# Patient Record
Sex: Female | Born: 1935
Health system: Southern US, Community
[De-identification: ages and names within clinical notes are randomized; demographics above are authoritative.]

## PROBLEM LIST (undated history)

## (undated) DIAGNOSIS — E785 Hyperlipidemia, unspecified: Secondary | ICD-10-CM

## (undated) DIAGNOSIS — M1990 Unspecified osteoarthritis, unspecified site: Secondary | ICD-10-CM

## (undated) DIAGNOSIS — I4891 Unspecified atrial fibrillation: Secondary | ICD-10-CM

## (undated) DIAGNOSIS — D179 Benign lipomatous neoplasm, unspecified: Secondary | ICD-10-CM

## (undated) DIAGNOSIS — M25559 Pain in unspecified hip: Secondary | ICD-10-CM

## (undated) DIAGNOSIS — Z9889 Other specified postprocedural states: Secondary | ICD-10-CM

## (undated) DIAGNOSIS — R011 Cardiac murmur, unspecified: Secondary | ICD-10-CM

## (undated) DIAGNOSIS — J189 Pneumonia, unspecified organism: Secondary | ICD-10-CM

## (undated) DIAGNOSIS — R112 Nausea with vomiting, unspecified: Secondary | ICD-10-CM

## (undated) DIAGNOSIS — C801 Malignant (primary) neoplasm, unspecified: Secondary | ICD-10-CM

## (undated) DIAGNOSIS — C50919 Malignant neoplasm of unspecified site of unspecified female breast: Secondary | ICD-10-CM

## (undated) DIAGNOSIS — R05 Cough: Secondary | ICD-10-CM

## (undated) DIAGNOSIS — M1611 Unilateral primary osteoarthritis, right hip: Secondary | ICD-10-CM

## (undated) DIAGNOSIS — I10 Essential (primary) hypertension: Secondary | ICD-10-CM

## (undated) DIAGNOSIS — M1712 Unilateral primary osteoarthritis, left knee: Secondary | ICD-10-CM

## (undated) DIAGNOSIS — K219 Gastro-esophageal reflux disease without esophagitis: Secondary | ICD-10-CM

## (undated) HISTORY — DX: Benign lipomatous neoplasm, unspecified: D17.9

## (undated) HISTORY — DX: Gastro-esophageal reflux disease without esophagitis: K21.9

## (undated) HISTORY — DX: Unspecified osteoarthritis, unspecified site: M19.90

## (undated) HISTORY — PX: OVARY SURGERY: SHX727

## (undated) HISTORY — PX: TYMPANOPLASTY: SHX33

## (undated) HISTORY — DX: Malignant neoplasm of unspecified site of unspecified female breast: C50.919

## (undated) HISTORY — DX: Hyperlipidemia, unspecified: E78.5

## (undated) HISTORY — PX: KNEE SURGERY: SHX244

## (undated) HISTORY — DX: Malignant (primary) neoplasm, unspecified: C80.1

---
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
## 1999-03-09 HISTORY — PX: EYE SURGERY: SHX253

## 2000-02-08 ENCOUNTER — Ambulatory Visit (HOSPITAL_COMMUNITY): Admission: RE | Admit: 2000-02-08 | Discharge: 2000-02-09 | Payer: Self-pay | Admitting: Ophthalmology

## 2000-02-08 ENCOUNTER — Encounter: Payer: Self-pay | Admitting: Ophthalmology

## 2002-05-25 ENCOUNTER — Other Ambulatory Visit: Admission: RE | Admit: 2002-05-25 | Discharge: 2002-05-25 | Payer: Self-pay | Admitting: *Deleted

## 2002-09-04 ENCOUNTER — Encounter: Payer: Self-pay | Admitting: Neurosurgery

## 2002-09-04 ENCOUNTER — Ambulatory Visit (HOSPITAL_COMMUNITY): Admission: RE | Admit: 2002-09-04 | Discharge: 2002-09-04 | Payer: Self-pay | Admitting: Neurosurgery

## 2002-10-10 ENCOUNTER — Ambulatory Visit (HOSPITAL_COMMUNITY): Admission: RE | Admit: 2002-10-10 | Discharge: 2002-10-10 | Payer: Self-pay | Admitting: *Deleted

## 2002-10-10 ENCOUNTER — Encounter (INDEPENDENT_AMBULATORY_CARE_PROVIDER_SITE_OTHER): Payer: Self-pay

## 2004-04-08 ENCOUNTER — Encounter: Admission: RE | Admit: 2004-04-08 | Discharge: 2004-04-08 | Payer: Self-pay | Admitting: Orthopedic Surgery

## 2004-04-22 ENCOUNTER — Encounter: Admission: RE | Admit: 2004-04-22 | Discharge: 2004-04-22 | Payer: Self-pay | Admitting: Orthopedic Surgery

## 2005-05-12 ENCOUNTER — Other Ambulatory Visit: Admission: RE | Admit: 2005-05-12 | Discharge: 2005-05-12 | Payer: Self-pay | Admitting: *Deleted

## 2007-04-26 ENCOUNTER — Encounter: Admission: RE | Admit: 2007-04-26 | Discharge: 2007-04-26 | Payer: Self-pay | Admitting: Family Medicine

## 2008-02-06 ENCOUNTER — Encounter: Admission: RE | Admit: 2008-02-06 | Discharge: 2008-02-06 | Payer: Self-pay | Admitting: Family Medicine

## 2008-06-14 ENCOUNTER — Other Ambulatory Visit: Admission: RE | Admit: 2008-06-14 | Discharge: 2008-06-14 | Payer: Self-pay | Admitting: Family Medicine

## 2009-03-08 HISTORY — PX: BREAST LUMPECTOMY: SHX2

## 2009-03-11 ENCOUNTER — Encounter: Admission: RE | Admit: 2009-03-11 | Discharge: 2009-03-11 | Payer: Self-pay | Admitting: Family Medicine

## 2009-03-14 ENCOUNTER — Encounter: Admission: RE | Admit: 2009-03-14 | Discharge: 2009-03-14 | Payer: Self-pay | Admitting: Family Medicine

## 2009-03-19 ENCOUNTER — Encounter: Admission: RE | Admit: 2009-03-19 | Discharge: 2009-03-19 | Payer: Self-pay | Admitting: Family Medicine

## 2009-03-25 ENCOUNTER — Encounter: Admission: RE | Admit: 2009-03-25 | Discharge: 2009-03-25 | Payer: Self-pay | Admitting: Family Medicine

## 2009-04-16 ENCOUNTER — Encounter: Payer: Self-pay | Admitting: Surgery

## 2009-04-22 ENCOUNTER — Encounter: Admission: RE | Admit: 2009-04-22 | Discharge: 2009-04-22 | Payer: Self-pay | Admitting: Surgery

## 2009-04-22 ENCOUNTER — Ambulatory Visit (HOSPITAL_COMMUNITY): Admission: RE | Admit: 2009-04-22 | Discharge: 2009-04-22 | Payer: Self-pay | Admitting: Surgery

## 2009-04-22 ENCOUNTER — Other Ambulatory Visit (INDEPENDENT_AMBULATORY_CARE_PROVIDER_SITE_OTHER): Payer: Self-pay | Admitting: Surgery

## 2009-04-22 HISTORY — PX: BREAST SURGERY: SHX581

## 2009-05-06 ENCOUNTER — Ambulatory Visit: Payer: Self-pay | Admitting: Oncology

## 2009-05-13 ENCOUNTER — Ambulatory Visit: Admission: RE | Admit: 2009-05-13 | Discharge: 2009-07-18 | Payer: Self-pay | Admitting: Radiation Oncology

## 2009-06-06 ENCOUNTER — Ambulatory Visit: Payer: Self-pay | Admitting: Oncology

## 2009-08-05 ENCOUNTER — Ambulatory Visit: Payer: Self-pay | Admitting: Oncology

## 2009-08-29 ENCOUNTER — Other Ambulatory Visit: Admission: RE | Admit: 2009-08-29 | Discharge: 2009-08-29 | Payer: Self-pay | Admitting: Family Medicine

## 2010-02-04 ENCOUNTER — Encounter: Admission: RE | Admit: 2010-02-04 | Discharge: 2010-02-04 | Payer: Self-pay | Admitting: Surgery

## 2010-03-12 ENCOUNTER — Encounter
Admission: RE | Admit: 2010-03-12 | Discharge: 2010-03-12 | Payer: Self-pay | Source: Home / Self Care | Attending: Oncology | Admitting: Oncology

## 2010-05-27 LAB — COMPREHENSIVE METABOLIC PANEL
AST: 28 U/L (ref 0–37)
Albumin: 3.9 g/dL (ref 3.5–5.2)
Alkaline Phosphatase: 57 U/L (ref 39–117)
BUN: 10 mg/dL (ref 6–23)
Calcium: 9 mg/dL (ref 8.4–10.5)
Chloride: 101 mEq/L (ref 96–112)
Creatinine, Ser: 0.6 mg/dL (ref 0.4–1.2)
GFR calc non Af Amer: >60 mL/min (ref >60)
Potassium: 3.2 mEq/L — ABNORMAL LOW (ref 3.5–5.1)
Sodium: 137 mEq/L (ref 135–145)

## 2010-05-27 LAB — CBC
HCT: 35.6 % — ABNORMAL LOW (ref 36.0–46.0)
Hemoglobin: 12.5 g/dL (ref 12.0–15.0)
MCHC: 35.2 g/dL (ref 30.0–36.0)
RBC: 4.02 MIL/uL (ref 3.87–5.11)

## 2010-05-27 LAB — DIFFERENTIAL
Lymphocytes Relative: 42 % (ref 12–46)
Neutro Abs: 3.8 10*3/uL (ref 1.7–7.7)

## 2010-07-08 ENCOUNTER — Other Ambulatory Visit: Payer: Self-pay | Admitting: Oncology

## 2010-07-08 ENCOUNTER — Encounter (HOSPITAL_BASED_OUTPATIENT_CLINIC_OR_DEPARTMENT_OTHER): Payer: Medicare Other | Admitting: Oncology

## 2010-07-08 DIAGNOSIS — C50519 Malignant neoplasm of lower-outer quadrant of unspecified female breast: Secondary | ICD-10-CM

## 2010-07-08 DIAGNOSIS — Z170 Estrogen receptor positive status [ER+]: Secondary | ICD-10-CM

## 2010-07-08 LAB — CBC WITH DIFFERENTIAL/PLATELET
HCT: 37.8 % (ref 34.8–46.6)
HGB: 13 g/dL (ref 11.6–15.9)
LYMPH%: 46 % (ref 14.0–49.7)
MCH: 30.3 pg (ref 25.1–34.0)
MCHC: 34.4 g/dL (ref 31.5–36.0)
MCV: 88.2 fL (ref 79.5–101.0)
MONO%: 8.9 % (ref 0.0–14.0)
Platelets: 222 10*3/uL (ref 145–400)
RBC: 4.29 10*6/uL (ref 3.70–5.45)
RDW: 12.9 % (ref 11.2–14.5)

## 2010-07-09 LAB — COMPREHENSIVE METABOLIC PANEL
ALT: 29 U/L (ref 0–35)
Albumin: 4.4 g/dL (ref 3.5–5.2)
CO2: 26 mEq/L (ref 19–32)
Calcium: 9.8 mg/dL (ref 8.4–10.5)
Chloride: 103 mEq/L (ref 96–112)
Creatinine, Ser: 0.56 mg/dL (ref 0.40–1.20)
Glucose, Bld: 114 mg/dL — ABNORMAL HIGH (ref 70–99)
Potassium: 3.6 mEq/L (ref 3.5–5.3)
Sodium: 141 mEq/L (ref 135–145)
Total Bilirubin: 1.2 mg/dL (ref 0.3–1.2)

## 2010-07-09 LAB — VITAMIN D 25 HYDROXY (VIT D DEFICIENCY, FRACTURES): Vit D, 25-Hydroxy: 22 ng/mL — ABNORMAL LOW (ref 30–89)

## 2010-07-15 ENCOUNTER — Other Ambulatory Visit: Payer: Self-pay | Admitting: Oncology

## 2010-07-15 ENCOUNTER — Encounter (HOSPITAL_BASED_OUTPATIENT_CLINIC_OR_DEPARTMENT_OTHER): Payer: Medicare Other | Admitting: Oncology

## 2010-07-15 DIAGNOSIS — Z9889 Other specified postprocedural states: Secondary | ICD-10-CM

## 2010-07-15 DIAGNOSIS — C50519 Malignant neoplasm of lower-outer quadrant of unspecified female breast: Secondary | ICD-10-CM

## 2010-07-15 DIAGNOSIS — C50911 Malignant neoplasm of unspecified site of right female breast: Secondary | ICD-10-CM

## 2010-07-24 NOTE — Op Note (Signed)
[place]  Patient:    Sheila Gardner, Sheila Gardner                        MRN: [id number] Proc. Date: 02/08/00 Adm. Date:  [id number] Attending:  Ladona Ridgel                           Operative Report  PREOPERATIVE DIAGNOSIS:  Idiopathic macular hole, right eye.  POSTOPERATIVE DIAGNOSIS:  Idiopathic macular hole, right eye.  PROCEDURE: 1. Pars plana vitrectomy and membrane peeling. 2. Autologous serum patch. 3. Gas-fluid exchange with 16% C3F8 prophylactic. 4. Laser for retinal break, right eye.  SURGEON:  Ladona Ridgel, M.D.  ASSISTANT:  Ulysees Barns.  ANESTHESIA:  General endotracheal.  ESTIMATED BLOOD LOSS:  Less than 1 cc.  COMPLICATIONS:  None.  DESCRIPTION OF PROCEDURE:  The patient was taken to the operating room, where after induction of general anesthesia, the right eye was prepped and draped in the usual fashion.  A lid speculum was introduced and conjunctival peritomy was developed temporally and supranasally with relaxing incisions at 9 and 1. Hemostasis was obtained with eraser cautery.  Sclerotomies were fashioned at 3.75 mm posterior to the limbus at 1:30, 10:30 and 8.  The superior sclerotomies were plugged and a 4 mm infusion cannula was secured at the 8 oclock sclerotomy with a temporary suture of 7-0 Vicryl.  The tip was visually inspected and found to be in good position.  Landers ring was secured to the globe with 7-0 Vicryl sutures at 3 and 9.  The plugs were removed and a 30-degree Prismatic lens was applied to the surface of the eye.  A central core, followed by a peripheral vitrectomy was performed.  There was no posterior eyelid separation.  Using the suction only mode, the posterior hyaline was engaged and stripped from the posterior segment.  The peripheral vitrectomy was then completed at 360 degrees.  Magnifying prismatic lens was applied to the surface of the eye.  After verification that the  posterior hyaline had been stripped, the MVR was used to make a small opening to the internal lamellar membrane superiorly and temporally to the fovea.  The edge of the membrane was then elevated with Rice pick, and then peeled off the macula and rectus; the membrane being removed in two fragments.  Faint retinal whitening was seen 360 degrees surrounding the small central macular hole. The instrument was removed from the eye and the hole was plugged.  Landers ring and lens were removed.  Inspection with the indirect ophthalmoscope with scleral depression revealed the retina to be flat, with a small, cystic, tufted area of potential retinal break approximately at 3 oclock.  This was surrounded with the indirect laser.  A gas-fluid exchange was then performed with #4 ______ ______ catheter and then head scope for visualization.  The superior and temporal sclerotomy was then replugged.  The autologous serum patch was drawn through the usual fashion, passing through a 0.22 micron filter, followed by a filter needle into a 1 cc syringe for later use.  The gas was drawn up in the usual sterile fashion, diluted to approximately 165 CF38.  A biconcave lens was then reapplied to the surface of the eye and the fluid then filled and the posterior segment was removed.  Two drops of paste autologous serum were then placed over the macula.  The supranasal sclerotomy and its  perimetry was closed.  Then 20 cc of the gas was infused with a fusion cannula, with egress to the supratemporal sclerotomy.  The supratemporal sclerotomy was closed.  The pressure was checked at approximately 21 mmHg. The Barraquer tonometer and infusion cannula were removed.  The preplaced sutures were secured.  Pressure was rechecked and found to be unchanged.  The conjuctivae was then drawn up and reapproximated with interrupted running sutures of 6-0 plain gut.  The subconjunctival space was irrigated with 0.75% Marcaine,  followed by subconjunctival injection of 100 mg ceftazidime and 10 mg Decadron.  The lid speculum was then removed and next the antibiotic ointment applied to surface of the eye.  Eye patch and shield were next placed over the patients right eye.  Following awakening from general anesthesia, the patient left the operating room in stable condition DD:  02/08/00 TD:  02/09/00 Job: [id number]

## 2010-08-20 ENCOUNTER — Ambulatory Visit
Admission: RE | Admit: 2010-08-20 | Discharge: 2010-08-20 | Disposition: A | Discharge status: Home / Self Care | Payer: Medicare Other | Source: Ambulatory Visit | Attending: Gastroenterology | Admitting: Gastroenterology

## 2010-08-20 ENCOUNTER — Other Ambulatory Visit: Payer: Self-pay | Admitting: Gastroenterology

## 2010-08-20 DIAGNOSIS — M545 Low back pain, unspecified: Secondary | ICD-10-CM

## 2010-08-24 ENCOUNTER — Encounter (HOSPITAL_BASED_OUTPATIENT_CLINIC_OR_DEPARTMENT_OTHER): Payer: Medicare Other | Admitting: Oncology

## 2010-08-24 DIAGNOSIS — M810 Age-related osteoporosis without current pathological fracture: Secondary | ICD-10-CM

## 2010-08-24 DIAGNOSIS — Z170 Estrogen receptor positive status [ER+]: Secondary | ICD-10-CM

## 2010-08-24 DIAGNOSIS — C50919 Malignant neoplasm of unspecified site of unspecified female breast: Secondary | ICD-10-CM

## 2010-10-27 ENCOUNTER — Other Ambulatory Visit: Payer: Self-pay | Admitting: Physician Assistant

## 2010-12-28 ENCOUNTER — Encounter (INDEPENDENT_AMBULATORY_CARE_PROVIDER_SITE_OTHER): Payer: Self-pay | Admitting: Surgery

## 2010-12-30 ENCOUNTER — Encounter (INDEPENDENT_AMBULATORY_CARE_PROVIDER_SITE_OTHER): Payer: Self-pay | Admitting: Surgery

## 2010-12-30 ENCOUNTER — Ambulatory Visit (INDEPENDENT_AMBULATORY_CARE_PROVIDER_SITE_OTHER): Payer: Medicare Other | Admitting: Surgery

## 2010-12-30 VITALS — BP 150/90 | HR 64 | Temp 97.1°F | Resp 16 | Ht 64.5 in | Wt 188.4 lb

## 2010-12-30 DIAGNOSIS — C50411 Malignant neoplasm of upper-outer quadrant of right female breast: Secondary | ICD-10-CM | POA: Insufficient documentation

## 2010-12-30 DIAGNOSIS — Z853 Personal history of malignant neoplasm of breast: Secondary | ICD-10-CM | POA: Insufficient documentation

## 2010-12-30 NOTE — Progress Notes (Signed)
ASSESSMENT AND PLAN: 1.  Right breast cancer  T1, N0  Lumpectomy 04/22/2009.  No evidence of disease.  To see me back in 6 months.  2.  On Femara, which she is doing well with.  (She tried Arimidex, but could not tolerate it.) 3.  History of cardiomegaly by echo.  Stable cardiac status  HISTORY OF PRESENT ILLNESS: Chief Complaint  Patient presents with  . Breast Cancer Long Term Follow Up    Sheila Gardner is a 75 y.o. (DOB: [birth year])  white female who is a patient of Daisy Floro, MD and comes to me today for follow up breast cancer.  She spends a lot of time with her grand children.  IN good spirits.  No new mass or lump.  She is doing well with no complaints.  Doing well on the Femara.  She says she can't tell that she is taking it.  Path (side, TNM): Right, T1, N0. Surgery: Right lumpectomy/SLNBx   Date: 04/22/2009 Size of tumor: 0.8 cm Nodes: 0/3 ER: 100% PR: 100% Ki67: 10%  HER2Neu: Neg  Medical Oncologist: Magrinat  Radiation Oncologist: Michell Heinrich  (she did no undergo rad tx)  PHYSICAL EXAM: BP 150/90  Pulse 64  Temp(Src) 97.1 F (36.2 C) (Temporal)  Resp 16  Ht 5' 4.5" (1.638 m)  Wt 188 lb 6.4 oz (85.458 kg)  BMI 31.84 kg/m2  HEENT:  Pupils equal.  Dentition good.  No injury. NECK:  Supple.  No thyroid mass. LYMPH NODES:  No cervical, supraclavicular, or axillary adenopathy. BREASTS -  RIGHT:  Indention 7 to 8 o'clock position from lumpectomy.  No mass.   No nipple discharge.   LEFT:  No palpable mass or nodule.  No nipple discharge. UPPER EXTREMITIES:  No evidence of lymphedema.  DATA REVIEWED: Mammogram - 03/12/2010

## 2011-01-14 ENCOUNTER — Telehealth: Payer: Self-pay | Admitting: Oncology

## 2011-01-14 NOTE — Telephone Encounter (Signed)
called pt and informed her that her appt in dec2012 was cancelled.  r/s appt 03/19/2011 and 03/25/2011

## 2011-03-09 HISTORY — PX: ANKLE FRACTURE SURGERY: SHX122

## 2011-03-17 ENCOUNTER — Other Ambulatory Visit: Payer: Self-pay | Admitting: Oncology

## 2011-03-17 DIAGNOSIS — Z853 Personal history of malignant neoplasm of breast: Secondary | ICD-10-CM

## 2011-03-18 ENCOUNTER — Ambulatory Visit
Admission: RE | Admit: 2011-03-18 | Discharge: 2011-03-18 | Disposition: A | Discharge status: Home / Self Care | Payer: Medicare Other | Source: Ambulatory Visit | Attending: Oncology | Admitting: Oncology

## 2011-03-18 DIAGNOSIS — C50911 Malignant neoplasm of unspecified site of right female breast: Secondary | ICD-10-CM

## 2011-03-18 DIAGNOSIS — Z853 Personal history of malignant neoplasm of breast: Secondary | ICD-10-CM | POA: Diagnosis not present

## 2011-03-19 ENCOUNTER — Other Ambulatory Visit: Payer: Self-pay | Admitting: Oncology

## 2011-03-19 ENCOUNTER — Other Ambulatory Visit (HOSPITAL_BASED_OUTPATIENT_CLINIC_OR_DEPARTMENT_OTHER): Payer: Medicare Other | Admitting: Lab

## 2011-03-19 DIAGNOSIS — M810 Age-related osteoporosis without current pathological fracture: Secondary | ICD-10-CM | POA: Diagnosis not present

## 2011-03-19 DIAGNOSIS — C50519 Malignant neoplasm of lower-outer quadrant of unspecified female breast: Secondary | ICD-10-CM

## 2011-03-19 DIAGNOSIS — M205X9 Other deformities of toe(s) (acquired), unspecified foot: Secondary | ICD-10-CM | POA: Diagnosis not present

## 2011-03-19 DIAGNOSIS — Z170 Estrogen receptor positive status [ER+]: Secondary | ICD-10-CM

## 2011-03-19 DIAGNOSIS — E559 Vitamin D deficiency, unspecified: Secondary | ICD-10-CM | POA: Diagnosis not present

## 2011-03-19 LAB — CBC WITH DIFFERENTIAL/PLATELET
Basophils Absolute: 0.1 10*3/uL (ref 0.0–0.1)
HCT: 36.6 % (ref 34.8–46.6)
HGB: 12.6 g/dL (ref 11.6–15.9)
LYMPH%: 40.7 % (ref 14.0–49.7)
MCH: 30.3 pg (ref 25.1–34.0)
MCHC: 34.3 g/dL (ref 31.5–36.0)
MONO#: 0.6 10*3/uL (ref 0.1–0.9)
NEUT%: 47 % (ref 38.4–76.8)
Platelets: 255 10*3/uL (ref 145–400)
lymph#: 2.8 10*3/uL (ref 0.9–3.3)

## 2011-03-20 LAB — COMPREHENSIVE METABOLIC PANEL
ALT: 54 U/L — ABNORMAL HIGH (ref 0–35)
AST: 73 U/L — ABNORMAL HIGH (ref 0–37)
CO2: 28 mEq/L (ref 19–32)
Calcium: 9.8 mg/dL (ref 8.4–10.5)
Chloride: 102 mEq/L (ref 96–112)
Creatinine, Ser: 0.67 mg/dL (ref 0.50–1.10)
Sodium: 139 mEq/L (ref 135–145)
Total Protein: 7.3 g/dL (ref 6.0–8.3)

## 2011-03-22 ENCOUNTER — Ambulatory Visit
Admission: RE | Admit: 2011-03-22 | Discharge: 2011-03-22 | Disposition: A | Discharge status: Home / Self Care | Payer: Medicare Other | Source: Ambulatory Visit | Attending: Oncology | Admitting: Oncology

## 2011-03-22 DIAGNOSIS — Z853 Personal history of malignant neoplasm of breast: Secondary | ICD-10-CM | POA: Diagnosis not present

## 2011-03-22 MED ORDER — GADOBENATE DIMEGLUMINE 529 MG/ML IV SOLN
17.0000 mL | Freq: Once | INTRAVENOUS | Status: AC | PRN
  Administered 2011-03-22: 17 mL via INTRAVENOUS

## 2011-03-25 ENCOUNTER — Encounter: Payer: Self-pay | Admitting: Physician Assistant

## 2011-03-25 ENCOUNTER — Ambulatory Visit (HOSPITAL_BASED_OUTPATIENT_CLINIC_OR_DEPARTMENT_OTHER): Payer: Medicare Other | Admitting: Physician Assistant

## 2011-03-25 ENCOUNTER — Telehealth: Payer: Self-pay | Admitting: Oncology

## 2011-03-25 VITALS — BP 168/84 | HR 66 | Ht 64.0 in | Wt 185.5 lb

## 2011-03-25 DIAGNOSIS — R7989 Other specified abnormal findings of blood chemistry: Secondary | ICD-10-CM

## 2011-03-25 DIAGNOSIS — Z853 Personal history of malignant neoplasm of breast: Secondary | ICD-10-CM | POA: Diagnosis not present

## 2011-03-25 DIAGNOSIS — E559 Vitamin D deficiency, unspecified: Secondary | ICD-10-CM | POA: Insufficient documentation

## 2011-03-25 NOTE — Progress Notes (Signed)
Hematology and Oncology Follow Up Visit  Sheila Gardner [id number] 05-Feb-1936 76 y.o. 03/25/2011    HPI: Sheila Gardner had her routine screening mammography March 11, 2009 at [facility]. This showed a possible mass in the right breast.  She was recalled for additional studies of the right breast on January 7th.  This confirmed a small rounded mass with ill-defined margins, deep in the right breast, which was not palpable by Dr. Azucena Kuba.  Ultrasound showed it to be 8 mm macrolobulated and near a normal-appearing lymph node in the inferior axillary region.  The patient was brought back for ultrasound-guided biopsy on January 12th and this showed ([id number]) an invasive ductal carcinoma which appeared low grade.  The tumor was estrogen and progesterone receptor positive, both at 100% and it had a low MIB-1 at 10%.  There was no evidence of Her-2 amplification with a ratio by CISH of 1.16.    With this information, the patient was referred to Dr. Ezzard Standing and bilateral breast MRIs were obtained January 18th. They showed a solitary area of enhancement in the right breast, measuring up to 1.7 cm.  Otherwise, this was a negative study.  On February 16th, the patient underwent a right lumpectomy and sentinel lymph node biopsy and this showed ([id number]) an invasive ductal carcinoma which measured 8 mm with negative and ample margins and no lymphovascular invasion.  The tumor was Grade 1 and 0 of 3 sentinel lymph nodes were involved.    The patient decided against radiation therapy and will start on anastrozole in April 2011. She continued on anastrozole until may of 2012 and was discontinued secondary to joint pain. On letrozole since May of 2012, but better tolerance.    Interim History:   Sheila Gardner returns today accompanied by her husband for routine followup of her right breast carcinoma. Interval history is unremarkable and she's been doing quite well. They had a good holiday with her family, including their  13 grandchildren.  Physically Sheila Gardner really has very few complaints. Her biggest complaint is some increased fatigue. She denies any recent illnesses and has had no fevers, chills, or night sweats. She continues to tolerate the letrozole well. Chest some occasional joint pain, primarily in her knees, but this is definitely improved since discontinuing the anastrozole last year. She has no significant hot flashes. No problems with vaginal dryness or vaginal bleeding.  Sheila Gardner is up-to-date with her health maintenance including screening colonoscopy in 2012, and routine physical.  A detailed review of systems is otherwise noncontributory as noted below.  Review of Systems: Constitutional:  Fatigue, no weight loss, fever, night sweats Eyes: negative [id number] Cardiovascular: no chest pain or dyspnea on exertion Respiratory: no cough, shortness of breath, or wheezing Neurological: negative Dermatological: negative Gastrointestinal: no abdominal pain, change in bowel habits, or black or bloody stools Genito-Urinary: no dysuria, trouble voiding, or hematuria Hematological and Lymphatic: negative Breast: negative Musculoskeletal: positive for - joint pain, mild in lower extremities Remaining ROS negative.  PAST MEDICAL HISTORY:  Significant for history of diverticulosis, history of hypercholesterolemia, history of occasional to frequent headaches which by her description are not migraines, history of remote removal of a thyroid mass, history of removal of a colon polyp August of 2004 under Dr. Roosvelt Harps.  History of degenerative disk disease particularly involving the neck.  History of eardrum replacement about 39 years ago.  History of lens implants for cataracts under Blima Ledger.  History of partial ovarian resection bilaterally in 1965.  She had repair  of a macular hole in the right eye in December of 2001 under Jonita Albee Harriott.    FAMILY HISTORY:  The patient's father died at the age  of 15 with dementia.  The patient's mother died at the age of 35 with heart disease.  The patient had one sister who died at the age of one year from a pulmonary problem, and a sister who died in an automobile accident at age 47.  A third sister survives at age 47.  The patient had 11 brothers.  One had pancreatic cancer and two had "brain cancer".  There is no breast or ovarian cancer in the immediate family, although there are some cousins with breast cancer, none diagnosed before the age of 39.    GYN HISTORY:  She is GX P2.  First pregnancy to term at age 73.  Went through the change of life at age 66.  She never took hormone replacement.    SOCIAL HISTORY:  She and her husband Baldo Ash, who is present today, run a Catering manager.  He has also done some construction work and her two sons, Vincenza Hews, 35, and Sheria Lang, who I believe is 9, are both in the building trade.  They both live in [place].  They have a daughter, Albin Felling, who helps with the Tupperware business and a daughter, Celine Mans, who works in [employer]. The patient has 13 grandchildren.  She attends the [employer].    Medications:   I have reviewed the patient's current medications.  Current Outpatient Prescriptions  Medication Sig Dispense Refill  . ASPIRIN PO Take by mouth as needed.        . cholecalciferol (VITAMIN D) 400 UNITS TABS Take 2,000 Units by mouth daily.       . Cyanocobalamin (VITAMIN B-12 PO) Take by mouth.        . cyclobenzaprine (FLEXERIL) 10 MG tablet Ad lib.      . fenofibrate 160 MG tablet 160 mg daily.       Marland Kitchen FLUOXETINE HCL PO Take 10 mg by mouth daily.        Marland Kitchen letrozole (FEMARA) 2.5 MG tablet daily.      Marland Kitchen omeprazole (PRILOSEC) 20 MG capsule daily.      . Pyridoxine HCl (VITAMIN B6 PO) Take by mouth.        . traMADol (ULTRAM) 50 MG tablet Ad lib.      Marland Kitchen FLAXSEED, LINSEED, PO Take by mouth.          Allergies: No Known Allergies  Physical Exam: Filed Vitals:   03/25/11 1117  BP: 168/84    Pulse: 66   HEENT:  Sclerae anicteric, conjunctivae pink.  Oropharynx clear.  No mucositis or candidiasis.   Nodes:  No cervical, supraclavicular, or axillary lymphadenopathy palpated.  Breast Exam:  Right breast, status post lumpectomy with well-healed incision. No suspicious nodularity, skin changes, or evidence of local recurrence. Left breast is benign with no masses, skin changes, or nipple inversion. Lungs:  Clear to auscultation bilaterally.  No crackles, rhonchi, or wheezes.   Heart:  Regular rate and rhythm.   Abdomen:  Soft, nontender.  Positive bowel sounds.  No organomegaly or masses palpated.   Musculoskeletal:  No focal spinal tenderness to palpation.  Extremities:  Benign.  No peripheral edema or cyanosis.   Skin:  Benign.   Neuro:  Nonfocal.   Lab Results: Lab Results  Component Value Date   WBC 6.8 03/19/2011   HGB 12.6 03/19/2011  HCT 36.6 03/19/2011   MCV 88.3 03/19/2011   PLT 255 03/19/2011   NEUTROABS 3.2 03/19/2011     Chemistry      Component Value Date/Time   NA 139 03/19/2011 1337   NA 139 03/19/2011 1337   K 3.6 03/19/2011 1337   K 3.6 03/19/2011 1337   CL 102 03/19/2011 1337   CL 102 03/19/2011 1337   CO2 28 03/19/2011 1337   CO2 28 03/19/2011 1337   BUN 14 03/19/2011 1337   BUN 14 03/19/2011 1337   CREATININE 0.67 03/19/2011 1337   CREATININE 0.67 03/19/2011 1337      Component Value Date/Time   CALCIUM 9.8 03/19/2011 1337   CALCIUM 9.8 03/19/2011 1337   ALKPHOS 60 03/19/2011 1337   ALKPHOS 60 03/19/2011 1337   AST 73* 03/19/2011 1337   AST 73* 03/19/2011 1337   ALT 54* 03/19/2011 1337   ALT 54* 03/19/2011 1337   BILITOT 0.8 03/19/2011 1337   BILITOT 0.8 03/19/2011 1337        Radiological Studies:  Mr Breast Bilateral W Wo Contrast  03/22/2011  *RADIOLOGY REPORT*  Clinical Data: 76 year old female with history of right breast cancer, lumpectomy and treatment in 2011.  Follow-up.  BILATERAL BREAST MRI WITH AND WITHOUT CONTRAST  Technique: Multiplanar,  multisequence MR images of both breasts were obtained prior to and following the intravenous administration of 17ml of Multihance.  Three dimensional images were evaluated at the independent DynaCad workstation.  Comparison:  [facility] and 03/25/2009 breast MRI  Findings: Mild normal background parenchymal enhancement is identified.  There are no abnormal areas enhancement within either breast. No abnormal appearing or enlarged lymph nodes are present.  Mild scarring changes within the outer right breast are identified.  No bony or upper hepatic abnormalities are present. Mild cardiomegaly is again noted.  IMPRESSION: No MR evidence of breast malignancy or metastatic disease.  Right breast scarring.  THREE-DIMENSIONAL MR IMAGE RENDERING ON INDEPENDENT WORKSTATION:  Three-dimensional MR images were rendered by post-processing of the original MR data on an independent workstation.  The three- dimensional MR images were interpreted, and findings were reported in the accompanying complete MRI report for this study.  BI-RADS CATEGORY 2:  Benign finding(s).  Recommend bilateral screening mammograms in 1 year. Consider bilateral breast MRI in 1 year as clinically indicated.  Original Report Authenticated By: Rosendo Gros, M.D.   Mm Digital Diagnostic Bilat  03/18/2011  ** RADIOLOGY REPORT **  Clinical Data:  76 year old female with history of right breast cancer, lumpectomy and treatment in 2011.  DIGITAL DIAGNOSTIC BILATERAL MAMMOGRAM with CAD  Comparison: 03/12/2010 and prior mammograms dating back to 04/26/2007  Findings: MLO and CC views of both breasts and a spot tangential view of the lumpectomy site performed. Minimal scarring in the right breast again identified. There is no evidence of mass, nonsurgical distortion, or suspicious calcifications. Scattered fibroglandular densities bilaterally are again noted.  Mammographic images were processed with CAD.  IMPRESSION: No specific  mammographic evidence of malignancy.  Right breast scarring.  These findings were discussed with the patient.  She was encouraged to begin/continue monthly self exams and to contact her primary physician if any changes noted.  BI-RADS CATEGORY 2:  Benign finding(s).  Recommend bilateral diagnostic mammograms in 1 year.  Original Report Authenticated By: Rosendo Gros, M.D.     Assessment:  A 76 year old [place] woman   (1)  status post right lumpectomy and sentinel lymph node dissection in  February 2011 for a T1b N0, grade 1 invasive ductal carcinoma.  Strongly ER/PR positive, HER2/neu negative, with low MIB-1.    (2)  Decided against radiation therapy.    (3)  On anastrozole from April 2011 until May 2012 with fair tolerance, but notable arthralgias when on the medication.  Now on letrozole since May 2012 with better tolerance.   Plan:  This case has been reviewed with Dr. Darnelle Catalan. Fortunately Darl Pikes recent mammogram and breast MRIs were all unremarkable. She will have her annual mammogram in 1 year, and is due for her next bone density test (which incidentally was normal in June of 2011) repeated in June of 2013.  We did notice, however, there was a significant increase in liver enzymes, up from 29 at her last visit here, to an AST of 73 and an ALT of 54 on 03/19/2011. She's had no previous liver imaging. The patient is extremely anxious about this increase, and is extremely concerned about her "cancer is growing". I did reassure her, but we will schedule her for a liver MRI in the next week or so, and I will see her sooner after to review those results.  Otherwise she will have her bone density in June as noted above, and we'll see Dr. Darnelle Catalan for her routine followup, including repeat labs, in July 2013. She'll continue on the letrozole which she is tolerating well.   This plan was reviewed with the patient, who voices understanding and agreement.  She knows to call with any changes or  problems.    Sora Vrooman, PA-C 03/25/2011

## 2011-03-25 NOTE — Telephone Encounter (Signed)
gve the pt her bone density/mri appt along with the jan,july 2013 appt calendars.

## 2011-03-28 ENCOUNTER — Other Ambulatory Visit: Payer: Self-pay | Admitting: Physician Assistant

## 2011-03-28 ENCOUNTER — Ambulatory Visit (HOSPITAL_COMMUNITY)
Admission: RE | Admit: 2011-03-28 | Discharge: 2011-03-28 | Disposition: A | Discharge status: Home / Self Care | Payer: Medicare Other | Source: Ambulatory Visit | Attending: Physician Assistant | Admitting: Physician Assistant

## 2011-03-28 DIAGNOSIS — Z853 Personal history of malignant neoplasm of breast: Secondary | ICD-10-CM | POA: Insufficient documentation

## 2011-03-28 DIAGNOSIS — R748 Abnormal levels of other serum enzymes: Secondary | ICD-10-CM | POA: Diagnosis not present

## 2011-03-28 DIAGNOSIS — K8020 Calculus of gallbladder without cholecystitis without obstruction: Secondary | ICD-10-CM | POA: Diagnosis not present

## 2011-03-28 DIAGNOSIS — Q619 Cystic kidney disease, unspecified: Secondary | ICD-10-CM | POA: Insufficient documentation

## 2011-03-28 DIAGNOSIS — K7689 Other specified diseases of liver: Secondary | ICD-10-CM | POA: Insufficient documentation

## 2011-03-28 DIAGNOSIS — N281 Cyst of kidney, acquired: Secondary | ICD-10-CM | POA: Diagnosis not present

## 2011-03-28 MED ORDER — GADOBENATE DIMEGLUMINE 529 MG/ML IV SOLN
17.0000 mL | Freq: Once | INTRAVENOUS | Status: AC | PRN
  Administered 2011-03-28: 17 mL via INTRAVENOUS

## 2011-03-30 ENCOUNTER — Telehealth: Payer: Self-pay | Admitting: Physician Assistant

## 2011-03-30 NOTE — Telephone Encounter (Signed)
Spoke with the patient today by phone to review results of recent MRI of the abdomen/liver. Reviewed the fact that there was no evidence of hepatic metastasis but there was hepatic steatosis. Also incidental findings of cholelithiasis which are asymptomatic.  Patient will return for routine followup in July 2013, but knows to call prior to that time with any changes. She does voice her understanding of the above during our conversation today.

## 2011-03-31 ENCOUNTER — Ambulatory Visit: Payer: Medicare Other | Admitting: Physician Assistant

## 2011-04-20 ENCOUNTER — Other Ambulatory Visit: Payer: Self-pay | Admitting: Dermatology

## 2011-04-20 DIAGNOSIS — D239 Other benign neoplasm of skin, unspecified: Secondary | ICD-10-CM | POA: Diagnosis not present

## 2011-04-20 DIAGNOSIS — D179 Benign lipomatous neoplasm, unspecified: Secondary | ICD-10-CM | POA: Diagnosis not present

## 2011-04-20 DIAGNOSIS — L821 Other seborrheic keratosis: Secondary | ICD-10-CM | POA: Diagnosis not present

## 2011-04-20 DIAGNOSIS — D485 Neoplasm of uncertain behavior of skin: Secondary | ICD-10-CM | POA: Diagnosis not present

## 2011-05-17 ENCOUNTER — Encounter (INDEPENDENT_AMBULATORY_CARE_PROVIDER_SITE_OTHER): Payer: Self-pay | Admitting: Surgery

## 2011-05-27 DIAGNOSIS — N899 Noninflammatory disorder of vagina, unspecified: Secondary | ICD-10-CM | POA: Diagnosis not present

## 2011-07-22 ENCOUNTER — Ambulatory Visit (INDEPENDENT_AMBULATORY_CARE_PROVIDER_SITE_OTHER): Payer: Medicare Other | Admitting: Surgery

## 2011-07-22 ENCOUNTER — Encounter (INDEPENDENT_AMBULATORY_CARE_PROVIDER_SITE_OTHER): Payer: Self-pay | Admitting: Surgery

## 2011-07-22 VITALS — BP 128/80 | HR 78 | Temp 97.6°F | Resp 16 | Ht 64.5 in | Wt 190.0 lb

## 2011-07-22 DIAGNOSIS — Z853 Personal history of malignant neoplasm of breast: Secondary | ICD-10-CM

## 2011-07-22 NOTE — Progress Notes (Signed)
Name:  Sheila Gardner MRN:  [id number] Date of birth: [birth year]  ASSESSMENT AND PLAN: 1.  Right breast cancer  T1, N0  Lumpectomy 04/22/2009.  No evidence of disease.    To see me back in 3 months to recheck her neck.  Then I will decide when to see her back for her breast.  2.  On Femara, which she is doing well with.  (She tried Arimidex, but could not tolerate it.) 3.  History of cardiomegaly by echo.  Stable cardiac status. 4.  Small mass, posterior right neck.  Probable lymph node.  (2.1 x 0.6 cm)  I did an [place] and took pictures.  She wants to see me back in 3 months to recheck this.  I will re ultrasound it at that time. 5.  Mildly elevated LFT's.  She's going to check with Dr. Tenny Craw.  She has a copy of her labs.  HISTORY OF PRESENT ILLNESS: Chief Complaint  Patient presents with  . Breast Cancer Long Term Follow Up    Br Recheck  R. Breast DOS:04/22/2009     Sheila Gardner is a 76 y.o. (DOB: [birth year])  white female who is a patient of Daisy Floro, MD, MD and comes to me today for follow up breast cancer.  She spends a lot of time with her grand children.  Doing well on the Femara.  She says she can't tell that she is taking it.  She had questions about her LFT's.  She has a mildly elevated AST and ALT.  I gave her copies of her reports.  And showed me a nodule on her right posterior neck.  She says she has had this mass for several years.  She is unsure whether it has enlarged.  Path (side, TNM): Right, T1, N0. Surgery: Right lumpectomy/SLNBx   Date: 04/22/2009 Size of tumor: 0.8 cm Nodes: 0/3 ER: 100% PR: 100% Ki67: 10%  HER2Neu: Neg  Medical Oncologist: Magrinat  Radiation Oncologist: Michell Heinrich  (she did not undergo rad tx)  PHYSICAL EXAM: BP 128/80  Pulse 78  Temp(Src) 97.6 F (36.4 C) (Temporal)  Resp 16  Ht 5' 4.5" (1.638 m)  Wt 190 lb (86.183 kg)  BMI 32.11 kg/m2  HEENT:  Pupils equal.  Dentition good.  No injury. NECK:  Supple.  No thyroid mass. She  has a 2 cm mass at the posterior base of her neck.  I don't think that she has showed this to me before, though she said she has had it several years. LYMPH NODES:  No cervical, supraclavicular, or axillary adenopathy.  Though I am not sure of the mass at the base of her right neck. BREASTS -  RIGHT:  Indention 7 to 8 o'clock position from lumpectomy.  No mass.   No nipple discharge.   LEFT:  No palpable mass or nodule.  No nipple discharge. UPPER EXTREMITIES:  No evidence of lymphedema.  PROCEDURE:  [place] of right posterior neck.  2.1 x 0.6 benign appearing mass, probable lymph node.  Pictures taken.  DATA REVIEWED: Breast MRI - 03/17/2011

## 2011-07-30 DIAGNOSIS — E782 Mixed hyperlipidemia: Secondary | ICD-10-CM | POA: Diagnosis not present

## 2011-07-30 DIAGNOSIS — G609 Hereditary and idiopathic neuropathy, unspecified: Secondary | ICD-10-CM | POA: Diagnosis not present

## 2011-07-30 DIAGNOSIS — F3289 Other specified depressive episodes: Secondary | ICD-10-CM | POA: Diagnosis not present

## 2011-07-30 DIAGNOSIS — F329 Major depressive disorder, single episode, unspecified: Secondary | ICD-10-CM | POA: Diagnosis not present

## 2011-07-30 DIAGNOSIS — Z79899 Other long term (current) drug therapy: Secondary | ICD-10-CM | POA: Diagnosis not present

## 2011-08-03 DIAGNOSIS — E782 Mixed hyperlipidemia: Secondary | ICD-10-CM | POA: Diagnosis not present

## 2011-08-03 DIAGNOSIS — Z79899 Other long term (current) drug therapy: Secondary | ICD-10-CM | POA: Diagnosis not present

## 2011-08-03 DIAGNOSIS — F329 Major depressive disorder, single episode, unspecified: Secondary | ICD-10-CM | POA: Diagnosis not present

## 2011-08-03 DIAGNOSIS — F3289 Other specified depressive episodes: Secondary | ICD-10-CM | POA: Diagnosis not present

## 2011-08-03 DIAGNOSIS — G5600 Carpal tunnel syndrome, unspecified upper limb: Secondary | ICD-10-CM | POA: Diagnosis not present

## 2011-08-09 DIAGNOSIS — Z853 Personal history of malignant neoplasm of breast: Secondary | ICD-10-CM | POA: Diagnosis not present

## 2011-08-09 DIAGNOSIS — Z13820 Encounter for screening for osteoporosis: Secondary | ICD-10-CM | POA: Diagnosis not present

## 2011-08-13 ENCOUNTER — Other Ambulatory Visit: Payer: Self-pay | Admitting: Physician Assistant

## 2011-08-13 DIAGNOSIS — C50519 Malignant neoplasm of lower-outer quadrant of unspecified female breast: Secondary | ICD-10-CM

## 2011-08-19 DIAGNOSIS — G5600 Carpal tunnel syndrome, unspecified upper limb: Secondary | ICD-10-CM | POA: Diagnosis not present

## 2011-08-19 DIAGNOSIS — H9319 Tinnitus, unspecified ear: Secondary | ICD-10-CM | POA: Diagnosis not present

## 2011-08-27 DIAGNOSIS — H6120 Impacted cerumen, unspecified ear: Secondary | ICD-10-CM | POA: Diagnosis not present

## 2011-08-27 DIAGNOSIS — H9319 Tinnitus, unspecified ear: Secondary | ICD-10-CM | POA: Diagnosis not present

## 2011-08-27 DIAGNOSIS — H902 Conductive hearing loss, unspecified: Secondary | ICD-10-CM | POA: Diagnosis not present

## 2011-08-31 ENCOUNTER — Telehealth: Payer: Self-pay | Admitting: *Deleted

## 2011-09-20 ENCOUNTER — Other Ambulatory Visit (HOSPITAL_BASED_OUTPATIENT_CLINIC_OR_DEPARTMENT_OTHER): Payer: Medicare Other | Admitting: Lab

## 2011-09-20 DIAGNOSIS — C50519 Malignant neoplasm of lower-outer quadrant of unspecified female breast: Secondary | ICD-10-CM

## 2011-09-20 DIAGNOSIS — Z853 Personal history of malignant neoplasm of breast: Secondary | ICD-10-CM

## 2011-09-20 DIAGNOSIS — M810 Age-related osteoporosis without current pathological fracture: Secondary | ICD-10-CM | POA: Diagnosis not present

## 2011-09-20 LAB — CBC WITH DIFFERENTIAL/PLATELET
Basophils Absolute: 0 10*3/uL (ref 0.0–0.1)
Eosinophils Absolute: 0.2 10*3/uL (ref 0.0–0.5)
HGB: 13.2 g/dL (ref 11.6–15.9)
MONO#: 0.5 10*3/uL (ref 0.1–0.9)
NEUT#: 1.8 10*3/uL (ref 1.5–6.5)
RBC: 4.47 10*6/uL (ref 3.70–5.45)
RDW: 13.1 % (ref 11.2–14.5)
WBC: 4.9 10*3/uL (ref 3.9–10.3)
lymph#: 2.5 10*3/uL (ref 0.9–3.3)
nRBC: 0 % (ref 0–0)

## 2011-09-21 LAB — COMPREHENSIVE METABOLIC PANEL
ALT: 68 U/L — ABNORMAL HIGH (ref 0–35)
Albumin: 4.2 g/dL (ref 3.5–5.2)
CO2: 28 mEq/L (ref 19–32)
Chloride: 103 mEq/L (ref 96–112)
Potassium: 4.2 mEq/L (ref 3.5–5.3)
Sodium: 141 mEq/L (ref 135–145)
Total Bilirubin: 0.6 mg/dL (ref 0.3–1.2)
Total Protein: 7 g/dL (ref 6.0–8.3)

## 2011-09-21 LAB — VITAMIN D 25 HYDROXY (VIT D DEFICIENCY, FRACTURES): Vit D, 25-Hydroxy: 49 ng/mL (ref 30–89)

## 2011-09-22 NOTE — Telephone Encounter (Signed)
No inquiry noted 

## 2011-09-29 ENCOUNTER — Ambulatory Visit (HOSPITAL_BASED_OUTPATIENT_CLINIC_OR_DEPARTMENT_OTHER): Payer: Medicare Other | Admitting: Oncology

## 2011-09-29 ENCOUNTER — Telehealth: Payer: Self-pay | Admitting: Oncology

## 2011-09-29 VITALS — BP 153/78 | HR 69 | Temp 97.7°F | Ht 64.5 in | Wt 188.8 lb

## 2011-09-29 DIAGNOSIS — Z853 Personal history of malignant neoplasm of breast: Secondary | ICD-10-CM

## 2011-09-29 NOTE — Progress Notes (Signed)
ID: Sheila Gardner   DOB: [birth year]  MR#: [id number]  NWG#:[id number]  HISTORY OF PRESENT ILLNESS: Sheila Gardner had her routine screening mammography March 11, 2009 at [facility]. This showed a possible mass in the right breast.  She was recalled for additional studies of the right breast on January 7th.  This confirmed a small rounded mass with ill-defined margins, deep in the right breast, which was not palpable by Dr. Azucena Gardner.  Ultrasound showed it to be 8 mm macrolobulated and near a normal-appearing lymph node in the inferior axillary region.  The patient was brought back for ultrasound-guided biopsy on January 12th and this showed ([id number]) an invasive ductal carcinoma which appeared low grade.  The tumor was estrogen and progesterone receptor positive, both at 100% and it had a low MIB-1 at 10%.  There was no evidence of Her-2 amplification with a ratio by CISH of 1.16.    With this information, the patient was referred to Dr. Ezzard Gardner and bilateral breast MRIs were obtained January 18th. They showed a solitary area of enhancement in the right breast, measuring up to 1.7 cm.  Otherwise, this was a negative study.  On February 16th, the patient underwent a right lumpectomy and sentinel lymph node biopsy and this showed ([id number]) an invasive ductal carcinoma which measured 8 mm with negative and ample margins and no lymphovascular invasion.  The tumor was Grade 1 and 0 of 3 sentinel lymph nodes were involved. Her subsequent history is as detailed below.  INTERVAL HISTORY: She returns today for followup of her breast cancer. She went off her letrozole about a month ago, because she again developed arthralgias and myalgias on this second try at an aromatase inhibitor. Now that she has been off a month she feels clearly better. She has more energy as well as your aches and pains.  REVIEW OF SYSTEMS: Some of her symptoms seem to me to be related to carpal tunnel issues and this was discussed today,  but there is no question that she will not tolerate aromatase inhibitors well. Aside from these issues, she's had some tinnitus and has been seeing Sheila Gardner for that. She is being treated with antibiotics and prednisone. A detailed review of systems was otherwise noncontributory  PAST MEDICAL HISTORY: Past Medical History  Diagnosis Date  . Hyperlipidemia   . GERD (gastroesophageal reflux disease)   . Cancer     Breast- rt  . Arthritis   Significant for history of diverticulosis, history of hypercholesterolemia, history of occasional to frequent headaches which by her description are not migraines, history of remote removal of a thyroid mass, history of removal of a colon polyp August of 2004 under Dr. Roosvelt Gardner.  History of degenerative disk disease particularly involving the neck.  History of eardrum replacement about 39 years ago.  History of lens implants for cataracts under Sheila Gardner.  History of partial ovarian resection bilaterally in 1965.  She had repair of a macular hole in the right eye in December of 2001 under Sheila Gardner.    PAST SURGICAL HISTORY: Past Surgical History  Procedure Date  . Tympanoplasty 30 years ago  . Ovary surgery 40 years ago    wedge  . Eye surgery 2001  . Breast surgery 04/22/2009    Rt lumpectomy    FAMILY HISTORY Family History  Problem Relation Age of Onset  . Cancer Brother     colon, pancreatic, brain  The patient's father died at the age of 53 with  dementia.  The patient's mother died at the age of 69 with heart disease.  The patient had one sister who died at the age of one year from a pulmonary problem, and a sister who died in an automobile accident at age 20.  A third sister survives at age 58.  The patient had 11 brothers.  One had pancreatic cancer and two had "brain cancer".  There is no breast or ovarian cancer in the immediate family, although there are some cousins with breast cancer, none diagnosed before the age of  31.    GYNECOLOGIC HISTORY: She is GX P2.  First pregnancy to term at age 47.  Went through the change of life at age 89.  She never took hormone replacement.    SOCIAL HISTORY: She and her husband Sheila Gardner, who is present today, ran a Catering manager.  He has also done some construction work and her two sons, Sheila Gardner and Sheila Gardner, are both in the building trade.  They both live in [place].  Sheila Gardner has a daughter, Sheila Gardner, who helps with the Tupperware business and a daughter, Sheila Gardner, who works in [employer]. The patient has 13 grandchildren.  She attends the [employer].     ADVANCED DIRECTIVES: Not in place  HEALTH MAINTENANCE: History  Substance Use Topics  . Smoking status: Never Smoker   . Smokeless tobacco: Not on file  . Alcohol Use: No     Colonoscopy:  PAP:  Bone density:  Lipid panel:  No Known Allergies  Current Outpatient Prescriptions  Medication Sig Dispense Refill  . ASPIRIN PO Take by mouth as needed.        . Cyanocobalamin (VITAMIN B-12 PO) Take by mouth.        . FLUOXETINE HCL PO Take 10 mg by mouth daily.        Marland Kitchen letrozole (FEMARA) 2.5 MG tablet TAKE ONE TABLET BY MOUTH EVERY DAY  30 tablet  1  . omeprazole (PRILOSEC) 20 MG capsule daily.      . Pyridoxine HCl (VITAMIN B6 PO) Take by mouth.        . traMADol (ULTRAM) 50 MG tablet Ad lib.      . cholecalciferol (VITAMIN D) 400 UNITS TABS Take 2,000 Units by mouth daily.       . cyclobenzaprine (FLEXERIL) 10 MG tablet Ad lib.      . fenofibrate 160 MG tablet 160 mg daily.       Marland Kitchen FLAXSEED, LINSEED, PO Take by mouth.          OBJECTIVE: Middle-aged white woman who appears well Filed Vitals:   09/29/11 1137  BP: 153/78  Pulse: 69  Temp: 97.7 F (36.5 C)     Body mass index is 31.91 kg/(m^2).    ECOG FS: 0  Sclerae unicteric Oropharynx clear No cervical or supraclavicular adenopathy Lungs no rales or rhonchi Heart regular rate and rhythm Abd benign MSK no focal spinal tenderness, no peripheral  edema Neuro: nonfocal Breasts: The right breast is status post lumpectomy. There is no evidence of local recurrence. The left breast is unremarkable. There is no axillary adenopathy bilaterally.  LAB RESULTS: Lab Results  Component Value Date   WBC 4.9 09/20/2011   NEUTROABS 1.8 09/20/2011   HGB 13.2 09/20/2011   HCT 38.1 09/20/2011   MCV 85.2 09/20/2011   PLT 207 09/20/2011      Chemistry      Component Value Date/Time   NA 141 09/20/2011 0948  K 4.2 09/20/2011 0948   CL 103 09/20/2011 0948   CO2 28 09/20/2011 0948   BUN 7 09/20/2011 0948   CREATININE 0.54 09/20/2011 0948      Component Value Date/Time   CALCIUM 9.6 09/20/2011 0948   ALKPHOS 84 09/20/2011 0948   AST 76* 09/20/2011 0948   ALT 68* 09/20/2011 0948   BILITOT 0.6 09/20/2011 0948       Lab Results  Component Value Date   LABCA2 20 09/20/2011    No components found with this basename: [id number]    No results found for this basename: INR:1;PROTIME:1 in the last 168 hours  Urinalysis No results found for this basename: colorurine, appearanceur, labspec, phurine, glucoseu, hgbur, bilirubinur, ketonesur, proteinur, urobilinogen, nitrite, leukocytesur    STUDIES: Mammography 03/18/2011 was unremarkable. Bone density at [facility] 08/09/2011 was normal  ASSESSMENT: 76 y.o. [place] woman status post right lumpectomy and sentinel lymph node dissection in February 2011 for a T1b N0, stage IA invasive ductal carcinoma, grade 1, strongly estrogen and progesterone receptor positive, HER2/neu negative, with a low MIB-1.  Decided against radiation therapy.  On anastrozole from April 2011 until May 2012 with arthralgias developing.  Started letrozole May 2012, stopped June 2013 again due to arthralgias/myalgias.  PLAN: Nikoleta has made to good faith runs an aromatase inhibitors, and cannot tolerate them. She is very reluctant to try tamoxifen, being acquainted with the possible complications, side effects and toxicities of that medication.  Since she had a T1b tumor, her overall prognosis is excellent. For that reason, even though she never received post lumpectomy radiation, I am not uncomfortable with foregoing any further antiestrogen therapy and proceeding with observation alone.  Since she has mammography in January, we will see her on a yearly basis in February. This has been set up. She knows to call for any other problems that may develop before the next visit.  MAGRINAT,GUSTAV C    09/29/2011

## 2011-09-29 NOTE — Telephone Encounter (Signed)
lmonvm adviisng the pt of her feb 2014 appts °

## 2011-10-08 ENCOUNTER — Ambulatory Visit (INDEPENDENT_AMBULATORY_CARE_PROVIDER_SITE_OTHER): Payer: Medicare Other | Admitting: Surgery

## 2011-10-08 ENCOUNTER — Encounter (INDEPENDENT_AMBULATORY_CARE_PROVIDER_SITE_OTHER): Payer: Self-pay | Admitting: Surgery

## 2011-10-08 VITALS — BP 126/78 | HR 68 | Temp 98.2°F | Resp 14 | Ht 64.0 in | Wt 191.6 lb

## 2011-10-08 DIAGNOSIS — Z853 Personal history of malignant neoplasm of breast: Secondary | ICD-10-CM | POA: Diagnosis not present

## 2011-10-08 NOTE — Progress Notes (Addendum)
Name:  Sheila Gardner MRN:  [id number] Date of birth: August 15, 1935  ASSESSMENT AND PLAN: 1.  Right breast cancer  T1, N0  Lumpectomy 04/22/2009.  No evidence of disease.    For follow up today for the right neck mass.  2.  She has stopped all anti estrogen meds.  She could not tolerate them. 3.  History of cardiomegaly by echo.  Stable cardiac status. 4.  Small mass, posterior right neck.  The patient comes back primarily for the lump on her right back neck.  I did an [place] and took pictures.  She thinks it may have enlarged, but by may exam, I don't think so.  But she is worried about it enough, we will plan to excise it under local anesthesia.  I discussed the surgery with her.  [Excised 10/19/2011 - lipoma.  DN  11/04/2101]  5.  Mildly elevated LFT's.  She has stopped all her meds.  They are still elevated.  HISTORY OF PRESENT ILLNESS: Chief Complaint  Patient presents with  . Routine Post Op    reck lipoma on back of neck    Sheila Gardner is a 76 y.o. (DOB: Oct 02, 1935)  white female who is a patient of ROSS,CHARLES ALAN, MD and comes to me today for follow up breast cancer and right neck mass.  The right neck mass she thinks is larger.  It is not causing pain or problems.  She spends a lot of time with her grand children.    Path (side, TNM): Right, T1, N0. Surgery: Right lumpectomy/SLNBx   Date: 04/22/2009 Size of tumor: 0.8 cm Nodes: 0/3 ER: 100% PR: 100% Ki67: 10%  HER2Neu: Neg  Medical Oncologist: Magrinat  Radiation Oncologist: Michell Heinrich  (she did not undergo rad tx)  PHYSICAL EXAM: BP 126/78  Pulse 68  Temp 98.2 F (36.8 C) (Temporal)  Resp 14  Ht 5\' 4"  (1.626 m)  Wt 191 lb 9.6 oz (86.909 kg)  BMI 32.89 kg/m2  HEENT:  Pupils equal.  Dentition good.  No injury. NECK:  Supple.  No thyroid mass. She has a 2 cm mass at the posterior base of her neck.   This has not changed from the last visit by my exam. LYMPH NODES:  No cervical, supraclavicular, or axillary  adenopathy.    PROCEDURE:  [place] of right posterior neck.  1.9 x 1.8 x 0.8 benign appearing mass.  Possible lipoma.  DATA REVIEWED: None new.

## 2011-10-19 ENCOUNTER — Other Ambulatory Visit (INDEPENDENT_AMBULATORY_CARE_PROVIDER_SITE_OTHER): Payer: Self-pay | Admitting: Surgery

## 2011-10-19 DIAGNOSIS — D1739 Benign lipomatous neoplasm of skin and subcutaneous tissue of other sites: Secondary | ICD-10-CM | POA: Diagnosis not present

## 2011-10-25 DIAGNOSIS — G5600 Carpal tunnel syndrome, unspecified upper limb: Secondary | ICD-10-CM | POA: Diagnosis not present

## 2011-10-29 ENCOUNTER — Telehealth (INDEPENDENT_AMBULATORY_CARE_PROVIDER_SITE_OTHER): Payer: Self-pay | Admitting: General Surgery

## 2011-10-29 NOTE — Telephone Encounter (Signed)
Pt called for path report:  Benign results shared.  Confirmed her post op appt on 11/05/11.

## 2011-11-03 DIAGNOSIS — R7401 Elevation of levels of liver transaminase levels: Secondary | ICD-10-CM | POA: Diagnosis not present

## 2011-11-03 DIAGNOSIS — R7402 Elevation of levels of lactic acid dehydrogenase (LDH): Secondary | ICD-10-CM | POA: Diagnosis not present

## 2011-11-03 DIAGNOSIS — E559 Vitamin D deficiency, unspecified: Secondary | ICD-10-CM | POA: Diagnosis not present

## 2011-11-03 DIAGNOSIS — L659 Nonscarring hair loss, unspecified: Secondary | ICD-10-CM | POA: Diagnosis not present

## 2011-11-05 ENCOUNTER — Encounter (INDEPENDENT_AMBULATORY_CARE_PROVIDER_SITE_OTHER): Payer: Self-pay | Admitting: Surgery

## 2011-11-05 ENCOUNTER — Ambulatory Visit (INDEPENDENT_AMBULATORY_CARE_PROVIDER_SITE_OTHER): Payer: Medicare Other | Admitting: Surgery

## 2011-11-05 VITALS — BP 144/84 | HR 62 | Temp 96.3°F | Resp 18 | Ht 64.0 in | Wt 189.6 lb

## 2011-11-05 DIAGNOSIS — Z853 Personal history of malignant neoplasm of breast: Secondary | ICD-10-CM

## 2011-11-05 NOTE — Progress Notes (Signed)
Post op visit  Mass base of right neck posteriorly - lipoma  Excised - 10/19/2011  See 10/08/2011 note for full history  Wound okay  She will see me back in 6 months.  Ovidio Kin, MD, [facility] Surgery Pager: [phone] Office phone:  [phone]

## 2011-11-09 DIAGNOSIS — H00029 Hordeolum internum unspecified eye, unspecified eyelid: Secondary | ICD-10-CM | POA: Diagnosis not present

## 2011-11-09 DIAGNOSIS — H01009 Unspecified blepharitis unspecified eye, unspecified eyelid: Secondary | ICD-10-CM | POA: Diagnosis not present

## 2011-11-19 DIAGNOSIS — R7401 Elevation of levels of liver transaminase levels: Secondary | ICD-10-CM | POA: Diagnosis not present

## 2011-11-19 DIAGNOSIS — R7402 Elevation of levels of lactic acid dehydrogenase (LDH): Secondary | ICD-10-CM | POA: Diagnosis not present

## 2011-12-23 DIAGNOSIS — Z23 Encounter for immunization: Secondary | ICD-10-CM | POA: Diagnosis not present

## 2011-12-26 ENCOUNTER — Encounter (HOSPITAL_COMMUNITY): Payer: Self-pay | Admitting: Emergency Medicine

## 2011-12-26 ENCOUNTER — Emergency Department (HOSPITAL_COMMUNITY)
Admission: EM | Admit: 2011-12-26 | Discharge: 2011-12-26 | Disposition: A | Discharge status: Home / Self Care | Payer: Medicare Other | Attending: Emergency Medicine | Admitting: Emergency Medicine

## 2011-12-26 ENCOUNTER — Emergency Department (HOSPITAL_COMMUNITY): Payer: Medicare Other

## 2011-12-26 DIAGNOSIS — Y9229 Other specified public building as the place of occurrence of the external cause: Secondary | ICD-10-CM | POA: Insufficient documentation

## 2011-12-26 DIAGNOSIS — W010XXA Fall on same level from slipping, tripping and stumbling without subsequent striking against object, initial encounter: Secondary | ICD-10-CM | POA: Insufficient documentation

## 2011-12-26 DIAGNOSIS — S99929A Unspecified injury of unspecified foot, initial encounter: Secondary | ICD-10-CM | POA: Diagnosis not present

## 2011-12-26 DIAGNOSIS — R52 Pain, unspecified: Secondary | ICD-10-CM | POA: Diagnosis not present

## 2011-12-26 DIAGNOSIS — S8000XA Contusion of unspecified knee, initial encounter: Secondary | ICD-10-CM | POA: Diagnosis not present

## 2011-12-26 DIAGNOSIS — M25579 Pain in unspecified ankle and joints of unspecified foot: Secondary | ICD-10-CM | POA: Diagnosis not present

## 2011-12-26 DIAGNOSIS — S8990XA Unspecified injury of unspecified lower leg, initial encounter: Secondary | ICD-10-CM | POA: Diagnosis not present

## 2011-12-26 DIAGNOSIS — S82843A Displaced bimalleolar fracture of unspecified lower leg, initial encounter for closed fracture: Secondary | ICD-10-CM

## 2011-12-26 MED ORDER — OXYCODONE-ACETAMINOPHEN 5-325 MG PO TABS: 2.0000 | ORAL_TABLET | ORAL | Status: DC | PRN

## 2011-12-26 MED ORDER — MELOXICAM 15 MG PO TABS: 15.0000 mg | ORAL_TABLET | Freq: Every day | ORAL | Status: DC

## 2011-12-26 MED ORDER — HYDROMORPHONE HCL PF 1 MG/ML IJ SOLN
1.0000 mg | Freq: Once | INTRAMUSCULAR | Status: AC
  Administered 2011-12-26: 1 mg via INTRAVENOUS
  Filled 2011-12-26: qty 1

## 2011-12-26 MED ORDER — FENTANYL CITRATE 0.05 MG/ML IJ SOLN
50.0000 ug | Freq: Once | INTRAMUSCULAR | Status: AC
  Administered 2011-12-26: 50 ug via INTRAVENOUS
  Filled 2011-12-26: qty 2

## 2011-12-26 MED ORDER — IBUPROFEN 800 MG PO TABS
800.0000 mg | ORAL_TABLET | Freq: Once | ORAL | Status: AC
  Administered 2011-12-26: 800 mg via ORAL
  Filled 2011-12-26: qty 1

## 2011-12-26 MED ORDER — ONDANSETRON HCL 4 MG/2ML IJ SOLN
4.0000 mg | Freq: Once | INTRAMUSCULAR | Status: AC
  Administered 2011-12-26: 4 mg via INTRAVENOUS
  Filled 2011-12-26: qty 2

## 2011-12-26 NOTE — ED Provider Notes (Signed)
History     CSN: [id number]  Arrival date & time 12/26/11  1255   First MD Initiated Contact with Patient 12/26/11 1312      Chief Complaint  Patient presents with  . Fall  . Ankle Pain    (Consider location/radiation/quality/duration/timing/severity/associated sxs/prior treatment) HPI Comments: MAUI AHART 76 y.o. female   The chief complaint is: Patient presents with:   Fall   Ankle Pain    76 year old female presents today with chief complaint of right ankle pain. She arrives by EMS. Patient was leaving church today. She was wearing clog shoes with an elevation of approximately 3 inches. The patient hit a patch of on each skin surface and E., vertigo, the ankle, falling to the ground onto her right knee. She rates the pain at that time. At a 10 out of 10. She had immediate swelling, and ankle deformity. Per EMS. Patient was given fentanyl for pain. She has no history of sprains or fractures up to this point. She denies any numbness or tingling. She denies hitting her head any loss of consciousness. She denies pain in any other joint except her knee which is bruised.   Patient is a 76 y.o. female presenting with fall and ankle pain. The history is provided by the patient. No language interpreter was used.  Fall Pertinent negatives include no fever, no numbness, no abdominal pain, no nausea, no vomiting and no hematuria.  Ankle Pain  Pertinent negatives include no numbness.    Past Medical History  Diagnosis Date  . Hyperlipidemia   . GERD (gastroesophageal reflux disease)   . Cancer     Breast- rt  . Arthritis   . Lipoma     back of neck    Past Surgical History  Procedure Date  . Tympanoplasty 30 years ago  . Ovary surgery 40 years ago    wedge  . Eye surgery 2001  . Breast surgery 04/22/2009    Rt lumpectomy    Family History  Problem Relation Age of Onset  . Cancer Brother     colon, pancreatic, brain    History  Substance Use Topics  . Smoking  status: Never Smoker   . Smokeless tobacco: Never Used  . Alcohol Use: No    OB History    Grav Para Term Preterm Abortions TAB SAB Ect Mult Living                  Review of Systems  Constitutional: Negative for fever and chills.  HENT: Negative for trouble swallowing.   Respiratory: Negative for shortness of breath.   Cardiovascular: Negative for chest pain.  Gastrointestinal: Negative for nausea, vomiting, abdominal pain, diarrhea and constipation.  Genitourinary: Negative for dysuria and hematuria.  Musculoskeletal: Positive for joint swelling and gait problem. Negative for myalgias and arthralgias.  Skin: Negative for rash.  Neurological: Negative for seizures, syncope and numbness.  All other systems reviewed and are negative.    Allergies  Review of patient's allergies indicates no known allergies.  Home Medications   Current Outpatient Rx  Name Route Sig Dispense Refill  . ASPIRIN EC 81 MG PO TBEC Oral Take 81 mg by mouth daily.    Marland Kitchen VITAMIN B-12 PO Oral Take 1 tablet by mouth daily.     . OMEGA-3 FATTY ACIDS 1000 MG PO CAPS Oral Take 1 g by mouth daily.    Marland Kitchen FLUOXETINE HCL 10 MG PO CAPS Oral Take 10 mg by mouth daily.    Marland Kitchen  OMEPRAZOLE 20 MG PO CPDR Oral Take 20 mg by mouth daily.     Marland Kitchen VITAMIN B6 PO Oral Take 1 tablet by mouth daily.     . TRAMADOL HCL 50 MG PO TABS Oral Take 50 mg by mouth every 6 (six) hours as needed. pain      BP 153/73  Pulse 61  Temp 98.2 F (36.8 C) (Oral)  Resp 18  Ht 5\' 4"  (1.626 m)  Wt 186 lb (84.369 kg)  BMI 31.93 kg/m2  SpO2 96%  Physical Exam  Nursing note and vitals reviewed. Constitutional: She is oriented to person, place, and time. She appears well-developed and well-nourished. No distress.  HENT:  Head: Normocephalic and atraumatic.  Eyes: Conjunctivae normal are normal. No scleral icterus.  Neck: Normal range of motion.  Cardiovascular: Normal rate, regular rhythm and normal heart sounds.  Exam reveals no gallop and  no friction rub.   No murmur heard. Pulmonary/Chest: Effort normal and breath sounds normal. No respiratory distress.  Abdominal: Soft. Bowel sounds are normal. She exhibits no distension and no mass. There is no tenderness. There is no guarding.  Musculoskeletal:       Right ankle: She exhibits decreased range of motion, swelling and deformity. She exhibits no ecchymosis, no laceration and normal pulse. tenderness.       Legs:      Feet:       Pockets swelling, and tenderness bilaterally over the malleoli. Patient is neurovascularly intact. There is mild deformity. The foot is sitting in an inverted position. Ecchymosis. She is tender to palpation. There is a small abrasion over the medial malleolus. No lacerations. A small abrasion and developing hematoma over the right knee  Neurological: She is alert and oriented to person, place, and time.  Skin: Skin is warm and dry. She is not diaphoretic.    ED Course  Procedures (including critical care time)  Labs Reviewed - No data to display Dg Ankle Complete Right  12/26/2011  *RADIOLOGY REPORT*  Clinical Data: Fall, ankle pain  RIGHT ANKLE - COMPLETE 3+ VIEW  Comparison: None  Findings: Three views of the right ankle submitted.  There is displaced fracture of the distal right tibia and fibula medial and lateral malleolus with disruption of the ankle mortise. Significant soft tissue swelling.  IMPRESSION: Displaced fracture of medial and lateral malleolus with disruption of the ankle mortise.  Significant soft tissue swelling.   Original Report Authenticated By: Natasha Mead, M.D.      1. Ankle fracture, bimalleolar, closed       MDM  Patient with bilateral malleoli fracture with mortise displacement laterally. I spoken with Dr. Charlann Boxer, who suggests that we splint the patient. Give her crutches. She will be nonweightbearing and followup with Dr. Victorino Dike at [facility] orthopedics tomorrow. I am giving the patient. Mobic, and Percocet for pain  relief. Supportive care.  All questions answered fully.  Discussed reasons to seek immediate care. Patient expresses understanding and agrees with plan.         Arthor Captain, PA-C 12/26/11 1534

## 2011-12-26 NOTE — ED Notes (Signed)
Per EMS: Pt was walking at church at fell due to uneven concrete. Pt has obvious deformity to right ankle. Denies LOC. Pt reported 10/10 pain and received 100 Fentanyl by EMS.

## 2011-12-26 NOTE — ED Notes (Signed)
BJY:[id number]<FA> Expected date:12/26/11<BR> Expected time:12:50 PM<BR> Means of arrival:Ambulance<BR> Comments:<BR> Fall ankle inj

## 2011-12-26 NOTE — ED Provider Notes (Signed)
Medical screening examination/treatment/procedure(s) were conducted as a shared visit with non-physician practitioner(s) and myself.  I personally evaluated the patient during the encounter  Isolated right ankle pain after fall, right ankle tender, right foot normal LT with CR<2secs, right prox fib NT  Hurman Horn, MD 12/31/11 2105

## 2011-12-29 DIAGNOSIS — S82843A Displaced bimalleolar fracture of unspecified lower leg, initial encounter for closed fracture: Secondary | ICD-10-CM | POA: Diagnosis not present

## 2012-01-04 DIAGNOSIS — S82843A Displaced bimalleolar fracture of unspecified lower leg, initial encounter for closed fracture: Secondary | ICD-10-CM | POA: Diagnosis not present

## 2012-01-04 DIAGNOSIS — W19XXXA Unspecified fall, initial encounter: Secondary | ICD-10-CM | POA: Diagnosis not present

## 2012-01-04 DIAGNOSIS — Y999 Unspecified external cause status: Secondary | ICD-10-CM | POA: Diagnosis not present

## 2012-01-04 DIAGNOSIS — Y939 Activity, unspecified: Secondary | ICD-10-CM | POA: Diagnosis not present

## 2012-01-04 DIAGNOSIS — Y929 Unspecified place or not applicable: Secondary | ICD-10-CM | POA: Diagnosis not present

## 2012-01-17 DIAGNOSIS — Z4889 Encounter for other specified surgical aftercare: Secondary | ICD-10-CM | POA: Diagnosis not present

## 2012-01-26 DIAGNOSIS — Z4889 Encounter for other specified surgical aftercare: Secondary | ICD-10-CM | POA: Diagnosis not present

## 2012-02-11 DIAGNOSIS — Z4889 Encounter for other specified surgical aftercare: Secondary | ICD-10-CM | POA: Diagnosis not present

## 2012-02-14 ENCOUNTER — Other Ambulatory Visit: Payer: Self-pay | Admitting: Obstetrics and Gynecology

## 2012-02-14 DIAGNOSIS — Z9889 Other specified postprocedural states: Secondary | ICD-10-CM

## 2012-02-14 DIAGNOSIS — Z853 Personal history of malignant neoplasm of breast: Secondary | ICD-10-CM

## 2012-02-25 ENCOUNTER — Ambulatory Visit (INDEPENDENT_AMBULATORY_CARE_PROVIDER_SITE_OTHER): Payer: Medicare Other | Admitting: Surgery

## 2012-03-14 ENCOUNTER — Encounter (INDEPENDENT_AMBULATORY_CARE_PROVIDER_SITE_OTHER): Payer: Self-pay | Admitting: Surgery

## 2012-03-29 ENCOUNTER — Ambulatory Visit
Admission: RE | Admit: 2012-03-29 | Discharge: 2012-03-29 | Disposition: A | Discharge status: Home / Self Care | Payer: Medicare Other | Source: Ambulatory Visit | Attending: Obstetrics and Gynecology | Admitting: Obstetrics and Gynecology

## 2012-03-29 DIAGNOSIS — Z853 Personal history of malignant neoplasm of breast: Secondary | ICD-10-CM

## 2012-03-29 DIAGNOSIS — Z9889 Other specified postprocedural states: Secondary | ICD-10-CM

## 2012-05-03 ENCOUNTER — Other Ambulatory Visit: Payer: Self-pay | Admitting: Physician Assistant

## 2012-05-03 DIAGNOSIS — Z853 Personal history of malignant neoplasm of breast: Secondary | ICD-10-CM

## 2012-05-03 DIAGNOSIS — E559 Vitamin D deficiency, unspecified: Secondary | ICD-10-CM

## 2012-05-04 ENCOUNTER — Ambulatory Visit (HOSPITAL_BASED_OUTPATIENT_CLINIC_OR_DEPARTMENT_OTHER): Payer: Medicare Other | Admitting: Physician Assistant

## 2012-05-04 ENCOUNTER — Other Ambulatory Visit (HOSPITAL_BASED_OUTPATIENT_CLINIC_OR_DEPARTMENT_OTHER): Payer: Medicare Other | Admitting: Lab

## 2012-05-04 ENCOUNTER — Telehealth: Payer: Self-pay | Admitting: Oncology

## 2012-05-04 ENCOUNTER — Encounter: Payer: Self-pay | Admitting: Physician Assistant

## 2012-05-04 VITALS — BP 132/88 | HR 67 | Temp 98.0°F | Resp 20 | Ht 64.0 in | Wt 188.8 lb

## 2012-05-04 DIAGNOSIS — C50919 Malignant neoplasm of unspecified site of unspecified female breast: Secondary | ICD-10-CM

## 2012-05-04 DIAGNOSIS — Z853 Personal history of malignant neoplasm of breast: Secondary | ICD-10-CM

## 2012-05-04 DIAGNOSIS — E559 Vitamin D deficiency, unspecified: Secondary | ICD-10-CM

## 2012-05-04 DIAGNOSIS — R7989 Other specified abnormal findings of blood chemistry: Secondary | ICD-10-CM

## 2012-05-04 DIAGNOSIS — Z170 Estrogen receptor positive status [ER+]: Secondary | ICD-10-CM

## 2012-05-04 LAB — COMPREHENSIVE METABOLIC PANEL (CC13)
ALT: 59 U/L — ABNORMAL HIGH (ref 0–55)
Albumin: 3.7 g/dL (ref 3.5–5.0)
CO2: 27 mEq/L (ref 22–29)
Calcium: 10 mg/dL (ref 8.4–10.4)
Chloride: 102 mEq/L (ref 98–107)
Glucose: 99 mg/dl (ref 70–99)
Sodium: 141 mEq/L (ref 136–145)
Total Bilirubin: 0.81 mg/dL (ref 0.20–1.20)
Total Protein: 7.7 g/dL (ref 6.4–8.3)

## 2012-05-04 LAB — CBC WITH DIFFERENTIAL/PLATELET
Basophils Absolute: 0 10*3/uL (ref 0.0–0.1)
Eosinophils Absolute: 0.2 10*3/uL (ref 0.0–0.5)
HCT: 38.5 % (ref 34.8–46.6)
MCH: 29.7 pg (ref 25.1–34.0)
MCHC: 34 g/dL (ref 31.5–36.0)
MONO#: 0.8 10*3/uL (ref 0.1–0.9)
MONO%: 9.3 % (ref 0.0–14.0)
RBC: 4.41 10*6/uL (ref 3.70–5.45)
RDW: 13.4 % (ref 11.2–14.5)

## 2012-05-04 NOTE — Telephone Encounter (Signed)
gv pt appt schedule for August 2015, February 2015 and mammo for 04/02/13.

## 2012-05-04 NOTE — Progress Notes (Signed)
ID: Sheila Gardner   DOB: 1935/10/01  MR#: [id number]  JYN#:[id number]  HISTORY OF PRESENT ILLNESS: Sheila Gardner had her routine screening mammography March 11, 2009 at [facility]. This showed a possible mass in the right breast.  She was recalled for additional studies of the right breast on January 7th.  This confirmed a small rounded mass with ill-defined margins, deep in the right breast, which was not palpable by Dr. Azucena Kuba.  Ultrasound showed it to be 8 mm macrolobulated and near a normal-appearing lymph node in the inferior axillary region.  The patient was brought back for ultrasound-guided biopsy on January 12th and this showed ([id number]) an invasive ductal carcinoma which appeared low grade.  The tumor was estrogen and progesterone receptor positive, both at 100% and it had a low MIB-1 at 10%.  There was no evidence of Her-2 amplification with a ratio by CISH of 1.16.    With this information, the patient was referred to Dr. Ezzard Standing and bilateral breast MRIs were obtained January 18th. They showed a solitary area of enhancement in the right breast, measuring up to 1.7 cm.  Otherwise, this was a negative study.  On February 16th, the patient underwent a right lumpectomy and sentinel lymph node biopsy and this showed ([id number]) an invasive ductal carcinoma which measured 8 mm with negative and ample margins and no lymphovascular invasion.  The tumor was Grade 1 and 0 of 3 sentinel lymph nodes were involved. Her subsequent history is as detailed below.  INTERVAL HISTORY: She returns today for followup of her right breast cancer. She has been off anti-estrogen therapy now since approximately June of 2013 and "feels great". Her joint pain improved significantly, as did her energy level.  Interval history is remarkable for Sheila Gardner having fallen in October 2013, fracturing her ankle. She is still undergoing physical therapy, but is beginning to recover.    She has been diagnosed with "fatty liver" and  we have been following her elevated LFTs which actually are stable to improved.  REVIEW OF SYSTEMS: Sheila Gardner denies any recent illnesses and has had no fevers or chills. She denies any skin changes. She's had no abnormal bleeding. She's eating and drinking well, with no nausea or change in bowel or bladder habits. She denies any cough, phlegm production, pleurisy, shortness of breath, chest pain, or palpitations. She's had no abnormal headaches or dizziness. She currently denies any unusual myalgias, arthralgias, bony pain, or peripheral swelling.  A detailed review of systems was otherwise noncontributory.  PAST MEDICAL HISTORY: Past Medical History  Diagnosis Date  . Hyperlipidemia   . GERD (gastroesophageal reflux disease)   . Cancer     Breast- rt  . Arthritis   . Lipoma     back of neck  Significant for history of diverticulosis, history of hypercholesterolemia, history of occasional to frequent headaches which by her description are not migraines, history of remote removal of a thyroid mass, history of removal of a colon polyp August of 2004 under Dr. Roosvelt Harps.  History of degenerative disk disease particularly involving the neck.  History of eardrum replacement about 39 years ago.  History of lens implants for cataracts under Blima Ledger.  History of partial ovarian resection bilaterally in 1965.  She had repair of a macular hole in the right eye in December of 2001 under Jonita Albee Harriott.    PAST SURGICAL HISTORY: Past Surgical History  Procedure Laterality Date  . Tympanoplasty  30 years ago  . Ovary surgery  40 years ago    wedge  . Eye surgery  2001  . Breast surgery  04/22/2009    Rt lumpectomy    FAMILY HISTORY Family History  Problem Relation Age of Onset  . Cancer Brother     colon, pancreatic, brain  The patient's father died at the age of 74 with dementia.  The patient's mother died at the age of 39 with heart disease.  The patient had one sister who died at  the age of one year from a pulmonary problem, and a sister who died in an automobile accident at age 17.  A third sister survives at age 34.  The patient had 11 brothers.  One had pancreatic cancer and two had "brain cancer".  There is no breast or ovarian cancer in the immediate family, although there are some cousins with breast cancer, none diagnosed before the age of 72.    GYNECOLOGIC HISTORY: She is GX P2.  First pregnancy to term at age 70.  Went through the change of life at age 30.  She never took hormone replacement.    SOCIAL HISTORY: She and her husband Sheila Gardner, who is present today, ran a Catering manager.  He has also done some construction work and her two sons, Sheila Gardner and Sheila Gardner, are both in the building trade.  They both live in [place].  Sheila Gardner has a daughter, Sheila Gardner, who helps with the [employer] business and a daughter, Sheila Gardner, who works in [employer]. The patient has 13 grandchildren.  She attends the [employer].     ADVANCED DIRECTIVES: Not in place  HEALTH MAINTENANCE: History  Substance Use Topics  . Smoking status: Never Smoker   . Smokeless tobacco: Never Used  . Alcohol Use: No     Colonoscopy: UTD  PAP:  Bone density: 08/09/2011, normal  Lipid panel:   No Known Allergies  Current Outpatient Prescriptions  Medication Sig Dispense Refill  . aspirin EC 81 MG tablet Take 81 mg by mouth daily.      . Cholecalciferol (VITAMIN D-3) 1000 UNITS CAPS Take 1 capsule by mouth daily.      . Cyanocobalamin (VITAMIN B-12 PO) Take 1 tablet by mouth daily.       . fish oil-omega-3 fatty acids 1000 MG capsule Take 1 g by mouth daily.      Marland Kitchen FLUoxetine (PROZAC) 10 MG capsule Take 10 mg by mouth daily.      . meloxicam (MOBIC) 15 MG tablet Take 1 tablet (15 mg total) by mouth daily.  10 tablet  0  . omeprazole (PRILOSEC) 20 MG capsule Take 20 mg by mouth daily.       . Pyridoxine HCl (VITAMIN B6 PO) Take 1 tablet by mouth daily.       . traMADol (ULTRAM) 50 MG tablet  Take 50 mg by mouth every 6 (six) hours as needed. pain      . vitamin C (ASCORBIC ACID) 500 MG tablet Take 500 mg by mouth daily.       No current facility-administered medications for this visit.    OBJECTIVE: Middle-aged white woman who appears well Filed Vitals:   05/04/12 1343  BP: 132/88  Pulse: 67  Temp: 98 F (36.7 C)  Resp: 20     Body mass index is 32.39 kg/(m^2).    ECOG FS: 0 Filed Weights   05/04/12 1343  Weight: 188 lb 12.8 oz (85.639 kg)   Sclerae unicteric Oropharynx clear No cervical or supraclavicular adenopathy  Lungs clear to auscultation with no rales or rhonchi Heart regular rate and rhythm Abdomen soft, nontender to palpation, positive bowel sounds MSK no focal spinal tenderness, no peripheral edema Neuro: nonfocal, well oriented with positive affect Breasts: The right breast is status post lumpectomy. There is no evidence of local recurrence. The left breast is unremarkable. There is no axillary adenopathy bilaterally.  LAB RESULTS: Lab Results  Component Value Date   WBC 8.3 05/04/2012   NEUTROABS 3.4 05/04/2012   HGB 13.1 05/04/2012   HCT 38.5 05/04/2012   MCV 87.3 05/04/2012   PLT 262 05/04/2012      Chemistry      Component Value Date/Time   NA 141 05/04/2012 1313   NA 141 09/20/2011 0948   K 3.6 05/04/2012 1313   K 4.2 09/20/2011 0948   CL 102 05/04/2012 1313   CL 103 09/20/2011 0948   CO2 27 05/04/2012 1313   CO2 28 09/20/2011 0948   BUN 12.4 05/04/2012 1313   BUN 7 09/20/2011 0948   CREATININE 0.7 05/04/2012 1313   CREATININE 0.54 09/20/2011 0948      Component Value Date/Time   CALCIUM 10.0 05/04/2012 1313   CALCIUM 9.6 09/20/2011 0948   ALKPHOS 91 05/04/2012 1313   ALKPHOS 84 09/20/2011 0948   AST 65* 05/04/2012 1313   AST 76* 09/20/2011 0948   ALT 59* 05/04/2012 1313   ALT 68* 09/20/2011 0948   BILITOT 0.81 05/04/2012 1313   BILITOT 0.6 09/20/2011 0948       Lab Results  Component Value Date   LABCA2 20 09/20/2011      STUDIES: [facility].   B[facility] density at [facility] 08/09/2011 was normal.  An MRI of the liver on 03/29/2011 showed hepatic steatosis with fatty sparing in the caudate lobe. There is no evidence of hepatic metastasis at that time.   ASSESSMENT: 77 y.o. [place] woman   (1)  status post right lumpectomy and sentinel lymph node dissection in February 2011 for a T1b N0, stage IA invasive ductal carcinoma, grade 1, strongly estrogen and progesterone receptor positive, HER2/neu negative, with a low MIB-1.    (2)  Decided against radiation therapy.    (3)  On anastrozole from April 2011 until May 2012 with arthralgias developing.    (4)  Started letrozole May 2012, stopped June 2013 again due to arthralgias/myalgias.  (5)  now followed with observation alone  PLAN:  Halle appears to be doing very well with regards to her breast cancer, and we will initiate annual followups at this time. She does worry about the elevated LFTs (thought to be secondary to the hepatic steatosis) and we will check only her CMET again in August. She'll then return one year from now, February of 2015, and will have her annual mammogram prior to that appointment.  She knows to call for any other problems that may develop before the next visit.  Donnivan Villena    05/04/2012

## 2012-05-05 LAB — VITAMIN D 25 HYDROXY (VIT D DEFICIENCY, FRACTURES): Vit D, 25-Hydroxy: 46 ng/mL (ref 30–89)

## 2012-05-05 LAB — CANCER ANTIGEN 27.29: CA 27.29: 16 U/mL (ref 0–39)

## 2012-06-16 ENCOUNTER — Encounter (INDEPENDENT_AMBULATORY_CARE_PROVIDER_SITE_OTHER): Payer: Self-pay | Admitting: Surgery

## 2012-06-16 ENCOUNTER — Ambulatory Visit (INDEPENDENT_AMBULATORY_CARE_PROVIDER_SITE_OTHER): Payer: Medicare Other | Admitting: Surgery

## 2012-06-16 VITALS — BP 122/71 | HR 68 | Temp 97.0°F | Resp 12 | Ht 64.5 in | Wt 188.4 lb

## 2012-06-16 DIAGNOSIS — Z853 Personal history of malignant neoplasm of breast: Secondary | ICD-10-CM

## 2012-06-16 NOTE — Progress Notes (Signed)
Name:  Sheila Gardner MRN:  [id number] Date of birth: 1935-09-09  ASSESSMENT AND PLAN: 1.  Right breast cancer,  T1, N0  0.8 cm, Grade I, ER - 100%, PR - 100%, Ki-67 - 10%  Lumpectomy 04/22/2009.  No evidence of disease.    Tried anti-estrogen, but did not tolerate them well - so is on no anti-estrogen.  She is beyond 2 years from her surgery.  I will alternate her follow up.  She will see Dr. Darnelle Catalan in December and I will see her about next June. So I will be her summer doctor.  2.  She has stopped all anti estrogen meds.  She could not tolerate them.  She took them for maybe 2 1/2 years. 3.  History of cardiomegaly by echo.  Stable cardiac status. 4.  Small mass, posterior right neck.  Excised 10/19/2011 - lipoma.  This incision looks good. 5.  Mildly elevated LFT's.   HISTORY OF PRESENT ILLNESS: Chief Complaint  Patient presents with  . Breast Cancer Long Term Follow Up   Sheila Gardner is a 77 y.o. (DOB: 1935/10/18)  white female who is a patient of ROSS,CHARLES ALAN, MD and comes to me today for follow up right breast breast cancer. She is doing well and in good spirits today.  No new complaint or concern.  Path (side, TNM): Right, T1, N0. Surgery: Right lumpectomy/SLNBx   Date: 04/22/2009 Size of tumor: 0.8 cm Nodes: 0/3 ER: 100% PR: 100% Ki67: 10%  HER2Neu: Neg  Medical Oncologist: Magrinat  Radiation Oncologist: Michell Heinrich  (she did not undergo rad tx)  Social History: She spends a lot of time with her grand children.    PHYSICAL EXAM: BP 122/71  Pulse 68  Temp(Src) 97 F (36.1 C) (Temporal)  Resp 12  Ht 5' 4.5" (1.638 m)  Wt 188 lb 6.4 oz (85.458 kg)  BMI 31.85 kg/m2  HEENT:  Pupils equal.  Dentition good.  NECK:  Supple.  No thyroid mass.   The scar on her posterior right neck is okay. LYMPH NODES:  No cervical, supraclavicular, or axillary adenopathy.    BREASTS -  RIGHT:  No palpable mass or nodule.  No nipple discharge.  Scar at 7 o'clock is okay.   LEFT:   No palpable mass or nodule.  No nipple discharge. UPPER EXTREMITIES:  No evidence of lymphedema.  DATA REVIEWED: Mammograms - 03/29/2012 - negative  Ovidio Kin, MD, [facility] Pager: [phone] Office phone:  [phone]

## 2012-10-11 ENCOUNTER — Other Ambulatory Visit: Payer: Self-pay

## 2012-10-19 ENCOUNTER — Other Ambulatory Visit (HOSPITAL_BASED_OUTPATIENT_CLINIC_OR_DEPARTMENT_OTHER): Payer: Medicare Other | Admitting: Lab

## 2012-10-19 DIAGNOSIS — Z853 Personal history of malignant neoplasm of breast: Secondary | ICD-10-CM

## 2012-10-19 LAB — COMPREHENSIVE METABOLIC PANEL (CC13)
ALT: 49 U/L (ref 0–55)
AST: 64 U/L — ABNORMAL HIGH (ref 5–34)
Alkaline Phosphatase: 67 U/L (ref 40–150)
Creatinine: 0.7 mg/dL (ref 0.6–1.1)
Sodium: 141 mEq/L (ref 136–145)
Total Bilirubin: 1.05 mg/dL (ref 0.20–1.20)
Total Protein: 7.6 g/dL (ref 6.4–8.3)

## 2012-10-20 ENCOUNTER — Other Ambulatory Visit: Payer: Self-pay | Admitting: *Deleted

## 2012-10-23 ENCOUNTER — Other Ambulatory Visit: Payer: Self-pay | Admitting: Emergency Medicine

## 2013-01-11 ENCOUNTER — Other Ambulatory Visit: Payer: Self-pay

## 2013-04-02 ENCOUNTER — Ambulatory Visit
Admission: RE | Admit: 2013-04-02 | Discharge: 2013-04-02 | Disposition: A | Discharge status: Home / Self Care | Payer: Medicare Other | Source: Ambulatory Visit | Attending: Physician Assistant | Admitting: Physician Assistant

## 2013-04-02 ENCOUNTER — Other Ambulatory Visit: Payer: Self-pay | Admitting: Physician Assistant

## 2013-04-02 DIAGNOSIS — Z853 Personal history of malignant neoplasm of breast: Secondary | ICD-10-CM

## 2013-04-26 ENCOUNTER — Other Ambulatory Visit: Payer: Medicare Other

## 2013-04-26 ENCOUNTER — Telehealth: Payer: Self-pay | Admitting: Oncology

## 2013-04-26 NOTE — Telephone Encounter (Signed)
called cell returning pt's call re r/s 2/19 lb due to out of town. s/w pt re new lb appt for 2/24 @ 1:30pm. other appts remain the same.

## 2013-05-01 ENCOUNTER — Other Ambulatory Visit: Payer: Medicare Other

## 2013-05-01 ENCOUNTER — Telehealth: Payer: Self-pay | Admitting: *Deleted

## 2013-05-01 NOTE — Telephone Encounter (Signed)
Patient due for labs and md this week but has been sick with lots of vomiting, diarrhea and wants to give it a few more days to get better. She would like to r/s to next week. Informed patient we could move appts and call her back to confirm. Patient verbalized understanding also that Dr Jana Hakim is booked and will be seen by Dr Owens Loffler.

## 2013-05-03 ENCOUNTER — Ambulatory Visit: Payer: Medicare Other | Admitting: Oncology

## 2013-05-03 ENCOUNTER — Telehealth: Payer: Self-pay | Admitting: Oncology

## 2013-05-03 NOTE — Telephone Encounter (Signed)
, °

## 2013-05-07 ENCOUNTER — Other Ambulatory Visit (HOSPITAL_BASED_OUTPATIENT_CLINIC_OR_DEPARTMENT_OTHER): Payer: Medicare Other

## 2013-05-07 DIAGNOSIS — Z853 Personal history of malignant neoplasm of breast: Secondary | ICD-10-CM

## 2013-05-07 DIAGNOSIS — E559 Vitamin D deficiency, unspecified: Secondary | ICD-10-CM

## 2013-05-07 DIAGNOSIS — C50919 Malignant neoplasm of unspecified site of unspecified female breast: Secondary | ICD-10-CM

## 2013-05-07 LAB — COMPREHENSIVE METABOLIC PANEL (CC13)
ALT: 33 U/L (ref 0–55)
AST: 38 U/L — ABNORMAL HIGH (ref 5–34)
Albumin: 3.7 g/dL (ref 3.5–5.0)
Alkaline Phosphatase: 73 U/L (ref 40–150)
Anion Gap: 9 mEq/L (ref 3–11)
BUN: 11.6 mg/dL (ref 7.0–26.0)
CO2: 28 mEq/L (ref 22–29)
Calcium: 10 mg/dL (ref 8.4–10.4)
Chloride: 102 mEq/L (ref 98–109)
Creatinine: 0.7 mg/dL (ref 0.6–1.1)
Glucose: 125 mg/dl (ref 70–140)
Potassium: 4.3 mEq/L (ref 3.5–5.1)
Sodium: 139 mEq/L (ref 136–145)
Total Bilirubin: 0.55 mg/dL (ref 0.20–1.20)
Total Protein: 8 g/dL (ref 6.4–8.3)

## 2013-05-07 LAB — CBC WITH DIFFERENTIAL/PLATELET
BASO%: 0.9 % (ref 0.0–2.0)
BASOS ABS: 0.1 10*3/uL (ref 0.0–0.1)
EOS ABS: 0.1 10*3/uL (ref 0.0–0.5)
EOS%: 1.3 % (ref 0.0–7.0)
HCT: 40.9 % (ref 34.8–46.6)
HEMOGLOBIN: 13.8 g/dL (ref 11.6–15.9)
LYMPH#: 3.7 10*3/uL — AB (ref 0.9–3.3)
LYMPH%: 37.6 % (ref 14.0–49.7)
MCH: 30.2 pg (ref 25.1–34.0)
MCHC: 33.7 g/dL (ref 31.5–36.0)
MCV: 89.5 fL (ref 79.5–101.0)
MONO#: 1.1 10*3/uL — ABNORMAL HIGH (ref 0.1–0.9)
MONO%: 11.1 % (ref 0.0–14.0)
NEUT%: 49.1 % (ref 38.4–76.8)
NEUTROS ABS: 4.9 10*3/uL (ref 1.5–6.5)
Platelets: 355 10*3/uL (ref 145–400)
RBC: 4.57 10*6/uL (ref 3.70–5.45)
RDW: 13.9 % (ref 11.2–14.5)
WBC: 9.9 10*3/uL (ref 3.9–10.3)

## 2013-05-08 LAB — VITAMIN D 25 HYDROXY (VIT D DEFICIENCY, FRACTURES): Vit D, 25-Hydroxy: 61 ng/mL (ref 30–89)

## 2013-05-09 ENCOUNTER — Ambulatory Visit (HOSPITAL_BASED_OUTPATIENT_CLINIC_OR_DEPARTMENT_OTHER): Payer: Medicare Other | Admitting: Hematology and Oncology

## 2013-05-09 ENCOUNTER — Encounter: Payer: Self-pay | Admitting: Hematology and Oncology

## 2013-05-09 VITALS — BP 149/87 | HR 69 | Temp 97.7°F | Resp 18 | Ht 64.5 in | Wt 186.1 lb

## 2013-05-09 DIAGNOSIS — R7989 Other specified abnormal findings of blood chemistry: Secondary | ICD-10-CM | POA: Insufficient documentation

## 2013-05-09 DIAGNOSIS — R945 Abnormal results of liver function studies: Secondary | ICD-10-CM

## 2013-05-09 DIAGNOSIS — Z853 Personal history of malignant neoplasm of breast: Secondary | ICD-10-CM

## 2013-05-09 HISTORY — DX: Other specified abnormal findings of blood chemistry: R79.89

## 2013-05-09 NOTE — Progress Notes (Signed)
. ID: Sheila Gardner   DOB: 11-06-1935  MR#: [id number]  CSN#:[id number]  HISTORY OF PRESENT ILLNESS: Sheila Gardner had her routine screening mammography March 11, 2009 at [facility]. This showed a possible mass in the right breast.  She was recalled for additional studies of the right breast on January 7th.  This confirmed a small rounded mass with ill-defined margins, deep in the right breast, which was not palpable by Dr. Joneen Gardner.  Ultrasound showed it to be 8 mm macrolobulated and near a normal-appearing lymph node in the inferior axillary region.  The patient was brought back for ultrasound-guided biopsy on January 12th and this showed ([id number]) an invasive ductal carcinoma which appeared low grade.  The tumor was estrogen and progesterone receptor positive, both at 100% and it had a low MIB-1 at 10%.  There was no evidence of Her-2 amplification with a ratio by CISH of 1.16.    With this information, the patient was referred to Dr. Lucia Gardner and bilateral breast MRIs were obtained January 18th. They showed a solitary area of enhancement in the right breast, measuring up to 1.7 cm.  Otherwise, this was a negative study.  On February 16th, the patient underwent a right lumpectomy and sentinel lymph node biopsy and this showed ([id number]) an invasive ductal carcinoma which measured 8 mm with negative and ample margins and no lymphovascular invasion.  The tumor was Grade 1 and 0 of 3 sentinel lymph nodes were involved. Her subsequent history is as detailed below.  INTERVAL HISTORY: She returns today for followup of her right breast cancer. She has been off anti-estrogen therapy now since approximately June of 2013 and  continues to do well.She denies systemic complains such as cough,abdominal pain bone pain or headache. Patient fell and had an ankle fracture in 2013,she is doing better.    She has been diagnosed with "fatty liver" and we have been following her elevated LFTs which actually are stable  to improved.She was seen previously by GI.She recently had an episode of nausea,vomiting,diarrhea that is now improving. She takes 4000 IU Vit D daily due to prior low level. She had a mammogram in 03/2013. 05/04/2012 Ca 27-29 was normal 16.  REVIEW OF SYSTEMS: Sheila Gardner denies any recent illnesses except as above and has had no fevers or chills. She denies any skin changes. She's had no abnormal bleeding. She's eating and drinking well, with no nausea or change in bowel or bladder habits. She denies, shortness of breath, chest pain, or palpitations. She currently denies any unusual myalgias, arthralgias, bony pain, or peripheral swelling.  A detailed review of systems was otherwise noncontributory.  PAST MEDICAL HISTORY: Past Medical History  Diagnosis Date  . Hyperlipidemia   . GERD (gastroesophageal reflux disease)   . Cancer     Breast- rt  . Arthritis   . Lipoma     back of neck  . Breast cancer   Significant for history of diverticulosis, history of hypercholesterolemia, history of occasional to frequent headaches which by her description are not migraines, history of remote removal of a thyroid mass, history of removal of a colon polyp August of 2004 under Dr. Knox Saliva.  History of degenerative disk disease particularly involving the neck.  History of eardrum replacement about 39 years ago.  History of lens implants for cataracts under Marica Otter.  History of partial ovarian resection bilaterally in 1965.  She had repair of a macular hole in the right eye in December of 2001 under Lorin Glass  Harriott.    PAST SURGICAL HISTORY: Past Surgical History  Procedure Laterality Date  . Tympanoplasty  30 years ago  . Ovary surgery  40 years ago    wedge  . Eye surgery  2001  . Breast surgery  04/22/2009    Rt lumpectomy    FAMILY HISTORY Family History  Problem Relation Age of Onset  . Cancer Brother     colon, pancreatic, brain  The patient's father died at the age of 65 with  dementia.  The patient's mother died at the age of 41 with heart disease.  The patient had one sister who died at the age of one year from a pulmonary problem, and a sister who died in an automobile accident at age 60.  A third sister survives at age 50.  The patient had 11 brothers.  One had pancreatic cancer and two had "brain cancer".  There is no breast or ovarian cancer in the immediate family, although there are some cousins with breast cancer, none diagnosed before the age of 86.    GYNECOLOGIC HISTORY: She is GX P2.  First pregnancy to term at age 16.  Went through the change of life at age 70.  She never took hormone replacement.    SOCIAL HISTORY: She and her husband Sheila Gardner, who is present today, ran a [employer].  He has also done some construction work and her two sons, Sheila Gardner and Sheila Gardner, are both in the building trade.  They both live in [place].  [name] has a daughter, Sheila Gardner, who helps with the [employer] and a daughter, Sheila Gardner, who works in [employer]. The patient has 13 grandchildren.  She attends the [employer].     ADVANCED DIRECTIVES: Not in place  HEALTH MAINTENANCE: History  Substance Use Topics  . Smoking status: Never Smoker   . Smokeless tobacco: Never Used  . Alcohol Use: No     Colonoscopy: UTD  PAP:  Bone density: 08/09/2011, normal  Lipid panel:   No Known Allergies  Current Outpatient Prescriptions  Medication Sig Dispense Refill  . aspirin EC 81 MG tablet Take 81 mg by mouth daily.      . Cholecalciferol (VITAMIN D-3) 1000 UNITS CAPS Take 1 capsule by mouth daily.      . Cyanocobalamin (VITAMIN B-12 PO) Take 1 tablet by mouth daily.       . fish oil-omega-3 fatty acids 1000 MG capsule Take 1 g by mouth daily.      Marland Kitchen FLUoxetine (PROZAC) 10 MG capsule Take 10 mg by mouth daily.      . hydrochlorothiazide (HYDRODIURIL) 25 MG tablet       . meloxicam (MOBIC) 15 MG tablet Take 1 tablet (15 mg total) by mouth daily.  10 tablet  0  .  omeprazole (PRILOSEC) 20 MG capsule Take 20 mg by mouth daily.       . Pyridoxine HCl (VITAMIN B6 PO) Take 1 tablet by mouth daily.       . traMADol (ULTRAM) 50 MG tablet Take 50 mg by mouth every 6 (six) hours as needed. pain      . vitamin C (ASCORBIC ACID) 500 MG tablet Take 500 mg by mouth daily.      Marland Kitchen amoxicillin-clavulanate (AUGMENTIN) 875-125 MG per tablet       . BOOSTRIX 5-2.5-18.5 injection       . methylPREDNIsolone (MEDROL DOSPACK) 4 MG tablet       . valACYclovir (VALTREX) 1000 MG tablet  No current facility-administered medications for this visit.    OBJECTIVE: Middle-aged white woman who appears well Filed Vitals:   05/09/13 1313  BP: 149/87  Pulse: 69  Temp: 97.7 F (36.5 C)  Resp: 18     Body mass index is 31.46 kg/(m^2).    ECOG FS: 0 Filed Weights   05/09/13 1313  Weight: 186 lb 1.6 oz (84.414 kg)   Sclerae unicteric Oropharynx clear neck is supple well healed neck incision from prior          Thyroid surgery. No cervical or supraclavicular adenopathy Lungs clear to auscultation with no rales or rhonchi Heart regular rate and rhythm Abdomen soft, nontender to palpation, positive bowel sounds MSK no focal spinal tenderness, no peripheral edema Neuro: nonfocal, well oriented with positive affect Breasts: The right breast is status post lumpectomy. There is no evidence of local recurrence. The left breast is unremarkable. There is no axillary adenopathy bilaterally.  LAB RESULTS: Lab Results  Component Value Date   WBC 9.9 05/07/2013   NEUTROABS 4.9 05/07/2013   HGB 13.8 05/07/2013   HCT 40.9 05/07/2013   MCV 89.5 05/07/2013   PLT 355 05/07/2013      Chemistry      Component Value Date/Time   NA 139 05/07/2013 1043   NA 141 09/20/2011 0948   K 4.3 05/07/2013 1043   K 4.2 09/20/2011 0948   CL 102 05/04/2012 1313   CL 103 09/20/2011 0948   CO2 28 05/07/2013 1043   CO2 28 09/20/2011 0948   BUN 11.6 05/07/2013 1043   BUN 7 09/20/2011 0948   CREATININE 0.7 05/07/2013  1043   CREATININE 0.54 09/20/2011 0948      Component Value Date/Time   CALCIUM 10.0 05/07/2013 1043   CALCIUM 9.6 09/20/2011 0948   ALKPHOS 73 05/07/2013 1043   ALKPHOS 84 09/20/2011 0948   AST 38* 05/07/2013 1043   AST 76* 09/20/2011 0948   ALT 33 05/07/2013 1043   ALT 68* 09/20/2011 0948   BILITOT 0.55 05/07/2013 1043   BILITOT 0.6 09/20/2011 0948       Lab Results  Component Value Date   LABCA2 16 05/04/2012     STUDIES:  04/02/2013  Mammogram digital with tomosynthesis and CAD at the [facility] was BI-RADS2 benign.   Bone density at [facility] 08/09/2011 was normal.  An MRI of the liver on 03/29/2011 showed hepatic steatosis with fatty sparing in the caudate lobe. There is no evidence of hepatic metastasis at that time.   ASSESSMENT: 78 y.o. [place] woman   (1)  status post right lumpectomy and sentinel lymph node dissection in February 2011 for a T1b N0, stage IA invasive ductal carcinoma, grade 1, strongly estrogen and progesterone receptor positive, HER2/neu negative, with a low MIB-1.    (2)  Decided against radiation therapy.    (3)  On anastrozole from April 2011 until May 2012 with arthralgias developing.    (4)  Started letrozole May 2012, stopped June 2013 again due to arthralgias/myalgias.  (5)  now followed with observation alone  PLAN:  Sapir appears to be doing very well with regards to her breast cancer, and we will  continue annual followups at this time.  She does worry about the elevated LFTs (thought to be secondary to the hepatic steatosis)  but I reassured her liver enzymes actually look better  on her recent labs. She'll then return one year from now, February of 2016, and will have her annual mammogram prior  to that appointment. I told patient that we do not routinely monitor CA 27-29 and that was normal in 2014.Her CA 27-29 had already been cancelled for 2015 and was not reordered.  She knows to call for any other problems that may develop before the next  visit.    Amada Kingfisher, M.D. Oncology/Hematology [place] [phone] (Office)

## 2013-05-10 ENCOUNTER — Telehealth: Payer: Self-pay | Admitting: Oncology

## 2013-05-10 NOTE — Telephone Encounter (Signed)
lvm for pt regarding to Jan and March 2016 appt.....mailed pt appt sched/avs and letter

## 2013-08-22 ENCOUNTER — Encounter (INDEPENDENT_AMBULATORY_CARE_PROVIDER_SITE_OTHER): Payer: Self-pay | Admitting: Surgery

## 2013-10-25 ENCOUNTER — Ambulatory Visit (INDEPENDENT_AMBULATORY_CARE_PROVIDER_SITE_OTHER): Payer: Medicare Other | Admitting: Surgery

## 2013-10-25 ENCOUNTER — Encounter (INDEPENDENT_AMBULATORY_CARE_PROVIDER_SITE_OTHER): Payer: Self-pay | Admitting: Surgery

## 2013-10-25 VITALS — BP 126/72 | HR 68 | Temp 98.0°F | Resp 18 | Ht 64.0 in | Wt 193.0 lb

## 2013-10-25 DIAGNOSIS — Z853 Personal history of malignant neoplasm of breast: Secondary | ICD-10-CM

## 2013-10-25 NOTE — Progress Notes (Signed)
Name:  Sheila Gardner MRN:  [id number] Date of birth: 04/04/1935  ASSESSMENT AND PLAN: 1.  Right breast cancer,  T1, N0  0.8 cm, Grade I, ER - 100%, PR - 100%, Ki-67 - 10%  Lumpectomy 04/22/2009.  No evidence of disease.    Tried anti-estrogen, but did not tolerate them well - so is on no anti-estrogen.  She is beyond 2 years from her surgery.  I will alternate her follow up.  She will see Dr. Jana Hakim in December and I will see her about next June. So I will be her summer doctor.  I will see her back in one year.  At that time she will be beyond 5 years.  So she will go to once a year.  We will see how Dr. Jana Hakim follows her.  2.  She has stopped all anti estrogen meds.  She could not tolerate them.  She took them for maybe 2 1/2 years. 3.  History of cardiomegaly by echo.  Stable cardiac status. 4.  Small mass, posterior right neck.  Excised 10/19/2011 - lipoma.  This incision looks good. 5.  Mildly elevated LFT's.   HISTORY OF PRESENT ILLNESS: Chief Complaint  Patient presents with  . Breast Cancer Long Term Follow Up   Sheila Gardner is a 78 y.o. (DOB: 06-Feb-1936)  white female who is a patient of  Melinda Crutch, MD and comes to me today for follow up right breast breast cancer. She comes by herself.  She is doing well and in good spirits today.  No new complaint or concern.  Path (side, TNM): Right, T1, N0. Surgery: Right lumpectomy/SLNBx   Date: 04/22/2009 Size of tumor: 0.8 cm Nodes: 0/3 ER: 100% PR: 100% Ki67: 10%  HER2Neu: Neg  Medical Oncologist: [place] Oncologist: Pablo Ledger  (she did not undergo rad tx)  Social History: She spends a lot of time with her grand children.   She has 4 children, 2 adopted and 2 not.  She has 2 boys and 2 girls.  She has 15 great grandchildren.  She has 2 great grandchildren  PHYSICAL EXAM: BP 126/72  Pulse 68  Temp(Src) 98 F (36.7 C)  Resp 18  Ht _0  (1.626 m)  Wt 193 lb (87.544 kg)  BMI 33.11 kg/m2  HEENT:  Pupils  equal.  Dentition good.  NECK:  Supple.  No thyroid mass.   The scar on her posterior right neck is okay. LYMPH NODES:  No cervical, supraclavicular, or axillary adenopathy.    BREASTS -  RIGHT:  No palpable mass or nodule.  No nipple discharge.  Scar at 7 o'clock is okay.   LEFT:  No palpable mass or nodule.  No nipple discharge. UPPER EXTREMITIES:  No evidence of lymphedema.  DATA REVIEWED: Mammograms ([facility])- 04/02/2013 - negative  Alphonsa Overall, MD, [facility] Pager: [phone] Office phone:  [phone]

## 2014-03-08 DIAGNOSIS — R058 Other specified cough: Secondary | ICD-10-CM

## 2014-03-08 HISTORY — PX: JOINT REPLACEMENT: SHX530

## 2014-03-08 HISTORY — DX: Other specified cough: R05.8

## 2014-03-16 ENCOUNTER — Telehealth: Payer: Self-pay | Admitting: Oncology

## 2014-03-16 NOTE — Telephone Encounter (Signed)
due to lecture moved f/u from 3/3 w/GM to 3/2 w/HF per GM. s/w pt she is aware.

## 2014-04-30 ENCOUNTER — Ambulatory Visit
Admission: RE | Admit: 2014-04-30 | Discharge: 2014-04-30 | Disposition: A | Discharge status: Home / Self Care | Payer: Medicare Other | Source: Ambulatory Visit | Attending: Hematology and Oncology | Admitting: Hematology and Oncology

## 2014-04-30 DIAGNOSIS — Z853 Personal history of malignant neoplasm of breast: Secondary | ICD-10-CM

## 2014-05-08 ENCOUNTER — Telehealth: Payer: Self-pay | Admitting: Nurse Practitioner

## 2014-05-08 ENCOUNTER — Encounter: Payer: Self-pay | Admitting: Nurse Practitioner

## 2014-05-08 ENCOUNTER — Ambulatory Visit (HOSPITAL_BASED_OUTPATIENT_CLINIC_OR_DEPARTMENT_OTHER): Payer: Medicare Other | Admitting: Nurse Practitioner

## 2014-05-08 ENCOUNTER — Other Ambulatory Visit: Payer: Self-pay | Admitting: Nurse Practitioner

## 2014-05-08 ENCOUNTER — Other Ambulatory Visit (HOSPITAL_BASED_OUTPATIENT_CLINIC_OR_DEPARTMENT_OTHER): Payer: Medicare Other

## 2014-05-08 VITALS — BP 140/56 | HR 69 | Temp 98.1°F | Resp 18 | Ht 64.0 in | Wt 187.4 lb

## 2014-05-08 DIAGNOSIS — Z853 Personal history of malignant neoplasm of breast: Secondary | ICD-10-CM

## 2014-05-08 DIAGNOSIS — M25551 Pain in right hip: Secondary | ICD-10-CM

## 2014-05-08 DIAGNOSIS — M25559 Pain in unspecified hip: Secondary | ICD-10-CM

## 2014-05-08 HISTORY — DX: Pain in unspecified hip: M25.559

## 2014-05-08 LAB — COMPREHENSIVE METABOLIC PANEL (CC13)
ALBUMIN: 4 g/dL (ref 3.5–5.0)
ALT: 21 U/L (ref 0–55)
ANION GAP: 13 meq/L — AB (ref 3–11)
AST: 30 U/L (ref 5–34)
Alkaline Phosphatase: 65 U/L (ref 40–150)
BILIRUBIN TOTAL: 1.15 mg/dL (ref 0.20–1.20)
BUN: 9.1 mg/dL (ref 7.0–26.0)
CO2: 23 mEq/L (ref 22–29)
Calcium: 9.6 mg/dL (ref 8.4–10.4)
Chloride: 100 mEq/L (ref 98–109)
Creatinine: 0.7 mg/dL (ref 0.6–1.1)
EGFR: 86 mL/min/{1.73_m2} — AB (ref >90)
GLUCOSE: 99 mg/dL (ref 70–140)
POTASSIUM: 4.1 meq/L (ref 3.5–5.1)
Sodium: 137 mEq/L (ref 136–145)
TOTAL PROTEIN: 7.5 g/dL (ref 6.4–8.3)

## 2014-05-08 LAB — CBC WITH DIFFERENTIAL/PLATELET
BASO%: 0.6 % (ref 0.0–2.0)
Basophils Absolute: 0.1 10*3/uL (ref 0.0–0.1)
EOS%: 1.8 % (ref 0.0–7.0)
Eosinophils Absolute: 0.2 10*3/uL (ref 0.0–0.5)
HCT: 39.4 % (ref 34.8–46.6)
HGB: 12.8 g/dL (ref 11.6–15.9)
LYMPH#: 3.6 10*3/uL — AB (ref 0.9–3.3)
LYMPH%: 34.2 % (ref 14.0–49.7)
MCH: 28.5 pg (ref 25.1–34.0)
MCHC: 32.5 g/dL (ref 31.5–36.0)
MCV: 87.9 fL (ref 79.5–101.0)
MONO#: 0.9 10*3/uL (ref 0.1–0.9)
MONO%: 8.4 % (ref 0.0–14.0)
NEUT#: 5.8 10*3/uL (ref 1.5–6.5)
NEUT%: 55 % (ref 38.4–76.8)
Platelets: 279 10*3/uL (ref 145–400)
RBC: 4.48 10*6/uL (ref 3.70–5.45)
RDW: 13.6 % (ref 11.2–14.5)
WBC: 10.5 10*3/uL — AB (ref 3.9–10.3)

## 2014-05-08 MED ORDER — TRAMADOL HCL 50 MG PO TABS: 50.0000 mg | ORAL_TABLET | Freq: Four times a day (QID) | ORAL | Status: DC | PRN

## 2014-05-08 NOTE — Telephone Encounter (Signed)
per pof ot sch pt appt-gave pt copy of sch °

## 2014-05-08 NOTE — Progress Notes (Signed)
. ID: Sheila Gardner   DOB: [birth year]  MR#: [id number]  CSN#:[id number]  Patient Care Team: Sheila Crutch, MD as PCP - General (Family Medicine) Sheila Cruel, MD as Consulting Physician (Hematology and Oncology) Sheila Silversmith, MD as Consulting Physician (Radiation Oncology) Sheila Kingfisher, MD as Consulting Physician (Hematology and Oncology)   CHIEF COMPLAINT: hx right breast cancer CURRENT TREATMENT: observation  BREAST CANCER HISTORY: Sheila Gardner had her routine screening mammography March 11, 2009 at [facility]. This showed a possible mass in the right breast.  She was recalled for additional studies of the right breast on January 7th.  This confirmed a small rounded mass with ill-defined margins, deep in the right breast, which was not palpable by Sheila Gardner.  Ultrasound showed it to be 8 mm macrolobulated and near a normal-appearing lymph node in the inferior axillary region.  The patient was brought back for ultrasound-guided biopsy on January 12th and this showed ([id number]) an invasive ductal carcinoma which appeared low grade.  The tumor was estrogen and progesterone receptor positive, both at 100% and it had a low MIB-1 at 10%.  There was no evidence of Her-2 amplification with a ratio by CISH of 1.16.    With this information, the patient was referred to Sheila Gardner and bilateral breast MRIs were obtained January 18th. They showed a solitary area of enhancement in the right breast, measuring up to 1.7 cm.  Otherwise, this was a negative study.  On February 16th, the patient underwent a right lumpectomy and sentinel lymph node biopsy and this showed ([id number]) an invasive ductal carcinoma which measured 8 mm with negative and ample margins and no lymphovascular invasion.  The tumor was Grade 1 and 0 of 3 sentinel lymph nodes were involved. Her subsequent history is as detailed below.  INTERVAL HISTORY: She returns today for follow up of her breast cancer. The interval history is  remarkable for the passing of her husband just 4 weeks ago. This is the first time she has left the house since his funeral. He was treated for pneumonia for 2 weeks, before more in dept scans showed stage IV lung cancer with significant metastasis. Sheila Gardner only had 2 more weeks with her husband from the point of diagnosis until he died. She misses him dearly, but her children come by to check on her regularly. Her appetite is decreased some because she has been depressed. She stopped taking her prozac on her own because her daughter had a hard time weaning off of ativan. She has not experienced any mood changes or withdrawal symptoms so far.   REVIEW OF SYSTEMS: Sheila Gardner denies fevers, chills, nausea, vomiting, or changes in bowel or bladder habits. She has no shortness of breath, chest pain, cough, or palpitations. She is "low energy" during the day, but sleeps ok at night. She denies headaches, dizziness, unexplained weight loss, or night sweats. She has a nagging right hip pain that shoots down her leg, and has been ordered an MRI by her PCP for later this month to evaluate it further. She has been taking tramadol for the pain. A detailed review of systems is otherwise stable.  A detailed review of systems was otherwise noncontributory.  PAST MEDICAL HISTORY: Past Medical History  Diagnosis Date  . Hyperlipidemia   . GERD (gastroesophageal reflux disease)   . Cancer     Breast- rt  . Arthritis   . Lipoma     back of neck  . Breast cancer   Significant for  history of diverticulosis, history of hypercholesterolemia, history of occasional to frequent headaches which by her description are not migraines, history of remote removal of a thyroid mass, history of removal of a colon polyp August of 2004 under Dr. Knox Gardner.  History of degenerative disk disease particularly involving the neck.  History of eardrum replacement about 39 years ago.  History of lens implants for cataracts under Sheila Gardner.   History of partial ovarian resection bilaterally in 1965.  She had repair of a macular hole in the right eye in December of 2001 under Sheila Gardner.    PAST SURGICAL HISTORY: Past Surgical History  Procedure Laterality Date  . Tympanoplasty  30 years ago  . Ovary surgery  40 years ago    wedge  . Eye surgery  2001  . Breast surgery  04/22/2009    Rt lumpectomy    FAMILY HISTORY Family History  Problem Relation Age of Onset  . Cancer Brother     colon, pancreatic, brain  The patient's father died at the age of 62 with dementia.  The patient's mother died at the age of 40 with heart disease.  The patient had one sister who died at the age of one year from a pulmonary problem, and a sister who died in an automobile accident at age 66.  A third sister survives at age 46.  The patient had 11 brothers.  One had pancreatic cancer and two had "brain cancer".  There is no breast or ovarian cancer in the immediate family, although there are some cousins with breast cancer, none diagnosed before the age of 40.    GYNECOLOGIC HISTORY: She is GX P2.  First pregnancy to term at age 44.  Went through the change of life at age 80.  She never took hormone replacement.    SOCIAL HISTORY: She and her husband Sheila Gardner, who is present today, ran a [employer].  He has also done some construction work and her two sons, Sheila Gardner and Sheila Gardner, are both in the building trade.  They both live in [place].  Erial has a daughter, Sheila Gardner, who helps with the [employer] and a daughter, Sheila Gardner, who works in [employer]. The patient has 13 grandchildren.  She attends the [employer]ila Gardner.     ADVANCED DIRECTIVES: Not in place  HEALTH MAINTENANCE: History  Substance Use Topics  . Smoking status: Never Smoker   . Smokeless tobacco: Never Used  . Alcohol Use: No     Colonoscopy: UTD  PAP:  Bone density: 08/09/2011, normal  Lipid panel:   No Known Allergies  Current Outpatient Prescriptions   Medication Sig Dispense Refill  . aspirin EC 81 MG tablet Take 81 mg by mouth daily.    Marland Kitchen BOOSTRIX 5-2.5-18.5 injection     . Cholecalciferol (VITAMIN D-3) 1000 UNITS CAPS Take 1 capsule by mouth daily.    . Cyanocobalamin (VITAMIN B-12 PO) Take 1 tablet by mouth daily.     . fish oil-omega-3 fatty acids 1000 MG capsule Take 1 g by mouth daily.    . hydrochlorothiazide (HYDRODIURIL) 25 MG tablet     . meloxicam (MOBIC) 15 MG tablet Take 1 tablet (15 mg total) by mouth daily. 10 tablet 0  . traMADol (ULTRAM) 50 MG tablet Take 1 tablet (50 mg total) by mouth every 6 (six) hours as needed. pain 10 tablet 0  . vitamin C (ASCORBIC ACID) 500 MG tablet Take 500 mg by mouth daily.    Marland Kitchen  FLUoxetine (PROZAC) 10 MG capsule Take 10 mg by mouth daily.    Marland Kitchen omeprazole (PRILOSEC) 20 MG capsule Take 20 mg by mouth daily.     . Pyridoxine HCl (VITAMIN B6 PO) Take 1 tablet by mouth daily.     . valACYclovir (VALTREX) 1000 MG tablet      No current facility-administered medications for this visit.    OBJECTIVE: Middle-aged white woman who appears well Filed Vitals:   05/08/14 1426  BP: 140/56  Pulse: 69  Temp: 98.1 F (36.7 C)  Resp: 18     Body mass index is 32.15 kg/(m^2).    ECOG FS: 0 Filed Weights   05/08/14 1426  Weight: 187 lb 6.4 oz (85.004 kg)   Skin: warm, dry  HEENT: sclerae anicteric, conjunctivae pink, oropharynx clear. No thrush or mucositis.  Lymph Nodes: No cervical or supraclavicular lymphadenopathy  Lungs: clear to auscultation bilaterally, no rales, wheezes, or rhonci  Heart: regular rate and rhythm  Abdomen: round, soft, non tender, positive bowel sounds  Musculoskeletal: No focal spinal tenderness, no peripheral edema  Neuro: non focal, well oriented, positive affect  Breast: right breast status post lumpectomy. No evidence of recurrent disease. Right axilla benign. Left breast unremarkable.  LAB RESULTS: Lab Results  Component Value Date   WBC 10.5* 05/08/2014    NEUTROABS 5.8 05/08/2014   HGB 12.8 05/08/2014   HCT 39.4 05/08/2014   MCV 87.9 05/08/2014   PLT 279 05/08/2014      Chemistry      Component Value Date/Time   NA 137 05/08/2014 1410   NA 141 09/20/2011 0948   K 4.1 05/08/2014 1410   K 4.2 09/20/2011 0948   CL 102 05/04/2012 1313   CL 103 09/20/2011 0948   CO2 23 05/08/2014 1410   CO2 28 09/20/2011 0948   BUN 9.1 05/08/2014 1410   BUN 7 09/20/2011 0948   CREATININE 0.7 05/08/2014 1410   CREATININE 0.54 09/20/2011 0948      Component Value Date/Time   CALCIUM 9.6 05/08/2014 1410   CALCIUM 9.6 09/20/2011 0948   ALKPHOS 65 05/08/2014 1410   ALKPHOS 84 09/20/2011 0948   AST 30 05/08/2014 1410   AST 76* 09/20/2011 0948   ALT 21 05/08/2014 1410   ALT 68* 09/20/2011 0948   BILITOT 1.15 05/08/2014 1410   BILITOT 0.6 09/20/2011 0948       Lab Results  Component Value Date   LABCA2 16 05/04/2012     STUDIES: Most recent mammogram on 04/30/14 was unremarkable.  ASSESSMENT: 79 y.o. [place] woman   (1)  status post right lumpectomy and sentinel lymph node dissection in February 2011 for a T1b N0, stage IA invasive ductal carcinoma, grade 1, strongly estrogen and progesterone receptor positive, HER2/neu negative, with a low MIB-1.    (2)  Decided against radiation therapy.    (3)  On anastrozole from April 2011 until May 2012 with arthralgias developing.    (4)  Started letrozole May 2012, stopped June 2013 again due to arthralgias/myalgias.  (5)  now followed with observation alone  PLAN:  Physically Raksha is doing well. The labs were reviewed in detail and were entirely stable. Her liver enzymes have normalized. Mentally, she is appropriately grieving for the loss of her husband.   I have sent a one time prescription for 10 tramadol pills to hold her over until she reviews her hip MRI with her PCP.  Caren will return for labs and and office visit in 1  year. Her next mammogram will be due again in February 2017. She  understands and agrees with this plan. She has been encouraged to call with any issues that might arise before her next visit here.   Marcelino Duster, NP Oncology/Hematology [place] [phone] (Office)

## 2014-05-09 ENCOUNTER — Other Ambulatory Visit: Payer: Medicare Other

## 2014-05-09 ENCOUNTER — Ambulatory Visit: Payer: Medicare Other | Admitting: Oncology

## 2014-05-09 LAB — VITAMIN D 25 HYDROXY (VIT D DEFICIENCY, FRACTURES): Vit D, 25-Hydroxy: 29 ng/mL — ABNORMAL LOW (ref 30–100)

## 2014-05-09 NOTE — Addendum Note (Signed)
Addended by: Marcelino Duster on: 05/09/2014 04:44 PM   Modules accepted: Orders

## 2014-05-10 ENCOUNTER — Telehealth: Payer: Self-pay | Admitting: *Deleted

## 2014-05-10 NOTE — Telephone Encounter (Signed)
Patient called to ask about all abnormal values in the CBC and CMET reports that she was given at her office visit.  We went through each abnormal value and reassured her that the slight out of range values were not of concern.  She was reassured.  She wanted to know why her Vitamin D level was a lower than one year ago.  She is not taking Vitamin D currently, but was doing so one year ago.  Suggested she might want to consider restarting the Vitamin D supplement.  She was appreciative for the advise.

## 2014-05-27 ENCOUNTER — Other Ambulatory Visit: Payer: Self-pay | Admitting: Radiology

## 2014-05-27 ENCOUNTER — Other Ambulatory Visit: Payer: Self-pay | Admitting: Orthopedic Surgery

## 2014-05-27 DIAGNOSIS — M25551 Pain in right hip: Secondary | ICD-10-CM

## 2014-05-29 ENCOUNTER — Ambulatory Visit
Admission: RE | Admit: 2014-05-29 | Discharge: 2014-05-29 | Disposition: A | Discharge status: Home / Self Care | Payer: Medicare Other | Source: Ambulatory Visit | Attending: Orthopedic Surgery | Admitting: Orthopedic Surgery

## 2014-05-29 DIAGNOSIS — M25551 Pain in right hip: Secondary | ICD-10-CM

## 2014-05-29 MED ORDER — IOHEXOL 180 MG/ML  SOLN
1.0000 mL | Freq: Once | INTRAMUSCULAR | Status: AC | PRN
  Administered 2014-05-29: 1 mL via INTRA_ARTICULAR

## 2014-05-29 MED ORDER — METHYLPREDNISOLONE ACETATE 40 MG/ML INJ SUSP (RADIOLOG
120.0000 mg | Freq: Once | INTRAMUSCULAR | Status: AC
  Administered 2014-05-29: 120 mg via INTRA_ARTICULAR

## 2014-06-13 ENCOUNTER — Other Ambulatory Visit: Payer: Self-pay | Admitting: Nurse Practitioner

## 2014-07-18 ENCOUNTER — Other Ambulatory Visit: Payer: Self-pay | Admitting: Orthopaedic Surgery

## 2014-07-19 ENCOUNTER — Ambulatory Visit (HOSPITAL_COMMUNITY)
Admission: RE | Admit: 2014-07-19 | Discharge: 2014-07-19 | Disposition: A | Discharge status: Home / Self Care | Payer: Medicare Other | Source: Ambulatory Visit | Attending: Orthopaedic Surgery | Admitting: Orthopaedic Surgery

## 2014-07-19 ENCOUNTER — Encounter (HOSPITAL_COMMUNITY): Payer: Self-pay

## 2014-07-19 ENCOUNTER — Encounter (HOSPITAL_COMMUNITY)
Admission: RE | Admit: 2014-07-19 | Discharge: 2014-07-19 | Disposition: A | Discharge status: Home / Self Care | Payer: Medicare Other | Source: Ambulatory Visit | Attending: Orthopaedic Surgery | Admitting: Orthopaedic Surgery

## 2014-07-19 DIAGNOSIS — Z01818 Encounter for other preprocedural examination: Secondary | ICD-10-CM

## 2014-07-19 HISTORY — DX: Nausea with vomiting, unspecified: Z98.890

## 2014-07-19 HISTORY — DX: Nausea with vomiting, unspecified: R11.2

## 2014-07-19 HISTORY — DX: Essential (primary) hypertension: I10

## 2014-07-19 HISTORY — DX: Cough: R05

## 2014-07-19 LAB — BASIC METABOLIC PANEL
ANION GAP: 12 (ref 5–15)
BUN: 12 mg/dL (ref 6–20)
CO2: 23 mmol/L (ref 22–32)
Calcium: 9.7 mg/dL (ref 8.9–10.3)
Chloride: 97 mmol/L — ABNORMAL LOW (ref 101–111)
Creatinine, Ser: 0.72 mg/dL (ref 0.44–1.00)
GFR calc Af Amer: >60 mL/min (ref >60)
GFR calc non Af Amer: >60 mL/min (ref >60)
GLUCOSE: 135 mg/dL — AB (ref 65–99)
Potassium: 4.1 mmol/L (ref 3.5–5.1)
SODIUM: 132 mmol/L — AB (ref 135–145)

## 2014-07-19 LAB — TYPE AND SCREEN
ABO/RH(D): B POS
ANTIBODY SCREEN: NEGATIVE

## 2014-07-19 LAB — URINALYSIS, ROUTINE W REFLEX MICROSCOPIC
Bilirubin Urine: NEGATIVE
GLUCOSE, UA: NEGATIVE mg/dL
Hgb urine dipstick: NEGATIVE
Ketones, ur: NEGATIVE mg/dL
Nitrite: NEGATIVE
Protein, ur: NEGATIVE mg/dL
Specific Gravity, Urine: 1.011 (ref 1.005–1.030)
UROBILINOGEN UA: 0.2 mg/dL (ref 0.0–1.0)
pH: 5 (ref 5.0–8.0)

## 2014-07-19 LAB — CBC WITH DIFFERENTIAL/PLATELET
BASOS ABS: 0 10*3/uL (ref 0.0–0.1)
BASOS PCT: 0 % (ref 0–1)
EOS PCT: 3 % (ref 0–5)
Eosinophils Absolute: 0.2 10*3/uL (ref 0.0–0.7)
HEMATOCRIT: 35.6 % — AB (ref 36.0–46.0)
Hemoglobin: 12.2 g/dL (ref 12.0–15.0)
LYMPHS ABS: 2.9 10*3/uL (ref 0.7–4.0)
Lymphocytes Relative: 40 % (ref 12–46)
MCH: 30.2 pg (ref 26.0–34.0)
MCHC: 34.3 g/dL (ref 30.0–36.0)
MCV: 88.1 fL (ref 78.0–100.0)
Monocytes Absolute: 0.6 10*3/uL (ref 0.1–1.0)
Monocytes Relative: 8 % (ref 3–12)
NEUTROS PCT: 49 % (ref 43–77)
Neutro Abs: 3.6 10*3/uL (ref 1.7–7.7)
PLATELETS: 254 10*3/uL (ref 150–400)
RBC: 4.04 MIL/uL (ref 3.87–5.11)
RDW: 14.1 % (ref 11.5–15.5)
WBC: 7.3 10*3/uL (ref 4.0–10.5)

## 2014-07-19 LAB — APTT: APTT: 31 s (ref 24–37)

## 2014-07-19 LAB — PROTIME-INR
INR: 1.01 (ref 0.00–1.49)
Prothrombin Time: 13.4 seconds (ref 11.6–15.2)

## 2014-07-19 LAB — ABO/RH: ABO/RH(D): B POS

## 2014-07-19 LAB — URINE MICROSCOPIC-ADD ON

## 2014-07-19 LAB — SURGICAL PCR SCREEN
MRSA, PCR: NEGATIVE
STAPHYLOCOCCUS AUREUS: NEGATIVE

## 2014-07-19 NOTE — Progress Notes (Signed)
REQUESTED PREOP ORDERS FROM DR. DALLDORF'S OFFICE.

## 2014-07-19 NOTE — Pre-Procedure Instructions (Signed)
Sheila Gardner  07/19/2014   Your procedure is scheduled on: Tuesday  07/30/14  Report to [facility] Admitting at 1045 AM.  Call this number if you have problems the morning of surgery: [phone]   Remember:   Do not eat food or drink liquids after midnight.   Take these medicines the morning of surgery with A SIP OF WATER:  FLUOXETINE(PROZAC), OMEPRAZOLE, OXYCODONE OR TRAMADOL IF NEEDED (STOP MELOXICAM/MOBIC AND FISH OIL)   Do not wear jewelry, make-up or nail polish.  Do not wear lotions, powders, or perfumes. You may wear deodorant.  Do not shave 48 hours prior to surgery. Men may shave face and neck.  Do not bring valuables to the hospital.  [facility] is not responsible                  for any belongings or valuables.               Contacts, dentures or bridgework may not be worn into surgery.  Leave suitcase in the car. After surgery it may be brought to your room.  For patients admitted to the hospital, discharge time is determined by your                treatment team.               Patients discharged the day of surgery will not be allowed to drive  home.  Name and phone number of your driver:   Special Instructions: [facility] - Preparing for Surgery  Before surgery, you can play an important role.  Because skin is not sterile, your skin needs to be as free of germs as possible.  You can reduce the number of germs on you skin by washing with CHG (chlorahexidine gluconate) soap before surgery.  CHG is an antiseptic cleaner which kills germs and bonds with the skin to continue killing germs even after washing.  Please DO NOT use if you have an allergy to CHG or antibacterial soaps.  If your skin becomes reddened/irritated stop using the CHG and inform your nurse when you arrive at Short Stay.  Do not shave (including legs and underarms) for at least 48 hours prior to the first CHG shower.  You may shave your face.  Please follow these instructions  carefully:   1.  Shower with CHG Soap the night before surgery and the                                morning of Surgery.  2.  If you choose to wash your hair, wash your hair first as usual with your       normal shampoo.  3.  After you shampoo, rinse your hair and body thoroughly to remove the                      Shampoo.  4.  Use CHG as you would any other liquid soap.  You can apply chg directly       to the skin and wash gently with scrungie or a clean washcloth.  5.  Apply the CHG Soap to your body ONLY FROM THE NECK DOWN.        Do not use on open wounds or open sores.  Avoid contact with your eyes,       ears, mouth and genitals (private parts).  Wash  genitals (private parts)       with your normal soap.  6.  Wash thoroughly, paying special attention to the area where your surgery        will be performed.  7.  Thoroughly rinse your body with warm water from the neck down.  8.  DO NOT shower/wash with your normal soap after using and rinsing off       the CHG Soap.  9.  Pat yourself dry with a clean towel.            10.  Wear clean pajamas.            11.  Place clean sheets on your bed the night of your first shower and do not        sleep with pets.  Day of Surgery  Do not apply any lotions/deoderants the morning of surgery.  Please wear clean clothes to the hospital/surgery center.     Please read over the following fact sheets that you were given: Pain Booklet, Coughing and Deep Breathing, Blood Transfusion Information, MRSA Information and Surgical Site Infection Prevention

## 2014-07-22 NOTE — Progress Notes (Signed)
Anesthesia Chart Review:  Pt is 79 year old female scheduled for R total hip arthroplasty on 07/30/2014 with Dr. Rhona Raider.   PMH includes: HTN, hyperlipidemia, breast cancer. Never smoker. BMI 31.   Preoperative labs reviewed.    Chest x-ray reviewed. No active cardiopulmonary disease.   EKG: NSR.   If no changes, I anticipate pt can proceed with surgery as scheduled.   Willeen Cass, FNP-BC [facility] Short Stay Surgical Center/Anesthesiology Phone: [phone] 07/22/2014 2:59 PM

## 2014-07-26 ENCOUNTER — Telehealth: Payer: Self-pay | Admitting: *Deleted

## 2014-07-26 ENCOUNTER — Other Ambulatory Visit (HOSPITAL_COMMUNITY): Payer: Medicare Other

## 2014-07-26 NOTE — Telephone Encounter (Signed)
VM message from pt inquiring about her last pneumonia vaccine.  According to her chart , she has not had one. Attempted call back to # specified. No answer. Unable to leave vm

## 2014-07-26 NOTE — H&P (Signed)
TOTAL HIP ADMISSION H&P  Patient is admitted for right total hip arthroplasty.  Subjective:  Chief Complaint: right hip pain  HPI: Sheila Gardner, 79 y.o. female, has a history of pain and functional disability in the right hip(s) due to arthritis and patient has failed non-surgical conservative treatments for greater than 12 weeks to include NSAID's and/or analgesics, corticosteriod injections, flexibility and strengthening excercises, use of assistive devices, weight reduction as appropriate and activity modification.  Onset of symptoms was gradual starting 5 years ago with gradually worsening course since that time.The patient noted no past surgery on the right hip(s).  Patient currently rates pain in the right hip at 10 out of 10 with activity. Patient has night pain, worsening of pain with activity and weight bearing, trendelenberg gait, pain that interfers with activities of daily living and crepitus. Patient has evidence of subchondral cysts, subchondral sclerosis, periarticular osteophytes and joint space narrowing by imaging studies. This condition presents safety issues increasing the risk of falls. There is no current active infection.  Patient Active Problem List   Diagnosis Date Noted  . Hip pain 05/08/2014  . Elevated liver function tests 05/09/2013  . Vitamin D deficiency 03/25/2011  . History of breast cancer, Right, T1, N0, 04/22/2009 12/30/2010   Past Medical History  Diagnosis Date  . Hyperlipidemia   . Cancer     Breast- rt  . Arthritis   . Lipoma     back of neck  . Breast cancer   . Hypertension   . PONV (postoperative nausea and vomiting)   . GERD (gastroesophageal reflux disease)     COSTOCHONDRITIS  . Cough with sputum     Past Surgical History  Procedure Laterality Date  . Tympanoplasty  30 years ago  . Ovary surgery  40 years ago    wedge  . Eye surgery  2001  . Breast surgery  04/22/2009    Rt lumpectomy  . Ankle fracture surgery      RIGHT   2.5 YRS  AGO     No prescriptions prior to admission   No Known Allergies  History  Substance Use Topics  . Smoking status: Never Smoker   . Smokeless tobacco: Never Used  . Alcohol Use: Yes     Comment: OCC    Family History  Problem Relation Age of Onset  . Cancer Brother     colon, pancreatic, brain     Review of Systems  Musculoskeletal: Positive for joint pain.       Right hip  All other systems reviewed and are negative.   Objective:  Physical Exam  Constitutional: She is oriented to person, place, and time. She appears well-developed and well-nourished.  HENT:  Head: Normocephalic and atraumatic.  Eyes: Pupils are equal, round, and reactive to light.  Neck: Normal range of motion.  Cardiovascular: Normal rate and regular rhythm.   Respiratory: Effort normal.  GI: Soft.  Musculoskeletal:  Right hip motion is significantly painful in internal rotation.  Straight leg raise causes some low back pain with mild radiation into her thigh.  Opposite hip moves well.  She walks with an altered gait leaning towards the left.  Sensation and motor function are intact in her feet with palpable pulses on both sides.  There is no palpable lymphadenopathy about her groin.    Neurological: She is alert and oriented to person, place, and time.  Skin: Skin is warm and dry.  Psychiatric: She has a normal mood and affect.  Her behavior is normal. Judgment and thought content normal.    Vital signs in last 24 hours:    Labs:   Estimated body mass index is 32.15 kg/(m^2) as calculated from the following:   Height as of 05/08/14: 5\' 4"  (1.626 m).   Weight as of 05/08/14: 85.004 kg (187 lb 6.4 oz).   Imaging Review Plain radiographs demonstrate severe degenerative joint disease of the right hip(s). The bone quality appears to be good for age and reported activity level.  Assessment/Plan:  End stage primary arthritis, right hip(s)  The patient history, physical examination, clinical  judgement of the provider and imaging studies are consistent with end stage degenerative joint disease of the right hip(s) and total hip arthroplasty is deemed medically necessary. The treatment options including medical management, injection therapy, arthroscopy and arthroplasty were discussed at length. The risks and benefits of total hip arthroplasty were presented and reviewed. The risks due to aseptic loosening, infection, stiffness, dislocation/subluxation,  thromboembolic complications and other imponderables were discussed.  The patient acknowledged the explanation, agreed to proceed with the plan and consent was signed. Patient is being admitted for inpatient treatment for surgery, pain control, PT, OT, prophylactic antibiotics, VTE prophylaxis, progressive ambulation and ADL's and discharge planning.The patient is planning to be discharged home with home health services

## 2014-07-29 NOTE — Progress Notes (Signed)
Patient notified of time change, was instructed to arrive at 900 am 07/30/14.

## 2014-07-30 ENCOUNTER — Inpatient Hospital Stay (HOSPITAL_COMMUNITY): Payer: Medicare Other | Admitting: Anesthesiology

## 2014-07-30 ENCOUNTER — Encounter (HOSPITAL_COMMUNITY): Payer: Self-pay | Admitting: Anesthesiology

## 2014-07-30 ENCOUNTER — Inpatient Hospital Stay (HOSPITAL_COMMUNITY): Payer: Medicare Other

## 2014-07-30 ENCOUNTER — Inpatient Hospital Stay (HOSPITAL_COMMUNITY): Payer: Medicare Other | Admitting: Emergency Medicine

## 2014-07-30 ENCOUNTER — Encounter (HOSPITAL_COMMUNITY)
Admission: RE | Disposition: A | Discharge status: Skilled Nursing Facility | Payer: Self-pay | Source: Ambulatory Visit | Attending: Orthopaedic Surgery

## 2014-07-30 ENCOUNTER — Inpatient Hospital Stay (HOSPITAL_COMMUNITY)
Admission: RE | Admit: 2014-07-30 | Discharge: 2014-08-01 | DRG: 470 | Disposition: A | Discharge status: Skilled Nursing Facility | Payer: Medicare Other | Source: Ambulatory Visit | Attending: Orthopaedic Surgery | Admitting: Orthopaedic Surgery

## 2014-07-30 DIAGNOSIS — K219 Gastro-esophageal reflux disease without esophagitis: Secondary | ICD-10-CM | POA: Diagnosis present

## 2014-07-30 DIAGNOSIS — I10 Essential (primary) hypertension: Secondary | ICD-10-CM | POA: Diagnosis present

## 2014-07-30 DIAGNOSIS — Z419 Encounter for procedure for purposes other than remedying health state, unspecified: Secondary | ICD-10-CM

## 2014-07-30 DIAGNOSIS — M1611 Unilateral primary osteoarthritis, right hip: Secondary | ICD-10-CM

## 2014-07-30 DIAGNOSIS — E559 Vitamin D deficiency, unspecified: Secondary | ICD-10-CM | POA: Diagnosis present

## 2014-07-30 DIAGNOSIS — Z853 Personal history of malignant neoplasm of breast: Secondary | ICD-10-CM | POA: Diagnosis not present

## 2014-07-30 DIAGNOSIS — E785 Hyperlipidemia, unspecified: Secondary | ICD-10-CM | POA: Diagnosis present

## 2014-07-30 HISTORY — DX: Unilateral primary osteoarthritis, right hip: M16.11

## 2014-07-30 HISTORY — PX: TOTAL HIP ARTHROPLASTY: SHX124

## 2014-07-30 SURGERY — ARTHROPLASTY, HIP, TOTAL, ANTERIOR APPROACH
Anesthesia: Monitor Anesthesia Care | Laterality: Right

## 2014-07-30 MED ORDER — MIDAZOLAM HCL 5 MG/5ML IJ SOLN
INTRAMUSCULAR | Status: DC | PRN
  Administered 2014-07-30: 2 mg via INTRAVENOUS

## 2014-07-30 MED ORDER — OXYCODONE HCL 5 MG PO TABS
5.0000 mg | ORAL_TABLET | ORAL | Status: DC | PRN
  Administered 2014-07-30 – 2014-08-01 (×11): 10 mg via ORAL
  Filled 2014-07-30 (×12): qty 2

## 2014-07-30 MED ORDER — CEFAZOLIN SODIUM-DEXTROSE 2-3 GM-% IV SOLR
2.0000 g | INTRAVENOUS | Status: AC
  Administered 2014-07-30: 2 g via INTRAVENOUS
  Filled 2014-07-30: qty 50

## 2014-07-30 MED ORDER — BUPIVACAINE HCL (PF) 0.75 % IJ SOLN
INTRAMUSCULAR | Status: DC | PRN
  Administered 2014-07-30: 1.6 mL via INTRATHECAL

## 2014-07-30 MED ORDER — CEFAZOLIN SODIUM-DEXTROSE 2-3 GM-% IV SOLR
2.0000 g | Freq: Four times a day (QID) | INTRAVENOUS | Status: AC
  Administered 2014-07-30 – 2014-07-31 (×2): 2 g via INTRAVENOUS
  Filled 2014-07-30 (×2): qty 50

## 2014-07-30 MED ORDER — ONDANSETRON HCL 4 MG/2ML IJ SOLN
INTRAMUSCULAR | Status: AC
  Filled 2014-07-30: qty 2

## 2014-07-30 MED ORDER — PROMETHAZINE HCL 25 MG PO TABS
12.5000 mg | ORAL_TABLET | ORAL | Status: DC | PRN
  Administered 2014-07-30: 12.5 mg via ORAL
  Filled 2014-07-30: qty 1

## 2014-07-30 MED ORDER — LISINOPRIL 20 MG PO TABS
20.0000 mg | ORAL_TABLET | Freq: Every day | ORAL | Status: DC
  Filled 2014-07-30: qty 1

## 2014-07-30 MED ORDER — LACTATED RINGERS IV SOLN
INTRAVENOUS | Status: DC
  Administered 2014-07-30: 10:00:00 via INTRAVENOUS

## 2014-07-30 MED ORDER — ACETAMINOPHEN 650 MG RE SUPP: 650.0000 mg | Freq: Four times a day (QID) | RECTAL | Status: DC | PRN

## 2014-07-30 MED ORDER — TRANEXAMIC ACID 1000 MG/10ML IV SOLN
2000.0000 mg | Freq: Once | INTRAVENOUS | Status: AC
  Administered 2014-07-30: 2000 mg via TOPICAL
  Filled 2014-07-30: qty 20

## 2014-07-30 MED ORDER — DIPHENHYDRAMINE HCL 12.5 MG/5ML PO ELIX: 12.5000 mg | ORAL_SOLUTION | ORAL | Status: DC | PRN

## 2014-07-30 MED ORDER — ALUM & MAG HYDROXIDE-SIMETH 200-200-20 MG/5ML PO SUSP: 30.0000 mL | ORAL | Status: DC | PRN

## 2014-07-30 MED ORDER — LACTATED RINGERS IV SOLN
INTRAVENOUS | Status: DC
  Administered 2014-07-31: via INTRAVENOUS

## 2014-07-30 MED ORDER — LACTATED RINGERS IV SOLN
INTRAVENOUS | Status: DC | PRN
  Administered 2014-07-30 (×2): via INTRAVENOUS

## 2014-07-30 MED ORDER — MIDAZOLAM HCL 2 MG/2ML IJ SOLN
INTRAMUSCULAR | Status: AC
  Filled 2014-07-30: qty 2

## 2014-07-30 MED ORDER — ALBUMIN HUMAN 5 % IV SOLN
INTRAVENOUS | Status: DC | PRN
  Administered 2014-07-30: 12:00:00 via INTRAVENOUS

## 2014-07-30 MED ORDER — CHLORHEXIDINE GLUCONATE 4 % EX LIQD: 60.0000 mL | Freq: Once | CUTANEOUS | Status: DC

## 2014-07-30 MED ORDER — DOCUSATE SODIUM 100 MG PO CAPS
100.0000 mg | ORAL_CAPSULE | Freq: Two times a day (BID) | ORAL | Status: DC
Start: 2014-07-30 — End: 2014-08-01
  Administered 2014-07-30 – 2014-08-01 (×4): 100 mg via ORAL
  Filled 2014-07-30 (×4): qty 1

## 2014-07-30 MED ORDER — ACETAMINOPHEN 325 MG PO TABS
650.0000 mg | ORAL_TABLET | Freq: Four times a day (QID) | ORAL | Status: DC | PRN
  Administered 2014-07-30 – 2014-07-31 (×2): 650 mg via ORAL
  Filled 2014-07-30 (×3): qty 2

## 2014-07-30 MED ORDER — PHENYLEPHRINE HCL 10 MG/ML IJ SOLN
INTRAMUSCULAR | Status: DC | PRN
Start: 2014-07-30 — End: 2014-07-30
  Administered 2014-07-30 (×2): 40 ug via INTRAVENOUS
  Administered 2014-07-30 (×4): 80 ug via INTRAVENOUS

## 2014-07-30 MED ORDER — METHOCARBAMOL 500 MG PO TABS
500.0000 mg | ORAL_TABLET | Freq: Four times a day (QID) | ORAL | Status: DC | PRN
  Administered 2014-07-30 – 2014-08-01 (×5): 500 mg via ORAL
  Filled 2014-07-30 (×6): qty 1

## 2014-07-30 MED ORDER — HYDROCHLOROTHIAZIDE 12.5 MG PO CAPS
12.5000 mg | ORAL_CAPSULE | Freq: Every day | ORAL | Status: DC
  Filled 2014-07-30: qty 1

## 2014-07-30 MED ORDER — DEXTROSE 5 % IV SOLN
10.0000 mg | INTRAVENOUS | Status: DC | PRN
  Administered 2014-07-30: 30 ug/min via INTRAVENOUS

## 2014-07-30 MED ORDER — LIDOCAINE HCL (CARDIAC) 20 MG/ML IV SOLN
INTRAVENOUS | Status: DC | PRN
  Administered 2014-07-30: 50 mg via INTRAVENOUS

## 2014-07-30 MED ORDER — PANTOPRAZOLE SODIUM 40 MG PO TBEC
40.0000 mg | DELAYED_RELEASE_TABLET | Freq: Every day | ORAL | Status: DC
  Administered 2014-07-31 – 2014-08-01 (×2): 40 mg via ORAL
  Filled 2014-07-30 (×2): qty 1

## 2014-07-30 MED ORDER — FENTANYL CITRATE (PF) 100 MCG/2ML IJ SOLN: 25.0000 ug | INTRAMUSCULAR | Status: DC | PRN

## 2014-07-30 MED ORDER — PHENOL 1.4 % MT LIQD: 1.0000 | OROMUCOSAL | Status: DC | PRN

## 2014-07-30 MED ORDER — BISACODYL 5 MG PO TBEC: 5.0000 mg | DELAYED_RELEASE_TABLET | Freq: Every day | ORAL | Status: DC | PRN

## 2014-07-30 MED ORDER — OXYCODONE HCL 5 MG PO TABS: 5.0000 mg | ORAL_TABLET | Freq: Once | ORAL | Status: DC | PRN

## 2014-07-30 MED ORDER — 0.9 % SODIUM CHLORIDE (POUR BTL) OPTIME
TOPICAL | Status: DC | PRN
  Administered 2014-07-30: 1000 mL

## 2014-07-30 MED ORDER — ONDANSETRON HCL 4 MG/2ML IJ SOLN
4.0000 mg | Freq: Four times a day (QID) | INTRAMUSCULAR | Status: DC | PRN
  Administered 2014-08-01: 4 mg via INTRAVENOUS
  Filled 2014-07-30: qty 2

## 2014-07-30 MED ORDER — ONDANSETRON HCL 4 MG PO TABS
4.0000 mg | ORAL_TABLET | Freq: Four times a day (QID) | ORAL | Status: DC | PRN
  Administered 2014-07-31 – 2014-08-01 (×3): 4 mg via ORAL
  Filled 2014-07-30 (×3): qty 1

## 2014-07-30 MED ORDER — METOCLOPRAMIDE HCL 5 MG PO TABS: 5.0000 mg | ORAL_TABLET | Freq: Three times a day (TID) | ORAL | Status: DC | PRN

## 2014-07-30 MED ORDER — ONDANSETRON HCL 4 MG/2ML IJ SOLN
INTRAMUSCULAR | Status: DC | PRN
Start: 2014-07-30 — End: 2014-07-30
  Administered 2014-07-30: 4 mg via INTRAVENOUS

## 2014-07-30 MED ORDER — PROPOFOL INFUSION 10 MG/ML OPTIME
INTRAVENOUS | Status: DC | PRN
  Administered 2014-07-30: 12:00:00 via INTRAVENOUS
  Administered 2014-07-30: 75 ug/kg/min via INTRAVENOUS

## 2014-07-30 MED ORDER — MENTHOL 3 MG MT LOZG: 1.0000 | LOZENGE | OROMUCOSAL | Status: DC | PRN

## 2014-07-30 MED ORDER — METHOCARBAMOL 1000 MG/10ML IJ SOLN
500.0000 mg | Freq: Four times a day (QID) | INTRAVENOUS | Status: DC | PRN
  Filled 2014-07-30: qty 5

## 2014-07-30 MED ORDER — OXYCODONE HCL 5 MG/5ML PO SOLN: 5.0000 mg | Freq: Once | ORAL | Status: DC | PRN

## 2014-07-30 MED ORDER — LIDOCAINE HCL (CARDIAC) 20 MG/ML IV SOLN
INTRAVENOUS | Status: AC
  Filled 2014-07-30: qty 5

## 2014-07-30 MED ORDER — ONDANSETRON HCL 4 MG/2ML IJ SOLN: 4.0000 mg | Freq: Four times a day (QID) | INTRAMUSCULAR | Status: DC | PRN

## 2014-07-30 MED ORDER — FENTANYL CITRATE (PF) 100 MCG/2ML IJ SOLN
50.0000 ug | INTRAMUSCULAR | Status: DC | PRN
  Administered 2014-07-30: 50 ug via INTRAVENOUS

## 2014-07-30 MED ORDER — ASPIRIN EC 325 MG PO TBEC
325.0000 mg | DELAYED_RELEASE_TABLET | Freq: Two times a day (BID) | ORAL | Status: DC
  Administered 2014-07-31 – 2014-08-01 (×3): 325 mg via ORAL
  Filled 2014-07-30 (×3): qty 1

## 2014-07-30 MED ORDER — METOCLOPRAMIDE HCL 5 MG/ML IJ SOLN
5.0000 mg | Freq: Three times a day (TID) | INTRAMUSCULAR | Status: DC | PRN
  Administered 2014-08-01: 10 mg via INTRAVENOUS
  Filled 2014-07-30: qty 2

## 2014-07-30 MED ORDER — HYDROMORPHONE HCL 1 MG/ML IJ SOLN
0.5000 mg | INTRAMUSCULAR | Status: DC | PRN
  Administered 2014-07-30: 1 mg via INTRAVENOUS
  Administered 2014-07-30 (×2): 0.5 mg via INTRAVENOUS
  Administered 2014-07-31 (×2): 1 mg via INTRAVENOUS
  Filled 2014-07-30 (×5): qty 1

## 2014-07-30 MED ORDER — FENTANYL CITRATE (PF) 100 MCG/2ML IJ SOLN
INTRAMUSCULAR | Status: AC
  Administered 2014-07-30: 50 ug via INTRAVENOUS
  Filled 2014-07-30: qty 2

## 2014-07-30 MED ORDER — PHENYLEPHRINE 40 MCG/ML (10ML) SYRINGE FOR IV PUSH (FOR BLOOD PRESSURE SUPPORT)
PREFILLED_SYRINGE | INTRAVENOUS | Status: AC
  Filled 2014-07-30: qty 10

## 2014-07-30 MED ORDER — FENTANYL CITRATE (PF) 250 MCG/5ML IJ SOLN
INTRAMUSCULAR | Status: AC
  Filled 2014-07-30: qty 5

## 2014-07-30 MED ORDER — LISINOPRIL-HYDROCHLOROTHIAZIDE 20-12.5 MG PO TABS: 1.0000 | ORAL_TABLET | Freq: Every day | ORAL | Status: DC

## 2014-07-30 SURGICAL SUPPLY — 51 items
BLADE SAW SGTL 18X1.27X75 (BLADE) ×2 IMPLANT
BLADE SURG ROTATE 9660 (MISCELLANEOUS) IMPLANT
CAPT HIP TOTAL 2 ×2 IMPLANT
CELLS DAT CNTRL 66122 CELL SVR (MISCELLANEOUS) ×1 IMPLANT
COVER PERINEAL POST (MISCELLANEOUS) ×2 IMPLANT
COVER SURGICAL LIGHT HANDLE (MISCELLANEOUS) ×2 IMPLANT
DRAPE C-ARM 42X72 X-RAY (DRAPES) ×2 IMPLANT
DRAPE IMP U-DRAPE 54X76 (DRAPES) ×2 IMPLANT
DRAPE STERI IOBAN 125X83 (DRAPES) ×2 IMPLANT
DRAPE U-SHAPE 47X51 STRL (DRAPES) ×6 IMPLANT
DRSG AQUACEL AG ADV 3.5X10 (GAUZE/BANDAGES/DRESSINGS) ×2 IMPLANT
DURAPREP 26ML APPLICATOR (WOUND CARE) ×2 IMPLANT
ELECT BLADE 4.0 EZ CLEAN MEGAD (MISCELLANEOUS)
ELECT CAUTERY BLADE 6.4 (BLADE) ×2 IMPLANT
ELECT REM PT RETURN 9FT ADLT (ELECTROSURGICAL) ×2
ELECTRODE BLDE 4.0 EZ CLN MEGD (MISCELLANEOUS) IMPLANT
ELECTRODE REM PT RTRN 9FT ADLT (ELECTROSURGICAL) ×1 IMPLANT
FACESHIELD WRAPAROUND (MASK) ×4 IMPLANT
GLOVE BIO SURGEON STRL SZ8 (GLOVE) ×10 IMPLANT
GLOVE BIOGEL PI IND STRL 6.5 (GLOVE) ×1 IMPLANT
GLOVE BIOGEL PI IND STRL 8 (GLOVE) ×3 IMPLANT
GLOVE BIOGEL PI INDICATOR 6.5 (GLOVE) ×1
GLOVE BIOGEL PI INDICATOR 8 (GLOVE) ×3
GLOVE ECLIPSE 6.5 STRL STRAW (GLOVE) ×2 IMPLANT
GLOVE ECLIPSE 7.5 STRL STRAW (GLOVE) ×4 IMPLANT
GOWN STRL REUS W/ TWL LRG LVL3 (GOWN DISPOSABLE) ×1 IMPLANT
GOWN STRL REUS W/ TWL XL LVL3 (GOWN DISPOSABLE) ×5 IMPLANT
GOWN STRL REUS W/TWL LRG LVL3 (GOWN DISPOSABLE) ×1
GOWN STRL REUS W/TWL XL LVL3 (GOWN DISPOSABLE) ×5
KIT BASIN OR (CUSTOM PROCEDURE TRAY) ×2 IMPLANT
KIT ROOM TURNOVER OR (KITS) ×2 IMPLANT
LINER BOOT UNIVERSAL DISP (MISCELLANEOUS) ×2 IMPLANT
MANIFOLD NEPTUNE II (INSTRUMENTS) ×2 IMPLANT
NS IRRIG 1000ML POUR BTL (IV SOLUTION) ×2 IMPLANT
PACK TOTAL JOINT (CUSTOM PROCEDURE TRAY) ×2 IMPLANT
PACK UNIVERSAL I (CUSTOM PROCEDURE TRAY) ×2 IMPLANT
PAD ARMBOARD 7.5X6 YLW CONV (MISCELLANEOUS) ×4 IMPLANT
RTRCTR WOUND ALEXIS 18CM MED (MISCELLANEOUS) ×2
STAPLER VISISTAT 35W (STAPLE) ×2 IMPLANT
SUT ETHIBOND NAB CT1 #1 30IN (SUTURE) ×6 IMPLANT
SUT VIC AB 0 CT1 27 (SUTURE)
SUT VIC AB 0 CT1 27XBRD ANBCTR (SUTURE) IMPLANT
SUT VIC AB 1 CT1 27 (SUTURE) ×1
SUT VIC AB 1 CT1 27XBRD ANBCTR (SUTURE) ×1 IMPLANT
SUT VIC AB 2-0 CT1 27 (SUTURE) ×1
SUT VIC AB 2-0 CT1 TAPERPNT 27 (SUTURE) ×1 IMPLANT
SUT VLOC 180 0 24IN GS25 (SUTURE) ×2 IMPLANT
TOWEL OR 17X24 6PK STRL BLUE (TOWEL DISPOSABLE) ×2 IMPLANT
TOWEL OR 17X26 10 PK STRL BLUE (TOWEL DISPOSABLE) ×4 IMPLANT
TRAY FOLEY CATH 14FR (SET/KITS/TRAYS/PACK) IMPLANT
WATER STERILE IRR 1000ML POUR (IV SOLUTION) ×2 IMPLANT

## 2014-07-30 NOTE — Anesthesia Preprocedure Evaluation (Signed)
Anesthesia Evaluation  Patient identified by MRN, date of birth, ID band Patient awake    Reviewed: Allergy & Precautions, NPO status , Patient's Chart, lab work & pertinent test results  History of Anesthesia Complications (+) PONV  Airway Mallampati: II   Neck ROM: full    Dental   Pulmonary neg pulmonary ROS,  breath sounds clear to auscultation        Cardiovascular hypertension, Rhythm:regular Rate:Normal     Neuro/Psych    GI/Hepatic GERD-  ,  Endo/Other  obese  Renal/GU      Musculoskeletal  (+) Arthritis -,   Abdominal   Peds  Hematology   Anesthesia Other Findings   Reproductive/Obstetrics                             Anesthesia Physical Anesthesia Plan  ASA: II  Anesthesia Plan: Spinal and MAC   Post-op Pain Management:    Induction: Intravenous  Airway Management Planned: Simple Face Mask  Additional Equipment:   Intra-op Plan:   Post-operative Plan:   Informed Consent: I have reviewed the patients History and Physical, chart, labs and discussed the procedure including the risks, benefits and alternatives for the proposed anesthesia with the patient or authorized representative who has indicated his/her understanding and acceptance.     Plan Discussed with: CRNA, Anesthesiologist and Surgeon  Anesthesia Plan Comments:         Anesthesia Quick Evaluation

## 2014-07-30 NOTE — Anesthesia Postprocedure Evaluation (Signed)
  Anesthesia Post-op Note  Patient: Sheila Gardner  Procedure(s) Performed: Procedure(s): TOTAL HIP ARTHROPLASTY ANTERIOR APPROACH (Right)  Patient Location: PACU  Anesthesia Type:Spinal  Level of Consciousness: awake, alert , oriented and patient cooperative  Airway and Oxygen Therapy: Patient Spontanous Breathing  Post-op Pain: none  Post-op Assessment: Post-op Vital signs reviewed, Patient's Cardiovascular Status Stable, Respiratory Function Stable, Patent Airway, No signs of Nausea or vomiting and Pain level controlled  Post-op Vital Signs: stable  Last Vitals:  Filed Vitals:   07/30/14 1530  BP: 105/48  Pulse: 61  Temp: 36.7 C  Resp: 18    Complications: No apparent anesthesia complications

## 2014-07-30 NOTE — Progress Notes (Signed)
OT Cancellation Note  Patient Details Name: Sheila Gardner MRN: [id number] DOB: Sep 18, 1935   Cancelled Treatment:    Reason Eval/Treat Not Completed: Other (comment) Current D/C plan is SNF. No apparent immediate acute care OT needs, therefore will defer OT to SNF. If OT eval is needed please call Acute Rehab Dept. at [phone] or text page OT at [phone].  Lena, OTR/L  [phone] 07/30/2014 07/30/2014, 10:59 PM

## 2014-07-30 NOTE — Transfer of Care (Signed)
Immediate Anesthesia Transfer of Care Note  Patient: Sheila Gardner  Procedure(s) Performed: Procedure(s): TOTAL HIP ARTHROPLASTY ANTERIOR APPROACH (Right)  Patient Location: PACU  Anesthesia Type:MAC and Spinal  Level of Consciousness: awake, alert , oriented and patient cooperative  Airway & Oxygen Therapy: Patient Spontanous Breathing  Post-op Assessment: Report given to RN, Post -op Vital signs reviewed and unstable, Anesthesiologist notified and blood pressure 17'B systolic. Other VSS. Dr [name] notified.  Post vital signs: Reviewed  Last Vitals:  Filed Vitals:   07/30/14 0851  BP: 133/50  Pulse: 74  Temp: 36.4 C  Resp: 20    Complications: No apparent anesthesia complications

## 2014-07-30 NOTE — Evaluation (Signed)
Physical Therapy Evaluation Patient Details Name: Sheila Gardner MRN: [id number] DOB: January 16, 1936 Today's Date: 07/30/2014   History of Present Illness  79 y.o. female s/p Rt THA.  Clinical Impression  Pt is s/p right THA, presenting with the deficits listed below (see PT Problem List). Tolerated gait training well post op day #0 up to 25 feet with min guard assist. Reviewed gentle ROM exercises. Motivated to improve and has strong family support. Patient has reports she has arranged to stay at [facility] as she does not want family to burden family to provide assistance at home. Pt will benefit from skilled PT to increase their independence and safety with mobility to allow discharge to the venue listed below.      Follow Up Recommendations SNF    Equipment Recommendations  None recommended by PT    Recommendations for Other Services OT consult     Precautions / Restrictions Precautions Precautions: None Restrictions Weight Bearing Restrictions: Yes RLE Weight Bearing: Weight bearing as tolerated      Mobility  Bed Mobility Overal bed mobility: Needs Assistance Bed Mobility: Supine to Sit     Supine to sit: Supervision;HOB elevated     General bed mobility comments: supervision for safety. HOB slightly elevated. Cues for use of bed rail.  Transfers Overall transfer level: Needs assistance Equipment used: Rolling walker (2 wheeled) Transfers: Sit to/from [facility] Sit to Stand: Min assist Stand pivot transfers: Min assist       General transfer comment: Min assist for boost to stand from lowest bed setting with VC for hand placement. Min assist for walker control during pivot to chair. No buckling noted.  Ambulation/Gait Ambulation/Gait assistance: Min guard Ambulation Distance (Feet): 25 Feet Assistive device: Rolling walker (2 wheeled) Gait Pattern/deviations: Step-through pattern;Decreased step length - left;Decreased stance time -  right;Decreased stride length;Trunk flexed;Antalgic Gait velocity: decreased   General Gait Details: Educated on safe DME use with a rolling walker. VC for sequencing of gait with this device. No loss of balance. VC for upright posture with forward gaze. Mildly antalgic pattern. Tolerated well.  Stairs            Wheelchair Mobility    Modified Rankin (Stroke Patients Only)       Balance Overall balance assessment: Needs assistance Sitting-balance support: No upper extremity supported;Feet supported Sitting balance-Leahy Scale: Fair     Standing balance support: No upper extremity supported Standing balance-Leahy Scale: Fair                               Pertinent Vitals/Pain Pain Assessment: 0-10 Pain Score: 6  Pain Location: Rt hip Pain Descriptors / Indicators: Sore Pain Intervention(s): Monitored during session;Repositioned;Limited activity within patient's tolerance;RN gave pain meds during session    Home Living Family/patient expects to be discharged to:: Skilled nursing facility Living Arrangements: Alone Available Help at Discharge: Family;Available PRN/intermittently (Family in area to check-in on) Type of Home: House Home Access: Stairs to enter Entrance Stairs-Rails: None Entrance Stairs-Number of Steps: 2 Home Layout: Two level;Able to live on main level with bedroom/bathroom Home Equipment: Gilford Rile - 2 wheels;Bedside commode;Shower seat - built in      Prior Function Level of Independence: Independent               Hand Dominance   Dominant Hand: Right    Extremity/Trunk Assessment   Upper Extremity Assessment: Defer to OT evaluation  Lower Extremity Assessment: RLE deficits/detail RLE Deficits / Details: decreased strength and ROM as expected post-op.       Communication   Communication: No difficulties  Cognition Arousal/Alertness: Awake/alert Behavior During Therapy: WFL for tasks  assessed/performed Overall Cognitive Status: Within Functional Limits for tasks assessed                      General Comments General comments (skin integrity, edema, etc.): Ice applied Rt hip, instructed 20 min on 20 min off.    Exercises Total Joint Exercises Ankle Circles/Pumps: AROM;Both;10 reps;Supine Quad Sets: Strengthening;Both;Seated;5 reps Long Arc Quad: Strengthening;Right;10 reps;Seated Marching in Standing: Strengthening;Right;10 reps;Seated      Assessment/Plan    PT Assessment Patient needs continued PT services  PT Diagnosis Abnormality of gait;Difficulty walking;Acute pain   PT Problem List Decreased strength;Decreased range of motion;Decreased activity tolerance;Decreased balance;Decreased mobility;Decreased knowledge of use of DME;Pain  PT Treatment Interventions DME instruction;Gait training;Stair training;Functional mobility training;Therapeutic activities;Therapeutic exercise;Balance training;Patient/family education;Modalities;Neuromuscular re-education   PT Goals (Current goals can be found in the Care Plan section) Acute Rehab PT Goals Patient Stated Goal: Go to rehab, then go home PT Goal Formulation: With patient Time For Goal Achievement: 08/06/14 Potential to Achieve Goals: Good    Frequency BID   Barriers to discharge Decreased caregiver support Lives alone however has multiple family members in area to assist    Co-evaluation               End of Session Equipment Utilized During Treatment: Gait belt Activity Tolerance: Patient tolerated treatment well Patient left: in chair;with call bell/phone within reach;with family/visitor present Nurse Communication: Mobility status         Time: 6948-5462 PT Time Calculation (min) (ACUTE ONLY): 25 min   Charges:   PT Evaluation $Initial PT Evaluation Tier I: 1 Procedure PT Treatments $Therapeutic Activity: 8-22 mins   PT G CodesEllouise Newer 07/30/2014, 5:49  PM Camille Bal [place], [place]

## 2014-07-30 NOTE — Anesthesia Procedure Notes (Addendum)
Spinal Patient location during procedure: OR Start time: 07/30/2014 10:47 AM End time: 07/30/2014 10:53 AM Staffing Anesthesiologist: HODIERNE, ADAM Performed by: anesthesiologist  Preanesthetic Checklist Completed: patient identified, site marked, surgical consent, pre-op evaluation, timeout performed, IV checked, risks and benefits discussed and monitors and equipment checked Spinal Block Patient position: sitting Prep: Betadine Patient monitoring: heart rate, cardiac monitor, continuous pulse ox and blood pressure Approach: midline Location: L3-4 Injection technique: single-shot Needle Needle type: Pencan  Needle gauge: 24 G Needle length: 10 cm Assessment Sensory level: T6 Additional Notes Pt tolerated the procedure well.  Procedure Name: MAC Date/Time: 07/30/2014 10:55 AM Performed by: Jenne Campus Pre-anesthesia Checklist: Patient identified, Emergency Drugs available, Suction available, Patient being monitored and Timeout performed Patient Re-evaluated:Patient Re-evaluated prior to inductionOxygen Delivery Method: Simple face mask

## 2014-07-30 NOTE — Interval H&P Note (Signed)
OK for surgery PD 

## 2014-07-30 NOTE — Op Note (Signed)
PRE-OP DIAGNOSIS:  RIGHT HIP DEGENERATIVE JOINT DISEASE POST-OP DIAGNOSIS:  same PROCEDURE: RIGHT TOTAL HIP ARTHROPLASTY ANTERIOR APPROACH ANESTHESIA:  Spinal and MAC SURGEON:  Melrose Nakayama MD ASSISTANT:  Loni Dolly PA-C   INDICATIONS FOR PROCEDURE:  The patient is a 79 y.o. female with a long history of a painful hip.  This has persisted despite multiple conservative measures.  The patient has persisted with pain and dysfunction making rest and activity difficult.  A total hip replacement is offered as surgical treatment.  Informed operative consent was obtained after discussion of possible complications including reaction to anesthesia, infection, neurovascular injury, dislocation, DVT, PE, and death.  The importance of the postoperative rehab program to optimize result was stressed with the patient.  SUMMARY OF FINDINGS AND PROCEDURE:  Under general anesthesia through a anterior approach an the Hana table a right THR was performed.  The patient had severe degenerative change and good bone quality.  We used DePuy components to replace the hip and these were size KA9 Corail femur capped with a +1 74mm ceramic hip ball.  On the acetabular side we used a size 48 Gription shell with a  plus 0 neutral polyethylene liner.  We did not use a hole eliminator.  Loni Dolly PA-C assisted throughout and was invaluable to the completion of the case in that he helped position and retract while I performed the procedure.  He also closed simultaneously to help minimize OR time.  I used fluoroscopy throughout the case to check position of implants and leg lengths and read all of these views myself.  DESCRIPTION OF PROCEDURE:  The patient was taken to the OR suite where general anesthetic was applied.  The patient was then positioned on the Hana table supine.  All bony prominences were appropriately padded.  Prep and drape was then performed in normal sterile fashion.  The patient was given kefzol preoperative  antibiotic and an appropriate time out was performed.  We then took an anterior approach to the right hip.  Dissection was taken through adipose to the tensor fascia lata fascia.  This structure was incised longitudinally and we dissected in the intermuscular interval just medial to this muscle.  Cobra retractors were placed superior and inferior to the femoral neck superficial to the capsule.  A capsular incision was then made and the retractors were placed along the femoral neck.  Xray was brought in to get a good level for the femoral neck cut which was made with an oscillating saw and osteotome.  The femoral head was removed with a corkscrew.  The acetabulum was exposed and some labral tissues were excised. Reaming was taken to the inside wall of the pelvis and sequentially up to 1 mm smaller than the actual component.  A trial of components was done and then the aforementioned acetabular shell was placed in appropriate tilt and anteversion confirmed by fluoroscopy. The liner was placed along with the hole eliminator and attention was turned to the femur.  The leg was brought down and over into adduction and the elevator bar was used to raise the femur up gently in the wound.  The piriformis was released with care taken to preserve the obturator internus attachment and all of the posterior capsule. The femur was reamed and then broached to the appropriate size.  A trial reduction was done and the aforementioned head and neck assembly gave [place] the best stability in extension with external rotation.  Leg lengths were felt to be about equal by  fluoroscopic exam.  The trial components were removed and the wound irrigated.  We then placed the femoral component in appropriate anteversion.  The head was applied to a dry stem neck and the hip again reduced.  It was again stable in the aforementioned position.  The would was irrigated again followed by re-approximation of anterior capsule with ethibond suture. Tensor  fascia was repaired with V-loc suture  followed by subcutaneous closure with #O and #2 undyed vicryl.  Skin was closed with staples followed by a sterile dressing.  EBL and IOF can be obtained from anesthesia records.  DISPOSITION:  The patient was extubated in the OR and taken to PACU in stable condition to be admitted to the Orthopedic Surgery for appropriate post-op care to include perioperative antibiotics and DVT prophylaxis.

## 2014-07-31 ENCOUNTER — Encounter (HOSPITAL_COMMUNITY): Payer: Self-pay | Admitting: Orthopaedic Surgery

## 2014-07-31 LAB — BASIC METABOLIC PANEL
Anion gap: 8 (ref 5–15)
BUN: 6 mg/dL (ref 6–20)
CALCIUM: 8.8 mg/dL — AB (ref 8.9–10.3)
CO2: 27 mmol/L (ref 22–32)
CREATININE: 0.6 mg/dL (ref 0.44–1.00)
Chloride: 95 mmol/L — ABNORMAL LOW (ref 101–111)
GFR calc non Af Amer: >60 mL/min (ref >60)
Glucose, Bld: 147 mg/dL — ABNORMAL HIGH (ref 65–99)
Potassium: 3.7 mmol/L (ref 3.5–5.1)
Sodium: 130 mmol/L — ABNORMAL LOW (ref 135–145)

## 2014-07-31 LAB — CBC
HEMATOCRIT: 28.2 % — AB (ref 36.0–46.0)
Hemoglobin: 9.5 g/dL — ABNORMAL LOW (ref 12.0–15.0)
MCH: 30 pg (ref 26.0–34.0)
MCHC: 33.7 g/dL (ref 30.0–36.0)
MCV: 89 fL (ref 78.0–100.0)
PLATELETS: 333 10*3/uL (ref 150–400)
RBC: 3.17 MIL/uL — ABNORMAL LOW (ref 3.87–5.11)
RDW: 13.7 % (ref 11.5–15.5)
WBC: 12.2 10*3/uL — AB (ref 4.0–10.5)

## 2014-07-31 NOTE — Progress Notes (Signed)
Physical Therapy Treatment Patient Details Name: Sheila Gardner MRN: [id number] DOB: 14-Apr-1935 Today's Date: 07/31/2014    History of Present Illness 79 y.o. female s/p Rt THA.    PT Comments    Progressing towards physical therapy goals. Min guard for all mobility today including transfers and ambulating with a rolling walker up to 115 feet. Remains motivated and has been performing some of the exercises she was taught during previous session. Patient will continue to benefit from skilled physical therapy services to further improve independence with functional mobility.   Follow Up Recommendations  SNF     Equipment Recommendations  None recommended by PT    Recommendations for Other Services OT consult     Precautions / Restrictions Precautions Precautions: None Restrictions Weight Bearing Restrictions: Yes RLE Weight Bearing: Weight bearing as tolerated    Mobility  Bed Mobility               General bed mobility comments: pt sitting EOB at start of therapy  Transfers Overall transfer level: Needs assistance Equipment used: Rolling walker (2 wheeled) Transfers: Sit to/from [facility] Sit to Stand: Min guard Stand pivot transfers: Min guard       General transfer comment: Min guard for safety with sit<>stand and pivot from bed to [facility]. VC for walker placement. No physical assist required VC for hand placement.  Ambulation/Gait Ambulation/Gait assistance: Min guard Ambulation Distance (Feet): 115 Feet Assistive device: Rolling walker (2 wheeled) Gait Pattern/deviations: Step-through pattern;Decreased step length - left;Decreased stance time - right;Antalgic;Trunk flexed Gait velocity: decreased   General Gait Details: VC for walker placement at times for proximity, especially with turns. Min guad throughout for safety with mildly antalgic gait patterns. Cues for Rt hip exension in stance phase due to slight truncal flexion. No loss of  balance during bout while using a RW for assist.   Stairs            Wheelchair Mobility    Modified Rankin (Stroke Patients Only)       Balance                                    Cognition Arousal/Alertness: Awake/alert Behavior During Therapy: WFL for tasks assessed/performed Overall Cognitive Status: Within Functional Limits for tasks assessed                      Exercises Total Joint Exercises Ankle Circles/Pumps: AROM;Both;10 reps;Supine Quad Sets: Strengthening;Both;Seated;10 reps Gluteal Sets: Strengthening;Both;10 reps;Seated Long Arc Quad: Strengthening;Right;10 reps;Seated Marching in Standing: Strengthening;Right;10 reps;Seated    General Comments General comments (skin integrity, edema, etc.): Pt urinated in [facility]      Pertinent Vitals/Pain Pain Assessment: 0-10 Pain Score:  ("I'm doing better this morning" no value given) Pain Location: rt hip Pain Intervention(s): Monitored during session;Repositioned    Home Living                      Prior Function            PT Goals (current goals can now be found in the care plan section) Acute Rehab PT Goals Patient Stated Goal: Go to rehab, then go home PT Goal Formulation: With patient Time For Goal Achievement: 08/06/14 Potential to Achieve Goals: Good Progress towards PT goals: Progressing toward goals    Frequency  BID    PT Plan Current plan remains appropriate  Co-evaluation             End of Session Equipment Utilized During Treatment: Gait belt Activity Tolerance: Patient tolerated treatment well Patient left: in chair;with call bell/phone within reach     Time: 0915-0930 PT Time Calculation (min) (ACUTE ONLY): 15 min  Charges:  $Gait Training: 8-22 mins                    G Codes:      Ellouise Newer 2014/08/02, 9:54 AM Elayne Snare, [place]

## 2014-07-31 NOTE — Progress Notes (Signed)
Physical Therapy Treatment Patient Details Name: Sheila Gardner MRN: [id number] DOB: 11-07-1935 Today's Date: 07/31/2014    History of Present Illness 79 y.o. female s/p Rt THA.    PT Comments    Progressing well towards physical therapy goals. Reviewed and progressed therapeutic exercises. Min assist for bed mobility, min guard to ambulate up to 120 feet, using RW for support. Patient will continue to benefit from skilled physical therapy services to further improve independence with functional mobility.   Follow Up Recommendations  SNF     Equipment Recommendations  None recommended by PT    Recommendations for Other Services OT consult     Precautions / Restrictions Precautions Precautions: None Restrictions Weight Bearing Restrictions: Yes RLE Weight Bearing: Weight bearing as tolerated    Mobility  Bed Mobility Overal bed mobility: Needs Assistance Bed Mobility: Sit to Supine;Supine to Sit     Supine to sit: Supervision Sit to supine: Min assist   General bed mobility comments: Supervision with VC to bring LEs out of bed, HOB elevated and use of rail utilized. Min assist for LE support to return to supine. Educated to use LLE to support RLE as needed.  Transfers Overall transfer level: Needs assistance Equipment used: Rolling walker (2 wheeled) Transfers: Sit to/from Stand Sit to Stand: Min guard         General transfer comment: Min guard for safety from lowest bed setting. Requires extra time and cues for hand placement.  Ambulation/Gait Ambulation/Gait assistance: Min guard Ambulation Distance (Feet): 120 Feet Assistive device: Rolling walker (2 wheeled) Gait Pattern/deviations: Step-through pattern;Decreased stride length;Decreased stance time - right;Trunk flexed Gait velocity: decreased   General Gait Details: Improved control of RW however did require several cues for RW placement during turns. No loss of balance. Continues to be mildly antalgic.  No buckling noted. Emerging ability to continuously push walker with step-though gait pattern.   Stairs            Wheelchair Mobility    Modified Rankin (Stroke Patients Only)       Balance                                    Cognition Arousal/Alertness: Awake/alert Behavior During Therapy: WFL for tasks assessed/performed Overall Cognitive Status: Within Functional Limits for tasks assessed                      Exercises Total Joint Exercises Ankle Circles/Pumps: AROM;Both;10 reps;Supine Heel Slides: AAROM;Strengthening;Right;10 reps;Supine Hip ABduction/ADduction: AAROM;Strengthening;Right;10 reps;Supine Straight Leg Raises: AAROM;Strengthening;Right;Seated (8 reps) Long Arc Quad: Strengthening;Right;10 reps;Seated Marching in Standing: Strengthening;Right;10 reps;Seated    General Comments        Pertinent Vitals/Pain Pain Assessment: No/denies pain Pain Score: 5  Pain Location: right hip Pain Descriptors / Indicators: Sore;Heaviness Pain Intervention(s): Monitored during session    Home Living Family/patient expects to be discharged to:: Skilled nursing facility Living Arrangements: Alone Available Help at Discharge: Family;Available PRN/intermittently Type of Home: House Home Access: Stairs to enter Entrance Stairs-Rails: None Home Layout: Two level;Able to live on main level with bedroom/bathroom Home Equipment: Gilford Rile - 2 wheels;Bedside commode;Shower seat - built in      Prior Function Level of Independence: Independent          PT Goals (current goals can now be found in the care plan section) Acute Rehab PT Goals Patient Stated Goal: Go to rehab, then  go home PT Goal Formulation: With patient Time For Goal Achievement: 08/06/14 Potential to Achieve Goals: Good Progress towards PT goals: Progressing toward goals    Frequency  BID    PT Plan Current plan remains appropriate    Co-evaluation              End of Session Equipment Utilized During Treatment: Gait belt Activity Tolerance: Patient tolerated treatment well Patient left: with call bell/phone within reach;in bed;with family/visitor present;with SCD's reapplied     Time: 1432-1450 PT Time Calculation (min) (ACUTE ONLY): 18 min  Charges:  $Gait Training: 8-22 mins                    G Codes:      Ellouise Newer 27-Aug-2014, 3:16 PM Camille Bal [place], [place]

## 2014-07-31 NOTE — Evaluation (Signed)
Occupational Therapy Evaluation Patient Details Name: Sheila Gardner MRN: [id number] DOB: 1935/07/06 Today's Date: 07/31/2014    History of Present Illness 79 y.o. female s/p Rt THA.   Clinical Impression   Pt s/p above. Recommending SNF for rehab. Will defer all further OT needs to next venue of care.    Follow Up Recommendations  SNF    Equipment Recommendations  Other (comment) (defer to next venue-AE)    Recommendations for Other Services       Precautions / Restrictions Precautions Precautions: Fall Restrictions Weight Bearing Restrictions: Yes RLE Weight Bearing: Weight bearing as tolerated      Mobility Bed Mobility Overal bed mobility: Needs Assistance Bed Mobility: Sit to Supine;Supine to Sit     Supine to sit: Min assist Sit to supine: Min assist   General bed mobility comments: assisted with Rt LE. Explained techniques she could use to assist right leg.  Transfers Overall transfer level: Needs assistance   Transfers: Sit to/from Stand Sit to Stand: Min guard         General transfer comment: cues for hand placement and positioning of right leg to help with pain.    Balance    No LOB in session. Balance not formally assessed.                                        ADL Overall ADL's : Needs assistance/impaired Eating/Feeding: Independent;Sitting   Grooming: Wash/dry hands;Set up;Supervision/safety;Standing           Upper Body Dressing : Set up;Sitting   Lower Body Dressing: Moderate assistance;Sit to/from stand   Toilet Transfer: Min guard;Ambulation;RW;Comfort height toilet;Grab bars   Toileting- Clothing Manipulation and Hygiene: Supervision/safety;Sit to/from stand       Functional mobility during ADLs: Min guard;Rolling walker General ADL Comments: Educated on LB dressing technique and briefly talked about AE/where they could purchase. Educated on safety such as use of bag on walker and safe footwear.  Nurse  gave Tylenol in session.     Vision     Perception     Praxis      Pertinent Vitals/Pain Pain Assessment: 0-10 Pain Score: 5  Pain Location: right hip Pain Descriptors / Indicators: Sore;Heaviness Pain Intervention(s): Repositioned;Monitored during session;Limited activity within patient's tolerance     Hand Dominance Right   Extremity/Trunk Assessment Upper Extremity Assessment Upper Extremity Assessment: Overall WFL for tasks assessed   Lower Extremity Assessment Lower Extremity Assessment: Defer to PT evaluation       Communication Communication Communication: No difficulties   Cognition Arousal/Alertness: Awake/alert Behavior During Therapy: WFL for tasks assessed/performed Overall Cognitive Status: Within Functional Limits for tasks assessed (seemed to have slow processing)                      General Comments       Exercises       Shoulder Instructions      Home Living Family/patient expects to be discharged to:: Skilled nursing facility Living Arrangements: Alone Available Help at Discharge: Family;Available PRN/intermittently Type of Home: House Home Access: Stairs to enter [employer] of Steps: 2 Entrance Stairs-Rails: None Home Layout: Two level;Able to live on main level with bedroom/bathroom               Home Equipment: Gilford Rile - 2 wheels;Bedside commode;Shower seat - built in  Prior Functioning/Environment Level of Independence: Independent             OT Diagnosis: Acute pain   OT Problem List: Decreased range of motion;Decreased activity tolerance;Decreased knowledge of use of DME or AE;Decreased knowledge of precautions;Pain   OT Treatment/Interventions:      OT Goals(Current goals can be found in the care plan section) Acute Rehab OT Goals Patient Stated Goal: go to rehab  OT Frequency:     Barriers to D/C:            Co-evaluation              End of Session Equipment  Utilized During Treatment: Gait belt;Rolling walker  Activity Tolerance: Patient limited by pain Patient left: in bed;with call bell/phone within reach;with family/visitor present   Time: 9373-4287 OT Time Calculation (min): 15 min Charges:  OT General Charges $OT Visit: 1 Procedure OT Evaluation $Initial OT Evaluation Tier I: 1 Procedure G-CodesBenito Mccreedy OTR/L [id number] 07/31/2014, 2:26 PM

## 2014-07-31 NOTE — Progress Notes (Signed)
Subjective: 1 Day Post-Op Procedure(s) (LRB): TOTAL HIP ARTHROPLASTY ANTERIOR APPROACH (Right)  Activity level:  wbat Diet tolerance:  ok Voiding:  ok Patient reports pain as mild and moderate.    Objective: Vital signs in last 24 hours: Temp:  [96.4 F (35.8 C)-99.5 F (37.5 C)] 98.1 F (36.7 C) (05/25 0557) Pulse Rate:  [53-111] 90 (05/25 0557) Resp:  [11-20] 18 (05/25 0557) BP: (83-152)/(31-70) 124/60 mmHg (05/25 0557) SpO2:  [90 %-100 %] 97 % (05/25 0557) Weight:  [83.008 kg (183 lb)] 83.008 kg (183 lb) (05/24 0851)  Labs:  Recent Labs  07/31/14 0455  HGB 9.5*    Recent Labs  07/31/14 0455  WBC 12.2*  RBC 3.17*  HCT 28.2*  PLT 333    Recent Labs  07/31/14 0455  NA 130*  K 3.7  CL 95*  CO2 27  BUN 6  CREATININE 0.60  GLUCOSE 147*  CALCIUM 8.8*   No results for input(s): LABPT, INR in the last 72 hours.  Physical Exam:  Neurologically intact ABD soft Neurovascular intact Sensation intact distally Intact pulses distally Dorsiflexion/Plantar flexion intact Incision: dressing C/D/I and no drainage No cellulitis present Compartment soft  Assessment/Plan:  1 Day Post-Op Procedure(s) (LRB): TOTAL HIP ARTHROPLASTY ANTERIOR APPROACH (Right) Advance diet Up with therapy D/C IV fluids Plan for discharge tomorrow Discharge to [facility] place per patient request. Continue on ASA 325mg  BID x 4 weeks post op. Follow up in office 2 weeks post op.   Sheila Gardner, Larwance Sachs 07/31/2014, 7:44 AM

## 2014-07-31 NOTE — Progress Notes (Signed)
PT/OT recommended SNF. Spoke with patient about d/c plan, she is planning to go to [facility]. Referral made to CSW . Will continue to follow.

## 2014-08-01 LAB — CBC
HCT: 28.5 % — ABNORMAL LOW (ref 36.0–46.0)
Hemoglobin: 9.6 g/dL — ABNORMAL LOW (ref 12.0–15.0)
MCH: 30 pg (ref 26.0–34.0)
MCHC: 33.7 g/dL (ref 30.0–36.0)
MCV: 89.1 fL (ref 78.0–100.0)
Platelets: 343 10*3/uL (ref 150–400)
RBC: 3.2 MIL/uL — AB (ref 3.87–5.11)
RDW: 13.8 % (ref 11.5–15.5)
WBC: 16 10*3/uL — ABNORMAL HIGH (ref 4.0–10.5)

## 2014-08-01 MED ORDER — PROMETHAZINE HCL 12.5 MG PO TABS: 12.5000 mg | ORAL_TABLET | ORAL | Status: DC | PRN

## 2014-08-01 MED ORDER — METHOCARBAMOL 500 MG PO TABS: 500.0000 mg | ORAL_TABLET | Freq: Four times a day (QID) | ORAL | Status: DC | PRN

## 2014-08-01 MED ORDER — OXYCODONE-ACETAMINOPHEN 5-325 MG PO TABS: 1.0000 | ORAL_TABLET | ORAL | Status: DC | PRN

## 2014-08-01 MED ORDER — ASPIRIN 325 MG PO TBEC: 325.0000 mg | DELAYED_RELEASE_TABLET | Freq: Two times a day (BID) | ORAL | Status: DC

## 2014-08-01 NOTE — Discharge Summary (Signed)
Patient ID: Sheila Gardner MRN: [id number] DOB/AGE: Oct 10, 1935 79 y.o.  Admit date: 07/30/2014 Discharge date: 08/01/2014  Admission Diagnoses:  Principal Problem:   Primary osteoarthritis of right hip   Discharge Diagnoses:  Same  Past Medical History  Diagnosis Date  . Hyperlipidemia   . Cancer     Breast- rt  . Arthritis   . Lipoma     back of neck  . Breast cancer   . Hypertension   . PONV (postoperative nausea and vomiting)   . GERD (gastroesophageal reflux disease)     COSTOCHONDRITIS  . Cough with sputum     Surgeries: Procedure(s): TOTAL HIP ARTHROPLASTY ANTERIOR APPROACH on 07/30/2014   Consultants:    Discharged Condition: Improved  Hospital Course: EMORI MUMME is an 79 y.o. female who was admitted 07/30/2014 for operative treatment ofPrimary osteoarthritis of right hip. Patient has severe unremitting pain that affects sleep, daily activities, and work/hobbies. After pre-op clearance the patient was taken to the operating room on 07/30/2014 and underwent  Procedure(s): TOTAL HIP ARTHROPLASTY ANTERIOR APPROACH.    Patient was given perioperative antibiotics: Anti-infectives    Start     Dose/Rate Route Frequency Ordered Stop   07/30/14 1800  ceFAZolin (ANCEF) IVPB 2 g/50 mL premix     2 g 100 mL/hr over 30 Minutes Intravenous Every 6 hours 07/30/14 1559 07/31/14 0037   07/30/14 0642  ceFAZolin (ANCEF) IVPB 2 g/50 mL premix     2 g 100 mL/hr over 30 Minutes Intravenous On call to O.R. 07/30/14 0762 07/30/14 1050       Patient was given sequential compression devices, early ambulation, and chemoprophylaxis to prevent DVT.  Patient benefited maximally from hospital stay and there were no complications.    Recent vital signs: Patient Vitals for the past 24 hrs:  BP Temp Temp src Pulse Resp SpO2  08/01/14 0643 (!) 100/48 mmHg 99.6 F (37.6 C) Oral 87 - 96 %  07/31/14 1925 (!) 117/39 mmHg 98.8 F (37.1 C) Oral 82 18 98 %  07/31/14 1615 - 99.5 F (37.5 C)  - - - -     Recent laboratory studies:  Recent Labs  07/31/14 0455 08/01/14 0500  WBC 12.2* 16.0*  HGB 9.5* 9.6*  HCT 28.2* 28.5*  PLT 333 343  NA 130*  --   K 3.7  --   CL 95*  --   CO2 27  --   BUN 6  --   CREATININE 0.60  --   GLUCOSE 147*  --   CALCIUM 8.8*  --      Discharge Medications:     Medication List    STOP taking these medications        meloxicam 15 MG tablet  Commonly known as:  MOBIC      TAKE these medications        aspirin 325 MG EC tablet  Take 1 tablet (325 mg total) by mouth 2 (two) times daily after a meal.     fish oil-omega-3 fatty acids 1000 MG capsule  Take 1 g by mouth daily.     FLUoxetine 10 MG capsule  Commonly known as:  PROZAC  Take 10 mg by mouth daily.     lisinopril-hydrochlorothiazide 20-12.5 MG per tablet  Commonly known as:  PRINZIDE,ZESTORETIC  Take 1 tablet by mouth daily.     methocarbamol 500 MG tablet  Commonly known as:  ROBAXIN  Take 1 tablet (500 mg total) by mouth every 6 (  six) hours as needed for muscle spasms.     omeprazole 20 MG capsule  Commonly known as:  PRILOSEC  Take 20 mg by mouth daily.     oxyCODONE-acetaminophen 5-325 MG per tablet  Commonly known as:  PERCOCET/ROXICET  Take 1-2 tablets by mouth every 4 (four) hours as needed for moderate pain or severe pain.     promethazine 12.5 MG tablet  Commonly known as:  PHENERGAN  Take 1 tablet (12.5 mg total) by mouth every 4 (four) hours as needed for nausea or vomiting.     traMADol 50 MG tablet  Commonly known as:  ULTRAM  Take 1 tablet (50 mg total) by mouth every 6 (six) hours as needed. pain     VITAMIN B-12 PO  Take 1 tablet by mouth daily.     VITAMIN B6 PO  Take 1 tablet by mouth daily.     vitamin C 500 MG tablet  Commonly known as:  ASCORBIC ACID  Take 500 mg by mouth daily.     Vitamin D-3 1000 UNITS Caps  Take 1 capsule by mouth daily.        Diagnostic Studies: Dg Chest 2 View  07/19/2014   CLINICAL DATA:  Pre  admission film for hip replacement  EXAM: CHEST  2 VIEW  COMPARISON:  02/18/2014  FINDINGS: The heart size and mediastinal contours are within normal limits. Both lungs are clear. The visualized skeletal structures are unremarkable.  IMPRESSION: No active cardiopulmonary disease.   Electronically Signed   By: Inez Catalina M.D.   On: 07/19/2014 14:52   Dg Hip Operative Unilat With Pelvis Right  07/30/2014   CLINICAL DATA:  Intraoperative evaluation right hip.  EXAM: OPERATIVE RIGHT HIP (WITH PELVIS IF PERFORMED) 2 VIEWS  COMPARISON:  None.  FINDINGS: Postoperative changes compatible with right hip arthroplasty. No evidence for associated acute abnormality.  IMPRESSION: Intraoperative evaluation for right hip arthroplasty.   Electronically Signed   By: Lovey Newcomer M.D.   On: 07/30/2014 12:35    Disposition: 01-Home or Self Care      Discharge Instructions    Call MD / Call 911    Complete by:  As directed   If you experience chest pain or shortness of breath, CALL 911 and be transported to the hospital emergency room.  If you develope a fever above 101 F, pus (white drainage) or increased drainage or redness at the wound, or calf pain, call your surgeon's office.     Constipation Prevention    Complete by:  As directed   Drink plenty of fluids.  Prune juice may be helpful.  You may use a stool softener, such as Colace (over the counter) 100 mg twice a day.  Use MiraLax (over the counter) for constipation as needed.     Diet - low sodium heart healthy    Complete by:  As directed      Discharge instructions    Complete by:  As directed   INSTRUCTIONS AFTER JOINT REPLACEMENT   Remove items at home which could result in a fall. This includes throw rugs or furniture in walking pathways ICE to the affected joint every three hours while awake for 30 minutes at a time, for at least the first 3-5 days, and then as needed for pain and swelling.  Continue to use ice for pain and swelling. You may notice  swelling that will progress down to the foot and ankle.  This is normal after surgery.  Elevate your leg when you are not up walking on it.   Continue to use the breathing machine you got in the hospital (incentive spirometer) which will help keep your temperature down.  It is common for your temperature to cycle up and down following surgery, especially at night when you are not up moving around and exerting yourself.  The breathing machine keeps your lungs expanded and your temperature down.   DIET:  As you were doing prior to hospitalization, we recommend a well-balanced diet.  DRESSING / WOUND CARE / SHOWERING  You may shower 3 days after surgery, but keep the wounds dry during showering.  You may use an occlusive plastic wrap (Press'n Seal for example), NO SOAKING/SUBMERGING IN THE BATHTUB.  If the bandage gets wet, change with a clean dry gauze.  If the incision gets wet, pat the wound dry with a clean towel.  ACTIVITY  Increase activity slowly as tolerated, but follow the weight bearing instructions below.   No driving for 6 weeks or until further direction given by your physician.  You cannot drive while taking narcotics.  No lifting or carrying greater than 10 lbs. until further directed by your surgeon. Avoid periods of inactivity such as sitting longer than an hour when not asleep. This helps prevent blood clots.  You may return to work once you are authorized by your doctor.     WEIGHT BEARING   Weight bearing as tolerated with assist device (walker, cane, etc) as directed, use it as long as suggested by your surgeon or therapist, typically at least 4-6 weeks.   EXERCISES  Results after joint replacement surgery are often greatly improved when you follow the exercise, range of motion and muscle strengthening exercises prescribed by your doctor. Safety measures are also important to protect the joint from further injury. Any time any of these exercises cause you to have  increased pain or swelling, decrease what you are doing until you are comfortable again and then slowly increase them. If you have problems or questions, call your caregiver or physical therapist for advice.   Rehabilitation is important following a joint replacement. After just a few days of immobilization, the muscles of the leg can become weakened and shrink (atrophy).  These exercises are designed to build up the tone and strength of the thigh and leg muscles and to improve motion. Often times heat used for twenty to thirty minutes before working out will loosen up your tissues and help with improving the range of motion but do not use heat for the first two weeks following surgery (sometimes heat can increase post-operative swelling).   These exercises can be done on a training (exercise) mat, on the floor, on a table or on a bed. Use whatever works the best and is most comfortable for you.    Use music or television while you are exercising so that the exercises are a pleasant break in your day. This will make your life better with the exercises acting as a break in your routine that you can look forward to.   Perform all exercises about fifteen times, three times per day or as directed.  You should exercise both the operative leg and the other leg as well.   Exercises include:   Quad Sets - Tighten up the muscle on the front of the thigh (Quad) and hold for 5-10 seconds.   Straight Leg Raises - With your knee straight (if you were given a brace, keep it on),  lift the leg to 60 degrees, hold for 3 seconds, and slowly lower the leg.  Perform this exercise against resistance later as your leg gets stronger.  Leg Slides: Lying on your back, slowly slide your foot toward your buttocks, bending your knee up off the floor (only go as far as is comfortable). Then slowly slide your foot back down until your leg is flat on the floor again.  Angel Wings: Lying on your back spread your legs to the side as far  apart as you can without causing discomfort.  Hamstring Strength:  Lying on your back, push your heel against the floor with your leg straight by tightening up the muscles of your buttocks.  Repeat, but this time bend your knee to a comfortable angle, and push your heel against the floor.  You may put a pillow under the heel to make it more comfortable if necessary.   A rehabilitation program following joint replacement surgery can speed recovery and prevent re-injury in the future due to weakened muscles. Contact your doctor or a physical therapist for more information on knee rehabilitation.    CONSTIPATION  Constipation is defined medically as fewer than three stools per week and severe constipation as less than one stool per week.  Even if you have a regular bowel pattern at home, your normal regimen is likely to be disrupted due to multiple reasons following surgery.  Combination of anesthesia, postoperative narcotics, change in appetite and fluid intake all can affect your bowels.   YOU MUST use at least one of the following options; they are listed in order of increasing strength to get the job done.  They are all available over the counter, and you may need to use some, POSSIBLY even all of these options:    Drink plenty of fluids (prune juice may be helpful) and high fiber foods Colace 100 mg by mouth twice a day  Senokot for constipation as directed and as needed Dulcolax (bisacodyl), take with full glass of water  Miralax (polyethylene glycol) once or twice a day as needed.  If you have tried all these things and are unable to have a bowel movement in the first 3-4 days after surgery call either your surgeon or your primary doctor.    If you experience loose stools or diarrhea, hold the medications until you stool forms back up.  If your symptoms do not get better within 1 week or if they get worse, check with your doctor.  If you experience "the worst abdominal pain ever" or develop  nausea or vomiting, please contact the office immediately for further recommendations for treatment.   ITCHING:  If you experience itching with your medications, try taking only a single pain pill, or even half a pain pill at a time.  You can also use Benadryl over the counter for itching or also to help with sleep.   TED HOSE STOCKINGS:  Use stockings on both legs until for at least 2 weeks or as directed by physician office. They may be removed at night for sleeping.  MEDICATIONS:  See your medication summary on the "After Visit Summary" that nursing will review with you.  You may have some home medications which will be placed on hold until you complete the course of blood thinner medication.  It is important for you to complete the blood thinner medication as prescribed.  PRECAUTIONS:  If you experience chest pain or shortness of breath - call 911 immediately for transfer to the  hospital emergency department.   If you develop a fever greater that 101 F, purulent drainage from wound, increased redness or drainage from wound, foul odor from the wound/dressing, or calf pain - CONTACT YOUR SURGEON.                                                   FOLLOW-UP APPOINTMENTS:  If you do not already have a post-op appointment, please call the office for an appointment to be seen by your surgeon.  Guidelines for how soon to be seen are listed in your "After Visit Summary", but are typically between 1-4 weeks after surgery.  OTHER INSTRUCTIONS:   Knee Replacement:  Do not place pillow under knee, focus on keeping the knee straight while resting. CPM instructions: 0-90 degrees, 2 hours in the morning, 2 hours in the afternoon, and 2 hours in the evening. Place foam block, curve side up under heel at all times except when in CPM or when walking.  DO NOT modify, tear, cut, or change the foam block in any way.  MAKE SURE YOU:  Understand these instructions.  Get help right away if you are not doing well or  get worse.    Thank you for letting us be a part of your medical care team.  It is a privilege we respect greatly.  We hope these instructions will help you stay on track for a fast and full recovery!     Increase activity slowly as tolerated    Complete by:  As directed            Follow-up Information    Follow up with Hessie Dibble, MD. Schedule an appointment as soon as possible for a visit in 2 weeks.   Specialty:  Orthopedic Surgery   Contact information:   [address] [place] [address] [phone]        Signed: Rich Fuchs 08/01/2014, 1:39 PM

## 2014-08-01 NOTE — Progress Notes (Signed)
Physical Therapy Treatment Patient Details Name: Sheila Gardner MRN: [id number] DOB: 1935-05-31 Today's Date: 08/01/2014    History of Present Illness 79 y.o. female s/p Rt THA.    PT Comments    Progressing towards physical therapy goals. Demonstrating more antalgic gait pattern today and reports feeling "stiffness" of Rt hip. Min guard for safety up to 120 feet while using a rolling walker for support. Required min assist for balance with stair training, and was not very confident while performing this task. Will continue to progress while patient is admitted. Patient will continue to benefit from skilled physical therapy services to further improve independence with functional mobility.   Follow Up Recommendations  SNF     Equipment Recommendations  None recommended by PT    Recommendations for Other Services OT consult     Precautions / Restrictions Precautions Precautions: None Restrictions Weight Bearing Restrictions: Yes RLE Weight Bearing: Weight bearing as tolerated    Mobility  Bed Mobility Overal bed mobility: Needs Assistance Bed Mobility: Sit to Supine;Supine to Sit     Supine to sit: Supervision     General bed mobility comments: No assist needed. Required extra time. Complains of stiffness in Rt hip.  Transfers Overall transfer level: Needs assistance Equipment used: Rolling walker (2 wheeled) Transfers: Sit to/from Stand Sit to Stand: Supervision         General transfer comment: Supervision for safety from lowest bed setting. No assist needed  Ambulation/Gait Ambulation/Gait assistance: Min guard Ambulation Distance (Feet): 120 Feet Assistive device: Rolling walker (2 wheeled) Gait Pattern/deviations: Step-through pattern;Decreased step length - left;Decreased stance time - right;Decreased stride length;Antalgic     General Gait Details: More of an antalgic gait pattern noted today. VC for Rt glute activation in stance phase for upright  posture and to prevent compensatory lurch. No loss of balance. Gait speed improving. Min guard for safety but no buckling noted.   Stairs Stairs: Yes Stairs assistance: Min assist Stair Management: No rails;Step to pattern;Backwards;With walker Number of Stairs: 2 General stair comments: Min assist for walker block and balance. educated on backwards approach with VC for sequencing. Reaches for rails at times due to decreased balance and poor confidence with this task.  Wheelchair Mobility    Modified Rankin (Stroke Patients Only)       Balance                                    Cognition Arousal/Alertness: Awake/alert Behavior During Therapy: WFL for tasks assessed/performed Overall Cognitive Status: Within Functional Limits for tasks assessed                      Exercises Total Joint Exercises Ankle Circles/Pumps: AROM;Both;10 reps;Supine Long Arc Quad: Strengthening;Seated;Both;5 Theatre manager in Standing: Strengthening;Right;10 reps;Seated;Standing (x10 standing - x5 seated)    General Comments        Pertinent Vitals/Pain Pain Assessment: 0-10 Pain Score:  ("Just stiff today" no value given) Pain Location: Rt hip Pain Descriptors / Indicators:  (stiffness) Pain Intervention(s): Monitored during session;Repositioned    Home Living                      Prior Function            PT Goals (current goals can now be found in the care plan section) Acute Rehab PT Goals Patient Stated Goal: Go to rehab,  then go home PT Goal Formulation: With patient Time For Goal Achievement: 08/06/14 Potential to Achieve Goals: Good Progress towards PT goals: Progressing toward goals    Frequency  BID    PT Plan Current plan remains appropriate    Co-evaluation             End of Session Equipment Utilized During Treatment: Gait belt Activity Tolerance: Patient tolerated treatment well Patient left: with call bell/phone within  reach;in chair     Time: 1324-4010 PT Time Calculation (min) (ACUTE ONLY): 15 min  Charges:  $Gait Training: 8-22 mins                    G Codes:      Ellouise Newer Aug 25, 2014, 1:44 PM Camille [facility] [place], [place]

## 2014-08-01 NOTE — Clinical Social Work Note (Signed)
CSW re-faxed clinicals to [employer] (OT notes).  CSW informed PA-C that patient has bed available today at [facility] if medically appropriate to discharge.  Also informed need of dc summary/order if this were the plan.  SNF aware.  Nonnie Done, LCSW [phone]  Psychiatric & Orthopedics (5N 1-8) Clinical Social Worker

## 2014-08-01 NOTE — Progress Notes (Signed)
Report gave to nurse, Darlina Guys at [facility]. Pt is working with PT , will dress when she finish with this session. Pt and her daughter are ready to discharge after the PT evaluation.

## 2014-08-01 NOTE — Progress Notes (Signed)
[employer] called regarding status of patient and working to [employer]. They are requesting additional information/clinicals in which LCSW will fax specifically OT notes. Suanne Marker is the case manager and LCSW called to verify any other needs/clinicals. Suanne Marker denies, reports she just needs OT notes and prior level of functioning.  LCSW updated Suanne Marker that patient prefers [employer].  Faxed clinicals to [phone] [facility] Medicare.  Lane Hacker, MSW Clinical Social Work: Emergency Room [phone]

## 2014-08-01 NOTE — Clinical Social Work Note (Signed)
Clinical Social Work Assessment  Patient Details  Name: Sheila Gardner MRN: [id number] Date of Birth: 1936-02-13  Date of referral:  08/01/14               Reason for consult:  Facility Placement                Permission sought to share information with:  Case Manager, Customer service manager, Family Supports Permission granted to share information::  Yes, Verbal Permission Granted  Name::     Autumn Messing  (patient daughter)   Agency::  [facility]  Relationship::     Contact Information:     Housing/Transportation Living arrangements for the past 2 months:  [place] of Information:  Patient, Medical Team, Case Manager Patient Interpreter Needed:  None Criminal Activity/Legal Involvement Pertinent to Current Situation/Hospitalization:  No - Comment as needed Significant Relationships:  Adult Children Lives with:  Self Do you feel safe going back to the place where you live?  No (Due to surgery needs short term rehab to increase mobility and safety.) Need for family participation in patient care:  No (Coment)  Care giving concerns:  None at this time addressed.   Social Worker assessment / plan:  LCSW providing coverage for patient for unit CSW.  Patient here for right hip pain and recently had surgery.  She has been working with physical therapy and at this time being recommended to go to SNF. Patient agreeable, has already completed paperwork for [facility] per report and potential dc today (Thursday).  Patient reports pain and progressed over the past 5 years and has been crippling with her independence.  Reports she is aware of SNF and benefits and is ready to being therapy once medically stable for DC. Patient has managed medicare in which authorization was sent to insurance company for review and awaiting authorization.  All clinicals have been faxed. Referral and clinicals have been sent to [facility] for review including FL2, [place], and  clinicals.  Employment status:  Retired Nurse, adult PT Recommendations:  [place] / Referral to community resources:  [place]  Patient/Family's Response to care:  Agreeable to plan for SNF  Patient/Family's Understanding of and Emotional Response to Diagnosis, Current Treatment, and Prognosis:  No barriers with understanding recommendations and patient agreeable with right hip surgery to alleviate pain and increase independent in which she reports has been a struggle.  Aware of disposition being SNF and has been proactive in her treatment by already completing paperwork.  Emotional Assessment Appearance:  Appears stated age Attitude/Demeanor/Rapport:  Other (Cooperative and pleasant) Affect (typically observed):  Accepting, Appropriate, Pleasant Orientation:  Oriented to Self, Oriented to Place, Oriented to  Time, Oriented to Situation Alcohol / Substance use:  Not Applicable Psych involvement (Current and /or in the community):  No (Comment)  Discharge Needs  Concerns to be addressed:  Denies Needs/Concerns at this time Readmission within the last 30 days:  No Current discharge risk:  None Barriers to Discharge:  No Barriers Identified, Continued Medical Work up   Lilly Cove, LCSW 08/01/2014, 8:05 AM

## 2014-08-01 NOTE — Clinical Social Work Placement (Signed)
   CLINICAL SOCIAL WORK PLACEMENT  NOTE  Date:  08/01/2014  Patient Details  Name: Sheila Gardner MRN: [id number] Date of Birth: 1935-08-02  Clinical Social Work is seeking post-discharge placement for this patient at the [place] level of care (*CSW will initial, date and re-position this form in  chart as items are completed):  Yes   Patient/family provided with [facility] Work Department's list of facilities offering this level of care within the geographic area requested by the patient (or if unable, by the patient's family).  Yes   Patient/family informed of their freedom to choose among providers that offer the needed level of care, that participate in Medicare, Medicaid or managed care program needed by the patient, have an available bed and are willing to accept the patient.  Yes   Patient/family informed of [facility]'s ownership interest in [facility] and [facility], as well as of the fact that they are under no obligation to receive care at these facilities.  PASRR submitted to EDS on 08/01/14     PASRR number received on 08/01/14     Existing PASRR number confirmed on       FL2 transmitted to all facilities in geographic area requested by pt/family on 08/01/14     FL2 transmitted to all facilities within larger geographic area on       Patient informed that his/her managed care company has contracts with or will negotiate with certain facilities, including the following:        Yes   Patient/family informed of bed offers received.  Patient chooses bed at [facility]     Physician recommends and patient chooses bed at [facility]    Patient to be transferred to   on  .  Patient to be transferred to facility by       Patient family notified on   of transfer.  Name of family member notified:        PHYSICIAN Please prepare priority discharge summary, including medications, Please prepare prescriptions, Please sign FL2      Additional Comment:    _______________________________________________ Lilly Cove, LCSW 08/01/2014, 8:13 AM

## 2014-08-02 ENCOUNTER — Encounter: Payer: Self-pay | Admitting: Adult Health

## 2014-08-02 ENCOUNTER — Non-Acute Institutional Stay (SKILLED_NURSING_FACILITY): Payer: Medicare Other | Admitting: Adult Health

## 2014-08-02 DIAGNOSIS — M1611 Unilateral primary osteoarthritis, right hip: Secondary | ICD-10-CM

## 2014-08-02 DIAGNOSIS — D62 Acute posthemorrhagic anemia: Secondary | ICD-10-CM | POA: Diagnosis not present

## 2014-08-02 DIAGNOSIS — I10 Essential (primary) hypertension: Secondary | ICD-10-CM

## 2014-08-02 DIAGNOSIS — K5900 Constipation, unspecified: Secondary | ICD-10-CM | POA: Diagnosis not present

## 2014-08-02 DIAGNOSIS — E559 Vitamin D deficiency, unspecified: Secondary | ICD-10-CM

## 2014-08-02 DIAGNOSIS — E871 Hypo-osmolality and hyponatremia: Secondary | ICD-10-CM | POA: Diagnosis not present

## 2014-08-02 DIAGNOSIS — D72829 Elevated white blood cell count, unspecified: Secondary | ICD-10-CM | POA: Diagnosis not present

## 2014-08-02 NOTE — Progress Notes (Signed)
Patient ID: Sheila Gardner, female   DOB: 1935-10-06, 79 y.o.   MRN: [id number]   08/02/2014  Facility:  [facility] Home Location:  [place] Room Number: 703-P LEVEL OF CARE:  SNF (31)   Chief Complaint  Patient presents with  . Hospitalization Follow-up    Osteoarthritis S/P right total hip arthroplasty, hypertension, anemia, leukocytosis, hyponatremia and constipation    HISTORY OF PRESENT ILLNESS:  This is a 79 year old female who has been admitted to [facility] on 08/01/15 from [facility] with osteoarthritis status post right total hip arthroplasty. She has PMH of hyperlipidemia, right breast cancer, arthritis, hypertension and GERD.  She is alert and oriented. She verbalized that her pain is under control. She complains of constipation.  She has been admitted for a short-term rehabilitation.  PAST MEDICAL HISTORY:  Past Medical History  Diagnosis Date  . Hyperlipidemia   . Cancer     Breast- rt  . Arthritis   . Lipoma     back of neck  . Breast cancer   . Hypertension   . PONV (postoperative nausea and vomiting)   . GERD (gastroesophageal reflux disease)     COSTOCHONDRITIS  . Cough with sputum     CURRENT MEDICATIONS: Reviewed per MAR/see medication list  No Known Allergies   REVIEW OF SYSTEMS:  GENERAL: no change in appetite, no fatigue, no weight changes, no fever, chills or weakness RESPIRATORY: no cough, SOB, DOE, wheezing, hemoptysis CARDIAC: no chest pain, edema or palpitations GI: no abdominal pain, diarrhea, constipation, heart burn, nausea or vomiting  PHYSICAL EXAMINATION  GENERAL: no acute distress, normal body habitus SKIN:  Right hip anterior surgical hip incision is covered with aquacel, no redness, dry EYES: conjunctivae normal, sclerae normal, normal eye lids NECK: supple, trachea midline, no neck masses, no thyroid tenderness, no thyromegaly LYMPHATICS: no LAN in the neck, no supraclavicular  LAN RESPIRATORY: breathing is even & unlabored, BS CTAB CARDIAC: RRR, no murmur,no extra heart sounds, no edema GI: abdomen soft, normal BS, no masses, no tenderness, no hepatomegaly, no splenomegaly EXTREMITIES: able to move X 4 extremities PSYCHIATRIC: the patient is alert & oriented to person, affect & behavior appropriate  LABS/RADIOLOGY: Labs reviewed: Basic Metabolic Panel:  Recent Labs  05/08/14 1410 07/19/14 1257 07/31/14 0455  NA 137 132* 130*  K 4.1 4.1 3.7  CL  --  97* 95*  CO2 23 23 27   GLUCOSE 99 135* 147*  BUN 9.1 12 6   CREATININE 0.7 0.72 0.60  CALCIUM 9.6 9.7 8.8*   Liver Function Tests:  Recent Labs  05/08/14 1410  AST 30  ALT 21  ALKPHOS 65  BILITOT 1.15  PROT 7.5  ALBUMIN 4.0   CBC:  Recent Labs  05/08/14 1410 07/19/14 1257 07/31/14 0455 08/01/14 0500  WBC 10.5* 7.3 12.2* 16.0*  NEUTROABS 5.8 3.6  --   --   HGB 12.8 12.2 9.5* 9.6*  HCT 39.4 35.6* 28.2* 28.5*  MCV 87.9 88.1 89.0 89.1  PLT 279 254 333 343     Dg Chest 2 View  07/19/2014   CLINICAL DATA:  Pre admission film for hip replacement  EXAM: CHEST  2 VIEW  COMPARISON:  02/18/2014  FINDINGS: The heart size and mediastinal contours are within normal limits. Both lungs are clear. The visualized skeletal structures are unremarkable.  IMPRESSION: No active cardiopulmonary disease.   Electronically Signed   By: Inez Catalina M.D.   On: 07/19/2014 14:52  Dg Hip Operative Unilat With Pelvis Right  07/30/2014   CLINICAL DATA:  Intraoperative evaluation right hip.  EXAM: OPERATIVE RIGHT HIP (WITH PELVIS IF PERFORMED) 2 VIEWS  COMPARISON:  None.  FINDINGS: Postoperative changes compatible with right hip arthroplasty. No evidence for associated acute abnormality.  IMPRESSION: Intraoperative evaluation for right hip arthroplasty.   Electronically Signed   By: Lovey Newcomer M.D.   On: 07/30/2014 12:35    ASSESSMENT/PLAN:  Osteoarthritis S/P right total hip arthroplasty - for rehabilitation;  follow-up with Dr. Rhona Raider, orthopedic surgeon, in 2 weeks; continue aspirin 325 mg 1 tab by mouth twice a day 4 weeks for DVT prophylaxis; Robaxin 500 mg 1 tab by mouth every 6 hours when necessary for muscle spasm; and Percocet 5/325 mg 1-2 tabs by mouth every 4 hours when necessary and Ultram 50 mg 1 tab by mouth every 6 hours when necessary for pain Hypertension - continue lisinopril-HCTZ 20-12 0.5 mg 1 tab by mouth daily Anemia, acute blood loss - hemoglobin 9.6; will monitor Leukocytosis - wbc 16.0; will monitor Hyponatremia - sodium 130; will monitor Constipation - start senna S2 tabs by mouth twice a day and MiraLAX 17 g +4-6 ounces liquid by mouth twice a day     Goals of care:  Short-term rehabilitation   Labs/test ordered:  CBC, CMP   Spent 50 minutes in patient care.    [facility], NP [employer] [phone]

## 2014-08-06 LAB — HEPATIC FUNCTION PANEL
ALT: 18 U/L (ref 7–35)
AST: 16 U/L (ref 13–35)
Alkaline Phosphatase: 91 U/L (ref 25–125)
Bilirubin, Total: 0.5 mg/dL

## 2014-08-06 LAB — CBC AND DIFFERENTIAL
HCT: 28 % — AB (ref 36–46)
Hemoglobin: 9.3 g/dL — AB (ref 12.0–16.0)
Platelets: 493 10*3/uL — AB (ref 150–399)
WBC: 12.7 10^3/mL

## 2014-08-06 LAB — BASIC METABOLIC PANEL
BUN: 8 mg/dL (ref 4–21)
Creatinine: 0.6 mg/dL (ref 0.5–1.1)
Glucose: 108 mg/dL
POTASSIUM: 5.1 mmol/L (ref 3.4–5.3)
Sodium: 137 mmol/L (ref 137–147)

## 2014-08-09 ENCOUNTER — Non-Acute Institutional Stay (SKILLED_NURSING_FACILITY): Payer: Medicare Other | Admitting: Internal Medicine

## 2014-08-09 DIAGNOSIS — T814XXA Infection following a procedure, initial encounter: Secondary | ICD-10-CM

## 2014-08-09 DIAGNOSIS — I10 Essential (primary) hypertension: Secondary | ICD-10-CM

## 2014-08-09 DIAGNOSIS — R110 Nausea: Secondary | ICD-10-CM

## 2014-08-09 DIAGNOSIS — K5900 Constipation, unspecified: Secondary | ICD-10-CM

## 2014-08-09 DIAGNOSIS — M1611 Unilateral primary osteoarthritis, right hip: Secondary | ICD-10-CM | POA: Diagnosis not present

## 2014-08-09 DIAGNOSIS — D72829 Elevated white blood cell count, unspecified: Secondary | ICD-10-CM | POA: Diagnosis not present

## 2014-08-09 DIAGNOSIS — IMO0001 Reserved for inherently not codable concepts without codable children: Secondary | ICD-10-CM

## 2014-08-09 DIAGNOSIS — D62 Acute posthemorrhagic anemia: Secondary | ICD-10-CM | POA: Diagnosis not present

## 2014-08-09 NOTE — Progress Notes (Signed)
  Opened under CMA name in error

## 2014-08-13 ENCOUNTER — Non-Acute Institutional Stay (SKILLED_NURSING_FACILITY): Payer: Medicare Other | Admitting: Adult Health

## 2014-08-13 ENCOUNTER — Encounter: Payer: Self-pay | Admitting: Adult Health

## 2014-08-13 ENCOUNTER — Encounter: Payer: Self-pay | Admitting: Internal Medicine

## 2014-08-13 DIAGNOSIS — M1611 Unilateral primary osteoarthritis, right hip: Secondary | ICD-10-CM | POA: Diagnosis not present

## 2014-08-13 DIAGNOSIS — K5900 Constipation, unspecified: Secondary | ICD-10-CM | POA: Diagnosis not present

## 2014-08-13 DIAGNOSIS — D72829 Elevated white blood cell count, unspecified: Secondary | ICD-10-CM | POA: Diagnosis not present

## 2014-08-13 DIAGNOSIS — E559 Vitamin D deficiency, unspecified: Secondary | ICD-10-CM

## 2014-08-13 DIAGNOSIS — D62 Acute posthemorrhagic anemia: Secondary | ICD-10-CM | POA: Diagnosis not present

## 2014-08-13 DIAGNOSIS — R112 Nausea with vomiting, unspecified: Secondary | ICD-10-CM

## 2014-08-13 DIAGNOSIS — I10 Essential (primary) hypertension: Secondary | ICD-10-CM

## 2014-08-13 NOTE — Progress Notes (Signed)
Patient ID: Sheila Gardner, female   DOB: 1935-04-22, 79 y.o.   MRN: [id number]   08/13/2014  Facility:  [facility]urs[facility] Home Location:  [place] Room Number: 703-P LEVEL OF CARE:  SNF (31)   Chief Complaint  Patient presents with  . Discharge Note    Osteoarthritis S/P right total hip arthroplasty, hypertension, anemia, leukocytosis  and constipation    HISTORY OF PRESENT ILLNESS:  This is a 79 year old female who is for discharge home with Home health PT. She has been admitted to [facility] on 08/01/15 from [facility] with osteoarthritis status post right total hip arthroplasty. She has PMH of hyperlipidemia, right breast cancer, arthritis, hypertension and GERD.  Patient was admitted to this facility for short-term rehabilitation after the patient's recent hospitalization.  Patient has completed SNF rehabilitation and therapy has cleared the patient for discharge.  PAST MEDICAL HISTORY:  Past Medical History  Diagnosis Date  . Hyperlipidemia   . Cancer     Breast- rt  . Arthritis   . Lipoma     back of neck  . Breast cancer   . Hypertension   . PONV (postoperative nausea and vomiting)   . GERD (gastroesophageal reflux disease)     COSTOCHONDRITIS  . Cough with sputum     CURRENT MEDICATIONS: Reviewed per MAR/see medication list  No Known Allergies   REVIEW OF SYSTEMS:  GENERAL: no change in appetite, no fatigue, no weight changes, no fever, chills or weakness RESPIRATORY: no cough, SOB, DOE, wheezing, hemoptysis CARDIAC: no chest pain, edema or palpitations GI: no abdominal pain, diarrhea, constipation, heart burn, nausea or vomiting  PHYSICAL EXAMINATION  GENERAL: no acute distress, normal body habitus SKIN:  Right hip anterior surgical hip incision is healed, dry NECK: supple, trachea midline, no neck masses, no thyroid tenderness, no thyromegaly LYMPHATICS: no LAN in the neck, no supraclavicular LAN RESPIRATORY: breathing is  even & unlabored, BS CTAB CARDIAC: RRR, no murmur,no extra heart sounds, no edema GI: abdomen soft, normal BS, no masses, no tenderness, no hepatomegaly, no splenomegaly EXTREMITIES: able to move X 4 extremities PSYCHIATRIC: the patient is alert & oriented to person, affect & behavior appropriate  LABS/RADIOLOGY: Labs reviewed: 08/06/14  WBC 12.7 hemoglobin 9.3 hematocrit 28.1 MCV 89.2 sodium 137 potassium 5.1 glucose 108 BUN 8 creatinine 0.55 total bilirubin 0.5 alkaline phosphatase 91  SGOT 16 SGPT 18 total protein 6.1 all Bumex 0.3 calcium 9.2 Basic Metabolic Panel:  Recent Labs  05/08/14 1410 07/19/14 1257 07/31/14 0455 08/06/14 1105  NA 137 132* 130* 137  K 4.1 4.1 3.7 5.1  CL  --  97* 95*  --   CO2 23 23 27   --   GLUCOSE 99 135* 147*  --   BUN 9.1 12 6 8   CREATININE 0.7 0.72 0.60 0.6  CALCIUM 9.6 9.7 8.8*  --    Liver Function Tests:  Recent Labs  05/08/14 1410 08/06/14 1105  AST 30 16  ALT 21 18  ALKPHOS 65 91  BILITOT 1.15  --   PROT 7.5  --   ALBUMIN 4.0  --    CBC:  Recent Labs  05/08/14 1410  07/19/14 1257 07/31/14 0455 08/01/14 0500 08/06/14 1105  WBC 10.5*  < > 7.3 12.2* 16.0* 12.7  NEUTROABS 5.8  --  3.6  --   --   --   HGB 12.8  < > 12.2 9.5* 9.6* 9.3*  HCT 39.4  < >  35.6* 28.2* 28.5* 28*  MCV 87.9  --  88.1 89.0 89.1  --   PLT 279  < > 254 333 343 493*  < > = values in this interval not displayed.   Dg Chest 2 View  07/19/2014   CLINICAL DATA:  Pre admission film for hip replacement  EXAM: CHEST  2 VIEW  COMPARISON:  02/18/2014  FINDINGS: The heart size and mediastinal contours are within normal limits. Both lungs are clear. The visualized skeletal structures are unremarkable.  IMPRESSION: No active cardiopulmonary disease.   Electronically Signed   By: Inez Catalina M.D.   On: 07/19/2014 14:52   Dg Hip Operative Unilat With Pelvis Right  07/30/2014   CLINICAL DATA:  Intraoperative evaluation right hip.  EXAM: OPERATIVE RIGHT HIP (WITH  PELVIS IF PERFORMED) 2 VIEWS  COMPARISON:  None.  FINDINGS: Postoperative changes compatible with right hip arthroplasty. No evidence for associated acute abnormality.  IMPRESSION: Intraoperative evaluation for right hip arthroplasty.   Electronically Signed   By: Lovey Newcomer M.D.   On: 07/30/2014 12:35    ASSESSMENT/PLAN:  Osteoarthritis S/P right total hip arthroplasty - for home health PT; follow-up with Dr. Rhona Raider, orthopedic surgeon, in 2 weeks; continue aspirin 325 mg 1 tab by mouth twice a day 4 weeks for DVT prophylaxis; Robaxin 500 mg 1 tab by mouth every 6 hours when necessary for muscle spasm; and Ultram 50 mg 1 tab by mouth every 6 hours when necessary for pain Hypertension - continue lisinopril-HCTZ 20-12 0.5 mg 1 tab by mouth daily Anemia, acute blood loss - hemoglobin 9.3; stable Leukocytosis - wbc 12.7; trending down Constipation - continue senna S2 tabs by mouth twice a day and MiraLAX 17 g +4-6 ounces liquid by mouth twice a day  Nausea - continue Zofran 4 mg 1 tab PO Q 8 hours PRN Vitamin D deficiency - continue vitamin D3 1000 units by mouth daily    I have filled out patient's discharge paperwork and written prescriptions.  Patient will receive home health PT.  Total discharge time: Less than 30 minutes  Discharge time involved coordination of the discharge process with Education officer, museum, nursing staff and therapy department. Medical justification for home health services verified.     [facility], NP [employer] [phone]

## 2014-08-13 NOTE — Progress Notes (Signed)
Patient ID: Sheila Gardner, female   DOB: 01-Nov-1935, 79 y.o.   MRN: [id number]      [place]  PCP:  Melinda Crutch, MD  Code Status: Full Code  No Known Allergies  Chief Complaint  Patient presents with  . New Admit To SNF    New Admission      HPI:  79 year old patient is here for short term rehabilitation post hospital admission with right hip osteoarthritis. she underwent right total hip arthroplasty. She has PMH of hyperlipidemia, right breast cancer, arthritis, hypertension and GERD. She complaints of nausea and mentions that she has history of percocet causing nausea. The bowel regimen has been helpful, she had a bowel movement this am. Pain is under control with current pain medication.   Review of Systems:  Constitutional: Negative for fever, chills, diaphoresis.  HENT: Negative for headache, congestion, nasal discharge Eyes: Negative for eye pain, blurred vision, double vision and discharge.  Respiratory: Negative for cough, shortness of breath and wheezing.   Cardiovascular: Negative for chest pain, palpitations, leg swelling.  Gastrointestinal: Negative for heartburn, vomiting, abdominal pain Genitourinary: Negative for dysuria Skin: Negative for itching, rash.  Neurological: Negative for dizziness, tingling, focal weakness Psychiatric/Behavioral: Negative for depression.    Past Medical History  Diagnosis Date  . Hyperlipidemia   . Cancer     Breast- rt  . Arthritis   . Lipoma     back of neck  . Breast cancer   . Hypertension   . PONV (postoperative nausea and vomiting)   . GERD (gastroesophageal reflux disease)     COSTOCHONDRITIS  . Cough with sputum    Past Surgical History  Procedure Laterality Date  . Tympanoplasty  30 years ago  . Ovary surgery  40 years ago    wedge  . Eye surgery  2001  . Breast surgery  04/22/2009    Rt lumpectomy  . Ankle fracture surgery      RIGHT   2.5 YRS AGO   . Total hip arthroplasty Right  07/30/2014    Procedure: TOTAL HIP ARTHROPLASTY ANTERIOR APPROACH;  Surgeon: Melrose Nakayama, MD;  Location: [facility];  Service: Orthopedics;  Laterality: Right;   Social History:   reports that she has never smoked. She has never used smokeless tobacco. She reports that she drinks alcohol. She reports that she does not use illicit drugs.  Family History  Problem Relation Age of Onset  . Cancer Brother     colon, pancreatic, brain    Medications: Patient's Medications  New Prescriptions   No medications on file  Previous Medications   ASPIRIN EC 325 MG EC TABLET    Take 1 tablet (325 mg total) by mouth 2 (two) times daily after a meal.   CHOLECALCIFEROL (VITAMIN D-3) 1000 UNITS CAPS    Take 1 capsule by mouth daily.   FISH OIL-OMEGA-3 FATTY ACIDS 1000 MG CAPSULE    Take 1 g by mouth daily.   LISINOPRIL-HYDROCHLOROTHIAZIDE (PRINZIDE,ZESTORETIC) 20-12.5 MG PER TABLET    Take 1 tablet by mouth daily. HOLD if B/P is < 110   METHOCARBAMOL (ROBAXIN) 500 MG TABLET    Take 1 tablet (500 mg total) by mouth every 6 (six) hours as needed for muscle spasms.   OMEPRAZOLE (PRILOSEC) 20 MG CAPSULE    Take 20 mg by mouth daily.    OXYCODONE-ACETAMINOPHEN (PERCOCET/ROXICET) 5-325 MG PER TABLET    Take 1-2 tablets by mouth every 4 (four) hours as  needed for moderate pain or severe pain.   POLYETHYLENE GLYCOL (MIRALAX / GLYCOLAX) PACKET    Take 17 g by mouth 2 (two) times daily.   PROMETHAZINE (PHENERGAN) 12.5 MG TABLET    Take 1 tablet (12.5 mg total) by mouth every 4 (four) hours as needed for nausea or vomiting.   SENNOSIDES-DOCUSATE SODIUM (SENOKOT-S) 8.6-50 MG TABLET    Take 2 tablets by mouth 2 (two) times daily.   TRAMADOL (ULTRAM) 50 MG TABLET    Take 1 tablet (50 mg total) by mouth every 6 (six) hours as needed. pain   VITAMIN C (ASCORBIC ACID) 500 MG TABLET    Take 500 mg by mouth daily.  Modified Medications   No medications on file  Discontinued Medications   No medications on file      Physical Exam: Filed Vitals:   08/13/14 2022  BP: 142/86  Pulse: 78  Temp: 98.3 F (36.8 C)  TempSrc: Oral  Resp: 18  Height: 5\' 4"  (1.626 m)  Weight: 224 lb 6.4 oz (101.787 kg)  SpO2: 98%    General- elderly female, in no acute distress Head- normocephalic, atraumatic Throat- moist mucus membrane  Eyes- PERRLA, EOMI, no pallor, no icterus, no discharge, normal conjunctiva, normal sclera Neck- no cervical lymphadenopathy Cardiovascular- normal s1,s2, no murmurs, palpable dorsalis pedis and radial pulses, trace right leg edema Respiratory- bilateral clear to auscultation, no wheeze, no rhonchi, no crackles, no use of accessory muscles Abdomen- bowel sounds present, soft, non tender Musculoskeletal- able to move all 4 extremities, right leg limited range of motion  Neurological- no focal deficit Skin- warm and dry, right hip surgical incision site with mepilex dressing, tip of dressing curled up and tip of incision is erythematous, tender to touch and with pustules present Psychiatry- alert and oriented to person, place and time, normal mood and affect    Labs reviewed: Basic Metabolic Panel:  Recent Labs  05/08/14 1410 07/19/14 1257 07/31/14 0455 08/06/14 1105  NA 137 132* 130* 137  K 4.1 4.1 3.7 5.1  CL  --  97* 95*  --   CO2 23 23 27   --   GLUCOSE 99 135* 147*  --   BUN 9.1 12 6 8   CREATININE 0.7 0.72 0.60 0.6  CALCIUM 9.6 9.7 8.8*  --    Liver Function Tests:  Recent Labs  05/08/14 1410 08/06/14 1105  AST 30 16  ALT 21 18  ALKPHOS 65 91  BILITOT 1.15  --   PROT 7.5  --   ALBUMIN 4.0  --    No results for input(s): LIPASE, AMYLASE in the last 8760 hours. No results for input(s): AMMONIA in the last 8760 hours. CBC:  Recent Labs  05/08/14 1410  07/19/14 1257 07/31/14 0455 08/01/14 0500 08/06/14 1105  WBC 10.5*  < > 7.3 12.2* 16.0* 12.7  NEUTROABS 5.8  --  3.6  --   --   --   HGB 12.8  < > 12.2 9.5* 9.6* 9.3*  HCT 39.4  < > 35.6* 28.2*  28.5* 28*  MCV 87.9  --  88.1 89.0 89.1  --   PLT 279  < > 254 333 343 493*  < > = values in this interval not displayed.   Dg Chest 2 View  07/19/2014   CLINICAL DATA:  Pre admission film for hip replacement  EXAM: CHEST  2 VIEW  COMPARISON:  02/18/2014  FINDINGS: The heart size and mediastinal contours are within normal limits. Both lungs  are clear. The visualized skeletal structures are unremarkable.  IMPRESSION: No active cardiopulmonary disease.   Electronically Signed   By: Inez Catalina M.D.   On: 07/19/2014 14:52   Dg Hip Operative Unilat With Pelvis Right  07/30/2014   CLINICAL DATA:  Intraoperative evaluation right hip.  EXAM: OPERATIVE RIGHT HIP (WITH PELVIS IF PERFORMED) 2 VIEWS  COMPARISON:  None.  FINDINGS: Postoperative changes compatible with right hip arthroplasty. No evidence for associated acute abnormality.  IMPRESSION: Intraoperative evaluation for right hip arthroplasty.   Electronically Signed   By: Lovey Newcomer M.D.   On: 07/30/2014 12:35    Assessment/Plan  Right hip Osteoarthritis  S/P right total hip arthroplasty. Has follow up with orthopedics. With hx of nausea with percocet, d/c percocet. Start oxyIR 5 mg 1-2 tab q4h prn for pain and reassess. Has follow up with orthopedics. Continue aspirin 325 mg bid for dvt prophylaxis for total of 4 weeks. Continue Robaxin 500 mg q6h prn for muscle spasm. Will have her work with physical therapy and occupational therapy team to help with gait training and muscle strengthening exercises.fall precautions. Skin care. Encourage to be out of bed.   Incision site infection With erythema, tenderness and presence of pustule, start doxycycline 100 mg bid x 5 days with florastor 250 mg bid for 2 weeks. Will need for her to see orthopedics ASAP. Monitor for fever and worsening of infection  Blood loss anemia Post op, monitor h&h  Leukocytosis Wbc 16 at discharge. Surgical incision with mepilex dressing and tip of the incision appears  erythematous and is tender to touch and has pustule. This could be contributing to elevated WBC. Starting antibiotics as above. Monitor wbc and temp curve  Nausea Likely from percocet. Discontinuing this. Change reglan to zofran 4 mg q8h prn for nausea and monitor  Hypertension  Slightly elevated SBP. Monitor bp for now. Continue lisinopril-HCTZ 20-12.5 mg daily  Constipation Improved, continue senna S 2 tab bid with miralax bid for now     Goals of care: short term rehabilitation   Labs/tests ordered: cbc with diff, cmp  Family/ staff Communication: reviewed care plan with patient and nursing supervisor    Blanchie Serve, MD  [place] [phone] (Monday-Friday 8 am - 5 pm) [phone] (afterhours)

## 2014-08-15 DIAGNOSIS — Z471 Aftercare following joint replacement surgery: Secondary | ICD-10-CM | POA: Diagnosis not present

## 2014-08-15 DIAGNOSIS — Z96641 Presence of right artificial hip joint: Secondary | ICD-10-CM | POA: Diagnosis not present

## 2014-08-15 DIAGNOSIS — M6281 Muscle weakness (generalized): Secondary | ICD-10-CM | POA: Diagnosis not present

## 2014-08-15 DIAGNOSIS — R2681 Unsteadiness on feet: Secondary | ICD-10-CM | POA: Diagnosis not present

## 2014-10-04 ENCOUNTER — Other Ambulatory Visit: Payer: Self-pay | Admitting: Family Medicine

## 2014-10-04 DIAGNOSIS — M545 Low back pain, unspecified: Secondary | ICD-10-CM

## 2014-10-07 ENCOUNTER — Ambulatory Visit
Admission: RE | Admit: 2014-10-07 | Discharge: 2014-10-07 | Disposition: A | Discharge status: Home / Self Care | Payer: Medicare Other | Source: Ambulatory Visit | Attending: Family Medicine | Admitting: Family Medicine

## 2014-10-07 DIAGNOSIS — M545 Low back pain, unspecified: Secondary | ICD-10-CM

## 2014-10-07 MED ORDER — IOPAMIDOL (ISOVUE-300) INJECTION 61%
100.0000 mL | Freq: Once | INTRAVENOUS | Status: AC | PRN
  Administered 2014-10-07: 100 mL via INTRAVENOUS

## 2014-12-02 ENCOUNTER — Other Ambulatory Visit: Payer: Self-pay | Admitting: Family Medicine

## 2014-12-02 ENCOUNTER — Other Ambulatory Visit: Payer: Self-pay | Admitting: Oncology

## 2014-12-02 DIAGNOSIS — Z853 Personal history of malignant neoplasm of breast: Secondary | ICD-10-CM

## 2015-03-24 DIAGNOSIS — M1712 Unilateral primary osteoarthritis, left knee: Secondary | ICD-10-CM | POA: Diagnosis not present

## 2015-04-02 ENCOUNTER — Other Ambulatory Visit: Payer: Self-pay | Admitting: Family Medicine

## 2015-04-02 DIAGNOSIS — R935 Abnormal findings on diagnostic imaging of other abdominal regions, including retroperitoneum: Secondary | ICD-10-CM

## 2015-04-07 ENCOUNTER — Ambulatory Visit
Admission: RE | Admit: 2015-04-07 | Discharge: 2015-04-07 | Disposition: A | Discharge status: Home / Self Care | Payer: Medicare Other | Source: Ambulatory Visit | Attending: Family Medicine | Admitting: Family Medicine

## 2015-04-07 DIAGNOSIS — R935 Abnormal findings on diagnostic imaging of other abdominal regions, including retroperitoneum: Secondary | ICD-10-CM

## 2015-04-07 DIAGNOSIS — R918 Other nonspecific abnormal finding of lung field: Secondary | ICD-10-CM | POA: Diagnosis not present

## 2015-04-08 DIAGNOSIS — D72829 Elevated white blood cell count, unspecified: Secondary | ICD-10-CM | POA: Diagnosis not present

## 2015-04-09 DIAGNOSIS — S83222A Peripheral tear of medial meniscus, current injury, left knee, initial encounter: Secondary | ICD-10-CM | POA: Diagnosis not present

## 2015-04-09 DIAGNOSIS — M1712 Unilateral primary osteoarthritis, left knee: Secondary | ICD-10-CM | POA: Diagnosis not present

## 2015-04-10 DIAGNOSIS — D72829 Elevated white blood cell count, unspecified: Secondary | ICD-10-CM | POA: Diagnosis not present

## 2015-04-10 DIAGNOSIS — F411 Generalized anxiety disorder: Secondary | ICD-10-CM | POA: Diagnosis not present

## 2015-04-10 DIAGNOSIS — J329 Chronic sinusitis, unspecified: Secondary | ICD-10-CM | POA: Diagnosis not present

## 2015-05-02 ENCOUNTER — Ambulatory Visit
Admission: RE | Admit: 2015-05-02 | Discharge: 2015-05-02 | Disposition: A | Discharge status: Home / Self Care | Payer: Medicare Other | Source: Ambulatory Visit | Attending: Oncology | Admitting: Oncology

## 2015-05-02 DIAGNOSIS — Z853 Personal history of malignant neoplasm of breast: Secondary | ICD-10-CM

## 2015-05-02 DIAGNOSIS — R928 Other abnormal and inconclusive findings on diagnostic imaging of breast: Secondary | ICD-10-CM | POA: Diagnosis not present

## 2015-05-12 ENCOUNTER — Ambulatory Visit (HOSPITAL_BASED_OUTPATIENT_CLINIC_OR_DEPARTMENT_OTHER): Payer: Medicare Other | Admitting: Oncology

## 2015-05-12 ENCOUNTER — Other Ambulatory Visit: Payer: Self-pay | Admitting: *Deleted

## 2015-05-12 ENCOUNTER — Other Ambulatory Visit (HOSPITAL_BASED_OUTPATIENT_CLINIC_OR_DEPARTMENT_OTHER): Payer: Medicare Other

## 2015-05-12 ENCOUNTER — Telehealth: Payer: Self-pay | Admitting: Oncology

## 2015-05-12 VITALS — BP 157/75 | HR 73 | Temp 97.9°F | Resp 18 | Ht 64.0 in | Wt 186.5 lb

## 2015-05-12 DIAGNOSIS — Z853 Personal history of malignant neoplasm of breast: Secondary | ICD-10-CM | POA: Diagnosis not present

## 2015-05-12 DIAGNOSIS — C50411 Malignant neoplasm of upper-outer quadrant of right female breast: Secondary | ICD-10-CM

## 2015-05-12 DIAGNOSIS — R7989 Other specified abnormal findings of blood chemistry: Secondary | ICD-10-CM | POA: Diagnosis not present

## 2015-05-12 DIAGNOSIS — E559 Vitamin D deficiency, unspecified: Secondary | ICD-10-CM

## 2015-05-12 DIAGNOSIS — J209 Acute bronchitis, unspecified: Secondary | ICD-10-CM | POA: Diagnosis not present

## 2015-05-12 DIAGNOSIS — R945 Abnormal results of liver function studies: Secondary | ICD-10-CM

## 2015-05-12 LAB — CBC WITH DIFFERENTIAL/PLATELET
BASO%: 0.7 % (ref 0.0–2.0)
Basophils Absolute: 0.1 10*3/uL (ref 0.0–0.1)
EOS ABS: 0.2 10*3/uL (ref 0.0–0.5)
EOS%: 2.3 % (ref 0.0–7.0)
HCT: 38.9 % (ref 34.8–46.6)
HEMOGLOBIN: 12.9 g/dL (ref 11.6–15.9)
LYMPH%: 35 % (ref 14.0–49.7)
MCH: 30 pg (ref 25.1–34.0)
MCHC: 33.2 g/dL (ref 31.5–36.0)
MCV: 90.2 fL (ref 79.5–101.0)
MONO#: 1 10*3/uL — AB (ref 0.1–0.9)
MONO%: 10.3 % (ref 0.0–14.0)
NEUT%: 51.7 % (ref 38.4–76.8)
NEUTROS ABS: 5.1 10*3/uL (ref 1.5–6.5)
Platelets: 311 10*3/uL (ref 145–400)
RBC: 4.31 10*6/uL (ref 3.70–5.45)
RDW: 12.9 % (ref 11.2–14.5)
WBC: 9.9 10*3/uL (ref 3.9–10.3)
lymph#: 3.5 10*3/uL — ABNORMAL HIGH (ref 0.9–3.3)

## 2015-05-12 LAB — COMPREHENSIVE METABOLIC PANEL
ALBUMIN: 4.1 g/dL (ref 3.5–5.0)
ALT: 19 U/L (ref 0–55)
AST: 22 U/L (ref 5–34)
Alkaline Phosphatase: 66 U/L (ref 40–150)
Anion Gap: 12 mEq/L — ABNORMAL HIGH (ref 3–11)
BILIRUBIN TOTAL: 0.91 mg/dL (ref 0.20–1.20)
BUN: 8.4 mg/dL (ref 7.0–26.0)
CO2: 25 meq/L (ref 22–29)
CREATININE: 0.7 mg/dL (ref 0.6–1.1)
Calcium: 9.8 mg/dL (ref 8.4–10.4)
Chloride: 103 mEq/L (ref 98–109)
EGFR: 83 mL/min/{1.73_m2} — AB (ref >90)
GLUCOSE: 107 mg/dL (ref 70–140)
Potassium: 3.8 mEq/L (ref 3.5–5.1)
SODIUM: 140 meq/L (ref 136–145)
TOTAL PROTEIN: 8.1 g/dL (ref 6.4–8.3)

## 2015-05-12 NOTE — Progress Notes (Signed)
. ID: Sheila Gardner   DOB: 1935-08-08  MR#: [id number]  CSN#:[id number]  Patient Care Team: Lona Kettle, MD as PCP - General (Family Medicine) Chauncey Cruel, MD as Consulting Physician (Hematology and Oncology) Thea Silversmith, MD as Consulting Physician (Radiation Oncology) Amada Kingfisher, MD as Consulting Physician (Hematology and Oncology)   CHIEF COMPLAINT: hx right breast cancer  CURRENT TREATMENT: observation  BREAST CANCER HISTORY: Sheila Gardner had her routine screening mammography March 11, 2009 at [facility] This showed a possible mass in the right breast.  She was recalled for additional studies of the right breast on January 7th.  This confirmed a small rounded mass with ill-defined margins, deep in the right breast, which was not palpable by Dr. Joneen Caraway.  Ultrasound showed it to be 8 mm macrolobulated and near a normal-appearing lymph node in the inferior axillary region.  The patient was brought back for ultrasound-guided biopsy on January 12th and this showed ([id number]) an invasive ductal carcinoma which appeared low grade.  The tumor was estrogen and progesterone receptor positive, both at 100% and it had a low MIB-1 at 10%.  There was no evidence of Her-2 amplification with a ratio by CISH of 1.16.    With this information, the patient was referred to Dr. Lucia Gaskins and bilateral breast MRIs were obtained January 18th. They showed a solitary area of enhancement in the right breast, measuring up to 1.7 cm.  Otherwise, this was a negative study.  On February 16th, the patient underwent a right lumpectomy and sentinel lymph node biopsy and this showed ([id number]) an invasive ductal carcinoma which measured 8 mm with negative and ample margins and no lymphovascular invasion.  The tumor was Grade 1 and 0 of 3 sentinel lymph nodes were involved. Her subsequent history is as detailed below.  INTERVAL HISTORY: Sheila Gardner returns today for follow-up of her estrogen receptor positive breast  cancer. Interval history is generally unremarkable. She just had her mammography which was fine.  She had a CT scan in August for abdominal problems which actually showed a small lung lesion. Repeat CT scan was obtained in January which showed this to be completely stable. She is concerned about this.  REVIEW OF SYSTEMS: Sheila Gardner feels a little bit tired, but is able to do all her daily activities without difficulty. She has problems with hearing, and some sinus issues. Sometimes food goes the wrong way. She takes Prilosec daily and when she does not, reflux is severe. She has left knee problems. Aside from this a detailed review of systems today was noncontributory  PAST MEDICAL HISTORY: Past Medical History  Diagnosis Date  . Hyperlipidemia   . Cancer     Breast- rt  . Arthritis   . Lipoma     back of neck  . Breast cancer   . Hypertension   . PONV (postoperative nausea and vomiting)   . GERD (gastroesophageal reflux disease)     COSTOCHONDRITIS  . Cough with sputum   Significant for history of diverticulosis, history of hypercholesterolemia, history of occasional to frequent headaches which by her description are not migraines, history of remote removal of a thyroid mass, history of removal of a colon polyp August of 2004 under Dr. Knox Saliva.  History of degenerative disk disease particularly involving the neck.  History of eardrum replacement about 39 years ago.  History of lens implants for cataracts under Marica Otter.  History of partial ovarian resection bilaterally in 1965.  She had repair of a macular hole  in the right eye in December of 2001 under Lorin Glass [facility]riott.    PAST SURGICAL HISTORY: Past Surgical History  Procedure Laterality Date  . Tympanoplasty  30 years ago  . Ovary surgery  40 years ago    wedge  . Eye surgery  05/06/99  . Breast surgery  04/22/2009    Rt lumpectomy  . Ankle fracture surgery      RIGHT   2.5 YRS AGO   . Total hip arthroplasty Right  07/30/2014    Procedure: TOTAL HIP ARTHROPLASTY ANTERIOR APPROACH;  Surgeon: Melrose Nakayama, MD;  Location: [place];  Service: Orthopedics;  Laterality: Right;    FAMILY HISTORY Family History  Problem Relation Age of Onset  . Cancer Brother     colon, pancreatic, brain  The patient's father died at the age of 60 with dementia.  The patient's mother died at the age of 42 with heart disease.  The patient had one sister who died at the age of one year from a pulmonary problem, and a sister who died in an automobile accident at age 101.  A third sister survives at age 75.  The patient had 11 brothers.  One had pancreatic cancer and two had "brain cancer".  There is no breast or ovarian cancer in the immediate family, although there are some cousins with breast cancer, none diagnosed before the age of 63.    GYNECOLOGIC HISTORY: She is GX P2.  First pregnancy to term at age 21.  Went through the change of life at age 18.  She never took hormone replacement.    SOCIAL HISTORY: She and her husband Sheila Gardner, who died in 05/05/14, ran a [employer].  He has also done some construction work and her two sons, Sheila Gardner and Sheila Gardner, are both in the building trade.  They both live in [place].  Vaudie has a daughter, Sheila Gardner, who helps with the [employer] and a daughter, Sheila Gardner, who works in [employer]. The patient has 13 grandchildren.  She attends the [employer].     ADVANCED DIRECTIVES: Not in place  HEALTH MAINTENANCE: Social History  Substance Use Topics  . Smoking status: Never Smoker   . Smokeless tobacco: Never Used  . Alcohol Use: Yes     Comment: OCC     Colonoscopy: UTD  PAP:  Bone density: 08/09/2011, normal  Lipid panel:   No Known Allergies  Current Outpatient Prescriptions  Medication Sig Dispense Refill  . aspirin EC 325 MG EC tablet Take 1 tablet (325 mg total) by mouth 2 (two) times daily after a meal. 60 tablet 0  . Cholecalciferol (VITAMIN D-3) 1000 UNITS CAPS Take  1 capsule by mouth daily.    Marland Kitchen lisinopril-hydrochlorothiazide (PRINZIDE,ZESTORETIC) 20-12.5 MG per tablet Take 1 tablet by mouth daily. HOLD if B/P is < 110    . omeprazole (PRILOSEC) 20 MG capsule Take 20 mg by mouth daily.     . polyethylene glycol (MIRALAX / GLYCOLAX) packet Take 17 g by mouth 2 (two) times daily.    . sennosides-docusate sodium (SENOKOT-S) 8.6-50 MG tablet Take 2 tablets by mouth 2 (two) times daily.    . traMADol (ULTRAM) 50 MG tablet Take 1 tablet (50 mg total) by mouth every 6 (six) hours as needed. pain 10 tablet 0  . vitamin C (ASCORBIC ACID) 500 MG tablet Take 500 mg by mouth daily.     No current facility-administered medications for this visit.    OBJECTIVE: Middle-aged white woman  In no acute distress Filed Vitals:   05/12/15 1252  BP: 157/75  Pulse: 73  Temp: 97.9 F (36.6 C)  Resp: 18     Body mass index is 32 kg/(m^2).    ECOG FS: 1 Filed Weights   05/12/15 1252  Weight: 186 lb 8 oz (84.596 kg)   Sclerae unicteric, pupils round and equal Oropharynx clear and moist-- no thrush or other lesions No cervical or supraclavicular adenopathy Lungs no rales or rhonchi Heart regular rate and rhythm Abd soft, obese, nontender, positive bowel sounds MSK no focal spinal tenderness, no upper extremity lymphedema Neuro: nonfocal, well oriented, appropriate affect Breasts: The right breast is status post lumpectomy. There is no evidence of local recurrence. The right axilla is benign. The left breast is unremarkable.   LAB RESULTS: Lab Results  Component Value Date   WBC 9.9 05/12/2015   NEUTROABS 5.1 05/12/2015   HGB 12.9 05/12/2015   HCT 38.9 05/12/2015   MCV 90.2 05/12/2015   PLT 311 05/12/2015      Chemistry      Component Value Date/Time   NA 137 08/06/2014 1105   NA 130* 07/31/2014 0455   NA 137 05/08/2014 1410   K 5.1 08/06/2014 1105   K 4.1 05/08/2014 1410   CL 95* 07/31/2014 0455   CL 102 05/04/2012 1313   CO2 27 07/31/2014 0455   CO2  23 05/08/2014 1410   BUN 8 08/06/2014 1105   BUN 6 07/31/2014 0455   BUN 9.1 05/08/2014 1410   CREATININE 0.6 08/06/2014 1105   CREATININE 0.60 07/31/2014 0455   CREATININE 0.7 05/08/2014 1410   GLU 108 08/06/2014 1105      Component Value Date/Time   CALCIUM 8.8* 07/31/2014 0455   CALCIUM 9.6 05/08/2014 1410   ALKPHOS 91 08/06/2014 1105   ALKPHOS 65 05/08/2014 1410   AST 16 08/06/2014 1105   AST 30 05/08/2014 1410   ALT 18 08/06/2014 1105   ALT 21 05/08/2014 1410   BILITOT 1.15 05/08/2014 1410   BILITOT 0.6 09/20/2011 0948       Lab Results  Component Value Date   LABCA2 16 05/04/2012     STUDIES: Mm Diag Breast Tomo Bilateral  05/02/2015  CLINICAL DATA:  History of malignant lumpectomy of the right breast. No current complaints. EXAM: DIGITAL DIAGNOSTIC BILATERAL MAMMOGRAM WITH 3D TOMOSYNTHESIS AND CAD COMPARISON:  Previous exam(s). ACR Breast Density Category b: There are scattered areas of fibroglandular density. FINDINGS: There are no discrete masses, no areas of architecture distortion, no suspicious calcifications and no change from the prior mammogram. Mammographic images were processed with CAD. IMPRESSION: No evidence of recurrent or new breast malignancy. RECOMMENDATION: Diagnostic mammography in 1 year per standard post lumpectomy protocol. I have discussed the findings and recommendations with the patient. Results were also provided in writing at the conclusion of the visit. If applicable, a reminder letter will be sent to the patient regarding the next appointment. BI-RADS CATEGORY  1: Negative. Electronically Signed   By: Lajean Manes M.D.   On: 05/02/2015 13:42     ASSESSMENT: 80 y.o. [place] woman   (1)  status post right lumpectomy and sentinel lymph node dissection in February 2011 for a T1b N0, stage IA invasive ductal carcinoma, grade 1, strongly estrogen and progesterone receptor positive, HER2/neu negative, with a low MIB-1.    (2)  Decided against  radiation therapy.    (3)  On anastrozole from April 2011 until May 2012 with arthralgias developing.    (  4)  Started letrozole May 2012, stopped June 2013 again due to arthralgias/myalgias.  (5)  followed with observation alone  (6) incidentally noted pulmonary nodule  (7) transient leukocytosis  PLAN:  Kaiyana is doing well from a breast cancer point of view and is now 6 years out from her definitive surgery with no evidence of disease recurrence. His is very favorable.  She had elevated white cell counts on labs a few months ago, but that has resolved. She has minimal imbalances in her white cell count differential, but this requires no further workup.  We reviewed her scan in detail. She has a 0.5 cm right middle lobe nodule which was unchanged after six-month follow-up. The standard for these unfortunately is to repeat another scan in 12 months to assure stability. I have gone ahead and placed that order.  Otherwise I have encouraged her to start a routine exercise program. I gave her a copy of the [employer] pamphlet so [facility] She will see me again in one year and likely will "graduate" at that visit.  Chauncey Cruel, MD Oncology/Hematology [facility] [phone] (Office)

## 2015-05-12 NOTE — Telephone Encounter (Signed)
appt made and avs printed °

## 2015-05-13 LAB — VITAMIN D 25 HYDROXY (VIT D DEFICIENCY, FRACTURES): Vitamin D, 25-Hydroxy: 26.1 ng/mL — ABNORMAL LOW (ref 30.0–100.0)

## 2015-06-05 DIAGNOSIS — M25562 Pain in left knee: Secondary | ICD-10-CM | POA: Diagnosis not present

## 2015-06-06 DIAGNOSIS — M25562 Pain in left knee: Secondary | ICD-10-CM | POA: Diagnosis not present

## 2015-06-09 DIAGNOSIS — M1712 Unilateral primary osteoarthritis, left knee: Secondary | ICD-10-CM | POA: Diagnosis not present

## 2015-06-09 DIAGNOSIS — M25551 Pain in right hip: Secondary | ICD-10-CM | POA: Diagnosis not present

## 2015-07-02 DIAGNOSIS — J029 Acute pharyngitis, unspecified: Secondary | ICD-10-CM | POA: Diagnosis not present

## 2015-07-02 DIAGNOSIS — H9201 Otalgia, right ear: Secondary | ICD-10-CM | POA: Diagnosis not present

## 2015-07-03 ENCOUNTER — Other Ambulatory Visit: Payer: Self-pay | Admitting: Orthopaedic Surgery

## 2015-07-05 ENCOUNTER — Ambulatory Visit (HOSPITAL_COMMUNITY)
Admission: EM | Admit: 2015-07-05 | Discharge: 2015-07-05 | Disposition: A | Discharge status: Home / Self Care | Payer: Medicare Other | Attending: Emergency Medicine | Admitting: Emergency Medicine

## 2015-07-05 ENCOUNTER — Encounter (HOSPITAL_COMMUNITY): Payer: Self-pay

## 2015-07-05 DIAGNOSIS — T7840XA Allergy, unspecified, initial encounter: Secondary | ICD-10-CM | POA: Diagnosis not present

## 2015-07-05 DIAGNOSIS — L509 Urticaria, unspecified: Secondary | ICD-10-CM | POA: Diagnosis not present

## 2015-07-05 MED ORDER — PREDNISONE 50 MG PO TABS: ORAL_TABLET | ORAL | Status: DC

## 2015-07-05 MED ORDER — METHYLPREDNISOLONE SODIUM SUCC 125 MG IJ SOLR
125.0000 mg | Freq: Once | INTRAMUSCULAR | Status: AC
  Administered 2015-07-05: 125 mg via INTRAMUSCULAR

## 2015-07-05 MED ORDER — METHYLPREDNISOLONE SODIUM SUCC 125 MG IJ SOLR
INTRAMUSCULAR | Status: AC
  Filled 2015-07-05: qty 2

## 2015-07-05 NOTE — ED Provider Notes (Signed)
CSN: [id number]     Arrival date & time 07/05/15  1716 History   First MD Initiated Contact with Patient 07/05/15 1734     Chief Complaint  Patient presents with  . Rash   (Consider location/radiation/quality/duration/timing/severity/associated sxs/prior Treatment) HPI She is a 80 year old woman here with her daughter for evaluation of rash. The rash started about 30 minutes prior to arrival. She states she was shopping at a tag sale and shortly thereafter developed red itchy whelps on her wrists. She was on to develop whelps under her arms and on her shoulders. Her daughter gave her some Benadryl, which has improved the whelps.  She denies any wheezing or difficulty breathing. No mouth itching or sensation of throat closing.  Past Medical History  Diagnosis Date  . Hyperlipidemia   . Cancer ([place])     Breast- rt  . Arthritis   . Lipoma     back of neck  . Breast cancer ([place])   . Hypertension   . PONV (postoperative nausea and vomiting)   . GERD (gastroesophageal reflux disease)     COSTOCHONDRITIS  . Cough with sputum    Past Surgical History  Procedure Laterality Date  . Tympanoplasty  30 years ago  . Ovary surgery  40 years ago    wedge  . Eye surgery  2001  . Breast surgery  04/22/2009    Rt lumpectomy  . Ankle fracture surgery      RIGHT   2.5 YRS AGO   . Total hip arthroplasty Right 07/30/2014    Procedure: TOTAL HIP ARTHROPLASTY ANTERIOR APPROACH;  Surgeon: Melrose Nakayama, MD;  Location: [facility];  Service: Orthopedics;  Laterality: Right;   Family History  Problem Relation Age of Onset  . Cancer Brother     colon, pancreatic, brain   Social History  Substance Use Topics  . Smoking status: Never Smoker   . Smokeless tobacco: Never Used  . Alcohol Use: Yes     Comment: OCC   OB History    No data available     Review of Systems s in history of present illness Allergies  Review of patient's allergies indicates no known allergies.  Home Medications   Prior  to Admission medications   Medication Sig Start Date End Date Taking? Authorizing Provider  Cholecalciferol (VITAMIN D-3) 1000 UNITS CAPS Take 1 capsule by mouth daily.   Yes Historical Provider, MD  losartan (COZAAR) 100 MG tablet Take 100 mg by mouth daily.   Yes Historical Provider, MD  omeprazole (PRILOSEC) 20 MG capsule Take 20 mg by mouth daily.  12/25/10  Yes Historical Provider, MD  traMADol (ULTRAM) 50 MG tablet Take 1 tablet (50 mg total) by mouth every 6 (six) hours as needed. pain 05/08/14  Yes Laurie Panda, NP  aspirin EC 325 MG EC tablet Take 1 tablet (325 mg total) by mouth 2 (two) times daily after a meal. 08/01/14   Loni Dolly, PA-C  lisinopril-hydrochlorothiazide (PRINZIDE,ZESTORETIC) 20-12.5 MG per tablet Take 1 tablet by mouth daily. HOLD if B/P is < 110    Historical Provider, MD  polyethylene glycol (MIRALAX / GLYCOLAX) packet Take 17 g by mouth 2 (two) times daily.    Historical Provider, MD  predniSONE (DELTASONE) 50 MG tablet Take 1 pill daily for 5 days. 07/05/15   Melony Overly, MD  sennosides-docusate sodium (SENOKOT-S) 8.6-50 MG tablet Take 2 tablets by mouth 2 (two) times daily.    Historical Provider, MD  vitamin C (ASCORBIC  ACID) 500 MG tablet Take 500 mg by mouth daily.    Historical Provider, MD   Meds Ordered and Administered this Visit   Medications  methylPREDNISolone sodium succinate (SOLU-MEDROL) 125 mg/2 mL injection 125 mg (not administered)    BP 170/66 mmHg  Pulse 68  Temp(Src) 98.1 F (36.7 C) (Oral)  Resp 14  SpO2 100% No data found.   Physical Exam  Constitutional: She is oriented to person, place, and time. She appears well-developed and well-nourished. No distress.  HENT:  Mouth/Throat: Oropharynx is clear and moist. No oropharyngeal exudate.  Cardiovascular: Normal rate, regular rhythm and normal heart sounds.   No murmur heard. Pulmonary/Chest: Effort normal and breath sounds normal. No respiratory distress. She has no wheezes. She  has no rales.  Neurological: She is alert and oriented to person, place, and time.  Skin:  She has some swelling with well-defined erythematous wheals on bilateral wrists. She has erythematous wheals on her upper arms, pant line, and bra line.    ED Course  Procedures (including critical care time)  Labs Review Labs Reviewed - No data to display  Imaging Review No results found.   MDM   1. Urticaria   2. Allergic reaction, initial encounter    Allergic reaction, likely to something in the tag sale. Solu-Medrol given here. Treat with prednisone and OTC allergy medicine. Okay to use Benadryl as needed. Follow-up as needed.     Melony Overly, MD 07/05/15 [phone]

## 2015-07-05 NOTE — ED Notes (Signed)
Patient presents with a rash breakout under both arms and lower back and under bra strap, patient states it started as a welt and is progressively getting worse, patient has taken benadryl spray, breakout happen today about 3 hours ago. No acute distress

## 2015-07-05 NOTE — Discharge Instructions (Signed)
You're having an allergic reaction to something. We gave you a steroid shot here. Take prednisone for 5 days, starting tomorrow. Also take an allergy medicine such as Zyrtec or Claritin daily for the next week. You can take Benadryl as needed on top of these medicines. You should see improvement in the next 6 hours. Follow-up as needed.

## 2015-07-09 DIAGNOSIS — H7112 Cholesteatoma of tympanum, left ear: Secondary | ICD-10-CM | POA: Diagnosis not present

## 2015-07-09 DIAGNOSIS — H6983 Other specified disorders of Eustachian tube, bilateral: Secondary | ICD-10-CM | POA: Diagnosis not present

## 2015-07-09 DIAGNOSIS — H7202 Central perforation of tympanic membrane, left ear: Secondary | ICD-10-CM | POA: Diagnosis not present

## 2015-07-09 DIAGNOSIS — H9313 Tinnitus, bilateral: Secondary | ICD-10-CM | POA: Diagnosis not present

## 2015-07-09 DIAGNOSIS — H906 Mixed conductive and sensorineural hearing loss, bilateral: Secondary | ICD-10-CM | POA: Diagnosis not present

## 2015-07-10 ENCOUNTER — Other Ambulatory Visit: Payer: Self-pay | Admitting: Otolaryngology

## 2015-07-10 DIAGNOSIS — H7112 Cholesteatoma of tympanum, left ear: Secondary | ICD-10-CM

## 2015-07-11 ENCOUNTER — Ambulatory Visit
Admission: RE | Admit: 2015-07-11 | Discharge: 2015-07-11 | Disposition: A | Discharge status: Home / Self Care | Payer: Medicare Other | Source: Ambulatory Visit | Attending: Family Medicine | Admitting: Family Medicine

## 2015-07-11 ENCOUNTER — Other Ambulatory Visit: Payer: Self-pay | Admitting: Family Medicine

## 2015-07-11 ENCOUNTER — Other Ambulatory Visit: Payer: Medicare Other

## 2015-07-11 DIAGNOSIS — I10 Essential (primary) hypertension: Secondary | ICD-10-CM | POA: Diagnosis not present

## 2015-07-11 DIAGNOSIS — Z01811 Encounter for preprocedural respiratory examination: Secondary | ICD-10-CM

## 2015-07-11 DIAGNOSIS — Z01818 Encounter for other preprocedural examination: Secondary | ICD-10-CM | POA: Diagnosis not present

## 2015-07-11 DIAGNOSIS — H7192 Unspecified cholesteatoma, left ear: Secondary | ICD-10-CM | POA: Diagnosis not present

## 2015-07-14 ENCOUNTER — Ambulatory Visit
Admission: RE | Admit: 2015-07-14 | Discharge: 2015-07-14 | Disposition: A | Discharge status: Home / Self Care | Payer: Medicare Other | Source: Ambulatory Visit | Attending: Otolaryngology | Admitting: Otolaryngology

## 2015-07-14 DIAGNOSIS — H7422 Discontinuity and dislocation of left ear ossicles: Secondary | ICD-10-CM | POA: Diagnosis not present

## 2015-07-14 DIAGNOSIS — H7112 Cholesteatoma of tympanum, left ear: Secondary | ICD-10-CM

## 2015-07-14 MED ORDER — IOPAMIDOL (ISOVUE-300) INJECTION 61%: 75.0000 mL | Freq: Once | INTRAVENOUS | Status: DC | PRN

## 2015-07-23 ENCOUNTER — Other Ambulatory Visit: Payer: Self-pay | Admitting: Gastroenterology

## 2015-07-23 DIAGNOSIS — R1013 Epigastric pain: Secondary | ICD-10-CM

## 2015-07-23 DIAGNOSIS — K8020 Calculus of gallbladder without cholecystitis without obstruction: Secondary | ICD-10-CM | POA: Diagnosis not present

## 2015-07-25 ENCOUNTER — Inpatient Hospital Stay (HOSPITAL_COMMUNITY): Admission: RE | Admit: 2015-07-25 | Payer: Medicare Other | Source: Ambulatory Visit

## 2015-07-31 ENCOUNTER — Ambulatory Visit
Admission: RE | Admit: 2015-07-31 | Discharge: 2015-07-31 | Disposition: A | Discharge status: Home / Self Care | Payer: Medicare Other | Source: Ambulatory Visit | Attending: Gastroenterology | Admitting: Gastroenterology

## 2015-07-31 DIAGNOSIS — K8020 Calculus of gallbladder without cholecystitis without obstruction: Secondary | ICD-10-CM | POA: Diagnosis not present

## 2015-07-31 DIAGNOSIS — R1013 Epigastric pain: Secondary | ICD-10-CM

## 2015-08-05 ENCOUNTER — Encounter (HOSPITAL_COMMUNITY): Admission: RE | Payer: Self-pay | Source: Ambulatory Visit

## 2015-08-05 ENCOUNTER — Inpatient Hospital Stay (HOSPITAL_COMMUNITY): Admission: RE | Admit: 2015-08-05 | Payer: Medicare Other | Source: Ambulatory Visit | Admitting: Orthopaedic Surgery

## 2015-08-05 SURGERY — ARTHROPLASTY, KNEE, TOTAL
Anesthesia: Spinal | Laterality: Left

## 2015-08-13 DIAGNOSIS — H9313 Tinnitus, bilateral: Secondary | ICD-10-CM | POA: Diagnosis not present

## 2015-08-13 DIAGNOSIS — H7202 Central perforation of tympanic membrane, left ear: Secondary | ICD-10-CM | POA: Diagnosis not present

## 2015-08-13 DIAGNOSIS — H6983 Other specified disorders of Eustachian tube, bilateral: Secondary | ICD-10-CM | POA: Diagnosis not present

## 2015-08-13 DIAGNOSIS — H90A31 Mixed conductive and sensorineural hearing loss, unilateral, right ear with restricted hearing on the contralateral side: Secondary | ICD-10-CM | POA: Diagnosis not present

## 2015-08-13 DIAGNOSIS — H90A32 Mixed conductive and sensorineural hearing loss, unilateral, left ear with restricted hearing on the contralateral side: Secondary | ICD-10-CM | POA: Diagnosis not present

## 2015-08-13 DIAGNOSIS — H906 Mixed conductive and sensorineural hearing loss, bilateral: Secondary | ICD-10-CM | POA: Diagnosis not present

## 2015-08-13 DIAGNOSIS — H7112 Cholesteatoma of tympanum, left ear: Secondary | ICD-10-CM | POA: Diagnosis not present

## 2015-08-18 ENCOUNTER — Other Ambulatory Visit: Payer: Self-pay | Admitting: Otolaryngology

## 2015-08-18 DIAGNOSIS — H6983 Other specified disorders of Eustachian tube, bilateral: Secondary | ICD-10-CM | POA: Diagnosis not present

## 2015-08-18 DIAGNOSIS — H7012 Chronic mastoiditis, left ear: Secondary | ICD-10-CM | POA: Diagnosis not present

## 2015-08-18 DIAGNOSIS — H7202 Central perforation of tympanic membrane, left ear: Secondary | ICD-10-CM | POA: Diagnosis not present

## 2015-08-18 DIAGNOSIS — H7112 Cholesteatoma of tympanum, left ear: Secondary | ICD-10-CM | POA: Diagnosis not present

## 2015-08-18 DIAGNOSIS — H9192 Unspecified hearing loss, left ear: Secondary | ICD-10-CM | POA: Diagnosis not present

## 2015-08-18 DIAGNOSIS — H9072 Mixed conductive and sensorineural hearing loss, unilateral, left ear, with unrestricted hearing on the contralateral side: Secondary | ICD-10-CM | POA: Diagnosis not present

## 2015-08-18 DIAGNOSIS — H9313 Tinnitus, bilateral: Secondary | ICD-10-CM | POA: Diagnosis not present

## 2015-08-18 DIAGNOSIS — H73892 Other specified disorders of tympanic membrane, left ear: Secondary | ICD-10-CM | POA: Diagnosis not present

## 2015-08-18 DIAGNOSIS — H906 Mixed conductive and sensorineural hearing loss, bilateral: Secondary | ICD-10-CM | POA: Diagnosis not present

## 2015-08-18 HISTORY — PX: MIDDLE EAR SURGERY: SHX713

## 2015-08-25 DIAGNOSIS — H9313 Tinnitus, bilateral: Secondary | ICD-10-CM | POA: Diagnosis not present

## 2015-08-25 DIAGNOSIS — H6983 Other specified disorders of Eustachian tube, bilateral: Secondary | ICD-10-CM | POA: Diagnosis not present

## 2015-08-25 DIAGNOSIS — H906 Mixed conductive and sensorineural hearing loss, bilateral: Secondary | ICD-10-CM | POA: Diagnosis not present

## 2015-08-28 DIAGNOSIS — H906 Mixed conductive and sensorineural hearing loss, bilateral: Secondary | ICD-10-CM | POA: Diagnosis not present

## 2015-08-28 DIAGNOSIS — H9313 Tinnitus, bilateral: Secondary | ICD-10-CM | POA: Diagnosis not present

## 2015-08-28 DIAGNOSIS — H6983 Other specified disorders of Eustachian tube, bilateral: Secondary | ICD-10-CM | POA: Diagnosis not present

## 2015-09-26 DIAGNOSIS — Z96641 Presence of right artificial hip joint: Secondary | ICD-10-CM | POA: Diagnosis not present

## 2015-09-29 DIAGNOSIS — H9313 Tinnitus, bilateral: Secondary | ICD-10-CM | POA: Diagnosis not present

## 2015-09-29 DIAGNOSIS — H6983 Other specified disorders of Eustachian tube, bilateral: Secondary | ICD-10-CM | POA: Diagnosis not present

## 2015-09-29 DIAGNOSIS — H906 Mixed conductive and sensorineural hearing loss, bilateral: Secondary | ICD-10-CM | POA: Diagnosis not present

## 2015-10-22 ENCOUNTER — Other Ambulatory Visit: Payer: Self-pay | Admitting: Orthopaedic Surgery

## 2015-10-22 DIAGNOSIS — D72829 Elevated white blood cell count, unspecified: Secondary | ICD-10-CM | POA: Diagnosis not present

## 2015-10-22 DIAGNOSIS — I10 Essential (primary) hypertension: Secondary | ICD-10-CM | POA: Diagnosis not present

## 2015-10-22 DIAGNOSIS — E7800 Pure hypercholesterolemia, unspecified: Secondary | ICD-10-CM | POA: Diagnosis not present

## 2015-10-22 DIAGNOSIS — E559 Vitamin D deficiency, unspecified: Secondary | ICD-10-CM | POA: Diagnosis not present

## 2015-10-24 ENCOUNTER — Encounter (HOSPITAL_COMMUNITY): Payer: Self-pay

## 2015-10-24 ENCOUNTER — Encounter (HOSPITAL_COMMUNITY)
Admission: RE | Admit: 2015-10-24 | Discharge: 2015-10-24 | Disposition: A | Discharge status: Home / Self Care | Payer: Medicare Other | Source: Ambulatory Visit | Attending: Orthopaedic Surgery | Admitting: Orthopaedic Surgery

## 2015-10-24 DIAGNOSIS — Z0183 Encounter for blood typing: Secondary | ICD-10-CM | POA: Diagnosis not present

## 2015-10-24 DIAGNOSIS — Z79899 Other long term (current) drug therapy: Secondary | ICD-10-CM | POA: Insufficient documentation

## 2015-10-24 DIAGNOSIS — K219 Gastro-esophageal reflux disease without esophagitis: Secondary | ICD-10-CM | POA: Diagnosis not present

## 2015-10-24 DIAGNOSIS — E785 Hyperlipidemia, unspecified: Secondary | ICD-10-CM | POA: Diagnosis not present

## 2015-10-24 DIAGNOSIS — I10 Essential (primary) hypertension: Secondary | ICD-10-CM | POA: Insufficient documentation

## 2015-10-24 DIAGNOSIS — M1712 Unilateral primary osteoarthritis, left knee: Secondary | ICD-10-CM | POA: Diagnosis not present

## 2015-10-24 DIAGNOSIS — Z96641 Presence of right artificial hip joint: Secondary | ICD-10-CM | POA: Diagnosis not present

## 2015-10-24 DIAGNOSIS — Z853 Personal history of malignant neoplasm of breast: Secondary | ICD-10-CM | POA: Insufficient documentation

## 2015-10-24 DIAGNOSIS — Z01818 Encounter for other preprocedural examination: Secondary | ICD-10-CM | POA: Insufficient documentation

## 2015-10-24 DIAGNOSIS — Z01812 Encounter for preprocedural laboratory examination: Secondary | ICD-10-CM | POA: Diagnosis not present

## 2015-10-24 HISTORY — DX: Cardiac murmur, unspecified: R01.1

## 2015-10-24 LAB — TYPE AND SCREEN
ABO/RH(D): B POS
Antibody Screen: NEGATIVE

## 2015-10-24 LAB — BASIC METABOLIC PANEL
ANION GAP: 8 (ref 5–15)
BUN: 14 mg/dL (ref 6–20)
CALCIUM: 10.1 mg/dL (ref 8.9–10.3)
CO2: 29 mmol/L (ref 22–32)
Chloride: 101 mmol/L (ref 101–111)
Creatinine, Ser: 0.57 mg/dL (ref 0.44–1.00)
GFR calc non Af Amer: >60 mL/min (ref >60)
GLUCOSE: 113 mg/dL — AB (ref 65–99)
Potassium: 3.6 mmol/L (ref 3.5–5.1)
SODIUM: 138 mmol/L (ref 135–145)

## 2015-10-24 LAB — CBC WITH DIFFERENTIAL/PLATELET
BASOS PCT: 0 %
Basophils Absolute: 0 10*3/uL (ref 0.0–0.1)
Eosinophils Absolute: 0.1 10*3/uL (ref 0.0–0.7)
Eosinophils Relative: 2 %
HEMATOCRIT: 38.8 % (ref 36.0–46.0)
Hemoglobin: 12.6 g/dL (ref 12.0–15.0)
LYMPHS ABS: 3.3 10*3/uL (ref 0.7–4.0)
Lymphocytes Relative: 43 %
MCH: 29.3 pg (ref 26.0–34.0)
MCHC: 32.5 g/dL (ref 30.0–36.0)
MCV: 90.2 fL (ref 78.0–100.0)
MONO ABS: 0.6 10*3/uL (ref 0.1–1.0)
MONOS PCT: 7 %
NEUTROS ABS: 3.7 10*3/uL (ref 1.7–7.7)
Neutrophils Relative %: 48 %
Platelets: 287 10*3/uL (ref 150–400)
RBC: 4.3 MIL/uL (ref 3.87–5.11)
RDW: 13.2 % (ref 11.5–15.5)
WBC: 7.7 10*3/uL (ref 4.0–10.5)

## 2015-10-24 LAB — URINALYSIS, ROUTINE W REFLEX MICROSCOPIC
Bilirubin Urine: NEGATIVE
Glucose, UA: NEGATIVE mg/dL
Hgb urine dipstick: NEGATIVE
KETONES UR: NEGATIVE mg/dL
LEUKOCYTES UA: NEGATIVE
NITRITE: NEGATIVE
PROTEIN: NEGATIVE mg/dL
Specific Gravity, Urine: 1.019 (ref 1.005–1.030)
pH: 6 (ref 5.0–8.0)

## 2015-10-24 LAB — PROTIME-INR
INR: 1.02
Prothrombin Time: 13.4 seconds (ref 11.4–15.2)

## 2015-10-24 LAB — SURGICAL PCR SCREEN
MRSA, PCR: NEGATIVE
Staphylococcus aureus: NEGATIVE

## 2015-10-24 LAB — APTT: APTT: 31 s (ref 24–36)

## 2015-10-24 NOTE — Pre-Procedure Instructions (Addendum)
Sheila Gardner  10/24/2015      [id number] - [address], [address] [place] [address] Phone: [phone] Fax: [phone]  [id number] - [place], [place] [id number] [place] [id number] Phone: [phone] Fax: [phone]    Your procedure is scheduled on 11/04/15.  Report to [facility] [address] Admitting at 815 A.M.  Call this number if you have problems the morning of surgery:  [phone]   Remember:  Do not eat food or drink liquids after midnight.  Take these medicines the morning of surgery with A SIP OF WATER omeprazole, tramadol if needed  STOP all herbel meds, nsaids (aleve,naproxen,advil,ibuprofen) 5 days prior to surgery starting 10/30/15 including all vitamins(D,B-12 cyancobalamin,C,glucosamine-chondroitin,aspirin   Do not wear jewelry, make-up or nail polish.  Do not wear lotions, powders, or perfumes.  You may wear deoderant.  Do not shave 48 hours prior to surgery.  Men may shave face and neck.  Do not bring valuables to the hospital.  [facility] is not responsible for any belongings or valuables.  Contacts, dentures or bridgework may not be worn into surgery.  Leave your suitcase in the car.  After surgery it may be brought to your room.  For patients admitted to the hospital, discharge time will be determined by your treatment team.  Patients discharged the day of surgery will not be allowed to drive home.   Name and phone number of your driver:    Special instructions:   Special Instructions: [place] - Preparing for Surgery  Before surgery, you can play an important role.  Because skin is not sterile, your skin needs to be as free of germs as possible.  You can reduce the number of germs on you skin by washing with CHG (chlorahexidine gluconate) soap before surgery.  CHG is  an antiseptic cleaner which kills germs and bonds with the skin to continue killing germs even after washing.  Please DO NOT use if you have an allergy to CHG or antibacterial soaps.  If your skin becomes reddened/irritated stop using the CHG and inform your nurse when you arrive at Short Stay.  Do not shave (including legs and underarms) for at least 48 hours prior to the first CHG shower.  You may shave your face.  Please follow these instructions carefully:   1.  Shower with CHG Soap the night before surgery and the morning of Surgery.  2.  If you choose to wash your hair, wash your hair first as usual with your normal shampoo.  3.  After you shampoo, rinse your hair and body thoroughly to remove the Shampoo.  4.  Use CHG as you would any other liquid soap.  You can apply chg directly  to the skin and wash gently with scrungie or a clean washcloth.  5.  Apply the CHG Soap to your body ONLY FROM THE NECK DOWN.  Do not use on open wounds or open sores.  Avoid contact with your eyes ears, mouth and genitals (private parts).  Wash genitals (private parts)       with your normal soap.  6.  Wash thoroughly, paying special attention to the area where your surgery will be performed.  7.  Thoroughly rinse your body with warm water from the neck down.  8.  DO NOT shower/wash  with your normal soap after using and rinsing off the CHG Soap.  9.  Pat yourself dry with a clean towel.            10.  Wear clean pajamas.            11.  Place clean sheets on your bed the night of your first shower and do not sleep with pets.  Day of Surgery  Do not apply any lotions/deodorants the morning of surgery.  Please wear clean clothes to the hospital/surgery center.  Please read over the fact sheets that you were given.

## 2015-10-27 DIAGNOSIS — E559 Vitamin D deficiency, unspecified: Secondary | ICD-10-CM | POA: Diagnosis not present

## 2015-10-27 DIAGNOSIS — Z23 Encounter for immunization: Secondary | ICD-10-CM | POA: Diagnosis not present

## 2015-10-27 DIAGNOSIS — I10 Essential (primary) hypertension: Secondary | ICD-10-CM | POA: Diagnosis not present

## 2015-10-27 DIAGNOSIS — R7301 Impaired fasting glucose: Secondary | ICD-10-CM | POA: Diagnosis not present

## 2015-10-27 DIAGNOSIS — K219 Gastro-esophageal reflux disease without esophagitis: Secondary | ICD-10-CM | POA: Diagnosis not present

## 2015-10-27 DIAGNOSIS — F411 Generalized anxiety disorder: Secondary | ICD-10-CM | POA: Diagnosis not present

## 2015-10-27 DIAGNOSIS — M179 Osteoarthritis of knee, unspecified: Secondary | ICD-10-CM | POA: Diagnosis not present

## 2015-10-27 DIAGNOSIS — Z0000 Encounter for general adult medical examination without abnormal findings: Secondary | ICD-10-CM | POA: Diagnosis not present

## 2015-10-27 DIAGNOSIS — E876 Hypokalemia: Secondary | ICD-10-CM | POA: Diagnosis not present

## 2015-10-27 NOTE — Progress Notes (Addendum)
Anesthesia Chart Review:  Pt is a 80 year old female scheduled for L total knee arthroplasty on 11/04/2015 with Melrose Nakayama, MD.   PCP is C. Melinda Crutch, MD.   PMH includes:  HTN, heart murmur, hyperlipidemia, breast cancer, post-op N/V, GERD. Never smoker. BMI 31.5. S/p R THA 07/30/14.   Medications include: potassium, losartan, prilosec  Preoperative labs reviewed.    Chest x-ray 07/11/15: Mild chronic bronchitic changes. There is no active cardiopulmonary disease.  EKG 07/11/15 (Dr. Harrington Challenger' office): sinus bradycardia (54 bpm). Poor R wave progression.   If no changes, I anticipate pt can proceed with surgery as scheduled.   Willeen Cass, FNP-BC [facility] Short Stay Surgical Center/Anesthesiology Phone: [phone] 10/28/2015 1:04 PM

## 2015-10-31 DIAGNOSIS — C50911 Malignant neoplasm of unspecified site of right female breast: Secondary | ICD-10-CM | POA: Diagnosis not present

## 2015-11-03 MED ORDER — LACTATED RINGERS IV SOLN: INTRAVENOUS | Status: DC

## 2015-11-03 MED ORDER — CEFAZOLIN SODIUM-DEXTROSE 2-4 GM/100ML-% IV SOLN
2.0000 g | INTRAVENOUS | Status: AC
  Administered 2015-11-04: 2 g via INTRAVENOUS
  Filled 2015-11-03: qty 100

## 2015-11-03 NOTE — H&P (Signed)
TOTAL KNEE ADMISSION H&P  Patient is being admitted for left total knee arthroplasty.  Subjective:  Chief Complaint:left knee pain.  HPI: Sheila Gardner, 80 y.o. female, has a history of pain and functional disability in the left knee due to arthritis and has failed non-surgical conservative treatments for greater than 12 weeks to includeNSAID's and/or analgesics, corticosteriod injections, viscosupplementation injections, flexibility and strengthening excercises, use of assistive devices, weight reduction as appropriate and activity modification.  Onset of symptoms was gradual, starting 5 years ago with gradually worsening course since that time. The patient noted no past surgery on the left knee(s).  Patient currently rates pain in the left knee(s) at 10 out of 10 with activity. Patient has night pain, worsening of pain with activity and weight bearing, pain that interferes with activities of daily living, crepitus and joint swelling.  Patient has evidence of subchondral cysts, subchondral sclerosis, periarticular osteophytes and joint space narrowing by imaging studies. There is no active infection.  Patient Active Problem List   Diagnosis Date Noted  . Primary osteoarthritis of right hip 07/30/2014  . Hip pain 05/08/2014  . Elevated liver function tests 05/09/2013  . Vitamin D deficiency 03/25/2011  . Breast cancer of upper-outer quadrant of right female breast ([place]) 12/30/2010   Past Medical History:  Diagnosis Date  . Arthritis   . Breast cancer ([place])   . Cancer ([place])    Breast- rt  . Cough with sputum 2016  . GERD (gastroesophageal reflux disease)    COSTOCHONDRITIS  . Heart murmur   . Hyperlipidemia   . Hypertension   . Lipoma    back of neck  . PONV (postoperative nausea and vomiting)    only after rt hip surgery    Past Surgical History:  Procedure Laterality Date  . ANKLE FRACTURE SURGERY Right 2013   RIGHT   . BREAST SURGERY  04/22/2009   Rt lumpectomy  . EYE  SURGERY  2001   macular hole  . JOINT REPLACEMENT  2016  . MIDDLE EAR SURGERY Left 08/18/2015   repair eardrum and reconstruction  . OVARY SURGERY  40 years ago   wedge  . TOTAL HIP ARTHROPLASTY Right 07/30/2014   Procedure: TOTAL HIP ARTHROPLASTY ANTERIOR APPROACH;  Surgeon: Melrose Nakayama, MD;  Location: [facility];  Service: Orthopedics;  Laterality: Right;  . TYMPANOPLASTY  30 years ago    No prescriptions prior to admission.   Allergies  Allergen Reactions  . No Known Allergies     Social History  Substance Use Topics  . Smoking status: Never Smoker  . Smokeless tobacco: Never Used  . Alcohol use Yes     Comment: OCC -none now    Family History  Problem Relation Age of Onset  . Cancer Brother     colon, pancreatic, brain     Review of Systems  Musculoskeletal: Positive for joint pain.       Left knee  All other systems reviewed and are negative.   Objective:  Physical Exam  Constitutional: She is oriented to person, place, and time. She appears well-developed and well-nourished.  HENT:  Head: Normocephalic and atraumatic.  Eyes: Pupils are equal, round, and reactive to light.  Neck: Normal range of motion.  Cardiovascular: Normal rate and regular rhythm.   Respiratory: Effort normal.  GI: Soft.  Musculoskeletal:  Left knee motion is good from 0-120. She has pain on the medial joint line and some crepitation. Do not feel an effusion. Ligaments are stable.  Hip motion is full and pain free and SLR is negative on both sides.  There is no palpable LAD behind either knee. Leg lengths are equal. Sensation and motor function are intact on both sides and there are palpable pulses on both sides.    Neurological: She is alert and oriented to person, place, and time.  Skin: Skin is warm and dry.  Psychiatric: She has a normal mood and affect. Her behavior is normal. Judgment and thought content normal.    Vital signs in last 24 hours:    Labs:   Estimated body mass  index is 31.45 kg/m as calculated from the following:   Height as of 10/24/15: 5\' 4"  (1.626 m).   Weight as of 10/24/15: 83.1 kg (183 lb 3.2 oz).   Imaging Review Plain radiographs demonstrate severe degenerative joint disease of the left knee(s). The overall alignment isneutral. The bone quality appears to be good for age and reported activity level.  Assessment/Plan:  End stage primary arthritis, left knee   The patient history, physical examination, clinical judgment of the provider and imaging studies are consistent with end stage degenerative joint disease of the left knee(s) and total knee arthroplasty is deemed medically necessary. The treatment options including medical management, injection therapy arthroscopy and arthroplasty were discussed at length. The risks and benefits of total knee arthroplasty were presented and reviewed. The risks due to aseptic loosening, infection, stiffness, patella tracking problems, thromboembolic complications and other imponderables were discussed. The patient acknowledged the explanation, agreed to proceed with the plan and consent was signed. Patient is being admitted for inpatient treatment for surgery, pain control, PT, OT, prophylactic antibiotics, VTE prophylaxis, progressive ambulation and ADL's and discharge planning. The patient is planning to be discharged to skilled nursing facility

## 2015-11-04 ENCOUNTER — Encounter (HOSPITAL_COMMUNITY): Payer: Self-pay | Admitting: Certified Registered Nurse Anesthetist

## 2015-11-04 ENCOUNTER — Inpatient Hospital Stay (HOSPITAL_COMMUNITY)
Admission: RE | Admit: 2015-11-04 | Discharge: 2015-11-06 | DRG: 470 | Disposition: A | Discharge status: Skilled Nursing Facility | Payer: Medicare Other | Source: Ambulatory Visit | Attending: Orthopaedic Surgery | Admitting: Orthopaedic Surgery

## 2015-11-04 ENCOUNTER — Encounter (HOSPITAL_COMMUNITY)
Admission: RE | Disposition: A | Discharge status: Skilled Nursing Facility | Payer: Self-pay | Source: Ambulatory Visit | Attending: Orthopaedic Surgery

## 2015-11-04 ENCOUNTER — Inpatient Hospital Stay (HOSPITAL_COMMUNITY): Payer: Medicare Other | Admitting: Emergency Medicine

## 2015-11-04 DIAGNOSIS — I10 Essential (primary) hypertension: Secondary | ICD-10-CM | POA: Diagnosis present

## 2015-11-04 DIAGNOSIS — M25569 Pain in unspecified knee: Secondary | ICD-10-CM | POA: Diagnosis not present

## 2015-11-04 DIAGNOSIS — M179 Osteoarthritis of knee, unspecified: Secondary | ICD-10-CM | POA: Diagnosis not present

## 2015-11-04 DIAGNOSIS — Z96641 Presence of right artificial hip joint: Secondary | ICD-10-CM | POA: Diagnosis present

## 2015-11-04 DIAGNOSIS — M1712 Unilateral primary osteoarthritis, left knee: Secondary | ICD-10-CM | POA: Diagnosis present

## 2015-11-04 DIAGNOSIS — Z853 Personal history of malignant neoplasm of breast: Secondary | ICD-10-CM

## 2015-11-04 DIAGNOSIS — R110 Nausea: Secondary | ICD-10-CM | POA: Diagnosis not present

## 2015-11-04 DIAGNOSIS — E785 Hyperlipidemia, unspecified: Secondary | ICD-10-CM | POA: Diagnosis present

## 2015-11-04 DIAGNOSIS — G894 Chronic pain syndrome: Secondary | ICD-10-CM | POA: Diagnosis not present

## 2015-11-04 DIAGNOSIS — K219 Gastro-esophageal reflux disease without esophagitis: Secondary | ICD-10-CM | POA: Diagnosis present

## 2015-11-04 DIAGNOSIS — Z96651 Presence of right artificial knee joint: Secondary | ICD-10-CM | POA: Diagnosis present

## 2015-11-04 DIAGNOSIS — Z471 Aftercare following joint replacement surgery: Secondary | ICD-10-CM | POA: Diagnosis present

## 2015-11-04 HISTORY — DX: Unilateral primary osteoarthritis, left knee: M17.12

## 2015-11-04 HISTORY — PX: TOTAL KNEE ARTHROPLASTY: SHX125

## 2015-11-04 SURGERY — ARTHROPLASTY, KNEE, TOTAL
Anesthesia: Spinal | Site: Knee | Laterality: Left

## 2015-11-04 MED ORDER — ASPIRIN EC 325 MG PO TBEC
325.0000 mg | DELAYED_RELEASE_TABLET | Freq: Two times a day (BID) | ORAL | Status: DC
  Administered 2015-11-05 – 2015-11-06 (×3): 325 mg via ORAL
  Filled 2015-11-04 (×3): qty 1

## 2015-11-04 MED ORDER — FENTANYL CITRATE (PF) 100 MCG/2ML IJ SOLN
INTRAMUSCULAR | Status: AC
  Filled 2015-11-04: qty 2

## 2015-11-04 MED ORDER — CEFAZOLIN SODIUM-DEXTROSE 2-4 GM/100ML-% IV SOLN
2.0000 g | Freq: Four times a day (QID) | INTRAVENOUS | Status: AC
  Administered 2015-11-04 (×2): 2 g via INTRAVENOUS
  Filled 2015-11-04 (×2): qty 100

## 2015-11-04 MED ORDER — SUCCINYLCHOLINE CHLORIDE 200 MG/10ML IV SOSY
PREFILLED_SYRINGE | INTRAVENOUS | Status: AC
  Filled 2015-11-04: qty 10

## 2015-11-04 MED ORDER — HYDROMORPHONE HCL 1 MG/ML IJ SOLN
0.2500 mg | INTRAMUSCULAR | Status: DC | PRN
  Administered 2015-11-04 (×2): 0.5 mg via INTRAVENOUS

## 2015-11-04 MED ORDER — METHOCARBAMOL 500 MG PO TABS
ORAL_TABLET | ORAL | Status: AC
  Filled 2015-11-04: qty 1

## 2015-11-04 MED ORDER — PROPOFOL 10 MG/ML IV BOLUS
INTRAVENOUS | Status: AC
  Filled 2015-11-04: qty 20

## 2015-11-04 MED ORDER — 0.9 % SODIUM CHLORIDE (POUR BTL) OPTIME
TOPICAL | Status: DC | PRN
  Administered 2015-11-04: 1000 mL

## 2015-11-04 MED ORDER — MEPERIDINE HCL 25 MG/ML IJ SOLN: 6.2500 mg | INTRAMUSCULAR | Status: DC | PRN

## 2015-11-04 MED ORDER — MENTHOL 3 MG MT LOZG: 1.0000 | LOZENGE | OROMUCOSAL | Status: DC | PRN

## 2015-11-04 MED ORDER — HYDROCODONE-ACETAMINOPHEN 5-325 MG PO TABS
1.0000 | ORAL_TABLET | ORAL | Status: DC | PRN
  Administered 2015-11-04: 1 via ORAL
  Administered 2015-11-04: 2 via ORAL
  Administered 2015-11-04: 1 via ORAL
  Administered 2015-11-05 – 2015-11-06 (×8): 2 via ORAL
  Filled 2015-11-04: qty 2
  Filled 2015-11-04 (×2): qty 1
  Filled 2015-11-04 (×8): qty 2

## 2015-11-04 MED ORDER — METHOCARBAMOL 500 MG PO TABS
500.0000 mg | ORAL_TABLET | Freq: Four times a day (QID) | ORAL | Status: DC | PRN
  Administered 2015-11-04 – 2015-11-06 (×4): 500 mg via ORAL
  Filled 2015-11-04 (×5): qty 1

## 2015-11-04 MED ORDER — PANTOPRAZOLE SODIUM 40 MG PO TBEC
40.0000 mg | DELAYED_RELEASE_TABLET | Freq: Every day | ORAL | Status: DC
  Administered 2015-11-05 – 2015-11-06 (×2): 40 mg via ORAL
  Filled 2015-11-04 (×2): qty 1

## 2015-11-04 MED ORDER — ONDANSETRON HCL 4 MG PO TABS
4.0000 mg | ORAL_TABLET | Freq: Four times a day (QID) | ORAL | Status: DC | PRN
  Filled 2015-11-04: qty 1

## 2015-11-04 MED ORDER — ACETAMINOPHEN 325 MG PO TABS: 650.0000 mg | ORAL_TABLET | Freq: Four times a day (QID) | ORAL | Status: DC | PRN

## 2015-11-04 MED ORDER — ACETAMINOPHEN 650 MG RE SUPP: 650.0000 mg | Freq: Four times a day (QID) | RECTAL | Status: DC | PRN

## 2015-11-04 MED ORDER — ALUM & MAG HYDROXIDE-SIMETH 200-200-20 MG/5ML PO SUSP: 30.0000 mL | ORAL | Status: DC | PRN

## 2015-11-04 MED ORDER — EPHEDRINE 5 MG/ML INJ
INTRAVENOUS | Status: AC
  Filled 2015-11-04: qty 10

## 2015-11-04 MED ORDER — ONDANSETRON HCL 4 MG/2ML IJ SOLN
INTRAMUSCULAR | Status: DC | PRN
  Administered 2015-11-04: 4 mg via INTRAVENOUS

## 2015-11-04 MED ORDER — PHENOL 1.4 % MT LIQD: 1.0000 | OROMUCOSAL | Status: DC | PRN

## 2015-11-04 MED ORDER — BUPIVACAINE-EPINEPHRINE 0.25% -1:200000 IJ SOLN
INTRAMUSCULAR | Status: DC | PRN
  Administered 2015-11-04: 20 mL

## 2015-11-04 MED ORDER — METOCLOPRAMIDE HCL 5 MG PO TABS: 5.0000 mg | ORAL_TABLET | Freq: Three times a day (TID) | ORAL | Status: DC | PRN

## 2015-11-04 MED ORDER — BUPIVACAINE-EPINEPHRINE (PF) 0.25% -1:200000 IJ SOLN
INTRAMUSCULAR | Status: AC
  Filled 2015-11-04: qty 30

## 2015-11-04 MED ORDER — TRANEXAMIC ACID 1000 MG/10ML IV SOLN
1000.0000 mg | Freq: Once | INTRAVENOUS | Status: AC
  Administered 2015-11-04: 1000 mg via INTRAVENOUS
  Filled 2015-11-04: qty 10

## 2015-11-04 MED ORDER — TRANEXAMIC ACID 1000 MG/10ML IV SOLN
1000.0000 mg | INTRAVENOUS | Status: AC
  Administered 2015-11-04: 1000 mg via INTRAVENOUS
  Filled 2015-11-04: qty 10

## 2015-11-04 MED ORDER — PROPOFOL 500 MG/50ML IV EMUL
INTRAVENOUS | Status: DC | PRN
  Administered 2015-11-04: 75 ug/kg/min via INTRAVENOUS

## 2015-11-04 MED ORDER — BUPIVACAINE LIPOSOME 1.3 % IJ SUSP
INTRAMUSCULAR | Status: DC | PRN
  Administered 2015-11-04: 20 mL

## 2015-11-04 MED ORDER — PROMETHAZINE HCL 25 MG/ML IJ SOLN: 6.2500 mg | INTRAMUSCULAR | Status: DC | PRN

## 2015-11-04 MED ORDER — BISACODYL 5 MG PO TBEC: 5.0000 mg | DELAYED_RELEASE_TABLET | Freq: Every day | ORAL | Status: DC | PRN

## 2015-11-04 MED ORDER — DIPHENHYDRAMINE HCL 12.5 MG/5ML PO ELIX: 12.5000 mg | ORAL_SOLUTION | ORAL | Status: DC | PRN

## 2015-11-04 MED ORDER — LACTATED RINGERS IV SOLN
INTRAVENOUS | Status: DC
Start: 2015-11-04 — End: 2015-11-06

## 2015-11-04 MED ORDER — MIDAZOLAM HCL 2 MG/2ML IJ SOLN
INTRAMUSCULAR | Status: AC
  Filled 2015-11-04: qty 2

## 2015-11-04 MED ORDER — DOCUSATE SODIUM 100 MG PO CAPS
100.0000 mg | ORAL_CAPSULE | Freq: Two times a day (BID) | ORAL | Status: DC
  Administered 2015-11-04 – 2015-11-06 (×4): 100 mg via ORAL
  Filled 2015-11-04 (×4): qty 1

## 2015-11-04 MED ORDER — HYDROMORPHONE HCL 1 MG/ML IJ SOLN
INTRAMUSCULAR | Status: AC
  Filled 2015-11-04: qty 1

## 2015-11-04 MED ORDER — BUPIVACAINE IN DEXTROSE 0.75-8.25 % IT SOLN
INTRATHECAL | Status: DC | PRN
  Administered 2015-11-04: 2 mL via INTRATHECAL

## 2015-11-04 MED ORDER — LOSARTAN POTASSIUM 50 MG PO TABS
100.0000 mg | ORAL_TABLET | Freq: Every day | ORAL | Status: DC
  Administered 2015-11-05 – 2015-11-06 (×2): 100 mg via ORAL
  Filled 2015-11-04 (×2): qty 2

## 2015-11-04 MED ORDER — BUPIVACAINE LIPOSOME 1.3 % IJ SUSP
20.0000 mL | Freq: Once | INTRAMUSCULAR | Status: DC
  Filled 2015-11-04: qty 20

## 2015-11-04 MED ORDER — METHOCARBAMOL 1000 MG/10ML IJ SOLN
500.0000 mg | Freq: Four times a day (QID) | INTRAVENOUS | Status: DC | PRN
  Filled 2015-11-04: qty 5

## 2015-11-04 MED ORDER — LACTATED RINGERS IV SOLN
INTRAVENOUS | Status: DC
  Administered 2015-11-04 (×2): via INTRAVENOUS

## 2015-11-04 MED ORDER — CHLORHEXIDINE GLUCONATE 4 % EX LIQD: 60.0000 mL | Freq: Once | CUTANEOUS | Status: DC

## 2015-11-04 MED ORDER — MIDAZOLAM HCL 5 MG/5ML IJ SOLN
INTRAMUSCULAR | Status: DC | PRN
  Administered 2015-11-04 (×2): 1 mg via INTRAVENOUS

## 2015-11-04 MED ORDER — PHENYLEPHRINE HCL 10 MG/ML IJ SOLN
INTRAVENOUS | Status: DC | PRN
  Administered 2015-11-04: 20 ug/min via INTRAVENOUS

## 2015-11-04 MED ORDER — HYDROMORPHONE HCL 1 MG/ML IJ SOLN
0.5000 mg | INTRAMUSCULAR | Status: DC | PRN
  Administered 2015-11-04: 1 mg via INTRAVENOUS
  Administered 2015-11-04: 0.5 mg via INTRAVENOUS
  Administered 2015-11-05: 1 mg via INTRAVENOUS
  Filled 2015-11-04 (×3): qty 1

## 2015-11-04 MED ORDER — ONDANSETRON HCL 4 MG/2ML IJ SOLN
INTRAMUSCULAR | Status: AC
  Filled 2015-11-04: qty 2

## 2015-11-04 MED ORDER — TRANEXAMIC ACID 1000 MG/10ML IV SOLN
2000.0000 mg | Freq: Once | INTRAVENOUS | Status: DC
  Filled 2015-11-04: qty 20

## 2015-11-04 MED ORDER — LACTATED RINGERS IV SOLN: INTRAVENOUS | Status: DC

## 2015-11-04 MED ORDER — ONDANSETRON HCL 4 MG/2ML IJ SOLN
4.0000 mg | Freq: Four times a day (QID) | INTRAMUSCULAR | Status: DC | PRN
  Administered 2015-11-04 – 2015-11-05 (×2): 4 mg via INTRAVENOUS
  Filled 2015-11-04 (×2): qty 2

## 2015-11-04 MED ORDER — EPHEDRINE SULFATE-NACL 50-0.9 MG/10ML-% IV SOSY
PREFILLED_SYRINGE | INTRAVENOUS | Status: DC | PRN
  Administered 2015-11-04: 10 mg via INTRAVENOUS
  Administered 2015-11-04: 15 mg via INTRAVENOUS

## 2015-11-04 MED ORDER — METOCLOPRAMIDE HCL 5 MG/ML IJ SOLN: 5.0000 mg | Freq: Three times a day (TID) | INTRAMUSCULAR | Status: DC | PRN

## 2015-11-04 MED ORDER — SODIUM CHLORIDE 0.9 % IJ SOLN
INTRAMUSCULAR | Status: DC | PRN
Start: 2015-11-04 — End: 2015-11-04
  Administered 2015-11-04: 40 mL

## 2015-11-04 MED ORDER — SODIUM CHLORIDE 0.9 % IR SOLN
Status: DC | PRN
  Administered 2015-11-04: 3000 mL

## 2015-11-04 MED ORDER — FENTANYL CITRATE (PF) 100 MCG/2ML IJ SOLN
INTRAMUSCULAR | Status: DC | PRN
  Administered 2015-11-04: 50 ug via INTRAVENOUS

## 2015-11-04 MED ORDER — TRANEXAMIC ACID 1000 MG/10ML IV SOLN
INTRAVENOUS | Status: DC | PRN
  Administered 2015-11-04: 2000 mg via INTRAVENOUS

## 2015-11-04 SURGICAL SUPPLY — 61 items
BAG DECANTER FOR FLEXI CONT (MISCELLANEOUS) IMPLANT
BANDAGE ACE 4X5 VEL STRL LF (GAUZE/BANDAGES/DRESSINGS) ×2 IMPLANT
BANDAGE ESMARK 6X9 LF (GAUZE/BANDAGES/DRESSINGS) ×1 IMPLANT
BLADE SAGITTAL 25.0X1.19X90 (BLADE) IMPLANT
BLADE SAW SGTL 13.0X1.19X90.0M (BLADE) IMPLANT
BLADE SURG ROTATE 9660 (MISCELLANEOUS) IMPLANT
BNDG ELASTIC 6X10 VLCR STRL LF (GAUZE/BANDAGES/DRESSINGS) ×2 IMPLANT
BNDG ESMARK 6X9 LF (GAUZE/BANDAGES/DRESSINGS) ×2
BNDG GAUZE ELAST 4 BULKY (GAUZE/BANDAGES/DRESSINGS) ×2 IMPLANT
BOWL SMART MIX CTS (DISPOSABLE) ×2 IMPLANT
CAP KNEE TOTAL 3 SIGMA ×2 IMPLANT
CEMENT HV SMART SET (Cement) ×4 IMPLANT
COVER SURGICAL LIGHT HANDLE (MISCELLANEOUS) ×2 IMPLANT
CUFF TOURNIQUET SINGLE 34IN LL (TOURNIQUET CUFF) ×2 IMPLANT
CUFF TOURNIQUET SINGLE 44IN (TOURNIQUET CUFF) IMPLANT
DECANTER SPIKE VIAL GLASS SM (MISCELLANEOUS) ×2 IMPLANT
DRAPE EXTREMITY T 121X128X90 (DRAPE) ×2 IMPLANT
DRAPE PROXIMA HALF (DRAPES) ×2 IMPLANT
DRAPE U-SHAPE 47X51 STRL (DRAPES) ×2 IMPLANT
DRSG ADAPTIC 3X8 NADH LF (GAUZE/BANDAGES/DRESSINGS) ×2 IMPLANT
DRSG PAD ABDOMINAL 8X10 ST (GAUZE/BANDAGES/DRESSINGS) ×2 IMPLANT
DURAPREP 26ML APPLICATOR (WOUND CARE) ×2 IMPLANT
ELECT REM PT RETURN 9FT ADLT (ELECTROSURGICAL) ×2
ELECTRODE REM PT RTRN 9FT ADLT (ELECTROSURGICAL) ×1 IMPLANT
GAUZE SPONGE 4X4 12PLY STRL (GAUZE/BANDAGES/DRESSINGS) ×2 IMPLANT
GLOVE BIO SURGEON STRL SZ8 (GLOVE) ×4 IMPLANT
GLOVE BIOGEL PI IND STRL 8 (GLOVE) ×2 IMPLANT
GLOVE BIOGEL PI INDICATOR 8 (GLOVE) ×2
GOWN STRL REUS W/ TWL LRG LVL3 (GOWN DISPOSABLE) ×1 IMPLANT
GOWN STRL REUS W/ TWL XL LVL3 (GOWN DISPOSABLE) ×2 IMPLANT
GOWN STRL REUS W/TWL LRG LVL3 (GOWN DISPOSABLE) ×1
GOWN STRL REUS W/TWL XL LVL3 (GOWN DISPOSABLE) ×2
HANDPIECE INTERPULSE COAX TIP (DISPOSABLE) ×1
HOOD PEEL AWAY FACE SHEILD DIS (HOOD) ×4 IMPLANT
IMMOBILIZER KNEE 22 UNIV (SOFTGOODS) ×2 IMPLANT
KIT BASIN OR (CUSTOM PROCEDURE TRAY) ×2 IMPLANT
KIT ROOM TURNOVER OR (KITS) ×2 IMPLANT
MANIFOLD NEPTUNE II (INSTRUMENTS) ×2 IMPLANT
NEEDLE HYPO 21X1 ECLIPSE (NEEDLE) ×2 IMPLANT
NS IRRIG 1000ML POUR BTL (IV SOLUTION) ×2 IMPLANT
PACK TOTAL JOINT (CUSTOM PROCEDURE TRAY) ×2 IMPLANT
PACK UNIVERSAL I (CUSTOM PROCEDURE TRAY) ×2 IMPLANT
PAD ARMBOARD 7.5X6 YLW CONV (MISCELLANEOUS) ×4 IMPLANT
PIN STEINMAN FIXATION KNEE (PIN) ×2 IMPLANT
SET HNDPC FAN SPRY TIP SCT (DISPOSABLE) ×1 IMPLANT
SPONGE GAUZE 4X4 12PLY STER LF (GAUZE/BANDAGES/DRESSINGS) ×2 IMPLANT
STAPLER VISISTAT 35W (STAPLE) IMPLANT
STRIP CLOSURE SKIN 1/2X4 (GAUZE/BANDAGES/DRESSINGS) ×2 IMPLANT
SUCTION FRAZIER HANDLE 10FR (MISCELLANEOUS)
SUCTION TUBE FRAZIER 10FR DISP (MISCELLANEOUS) IMPLANT
SUT MNCRL AB 3-0 PS2 18 (SUTURE) IMPLANT
SUT VIC AB 0 CT1 27 (SUTURE) ×2
SUT VIC AB 0 CT1 27XBRD ANBCTR (SUTURE) ×2 IMPLANT
SUT VIC AB 2-0 CT1 27 (SUTURE) ×2
SUT VIC AB 2-0 CT1 TAPERPNT 27 (SUTURE) ×2 IMPLANT
SUT VLOC 180 0 24IN GS25 (SUTURE) ×2 IMPLANT
SYR 50ML LL SCALE MARK (SYRINGE) ×2 IMPLANT
TOWEL OR 17X24 6PK STRL BLUE (TOWEL DISPOSABLE) ×2 IMPLANT
TOWEL OR 17X26 10 PK STRL BLUE (TOWEL DISPOSABLE) ×2 IMPLANT
TRAY FOLEY CATH 14FR (SET/KITS/TRAYS/PACK) IMPLANT
WATER STERILE IRR 1000ML POUR (IV SOLUTION) ×4 IMPLANT

## 2015-11-04 NOTE — Progress Notes (Signed)
Orthopedic Tech Progress Note Patient Details:  Sheila Gardner 02/09/1936 [id number]  CPM Left Knee CPM Left Knee: On Left Knee Flexion (Degrees): 90 Left Knee Extension (Degrees): 0   [name] 11/04/2015, 12:31 PM ohf will be provided when one becomes available

## 2015-11-04 NOTE — Progress Notes (Signed)
IV was started in right arm.Pt. Then verbalizes she cannot have blood pressures in the right arm. States she had right lumpectomy with a biopsy of a lymph node in 2011. A nodule was not removed. States she has had a previous surgery and the IV was in that arm. . Notified Dr. Mattilynn Forrer Robert, stated the IV would be okay in the right arm.

## 2015-11-04 NOTE — Op Note (Signed)
PREOP DIAGNOSIS: DJD LEFT KNEE POSTOP DIAGNOSIS:  same PROCEDURE: LEFT TKR ANESTHESIA: Spinal and MAC ATTENDING SURGEON: Dakotah Orrego G ASSISTANTLoni Dolly PA  INDICATIONS FOR PROCEDURE: Sheila Gardner is a 80 y.o. female who has struggled for a long time with pain due to degenerative arthritis of the left knee.  The patient has failed many conservative non-operative measures and at this point has pain which limits the ability to sleep and walk.  The patient is offered total knee replacement.  Informed operative consent was obtained after discussion of possible risks of anesthesia, infection, neurovascular injury, DVT, and death.  The importance of the post-operative rehabilitation protocol to optimize result was stressed extensively with the patient.  SUMMARY OF FINDINGS AND PROCEDURE:  Sheila Gardner was taken to the operative suite where under the above anesthesia a left knee replacement was performed.  There were advanced degenerative changes and the bone quality was good.  We used the DePuyLCS system and placed size standard femur, 3 tibia, 32 mm all polyethylene patella, and a size 10 mm spacer.  Loni Dolly PA-C assisted throughout and was invaluable to the completion of the case in that he helped retract and maintain exposure while I placed the components.  He also helped close thereby minimizing OR time.  The patient was admitted for appropriate post-op care to include perioperative antibiotics and mechanical and pharmacologic measures for DVT prophylaxis.  DESCRIPTION OF PROCEDURE:  Sheila Gardner was taken to the operative suite where the above anesthesia was applied.  The patient was positioned supine and prepped and draped in normal sterile fashion.  An appropriate time out was performed.  After the administration of kefzol pre-op antibiotic the leg was elevated and exsanguinated and a tourniquet inflated.  A standard longitudinal incision was made on the anterior knee.  Dissection was  carried down to the extensor mechanism.  All appropriate anti-infective measures were used including the pre-operative antibiotic, betadine impregnated drape, and closed hooded exhaust systems for each member of the surgical team.  A medial parapatellar incision was made in the extensor mechanism and the knee cap flipped and the knee flexed.  Some residual meniscal tissues were removed along with any remaining ACL/PCL tissue.  A guide was placed on the tibia and a flat cut was made on it's superior surface.  An intramedullary guide was placed in the femur and was utilized to make anterior and posterior cuts creating an appropriate flexion gap.  A second intramedullary guide was placed in the femur to make a distal cut properly balancing the knee with an extension gap equal to the flexion gap.  The three bones sized to the above mentioned sizes and the appropriate guides were placed and utilized.  A trial reduction was done and the knee easily came to full extension and the patella tracked well on flexion.  The trial components were removed and all bones were cleaned with pulsatile lavage and then dried thoroughly.  Cement was mixed and was pressurized onto the bones followed by placement of the aforementioned components.  Excess cement was trimmed and pressure was held on the components until the cement had hardened.  The tourniquet was deflated and a small amount of bleeding was controlled with cautery and pressure.  The knee was irrigated thoroughly.  The extensor mechanism was re-approximated with V-loc suture in running fashion.  The knee was flexed and the repair was solid.  The subcutaneous tissues were re-approximated with #0 and #2-0 vicryl and the skin closed  with a subcuticular stitch and steristrips.  A sterile dressing was applied.  Intraoperative fluids, EBL, and tourniquet time can be obtained from anesthesia records.  DISPOSITION:  The patient was taken to recovery room in stable condition and  admitted for appropriate post-op care to include peri-operative antibiotic and DVT prophylaxis with mechanical and pharmacologic measures.  Emmah Bratcher G 11/04/2015, 11:17 AM

## 2015-11-04 NOTE — Anesthesia Preprocedure Evaluation (Addendum)
Anesthesia Evaluation  Patient identified by MRN, date of birth, ID band Patient awake    Reviewed: Allergy & Precautions, NPO status , Patient's Chart, lab work & pertinent test results  History of Anesthesia Complications (+) PONV and history of anesthetic complications  Airway Mallampati: II  TM Distance: >3 FB Neck ROM: Full    Dental  (+) Dental Advisory Given, Edentulous Upper, Edentulous Lower   Pulmonary neg pulmonary ROS,    breath sounds clear to auscultation       Cardiovascular hypertension, Pt. on medications  Rhythm:Regular Rate:Normal     Neuro/Psych negative neurological ROS  negative psych ROS   GI/Hepatic Neg liver ROS, GERD  Medicated and Controlled,  Endo/Other  negative endocrine ROS  Renal/GU negative Renal ROS  negative genitourinary   Musculoskeletal  (+) Arthritis , Osteoarthritis,    Abdominal   Peds negative pediatric ROS (+)  Hematology negative hematology ROS (+)   Anesthesia Other Findings   Reproductive/Obstetrics negative OB ROS                           Lab Results  Component Value Date   WBC 7.7 10/24/2015   HGB 12.6 10/24/2015   HCT 38.8 10/24/2015   MCV 90.2 10/24/2015   PLT 287 10/24/2015   Lab Results  Component Value Date   CREATININE 0.57 10/24/2015   BUN 14 10/24/2015   NA 138 10/24/2015   K 3.6 10/24/2015   CL 101 10/24/2015   CO2 29 10/24/2015   Lab Results  Component Value Date   INR 1.02 10/24/2015   INR 1.01 07/19/2014   07/2014 EKG: normal sinus rhythm.  Anesthesia Physical Anesthesia Plan  ASA: II  Anesthesia Plan: Spinal   Post-op Pain Management:    Induction: Intravenous  Airway Management Planned: Natural Airway  Additional Equipment:   Intra-op Plan:   Post-operative Plan:   Informed Consent: I have reviewed the patients History and Physical, chart, labs and discussed the procedure including the risks,  benefits and alternatives for the proposed anesthesia with the patient or authorized representative who has indicated his/her understanding and acceptance.   Dental advisory given  Plan Discussed with: CRNA, Anesthesiologist and Surgeon  Anesthesia Plan Comments:        Anesthesia Quick Evaluation

## 2015-11-04 NOTE — Anesthesia Procedure Notes (Signed)
Spinal  Patient location during procedure: OR Start time: 11/04/2015 9:45 AM End time: 11/04/2015 9:50 AM Staffing Anesthesiologist: Suella Broad D Preanesthetic Checklist Completed: patient identified, site marked, surgical consent, pre-op evaluation, timeout performed, IV checked, risks and benefits discussed and monitors and equipment checked Spinal Block Patient position: sitting Prep: Betadine Patient monitoring: heart rate, continuous pulse ox, blood pressure and cardiac monitor Approach: midline Location: L4-5 Injection technique: single-shot Needle Needle type: Whitacre and Introducer  Needle gauge: 24 G Needle length: 9 cm Additional Notes Negative paresthesia. Negative blood return. Positive free-flowing CSF. Expiration date of kit checked and confirmed. Patient tolerated procedure well, without complications.

## 2015-11-04 NOTE — Evaluation (Signed)
Physical Therapy Evaluation Patient Details Name: Sheila Gardner MRN: [id number] DOB: [birth year] Today's Date: 11/04/2015   History of Present Illness  Patient is a 80 y/o female with hx of Rt THA, HTN, HLD, breast ca presents s/p left TKA.  Clinical Impression  Patient presents with pain, nausea and post surgical deficits LLE s/p Lft TKA. Tolerated transfers and gait training with Min-min guard assist for safety. Ambulation distance limited due to pain and nausea. Education re: exercises, precautions, postioning etc. Pt independent PTA. Plans to discharge to short term SNF to maximize independence and mobility prior to return home.     Follow Up Recommendations SNF    Equipment Recommendations  None recommended by PT    Recommendations for Other Services       Precautions / Restrictions Precautions Precautions: Knee Precaution Booklet Issued: No Precaution Comments: Reviewed no pillow under knee and precautions. Restrictions Weight Bearing Restrictions: Yes LLE Weight Bearing: Weight bearing as tolerated      Mobility  Bed Mobility Overal bed mobility: Needs Assistance Bed Mobility: Supine to Sit     Supine to sit: Min guard;HOB elevated     General bed mobility comments: Able to bring BLEs to EOB with use of rail. Assist to bring LLE into bed to return to supine. Increased time.  Transfers Overall transfer level: Needs assistance Equipment used: Rolling walker (2 wheeled) Transfers: Sit to/from Stand Sit to Stand: Min guard         General transfer comment: Min guard to steady in standing from EOB x1, from toilet x1. Cues for hand placement.   Ambulation/Gait Ambulation/Gait assistance: Min assist Ambulation Distance (Feet): 30 Feet Assistive device: Rolling walker (2 wheeled) Gait Pattern/deviations: Step-to pattern;Step-through pattern;Decreased stance time - left;Decreased step length - right;Trunk flexed Gait velocity: decreased   General Gait Details:  Slow, unsteady gait with increased WB through BUEs. 1 seated rest break. + nausea.   Stairs            Wheelchair Mobility    Modified Rankin (Stroke Patients Only)       Balance Overall balance assessment: Needs assistance Sitting-balance support: Feet supported;No upper extremity supported Sitting balance-Leahy Scale: Good Sitting balance - Comments: Able to perform pericare without assist or difficulty.    Standing balance support: During functional activity Standing balance-Leahy Scale: Poor Standing balance comment: Reliant on BUEs for support.                             Pertinent Vitals/Pain Pain Assessment: 0-10 Pain Score: 10-Worst pain ever Pain Location: left knee with movement Pain Descriptors / Indicators: Sore;Guarding;Grimacing;Operative site guarding Pain Intervention(s): Monitored during session;Repositioned;Patient requesting pain meds-RN notified;Limited activity within patient's tolerance    Home Living Family/patient expects to be discharged to:: Skilled nursing facility Living Arrangements: Alone Available Help at Discharge: Family;Available PRN/intermittently Type of Home: House Home Access: Stairs to enter Entrance Stairs-Rails: None Entrance Stairs-Number of Steps: 1 Home Layout: Two level;Bed/bath upstairs Home Equipment: Walker - 2 wheels;Bedside commode      Prior Function Level of Independence: Independent         Comments: Drives.      Hand Dominance   Dominant Hand: Right    Extremity/Trunk Assessment   Upper Extremity Assessment: Defer to OT evaluation           Lower Extremity Assessment: LLE deficits/detail   LLE Deficits / Details: Limited AROM/strength secondary to pain and surgery  Communication   Communication: No difficulties  Cognition Arousal/Alertness: Awake/alert Behavior During Therapy: WFL for tasks assessed/performed Overall Cognitive Status: Within Functional Limits for tasks  assessed                      General Comments      Exercises Total Joint Exercises Ankle Circles/Pumps: Both;10 reps;Supine Quad Sets: Both;10 reps;Supine      Assessment/Plan    PT Assessment Patient needs continued PT services  PT Diagnosis Difficulty walking;Acute pain   PT Problem List Decreased strength;Decreased mobility;Decreased knowledge of precautions;Decreased range of motion;Decreased activity tolerance;Decreased balance;Pain;Impaired sensation  PT Treatment Interventions DME instruction;Therapeutic activities;Gait training;Therapeutic exercise;Patient/family education;Balance training;Functional mobility training   PT Goals (Current goals can be found in the Care Plan section) Acute Rehab PT Goals Patient Stated Goal: to be able to play with my grandchildren PT Goal Formulation: With patient Time For Goal Achievement: 11/18/15 Potential to Achieve Goals: Good    Frequency 7X/week   Barriers to discharge Decreased caregiver support      Co-evaluation               End of Session Equipment Utilized During Treatment: Gait belt Activity Tolerance: Patient limited by pain Patient left: in bed;with call bell/phone within reach Nurse Communication: Mobility status         Time: [id number] PT Time Calculation (min) (ACUTE ONLY): 23 min   Charges:   PT Evaluation $PT Eval Low Complexity: 1 Procedure PT Treatments $Gait Training: 8-22 mins   PT G Codes:        Emonie Espericueta A Leslie Langille 11/04/2015, 4:19 PM Wray Kearns, [place], DPT [phone]

## 2015-11-04 NOTE — Interval H&P Note (Signed)
History and Physical Interval Note:  11/04/2015 9:37 AM  Sheila Gardner  has presented today for surgery, with the diagnosis of LEFT KNEE DEGENERATIVE JOINT DISEASE  The various methods of treatment have been discussed with the patient and family. After consideration of risks, benefits and other options for treatment, the patient has consented to  Procedure(s): TOTAL KNEE ARTHROPLASTY (Left) as a surgical intervention .  The patient's history has been reviewed, patient examined, no change in status, stable for surgery.  I have reviewed the patient's chart and labs.  Questions were answered to the patient's satisfaction.     Tal Neer G

## 2015-11-04 NOTE — Anesthesia Postprocedure Evaluation (Signed)
Anesthesia Post Note  Patient: ADELINE RANEY  Procedure(s) Performed: Procedure(s) (LRB): TOTAL KNEE ARTHROPLASTY (Left)  Patient location during evaluation: PACU Anesthesia Type: Spinal Level of consciousness: oriented and awake and alert Pain management: pain level controlled Vital Signs Assessment: post-procedure vital signs reviewed and stable Respiratory status: spontaneous breathing, respiratory function stable and patient connected to nasal cannula oxygen Cardiovascular status: blood pressure returned to baseline and stable Postop Assessment: no headache, no backache and spinal receding Anesthetic complications: no    Last Vitals:  Vitals:   11/04/15 1415 11/04/15 1430  BP: (!) 125/57 (!) 127/52  Pulse: (!) 58   Resp: 13   Temp:  36.5 C    Last Pain:  Vitals:   11/04/15 1430  TempSrc:   PainSc: [place] Aleen Marston

## 2015-11-04 NOTE — Transfer of Care (Signed)
Immediate Anesthesia Transfer of Care Note  Patient: Sheila Gardner  Procedure(s) Performed: Procedure(s): TOTAL KNEE ARTHROPLASTY (Left)  Patient Location: PACU  Anesthesia Type:Spinal  Level of Consciousness: awake, alert , oriented and patient cooperative  Airway & Oxygen Therapy: Patient Spontanous Breathing and Patient connected to nasal cannula oxygen  Post-op Assessment: Report given to RN and Post -op Vital signs reviewed and stable  Post vital signs: Reviewed and stable  Last Vitals:  Vitals:   11/04/15 0806  BP: 131/60  Pulse: 61  Resp: 20  Temp: 36.7 C    Last Pain:  Vitals:   11/04/15 0806  TempSrc: Oral      Patients Stated Pain Goal: 5 ([id number])  Complications: No apparent anesthesia complications

## 2015-11-04 NOTE — Progress Notes (Signed)
Dentures and glasses returned to pt per pts request

## 2015-11-04 NOTE — Anesthesia Procedure Notes (Signed)
Procedure Name: MAC Date/Time: 11/04/2015 9:59 AM Performed by: Carney Living Oxygen Delivery Method: Simple face mask

## 2015-11-04 NOTE — Progress Notes (Signed)
Orthopedic Tech Progress Note Patient Details:  Sheila Gardner 12-08-1935 [id number] Ortho visit put on cpm at 1900 Patient ID: Sheila Gardner, female   DOB: 18-Apr-1935, 80 y.o.   MRN: [id number]   Braulio Bosch 11/04/2015, 7:03 PM

## 2015-11-04 NOTE — Anesthesia Procedure Notes (Signed)
Procedure Name: MAC Date/Time: 11/04/2015 9:41 AM Performed by: Carney Living Pre-anesthesia Checklist: Patient identified, Emergency Drugs available, Suction available, Patient being monitored and Timeout performed Patient Re-evaluated:Patient Re-evaluated prior to inductionOxygen Delivery Method: Nasal cannula

## 2015-11-05 ENCOUNTER — Encounter (HOSPITAL_COMMUNITY): Payer: Self-pay | Admitting: Orthopaedic Surgery

## 2015-11-05 MED ORDER — PROMETHAZINE HCL 25 MG PO TABS
12.5000 mg | ORAL_TABLET | Freq: Four times a day (QID) | ORAL | Status: DC | PRN
  Administered 2015-11-05: 25 mg via ORAL
  Filled 2015-11-05: qty 1

## 2015-11-05 NOTE — Progress Notes (Signed)
PT Cancellation Note  Patient Details Name: Sheila Gardner MRN: [id number] DOB: 05/16/1935   Cancelled Treatment:    Reason Eval/Treat Not Completed: Fatigue/lethargy limiting ability to participate (Pt sleeping on arrival in CPM and refused pm tx.  )   Cristela Blue 11/05/2015, 5:08 PM  Governor Rooks, PTA pager [phone]

## 2015-11-05 NOTE — Evaluation (Signed)
Occupational Therapy Evaluation Patient Details Name: OLUWATOSIN FRIEBEL MRN: [id number] DOB: [birth year] Today's Date: 11/05/2015    History of Present Illness Patient is a 80 y/o female with hx of Rt THA, HTN, HLD, breast ca presents s/p left TKA.   Clinical Impression   Pt was admitted for the above sx. She was independent with adls prior to this sx. She will benefit from continued OT in acute setting and follow up at SNF to restore independence with adls. Goals for basic ADLs in acute are for supervision level.  Pt needs min guard for transfers and up to mod A for LB adls at this time    Follow Up Recommendations  SNF    Equipment Recommendations  3 in 1 bedside comode    Recommendations for Other Services       Precautions / Restrictions Precautions Precautions: Knee Precaution Booklet Issued: No Precaution Comments: Reviewed no pillow under knee and precautions. Restrictions LLE Weight Bearing: Weight bearing as tolerated      Mobility Bed Mobility           Sit to supine: Min assist   General bed mobility comments: assist for LLE  Transfers   Equipment used: Rolling walker (2 wheeled)   Sit to Stand: Min guard         General transfer comment: Min guard to steady in standing from EOB x1, from toilet x1. Cues for hand placement.     Balance                                            ADL Overall ADL's : Needs assistance/impaired     Grooming: Set up;Sitting   Upper Body Bathing: Set up;Sitting   Lower Body Bathing: Minimal assistance;Sit to/from stand   Upper Body Dressing : Set up;Sitting   Lower Body Dressing: Moderate assistance;Sit to/from stand   Toilet Transfer: Min guard;Stand-pivot;RW (chair to bed)             General ADL Comments: educated pt on AE for ADLs.  She is able to lift LLE up slightly from floor. Also showed her crossing R under L ankle for more support. Reviewed knee precautions and KI.        Vision     Perception     Praxis      Pertinent Vitals/Pain Pain Score: 6  Pain Location: L knee Pain Descriptors / Indicators: Sore Pain Intervention(s): Limited activity within patient's tolerance;Monitored during session;Premedicated before session;Repositioned;Ice applied     Hand Dominance     Extremity/Trunk Assessment Upper Extremity Assessment Upper Extremity Assessment: Overall WFL for tasks assessed           Communication Communication Communication: No difficulties   Cognition Arousal/Alertness: Awake/alert Behavior During Therapy: WFL for tasks assessed/performed Overall Cognitive Status: Within Functional Limits for tasks assessed                     General Comments       Exercises       Shoulder Instructions      Home Living Family/patient expects to be discharged to:: Skilled nursing facility Living Arrangements: Alone                                      Prior  Functioning/Environment Level of Independence: Independent        Comments: Drives.     OT Diagnosis: Acute pain   OT Problem List: Decreased strength;Decreased activity tolerance;Decreased knowledge of use of DME or AE;Pain   OT Treatment/Interventions: Self-care/ADL training;DME and/or AE instruction;Patient/family education    OT Goals(Current goals can be found in the care plan section) Acute Rehab OT Goals Patient Stated Goal: to be able to play with my grandchildren OT Goal Formulation: With patient Time For Goal Achievement: 11/12/15 Potential to Achieve Goals: Good ADL Goals Pt Will Perform Grooming: with supervision;standing Pt Will Perform Lower Body Bathing: with supervision;with adaptive equipment;sit to/from stand Pt Will Transfer to Toilet: with supervision;ambulating;bedside commode Pt Will Perform Toileting - Clothing Manipulation and hygiene: with supervision;sit to/from stand  OT Frequency: Min 2X/week   Barriers to D/C:             Co-evaluation              End of Session CPM Left Knee CPM Left Knee: Off Nurse Communication:  (02 off; sats 90s.  Wants CPM after lunch)  Activity Tolerance: Patient tolerated treatment well Patient left: in bed;with call bell/phone within reach   Time: [id number] OT Time Calculation (min): 24 min Charges:  OT General Charges $OT Visit: 1 Procedure OT Evaluation $OT Eval Low Complexity: 1 Procedure G-Codes:    Adriella Essex 11/09/15, 2:02 PM Lesle Chris, OTR/L [phone] 11-09-15

## 2015-11-05 NOTE — Clinical Social Work Note (Signed)
Patient has bed at [facility]. Patient is a bundled patient and does not need 3 midnights for Medicare approval.    Bed available when ready for discharge.  Nonnie Done, LCSW ([id number]) [id number]  Licensed Clinical Social Worker

## 2015-11-05 NOTE — Progress Notes (Addendum)
Physical Therapy Treatment Patient Details Name: Sheila Gardner MRN: [id number] DOB: 20-Apr-1935 Today's Date: 11/05/2015    History of Present Illness Patient is a 80 y/o female with hx of Rt THA, HTN, HLD, breast ca presents s/p left TKA.    PT Comments    Pt remains to require assist with all mobility.  Pt performed exercises to improve strength and ROM.  D/c recommendations remain for SNF placement.    Follow Up Recommendations  SNF     Equipment Recommendations  None recommended by PT    Recommendations for Other Services       Precautions / Restrictions Precautions Precautions: Knee Precaution Booklet Issued: No Precaution Comments: Reviewed no pillow under knee and precautions. Restrictions Weight Bearing Restrictions: Yes LLE Weight Bearing: Weight bearing as tolerated    Mobility  Bed Mobility Overal bed mobility: Needs Assistance Bed Mobility: Supine to Sit     Supine to sit: HOB elevated;Mod assist     General bed mobility comments: assist for LLE and scooting to edge of bed.    Transfers Overall transfer level: Needs assistance Equipment used: Rolling walker (2 wheeled) Transfers: Sit to/from Stand Sit to Stand: Min assist         General transfer comment: Cues for hand placement and forward weight shifting.  pt required assist to boost.  Poor eccentric loading noted from stand to sit.    Ambulation/Gait Ambulation/Gait assistance: Min assist Ambulation Distance (Feet): 15 Feet (+ 26 ft) Assistive device: Rolling walker (2 wheeled) Gait Pattern/deviations: Step-to pattern;Step-through pattern;Decreased stride length;Decreased stance time - left;Trunk flexed;Antalgic     General Gait Details: Slow, unsteady gait with increased WB through BUEs.  Cues for upper trunk control and gait symmetry.    Stairs            Wheelchair Mobility    Modified Rankin (Stroke Patients Only)       Balance Overall balance assessment: Needs  assistance   Sitting balance-Leahy Scale: Good       Standing balance-Leahy Scale: Poor                      Cognition Arousal/Alertness: Awake/alert Behavior During Therapy: WFL for tasks assessed/performed Overall Cognitive Status: Within Functional Limits for tasks assessed                      Exercises Total Joint Exercises Ankle Circles/Pumps: AROM;Both;10 reps;Supine Quad Sets: AROM;Left;10 reps;Supine Short Arc Quad: Left;10 reps;Supine;AAROM Heel Slides: Left;10 reps;Supine;AAROM Hip ABduction/ADduction: Left;10 reps;Supine;AAROM Straight Leg Raises: Left;10 reps;Supine;AAROM Goniometric ROM: 58 degree in R knee.     General Comments        Pertinent Vitals/Pain Pain Assessment: 0-10 Pain Score: 6  Pain Location: L knee Pain Descriptors / Indicators: Sore Pain Intervention(s): Limited activity within patient's tolerance;Repositioned;Ice applied    Home Living                      Prior Function            PT Goals (current goals can now be found in the care plan section) Acute Rehab PT Goals Patient Stated Goal: to be able to play with my grandchildren Potential to Achieve Goals: Good Progress towards PT goals: Progressing toward goals    Frequency  7X/week    PT Plan Current plan remains appropriate    Co-evaluation             End of  Session Equipment Utilized During Treatment: Gait belt Activity Tolerance: Patient limited by pain Patient left: in bed;with call bell/phone within reach     Time: 1207-1240 PT Time Calculation (min) (ACUTE ONLY): 33 min  Charges:  $Gait Training: 8-22 mins $Therapeutic Exercise: 8-22 mins                    G Codes:      Cristela Blue 2015-12-02, 5:37 PM  Governor Rooks, PTA pager [phone]

## 2015-11-05 NOTE — Clinical Social Work Placement (Signed)
   CLINICAL SOCIAL WORK PLACEMENT  NOTE  Date:  11/05/2015  Patient Details  Name: Sheila Gardner MRN: [id number] Date of Birth: 08-13-1935  Clinical Social Work is seeking post-discharge placement for this patient at the [place] level of care (*CSW will initial, date and re-position this form in  chart as items are completed):  Yes   Patient/family provided with [facility] [place] Work Department's list of facilities offering this level of care within the geographic area requested by the patient (or if unable, by the patient's family).  Yes   Patient/family informed of their freedom to choose among providers that offer the needed level of care, that participate in Medicare, Medicaid or managed care program needed by the patient, have an available bed and are willing to accept the patient.  Yes   Patient/family informed of [facility]'s ownership interest in [facility] and [facility], as well as of the fact that they are under no obligation to receive care at these facilities.  PASRR submitted to EDS on       PASRR number received on       Existing PASRR number confirmed on 11/05/15     FL2 transmitted to all facilities in geographic area requested by pt/family on 11/05/15     FL2 transmitted to all facilities within larger geographic area on       Patient informed that his/her managed care company has contracts with or will negotiate with certain facilities, including the following:        Yes   Patient/family informed of bed offers received.  Patient chooses bed at [facility]     Physician recommends and patient chooses bed at [facility] (bundle patient)    Patient to be transferred to [facility] on 11/06/15.  Patient to be transferred to facility by       Patient family notified on 11/05/15 of transfer.  Name of family member notified:  patient is alert and oriented; daughter at bedside     PHYSICIAN Please sign FL2, Please  prepare prescriptions, Please prepare priority discharge summary, including medications     Additional Comment:    _______________________________________________ Dulcy Fanny, LCSW 11/05/2015, 2:02 PM

## 2015-11-05 NOTE — NC FL2 (Signed)
[place] LEVEL OF CARE SCREENING TOOL     IDENTIFICATION  Patient Name: Sheila Gardner Birthdate: 01/17/1936 Sex: female Admission Date (Current Location): 11/04/2015  [facility] [place] Number:  Herbalist and Address:  [facility], [place] [address] [place], [place] [address]      Provider Number: [id number]  Attending Physician Name and Address:  Melrose Nakayama, MD  Relative Name and Phone Number:       Current Level of Care: Hospital Recommended Level of Care: [place] Prior Approval Number:    Date Approved/Denied:   PASRR Number: [id number]  Discharge Plan: SNF    Current Diagnoses: Patient Active Problem List   Diagnosis Date Noted  . Primary osteoarthritis of left knee 11/04/2015  . Primary osteoarthritis of right hip 07/30/2014  . Hip pain 05/08/2014  . Elevated liver function tests 05/09/2013  . Vitamin D deficiency 03/25/2011  . Breast cancer of upper-outer quadrant of right female breast ([place]) 12/30/2010    Orientation RESPIRATION BLADDER Height & Weight     Self, Time, Situation, Place  Normal Continent Weight:   Height:     BEHAVIORAL SYMPTOMS/MOOD NEUROLOGICAL BOWEL NUTRITION STATUS      Continent Diet  AMBULATORY STATUS COMMUNICATION OF NEEDS Skin   Limited Assist Verbally Surgical wounds                       Personal Care Assistance Level of Assistance  Bathing, Dressing Bathing Assistance: Limited assistance   Dressing Assistance: Limited assistance     Functional Limitations Info             SPECIAL CARE FACTORS FREQUENCY  PT (By licensed PT), OT (By licensed OT)     PT Frequency: daily OT Frequency: daily            Contractures Contractures Info: Not present    Additional Factors Info  Allergies, Code Status Code Status Info: FULL Allergies Info: NKA           Current Medications (11/05/2015):  This is the current hospital active medication  list Current Facility-Administered Medications  Medication Dose Route Frequency Provider Last Rate Last Dose  . acetaminophen (TYLENOL) tablet 650 mg  650 mg Oral Q6H PRN Loni Dolly, PA-C       Or  . acetaminophen (TYLENOL) suppository 650 mg  650 mg Rectal Q6H PRN Loni Dolly, PA-C      . alum & mag hydroxide-simeth (MAALOX/MYLANTA) 200-200-20 MG/5ML suspension 30 mL  30 mL Oral Q4H PRN Loni Dolly, PA-C      . aspirin EC tablet 325 mg  325 mg Oral BID PC Loni Dolly, PA-C   325 mg at 11/05/15 1124  . bisacodyl (DULCOLAX) EC tablet 5 mg  5 mg Oral Daily PRN Loni Dolly, PA-C      . diphenhydrAMINE (BENADRYL) 12.5 MG/5ML elixir 12.5-25 mg  12.5-25 mg Oral Q4H PRN Loni Dolly, PA-C      . docusate sodium (COLACE) capsule 100 mg  100 mg Oral BID Loni Dolly, PA-C   100 mg at 11/05/15 1124  . HYDROcodone-acetaminophen (NORCO/VICODIN) 5-325 MG per tablet 1-2 tablet  1-2 tablet Oral Q4H PRN Loni Dolly, PA-C   2 tablet at 11/05/15 1125  . HYDROmorphone (DILAUDID) injection 0.5-1 mg  0.5-1 mg Intravenous Q3H PRN Loni Dolly, PA-C   1 mg at 11/05/15 [id number]  . lactated ringers infusion   Intravenous Continuous Loni Dolly, PA-C      .  losartan (COZAAR) tablet 100 mg  100 mg Oral Daily Loni Dolly, PA-C   100 mg at 11/05/15 1124  . menthol-cetylpyridinium (CEPACOL) lozenge 3 mg  1 lozenge Oral PRN Loni Dolly, PA-C       Or  . phenol (CHLORASEPTIC) mouth spray 1 spray  1 spray Mouth/Throat PRN Loni Dolly, PA-C      . methocarbamol (ROBAXIN) tablet 500 mg  500 mg Oral Q6H PRN Loni Dolly, PA-C   500 mg at 11/05/15 0429   Or  . methocarbamol (ROBAXIN) 500 mg in dextrose 5 % 50 mL IVPB  500 mg Intravenous Q6H PRN Loni Dolly, PA-C      . metoCLOPramide (REGLAN) tablet 5-10 mg  5-10 mg Oral Q8H PRN Loni Dolly, PA-C       Or  . metoCLOPramide (REGLAN) injection 5-10 mg  5-10 mg Intravenous Q8H PRN Loni Dolly, PA-C      . ondansetron [facility]) tablet 4 mg  4 mg Oral Q6H PRN Loni Dolly, PA-C       Or  .  ondansetron [facility]) injection 4 mg  4 mg Intravenous Q6H PRN Loni Dolly, PA-C   4 mg at 11/05/15 0747  . pantoprazole (PROTONIX) EC tablet 40 mg  40 mg Oral Daily Loni Dolly, PA-C   40 mg at 11/05/15 1125  . promethazine (PHENERGAN) tablet 12.5-25 mg  12.5-25 mg Oral Q6H PRN Loni Dolly, PA-C   25 mg at 11/05/15 [id number]     Discharge Medications: Please see discharge summary for a list of discharge medications.  Relevant Imaging Results:  Relevant Lab Results:   Additional Information SSN: [id number]  Dulcy Fanny, LCSW

## 2015-11-05 NOTE — Progress Notes (Signed)
Orthopedic Tech Progress Note Patient Details:  Sheila Gardner [birth year] [id number]  Patient ID: Sheila Gardner, female   DOB: [birth year], 80 y.o.   MRN: [id number]   Sheila Gardner 11/05/2015, 2:22 PM Placed pt's lle on cpm @ 0-60 degrees @1420 ; RN notified

## 2015-11-05 NOTE — Progress Notes (Signed)
Orthopedic Tech Progress Note Patient Details:  [name] November 01, 1935 [id number] Ortho visit put on cpm at 1935 Patient ID: Sheila Gardner, female   DOB: [birth year], 81 y.o.   MRN: [id number]   Braulio Bosch 11/05/2015, 7:38 PM

## 2015-11-05 NOTE — Progress Notes (Signed)
Subjective: 1 Day Post-Op Procedure(s) (LRB): TOTAL KNEE ARTHROPLASTY (Left)  Patient resting comfortably in bed but has some nausea this morning.  Activity level:  wbat Diet tolerance:  ok Voiding:  ok Patient reports pain as mild and moderate.    Objective: Vital signs in last 24 hours: Temp:  [97.5 F (36.4 C)-98.9 F (37.2 C)] 98.2 F (36.8 C) (08/30 0448) Pulse Rate:  [52-70] 59 (08/30 0448) Resp:  [9-19] 18 (08/30 0448) BP: (81-164)/(40-85) 133/42 (08/30 0448) SpO2:  [93 %-100 %] 100 % (08/30 0448)  Labs: No results for input(s): HGB in the last 72 hours. No results for input(s): WBC, RBC, HCT, PLT in the last 72 hours. No results for input(s): NA, K, CL, CO2, BUN, CREATININE, GLUCOSE, CALCIUM in the last 72 hours. No results for input(s): LABPT, INR in the last 72 hours.  Physical Exam:  Neurologically intact ABD soft Neurovascular intact Sensation intact distally Intact pulses distally Dorsiflexion/Plantar flexion intact Incision: dressing C/D/I and no drainage No cellulitis present Compartment soft  Assessment/Plan:  1 Day Post-Op Procedure(s) (LRB): TOTAL KNEE ARTHROPLASTY (Left) Advance diet Up with therapy D/C IV fluids Plan for discharge tomorrow Discharge to [facility] place if doing well and cleared by PT. I added phenergan due to continued nausea. I will change dressing to aquacel tomorrow. Follow up in office 2 weeks post op. Continue on ASA 325 mg BID x 2 weeks post op.  Sheila Gardner, Larwance Sachs 11/05/2015, 8:14 AM

## 2015-11-06 DIAGNOSIS — D62 Acute posthemorrhagic anemia: Secondary | ICD-10-CM | POA: Diagnosis not present

## 2015-11-06 DIAGNOSIS — R2681 Unsteadiness on feet: Secondary | ICD-10-CM | POA: Diagnosis not present

## 2015-11-06 DIAGNOSIS — I10 Essential (primary) hypertension: Secondary | ICD-10-CM | POA: Diagnosis not present

## 2015-11-06 DIAGNOSIS — Z96651 Presence of right artificial knee joint: Secondary | ICD-10-CM | POA: Diagnosis present

## 2015-11-06 DIAGNOSIS — K219 Gastro-esophageal reflux disease without esophagitis: Secondary | ICD-10-CM | POA: Diagnosis not present

## 2015-11-06 DIAGNOSIS — E559 Vitamin D deficiency, unspecified: Secondary | ICD-10-CM | POA: Diagnosis not present

## 2015-11-06 DIAGNOSIS — E876 Hypokalemia: Secondary | ICD-10-CM | POA: Diagnosis not present

## 2015-11-06 DIAGNOSIS — Z471 Aftercare following joint replacement surgery: Secondary | ICD-10-CM | POA: Diagnosis present

## 2015-11-06 DIAGNOSIS — M1712 Unilateral primary osteoarthritis, left knee: Secondary | ICD-10-CM | POA: Diagnosis not present

## 2015-11-06 DIAGNOSIS — K5901 Slow transit constipation: Secondary | ICD-10-CM | POA: Diagnosis not present

## 2015-11-06 DIAGNOSIS — G894 Chronic pain syndrome: Secondary | ICD-10-CM | POA: Diagnosis not present

## 2015-11-06 DIAGNOSIS — M25569 Pain in unspecified knee: Secondary | ICD-10-CM | POA: Diagnosis not present

## 2015-11-06 MED ORDER — METHOCARBAMOL 500 MG PO TABS: 500.0000 mg | ORAL_TABLET | Freq: Four times a day (QID) | ORAL | 0 refills | Status: DC | PRN

## 2015-11-06 MED ORDER — ASPIRIN 325 MG PO TBEC: 325.0000 mg | DELAYED_RELEASE_TABLET | Freq: Two times a day (BID) | ORAL | 0 refills | Status: DC

## 2015-11-06 MED ORDER — HYDROCODONE-ACETAMINOPHEN 5-325 MG PO TABS: 1.0000 | ORAL_TABLET | ORAL | 0 refills | Status: DC | PRN

## 2015-11-06 NOTE — Progress Notes (Signed)
Orthopedic Tech Progress Note Patient Details:  Sheila Gardner 1935/09/12 [id number]  Patient ID: [name], female   DOB: 10-31-1935, 80 y.o.   MRN: [id number] Applied cpm 0-60  Karolee Stamps 11/06/2015, 6:32 AM

## 2015-11-06 NOTE — Discharge Summary (Signed)
Patient ID: Sheila Gardner MRN: [id number] DOB/AGE: 1936/01/19 80 y.o.  Admit date: 11/04/2015 Discharge date: 11/06/2015  Admission Diagnoses:  Principal Problem:   Primary osteoarthritis of left knee   Discharge Diagnoses:  Same  Past Medical History:  Diagnosis Date  . Arthritis   . Breast cancer ([place])   . Cancer ([place])    Breast- rt  . Cough with sputum 2016  . GERD (gastroesophageal reflux disease)    COSTOCHONDRITIS  . Heart murmur   . Hyperlipidemia   . Hypertension   . Lipoma    back of neck  . PONV (postoperative nausea and vomiting)    only after rt hip surgery    Surgeries: Procedure(s): TOTAL KNEE ARTHROPLASTY on 11/04/2015   Consultants:   Discharged Condition: Improved  Hospital Course: Sheila Gardner is an 80 y.o. female who was admitted 11/04/2015 for operative treatment ofPrimary osteoarthritis of left knee. Patient has severe unremitting pain that affects sleep, daily activities, and work/hobbies. After pre-op clearance the patient was taken to the operating room on 11/04/2015 and underwent  Procedure(s): TOTAL KNEE ARTHROPLASTY.    Patient was given perioperative antibiotics: Anti-infectives    Start     Dose/Rate Route Frequency Ordered Stop   11/04/15 1530  ceFAZolin (ANCEF) IVPB 2g/100 mL premix     2 g 200 mL/hr over 30 Minutes Intravenous Every 6 hours 11/04/15 1503 11/04/15 2200   11/04/15 0900  ceFAZolin (ANCEF) IVPB 2g/100 mL premix     2 g 200 mL/hr over 30 Minutes Intravenous To ShortStay Surgical 11/03/15 1125 11/04/15 0953       Patient was given sequential compression devices, early ambulation, and chemoprophylaxis to prevent DVT.  Patient benefited maximally from hospital stay and there were no complications.    Recent vital signs: Patient Vitals for the past 24 hrs:  BP Temp Temp src Pulse Resp SpO2 Height  11/06/15 0500 (!) 136/50 98.6 F (37 C) Oral 62 18 98 % -  11/05/15 2131 (!) 143/54 99.7 F (37.6 C) Oral 81 16 100 % -   11/05/15 1918 - - - - - - 5\' 4"  (1.626 m)     Recent laboratory studies: No results for input(s): WBC, HGB, HCT, PLT, NA, K, CL, CO2, BUN, CREATININE, GLUCOSE, INR, CALCIUM in the last 72 hours.  Invalid input(s): PT, 2   Discharge Medications:     Medication List    TAKE these medications   aspirin 325 MG EC tablet Take 1 tablet (325 mg total) by mouth 2 (two) times daily after a meal.   cyanocobalamin 500 MCG tablet Take 500 mcg by mouth daily.   GLUCOSAMINE-CHONDROITIN-VIT D3 PO Take 1 tablet by mouth daily.   HYDROcodone-acetaminophen 5-325 MG tablet Commonly known as:  NORCO/VICODIN Take 1-2 tablets by mouth every 4 (four) hours as needed (breakthrough pain).   KLOR-CON 10 10 MEQ tablet Generic drug:  potassium chloride Take 10 mEq by mouth 2 (two) times daily.   losartan 100 MG tablet Commonly known as:  COZAAR Take 100 mg by mouth daily.   methocarbamol 500 MG tablet Commonly known as:  ROBAXIN Take 1 tablet (500 mg total) by mouth every 6 (six) hours as needed for muscle spasms.   omeprazole 20 MG capsule Commonly known as:  PRILOSEC Take 20 mg by mouth daily.   traMADol 50 MG tablet Commonly known as:  ULTRAM Take 1 tablet (50 mg total) by mouth every 6 (six) hours as needed. pain   vitamin C  500 MG tablet Commonly known as:  ASCORBIC ACID Take 500 mg by mouth daily.   Vitamin D-3 1000 units Caps Take 1 capsule by mouth daily.       Diagnostic Studies: No results found.  Disposition: 01-Home or Self Care  Discharge Instructions    Call MD / Call 911    Complete by:  As directed   If you experience chest pain or shortness of breath, CALL 911 and be transported to the hospital emergency room.  If you develope a fever above 101 F, pus (white drainage) or increased drainage or redness at the wound, or calf pain, call your surgeon's office.   Constipation Prevention    Complete by:  As directed   Drink plenty of fluids.  Prune juice may be  helpful.  You may use a stool softener, such as Colace (over the counter) 100 mg twice a day.  Use MiraLax (over the counter) for constipation as needed.   Diet - low sodium heart healthy    Complete by:  As directed   Discharge instructions    Complete by:  As directed   INSTRUCTIONS AFTER JOINT REPLACEMENT   Remove items at home which could result in a fall. This includes throw rugs or furniture in walking pathways ICE to the affected joint every three hours while awake for 30 minutes at a time, for at least the first 3-5 days, and then as needed for pain and swelling.  Continue to use ice for pain and swelling. You may notice swelling that will progress down to the foot and ankle.  This is normal after surgery.  Elevate your leg when you are not up walking on it.   Continue to use the breathing machine you got in the hospital (incentive spirometer) which will help keep your temperature down.  It is common for your temperature to cycle up and down following surgery, especially at night when you are not up moving around and exerting yourself.  The breathing machine keeps your lungs expanded and your temperature down.   DIET:  As you were doing prior to hospitalization, we recommend a well-balanced diet.  DRESSING / WOUND CARE / SHOWERING  You may shower 3 days after surgery, but keep the wounds dry during showering.  You may use an occlusive plastic wrap (Press'n Seal for example), NO SOAKING/SUBMERGING IN THE BATHTUB.  If the bandage gets wet, change with a clean dry gauze.  If the incision gets wet, pat the wound dry with a clean towel.  ACTIVITY  Increase activity slowly as tolerated, but follow the weight bearing instructions below.   No driving for 6 weeks or until further direction given by your physician.  You cannot drive while taking narcotics.  No lifting or carrying greater than 10 lbs. until further directed by your surgeon. Avoid periods of inactivity such as sitting longer than an  hour when not asleep. This helps prevent blood clots.  You may return to work once you are authorized by your doctor.     WEIGHT BEARING   Weight bearing as tolerated with assist device (walker, cane, etc) as directed, use it as long as suggested by your surgeon or therapist, typically at least 4-6 weeks.   EXERCISES  Results after joint replacement surgery are often greatly improved when you follow the exercise, range of motion and muscle strengthening exercises prescribed by your doctor. Safety measures are also important to protect the joint from further injury. Any time any of these  exercises cause you to have increased pain or swelling, decrease what you are doing until you are comfortable again and then slowly increase them. If you have problems or questions, call your caregiver or physical therapist for advice.   Rehabilitation is important following a joint replacement. After just a few days of immobilization, the muscles of the leg can become weakened and shrink (atrophy).  These exercises are designed to build up the tone and strength of the thigh and leg muscles and to improve motion. Often times heat used for twenty to thirty minutes before working out will loosen up your tissues and help with improving the range of motion but do not use heat for the first two weeks following surgery (sometimes heat can increase post-operative swelling).   These exercises can be done on a training (exercise) mat, on the floor, on a table or on a bed. Use whatever works the best and is most comfortable for you.    Use music or television while you are exercising so that the exercises are a pleasant break in your day. This will make your life better with the exercises acting as a break in your routine that you can look forward to.   Perform all exercises about fifteen times, three times per day or as directed.  You should exercise both the operative leg and the other leg as well.   Exercises include:    Quad Sets - Tighten up the muscle on the front of the thigh (Quad) and hold for 5-10 seconds.   Straight Leg Raises - With your knee straight (if you were given a brace, keep it on), lift the leg to 60 degrees, hold for 3 seconds, and slowly lower the leg.  Perform this exercise against resistance later as your leg gets stronger.  Leg Slides: Lying on your back, slowly slide your foot toward your buttocks, bending your knee up off the floor (only go as far as is comfortable). Then slowly slide your foot back down until your leg is flat on the floor again.  Angel Wings: Lying on your back spread your legs to the side as far apart as you can without causing discomfort.  Hamstring Strength:  Lying on your back, push your heel against the floor with your leg straight by tightening up the muscles of your buttocks.  Repeat, but this time bend your knee to a comfortable angle, and push your heel against the floor.  You may put a pillow under the heel to make it more comfortable if necessary.   A rehabilitation program following joint replacement surgery can speed recovery and prevent re-injury in the future due to weakened muscles. Contact your doctor or a physical therapist for more information on knee rehabilitation.    CONSTIPATION  Constipation is defined medically as fewer than three stools per week and severe constipation as less than one stool per week.  Even if you have a regular bowel pattern at home, your normal regimen is likely to be disrupted due to multiple reasons following surgery.  Combination of anesthesia, postoperative narcotics, change in appetite and fluid intake all can affect your bowels.   YOU MUST use at least one of the following options; they are listed in order of increasing strength to get the job done.  They are all available over the counter, and you may need to use some, POSSIBLY even all of these options:    Drink plenty of fluids (prune juice may be helpful) and high  fiber  foods Colace 100 mg by mouth twice a day  Senokot for constipation as directed and as needed Dulcolax (bisacodyl), take with full glass of water  Miralax (polyethylene glycol) once or twice a day as needed.  If you have tried all these things and are unable to have a bowel movement in the first 3-4 days after surgery call either your surgeon or your primary doctor.    If you experience loose stools or diarrhea, hold the medications until you stool forms back up.  If your symptoms do not get better within 1 week or if they get worse, check with your doctor.  If you experience "the worst abdominal pain ever" or develop nausea or vomiting, please contact the office immediately for further recommendations for treatment.   ITCHING:  If you experience itching with your medications, try taking only a single pain pill, or even half a pain pill at a time.  You can also use Benadryl over the counter for itching or also to help with sleep.   TED HOSE STOCKINGS:  Use stockings on both legs until for at least 2 weeks or as directed by physician office. They may be removed at night for sleeping.  MEDICATIONS:  See your medication summary on the "After Visit Summary" that nursing will review with you.  You may have some home medications which will be placed on hold until you complete the course of blood thinner medication.  It is important for you to complete the blood thinner medication as prescribed.  PRECAUTIONS:  If you experience chest pain or shortness of breath - call 911 immediately for transfer to the hospital emergency department.   If you develop a fever greater that 101 F, purulent drainage from wound, increased redness or drainage from wound, foul odor from the wound/dressing, or calf pain - CONTACT YOUR SURGEON.                                                   FOLLOW-UP APPOINTMENTS:  If you do not already have a post-op appointment, please call the office for an appointment to be seen by  your surgeon.  Guidelines for how soon to be seen are listed in your "After Visit Summary", but are typically between 1-4 weeks after surgery.  OTHER INSTRUCTIONS:   Knee Replacement:  Do not place pillow under knee, focus on keeping the knee straight while resting. CPM instructions: 0-90 degrees, 2 hours in the morning, 2 hours in the afternoon, and 2 hours in the evening. Place foam block, curve side up under heel at all times except when in CPM or when walking.  DO NOT modify, tear, cut, or change the foam block in any way.  MAKE SURE YOU:  Understand these instructions.  Get help right away if you are not doing well or get worse.    Thank you for letting us be a part of your medical care team.  It is a privilege we respect greatly.  We hope these instructions will help you stay on track for a fast and full recovery!   Increase activity slowly as tolerated    Complete by:  As directed       Contact information for follow-up providers    DALLDORF,PETER G, MD. Schedule an appointment as soon as possible for a visit in 2 weeks.   Specialty:  Orthopedic Surgery Contact information: [place] [address] [phone]            Contact information for after-discharge care    Destination    [facility] .   Specialty:  Skilled Nursing Facility Contact information: [place] [phone]                   Signed: Rich Fuchs 11/06/2015, 2:12 PM

## 2015-11-06 NOTE — Progress Notes (Signed)
Report to Conservation officer, historic buildings at [facility], Farley Ly RN

## 2015-11-06 NOTE — Progress Notes (Signed)
Physical Therapy Treatment Patient Details Name: Sheila Gardner MRN: [id number] DOB: [birth year] Today's Date: 11/06/2015    History of Present Illness Patient is a 80 y/o female with hx of Rt THA, HTN, HLD, breast ca presents s/p left TKA.    PT Comments    Pt progressed gait distance and is progressing well with therapy at this time.  Pt has safety issues that remain and would continue to benefit from Short Term SNF placement to improve strength and functional mobility before returning home.    Follow Up Recommendations  SNF     Equipment Recommendations  None recommended by PT    Recommendations for Other Services       Precautions / Restrictions Precautions Precautions: Knee Precaution Booklet Issued: No Precaution Comments: Reviewed no pillow under knee and precautions. Restrictions Weight Bearing Restrictions: Yes LLE Weight Bearing: Weight bearing as tolerated    Mobility  Bed Mobility Overal bed mobility: Needs Assistance Bed Mobility: Sit to Supine       Sit to supine: Supervision   General bed mobility comments: Pt performed without assisted.  Moaning in pain but quick to transition back to bed.    Transfers Overall transfer level: Needs assistance Equipment used: Rolling walker (2 wheeled) Transfers: Sit to/from Stand Sit to Stand: Supervision         General transfer comment: Cues for hand placement to push from seated surface.  Pt demonstrated improved eccentric loading from stand to sit.    Ambulation/Gait Ambulation/Gait assistance: Min guard Ambulation Distance (Feet): 84 Feet Assistive device: Rolling walker (2 wheeled) Gait Pattern/deviations: Step-through pattern;Shuffle;Trunk flexed;Decreased stride length Gait velocity: decreased Gait velocity interpretation: Below normal speed for age/gender General Gait Details: Cues for upper trunk control, RW safety, backing and progression to step through pattern.     Stairs             Wheelchair Mobility    Modified Rankin (Stroke Patients Only)       Balance Overall balance assessment: Needs assistance   Sitting balance-Leahy Scale: Good       Standing balance-Leahy Scale: Fair                      Cognition Arousal/Alertness: Awake/alert Behavior During Therapy: WFL for tasks assessed/performed Overall Cognitive Status: Within Functional Limits for tasks assessed                      Exercises Total Joint Exercises Ankle Circles/Pumps: AROM;Both;10 reps;Supine Quad Sets: AROM;Left;10 reps;Supine Short Arc Quad: AROM;Left;10 reps;Supine Heel Slides: AAROM;Left;10 reps;Supine Hip ABduction/ADduction: AAROM;Left;10 reps;Supine Straight Leg Raises: AAROM;Left;10 reps;Supine Goniometric ROM: 8-78 degrees L knee.      General Comments        Pertinent Vitals/Pain Pain Assessment: 0-10 Pain Score: 6  Pain Descriptors / Indicators: Sore;Tightness;Grimacing;Guarding Pain Intervention(s): Limited activity within patient's tolerance;Repositioned    Home Living                      Prior Function            PT Goals (current goals can now be found in the care plan section) Acute Rehab PT Goals Patient Stated Goal: to be able to play with my grandchildren Potential to Achieve Goals: Good Progress towards PT goals: Progressing toward goals    Frequency  7X/week    PT Plan Current plan remains appropriate    Co-evaluation  End of Session Equipment Utilized During Treatment: Gait belt Activity Tolerance: Patient limited by pain Patient left: in bed;with call bell/phone within reach     Time: 1221-1237 PT Time Calculation (min) (ACUTE ONLY): 16 min  Charges:  $Gait Training: 8-22 mins                    G Codes:      Sheila Gardner 11/26/2015, 12:45 PM  Sheila Gardner, PTA pager [phone]

## 2015-11-06 NOTE — Clinical Social Work Note (Signed)
Patient will discharge today per MD order. Patient will discharge to: [facility] RN to call report prior to transportation to: [phone] Transportation: [facility]  CSW sent discharge summary to SNF for review.    Nonnie Done, LCSW ([id number]) [id number]  Licensed Clinical Social Worker

## 2015-11-06 NOTE — Clinical Social Work Note (Signed)
Clinical Social Work Assessment  Patient Details  Name: Sheila Gardner MRN: [id number] Date of Birth: [birth year]  Date of referral:  11/06/15               Reason for consult:  Facility Placement                Permission sought to share information with:  Facility Sport and exercise psychologist, Case Optician, dispensing granted to share information::  Yes, Verbal Permission Granted  Name::        Agency::   ([facility])  Relationship::     Contact Information:     Housing/Transportation Living arrangements for the past 2 months:  Single Family Home Source of Information:  Patient Patient Interpreter Needed:  None Criminal Activity/Legal Involvement Pertinent to Current Situation/Hospitalization:  No - Comment as needed Significant Relationships:  None Lives with:  Self Do you feel safe going back to the place where you live?  No Need for family participation in patient care:  No (Coment)  Care giving concerns:  none   Facilities manager / plan:  CSW discussed discharge plans with patient.  Of note, patient is a bundled patient of Dr. Jerald Kief office and is pre-registered at [facility] and will be discharged today.  Patient will need [facility] transportation.  This information was given by MD [facility] and verified by this CSW with patient at bedside.  Employment status:  Retired Forensic scientist:  Medicare ([employer] [employer] patient) PT Recommendations:  [facility] / Referral to community resources:  [place]  Patient/Family's Response to care:  agreeable  Patient/Family's Understanding of and Emotional Response to Diagnosis, Current Treatment, and Prognosis:  Patient expressed feeling well prepared for the surgery and the recovery at SNF.  Patient is concerned about the care at [facility].  CSW offered support during this time of transition.  Patient was appreciative.  Emotional Assessment Appearance:  Appears stated age Attitude/Demeanor/Rapport:     Affect (typically observed):  Accepting, Adaptable Orientation:  Oriented to Self, Oriented to Place, Oriented to  Time, Oriented to Situation Alcohol / Substance use:  Not Applicable Psych involvement (Current and /or in the community):  No (Comment)  Discharge Needs  Concerns to be addressed:  No discharge needs identified Readmission within the last 30 days:  No Current discharge risk:  None Barriers to Discharge:  Continued Medical Work up   [employer], LCSW 11/06/2015, 11:41 AM

## 2015-11-07 ENCOUNTER — Non-Acute Institutional Stay (SKILLED_NURSING_FACILITY): Payer: Medicare Other | Admitting: Internal Medicine

## 2015-11-07 ENCOUNTER — Encounter: Payer: Self-pay | Admitting: Internal Medicine

## 2015-11-07 DIAGNOSIS — R2681 Unsteadiness on feet: Secondary | ICD-10-CM | POA: Diagnosis not present

## 2015-11-07 DIAGNOSIS — I10 Essential (primary) hypertension: Secondary | ICD-10-CM | POA: Diagnosis not present

## 2015-11-07 DIAGNOSIS — K5901 Slow transit constipation: Secondary | ICD-10-CM | POA: Diagnosis not present

## 2015-11-07 DIAGNOSIS — K219 Gastro-esophageal reflux disease without esophagitis: Secondary | ICD-10-CM | POA: Diagnosis not present

## 2015-11-07 DIAGNOSIS — E876 Hypokalemia: Secondary | ICD-10-CM | POA: Diagnosis not present

## 2015-11-07 DIAGNOSIS — M1712 Unilateral primary osteoarthritis, left knee: Secondary | ICD-10-CM | POA: Diagnosis not present

## 2015-11-07 NOTE — Progress Notes (Signed)
LOCATION: [facility]  PCP:  Melinda Crutch, MD   Code Status: Full Code  Goals of care: Advanced Directive information Advanced Directives 11/04/2015  Does patient have an advance directive? No  Would patient like information on creating an advanced directive? No - patient declined information       Extended Emergency Contact Information Primary Emergency Contact: Spivey,Sonya Address: [place], [place] Phone: [phone] Relation: Daughter Secondary Emergency Contact: [facility] Address: [address]          [place], [place] [address] Johnnette Litter of [place] Phone: [phone] Relation: Son   Allergies  Allergen Reactions  . No Known Allergies     Chief Complaint  Patient presents with  . New Admit To SNF    New Admission     HPI:  Patient is a 80 y.o. female seen today for short term rehabilitation post hospital admission from 11/04/15-11/06/15 with left knee OA. She underwent left total knee arthroplasty. She is seen in her room today.   Review of Systems:  Constitutional: Negative for fever, chills, diaphoresis. Energy level is fair. HENT: Negative for headache, congestion, nasal discharge Eyes: Negative for blurred vision, double vision and discharge. Wears glasses. Respiratory: Negative for cough, shortness of breath and wheezing.   Cardiovascular: Negative for chest pain, palpitations, leg swelling.  Gastrointestinal: Negative for heartburn, nausea, vomiting, abdominal pain. Last bowel movement was prior to surgery.   Genitourinary: Negative for dysuria and flank pain.  Musculoskeletal: Negative for back pain, fall in the facility.  Skin: Negative for itching, rash.  Neurological: Negative for dizziness. Psychiatric/Behavioral: Negative for depression   Past Medical History:  Diagnosis Date  . Arthritis   . Breast cancer ([place])   . Cancer ([place])    Breast- rt  . Cough with sputum 2016  .  GERD (gastroesophageal reflux disease)    COSTOCHONDRITIS  . Heart murmur   . Hyperlipidemia   . Hypertension   . Lipoma    back of neck  . PONV (postoperative nausea and vomiting)    only after rt hip surgery   Past Surgical History:  Procedure Laterality Date  . ANKLE FRACTURE SURGERY Right 2013   RIGHT   . BREAST SURGERY  04/22/2009   Rt lumpectomy  . EYE SURGERY  2001   macular hole  . JOINT REPLACEMENT  2016  . MIDDLE EAR SURGERY Left 08/18/2015   repair eardrum and reconstruction  . OVARY SURGERY  40 years ago   wedge  . TOTAL HIP ARTHROPLASTY Right 07/30/2014   Procedure: TOTAL HIP ARTHROPLASTY ANTERIOR APPROACH;  Surgeon: Melrose Nakayama, MD;  Location: [facility];  Service: Orthopedics;  Laterality: Right;  . TOTAL KNEE ARTHROPLASTY Left 11/04/2015  . TOTAL KNEE ARTHROPLASTY Left 11/04/2015   Procedure: TOTAL KNEE ARTHROPLASTY;  Surgeon: Melrose Nakayama, MD;  Location: [facility];  Service: Orthopedics;  Laterality: Left;  . TYMPANOPLASTY  30 years ago   Social History:   reports that she has never smoked. She has never used smokeless tobacco. She reports that she drinks alcohol. She reports that she does not use drugs.  Family History  Problem Relation Age of Onset  . Cancer Brother     colon, pancreatic, brain    Medications:   Medication List       Accurate as of 11/07/15  1:11 PM. Always use your most recent med list.  aspirin 325 MG EC tablet Take 1 tablet (325 mg total) by mouth 2 (two) times daily after a meal.   cyanocobalamin 500 MCG tablet Take 500 mcg by mouth daily.   GLUCOSAMINE-CHONDROITIN-VIT D3 PO Take 1 tablet by mouth daily.   HYDROcodone-acetaminophen 5-325 MG tablet Commonly known as:  NORCO/VICODIN Take 1-2 tablets by mouth every 4 (four) hours as needed (breakthrough pain).   KLOR-CON 10 10 MEQ tablet Generic drug:  potassium chloride Take 10 mEq by mouth 2 (two) times daily.   losartan 100 MG tablet Commonly known as:   COZAAR Take 100 mg by mouth daily.   methocarbamol 500 MG tablet Commonly known as:  ROBAXIN Take 1 tablet (500 mg total) by mouth every 6 (six) hours as needed for muscle spasms.   omeprazole 20 MG capsule Commonly known as:  PRILOSEC Take 20 mg by mouth daily.   traMADol 50 MG tablet Commonly known as:  ULTRAM Take 1 tablet (50 mg total) by mouth every 6 (six) hours as needed. pain   vitamin C 500 MG tablet Commonly known as:  ASCORBIC ACID Take 500 mg by mouth daily.   Vitamin D-3 1000 units Caps Take 1 capsule by mouth daily.       Immunizations: Immunization History  Administered Date(s) Administered  . Influenza-Unspecified 11/21/2013  . PPD Test 08/01/2014     Physical Exam:  Vitals:   11/07/15 1306  BP: 137/76  Pulse: 72  Resp: 20  Temp: 99 F (37.2 C)  TempSrc: Oral  SpO2: 97%  Weight: 183 lb 3.2 oz (83.1 kg)  Height: 5\' 4"  (1.626 m)   Body mass index is 31.45 kg/m.  General- elderly female, obese, in no acute distress Head- normocephalic, atraumatic Nose- no maxillary or frontal sinus tenderness, no nasal discharge Throat- moist mucus membrane Eyes- PERRLA, EOMI, no pallor, no icterus, no discharge, normal conjunctiva, normal sclera Neck- no cervical lymphadenopathy Cardiovascular- normal s1,s2, no murmur, trace leg edema Respiratory- bilateral clear to auscultation, no wheeze, no rhonchi, no crackles, no use of accessory muscles Abdomen- bowel sounds present, soft, non tender Musculoskeletal- able to move all 4 extremities, limited left knee ROM Neurological- alert and oriented to person, place and time Skin- warm and dry, left knee surgical incision with aquacel dressing Psychiatry- normal mood and affect    Labs reviewed: Basic Metabolic Panel:  Recent Labs  05/12/15 1250 10/24/15 1401  NA 140 138  K 3.8 3.6  CL  --  101  CO2 25 29  GLUCOSE 107 113*  BUN 8.4 14  CREATININE 0.7 0.57  CALCIUM 9.8 10.1   Liver Function  Tests:  Recent Labs  05/12/15 1250  AST 22  ALT 19  ALKPHOS 66  BILITOT 0.91  PROT 8.1  ALBUMIN 4.1   No results for input(s): LIPASE, AMYLASE in the last 8760 hours. No results for input(s): AMMONIA in the last 8760 hours. CBC:  Recent Labs  05/12/15 1250 10/24/15 1401  WBC 9.9 7.7  NEUTROABS 5.1 3.7  HGB 12.9 12.6  HCT 38.9 38.8  MCV 90.2 90.2  PLT 311 287   Cardiac Enzymes: No results for input(s): CKTOTAL, CKMB, CKMBINDEX, TROPONINI in the last 8760 hours. BNP: Invalid input(s): POCBNP CBG: No results for input(s): GLUCAP in the last 8760 hours.  Radiological Exams: No results found.  Assessment/Plan  Unsteady gait Will have patient work with PT/OT as tolerated to regain strength and restore function.  Fall precautions are in place.  Left knee OA S/p  left total knee arthroplasty. Has orthopedic follow up. Continue tramadol 50 mg q6h prn pain with norco 5-325 mg 1-2 tab q4h prn pain. Continue robaxin 500 mg q6h prn muscle spasm. Continue aspirin 325 mg bid for dvt [prophylaixs. Will have her work with physical therapy and occupational therapy team to help with gait training and muscle strengthening exercises.fall precautions. Skin care. Encourage to be out of bed. Continue vitamin d supplement  Constipation Add senna s 2 tab qhs and miralax daily, hydration encouraged, monitor  gerd Stable, continue priloec 20 mg daily and monitor  HTN Monitor bp and bmp, continue losartan 100 mg daily and monitor  Hypokalemia Continue kcl and check bmp     Goals of care: short term rehabilitation   Labs/tests ordered: cbc, bmp  Family/ staff Communication: reviewed care plan with patient and nursing supervisor    Blanchie Serve, MD Internal Medicine [place], [place] [address] Cell Phone (Monday-Friday 8 am - 5 pm): [phone] On Call: [phone] and follow prompts after 5 pm and on  weekends Office Phone: [phone] Office Fax: [phone]

## 2015-11-11 ENCOUNTER — Encounter: Payer: Self-pay | Admitting: Adult Health

## 2015-11-11 ENCOUNTER — Non-Acute Institutional Stay (SKILLED_NURSING_FACILITY): Payer: Medicare Other | Admitting: Adult Health

## 2015-11-11 DIAGNOSIS — I10 Essential (primary) hypertension: Secondary | ICD-10-CM

## 2015-11-11 DIAGNOSIS — E559 Vitamin D deficiency, unspecified: Secondary | ICD-10-CM | POA: Diagnosis not present

## 2015-11-11 DIAGNOSIS — M1712 Unilateral primary osteoarthritis, left knee: Secondary | ICD-10-CM | POA: Diagnosis not present

## 2015-11-11 DIAGNOSIS — K219 Gastro-esophageal reflux disease without esophagitis: Secondary | ICD-10-CM | POA: Diagnosis not present

## 2015-11-11 DIAGNOSIS — R2681 Unsteadiness on feet: Secondary | ICD-10-CM

## 2015-11-11 DIAGNOSIS — E876 Hypokalemia: Secondary | ICD-10-CM | POA: Diagnosis not present

## 2015-11-11 DIAGNOSIS — K5901 Slow transit constipation: Secondary | ICD-10-CM

## 2015-11-11 DIAGNOSIS — D62 Acute posthemorrhagic anemia: Secondary | ICD-10-CM | POA: Diagnosis not present

## 2015-11-11 NOTE — Progress Notes (Signed)
Patient ID: Sheila Gardner, female   DOB: 12/14/1935, 80 y.o.   MRN: [id number]    DATE:  11/11/15  MRN:  [id number]  BIRTHDAY: May 09, 1935  Facility:  [facility] Location:  [place] [facility] [place] Room Number: [id number]  LEVEL OF CARE:  SNF (31)  Contact Information    Name Relation Home Work [place] Daughter [phone]     Lalia, Johnston [phone]     Ivanelle, [place]   [phone]       Code Status History    Date Active Date Inactive Code Status Order ID Comments User Context   11/04/2015  3:03 PM 11/06/2015 10:34 PM Full Code [id number]  Macie Burows Inpatient   07/30/2014  3:59 PM 08/01/2014  7:59 PM Full Code [id number]  Loni Dolly, PA-C Inpatient       Chief Complaint  Patient presents with  . Discharge Note    HISTORY OF PRESENT ILLNESS:  This is a 80 year old female who is for discharge home and will have outpatient care.  She has been admitted to [facility] on 11/06/15 from [facility] with Left knee osteoarthritis for which she had left total knee arthroplasty.  Patient was admitted to this facility for short-term rehabilitation after the patient's recent hospitalization.  Patient has completed SNF rehabilitation and therapy has cleared the patient for discharge.   PAST MEDICAL HISTORY:  Past Medical History:  Diagnosis Date  . Arthritis   . Breast cancer ([place])   . Cancer ([place])    Breast- rt  . Cough with sputum 2016  . GERD (gastroesophageal reflux disease)    COSTOCHONDRITIS  . Heart murmur   . Hyperlipidemia   . Hypertension   . Lipoma    back of neck  . PONV (postoperative nausea and vomiting)    only after rt hip surgery     CURRENT MEDICATIONS: Reviewed  Patient's Medications  New Prescriptions   No medications on file  Previous Medications   ASPIRIN EC 325 MG EC TABLET    Take 1 tablet (325 mg total) by mouth 2 (two) times daily after a meal.   CHOLECALCIFEROL (VITAMIN D-3) 1000  UNITS CAPS    Take 1 capsule by mouth daily.   CYANOCOBALAMIN 500 MCG TABLET    Take 500 mcg by mouth daily.   GLUCOSAMINE-CHONDROITIN-VIT D3 PO    Take 1 tablet by mouth daily.   HYDROCODONE-ACETAMINOPHEN (NORCO/VICODIN) 5-325 MG TABLET    Take 1-2 tablets by mouth every 4 (four) hours as needed (breakthrough pain).   KLOR-CON 10 10 MEQ TABLET    Take 10 mEq by mouth 2 (two) times daily.   LOSARTAN (COZAAR) 100 MG TABLET    Take 100 mg by mouth daily.    METHOCARBAMOL (ROBAXIN) 500 MG TABLET    Take 1 tablet (500 mg total) by mouth every 6 (six) hours as needed for muscle spasms.   OMEPRAZOLE (PRILOSEC) 20 MG CAPSULE    Take 20 mg by mouth daily.    POLYETHYLENE GLYCOL (MIRALAX / GLYCOLAX) PACKET    Take 17 g by mouth daily.   SENNOSIDES-DOCUSATE SODIUM (SENOKOT-S) 8.6-50 MG TABLET    Take 2 tablets by mouth at bedtime.   TRAMADOL (ULTRAM) 50 MG TABLET    Take 1 tablet (50 mg total) by mouth every 6 (six) hours as needed. pain   VITAMIN C (ASCORBIC ACID) 500 MG TABLET    Take 500 mg by mouth daily.  Modified  Medications   No medications on file  Discontinued Medications   No medications on file     Allergies  Allergen Reactions  . No Known Allergies      REVIEW OF SYSTEMS:  GENERAL: no change in appetite, no fatigue, no weight changes, no fever, chills or weakness EYES: Denies change in vision, dry eyes, eye pain, itching or discharge EARS: Denies change in hearing, ringing in ears, or earache NOSE: Denies nasal congestion or epistaxis MOUTH and THROAT: Denies oral discomfort, gingival pain or bleeding, pain from teeth or hoarseness   RESPIRATORY: no cough, SOB, DOE, wheezing, hemoptysis CARDIAC: no chest pain, edema or palpitations GI: no abdominal pain, diarrhea, constipation, heart burn, nausea or vomiting GU: Denies dysuria, frequency, hematuria, incontinence, or discharge PSYCHIATRIC: Denies feeling of depression or anxiety. No report of hallucinations, insomnia, paranoia,  or agitation    PHYSICAL EXAMINATION  GENERAL APPEARANCE: Well nourished. In no acute distress. Obese SKIN:  Left knee surgical incision is dry, no erythema HEAD: Normal in size and contour. No evidence of trauma EYES: Lids open and close normally. No blepharitis, entropion or ectropion. PERRL. Conjunctivae are clear and sclerae are white. Lenses are without opacity EARS: Pinnae are normal. Patient hears normal voice tunes of the examiner MOUTH and THROAT: Lips are without lesions. Oral mucosa is moist and without lesions. Tongue is normal in shape, size, and color and without lesions NECK: supple, trachea midline, no neck masses, no thyroid tenderness, no thyromegaly LYMPHATICS: no LAN in the neck, no supraclavicular LAN RESPIRATORY: breathing is even & unlabored, BS CTAB CARDIAC: RRR, no murmur,no extra heart sounds, no edema GI: abdomen soft, normal BS, no masses, no tenderness, no hepatomegaly, no splenomegaly EXTREMITIES:  Able to move X 4 extremities PSYCHIATRIC: Alert and oriented X 3. Affect and behavior are appropriate  LABS/RADIOLOGY: Labs reviewed: 11/11/15   Wbc 7.4  hgb 9.8  hct 31.2  mcv 96.9  Platelet 326 NA 139  K 4.9  Glucose 136  BUN 11  Creatinine 0.55  GFR [id number] Basic Metabolic Panel:  Recent Labs  05/12/15 1250 10/24/15 1401  NA 140 138  K 3.8 3.6  CL  --  101  CO2 25 29  GLUCOSE 107 113*  BUN 8.4 14  CREATININE 0.7 0.57  CALCIUM 9.8 10.1   Liver Function Tests:  Recent Labs  05/12/15 1250  AST 22  ALT 19  ALKPHOS 66  BILITOT 0.91  PROT 8.1  ALBUMIN 4.1   CBC:  Recent Labs  05/12/15 1250 10/24/15 1401  WBC 9.9 7.7  NEUTROABS 5.1 3.7  HGB 12.9 12.6  HCT 38.9 38.8  MCV 90.2 90.2  PLT 311 287     ASSESSMENT/PLAN:   Unsteady gait - for outpatient rehabilitation; fall precaution  Left knee osteoarthritis S/P left total knee arthroplasty - for outpatient rehabilitation; continue ASA 325 mg EC 1 tab PO BID for DVT prophylaxis; Norco  5/325 mg 1-2 tabs PO Q 4 hours PRN and Tramadol 50 mg 1 tab PO Q 6 hours PRN for pain; Robaxin 500 mg 1 tab PO Q 6 hours PRN for muscle spasm; follow-up with orthopedic surgeon  Hypokalemia - continue KCL ER 10 meq 1 tab PO daily Lab Results  Component Value Date   K 3.6 10/24/2015   Hypertension - well-controlled; continue Cozaar 100 mg 1 tab PO daily  Vitamin D deficiency - continue Vitamin D3 1,000 units 1 tab PO daily  Constipation - continue Miralax 17 gm PO daily and  Senna-S 2 tabs PO Q HS  GERD - continue Prilosec 20 mg PO daily  Acute blood loss anemia - hgb 9.8; stable      I have filled out patient's discharge paperwork and written prescriptions.  Patient will have outpatient care.  DME provided:  None  Total discharge time: Less than 30 minutes  Discharge time involved coordination of the discharge process with social worker, nursing staff and therapy department.     Durenda Age, NP [employer] [phone]

## 2015-11-14 DIAGNOSIS — Z96652 Presence of left artificial knee joint: Secondary | ICD-10-CM | POA: Diagnosis not present

## 2015-11-14 DIAGNOSIS — M25662 Stiffness of left knee, not elsewhere classified: Secondary | ICD-10-CM | POA: Diagnosis not present

## 2015-11-14 DIAGNOSIS — M25562 Pain in left knee: Secondary | ICD-10-CM | POA: Diagnosis not present

## 2015-11-14 DIAGNOSIS — M1712 Unilateral primary osteoarthritis, left knee: Secondary | ICD-10-CM | POA: Diagnosis not present

## 2015-11-18 DIAGNOSIS — Z96652 Presence of left artificial knee joint: Secondary | ICD-10-CM | POA: Diagnosis not present

## 2015-11-18 DIAGNOSIS — M25562 Pain in left knee: Secondary | ICD-10-CM | POA: Diagnosis not present

## 2015-11-18 DIAGNOSIS — M25662 Stiffness of left knee, not elsewhere classified: Secondary | ICD-10-CM | POA: Diagnosis not present

## 2015-11-21 DIAGNOSIS — M25562 Pain in left knee: Secondary | ICD-10-CM | POA: Diagnosis not present

## 2015-11-21 DIAGNOSIS — M25662 Stiffness of left knee, not elsewhere classified: Secondary | ICD-10-CM | POA: Diagnosis not present

## 2015-11-21 DIAGNOSIS — Z96652 Presence of left artificial knee joint: Secondary | ICD-10-CM | POA: Diagnosis not present

## 2015-11-24 DIAGNOSIS — M25562 Pain in left knee: Secondary | ICD-10-CM | POA: Diagnosis not present

## 2015-11-25 DIAGNOSIS — M25662 Stiffness of left knee, not elsewhere classified: Secondary | ICD-10-CM | POA: Diagnosis not present

## 2015-11-25 DIAGNOSIS — M25562 Pain in left knee: Secondary | ICD-10-CM | POA: Diagnosis not present

## 2015-11-25 DIAGNOSIS — Z96652 Presence of left artificial knee joint: Secondary | ICD-10-CM | POA: Diagnosis not present

## 2015-11-28 DIAGNOSIS — M25562 Pain in left knee: Secondary | ICD-10-CM | POA: Diagnosis not present

## 2015-11-28 DIAGNOSIS — M25662 Stiffness of left knee, not elsewhere classified: Secondary | ICD-10-CM | POA: Diagnosis not present

## 2015-11-28 DIAGNOSIS — Z96652 Presence of left artificial knee joint: Secondary | ICD-10-CM | POA: Diagnosis not present

## 2015-12-02 DIAGNOSIS — Z96652 Presence of left artificial knee joint: Secondary | ICD-10-CM | POA: Diagnosis not present

## 2015-12-02 DIAGNOSIS — M25562 Pain in left knee: Secondary | ICD-10-CM | POA: Diagnosis not present

## 2015-12-02 DIAGNOSIS — M25662 Stiffness of left knee, not elsewhere classified: Secondary | ICD-10-CM | POA: Diagnosis not present

## 2015-12-04 DIAGNOSIS — M25562 Pain in left knee: Secondary | ICD-10-CM | POA: Diagnosis not present

## 2015-12-04 DIAGNOSIS — M25662 Stiffness of left knee, not elsewhere classified: Secondary | ICD-10-CM | POA: Diagnosis not present

## 2015-12-04 DIAGNOSIS — Z96652 Presence of left artificial knee joint: Secondary | ICD-10-CM | POA: Diagnosis not present

## 2015-12-09 DIAGNOSIS — M25662 Stiffness of left knee, not elsewhere classified: Secondary | ICD-10-CM | POA: Diagnosis not present

## 2015-12-09 DIAGNOSIS — M25562 Pain in left knee: Secondary | ICD-10-CM | POA: Diagnosis not present

## 2015-12-09 DIAGNOSIS — Z96652 Presence of left artificial knee joint: Secondary | ICD-10-CM | POA: Diagnosis not present

## 2015-12-11 DIAGNOSIS — M25662 Stiffness of left knee, not elsewhere classified: Secondary | ICD-10-CM | POA: Diagnosis not present

## 2015-12-11 DIAGNOSIS — Z96652 Presence of left artificial knee joint: Secondary | ICD-10-CM | POA: Diagnosis not present

## 2015-12-11 DIAGNOSIS — M25562 Pain in left knee: Secondary | ICD-10-CM | POA: Diagnosis not present

## 2015-12-15 DIAGNOSIS — M25662 Stiffness of left knee, not elsewhere classified: Secondary | ICD-10-CM | POA: Diagnosis not present

## 2015-12-15 DIAGNOSIS — M25562 Pain in left knee: Secondary | ICD-10-CM | POA: Diagnosis not present

## 2015-12-15 DIAGNOSIS — Z96652 Presence of left artificial knee joint: Secondary | ICD-10-CM | POA: Diagnosis not present

## 2015-12-18 DIAGNOSIS — M25662 Stiffness of left knee, not elsewhere classified: Secondary | ICD-10-CM | POA: Diagnosis not present

## 2015-12-18 DIAGNOSIS — Z96652 Presence of left artificial knee joint: Secondary | ICD-10-CM | POA: Diagnosis not present

## 2015-12-18 DIAGNOSIS — M25562 Pain in left knee: Secondary | ICD-10-CM | POA: Diagnosis not present

## 2015-12-22 DIAGNOSIS — M25662 Stiffness of left knee, not elsewhere classified: Secondary | ICD-10-CM | POA: Diagnosis not present

## 2015-12-22 DIAGNOSIS — Z96652 Presence of left artificial knee joint: Secondary | ICD-10-CM | POA: Diagnosis not present

## 2015-12-22 DIAGNOSIS — M25562 Pain in left knee: Secondary | ICD-10-CM | POA: Diagnosis not present

## 2015-12-24 DIAGNOSIS — Z471 Aftercare following joint replacement surgery: Secondary | ICD-10-CM | POA: Diagnosis not present

## 2015-12-24 DIAGNOSIS — Z96652 Presence of left artificial knee joint: Secondary | ICD-10-CM | POA: Diagnosis not present

## 2015-12-24 DIAGNOSIS — M25562 Pain in left knee: Secondary | ICD-10-CM | POA: Diagnosis not present

## 2015-12-25 DIAGNOSIS — M25562 Pain in left knee: Secondary | ICD-10-CM | POA: Diagnosis not present

## 2015-12-25 DIAGNOSIS — M25662 Stiffness of left knee, not elsewhere classified: Secondary | ICD-10-CM | POA: Diagnosis not present

## 2015-12-25 DIAGNOSIS — Z96652 Presence of left artificial knee joint: Secondary | ICD-10-CM | POA: Diagnosis not present

## 2015-12-26 DIAGNOSIS — M799 Soft tissue disorder, unspecified: Secondary | ICD-10-CM | POA: Diagnosis not present

## 2015-12-29 DIAGNOSIS — M25562 Pain in left knee: Secondary | ICD-10-CM | POA: Diagnosis not present

## 2015-12-29 DIAGNOSIS — Z96652 Presence of left artificial knee joint: Secondary | ICD-10-CM | POA: Diagnosis not present

## 2015-12-29 DIAGNOSIS — M25662 Stiffness of left knee, not elsewhere classified: Secondary | ICD-10-CM | POA: Diagnosis not present

## 2015-12-31 DIAGNOSIS — Z96652 Presence of left artificial knee joint: Secondary | ICD-10-CM | POA: Diagnosis not present

## 2015-12-31 DIAGNOSIS — M25562 Pain in left knee: Secondary | ICD-10-CM | POA: Diagnosis not present

## 2015-12-31 DIAGNOSIS — M25662 Stiffness of left knee, not elsewhere classified: Secondary | ICD-10-CM | POA: Diagnosis not present

## 2016-01-02 DIAGNOSIS — M25562 Pain in left knee: Secondary | ICD-10-CM | POA: Diagnosis not present

## 2016-01-02 DIAGNOSIS — M25662 Stiffness of left knee, not elsewhere classified: Secondary | ICD-10-CM | POA: Diagnosis not present

## 2016-01-02 DIAGNOSIS — Z96652 Presence of left artificial knee joint: Secondary | ICD-10-CM | POA: Diagnosis not present

## 2016-01-06 DIAGNOSIS — Z96652 Presence of left artificial knee joint: Secondary | ICD-10-CM | POA: Diagnosis not present

## 2016-01-06 DIAGNOSIS — M25562 Pain in left knee: Secondary | ICD-10-CM | POA: Diagnosis not present

## 2016-01-06 DIAGNOSIS — M25662 Stiffness of left knee, not elsewhere classified: Secondary | ICD-10-CM | POA: Diagnosis not present

## 2016-01-08 DIAGNOSIS — Z96652 Presence of left artificial knee joint: Secondary | ICD-10-CM | POA: Diagnosis not present

## 2016-01-08 DIAGNOSIS — M25662 Stiffness of left knee, not elsewhere classified: Secondary | ICD-10-CM | POA: Diagnosis not present

## 2016-01-08 DIAGNOSIS — M25562 Pain in left knee: Secondary | ICD-10-CM | POA: Diagnosis not present

## 2016-01-13 ENCOUNTER — Other Ambulatory Visit: Payer: Self-pay | Admitting: Adult Health

## 2016-01-13 DIAGNOSIS — M25562 Pain in left knee: Secondary | ICD-10-CM | POA: Diagnosis not present

## 2016-01-13 DIAGNOSIS — Z96652 Presence of left artificial knee joint: Secondary | ICD-10-CM | POA: Diagnosis not present

## 2016-01-13 DIAGNOSIS — M25662 Stiffness of left knee, not elsewhere classified: Secondary | ICD-10-CM | POA: Diagnosis not present

## 2016-01-14 DIAGNOSIS — R5383 Other fatigue: Secondary | ICD-10-CM | POA: Diagnosis not present

## 2016-01-15 ENCOUNTER — Other Ambulatory Visit: Payer: Self-pay | Admitting: Adult Health

## 2016-01-15 DIAGNOSIS — Z96652 Presence of left artificial knee joint: Secondary | ICD-10-CM | POA: Diagnosis not present

## 2016-01-15 DIAGNOSIS — M25562 Pain in left knee: Secondary | ICD-10-CM | POA: Diagnosis not present

## 2016-01-15 DIAGNOSIS — M25662 Stiffness of left knee, not elsewhere classified: Secondary | ICD-10-CM | POA: Diagnosis not present

## 2016-01-16 DIAGNOSIS — E782 Mixed hyperlipidemia: Secondary | ICD-10-CM | POA: Diagnosis not present

## 2016-01-16 DIAGNOSIS — M791 Myalgia: Secondary | ICD-10-CM | POA: Diagnosis not present

## 2016-01-16 DIAGNOSIS — I10 Essential (primary) hypertension: Secondary | ICD-10-CM | POA: Diagnosis not present

## 2016-01-16 DIAGNOSIS — E876 Hypokalemia: Secondary | ICD-10-CM | POA: Diagnosis not present

## 2016-01-19 DIAGNOSIS — Z96652 Presence of left artificial knee joint: Secondary | ICD-10-CM | POA: Diagnosis not present

## 2016-01-19 DIAGNOSIS — M25662 Stiffness of left knee, not elsewhere classified: Secondary | ICD-10-CM | POA: Diagnosis not present

## 2016-01-19 DIAGNOSIS — M25562 Pain in left knee: Secondary | ICD-10-CM | POA: Diagnosis not present

## 2016-01-26 DIAGNOSIS — M25562 Pain in left knee: Secondary | ICD-10-CM | POA: Diagnosis not present

## 2016-01-26 DIAGNOSIS — M25662 Stiffness of left knee, not elsewhere classified: Secondary | ICD-10-CM | POA: Diagnosis not present

## 2016-01-26 DIAGNOSIS — Z96652 Presence of left artificial knee joint: Secondary | ICD-10-CM | POA: Diagnosis not present

## 2016-01-28 DIAGNOSIS — M25474 Effusion, right foot: Secondary | ICD-10-CM | POA: Diagnosis not present

## 2016-02-04 DIAGNOSIS — M25562 Pain in left knee: Secondary | ICD-10-CM | POA: Diagnosis not present

## 2016-02-20 ENCOUNTER — Other Ambulatory Visit: Payer: Self-pay | Admitting: Family Medicine

## 2016-02-20 DIAGNOSIS — R9389 Abnormal findings on diagnostic imaging of other specified body structures: Secondary | ICD-10-CM

## 2016-02-23 DIAGNOSIS — R05 Cough: Secondary | ICD-10-CM | POA: Diagnosis not present

## 2016-02-23 DIAGNOSIS — J0100 Acute maxillary sinusitis, unspecified: Secondary | ICD-10-CM | POA: Diagnosis not present

## 2016-02-25 ENCOUNTER — Ambulatory Visit
Admission: RE | Admit: 2016-02-25 | Discharge: 2016-02-25 | Disposition: A | Discharge status: Home / Self Care | Payer: Medicare Other | Source: Ambulatory Visit | Attending: Family Medicine | Admitting: Family Medicine

## 2016-02-25 DIAGNOSIS — R9389 Abnormal findings on diagnostic imaging of other specified body structures: Secondary | ICD-10-CM

## 2016-02-25 DIAGNOSIS — R918 Other nonspecific abnormal finding of lung field: Secondary | ICD-10-CM | POA: Diagnosis not present

## 2016-03-05 DIAGNOSIS — M25474 Effusion, right foot: Secondary | ICD-10-CM | POA: Diagnosis not present

## 2016-03-15 DIAGNOSIS — M79671 Pain in right foot: Secondary | ICD-10-CM | POA: Diagnosis not present

## 2016-03-15 DIAGNOSIS — M76811 Anterior tibial syndrome, right leg: Secondary | ICD-10-CM | POA: Diagnosis not present

## 2016-03-15 DIAGNOSIS — G8929 Other chronic pain: Secondary | ICD-10-CM | POA: Diagnosis not present

## 2016-03-19 ENCOUNTER — Other Ambulatory Visit: Payer: Self-pay | Admitting: Oncology

## 2016-03-19 DIAGNOSIS — Z853 Personal history of malignant neoplasm of breast: Secondary | ICD-10-CM

## 2016-03-22 ENCOUNTER — Other Ambulatory Visit: Payer: Self-pay | Admitting: *Deleted

## 2016-03-24 ENCOUNTER — Telehealth: Payer: Self-pay | Admitting: *Deleted

## 2016-03-24 NOTE — Telephone Encounter (Signed)
Patient received CT scan without contrast on 02/2016. Patient is scheduled to have a CT Scan with contrast. Patient would like to know do we have to get the CT with contrast or just use the CT scan from December 2017. Please call patient with answer.

## 2016-03-31 DIAGNOSIS — H6983 Other specified disorders of Eustachian tube, bilateral: Secondary | ICD-10-CM | POA: Diagnosis not present

## 2016-03-31 DIAGNOSIS — H9313 Tinnitus, bilateral: Secondary | ICD-10-CM | POA: Diagnosis not present

## 2016-03-31 DIAGNOSIS — H903 Sensorineural hearing loss, bilateral: Secondary | ICD-10-CM | POA: Diagnosis not present

## 2016-03-31 DIAGNOSIS — H906 Mixed conductive and sensorineural hearing loss, bilateral: Secondary | ICD-10-CM | POA: Diagnosis not present

## 2016-04-01 DIAGNOSIS — R062 Wheezing: Secondary | ICD-10-CM | POA: Diagnosis not present

## 2016-04-01 DIAGNOSIS — J209 Acute bronchitis, unspecified: Secondary | ICD-10-CM | POA: Diagnosis not present

## 2016-04-01 DIAGNOSIS — R05 Cough: Secondary | ICD-10-CM | POA: Diagnosis not present

## 2016-04-06 ENCOUNTER — Other Ambulatory Visit: Payer: Self-pay | Admitting: Adult Health

## 2016-04-16 ENCOUNTER — Telehealth: Payer: Self-pay | Admitting: Emergency Medicine

## 2016-04-16 NOTE — Telephone Encounter (Signed)
Left message with patient advising her that CT chest with contrast is not needed at this time; advised her of future appointments and stated that once she sees Dr Jana Hakim he can determine if additional studies need to be scheduled.

## 2016-04-26 DIAGNOSIS — I10 Essential (primary) hypertension: Secondary | ICD-10-CM | POA: Diagnosis not present

## 2016-04-26 DIAGNOSIS — E782 Mixed hyperlipidemia: Secondary | ICD-10-CM | POA: Diagnosis not present

## 2016-04-26 DIAGNOSIS — E876 Hypokalemia: Secondary | ICD-10-CM | POA: Diagnosis not present

## 2016-04-26 DIAGNOSIS — K219 Gastro-esophageal reflux disease without esophagitis: Secondary | ICD-10-CM | POA: Diagnosis not present

## 2016-04-26 DIAGNOSIS — R5383 Other fatigue: Secondary | ICD-10-CM | POA: Diagnosis not present

## 2016-04-26 DIAGNOSIS — D72829 Elevated white blood cell count, unspecified: Secondary | ICD-10-CM | POA: Diagnosis not present

## 2016-04-26 DIAGNOSIS — E559 Vitamin D deficiency, unspecified: Secondary | ICD-10-CM | POA: Diagnosis not present

## 2016-04-26 DIAGNOSIS — F411 Generalized anxiety disorder: Secondary | ICD-10-CM | POA: Diagnosis not present

## 2016-04-26 DIAGNOSIS — Z0000 Encounter for general adult medical examination without abnormal findings: Secondary | ICD-10-CM | POA: Diagnosis not present

## 2016-04-26 DIAGNOSIS — M179 Osteoarthritis of knee, unspecified: Secondary | ICD-10-CM | POA: Diagnosis not present

## 2016-04-26 DIAGNOSIS — R7301 Impaired fasting glucose: Secondary | ICD-10-CM | POA: Diagnosis not present

## 2016-04-28 DIAGNOSIS — Z79899 Other long term (current) drug therapy: Secondary | ICD-10-CM | POA: Diagnosis not present

## 2016-04-28 DIAGNOSIS — F411 Generalized anxiety disorder: Secondary | ICD-10-CM | POA: Diagnosis not present

## 2016-04-28 DIAGNOSIS — E119 Type 2 diabetes mellitus without complications: Secondary | ICD-10-CM | POA: Diagnosis not present

## 2016-04-28 DIAGNOSIS — E782 Mixed hyperlipidemia: Secondary | ICD-10-CM | POA: Diagnosis not present

## 2016-04-30 DIAGNOSIS — M25562 Pain in left knee: Secondary | ICD-10-CM | POA: Diagnosis not present

## 2016-04-30 DIAGNOSIS — Z96652 Presence of left artificial knee joint: Secondary | ICD-10-CM | POA: Diagnosis not present

## 2016-05-03 ENCOUNTER — Ambulatory Visit
Admission: RE | Admit: 2016-05-03 | Discharge: 2016-05-03 | Disposition: A | Discharge status: Home / Self Care | Payer: Medicare Other | Source: Ambulatory Visit | Attending: Oncology | Admitting: Oncology

## 2016-05-03 DIAGNOSIS — Z853 Personal history of malignant neoplasm of breast: Secondary | ICD-10-CM

## 2016-05-03 DIAGNOSIS — R928 Other abnormal and inconclusive findings on diagnostic imaging of breast: Secondary | ICD-10-CM | POA: Diagnosis not present

## 2016-05-04 ENCOUNTER — Other Ambulatory Visit (HOSPITAL_BASED_OUTPATIENT_CLINIC_OR_DEPARTMENT_OTHER): Payer: Medicare Other

## 2016-05-04 DIAGNOSIS — M25562 Pain in left knee: Secondary | ICD-10-CM | POA: Diagnosis not present

## 2016-05-04 DIAGNOSIS — Z96652 Presence of left artificial knee joint: Secondary | ICD-10-CM | POA: Diagnosis not present

## 2016-05-04 DIAGNOSIS — C50411 Malignant neoplasm of upper-outer quadrant of right female breast: Secondary | ICD-10-CM | POA: Diagnosis present

## 2016-05-04 LAB — CBC WITH DIFFERENTIAL/PLATELET
BASO%: 0.6 % (ref 0.0–2.0)
Basophils Absolute: 0 10*3/uL (ref 0.0–0.1)
EOS%: 1.7 % (ref 0.0–7.0)
Eosinophils Absolute: 0.1 10*3/uL (ref 0.0–0.5)
HEMATOCRIT: 37.8 % (ref 34.8–46.6)
HEMOGLOBIN: 13 g/dL (ref 11.6–15.9)
LYMPH#: 2.5 10*3/uL (ref 0.9–3.3)
LYMPH%: 37 % (ref 14.0–49.7)
MCH: 30.5 pg (ref 25.1–34.0)
MCHC: 34.4 g/dL (ref 31.5–36.0)
MCV: 88.6 fL (ref 79.5–101.0)
MONO#: 0.7 10*3/uL (ref 0.1–0.9)
MONO%: 10 % (ref 0.0–14.0)
NEUT%: 50.7 % (ref 38.4–76.8)
NEUTROS ABS: 3.4 10*3/uL (ref 1.5–6.5)
PLATELETS: 282 10*3/uL (ref 145–400)
RBC: 4.27 10*6/uL (ref 3.70–5.45)
RDW: 13.4 % (ref 11.2–14.5)
WBC: 6.6 10*3/uL (ref 3.9–10.3)

## 2016-05-04 LAB — COMPREHENSIVE METABOLIC PANEL
ALBUMIN: 4.3 g/dL (ref 3.5–5.0)
ALK PHOS: 75 U/L (ref 40–150)
ALT: 21 U/L (ref 0–55)
ANION GAP: 10 meq/L (ref 3–11)
AST: 22 U/L (ref 5–34)
BILIRUBIN TOTAL: 0.89 mg/dL (ref 0.20–1.20)
BUN: 13.1 mg/dL (ref 7.0–26.0)
CALCIUM: 10.3 mg/dL (ref 8.4–10.4)
CO2: 30 mEq/L — ABNORMAL HIGH (ref 22–29)
Chloride: 101 mEq/L (ref 98–109)
Creatinine: 0.7 mg/dL (ref 0.6–1.1)
EGFR: 78 mL/min/{1.73_m2} — AB (ref >90)
Glucose: 143 mg/dl — ABNORMAL HIGH (ref 70–140)
Potassium: 4.2 mEq/L (ref 3.5–5.1)
SODIUM: 140 meq/L (ref 136–145)
TOTAL PROTEIN: 8 g/dL (ref 6.4–8.3)

## 2016-05-05 DIAGNOSIS — E119 Type 2 diabetes mellitus without complications: Secondary | ICD-10-CM | POA: Diagnosis not present

## 2016-05-11 ENCOUNTER — Ambulatory Visit (HOSPITAL_BASED_OUTPATIENT_CLINIC_OR_DEPARTMENT_OTHER): Payer: Medicare Other | Admitting: Oncology

## 2016-05-11 VITALS — BP 137/49 | HR 70 | Temp 97.4°F | Resp 18 | Ht 64.0 in | Wt 184.4 lb

## 2016-05-11 DIAGNOSIS — Z853 Personal history of malignant neoplasm of breast: Secondary | ICD-10-CM

## 2016-05-11 DIAGNOSIS — C50411 Malignant neoplasm of upper-outer quadrant of right female breast: Secondary | ICD-10-CM

## 2016-05-11 DIAGNOSIS — Z170 Estrogen receptor positive status [ER+]: Principal | ICD-10-CM

## 2016-05-11 NOTE — Progress Notes (Signed)
. ID: Sheila Gardner   DOB: Aug 13, 1935  MR#: [id number]  CSN#:[id number]  Patient Care Team: Lawerance Cruel, MD as PCP - General (Family Medicine) Chauncey Cruel, MD as Consulting Physician (Hematology and Oncology) Thea Silversmith, MD (Inactive) as Consulting Physician (Radiation Oncology) Amada Kingfisher, MD (Inactive) as Consulting Physician (Hematology and Oncology)   CHIEF COMPLAINT: hx right breast cancer  CURRENT TREATMENT: observation  BREAST CANCER HISTORY: From the original intake note:  Sheila Gardner had her routine screening mammography March 11, 2009 at [facility]. This showed a possible mass in the right breast.  She was recalled for additional studies of the right breast on January 7th.  This confirmed a small rounded mass with ill-defined margins, deep in the right breast, which was not palpable by Dr. Joneen Gardner.  Ultrasound showed it to be 8 mm macrolobulated and near a normal-appearing lymph node in the inferior axillary region.  The patient was brought back for ultrasound-guided biopsy on January 12th and this showed ([id number]) an invasive ductal carcinoma which appeared low grade.  The tumor was estrogen and progesterone receptor positive, both at 100% and it had a low MIB-1 at 10%.  There was no evidence of Her-2 amplification with a ratio by CISH of 1.16.    With this information, the patient was referred to Sheila Gardner and bilateral breast MRIs were obtained January 18th. They showed a solitary area of enhancement in the right breast, measuring up to 1.7 cm.  Otherwise, this was a negative study.  On February 16th, the patient underwent a right lumpectomy and sentinel lymph node biopsy and this showed ([id number]) an invasive ductal carcinoma which measured 8 mm with negative and ample margins and no lymphovascular invasion.  The tumor was Grade 1 and 0 of 3 sentinel lymph nodes were involved. Her subsequent history is as detailed below.  INTERVAL HISTORY: Sheila Gardner returns  today for follow-up of her estrogen receptor positive breast cancer. She is completing 5 years of follow-up and is "graduate in" today.  Since her last visit here she had her final CT scan of the chest on 02/25/2016 for follow-up of 2 incidentally noted chest lesions. These show absolutely no change and no further CT scan follow-up is needed.  She also had her bilateral mammography with tomography 05/03/2016, showing no evidence of disease recurrence  REVIEW OF SYSTEMS: A detailed review of systems today was otherwise noncontributory  PAST MEDICAL HISTORY: Past Medical History:  Diagnosis Date  . Arthritis   . Breast cancer ([place])   . Cancer ([place])    Breast- rt  . Cough with sputum 2016  . GERD (gastroesophageal reflux disease)    COSTOCHONDRITIS  . Heart murmur   . Hyperlipidemia   . Hypertension   . Lipoma    back of neck  . PONV (postoperative nausea and vomiting)    only after rt hip surgery  Significant for history of diverticulosis, history of hypercholesterolemia, history of occasional to frequent headaches which by her description are not migraines, history of remote removal of a thyroid mass, history of removal of a colon polyp August of 2004 under Dr. Knox Saliva.  History of degenerative disk disease particularly involving the neck.  History of eardrum replacement about 39 years ago.  History of lens implants for cataracts under Marica Otter.  History of partial ovarian resection bilaterally in 1965.  She had repair of a macular hole in the right eye in December of 2001 under Lorin Glass Harriott.  PAST SURGICAL HISTORY: Past Surgical History:  Procedure Laterality Date  . ANKLE FRACTURE SURGERY Right 05-06-2011   RIGHT   . BREAST SURGERY  04/22/2009   Rt lumpectomy  . EYE SURGERY  1999/05/06   macular hole  . JOINT REPLACEMENT  05-05-14  . MIDDLE EAR SURGERY Left 08/18/2015   repair eardrum and reconstruction  . OVARY SURGERY  40 years ago   wedge  . TOTAL HIP ARTHROPLASTY  Right 07/30/2014   Procedure: TOTAL HIP ARTHROPLASTY ANTERIOR APPROACH;  Surgeon: Melrose Nakayama, MD;  Location: [place];  Service: Orthopedics;  Laterality: Right;  . TOTAL KNEE ARTHROPLASTY Left 11/04/2015  . TOTAL KNEE ARTHROPLASTY Left 11/04/2015   Procedure: TOTAL KNEE ARTHROPLASTY;  Surgeon: Melrose Nakayama, MD;  Location: [place];  Service: Orthopedics;  Laterality: Left;  . TYMPANOPLASTY  30 years ago    FAMILY HISTORY Family History  Problem Relation Age of Onset  . Cancer Brother     colon, pancreatic, brain  The patient's father died at the age of 21 with dementia.  The patient's mother died at the age of 54 with heart disease.  The patient had one sister who died at the age of one year from a pulmonary problem, and a sister who died in an automobile accident at age 32.  A third sister survives at age 6.  The patient had 11 brothers.  One had pancreatic cancer and two had "brain cancer".  There is no breast or ovarian cancer in the immediate family, although there are some cousins with breast cancer, none diagnosed before the age of 45.    GYNECOLOGIC HISTORY: She is GX P2.  First pregnancy to term at age 30.  Went through the change of life at age 34.  She never took hormone replacement.    SOCIAL HISTORY: She and her husband Sheila Gardner, who died in 05/05/2014, ran a [employer].  He has also done some construction work and her two sons, Sheila Gardner and Sheila Gardner, are both in the building trade.  They both live in [place].  Sheila Gardner has a daughter, Sheila Gardner, who helps with the [place] and a daughter, Sheila Gardner, who works in [employer]. The patient has 13 grandchildren.  She attends the [employer].     ADVANCED DIRECTIVES: Not in place  HEALTH MAINTENANCE: Social History  Substance Use Topics  . Smoking status: Never Smoker  . Smokeless tobacco: Never Used  . Alcohol use Yes     Comment: OCC -none now     Colonoscopy: UTD  PAP:  Bone density: 08/09/2011, normal  Lipid panel:    Allergies  Allergen Reactions  . No Known Allergies     Current Outpatient Prescriptions  Medication Sig Dispense Refill  . aspirin EC 325 MG EC tablet Take 1 tablet (325 mg total) by mouth 2 (two) times daily after a meal. 30 tablet 0  . Cholecalciferol (VITAMIN D-3) 1000 UNITS CAPS Take 1 capsule by mouth daily.    . cyanocobalamin 500 MCG tablet Take 500 mcg by mouth daily.    Marland Kitchen KLOR-CON 10 10 MEQ tablet Take 10 mEq by mouth 2 (two) times daily.  6  . losartan (COZAAR) 100 MG tablet Take 100 mg by mouth daily.     Marland Kitchen omeprazole (PRILOSEC) 20 MG capsule Take 20 mg by mouth daily.     . polyethylene glycol (MIRALAX / GLYCOLAX) packet Take 17 g by mouth daily.    . traMADol (ULTRAM) 50 MG tablet Take 1 tablet (  50 mg total) by mouth every 6 (six) hours as needed. pain 10 tablet 0  . vitamin C (ASCORBIC ACID) 500 MG tablet Take 500 mg by mouth daily.     No current facility-administered medications for this visit.     OBJECTIVE: Middle-aged white woman Who appears well  Vitals:   05/11/16 1323  BP: (!) 137/49  Pulse: 70  Resp: 18  Temp: 97.4 F (36.3 C)     Body mass index is 31.65 kg/m.    ECOG FS: 1 Filed Weights   05/11/16 1323  Weight: 184 lb 6.4 oz (83.6 kg)   Sclerae unicteric, EOMs intact Oropharynx clear and moist No cervical or supraclavicular adenopathy Lungs no rales or rhonchi Heart regular rate and rhythm Abd soft, nontender, positive bowel sounds MSK no focal spinal tenderness, no upper extremity lymphedema Neuro: nonfocal, well oriented, appropriate affect Breasts: The right breast has undergone lumpectomy with no evidence of local recurrence left breast is unremarkable. Both axillae are benign.  LAB RESULTS: Lab Results  Component Value Date   WBC 6.6 05/04/2016   NEUTROABS 3.4 05/04/2016   HGB 13.0 05/04/2016   HCT 37.8 05/04/2016   MCV 88.6 05/04/2016   PLT 282 05/04/2016      Chemistry      Component Value Date/Time   NA 140 05/04/2016  1011   K 4.2 05/04/2016 1011   CL 101 10/24/2015 1401   CL 102 05/04/2012 1313   CO2 30 (H) 05/04/2016 1011   BUN 13.1 05/04/2016 1011   CREATININE 0.7 05/04/2016 1011   GLU 108 08/06/2014 1105      Component Value Date/Time   CALCIUM 10.3 05/04/2016 1011   ALKPHOS 75 05/04/2016 1011   AST 22 05/04/2016 1011   ALT 21 05/04/2016 1011   BILITOT 0.89 05/04/2016 1011       Lab Results  Component Value Date   LABCA2 16 05/04/2012     STUDIES: Mm Diag Breast Tomo Bilateral  Result Date: 05/03/2016 CLINICAL DATA:  Annual examination. History of right breast cancer diagnosed in 2011.The patient is asymptomatic. EXAM: 2D DIGITAL DIAGNOSTIC BILATERAL MAMMOGRAM WITH CAD AND ADJUNCT TOMO COMPARISON:  Previous exam(s). ACR Breast Density Category b: There are scattered areas of fibroglandular density. FINDINGS: No mass, nonsurgical distortion, or suspicious microcalcification is identified in either breast to suggest malignancy. There is subtle lumpectomy changes in the right breast. Mammographic images were processed with CAD. IMPRESSION: No evidence of malignancy in either breast. The patient can return to annual screening mammography. RECOMMENDATION: Screening mammogram in one year.(Code:SM-B-01Y) I have discussed the findings and recommendations with the patient. Results were also provided in writing at the conclusion of the visit. If applicable, a reminder letter will be sent to the patient regarding the next appointment. BI-RADS CATEGORY  2: Benign. Electronically Signed   By: Curlene Dolphin M.D.   On: 05/03/2016 12:01     ASSESSMENT: 81 y.o. [place] woman   (1)  status post right lumpectomy and sentinel lymph node dissection in February 2011 for a T1b N0, stage IA invasive ductal carcinoma, grade 1, strongly estrogen and progesterone receptor positive, HER2/neu negative, with a low MIB-1.    (2)  Decided against radiation therapy.    (3)  On anastrozole from April 2011 until May  2012 with arthralgias developing.    (4)  Started letrozole May 2012, stopped June 2013 again due to arthralgias/myalgias.  (5)  followed with observation alone  (6) incidentally noted pulmonary nodule  (7)  transient leukocytosis  PLAN:  Itha is now 7 years out from definitive surgery for for her breast cancer with no evidence of disease recurrence. This is very favorable.  We reviewed her recent mammogram which is fine and also her recent CT scan of the chest, which shows no change in her incidentally noted pulmonary nodules. These require no further evaluation.  At this point I feel comfortable releasing her to her primary care physician. All she will need in terms of breast cancer follow-up is yearly mammography and a yearly physician breast exam.  I will be glad to see Sheila Gardner at any point in the future if on when the need arises but as of now we are making no further routine appointments for her here.  Chauncey Cruel, MD Oncology/Hematology [facility] [phone] (Office)

## 2016-06-01 DIAGNOSIS — H6983 Other specified disorders of Eustachian tube, bilateral: Secondary | ICD-10-CM | POA: Diagnosis not present

## 2016-06-01 DIAGNOSIS — H7202 Central perforation of tympanic membrane, left ear: Secondary | ICD-10-CM | POA: Diagnosis not present

## 2016-06-01 DIAGNOSIS — H903 Sensorineural hearing loss, bilateral: Secondary | ICD-10-CM | POA: Diagnosis not present

## 2016-06-01 DIAGNOSIS — H9313 Tinnitus, bilateral: Secondary | ICD-10-CM | POA: Diagnosis not present

## 2016-07-10 DIAGNOSIS — M25562 Pain in left knee: Secondary | ICD-10-CM | POA: Diagnosis not present

## 2016-07-10 DIAGNOSIS — Z96652 Presence of left artificial knee joint: Secondary | ICD-10-CM | POA: Diagnosis not present

## 2016-07-27 DIAGNOSIS — E782 Mixed hyperlipidemia: Secondary | ICD-10-CM | POA: Diagnosis not present

## 2016-07-27 DIAGNOSIS — E119 Type 2 diabetes mellitus without complications: Secondary | ICD-10-CM | POA: Diagnosis not present

## 2016-07-27 DIAGNOSIS — Z79899 Other long term (current) drug therapy: Secondary | ICD-10-CM | POA: Diagnosis not present

## 2016-07-27 DIAGNOSIS — F411 Generalized anxiety disorder: Secondary | ICD-10-CM | POA: Diagnosis not present

## 2016-07-28 DIAGNOSIS — E782 Mixed hyperlipidemia: Secondary | ICD-10-CM | POA: Diagnosis not present

## 2016-07-28 DIAGNOSIS — E119 Type 2 diabetes mellitus without complications: Secondary | ICD-10-CM | POA: Diagnosis not present

## 2016-07-29 DIAGNOSIS — R262 Difficulty in walking, not elsewhere classified: Secondary | ICD-10-CM | POA: Diagnosis not present

## 2016-07-29 DIAGNOSIS — M25662 Stiffness of left knee, not elsewhere classified: Secondary | ICD-10-CM | POA: Diagnosis not present

## 2016-07-29 DIAGNOSIS — M25562 Pain in left knee: Secondary | ICD-10-CM | POA: Diagnosis not present

## 2016-07-29 DIAGNOSIS — M6281 Muscle weakness (generalized): Secondary | ICD-10-CM | POA: Diagnosis not present

## 2016-08-03 DIAGNOSIS — R262 Difficulty in walking, not elsewhere classified: Secondary | ICD-10-CM | POA: Diagnosis not present

## 2016-08-03 DIAGNOSIS — M25662 Stiffness of left knee, not elsewhere classified: Secondary | ICD-10-CM | POA: Diagnosis not present

## 2016-08-03 DIAGNOSIS — M6281 Muscle weakness (generalized): Secondary | ICD-10-CM | POA: Diagnosis not present

## 2016-08-03 DIAGNOSIS — M25562 Pain in left knee: Secondary | ICD-10-CM | POA: Diagnosis not present

## 2016-08-05 DIAGNOSIS — R262 Difficulty in walking, not elsewhere classified: Secondary | ICD-10-CM | POA: Diagnosis not present

## 2016-08-05 DIAGNOSIS — M25562 Pain in left knee: Secondary | ICD-10-CM | POA: Diagnosis not present

## 2016-08-05 DIAGNOSIS — M25662 Stiffness of left knee, not elsewhere classified: Secondary | ICD-10-CM | POA: Diagnosis not present

## 2016-08-05 DIAGNOSIS — M6281 Muscle weakness (generalized): Secondary | ICD-10-CM | POA: Diagnosis not present

## 2016-08-10 ENCOUNTER — Encounter (HOSPITAL_COMMUNITY): Payer: Self-pay | Admitting: Family Medicine

## 2016-08-10 ENCOUNTER — Ambulatory Visit (HOSPITAL_COMMUNITY)
Admission: EM | Admit: 2016-08-10 | Discharge: 2016-08-10 | Disposition: A | Discharge status: Home / Self Care | Payer: Medicare Other | Attending: Internal Medicine | Admitting: Internal Medicine

## 2016-08-10 DIAGNOSIS — R21 Rash and other nonspecific skin eruption: Secondary | ICD-10-CM

## 2016-08-10 DIAGNOSIS — M25562 Pain in left knee: Secondary | ICD-10-CM | POA: Diagnosis not present

## 2016-08-10 DIAGNOSIS — M6281 Muscle weakness (generalized): Secondary | ICD-10-CM | POA: Diagnosis not present

## 2016-08-10 DIAGNOSIS — M25662 Stiffness of left knee, not elsewhere classified: Secondary | ICD-10-CM | POA: Diagnosis not present

## 2016-08-10 DIAGNOSIS — R262 Difficulty in walking, not elsewhere classified: Secondary | ICD-10-CM | POA: Diagnosis not present

## 2016-08-10 MED ORDER — PREDNISONE 20 MG PO TABS: 20.0000 mg | ORAL_TABLET | Freq: Every day | ORAL | 0 refills | Status: DC

## 2016-08-10 MED ORDER — METHYLPREDNISOLONE SODIUM SUCC 125 MG IJ SOLR
INTRAMUSCULAR | Status: AC
Start: 2016-08-10 — End: 2016-08-10
  Filled 2016-08-10: qty 2

## 2016-08-10 MED ORDER — METHYLPREDNISOLONE SODIUM SUCC 125 MG IJ SOLR
125.0000 mg | Freq: Once | INTRAMUSCULAR | Status: AC
  Administered 2016-08-10: 125 mg via INTRAMUSCULAR

## 2016-08-10 MED ORDER — PERMETHRIN 5 % EX CREA: TOPICAL_CREAM | CUTANEOUS | 1 refills | Status: DC

## 2016-08-10 NOTE — ED Triage Notes (Signed)
Pt here for rash to back and arms. sts itchy. sts since the weekend.

## 2016-08-10 NOTE — ED Provider Notes (Signed)
[name]    CSN: [id number] Arrival date & time: 08/10/16  1857     History   Chief Complaint Chief Complaint  Patient presents with  . Rash    HPI Sheila Gardner is a 81 y.o. female. She presents today with a couple of days history of itching in her skin folds particularly under the breasts, and the waistline, under her arms. Little bit of itching starting in the groins. She has had scabies in the past. The itching is keeping her awake at night. The rash consists of tiny scattered papules, infrequent, some on the hands, some on the arms and legs and many under the breasts and under the arms, at the waistline.    HPI  Past Medical History:  Diagnosis Date  . Arthritis   . Breast cancer ([place])   . Cancer (Sheila Gardner)    Breast- rt  . Cough with sputum 2016  . GERD (gastroesophageal reflux disease)    COSTOCHONDRITIS  . Heart murmur   . Hyperlipidemia   . Hypertension   . Lipoma    back of neck  . PONV (postoperative nausea and vomiting)    only after rt hip surgery    Patient Active Problem List   Diagnosis Date Noted  . Primary osteoarthritis of left knee 11/04/2015  . Primary osteoarthritis of right hip 07/30/2014  . Hip pain 05/08/2014  . Elevated liver function tests 05/09/2013  . Vitamin D deficiency 03/25/2011  . Malignant neoplasm of upper-outer quadrant of right breast in female, estrogen receptor positive (Sheila Gardner) 12/30/2010    Past Surgical History:  Procedure Laterality Date  . ANKLE FRACTURE SURGERY Right 2013   RIGHT   . BREAST SURGERY  04/22/2009   Rt lumpectomy  . EYE SURGERY  2001   macular hole  . JOINT REPLACEMENT  2016  . MIDDLE EAR SURGERY Left 08/18/2015   repair eardrum and reconstruction  . OVARY SURGERY  40 years ago   wedge  . TOTAL HIP ARTHROPLASTY Right 07/30/2014   Procedure: TOTAL HIP ARTHROPLASTY ANTERIOR APPROACH;  Surgeon: Melrose Nakayama, MD;  Location: [place];  Service: Orthopedics;  Laterality: Right;  . TOTAL KNEE  ARTHROPLASTY Left 11/04/2015  . TOTAL KNEE ARTHROPLASTY Left 11/04/2015   Procedure: TOTAL KNEE ARTHROPLASTY;  Surgeon: Melrose Nakayama, MD;  Location: [place];  Service: Orthopedics;  Laterality: Left;  . TYMPANOPLASTY  30 years ago     Home Medications    Prior to Admission medications   Medication Sig Start Date End Date Taking? Authorizing Provider  aspirin EC 325 MG EC tablet Take 1 tablet (325 mg total) by mouth 2 (two) times daily after a meal. 11/06/15   Loni Dolly, PA-C  Cholecalciferol (VITAMIN D-3) 1000 UNITS CAPS Take 1 capsule by mouth daily.    [provider]  cyanocobalamin 500 MCG tablet Take 500 mcg by mouth daily.    [provider]  KLOR-CON 10 10 MEQ tablet Take 10 mEq by mouth 2 (two) times daily. 08/17/15   [provider]  losartan (COZAAR) 100 MG tablet Take 100 mg by mouth daily.     [provider]  omeprazole (PRILOSEC) 20 MG capsule Take 20 mg by mouth daily.  12/25/10   [provider]  permethrin (ELIMITE) 5 % cream Apply to affected area once, can repeat treatment in 1 week. 08/10/16   Sherlene Shams, MD  polyethylene glycol [facility] Floria Raveling) packet Take 17 g by mouth daily.  [provider]  predniSONE (DELTASONE) 20 MG tablet Take 1 tablet (20 mg total) by mouth daily. 3 tabs qd x 3d then 2 tabs qd x 3d then 1 tab qd x 3d then 0.5 tab qd x 4d then stop. 08/10/16   Sherlene Shams, MD  traMADol (ULTRAM) 50 MG tablet Take 1 tablet (50 mg total) by mouth every 6 (six) hours as needed. pain 05/08/14   Boelter, Genelle Gather, NP  vitamin C (ASCORBIC ACID) 500 MG tablet Take 500 mg by mouth daily.    [provider]    Family History Family History  Problem Relation Age of Onset  . Cancer Brother        colon, pancreatic, brain    Social History Social History  Substance Use Topics  . Smoking status: Never Smoker  . Smokeless tobacco: Never Used  . Alcohol use Yes     Comment: OCC -none now      Allergies   No known allergies   Review of Systems Review of Systems  All other systems reviewed and are negative.    Physical Exam Triage Vital Signs ED Triage Vitals [08/10/16 1934]  Enc Vitals Group     BP (!) 139/55     Pulse Rate 63     Resp 18     Temp 98.1 F (36.7 C)     Temp src      SpO2 100 %     Weight      Height      Pain Score      Pain Loc    Updated Vital Signs BP (!) 139/55   Pulse 63   Temp 98.1 F (36.7 C)   Resp 18   SpO2 100%   Physical Exam  Constitutional: She is oriented to person, place, and time. No distress.  HENT:  Head: Atraumatic.  Eyes:  Conjugate gaze observed, no eye redness/discharge  Neck: Neck supple.  Cardiovascular: Normal rate.   Pulmonary/Chest: No respiratory distress.  Abdominal: She exhibits no distension.  Musculoskeletal: Normal range of motion.  Neurological: She is alert and oriented to person, place, and time.  Skin: Skin is warm and dry.     Tiny red papules, some inflamed from scratching, most clustered under the right axilla, under the left breast, in the waistline area, right groin. Couple tiny red papules on the hands and the web spaces.  Nursing note and vitals reviewed.    UC Treatments / Results   Procedures Procedures (including critical care time)  Medications Ordered in UC Medications  methylPREDNISolone sodium succinate (SOLU-MEDROL) 125 mg/2 mL injection 125 mg (125 mg Intramuscular Given 08/10/16 2052)     Final Clinical Impressions(s) / UC Diagnoses   Final diagnoses:  Rash and nonspecific skin eruption   Rash pattern and itching are consistent with scabies.  Injection of solumedrol (steroid) for itching was given tonight; prescriptions for permethrin cream and prednisone (steroid, for itching) were sent to the pharmacy.  Recheck or followup with primary care provider if rash/itching are not improving in the next several days.  Itching may take a couple weeks to subside.     New Prescriptions Discharge Medication List as of 08/10/2016  8:45 PM    START taking these medications   Details  permethrin (ELIMITE) 5 % cream Apply to affected area once, can repeat treatment in 1 week., Normal    predniSONE (DELTASONE) 20 MG tablet Take 1 tablet (20 mg total) by mouth daily. 3  tabs qd x 3d then 2 tabs qd x 3d then 1 tab qd x 3d then 0.5 tab qd x 4d then stop., Starting Tue 08/10/2016, Normal         Sherlene Shams, MD 08/11/16 1334

## 2016-08-10 NOTE — ED Notes (Signed)
Bed: UC03 Expected date:  Expected time:  Means of arrival:  Comments: Held

## 2016-08-10 NOTE — Discharge Instructions (Addendum)
Rash pattern and itching are consistent with scabies.  Injection of solumedrol (steroid) for itching was given tonight; prescriptions for permethrin cream and prednisone (steroid, for itching) were sent to the pharmacy.  Recheck or followup with primary care provider if rash/itching are not improving in the next several days.  Itching may take a couple weeks to subside.

## 2016-08-12 DIAGNOSIS — M6281 Muscle weakness (generalized): Secondary | ICD-10-CM | POA: Diagnosis not present

## 2016-08-12 DIAGNOSIS — M25662 Stiffness of left knee, not elsewhere classified: Secondary | ICD-10-CM | POA: Diagnosis not present

## 2016-08-12 DIAGNOSIS — M25562 Pain in left knee: Secondary | ICD-10-CM | POA: Diagnosis not present

## 2016-08-12 DIAGNOSIS — R262 Difficulty in walking, not elsewhere classified: Secondary | ICD-10-CM | POA: Diagnosis not present

## 2016-08-13 ENCOUNTER — Encounter: Payer: Self-pay | Admitting: *Deleted

## 2016-08-19 DIAGNOSIS — M25662 Stiffness of left knee, not elsewhere classified: Secondary | ICD-10-CM | POA: Diagnosis not present

## 2016-08-19 DIAGNOSIS — M6281 Muscle weakness (generalized): Secondary | ICD-10-CM | POA: Diagnosis not present

## 2016-08-19 DIAGNOSIS — M25562 Pain in left knee: Secondary | ICD-10-CM | POA: Diagnosis not present

## 2016-08-19 DIAGNOSIS — R262 Difficulty in walking, not elsewhere classified: Secondary | ICD-10-CM | POA: Diagnosis not present

## 2016-09-06 DIAGNOSIS — H903 Sensorineural hearing loss, bilateral: Secondary | ICD-10-CM | POA: Diagnosis not present

## 2016-09-06 DIAGNOSIS — H6983 Other specified disorders of Eustachian tube, bilateral: Secondary | ICD-10-CM | POA: Diagnosis not present

## 2016-09-06 DIAGNOSIS — H9313 Tinnitus, bilateral: Secondary | ICD-10-CM | POA: Diagnosis not present

## 2016-10-25 DIAGNOSIS — E119 Type 2 diabetes mellitus without complications: Secondary | ICD-10-CM | POA: Diagnosis not present

## 2016-10-25 DIAGNOSIS — M25562 Pain in left knee: Secondary | ICD-10-CM | POA: Diagnosis not present

## 2016-10-26 ENCOUNTER — Other Ambulatory Visit (HOSPITAL_COMMUNITY): Payer: Self-pay | Admitting: Orthopaedic Surgery

## 2016-10-26 DIAGNOSIS — M25562 Pain in left knee: Secondary | ICD-10-CM

## 2016-10-28 ENCOUNTER — Encounter (HOSPITAL_COMMUNITY)
Admission: RE | Admit: 2016-10-28 | Discharge: 2016-10-28 | Disposition: A | Discharge status: Home / Self Care | Payer: Medicare Other | Source: Ambulatory Visit | Attending: Orthopaedic Surgery | Admitting: Orthopaedic Surgery

## 2016-10-28 DIAGNOSIS — M25562 Pain in left knee: Secondary | ICD-10-CM

## 2016-10-28 MED ORDER — TECHNETIUM TC 99M MEDRONATE IV KIT
25.0000 | PACK | Freq: Once | INTRAVENOUS | Status: AC | PRN
  Administered 2016-10-28: 25 via INTRAVENOUS

## 2016-11-04 ENCOUNTER — Ambulatory Visit (HOSPITAL_COMMUNITY)
Admission: EM | Admit: 2016-11-04 | Discharge: 2016-11-04 | Disposition: A | Discharge status: Home / Self Care | Payer: Medicare Other | Attending: Family Medicine | Admitting: Family Medicine

## 2016-11-04 ENCOUNTER — Encounter (HOSPITAL_COMMUNITY): Payer: Self-pay | Admitting: Nurse Practitioner

## 2016-11-04 DIAGNOSIS — R21 Rash and other nonspecific skin eruption: Secondary | ICD-10-CM | POA: Diagnosis not present

## 2016-11-04 DIAGNOSIS — M25562 Pain in left knee: Secondary | ICD-10-CM | POA: Diagnosis not present

## 2016-11-04 DIAGNOSIS — Z09 Encounter for follow-up examination after completed treatment for conditions other than malignant neoplasm: Secondary | ICD-10-CM | POA: Diagnosis not present

## 2016-11-04 DIAGNOSIS — L249 Irritant contact dermatitis, unspecified cause: Secondary | ICD-10-CM

## 2016-11-04 DIAGNOSIS — Z96652 Presence of left artificial knee joint: Secondary | ICD-10-CM | POA: Diagnosis not present

## 2016-11-04 MED ORDER — PYRITHIONE ZINC 1 % EX SHAM: MEDICATED_SHAMPOO | Freq: Every day | CUTANEOUS | 0 refills | Status: DC | PRN

## 2016-11-04 MED ORDER — METHYLPREDNISOLONE 4 MG PO TBPK: ORAL_TABLET | ORAL | 0 refills | Status: DC

## 2016-11-04 MED ORDER — HYDROXYZINE HCL 25 MG PO TABS: 25.0000 mg | ORAL_TABLET | Freq: Four times a day (QID) | ORAL | 0 refills | Status: DC

## 2016-11-04 NOTE — ED Provider Notes (Signed)
[place]   [id number] 11/04/16 Arrival Time: 2725  ASSESSMENT & PLAN:  1. Rash   2. Irritant contact dermatitis, unspecified trigger     Meds ordered this encounter  Medications  . pyrithione zinc (HEAD AND SHOULDERS) 1 % shampoo    Sig: Apply topically daily as needed for itching.    Dispense:  400 mL    Refill:  0    Order Specific Question:   Supervising Provider    Answer:   Vanessa Kick [id number]  . methylPREDNISolone (MEDROL DOSEPAK) 4 MG TBPK tablet    Sig: Take 6-5-4-3-2-1 po qd    Dispense:  21 tablet    Refill:  0    Order Specific Question:   Supervising Provider    AnswerVanessa Kick [[id number]]  . hydrOXYzine (ATARAX/VISTARIL) 25 MG tablet    Sig: Take 1 tablet (25 mg total) by mouth every 6 (six) hours.    Dispense:  24 tablet    Refill:  0    Order Specific Question:   Supervising Provider    Answer:   Vanessa Kick [[id number]]    Reviewed expectations re: course of current medical issues. Questions answered. Outlined signs and symptoms indicating need for more acute intervention. Patient verbalized understanding. After Visit Summary given.   SUBJECTIVE:  Sheila Gardner is a 81 y.o. female who presents with complaint of itchy scalp.  She states she was treated for scabies last week and she thinks this rash is a result of scabies exposure.  ROS: As per HPI.   OBJECTIVE:  Vitals:   11/04/16 1733  BP: (!) 119/56  Pulse: 67  Resp: 18  Temp: 98.1 F (36.7 C)  TempSrc: Oral  SpO2: 98%    General appearance: alert; no distress Eyes: PERRLA; EOMI; conjunctiva normal HENT: normocephalic; atraumatic; TMs normal; nasal mucosa normal; oral mucosa normal Neck: supple Lungs: clear to auscultation bilaterally Heart: regular rate and rhythm Abdomen: soft, non-tender; bowel sounds normal; no masses or organomegaly; no guarding or rebound tenderness Back: no CVA tenderness Extremities: no cyanosis or edema; symmetrical with no gross  deformities Skin: Occipital scalp with erythema and itchy rash Neurologic: normal gait; normal symmetric reflexes Psychological: alert and cooperative; normal mood and affect  Labs: Results for orders placed or performed in visit on 05/04/16  CBC with Differential/Platelet  Result Value Ref Range   WBC 6.6 3.9 - 10.3 10e3/uL   NEUT# 3.4 1.5 - 6.5 10e3/uL   HGB 13.0 11.6 - 15.9 g/dL   HCT 37.8 34.8 - 46.6 %   Platelets 282 145 - 400 10e3/uL   MCV 88.6 79.5 - 101.0 fL   MCH 30.5 25.1 - 34.0 pg   MCHC 34.4 31.5 - 36.0 g/dL   RBC 4.27 3.70 - 5.45 10e6/uL   RDW 13.4 11.2 - 14.5 %   lymph# 2.5 0.9 - 3.3 10e3/uL   MONO# 0.7 0.1 - 0.9 10e3/uL   Eosinophils Absolute 0.1 0.0 - 0.5 10e3/uL   Basophils Absolute 0.0 0.0 - 0.1 10e3/uL   NEUT% 50.7 38.4 - 76.8 %   LYMPH% 37.0 14.0 - 49.7 %   MONO% 10.0 0.0 - 14.0 %   EOS% 1.7 0.0 - 7.0 %   BASO% 0.6 0.0 - 2.0 %  Comprehensive metabolic panel  Result Value Ref Range   Sodium 140 136 - 145 mEq/L   Potassium 4.2 3.5 - 5.1 mEq/L   Chloride 101 98 - 109 mEq/L   CO2 30 (H) 22 -  29 mEq/L   Glucose 143 (H) 70 - 140 mg/dl   BUN 13.1 7.0 - 26.0 mg/dL   Creatinine 0.7 0.6 - 1.1 mg/dL   Total Bilirubin 0.89 0.20 - 1.20 mg/dL   Alkaline Phosphatase 75 40 - 150 U/L   AST 22 5 - 34 U/L   ALT 21 0 - 55 U/L   Total Protein 8.0 6.4 - 8.3 g/dL   Albumin 4.3 3.5 - 5.0 g/dL   Calcium 10.3 8.4 - 10.4 mg/dL   Anion Gap 10 3 - 11 mEq/L   EGFR 78 (L) >90 ml/min/1.73 m2   Labs Reviewed - No data to display  Imaging: No results found.  Allergies  Allergen Reactions  . No Known Allergies     Past Medical History:  Diagnosis Date  . Arthritis   . Breast cancer ([place])   . Cancer ([place])    Breast- rt  . Cough with sputum 2016  . GERD (gastroesophageal reflux disease)    COSTOCHONDRITIS  . Heart murmur   . Hyperlipidemia   . Hypertension   . Lipoma    back of neck  . PONV (postoperative nausea and vomiting)    only after rt hip surgery    Social History   Social History  . Marital status: Widowed    Spouse name: N/A  . Number of children: N/A  . Years of education: N/A   Occupational History  . Not on file.   Social History Main Topics  . Smoking status: Never Smoker  . Smokeless tobacco: Never Used  . Alcohol use Yes     Comment: OCC -none now  . Drug use: No  . Sexual activity: Not on file   Other Topics Concern  . Not on file   Social History Narrative  . No narrative on file   Family History  Problem Relation Age of Onset  . Cancer Brother        colon, pancreatic, brain   Past Surgical History:  Procedure Laterality Date  . ANKLE FRACTURE SURGERY Right 2013   RIGHT   . BREAST SURGERY  04/22/2009   Rt lumpectomy  . EYE SURGERY  2001   macular hole  . JOINT REPLACEMENT  2016  . MIDDLE EAR SURGERY Left 08/18/2015   repair eardrum and reconstruction  . OVARY SURGERY  40 years ago   wedge  . TOTAL HIP ARTHROPLASTY Right 07/30/2014   Procedure: TOTAL HIP ARTHROPLASTY ANTERIOR APPROACH;  Surgeon: Melrose Nakayama, MD;  Location: [place];  Service: Orthopedics;  Laterality: Right;  . TOTAL KNEE ARTHROPLASTY Left 11/04/2015  . TOTAL KNEE ARTHROPLASTY Left 11/04/2015   Procedure: TOTAL KNEE ARTHROPLASTY;  Surgeon: Melrose Nakayama, MD;  Location: [place];  Service: Orthopedics;  Laterality: Left;  . TYMPANOPLASTY  30 years ago     Lysbeth Penner, [place] 11/04/16 [phone]

## 2016-11-04 NOTE — ED Triage Notes (Signed)
Pt presents with c/o rash. The rash is to her posterior head and is itching and burning. Shes applied several otc topical medications to the rash with no improvement. She was treated for scabies in June and her symptoms overall improved but the rash in her head has persisted since.

## 2016-11-17 DIAGNOSIS — M25562 Pain in left knee: Secondary | ICD-10-CM | POA: Diagnosis not present

## 2016-11-17 DIAGNOSIS — M6281 Muscle weakness (generalized): Secondary | ICD-10-CM | POA: Diagnosis not present

## 2016-11-17 DIAGNOSIS — Z96652 Presence of left artificial knee joint: Secondary | ICD-10-CM | POA: Diagnosis not present

## 2016-11-18 DIAGNOSIS — M25562 Pain in left knee: Secondary | ICD-10-CM | POA: Diagnosis not present

## 2016-11-29 DIAGNOSIS — Z96652 Presence of left artificial knee joint: Secondary | ICD-10-CM | POA: Diagnosis not present

## 2016-11-29 DIAGNOSIS — M6281 Muscle weakness (generalized): Secondary | ICD-10-CM | POA: Diagnosis not present

## 2016-11-29 DIAGNOSIS — M25562 Pain in left knee: Secondary | ICD-10-CM | POA: Diagnosis not present

## 2016-12-01 DIAGNOSIS — Z96652 Presence of left artificial knee joint: Secondary | ICD-10-CM | POA: Diagnosis not present

## 2016-12-01 DIAGNOSIS — M25562 Pain in left knee: Secondary | ICD-10-CM | POA: Diagnosis not present

## 2016-12-01 DIAGNOSIS — M6281 Muscle weakness (generalized): Secondary | ICD-10-CM | POA: Diagnosis not present

## 2016-12-08 DIAGNOSIS — Z96652 Presence of left artificial knee joint: Secondary | ICD-10-CM | POA: Diagnosis not present

## 2016-12-08 DIAGNOSIS — M25562 Pain in left knee: Secondary | ICD-10-CM | POA: Diagnosis not present

## 2016-12-08 DIAGNOSIS — M6281 Muscle weakness (generalized): Secondary | ICD-10-CM | POA: Diagnosis not present

## 2016-12-09 ENCOUNTER — Encounter (HOSPITAL_COMMUNITY): Payer: Self-pay | Admitting: Emergency Medicine

## 2016-12-09 ENCOUNTER — Ambulatory Visit (HOSPITAL_COMMUNITY)
Admission: EM | Admit: 2016-12-09 | Discharge: 2016-12-09 | Disposition: A | Discharge status: Home / Self Care | Payer: Medicare Other | Attending: Family Medicine | Admitting: Family Medicine

## 2016-12-09 DIAGNOSIS — R21 Rash and other nonspecific skin eruption: Secondary | ICD-10-CM | POA: Diagnosis not present

## 2016-12-09 MED ORDER — PREDNISONE 10 MG (48) PO TBPK: ORAL_TABLET | ORAL | 0 refills | Status: DC

## 2016-12-09 NOTE — ED Provider Notes (Signed)
[place]    CSN: [id number] Arrival date & time: 12/09/16  1847     History   Chief Complaint Chief Complaint  Patient presents with  . Rash    HPI Sheila Gardner is a 81 y.o. female.   81 yo presents with scalp itching. She was seen a few weeks back with a diagnosis of scabies. No scalp irritation at that time. She then developed symptoms in her scalp a week ago and was seen again and given hydroxyzine. She improved until recently and now the itching is keeping her awake. She has no additional rashes. Scabies rash is resolved. Her PCP refilled her hydroxyzine, but the itching remains. She has no known history of eczema or psoriasis. No patches or flakes have been noted. No new shampoos or hair stying creams.       Past Medical History:  Diagnosis Date  . Arthritis   . Breast cancer ([place])   . Cancer ([place])    Breast- rt  . Cough with sputum 2016  . GERD (gastroesophageal reflux disease)    COSTOCHONDRITIS  . Heart murmur   . Hyperlipidemia   . Hypertension   . Lipoma    back of neck  . PONV (postoperative nausea and vomiting)    only after rt hip surgery    Patient Active Problem List   Diagnosis Date Noted  . Primary osteoarthritis of left knee 11/04/2015  . Primary osteoarthritis of right hip 07/30/2014  . Hip pain 05/08/2014  . Elevated liver function tests 05/09/2013  . Vitamin D deficiency 03/25/2011  . Malignant neoplasm of upper-outer quadrant of right breast in female, estrogen receptor positive ([place]) 12/30/2010    Past Surgical History:  Procedure Laterality Date  . ANKLE FRACTURE SURGERY Right 2013   RIGHT   . BREAST SURGERY  04/22/2009   Rt lumpectomy  . EYE SURGERY  2001   macular hole  . JOINT REPLACEMENT  2016  . MIDDLE EAR SURGERY Left 08/18/2015   repair eardrum and reconstruction  . OVARY SURGERY  40 years ago   wedge  . TOTAL HIP ARTHROPLASTY Right 07/30/2014   Procedure: TOTAL HIP ARTHROPLASTY ANTERIOR APPROACH;   Surgeon: Melrose Nakayama, MD;  Location: [facility];  Service: Orthopedics;  Laterality: Right;  . TOTAL KNEE ARTHROPLASTY Left 11/04/2015  . TOTAL KNEE ARTHROPLASTY Left 11/04/2015   Procedure: TOTAL KNEE ARTHROPLASTY;  Surgeon: Melrose Nakayama, MD;  Location: [facility];  Service: Orthopedics;  Laterality: Left;  . TYMPANOPLASTY  30 years ago    OB History    No data available       Home Medications    Prior to Admission medications   Medication Sig Start Date End Date Taking? Authorizing Provider  aspirin EC 325 MG EC tablet Take 1 tablet (325 mg total) by mouth 2 (two) times daily after a meal. 11/06/15   Loni Dolly, PA-C  Cholecalciferol (VITAMIN D-3) 1000 UNITS CAPS Take 1 capsule by mouth daily.    [provider]  cyanocobalamin 500 MCG tablet Take 500 mcg by mouth daily.    [provider]  hydrOXYzine (ATARAX/VISTARIL) 25 MG tablet Take 1 tablet (25 mg total) by mouth every 6 (six) hours. 11/04/16   Lysbeth Penner, FNP  KLOR-CON 10 10 MEQ tablet Take 10 mEq by mouth 2 (two) times daily. 08/17/15   [provider]  losartan (COZAAR) 100 MG tablet Take 100 mg by mouth daily.     [provider]  omeprazole (  PRILOSEC) 20 MG capsule Take 20 mg by mouth daily.  12/25/10   [provider]  permethrin (ELIMITE) 5 % cream Apply to affected area once, can repeat treatment in 1 week. 08/10/16   Sherlene Shams, MD  polyethylene glycol [facility] / Floria Raveling) packet Take 17 g by mouth daily.    [provider]  predniSONE (STERAPRED UNI-PAK 48 TAB) 10 MG (48) TBPK tablet As directed 12/09/16   Bjorn Pippin, PA-C  pyrithione zinc (HEAD AND SHOULDERS) 1 % shampoo Apply topically daily as needed for itching. 11/04/16   Lysbeth Penner, FNP  traMADol (ULTRAM) 50 MG tablet Take 1 tablet (50 mg total) by mouth every 6 (six) hours as needed. pain 05/08/14   Boelter, Genelle Gather, NP  vitamin C (ASCORBIC ACID) 500 MG tablet Take 500 mg by mouth daily.     [provider]    Family History Family History  Problem Relation Age of Onset  . Cancer Brother        colon, pancreatic, brain    Social History Social History  Substance Use Topics  . Smoking status: Never Smoker  . Smokeless tobacco: Never Used  . Alcohol use Yes     Comment: OCC -none now     Allergies   No known allergies   Review of Systems Review of Systems  All other systems reviewed and are negative.    Physical Exam Triage Vital Signs ED Triage Vitals [12/09/16 1901]  Enc Vitals Group     BP (!) 121/55     Pulse Rate 72     Resp 18     Temp 97.9 F (36.6 C)     Temp Source Oral     SpO2 95 %     Weight      Height      Head Circumference      Peak Flow      Pain Score 8     Pain Loc      Pain Edu?      Excl. in [place]?    No data found.   Updated Vital Signs BP (!) 121/55 (BP Location: Left Arm)   Pulse 72   Temp 97.9 F (36.6 C) (Oral)   Resp 18   SpO2 95%   Visual Acuity Right Eye Distance:   Left Eye Distance:   Bilateral Distance:    Right Eye Near:   Left Eye Near:    Bilateral Near:     Physical Exam  Constitutional: She is oriented to person, place, and time. She appears well-developed and well-nourished. No distress.  Neurological: She is alert and oriented to person, place, and time.  Skin: Skin is warm and dry. She is not diaphoretic.  Scalp with erythematous region to posterior scalp without scales or patches. No plaques. No frank rash. No swelling.   Psychiatric: Her behavior is normal.  Nursing note and vitals reviewed.    UC Treatments / Results  Labs (all labs ordered are listed, but only abnormal results are displayed) Labs Reviewed - No data to display  EKG  EKG Interpretation None       Radiology No results found.  Procedures Procedures (including critical care time)  Medications Ordered in UC Medications - No data to display   Initial Impression / Assessment and Plan / UC Course    I have reviewed the triage vital signs and the nursing notes.  Pertinent labs & imaging results that were available during my  care of the patient were reviewed by me and considered in my medical decision making (see chart for details).     Scalp irritation in which etiology is unclear. No frank rash but more irritation from possible itching. Will treat acutely with prednisone pack, but if this returns she will need to have f/u with a dermatologist for further inspection and possibly biopsy. She expresses understanding.  Final Clinical Impressions(s) / UC Diagnoses   Final diagnoses:  Rash    New Prescriptions Discharge Medication List as of 12/09/2016  8:34 PM    START taking these medications   Details  predniSONE (STERAPRED UNI-PAK 48 TAB) 10 MG (48) TBPK tablet As directed, Print         Controlled Substance Prescriptions [place] Controlled Substance Registry consulted? Not Applicable   Prudencio Pair 12/09/16 2129

## 2016-12-09 NOTE — Discharge Instructions (Signed)
Scalp rash is unclear. No emergent findings. Will treat with prednisone pack, but if returns then would need to follow up with a Dermatologist for further evaluation. Hope this helps

## 2016-12-09 NOTE — ED Triage Notes (Signed)
Pt sts itchy rash to back of head; pt seen here for same in past

## 2016-12-29 DIAGNOSIS — L309 Dermatitis, unspecified: Secondary | ICD-10-CM | POA: Diagnosis not present

## 2017-01-07 DIAGNOSIS — E119 Type 2 diabetes mellitus without complications: Secondary | ICD-10-CM | POA: Diagnosis not present

## 2017-01-07 DIAGNOSIS — D72829 Elevated white blood cell count, unspecified: Secondary | ICD-10-CM | POA: Diagnosis not present

## 2017-01-07 DIAGNOSIS — I10 Essential (primary) hypertension: Secondary | ICD-10-CM | POA: Diagnosis not present

## 2017-01-07 DIAGNOSIS — E782 Mixed hyperlipidemia: Secondary | ICD-10-CM | POA: Diagnosis not present

## 2017-01-07 DIAGNOSIS — E559 Vitamin D deficiency, unspecified: Secondary | ICD-10-CM | POA: Diagnosis not present

## 2017-01-10 DIAGNOSIS — Z1389 Encounter for screening for other disorder: Secondary | ICD-10-CM | POA: Diagnosis not present

## 2017-01-10 DIAGNOSIS — E876 Hypokalemia: Secondary | ICD-10-CM | POA: Diagnosis not present

## 2017-01-10 DIAGNOSIS — E119 Type 2 diabetes mellitus without complications: Secondary | ICD-10-CM | POA: Diagnosis not present

## 2017-01-10 DIAGNOSIS — F411 Generalized anxiety disorder: Secondary | ICD-10-CM | POA: Diagnosis not present

## 2017-01-10 DIAGNOSIS — M179 Osteoarthritis of knee, unspecified: Secondary | ICD-10-CM | POA: Diagnosis not present

## 2017-01-10 DIAGNOSIS — R350 Frequency of micturition: Secondary | ICD-10-CM | POA: Diagnosis not present

## 2017-01-10 DIAGNOSIS — Z23 Encounter for immunization: Secondary | ICD-10-CM | POA: Diagnosis not present

## 2017-01-10 DIAGNOSIS — I10 Essential (primary) hypertension: Secondary | ICD-10-CM | POA: Diagnosis not present

## 2017-01-10 DIAGNOSIS — R1013 Epigastric pain: Secondary | ICD-10-CM | POA: Diagnosis not present

## 2017-01-10 DIAGNOSIS — E782 Mixed hyperlipidemia: Secondary | ICD-10-CM | POA: Diagnosis not present

## 2017-01-10 DIAGNOSIS — Z0000 Encounter for general adult medical examination without abnormal findings: Secondary | ICD-10-CM | POA: Diagnosis not present

## 2017-01-10 DIAGNOSIS — H019 Unspecified inflammation of eyelid: Secondary | ICD-10-CM | POA: Diagnosis not present

## 2017-02-08 DIAGNOSIS — H6983 Other specified disorders of Eustachian tube, bilateral: Secondary | ICD-10-CM | POA: Diagnosis not present

## 2017-02-08 DIAGNOSIS — H903 Sensorineural hearing loss, bilateral: Secondary | ICD-10-CM | POA: Diagnosis not present

## 2017-02-08 DIAGNOSIS — H9313 Tinnitus, bilateral: Secondary | ICD-10-CM | POA: Diagnosis not present

## 2017-03-21 ENCOUNTER — Other Ambulatory Visit: Payer: Self-pay | Admitting: Family Medicine

## 2017-03-21 DIAGNOSIS — Z1231 Encounter for screening mammogram for malignant neoplasm of breast: Secondary | ICD-10-CM

## 2017-04-28 ENCOUNTER — Encounter (HOSPITAL_COMMUNITY): Payer: Self-pay | Admitting: Emergency Medicine

## 2017-04-28 ENCOUNTER — Ambulatory Visit (HOSPITAL_COMMUNITY)
Admission: EM | Admit: 2017-04-28 | Discharge: 2017-04-28 | Disposition: A | Discharge status: Home / Self Care | Payer: Medicare Other | Attending: Family Medicine | Admitting: Family Medicine

## 2017-04-28 ENCOUNTER — Other Ambulatory Visit: Payer: Self-pay

## 2017-04-28 DIAGNOSIS — H9202 Otalgia, left ear: Secondary | ICD-10-CM

## 2017-04-28 MED ORDER — PREDNISONE 10 MG (21) PO TBPK: ORAL_TABLET | Freq: Every day | ORAL | 0 refills | Status: DC

## 2017-04-28 NOTE — Discharge Instructions (Signed)
Please keep you ENT follow up appointment. You may follow up here if needed.

## 2017-04-28 NOTE — ED Provider Notes (Signed)
  [place]   [id number] 04/28/17 Arrival Time: 5427  ASSESSMENT & PLAN:  1. Left ear pain     Meds ordered this encounter  Medications  . predniSONE (STERAPRED UNI-PAK 21 TAB) 10 MG (21) TBPK tablet    Sig: Take by mouth daily. Take as directed.    Dispense:  21 tablet    Refill:  0   Has ENT f/u scheduled after the weekend. No obvious infection. Trial of prednisone.  Reviewed expectations re: course of current medical issues. Questions answered. Outlined signs and symptoms indicating need for more acute intervention. Patient verbalized understanding. After Visit Summary given.   SUBJECTIVE: History from: patient.  Sheila Gardner is a 82 y.o. female who presents with complaint of left otalgia without drainage. Onset gradual, approximately a few days ago. Stable and intermittent. No specific aggravating or alleviating factors reported. Recent cold symptoms: none. Fever: no. Overall normal PO intake without n/v. Sick contacts: no. OTC treatment: none.  Social History   Tobacco Use  Smoking Status Never Smoker  Smokeless Tobacco Never Used    ROS: As per HPI.   OBJECTIVE:  Vitals:   04/28/17 1723 04/28/17 1724  BP: (!) 152/84   Pulse: 61   Resp: 16   Temp: 97.9 F (36.6 C)   TempSrc: Oral   SpO2: 96%   Weight:  188 lb (85.3 kg)  Height:  5\' 4"  (1.626 m)     General appearance: alert; appears fatigued Ear Canal: L with mild inflammation TM: left: scars present; mild superior erythema but no obvious infection Neck: supple without LAD Lungs: unlabored respirations, symmetrical air entry; no respiratory distress Skin: warm and dry Psychological: alert and cooperative; normal mood and affect  Allergies  Allergen Reactions  . No Known Allergies     Past Medical History:  Diagnosis Date  . Arthritis   . Breast cancer ([place])   . Cancer ([place])    Breast- rt  . Cough with sputum 2016  . GERD (gastroesophageal reflux disease)    COSTOCHONDRITIS    . Heart murmur   . Hyperlipidemia   . Hypertension   . Lipoma    back of neck  . PONV (postoperative nausea and vomiting)    only after rt hip surgery   Family History  Problem Relation Age of Onset  . Cancer Brother        colon, pancreatic, brain   Social History   Socioeconomic History  . Marital status: Widowed    Spouse name: Not on file  . Number of children: Not on file  . Years of education: Not on file  . Highest education level: Not on file  Social Needs  . Financial resource strain: Not on file  . Food insecurity - worry: Not on file  . Food insecurity - inability: Not on file  . Transportation needs - medical: Not on file  . Transportation needs - non-medical: Not on file  Occupational History  . Not on file  Tobacco Use  . Smoking status: Never Smoker  . Smokeless tobacco: Never Used  Substance and Sexual Activity  . Alcohol use: Yes    Comment: OCC -none now  . Drug use: No  . Sexual activity: Not on file  Other Topics Concern  . Not on file  Social History Narrative  . Not on file            Vanessa Kick, MD 05/02/17 [phone]

## 2017-04-28 NOTE — ED Triage Notes (Signed)
Left ear pain, headache pain. Ear pain started Sunday while patient was taking a shower

## 2017-05-03 DIAGNOSIS — H7202 Central perforation of tympanic membrane, left ear: Secondary | ICD-10-CM | POA: Diagnosis not present

## 2017-05-03 DIAGNOSIS — H90A31 Mixed conductive and sensorineural hearing loss, unilateral, right ear with restricted hearing on the contralateral side: Secondary | ICD-10-CM | POA: Diagnosis not present

## 2017-05-03 DIAGNOSIS — H9313 Tinnitus, bilateral: Secondary | ICD-10-CM | POA: Diagnosis not present

## 2017-05-03 DIAGNOSIS — H6983 Other specified disorders of Eustachian tube, bilateral: Secondary | ICD-10-CM | POA: Diagnosis not present

## 2017-05-04 ENCOUNTER — Ambulatory Visit
Admission: RE | Admit: 2017-05-04 | Discharge: 2017-05-04 | Disposition: A | Discharge status: Home / Self Care | Payer: Medicare Other | Source: Ambulatory Visit | Attending: Family Medicine | Admitting: Family Medicine

## 2017-05-04 DIAGNOSIS — Z1231 Encounter for screening mammogram for malignant neoplasm of breast: Secondary | ICD-10-CM

## 2017-06-03 DIAGNOSIS — E119 Type 2 diabetes mellitus without complications: Secondary | ICD-10-CM | POA: Diagnosis not present

## 2017-08-23 DIAGNOSIS — H7312 Chronic myringitis, left ear: Secondary | ICD-10-CM | POA: Diagnosis not present

## 2017-08-23 DIAGNOSIS — H6983 Other specified disorders of Eustachian tube, bilateral: Secondary | ICD-10-CM | POA: Diagnosis not present

## 2017-08-23 DIAGNOSIS — H90A31 Mixed conductive and sensorineural hearing loss, unilateral, right ear with restricted hearing on the contralateral side: Secondary | ICD-10-CM | POA: Diagnosis not present

## 2017-08-23 DIAGNOSIS — H9313 Tinnitus, bilateral: Secondary | ICD-10-CM | POA: Diagnosis not present

## 2017-09-01 DIAGNOSIS — R0981 Nasal congestion: Secondary | ICD-10-CM | POA: Diagnosis not present

## 2017-09-01 DIAGNOSIS — Z9889 Other specified postprocedural states: Secondary | ICD-10-CM | POA: Diagnosis not present

## 2017-09-21 DIAGNOSIS — H73892 Other specified disorders of tympanic membrane, left ear: Secondary | ICD-10-CM | POA: Diagnosis not present

## 2017-09-21 DIAGNOSIS — H7202 Central perforation of tympanic membrane, left ear: Secondary | ICD-10-CM | POA: Diagnosis not present

## 2017-09-21 DIAGNOSIS — H90A31 Mixed conductive and sensorineural hearing loss, unilateral, right ear with restricted hearing on the contralateral side: Secondary | ICD-10-CM | POA: Diagnosis not present

## 2017-09-21 DIAGNOSIS — H9313 Tinnitus, bilateral: Secondary | ICD-10-CM | POA: Diagnosis not present

## 2017-09-21 DIAGNOSIS — H6983 Other specified disorders of Eustachian tube, bilateral: Secondary | ICD-10-CM | POA: Diagnosis not present

## 2017-10-17 DIAGNOSIS — H90A31 Mixed conductive and sensorineural hearing loss, unilateral, right ear with restricted hearing on the contralateral side: Secondary | ICD-10-CM | POA: Diagnosis not present

## 2017-10-17 DIAGNOSIS — H6983 Other specified disorders of Eustachian tube, bilateral: Secondary | ICD-10-CM | POA: Diagnosis not present

## 2017-10-17 DIAGNOSIS — H73892 Other specified disorders of tympanic membrane, left ear: Secondary | ICD-10-CM | POA: Diagnosis not present

## 2017-10-17 DIAGNOSIS — H9313 Tinnitus, bilateral: Secondary | ICD-10-CM | POA: Diagnosis not present

## 2017-10-17 DIAGNOSIS — H7312 Chronic myringitis, left ear: Secondary | ICD-10-CM | POA: Diagnosis not present

## 2017-10-31 ENCOUNTER — Other Ambulatory Visit: Payer: Self-pay | Admitting: Otolaryngology

## 2017-10-31 DIAGNOSIS — H7312 Chronic myringitis, left ear: Secondary | ICD-10-CM

## 2017-11-25 ENCOUNTER — Ambulatory Visit
Admission: RE | Admit: 2017-11-25 | Discharge: 2017-11-25 | Disposition: A | Discharge status: Home / Self Care | Payer: Medicare Other | Source: Ambulatory Visit | Attending: Otolaryngology | Admitting: Otolaryngology

## 2017-11-25 DIAGNOSIS — H7322 Unspecified myringitis, left ear: Secondary | ICD-10-CM | POA: Diagnosis not present

## 2017-11-25 DIAGNOSIS — H7312 Chronic myringitis, left ear: Secondary | ICD-10-CM

## 2017-11-25 MED ORDER — IOPAMIDOL (ISOVUE-300) INJECTION 61%
75.0000 mL | Freq: Once | INTRAVENOUS | Status: AC | PRN
  Administered 2017-11-25: 75 mL via INTRAVENOUS

## 2018-01-09 DIAGNOSIS — M79642 Pain in left hand: Secondary | ICD-10-CM | POA: Diagnosis not present

## 2018-01-09 DIAGNOSIS — G5603 Carpal tunnel syndrome, bilateral upper limbs: Secondary | ICD-10-CM | POA: Diagnosis not present

## 2018-01-09 DIAGNOSIS — M79641 Pain in right hand: Secondary | ICD-10-CM | POA: Diagnosis not present

## 2018-01-09 DIAGNOSIS — M13849 Other specified arthritis, unspecified hand: Secondary | ICD-10-CM | POA: Diagnosis not present

## 2018-01-12 DIAGNOSIS — E782 Mixed hyperlipidemia: Secondary | ICD-10-CM | POA: Diagnosis not present

## 2018-01-12 DIAGNOSIS — E559 Vitamin D deficiency, unspecified: Secondary | ICD-10-CM | POA: Diagnosis not present

## 2018-01-12 DIAGNOSIS — Z23 Encounter for immunization: Secondary | ICD-10-CM | POA: Diagnosis not present

## 2018-01-12 DIAGNOSIS — E1169 Type 2 diabetes mellitus with other specified complication: Secondary | ICD-10-CM | POA: Diagnosis not present

## 2018-01-12 DIAGNOSIS — I10 Essential (primary) hypertension: Secondary | ICD-10-CM | POA: Diagnosis not present

## 2018-01-12 DIAGNOSIS — D72829 Elevated white blood cell count, unspecified: Secondary | ICD-10-CM | POA: Diagnosis not present

## 2018-01-12 DIAGNOSIS — L659 Nonscarring hair loss, unspecified: Secondary | ICD-10-CM | POA: Diagnosis not present

## 2018-01-16 DIAGNOSIS — R351 Nocturia: Secondary | ICD-10-CM | POA: Diagnosis not present

## 2018-01-16 DIAGNOSIS — I10 Essential (primary) hypertension: Secondary | ICD-10-CM | POA: Diagnosis not present

## 2018-01-16 DIAGNOSIS — F411 Generalized anxiety disorder: Secondary | ICD-10-CM | POA: Diagnosis not present

## 2018-01-16 DIAGNOSIS — E782 Mixed hyperlipidemia: Secondary | ICD-10-CM | POA: Diagnosis not present

## 2018-02-14 DIAGNOSIS — H838X3 Other specified diseases of inner ear, bilateral: Secondary | ICD-10-CM | POA: Diagnosis not present

## 2018-02-14 DIAGNOSIS — H9072 Mixed conductive and sensorineural hearing loss, unilateral, left ear, with unrestricted hearing on the contralateral side: Secondary | ICD-10-CM | POA: Diagnosis not present

## 2018-02-14 DIAGNOSIS — H6122 Impacted cerumen, left ear: Secondary | ICD-10-CM | POA: Diagnosis not present

## 2018-02-23 DIAGNOSIS — M25562 Pain in left knee: Secondary | ICD-10-CM | POA: Diagnosis not present

## 2018-03-06 ENCOUNTER — Other Ambulatory Visit (HOSPITAL_COMMUNITY): Payer: Self-pay | Admitting: Student

## 2018-03-06 ENCOUNTER — Other Ambulatory Visit: Payer: Self-pay | Admitting: Student

## 2018-03-06 DIAGNOSIS — Z96652 Presence of left artificial knee joint: Secondary | ICD-10-CM

## 2018-03-10 ENCOUNTER — Encounter (HOSPITAL_COMMUNITY)
Admission: RE | Admit: 2018-03-10 | Discharge: 2018-03-10 | Disposition: A | Discharge status: Home / Self Care | Payer: Medicare Other | Source: Ambulatory Visit | Attending: Student | Admitting: Student

## 2018-03-10 DIAGNOSIS — Z96652 Presence of left artificial knee joint: Secondary | ICD-10-CM

## 2018-03-10 DIAGNOSIS — M25562 Pain in left knee: Secondary | ICD-10-CM | POA: Diagnosis not present

## 2018-03-10 MED ORDER — TECHNETIUM TC 99M MEDRONATE IV KIT
22.0000 | PACK | Freq: Once | INTRAVENOUS | Status: AC | PRN
  Administered 2018-03-10: 22 via INTRAVENOUS

## 2018-03-12 ENCOUNTER — Encounter (HOSPITAL_COMMUNITY): Payer: Self-pay | Admitting: Emergency Medicine

## 2018-03-12 ENCOUNTER — Ambulatory Visit (HOSPITAL_COMMUNITY)
Admission: EM | Admit: 2018-03-12 | Discharge: 2018-03-12 | Disposition: A | Discharge status: Home / Self Care | Payer: Medicare Other | Attending: Family Medicine | Admitting: Family Medicine

## 2018-03-12 DIAGNOSIS — N3001 Acute cystitis with hematuria: Secondary | ICD-10-CM | POA: Diagnosis not present

## 2018-03-12 LAB — POCT URINALYSIS DIP (DEVICE)
GLUCOSE, UA: NEGATIVE mg/dL
NITRITE: NEGATIVE
PH: 7 (ref 5.0–8.0)
Specific Gravity, Urine: 1.02 (ref 1.005–1.030)
UROBILINOGEN UA: 1 mg/dL (ref 0.0–1.0)

## 2018-03-12 MED ORDER — CEPHALEXIN 500 MG PO CAPS: 500.0000 mg | ORAL_CAPSULE | Freq: Two times a day (BID) | ORAL | 0 refills | Status: DC

## 2018-03-12 NOTE — ED Triage Notes (Signed)
Pt c/o burning with urination and blood in her urine for the last few days.

## 2018-03-12 NOTE — ED Provider Notes (Signed)
[place]    CSN: [id number] Arrival date & time: 03/12/18  1734     History   Chief Complaint Chief Complaint  Patient presents with  . Hematuria    HPI Sheila Gardner is a 83 y.o. female.   83 year old female comes in for few day history of urinary symptoms.  Has had dysuria, frequency, and noticed hematuria today.  Denies abdominal pain, nausea, vomiting.  Denies fever, chills, night sweats.  Denies vaginal discharge, itching.  Has not tried anything for the symptoms.     Past Medical History:  Diagnosis Date  . Arthritis   . Breast cancer ([place])   . Cancer ([place])    Breast- rt  . Cough with sputum 2016  . GERD (gastroesophageal reflux disease)    COSTOCHONDRITIS  . Heart murmur   . Hyperlipidemia   . Hypertension   . Lipoma    back of neck  . PONV (postoperative nausea and vomiting)    only after rt hip surgery    Patient Active Problem List   Diagnosis Date Noted  . Primary osteoarthritis of left knee 11/04/2015  . Primary osteoarthritis of right hip 07/30/2014  . Hip pain 05/08/2014  . Elevated liver function tests 05/09/2013  . Vitamin D deficiency 03/25/2011  . Malignant neoplasm of upper-outer quadrant of right breast in female, estrogen receptor positive ([place]) 12/30/2010    Past Surgical History:  Procedure Laterality Date  . ANKLE FRACTURE SURGERY Right 2013   RIGHT   . BREAST LUMPECTOMY Right 2011  . BREAST SURGERY  04/22/2009   Rt lumpectomy  . EYE SURGERY  2001   macular hole  . JOINT REPLACEMENT  2016  . MIDDLE EAR SURGERY Left 08/18/2015   repair eardrum and reconstruction  . OVARY SURGERY  40 years ago   wedge  . TOTAL HIP ARTHROPLASTY Right 07/30/2014   Procedure: TOTAL HIP ARTHROPLASTY ANTERIOR APPROACH;  Surgeon: Melrose Nakayama, MD;  Location: [facility];  Service: Orthopedics;  Laterality: Right;  . TOTAL KNEE ARTHROPLASTY Left 11/04/2015  . TOTAL KNEE ARTHROPLASTY Left 11/04/2015   Procedure: TOTAL KNEE ARTHROPLASTY;   Surgeon: Melrose Nakayama, MD;  Location: [facility];  Service: Orthopedics;  Laterality: Left;  . TYMPANOPLASTY  30 years ago    OB History   No obstetric history on file.      Home Medications    Prior to Admission medications   Medication Sig Start Date End Date Taking? Authorizing Provider  Cholecalciferol (VITAMIN D-3) 1000 UNITS CAPS Take 1 capsule by mouth daily.   Yes [provider]  LORazepam (ATIVAN) 1 MG tablet Take 1 mg by mouth every 8 (eight) hours.   Yes [provider]  oxybutynin (DITROPAN) 5 MG tablet Take 5 mg by mouth 3 (three) times daily.   Yes [provider]  aspirin EC 325 MG EC tablet Take 1 tablet (325 mg total) by mouth 2 (two) times daily after a meal. Patient not taking: Reported on 03/12/2018 11/06/15   Loni Dolly, PA-C  cephALEXin (KEFLEX) 500 MG capsule Take 1 capsule (500 mg total) by mouth 2 (two) times daily. 03/12/18   Tasia Catchings,  V, PA-C  cyanocobalamin 500 MCG tablet Take 500 mcg by mouth daily.    [provider]  hydrOXYzine (ATARAX/VISTARIL) 25 MG tablet Take 1 tablet (25 mg total) by mouth every 6 (six) hours. Patient not taking: Reported on 03/12/2018 11/04/16   Lysbeth Penner, FNP  KLOR-CON 10 10 MEQ tablet Take 10  mEq by mouth 2 (two) times daily. 08/17/15   [provider]  losartan (COZAAR) 100 MG tablet Take 100 mg by mouth daily.     [provider]  omeprazole (PRILOSEC) 20 MG capsule Take 20 mg by mouth daily.  12/25/10   [provider]  polyethylene glycol (MIRALAX / GLYCOLAX) packet Take 17 g by mouth daily.    [provider]  predniSONE (STERAPRED UNI-PAK 21 TAB) 10 MG (21) TBPK tablet Take by mouth daily. Take as directed. Patient not taking: Reported on 03/12/2018 04/28/17   Vanessa Kick, MD  pyrithione zinc (HEAD AND SHOULDERS) 1 % shampoo Apply topically daily as needed for itching. 11/04/16   Lysbeth Penner, FNP  traMADol (ULTRAM) 50 MG tablet Take 1 tablet (50 mg  total) by mouth every 6 (six) hours as needed. pain Patient not taking: Reported on 03/12/2018 05/08/14   Boelter, Genelle Gather, NP  vitamin C (ASCORBIC ACID) 500 MG tablet Take 500 mg by mouth daily.    [provider]    Family History Family History  Problem Relation Age of Onset  . Cancer Brother        colon, pancreatic, brain    Social History Social History   Tobacco Use  . Smoking status: Never Smoker  . Smokeless tobacco: Never Used  Substance Use Topics  . Alcohol use: Yes    Comment: OCC -none now  . Drug use: No     Allergies   No known allergies   Review of Systems Review of Systems  Reason unable to perform ROS: See HPI as above.     Physical Exam Triage Vital Signs ED Triage Vitals  Enc Vitals Group     BP 03/12/18 1829 (!) 147/59     Pulse Rate 03/12/18 1829 67     Resp 03/12/18 1829 14     Temp 03/12/18 1829 98.5 F (36.9 C)     Temp src --      SpO2 03/12/18 1829 97 %     Weight --      Height --      Head Circumference --      Peak Flow --      Pain Score 03/12/18 1830 5     Pain Loc --      Pain Edu? --      Excl. in [place]? --    No data found.  Updated Vital Signs BP (!) 147/59   Pulse 67   Temp 98.5 F (36.9 C)   Resp 14   SpO2 97%   Physical Exam Constitutional:      General: She is not in acute distress.    Appearance: She is well-developed.  HENT:     Head: Normocephalic and atraumatic.  Eyes:     Conjunctiva/sclera: Conjunctivae normal.     Pupils: Pupils are equal, round, and reactive to light.  Cardiovascular:     Rate and Rhythm: Normal rate and regular rhythm.     Heart sounds: Normal heart sounds. No murmur. No friction rub. No gallop.   Pulmonary:     Effort: Pulmonary effort is normal.     Breath sounds: Normal breath sounds. No wheezing or rales.  Abdominal:     General: Bowel sounds are normal.     Palpations: Abdomen is soft. There is no mass.     Tenderness: There is no abdominal tenderness. There  is no guarding or rebound.  Skin:    General: Skin  is warm and dry.  Neurological:     Mental Status: She is alert and oriented to person, place, and time.  Psychiatric:        Behavior: Behavior normal.        Judgment: Judgment normal.      UC Treatments / Results  Labs (all labs ordered are listed, but only abnormal results are displayed) Labs Reviewed  POCT URINALYSIS DIP (DEVICE) - Abnormal; Notable for the following components:      Result Value   Bilirubin Urine SMALL (*)    Ketones, ur TRACE (*)    Hgb urine dipstick LARGE (*)    Protein, ur >=300 (*)    Leukocytes, UA LARGE (*)    All other components within normal limits  URINE CULTURE    EKG None  Radiology No results found.  Procedures Procedures (including critical care time)  Medications Ordered in UC Medications - No data to display  Initial Impression / Assessment and Plan / UC Course  I have reviewed the triage vital signs and the nursing notes.  Pertinent labs & imaging results that were available during my care of the patient were reviewed by me and considered in my medical decision making (see chart for details).    Urine dipstick positive for UTI. Start antibiotics as directed. Push fluids. Return precautions given.  Final Clinical Impressions(s) / UC Diagnoses   Final diagnoses:  Acute cystitis with hematuria    ED Prescriptions    Medication Sig Dispense Auth. Provider   cephALEXin (KEFLEX) 500 MG capsule Take 1 capsule (500 mg total) by mouth 2 (two) times daily. 14 capsule Tobin Chad, [place] 03/12/18 1926

## 2018-03-12 NOTE — Discharge Instructions (Signed)
Your urine was positive for an urinary tract infection. Start keflex as directed. Keep hydrated, urine should be clear to pale yellow in color. Monitor for any worsening of symptoms, fever, worsening abdominal pain, nausea/vomiting, flank pain, follow up for reevaluation. Follow up with PCP in 7-10 days for urine recheck.

## 2018-03-15 ENCOUNTER — Telehealth (HOSPITAL_COMMUNITY): Payer: Self-pay | Admitting: Emergency Medicine

## 2018-03-15 LAB — URINE CULTURE: Culture: >=100000 — AB

## 2018-03-15 NOTE — Telephone Encounter (Signed)
Urine culture was positive for e coli and was given keflex at urgent care visit. Pt contacted and made aware, educated on completing antibiotic and to follow up if symptoms are persistent. Verbalized understanding.    

## 2018-03-17 DIAGNOSIS — E538 Deficiency of other specified B group vitamins: Secondary | ICD-10-CM | POA: Diagnosis not present

## 2018-03-17 DIAGNOSIS — N390 Urinary tract infection, site not specified: Secondary | ICD-10-CM | POA: Diagnosis not present

## 2018-03-17 DIAGNOSIS — L659 Nonscarring hair loss, unspecified: Secondary | ICD-10-CM | POA: Diagnosis not present

## 2018-03-17 DIAGNOSIS — E1169 Type 2 diabetes mellitus with other specified complication: Secondary | ICD-10-CM | POA: Diagnosis not present

## 2018-03-17 DIAGNOSIS — Z09 Encounter for follow-up examination after completed treatment for conditions other than malignant neoplasm: Secondary | ICD-10-CM | POA: Diagnosis not present

## 2018-03-17 DIAGNOSIS — R300 Dysuria: Secondary | ICD-10-CM | POA: Diagnosis not present

## 2018-03-17 DIAGNOSIS — R8290 Unspecified abnormal findings in urine: Secondary | ICD-10-CM | POA: Diagnosis not present

## 2018-03-21 DIAGNOSIS — H9072 Mixed conductive and sensorineural hearing loss, unilateral, left ear, with unrestricted hearing on the contralateral side: Secondary | ICD-10-CM | POA: Diagnosis not present

## 2018-03-22 ENCOUNTER — Other Ambulatory Visit: Payer: Self-pay | Admitting: Family Medicine

## 2018-03-22 DIAGNOSIS — Z1231 Encounter for screening mammogram for malignant neoplasm of breast: Secondary | ICD-10-CM

## 2018-03-31 DIAGNOSIS — H7202 Central perforation of tympanic membrane, left ear: Secondary | ICD-10-CM | POA: Diagnosis not present

## 2018-03-31 DIAGNOSIS — T85698A Other mechanical complication of other specified internal prosthetic devices, implants and grafts, initial encounter: Secondary | ICD-10-CM | POA: Diagnosis not present

## 2018-03-31 DIAGNOSIS — H7422 Discontinuity and dislocation of left ear ossicles: Secondary | ICD-10-CM | POA: Diagnosis not present

## 2018-04-17 ENCOUNTER — Other Ambulatory Visit: Payer: Self-pay | Admitting: Family Medicine

## 2018-04-17 DIAGNOSIS — Z1231 Encounter for screening mammogram for malignant neoplasm of breast: Secondary | ICD-10-CM

## 2018-04-18 DIAGNOSIS — R110 Nausea: Secondary | ICD-10-CM | POA: Diagnosis not present

## 2018-04-18 DIAGNOSIS — Z853 Personal history of malignant neoplasm of breast: Secondary | ICD-10-CM | POA: Diagnosis not present

## 2018-04-18 DIAGNOSIS — N644 Mastodynia: Secondary | ICD-10-CM | POA: Diagnosis not present

## 2018-04-18 DIAGNOSIS — N390 Urinary tract infection, site not specified: Secondary | ICD-10-CM | POA: Diagnosis not present

## 2018-04-19 ENCOUNTER — Other Ambulatory Visit: Payer: Self-pay | Admitting: Family Medicine

## 2018-04-20 ENCOUNTER — Other Ambulatory Visit: Payer: Self-pay | Admitting: Family Medicine

## 2018-04-20 DIAGNOSIS — N644 Mastodynia: Secondary | ICD-10-CM

## 2018-04-21 DIAGNOSIS — Z96652 Presence of left artificial knee joint: Secondary | ICD-10-CM | POA: Diagnosis not present

## 2018-04-21 DIAGNOSIS — T84023D Instability of internal left knee prosthesis, subsequent encounter: Secondary | ICD-10-CM | POA: Diagnosis not present

## 2018-05-02 DIAGNOSIS — M9901 Segmental and somatic dysfunction of cervical region: Secondary | ICD-10-CM | POA: Diagnosis not present

## 2018-05-02 DIAGNOSIS — M5442 Lumbago with sciatica, left side: Secondary | ICD-10-CM | POA: Diagnosis not present

## 2018-05-02 DIAGNOSIS — M9905 Segmental and somatic dysfunction of pelvic region: Secondary | ICD-10-CM | POA: Diagnosis not present

## 2018-05-02 DIAGNOSIS — M5136 Other intervertebral disc degeneration, lumbar region: Secondary | ICD-10-CM | POA: Diagnosis not present

## 2018-05-04 DIAGNOSIS — M9905 Segmental and somatic dysfunction of pelvic region: Secondary | ICD-10-CM | POA: Diagnosis not present

## 2018-05-04 DIAGNOSIS — M5136 Other intervertebral disc degeneration, lumbar region: Secondary | ICD-10-CM | POA: Diagnosis not present

## 2018-05-04 DIAGNOSIS — M9901 Segmental and somatic dysfunction of cervical region: Secondary | ICD-10-CM | POA: Diagnosis not present

## 2018-05-04 DIAGNOSIS — M5442 Lumbago with sciatica, left side: Secondary | ICD-10-CM | POA: Diagnosis not present

## 2018-05-05 ENCOUNTER — Ambulatory Visit
Admission: RE | Admit: 2018-05-05 | Discharge: 2018-05-05 | Disposition: A | Discharge status: Home / Self Care | Payer: Medicare Other | Source: Ambulatory Visit | Attending: Family Medicine | Admitting: Family Medicine

## 2018-05-05 DIAGNOSIS — N644 Mastodynia: Secondary | ICD-10-CM

## 2018-05-05 DIAGNOSIS — R922 Inconclusive mammogram: Secondary | ICD-10-CM | POA: Diagnosis not present

## 2018-05-05 DIAGNOSIS — Z853 Personal history of malignant neoplasm of breast: Secondary | ICD-10-CM | POA: Diagnosis not present

## 2018-05-16 ENCOUNTER — Ambulatory Visit: Payer: Medicare Other

## 2018-07-11 DIAGNOSIS — H6122 Impacted cerumen, left ear: Secondary | ICD-10-CM | POA: Diagnosis not present

## 2018-07-11 DIAGNOSIS — H838X3 Other specified diseases of inner ear, bilateral: Secondary | ICD-10-CM | POA: Diagnosis not present

## 2018-07-11 DIAGNOSIS — H7202 Central perforation of tympanic membrane, left ear: Secondary | ICD-10-CM | POA: Diagnosis not present

## 2018-07-11 DIAGNOSIS — H903 Sensorineural hearing loss, bilateral: Secondary | ICD-10-CM | POA: Diagnosis not present

## 2018-08-09 DIAGNOSIS — M79642 Pain in left hand: Secondary | ICD-10-CM | POA: Diagnosis not present

## 2018-08-09 DIAGNOSIS — G5603 Carpal tunnel syndrome, bilateral upper limbs: Secondary | ICD-10-CM | POA: Diagnosis not present

## 2018-08-09 DIAGNOSIS — M13849 Other specified arthritis, unspecified hand: Secondary | ICD-10-CM | POA: Diagnosis not present

## 2018-08-09 DIAGNOSIS — M79641 Pain in right hand: Secondary | ICD-10-CM | POA: Diagnosis not present

## 2018-08-10 DIAGNOSIS — E559 Vitamin D deficiency, unspecified: Secondary | ICD-10-CM | POA: Diagnosis not present

## 2018-08-10 DIAGNOSIS — E876 Hypokalemia: Secondary | ICD-10-CM | POA: Diagnosis not present

## 2018-08-10 DIAGNOSIS — I10 Essential (primary) hypertension: Secondary | ICD-10-CM | POA: Diagnosis not present

## 2018-08-10 DIAGNOSIS — E782 Mixed hyperlipidemia: Secondary | ICD-10-CM | POA: Diagnosis not present

## 2018-08-10 DIAGNOSIS — E1169 Type 2 diabetes mellitus with other specified complication: Secondary | ICD-10-CM | POA: Diagnosis not present

## 2018-08-21 DIAGNOSIS — E1169 Type 2 diabetes mellitus with other specified complication: Secondary | ICD-10-CM | POA: Diagnosis not present

## 2018-08-21 DIAGNOSIS — F411 Generalized anxiety disorder: Secondary | ICD-10-CM | POA: Diagnosis not present

## 2018-08-21 DIAGNOSIS — E559 Vitamin D deficiency, unspecified: Secondary | ICD-10-CM | POA: Diagnosis not present

## 2018-08-21 DIAGNOSIS — I10 Essential (primary) hypertension: Secondary | ICD-10-CM | POA: Diagnosis not present

## 2018-09-05 DIAGNOSIS — R197 Diarrhea, unspecified: Secondary | ICD-10-CM | POA: Diagnosis not present

## 2018-09-05 DIAGNOSIS — Z20828 Contact with and (suspected) exposure to other viral communicable diseases: Secondary | ICD-10-CM | POA: Diagnosis not present

## 2018-09-05 DIAGNOSIS — R110 Nausea: Secondary | ICD-10-CM | POA: Diagnosis not present

## 2018-09-05 DIAGNOSIS — J069 Acute upper respiratory infection, unspecified: Secondary | ICD-10-CM | POA: Diagnosis not present

## 2018-10-05 ENCOUNTER — Emergency Department (HOSPITAL_COMMUNITY)
Admission: EM | Admit: 2018-10-05 | Discharge: 2018-10-05 | Disposition: A | Discharge status: Home / Self Care | Payer: Medicare Other | Attending: Emergency Medicine | Admitting: Emergency Medicine

## 2018-10-05 ENCOUNTER — Other Ambulatory Visit: Payer: Self-pay

## 2018-10-05 ENCOUNTER — Emergency Department (HOSPITAL_COMMUNITY): Payer: Medicare Other

## 2018-10-05 DIAGNOSIS — R110 Nausea: Secondary | ICD-10-CM | POA: Insufficient documentation

## 2018-10-05 DIAGNOSIS — R001 Bradycardia, unspecified: Secondary | ICD-10-CM | POA: Diagnosis not present

## 2018-10-05 DIAGNOSIS — R531 Weakness: Secondary | ICD-10-CM | POA: Diagnosis not present

## 2018-10-05 DIAGNOSIS — R0902 Hypoxemia: Secondary | ICD-10-CM | POA: Diagnosis not present

## 2018-10-05 DIAGNOSIS — R42 Dizziness and giddiness: Secondary | ICD-10-CM | POA: Diagnosis not present

## 2018-10-05 DIAGNOSIS — I4510 Unspecified right bundle-branch block: Secondary | ICD-10-CM | POA: Diagnosis not present

## 2018-10-05 DIAGNOSIS — R51 Headache: Secondary | ICD-10-CM | POA: Diagnosis not present

## 2018-10-05 DIAGNOSIS — I10 Essential (primary) hypertension: Secondary | ICD-10-CM | POA: Diagnosis not present

## 2018-10-05 LAB — URINALYSIS, ROUTINE W REFLEX MICROSCOPIC
Bilirubin Urine: NEGATIVE
Glucose, UA: NEGATIVE mg/dL
Hgb urine dipstick: NEGATIVE
Ketones, ur: NEGATIVE mg/dL
Leukocytes,Ua: NEGATIVE
Nitrite: NEGATIVE
Protein, ur: NEGATIVE mg/dL
Specific Gravity, Urine: 1.003 — ABNORMAL LOW (ref 1.005–1.030)
pH: 8 (ref 5.0–8.0)

## 2018-10-05 LAB — COMPREHENSIVE METABOLIC PANEL
ALT: 17 U/L (ref 0–44)
AST: 29 U/L (ref 15–41)
Albumin: 4 g/dL (ref 3.5–5.0)
Alkaline Phosphatase: 52 U/L (ref 38–126)
Anion gap: 10 (ref 5–15)
BUN: 5 mg/dL — ABNORMAL LOW (ref 8–23)
CO2: 26 mmol/L (ref 22–32)
Calcium: 9.7 mg/dL (ref 8.9–10.3)
Chloride: 94 mmol/L — ABNORMAL LOW (ref 98–111)
Creatinine, Ser: 0.54 mg/dL (ref 0.44–1.00)
GFR calc Af Amer: >60 mL/min (ref >60)
GFR calc non Af Amer: >60 mL/min (ref >60)
Glucose, Bld: 111 mg/dL — ABNORMAL HIGH (ref 70–99)
Potassium: 4.5 mmol/L (ref 3.5–5.1)
Sodium: 130 mmol/L — ABNORMAL LOW (ref 135–145)
Total Bilirubin: 1 mg/dL (ref 0.3–1.2)
Total Protein: 6.9 g/dL (ref 6.5–8.1)

## 2018-10-05 LAB — TROPONIN I (HIGH SENSITIVITY)
Troponin I (High Sensitivity): 9 ng/L (ref <18)
Troponin I (High Sensitivity): 8 ng/L (ref <18)

## 2018-10-05 LAB — CBC WITH DIFFERENTIAL/PLATELET
Abs Immature Granulocytes: 0.02 10*3/uL (ref 0.00–0.07)
Basophils Absolute: 0 10*3/uL (ref 0.0–0.1)
Basophils Relative: 0 %
Eosinophils Absolute: 0.1 10*3/uL (ref 0.0–0.5)
Eosinophils Relative: 2 %
HCT: 36.2 % (ref 36.0–46.0)
Hemoglobin: 12.5 g/dL (ref 12.0–15.0)
Immature Granulocytes: 0 %
Lymphocytes Relative: 31 %
Lymphs Abs: 2.2 10*3/uL (ref 0.7–4.0)
MCH: 30.6 pg (ref 26.0–34.0)
MCHC: 34.5 g/dL (ref 30.0–36.0)
MCV: 88.7 fL (ref 80.0–100.0)
Monocytes Absolute: 0.7 10*3/uL (ref 0.1–1.0)
Monocytes Relative: 10 %
Neutro Abs: 4 10*3/uL (ref 1.7–7.7)
Neutrophils Relative %: 57 %
Platelets: 152 10*3/uL (ref 150–400)
RBC: 4.08 MIL/uL (ref 3.87–5.11)
RDW: 12.6 % (ref 11.5–15.5)
WBC: 6.9 10*3/uL (ref 4.0–10.5)
nRBC: 0 % (ref 0.0–0.2)

## 2018-10-05 MED ORDER — METOCLOPRAMIDE HCL 5 MG/ML IJ SOLN
10.0000 mg | Freq: Once | INTRAMUSCULAR | Status: AC
  Administered 2018-10-05: 10 mg via INTRAVENOUS
  Filled 2018-10-05: qty 2

## 2018-10-05 MED ORDER — SODIUM CHLORIDE 0.9 % IV BOLUS
1000.0000 mL | Freq: Once | INTRAVENOUS | Status: AC
  Administered 2018-10-05: 1000 mL via INTRAVENOUS

## 2018-10-05 NOTE — ED Triage Notes (Signed)
[facility] reports that the patient has been feeling generalized weakness, nausea and malaise. Last vitals 156/62, hr-55, temp-98,  O2 100% RA and CBG-154. Reports patient denies chest pain, shortness of breath, and vomiting.  Patient currently endorses dizziness and nausea.

## 2018-10-05 NOTE — ED Provider Notes (Signed)
[facility] EMERGENCY DEPARTMENT Provider Note   CSN: [id number] Arrival date & time: 10/05/18  1135     History   Chief Complaint Chief Complaint  Patient presents with  . Weakness    Generalized    HPI Sheila Gardner is a 83 y.o. female.     Patient is a 83 year old elderly female presenting today with 1 week of persistent symptoms where she describes she just feels bad.  Patient has had a dull consistent headache in the top of her head that she states is a 4 out of 10 but has not gone away.  Also she has just general sense of unwellness.  She states that she gets up and tries to walk she starts feeling very dizzy and lightheaded and feels like she might pass out.  She states she has to sit down after just walking into her kitchen which normally she can get up quickly and go to the kitchen without difficulty.  Also when she does this she starts feeling very nauseated.  She states the nausea starts first and she will occasionally get a dull pressure in her chest but is not pain.  She does not have any of those things at this time.  She did speak with her doctor today who recommended she come here.  She had noticed on Monday her heart rate was low and she asked the pharmacist why and they told her it may be from dehydration so she has been trying to drink lots of fluids.  She has never had anything quite like this but has also noticed her blood pressure has been up this week.  She denies any visual changes, fever, vomiting or diarrhea.  The history is provided by the patient.  Weakness Severity:  Moderate Onset quality:  Gradual Duration:  1 week Timing:  Constant Progression:  Waxing and waning Chronicity:  New Context: not increased activity, not recent infection, not stress and not urinary tract infection   Context comment:  Monday stopped all vitamins and otc meds she was taking but already feeling badly Relieved by: better with lying down. Worsened by:   Standing and activity Ineffective treatments:  Drinking fluids Associated symptoms: difficulty walking, dizziness, headaches and nausea   Associated symptoms: no abdominal pain, no cough, no diarrhea, no dysuria, no numbness in extremities, no fever, no foul-smelling urine, no frequency, no loss of consciousness and no shortness of breath   Risk factors: no new medications   Risk factors comment:  Hx of GERD, HLD and HTN   Past Medical History:  Diagnosis Date  . Arthritis   . Breast cancer ([place])   . Cancer ([place])    Breast- rt  . Cough with sputum 2016  . GERD (gastroesophageal reflux disease)    COSTOCHONDRITIS  . Heart murmur   . Hyperlipidemia   . Hypertension   . Lipoma    back of neck  . PONV (postoperative nausea and vomiting)    only after rt hip surgery    Patient Active Problem List   Diagnosis Date Noted  . Primary osteoarthritis of left knee 11/04/2015  . Primary osteoarthritis of right hip 07/30/2014  . Hip pain 05/08/2014  . Elevated liver function tests 05/09/2013  . Vitamin D deficiency 03/25/2011  . Malignant neoplasm of upper-outer quadrant of right breast in female, estrogen receptor positive ([place]) 12/30/2010    Past Surgical History:  Procedure Laterality Date  . ANKLE FRACTURE SURGERY Right 2013   RIGHT   .  BREAST LUMPECTOMY Right 2011  . BREAST SURGERY  04/22/2009   Rt lumpectomy  . EYE SURGERY  2001   macular hole  . JOINT REPLACEMENT  2016  . MIDDLE EAR SURGERY Left 08/18/2015   repair eardrum and reconstruction  . OVARY SURGERY  40 years ago   wedge  . TOTAL HIP ARTHROPLASTY Right 07/30/2014   Procedure: TOTAL HIP ARTHROPLASTY ANTERIOR APPROACH;  Surgeon: Melrose Nakayama, MD;  Location: [facility];  Service: Orthopedics;  Laterality: Right;  . TOTAL KNEE ARTHROPLASTY Left 11/04/2015  . TOTAL KNEE ARTHROPLASTY Left 11/04/2015   Procedure: TOTAL KNEE ARTHROPLASTY;  Surgeon: Melrose Nakayama, MD;  Location: [facility];  Service: Orthopedics;  Laterality:  Left;  . TYMPANOPLASTY  30 years ago     OB History   No obstetric history on file.      Home Medications    Prior to Admission medications   Medication Sig Start Date End Date Taking? Authorizing Provider  aspirin EC 325 MG EC tablet Take 1 tablet (325 mg total) by mouth 2 (two) times daily after a meal. Patient not taking: Reported on 03/12/2018 11/06/15   Loni Dolly, PA-C  cephALEXin (KEFLEX) 500 MG capsule Take 1 capsule (500 mg total) by mouth 2 (two) times daily. 03/12/18   Tasia Catchings, Amy V, PA-C  Cholecalciferol (VITAMIN D-3) 1000 UNITS CAPS Take 1 capsule by mouth daily.    [provider]  cyanocobalamin 500 MCG tablet Take 500 mcg by mouth daily.    [provider]  hydrOXYzine (ATARAX/VISTARIL) 25 MG tablet Take 1 tablet (25 mg total) by mouth every 6 (six) hours. Patient not taking: Reported on 03/12/2018 11/04/16   Lysbeth Penner, FNP  KLOR-CON 10 10 MEQ tablet Take 10 mEq by mouth 2 (two) times daily. 08/17/15   [provider]  LORazepam (ATIVAN) 1 MG tablet Take 1 mg by mouth every 8 (eight) hours.    [provider]  losartan (COZAAR) 100 MG tablet Take 100 mg by mouth daily.     [provider]  omeprazole (PRILOSEC) 20 MG capsule Take 20 mg by mouth daily.  12/25/10   [provider]  oxybutynin (DITROPAN) 5 MG tablet Take 5 mg by mouth 3 (three) times daily.    [provider]  polyethylene glycol (MIRALAX / GLYCOLAX) packet Take 17 g by mouth daily.    [provider]  predniSONE (STERAPRED UNI-PAK 21 TAB) 10 MG (21) TBPK tablet Take by mouth daily. Take as directed. Patient not taking: Reported on 03/12/2018 04/28/17   Vanessa Kick, MD  pyrithione zinc (HEAD AND SHOULDERS) 1 % shampoo Apply topically daily as needed for itching. 11/04/16   Lysbeth Penner, FNP  traMADol (ULTRAM) 50 MG tablet Take 1 tablet (50 mg total) by mouth every 6 (six) hours as needed. pain Patient not taking: Reported on 03/12/2018  05/08/14   Boelter, Genelle Gather, NP  vitamin C (ASCORBIC ACID) 500 MG tablet Take 500 mg by mouth daily.    [provider]    Family History Family History  Problem Relation Age of Onset  . Cancer Brother        colon, pancreatic, brain    Social History Social History   Tobacco Use  . Smoking status: Never Smoker  . Smokeless tobacco: Never Used  Substance Use Topics  . Alcohol use: Yes    Comment: OCC -none now  . Drug use: No     Allergies   No known allergies  Review of Systems Review of Systems  Constitutional: Negative for fever.  Respiratory: Negative for cough and shortness of breath.   Gastrointestinal: Positive for nausea. Negative for abdominal pain and diarrhea.  Genitourinary: Negative for dysuria and frequency.  Neurological: Positive for dizziness, weakness and headaches. Negative for loss of consciousness.  All other systems reviewed and are negative.    Physical Exam Updated Vital Signs BP (!) 190/66 (BP Location: Left Arm)   Pulse (!) 46   Temp 98.5 F (36.9 C) (Oral)   Resp 12   Ht 5' 4.5" (1.638 m)   Wt 82.1 kg   SpO2 97%   BMI 30.59 kg/m   Physical Exam Vitals signs and nursing note reviewed.  Constitutional:      General: She is in acute distress.     Appearance: She is well-developed.  HENT:     Head: Normocephalic and atraumatic.     Nose: Nose normal.     Mouth/Throat:     Mouth: Mucous membranes are moist.  Eyes:     General: No visual field deficit.    Pupils: Pupils are equal, round, and reactive to light.     Comments: No nystagmus  Cardiovascular:     Rate and Rhythm: Regular rhythm. Bradycardia present.     Pulses: Normal pulses.     Heart sounds: Normal heart sounds. No murmur. No friction rub.  Pulmonary:     Effort: Pulmonary effort is normal.     Breath sounds: Normal breath sounds. No wheezing or rales.  Abdominal:     General: Bowel sounds are normal. There is no distension.     Palpations: Abdomen  is soft.     Tenderness: There is no abdominal tenderness. There is no guarding or rebound.  Musculoskeletal: Normal range of motion.        General: No tenderness.     Right lower leg: No edema.     Left lower leg: No edema.     Comments: No edema  Skin:    General: Skin is warm and dry.     Findings: No rash.  Neurological:     Mental Status: She is alert and oriented to person, place, and time.     Cranial Nerves: No cranial nerve deficit, dysarthria or facial asymmetry.     Sensory: Sensation is intact. No sensory deficit.     Motor: Motor function is intact. No weakness.     Coordination: Coordination is intact. Romberg sign negative. Coordination normal. Finger-Nose-Finger Test and Heel to [facility] Test normal.  Psychiatric:        Mood and Affect: Mood normal.        Behavior: Behavior normal.        Thought Content: Thought content normal.      ED Treatments / Results  Labs (all labs ordered are listed, but only abnormal results are displayed) Labs Reviewed  URINE CULTURE  CBC WITH DIFFERENTIAL/PLATELET  COMPREHENSIVE METABOLIC PANEL  URINALYSIS, ROUTINE W REFLEX MICROSCOPIC  TROPONIN I (HIGH SENSITIVITY)    EKG EKG Interpretation  Date/Time:  Thursday October 05 2018 11:38:36 EDT Ventricular Rate:  44 PR Interval:    QRS Duration: 149 QT Interval:  489 QTC Calculation: 419 R Axis:   97 Text Interpretation:  Sinus bradycardia new Right bundle branch block Confirmed by Blanchie Dessert [phone]) on 10/05/2018 11:54:15 AM   Radiology No results found.  Procedures Procedures (including critical care time)  Medications Ordered in ED Medications  sodium chloride 0.9 %  bolus 1,000 mL (has no administration in time range)  metoCLOPramide (REGLAN) injection 10 mg (has no administration in time range)     Initial Impression / Assessment and Plan / ED Course  I have reviewed the triage vital signs and the nursing notes.  Pertinent labs & imaging results that were  available during my care of the patient were reviewed by me and considered in my medical decision making (see chart for details).        Elderly female presenting today with vague symptoms of feeling unwell, persistent headache and dizziness when she gets up and tries to walk.  Patient is noted to be bradycardic here with a new right bundle branch block from her prior EKG.  The chest pressure she notes usually comes after the nausea starts when she gets up and tries to walk.  Heart rates here remain in the 40s were at previous office visits her heart rate is normally 60s.  Patient takes no rate controlling medication.  She is on losartan for her blood pressure and states typically her blood pressure is 1 34-7 50 systolic.  Here it is been 1 70-1 90.  She has no focal neurologic deficits on exam but she was not walked due to return of her symptoms.  Coordination seems to be intact.  Patient denies infectious symptoms such as fever, cough, vomiting or dysuria.  Symptoms are not suggestive of PE and she has no evidence of CHF.  Concern for possible cardiac cause versus stroke versus electrolyte abnormality.  Labs and imaging pending.  Patient given a headache cocktail with Reglan and IV fluid.  Final Clinical Impressions(s) / ED Diagnoses   Final diagnoses:  None    ED Discharge Orders    None       Blanchie Dessert, MD 10/07/18 [phone]

## 2018-10-05 NOTE — ED Provider Notes (Addendum)
  Physical Exam  BP (!) 158/52   Pulse (!) 45   Temp 98.5 F (36.9 C) (Oral)   Resp 12   Ht 5' 4.5" (1.638 m)   Wt 82.1 kg   SpO2 98%   BMI 30.59 kg/m   Physical Exam  ED Course/Procedures     Procedures  MDM  Assuming care of patient from Dr. Maryan Rued.   Patient in the ED for weakness/feeling unwell with nagging headache that is at the vertex, worse with movement and nausea. Workup thus far shows normal labs. Neuro exam is non focal.  Concerning findings are as following: none. HR is noted to be in the 40s - lower than usual. Hs-tn is normal. New RBBB. Important pending results are: imaging.   According to Dr. Maryan Rued, plan is to f/u on CT scan and reassess the patient. If the symptoms are persistent, then she will need MRI brain to r/o posterior stroke.  Patient had no complains, no concerns from the nursing side. Will continue to monitor.    Varney Biles, MD 10/05/18 1519   11:31 PM  Patient ambulated without any problems. Her nausea, headache, dizziness is resolved. Heart rate was noted in the 40s frequently.  That could be contributing.  We have advised her to follow-up with cardiology.  At the time of discharge her heart rate is in the 60s. I have emailed cardiology service to help her with a prompt follow-up as well.  Strict ER return precautions have been discussed, and patient is agreeing with the plan and is comfortable with the workup done and the recommendations from the ER.    Varney Biles, MD 10/05/18 [phone]

## 2018-10-05 NOTE — ED Notes (Signed)
Pt ambulated self efficiently to restroom with no difficulty. Pt returned safely to bedside. 

## 2018-10-05 NOTE — Discharge Instructions (Addendum)
Please return to the ER if you start having constant dizziness, nausea, fainting spell. We noted that your heart rate has been low. We would like you to follow-up with cardiology for further evaluation for this low heart rate, which could be contributing to your symptoms.  Make sure you are well hydrated.  Make sure you are careful when you stand up or when you are walking in order to prevent any falls.

## 2018-10-05 NOTE — ED Notes (Signed)
IV team at bedside 

## 2018-10-06 LAB — URINE CULTURE

## 2018-10-10 DIAGNOSIS — H9313 Tinnitus, bilateral: Secondary | ICD-10-CM | POA: Diagnosis not present

## 2018-10-10 DIAGNOSIS — H903 Sensorineural hearing loss, bilateral: Secondary | ICD-10-CM | POA: Diagnosis not present

## 2018-10-10 DIAGNOSIS — R51 Headache: Secondary | ICD-10-CM | POA: Diagnosis not present

## 2018-10-10 DIAGNOSIS — H838X3 Other specified diseases of inner ear, bilateral: Secondary | ICD-10-CM | POA: Diagnosis not present

## 2018-10-17 DIAGNOSIS — F411 Generalized anxiety disorder: Secondary | ICD-10-CM | POA: Diagnosis not present

## 2018-10-17 DIAGNOSIS — R001 Bradycardia, unspecified: Secondary | ICD-10-CM | POA: Diagnosis not present

## 2018-10-17 DIAGNOSIS — I10 Essential (primary) hypertension: Secondary | ICD-10-CM | POA: Diagnosis not present

## 2018-10-17 DIAGNOSIS — E876 Hypokalemia: Secondary | ICD-10-CM | POA: Diagnosis not present

## 2018-10-17 DIAGNOSIS — K219 Gastro-esophageal reflux disease without esophagitis: Secondary | ICD-10-CM | POA: Diagnosis not present

## 2018-10-17 DIAGNOSIS — R899 Unspecified abnormal finding in specimens from other organs, systems and tissues: Secondary | ICD-10-CM | POA: Diagnosis not present

## 2018-11-02 DIAGNOSIS — F411 Generalized anxiety disorder: Secondary | ICD-10-CM | POA: Diagnosis not present

## 2018-11-02 DIAGNOSIS — I10 Essential (primary) hypertension: Secondary | ICD-10-CM | POA: Diagnosis not present

## 2018-11-02 DIAGNOSIS — R899 Unspecified abnormal finding in specimens from other organs, systems and tissues: Secondary | ICD-10-CM | POA: Diagnosis not present

## 2018-11-02 DIAGNOSIS — K219 Gastro-esophageal reflux disease without esophagitis: Secondary | ICD-10-CM | POA: Diagnosis not present

## 2018-11-02 DIAGNOSIS — R001 Bradycardia, unspecified: Secondary | ICD-10-CM | POA: Diagnosis not present

## 2018-11-02 DIAGNOSIS — E876 Hypokalemia: Secondary | ICD-10-CM | POA: Diagnosis not present

## 2018-11-02 DIAGNOSIS — R7309 Other abnormal glucose: Secondary | ICD-10-CM | POA: Diagnosis not present

## 2018-11-07 DIAGNOSIS — Z23 Encounter for immunization: Secondary | ICD-10-CM | POA: Diagnosis not present

## 2018-11-07 DIAGNOSIS — R51 Headache: Secondary | ICD-10-CM | POA: Diagnosis not present

## 2018-11-07 DIAGNOSIS — F411 Generalized anxiety disorder: Secondary | ICD-10-CM | POA: Diagnosis not present

## 2018-11-07 DIAGNOSIS — R350 Frequency of micturition: Secondary | ICD-10-CM | POA: Diagnosis not present

## 2018-11-07 DIAGNOSIS — M940 Chondrocostal junction syndrome [Tietze]: Secondary | ICD-10-CM | POA: Diagnosis not present

## 2018-12-13 DIAGNOSIS — D170 Benign lipomatous neoplasm of skin and subcutaneous tissue of head, face and neck: Secondary | ICD-10-CM | POA: Diagnosis not present

## 2018-12-18 NOTE — Progress Notes (Addendum)
Cardiology Office Note:    Date:  12/19/2018   ID:  Sheila Gardner, DOB 1936/02/07, MRN [id number]  PCP:  Lawerance Cruel, MD  Cardiologist:  No primary care provider on file.   Referring MD: Lawerance Cruel, MD   Chief Complaint  Patient presents with  . Irregular Heart Beat  . Advice Only    Bradycardia    History of Present Illness:    Sheila Gardner is a 83 y.o. female with a hx of recurring episodes of weakness, nausea, and bradycardia.  As right bundle branch block on EKG.  Referred by Dr. Lawerance Cruel, MD.  For the past 6 months the patient has experienced intermittent episodes that can last several hours where she becomes extremely weak, diaphoretic, and has associated nausea.  She has to sit down or lie down.  When the episodes go away she is back to her usual level of activity without limitations.  She does mention that walking up a flight of stairs or walking briskly causes dyspnea and fatigue greater than it did 2 to 3 years ago.  That particular dyspnea and fatigue is different than the episodes that are associated with nausea.  In July she was seen in the emergency room because of persisting/prolonged nausea, weakness, and diaphoresis and was found to have a heart rate less than 50 with right bundle branch block.  She was evaluated in the emergency room but sent home.  These episodes that she complains of may occur 2-3 times per month.  They usually last longer than 20 to 30 minutes, and at longest has lasted several hours.  She has never had syncope.  Past Medical History:  Diagnosis Date  . Arthritis   . Breast cancer ([place])   . Cancer ([place])    Breast- rt  . Cough with sputum 2016  . Elevated liver function tests 05/09/2013  . GERD (gastroesophageal reflux disease)    COSTOCHONDRITIS  . Heart murmur   . Hip pain 05/08/2014  . Hyperlipidemia   . Hypertension   . Lipoma    back of neck  . PONV (postoperative nausea and vomiting)    only after rt hip  surgery  . Primary osteoarthritis of left knee 11/04/2015  . Primary osteoarthritis of right hip 07/30/2014    Past Surgical History:  Procedure Laterality Date  . ANKLE FRACTURE SURGERY Right 2013   RIGHT   . BREAST LUMPECTOMY Right 2011  . BREAST SURGERY  04/22/2009   Rt lumpectomy  . EYE SURGERY  2001   macular hole  . JOINT REPLACEMENT  2016  . MIDDLE EAR SURGERY Left 08/18/2015   repair eardrum and reconstruction  . OVARY SURGERY  40 years ago   wedge  . TOTAL HIP ARTHROPLASTY Right 07/30/2014   Procedure: TOTAL HIP ARTHROPLASTY ANTERIOR APPROACH;  Surgeon: Melrose Nakayama, MD;  Location: [facility];  Service: Orthopedics;  Laterality: Right;  . TOTAL KNEE ARTHROPLASTY Left 11/04/2015  . TOTAL KNEE ARTHROPLASTY Left 11/04/2015   Procedure: TOTAL KNEE ARTHROPLASTY;  Surgeon: Melrose Nakayama, MD;  Location: [facility];  Service: Orthopedics;  Laterality: Left;  . TYMPANOPLASTY  30 years ago    Current Medications: Current Meds  Medication Sig  . acetaminophen (TYLENOL) 500 MG tablet Take 500-1,000 mg by mouth every 6 (six) hours as needed for mild pain or headache.  Marland Kitchen BIOTIN PO Take 1 tablet by mouth daily with breakfast.  . Cholecalciferol (VITAMIN D-3) 1000 UNITS CAPS Take 1,000 Units by  mouth daily with breakfast.   . cyanocobalamin 500 MCG tablet Take 500 mcg by mouth daily.  Marland Kitchen LORazepam (ATIVAN) 1 MG tablet Take 1 mg by mouth See admin instructions. Take 1 mg by mouth at bedtime and an additional 1 mg once a day as needed for anxiety  . losartan (COZAAR) 100 MG tablet Take 100 mg by mouth daily.   Marland Kitchen omeprazole (PRILOSEC) 20 MG capsule Take 20 mg by mouth daily before breakfast.   . potassium chloride (K-DUR) 10 MEQ tablet Take 20 mEq by mouth daily.   Marland Kitchen pyrithione zinc (HEAD AND SHOULDERS) 1 % shampoo Apply topically daily as needed for itching.  . vitamin C (ASCORBIC ACID) 500 MG tablet Take 500 mg by mouth daily.     Allergies:   Patient has no known allergies.   Social History    Socioeconomic History  . Marital status: Widowed    Spouse name: Not on file  . Number of children: Not on file  . Years of education: Not on file  . Highest education level: Not on file  Occupational History  . Not on file  Social Needs  . Financial resource strain: Not on file  . Food insecurity    Worry: Not on file    Inability: Not on file  . Transportation needs    Medical: Not on file    Non-medical: Not on file  Tobacco Use  . Smoking status: Never Smoker  . Smokeless tobacco: Never Used  Substance and Sexual Activity  . Alcohol use: Yes    Comment: OCC -none now  . Drug use: No  . Sexual activity: Not on file  Lifestyle  . Physical activity    Days per week: Not on file    Minutes per session: Not on file  . Stress: Not on file  Relationships  . Social Herbalist on phone: Not on file    Gets together: Not on file    Attends religious service: Not on file    Active member of club or organization: Not on file    Attends meetings of clubs or organizations: Not on file    Relationship status: Not on file  Other Topics Concern  . Not on file  Social History Narrative  . Not on file     Family History: The patient's family history includes Cancer in her brother; Heart disease in her mother.  ROS:   Please see the history of present illness.    Her other medical problems include a history of breast cancer, hiatal hernia, anxiety, prior right hip replacement, adult onset diabetes mellitus type 2, and hyperlipidemia.  All other systems reviewed and are negative.  EKGs/Labs/Other Studies Reviewed:    The following studies were reviewed today:  Chest CT scan performed in December 2017: IMPRESSION: 1. Stable right middle lobe and right lower lobe of lung nodules when compared with 10/07/2014. These are likely benign and no further followup is indicated at this time. This recommendation follows the consensus statement: Guidelines for Management of  Incidental Pulmonary Nodules Detected on CT Images: From the Fleischner Society 2017; Radiology 2017; 284:228-243. 2. 4 mm nodule within the right upper lobe is unchanged since 04/07/2015 (not imaged on 10/07/2014). No follow-up needed if patient is low-risk. Non-contrast chest CT can be considered in 12 months (from 04/07/2015) if patient is high-risk. This recommendation follows the consensus statement: Guidelines for Management of Incidental Pulmonary Nodules Detected on CT Images: From the Fleischner  Society 2017; Radiology 2017; 284:228-243. 3. Aortic atherosclerosis and coronary artery calcification.  EKG:  EKG was performed on 10/06/2018 and demonstrated right bundle branch block, bradycardia with heart rate less than 40, and possible atrial bigeminy with blocked premature atrial beat potentially contributing to the bradycardia. Today's ECG demonstrates right bundle branch block, atrial fibrillation, and controlled ventricular response at 77 bpm.  Atrial fibrillation is new.  Recent Labs: 10/05/2018: ALT 17; BUN 5; Creatinine, Ser 0.54; Hemoglobin 12.5; Platelets 152; Potassium 4.5; Sodium 130  Recent Lipid Panel No results found for: CHOL, TRIG, HDL, CHOLHDL, VLDL, LDLCALC, LDLDIRECT  Physical Exam:    VS:  BP 136/84   Pulse 83   Ht 5' 4.5" (1.638 m)   Wt 190 lb 6.4 oz (86.4 kg)   BMI 32.18 kg/m     Wt Readings from Last 3 Encounters:  12/19/18 190 lb 6.4 oz (86.4 kg)  10/05/18 181 lb (82.1 kg)  04/28/17 188 lb (85.3 kg)     GEN: Moderate obesity.  She is wearing a mask.  She appears younger than stated age.. No acute distress HEENT: Normal NECK: No JVD. LYMPHATICS: No lymphadenopathy CARDIAC: Irregular RR with soft 1/6 systolic murmur, but no gallop, or edema. VASCULAR:  Normal Pulses. No bruits. RESPIRATORY:  Clear to auscultation without rales, wheezing or rhonchi  ABDOMEN: Soft, non-tender, non-distended, No pulsatile mass, MUSCULOSKELETAL: No deformity  SKIN:  Warm and dry NEUROLOGIC:  Alert and oriented x 3 PSYCHIATRIC:  Normal affect   ASSESSMENT:    1. Atrial fibrillation with controlled ventricular rate ([place])   2. Right bundle branch block   3. Slow heart rate   4. Fatigue, unspecified type   5. Essential hypertension   6. Type 2 diabetes mellitus with complication, without long-term current use of insulin ([facility])   7. Pure hypercholesterolemia   8. Coronary artery calcification   9. Educated about COVID-19 virus infection   47. Paroxysmal atrial fibrillation ([facility])    PLAN:    In order of problems listed above:  1. Atrial fibrillation is a new diagnosis.  The presence of control ventricular response is consistent with having underlying AV conduction disease as noted by the chronic prior presence of right bundle branch block.  Atrial for ablation was discussed.  Stroke risk was discussed.  [facility] is 4.  Eliquis is indicated.  May not need cardioversion since she is asymptomatic today and no evidence of heart failure is present.  We will continue to monitor the patient for 30 days as bradycardia/high-grade AV block is suspected. 2D  2. As noted above. 3. Related to AV conduction system disease most likely. 4. Likely related to rhythm disturbance and or chronotropic incompetence. 5. Adequate blood pressure control.  At her age target is 140/90 mmHg. 6. Hemoglobin A1c less than 7 is advocated.  7. LDL target is less than 70. 8. Three-vessel coronary calcification noted on noncardiac CT 2017.  This was briefly discussed with the patient.  It may have relevance related to the patient's exertional dyspnea.  Functional assessment may be necessary. 9. 3 W's discussed and endorsed by the patient as lifestyle measures.  The natural history of atrial fibrillation was discussed.  The inability to cure and the possibility of recurrences was clearly stated.  Management strategies including rhythm control (antiarrhythmic therapy), rate control  (beta-blocker therapy or AV node blocking calcium channel blocker therapy), and ablation and/or pacemaker therapy were reviewed.  Stroke risk (as determined by CHADS VASC score >1) was discussed  relative to the patient's individual profile.  The expected duration/permanence of anti-coagulation therapy was determined based on the individual risk score.  Coumadin versus NOAC therapy was reviewed, highlighting the lower bleeding risk and improved safety with NOAC therapy.      Medication Adjustments/Labs and Tests Ordered: Current medicines are reviewed at length with the patient today.  Concerns regarding medicines are outlined above.  Orders Placed This Encounter  Procedures  . Cardiac event monitor  . EKG 12-Lead  . ECHOCARDIOGRAM COMPLETE   Meds ordered this encounter  Medications  . apixaban (ELIQUIS) 5 MG TABS tablet    Sig: Take 1 tablet (5 mg total) by mouth 2 (two) times daily.    Dispense:  60 tablet    Refill:  11    Patient Instructions  Medication Instructions:  1) START Eliquis 5mg  twice daily  If you need a refill on your cardiac medications before your next appointment, please call your pharmacy.   Lab work: None If you have labs (blood work) drawn today and your tests are completely normal, you will receive your results only by: Marland Kitchen MyChart Message (if you have MyChart) OR . A paper copy in the mail If you have any lab test that is abnormal or we need to change your treatment, we will call you to review the results.  Testing/Procedures:  Your physician has requested that you have an echocardiogram. Echocardiography is a painless test that uses sound waves to create images of your heart. It provides your doctor with information about the size and shape of your heart and how well your heart's chambers and valves are working. This procedure takes approximately one hour. There are no restrictions for this procedure.   Your physician has recommended that you wear an event  monitor. Event monitors are medical devices that record the heart's electrical activity. Doctors most often [place] these monitors to diagnose arrhythmias. Arrhythmias are problems with the speed or rhythm of the heartbeat. The monitor is a small, portable device. You can wear one while you do your normal daily activities. This is usually used to diagnose what is causing palpitations/syncope (passing out).   Follow-Up: Your physician recommends that you schedule a follow-up appointment after monitor is complete.  (Can have 12/4 at 11:40A or 1:20P or 12/9 at 10A or 11:40A)    Any Other Special Instructions Will Be Listed Below (If Applicable).       Signed, Sinclair Grooms, MD  12/19/2018 12:03 PM    [place]

## 2018-12-19 ENCOUNTER — Encounter

## 2018-12-19 ENCOUNTER — Encounter: Payer: Self-pay | Admitting: Interventional Cardiology

## 2018-12-19 ENCOUNTER — Telehealth: Payer: Self-pay

## 2018-12-19 ENCOUNTER — Ambulatory Visit (INDEPENDENT_AMBULATORY_CARE_PROVIDER_SITE_OTHER): Payer: Medicare Other | Admitting: Interventional Cardiology

## 2018-12-19 ENCOUNTER — Other Ambulatory Visit: Payer: Self-pay

## 2018-12-19 VITALS — BP 136/84 | HR 83 | Ht 64.5 in | Wt 190.4 lb

## 2018-12-19 DIAGNOSIS — Z7189 Other specified counseling: Secondary | ICD-10-CM | POA: Diagnosis not present

## 2018-12-19 DIAGNOSIS — I2510 Atherosclerotic heart disease of native coronary artery without angina pectoris: Secondary | ICD-10-CM | POA: Diagnosis not present

## 2018-12-19 DIAGNOSIS — R5383 Other fatigue: Secondary | ICD-10-CM

## 2018-12-19 DIAGNOSIS — I480 Paroxysmal atrial fibrillation: Secondary | ICD-10-CM | POA: Diagnosis not present

## 2018-12-19 DIAGNOSIS — I4891 Unspecified atrial fibrillation: Secondary | ICD-10-CM | POA: Diagnosis not present

## 2018-12-19 DIAGNOSIS — I4510 Unspecified right bundle-branch block: Secondary | ICD-10-CM | POA: Diagnosis not present

## 2018-12-19 DIAGNOSIS — R001 Bradycardia, unspecified: Secondary | ICD-10-CM

## 2018-12-19 DIAGNOSIS — E118 Type 2 diabetes mellitus with unspecified complications: Secondary | ICD-10-CM | POA: Diagnosis not present

## 2018-12-19 DIAGNOSIS — I10 Essential (primary) hypertension: Secondary | ICD-10-CM

## 2018-12-19 DIAGNOSIS — I2584 Coronary atherosclerosis due to calcified coronary lesion: Secondary | ICD-10-CM

## 2018-12-19 DIAGNOSIS — E7800 Pure hypercholesterolemia, unspecified: Secondary | ICD-10-CM | POA: Diagnosis not present

## 2018-12-19 MED ORDER — APIXABAN 5 MG PO TABS: 5.0000 mg | ORAL_TABLET | Freq: Two times a day (BID) | ORAL | 11 refills | Status: DC

## 2018-12-19 NOTE — Telephone Encounter (Signed)
Went over monitor instructions with pt during her visit today with Dr [name]. Verified address. 30 day [employer] event monitor ordered to be delivered to pt's home.

## 2018-12-19 NOTE — Patient Instructions (Addendum)
Medication Instructions:  1) START Eliquis 5mg  twice daily  If you need a refill on your cardiac medications before your next appointment, please call your pharmacy.   Lab work: None If you have labs (blood work) drawn today and your tests are completely normal, you will receive your results only by: Marland Kitchen MyChart Message (if you have MyChart) OR . A paper copy in the mail If you have any lab test that is abnormal or we need to change your treatment, we will call you to review the results.  Testing/Procedures:  Your physician has requested that you have an echocardiogram. Echocardiography is a painless test that uses sound waves to create images of your heart. It provides your doctor with information about the size and shape of your heart and how well your heart's chambers and valves are working. This procedure takes approximately one hour. There are no restrictions for this procedure.   Your physician has recommended that you wear an event monitor. Event monitors are medical devices that record the heart's electrical activity. Doctors most often [place] these monitors to diagnose arrhythmias. Arrhythmias are problems with the speed or rhythm of the heartbeat. The monitor is a small, portable device. You can wear one while you do your normal daily activities. This is usually used to diagnose what is causing palpitations/syncope (passing out).   Follow-Up: Your physician recommends that you schedule a follow-up appointment after monitor is complete.  (Can have 12/4 at 11:40A or 1:20P or 12/9 at 10A or 11:40A)    Any Other Special Instructions Will Be Listed Below (If Applicable).

## 2018-12-26 ENCOUNTER — Telehealth: Payer: Self-pay

## 2018-12-26 NOTE — Telephone Encounter (Signed)
I called Mitch ([employer] Rep) and he checked the status of the monitor. It's on the truck to be delivered today. I called the pt and informed her of this.

## 2018-12-26 NOTE — Telephone Encounter (Signed)
-----   Message from Lenard Galloway V sent at 12/26/2018  9:44 AM EDT ----- Regarding: Monitor Patient has not received monitor. It was sent out on December 19, 2018.

## 2018-12-27 ENCOUNTER — Ambulatory Visit (INDEPENDENT_AMBULATORY_CARE_PROVIDER_SITE_OTHER): Payer: Medicare Other

## 2018-12-27 ENCOUNTER — Encounter: Payer: Self-pay | Admitting: Interventional Cardiology

## 2018-12-27 ENCOUNTER — Other Ambulatory Visit: Payer: Self-pay

## 2018-12-27 ENCOUNTER — Ambulatory Visit (HOSPITAL_COMMUNITY): Payer: Medicare Other | Attending: Cardiology

## 2018-12-27 DIAGNOSIS — I480 Paroxysmal atrial fibrillation: Secondary | ICD-10-CM | POA: Insufficient documentation

## 2018-12-28 ENCOUNTER — Telehealth: Payer: Self-pay | Admitting: Interventional Cardiology

## 2018-12-28 ENCOUNTER — Telehealth: Payer: Self-pay | Admitting: *Deleted

## 2018-12-28 NOTE — Telephone Encounter (Signed)
Spoke with pt and went over echo results.  While on the phone, pt mentioned how expensive her Eliquis is.  Advised I will send message to the PA nurse and see if we are able to assist her with cost.

## 2018-12-28 NOTE — Telephone Encounter (Signed)
I called [facility] and was advised that the cost for the pts Eliquis is $391.78/30 day supply and that it looks like the pt has a deductible that has not been met for this year. The pt just started taking Eliquis on 12/19/2018.  I called the pt and she thinks that she does have a deductible.  She wants to know if there is a less expensive medication she can take instead.  I briefly explained Coumadin to her.  She states that she is going to call her plan to get a better understanding of her coverage and will call us back if she decides to switch to Coumadin.

## 2018-12-28 NOTE — Telephone Encounter (Signed)
Showed the pts baseline recording to DOD Dr. Burt Knack, and he noted that the pt is in afib, and she should continue her med regimen and taking her Eliquis 5 mg po bid, continue to monitor, and follow-up as planned with Dr. [name]. Pt verbalized understanding and agrees with this plan.  Will route this message to Dr. [name] and covering RN as a general FYI.  Will have the pts monitor scanned into the system today.

## 2018-12-28 NOTE — Telephone Encounter (Signed)
[employer] faxed over a serious event monitor recording on this pt. This was a baseline reading. Event showed on 10/21 at 2:26 pm, that the pt was noted to be in sustained atrial fibrillation with a HR-90 bpm.  Appears the pt was recently started on Eliquis for this, as indicated below from Dr. Darliss Ridgel assessment and plan on the pt from [facility] 12/19/18. Talked with the pt and asked if she was having any symptoms during this recorded event, and she states all she felt yesterday was slight chest pressure.  She voiced no other cardiac complaints. Pt states she is asymptomatic today.  Did confirm with the pt that she is taking her Eliquis 5 mg po bid, as prescribed, and she replied "yes."   Will go and show DOD Dr. Burt Knack the pts event monitor recording, and to further advise.     PLAN:    In order of problems listed above:  1. Atrial fibrillation is a new diagnosis.  The presence of control ventricular response is consistent with having underlying AV conduction disease as noted by the chronic prior presence of right bundle branch block.  Atrial for ablation was discussed.  Stroke risk was discussed.  [facility] is 4.  Eliquis is indicated.  May not need cardioversion since she is asymptomatic today and no evidence of heart failure is present.  We will continue to monitor the patient for 30 days as bradycardia/high-grade AV block is suspected. 2D  2. As noted above. 3. Related to AV conduction system disease most likely. 4. Likely related to rhythm disturbance and or chronotropic incompetence. 5. Adequate blood pressure control.  At her age target is 140/90 mmHg. 6. Hemoglobin A1c less than 7 is advocated.  7. LDL target is less than 70. 8. Three-vessel coronary calcification noted on noncardiac CT 2017.  This was briefly discussed with the patient.  It may have relevance related to the patient's exertional dyspnea.  Functional assessment may be necessary. 9. 3 W's discussed and endorsed by the  patient as lifestyle measures.  The natural history of atrial fibrillation was discussed.  The inability to cure and the possibility of recurrences was clearly stated.  Management strategies including rhythm control (antiarrhythmic therapy), rate control (beta-blocker therapy or AV node blocking calcium channel blocker therapy), and ablation and/or pacemaker therapy were reviewed.  Stroke risk (as determined by CHADS VASC score >1) was discussed relative to the patient's individual profile.  The expected duration/permanence of anti-coagulation therapy was determined based on the individual risk score.  Coumadin versus NOAC therapy was reviewed, highlighting the lower bleeding risk and improved safety with NOAC therapy.

## 2018-12-29 NOTE — Telephone Encounter (Signed)
The monitor is being done to determine if AF continuous or Paroxysmal. Was in AF during OV.

## 2019-01-03 DIAGNOSIS — D6859 Other primary thrombophilia: Secondary | ICD-10-CM | POA: Diagnosis not present

## 2019-01-03 DIAGNOSIS — I4891 Unspecified atrial fibrillation: Secondary | ICD-10-CM | POA: Diagnosis not present

## 2019-01-03 DIAGNOSIS — F411 Generalized anxiety disorder: Secondary | ICD-10-CM | POA: Diagnosis not present

## 2019-01-05 DIAGNOSIS — D170 Benign lipomatous neoplasm of skin and subcutaneous tissue of head, face and neck: Secondary | ICD-10-CM | POA: Diagnosis not present

## 2019-01-05 DIAGNOSIS — Z7901 Long term (current) use of anticoagulants: Secondary | ICD-10-CM | POA: Diagnosis not present

## 2019-01-05 DIAGNOSIS — Z853 Personal history of malignant neoplasm of breast: Secondary | ICD-10-CM | POA: Diagnosis not present

## 2019-01-16 DIAGNOSIS — H9072 Mixed conductive and sensorineural hearing loss, unilateral, left ear, with unrestricted hearing on the contralateral side: Secondary | ICD-10-CM | POA: Diagnosis not present

## 2019-01-16 DIAGNOSIS — H608X3 Other otitis externa, bilateral: Secondary | ICD-10-CM | POA: Diagnosis not present

## 2019-01-16 DIAGNOSIS — H838X3 Other specified diseases of inner ear, bilateral: Secondary | ICD-10-CM | POA: Diagnosis not present

## 2019-01-17 ENCOUNTER — Telehealth: Payer: Self-pay | Admitting: Interventional Cardiology

## 2019-01-17 NOTE — Telephone Encounter (Signed)
Pt was given 1 box of Eliquis, a week supply and given information to call to inquire about patient assistant application.

## 2019-01-17 NOTE — Telephone Encounter (Signed)
Patient calling the office for samples of medication:   1.  What medication and dosage are you requesting samples for?  apixaban (ELIQUIS) 5 MG TABS tablet  2.  Are you currently out of this medication? Pt has 1 pill for tonight, but will be out tomorrow

## 2019-01-17 NOTE — Telephone Encounter (Signed)
1 week of samples left at front desk .Adonis Housekeeper

## 2019-01-18 ENCOUNTER — Telehealth: Payer: Self-pay | Admitting: Interventional Cardiology

## 2019-01-18 NOTE — Telephone Encounter (Signed)
I believe pt is asking for assistance in paying for her Eliquis. I will route to [facility], LPN as well as Dr. Thompson Caul nurse Marveen Reeks RN.

## 2019-01-18 NOTE — Telephone Encounter (Signed)
New Message    Pt is calling because she is trying to get help with the cost of her medications  She said she needs to talk to the nurse     Please call

## 2019-01-19 ENCOUNTER — Telehealth (HOSPITAL_COMMUNITY): Payer: Self-pay | Admitting: Licensed Clinical Social Worker

## 2019-01-19 NOTE — Telephone Encounter (Signed)
**Note De-Identified Sheila Gardner Obfuscation** The pt just started taking Eliquis on 12/19/2018 for A-fib. She has a high deductible and cannot afford.  Her concern is that if she were able to afford the deductible now (which is difficult for her) she will owe the deductible again in January 2021.  The office does not have enough samples of Eliquis to get her through the year as samples are limited this time of year and they are meant for new starts..  She does not want to take Coumadin which is less expensive but require monthly visits for INR checks.  She is advised that I will reach out to our social worker for assistance with her Eliquis in hopes that they can help her.

## 2019-01-19 NOTE — Telephone Encounter (Signed)
**Note De-Identified Sheila Gardner Obfuscation** I have started a Eliquis tier exception through covermymeds. Key: [name]

## 2019-01-19 NOTE — Telephone Encounter (Signed)
CSW consulted for medication cost concerns.  Pt prescribed Eliquis but stating that it is not affordable at this time as she has not reached her deductible for medications for the year the medication would be over $300.  Pt states she spoke to a representative with her Rx insurance who stated part of the issue is that Eliquis is a tier 3 medication and the tier 1-2 are more affordable.  CSW explained tier exception as possible solution to this and patient is agreeable to try.  CSW [employer] [address] to complete tier exception- they will complete and update CSW on the result.  If tier exception is denied or doesn't lower cost we can try [employer] but as it is the end of the year it would only be useful for 1 fill and then we could have to reapply in 2021 and she would be required to meet a copay requirement which would likely not happened until late in the year so we would still have the problem of the deductible.  CSW will continue to follow and assist as needed  Jorge Ny, [facility] Desk#: [phone] Cell#: [phone]

## 2019-01-22 ENCOUNTER — Telehealth (HOSPITAL_COMMUNITY): Payer: Self-pay | Admitting: Licensed Clinical Social Worker

## 2019-01-22 ENCOUNTER — Other Ambulatory Visit: Payer: Self-pay | Admitting: *Deleted

## 2019-01-22 ENCOUNTER — Telehealth: Payer: Self-pay | Admitting: Interventional Cardiology

## 2019-01-22 NOTE — Telephone Encounter (Signed)
New message    Pt c/o medication issue:  1. Name of Medication: eliquis  2. How are you currently taking this medication (dosage and times per day)? As written   3. Are you having a reaction (difficulty breathing--STAT)? Leg and feet, burning sensation  4. What is your medication issue? Patient states since starting medication her legs, knees have pain

## 2019-01-22 NOTE — Telephone Encounter (Signed)
CSW informed by pt cardiology office that tier exception for Eliquis was denied as it is a brand name medication which they don't allow to go under a tier 3.  CSW called pt to inform and discuss options:  1. Change to coumadin 2. Switch insurance 3. Just plan to pay the $350 until she reaches her deductible next year  Pt is going to look at other insurance plans but also states she has been having intense leg burning/pain since she has been on Eliquis.  CSW encouraged her to call her MD office immediately to discuss with an Therapist, sports.  CSW will continue to follow and assist as needed  Jorge Ny, [facility] Desk#: [phone] Cell#: [phone]

## 2019-01-22 NOTE — Telephone Encounter (Signed)
**Note De-Identified Nizar Cutler Obfuscation** Letter received from [place] stating that they have denied this Eliquis tier exception Reason:  Under Medicare Part D rules once a name brand medication has been lowered to a tier 3 it cannot be lowered any further as tier 3 is the lowest allowed for name brand. They recommend Coumadin.  Because the pt has Ins coverage for her Eliquis and this is a deductible issue [facility] pt asst will deny asst.. The pt may need to consider Coumadin until the first of the year to avoid her paying 2 deductibles so close together.  Will forward not to [place], LCSW as FYI.

## 2019-01-22 NOTE — Telephone Encounter (Addendum)
Leg pain and burning is not a common side effect with Eliquis. An allergic reaction could be possible - this would more likely present as itching without the burning or pain.   It looks as though cost has been an issue as well - pt has a high deductible and tier exception was denied, pt assistance denied since her main issue is her deductible.  May be worth changing to warfarin since she would still run into this same cost issue with any other DOAC, unless she plans on changing her Part D coverage next year to a different plan without a deductible or that has a lower deductible.

## 2019-01-22 NOTE — Telephone Encounter (Signed)
Pt states in the last week she has noticed a burning sensation and itching that runs from right above her knee to right below and goes down into her feet when she's wearing shoes.  Denies discoloration, swelling or hives.  Uses Cortisone cream and it relieves it for a little bit.  It has progressively gotten worse over the last week.  Only new medication is Eliquis.  Advised I will route to Dr. [name] and my pharmacy team to review and see if this is a side effect of Eliquis.

## 2019-01-23 NOTE — Telephone Encounter (Signed)
Patient is calling back to get an update on her eliquis medication. Wants a sample for 2 weeks.  Patient calling the office for samples of medication:   1.  What medication and dosage are you requesting samples for? apixaban (ELIQUIS) 5 MG TABS tablet  2.  Are you currently out of this medication? No, has 3 tablets left.

## 2019-01-24 NOTE — Telephone Encounter (Signed)
Determine if she would like to switch to warfarin which would be cheaper.

## 2019-01-25 NOTE — Telephone Encounter (Signed)
Spoke with pt and she does not wish to switch to Warfarin.  She is working diligently to find an assistance program to help with cost.  She asked about getting some more samples to buy her some time to get this worked out.  Advised I will place 2 boxes of samples downstairs and to come by to pick those up.  Pt very appreciative for help.

## 2019-01-25 NOTE — Telephone Encounter (Signed)
Patient called again she would like a call back.  She states she is in the donut hole. Eliquis is too expensive for her. Would like a call back.

## 2019-01-25 NOTE — Telephone Encounter (Signed)
Left message to call back  

## 2019-02-05 DIAGNOSIS — I10 Essential (primary) hypertension: Secondary | ICD-10-CM | POA: Diagnosis not present

## 2019-02-05 DIAGNOSIS — E782 Mixed hyperlipidemia: Secondary | ICD-10-CM | POA: Diagnosis not present

## 2019-02-05 DIAGNOSIS — E1169 Type 2 diabetes mellitus with other specified complication: Secondary | ICD-10-CM | POA: Diagnosis not present

## 2019-02-08 DIAGNOSIS — Z0000 Encounter for general adult medical examination without abnormal findings: Secondary | ICD-10-CM | POA: Diagnosis not present

## 2019-02-08 NOTE — Progress Notes (Addendum)
Cardiology Office Note:    Date:  02/09/2019   ID:  Sheila Gardner, DOB 11-02-1935, MRN [id number]  PCP:  Lawerance Cruel, MD  Cardiologist:  Sinclair Grooms, MD   Referring MD: Lawerance Cruel, MD   Chief Complaint  Patient presents with  . Atrial Fibrillation  . Chest Pain    History of Present Illness:    Sheila Gardner is a 83 y.o. female with a hx of weakness, nausea, bradycardia, right bundle branch block on EKG, chronic anticoagulation, and AF.Marland Kitchen  She returns today in follow-up after having worn an event monitor for 1 month and completed an echocardiogram.  The results of the studies as noted below.  The patient has a history dating back to July 30 when she visited the emergency room, experiencing chest discomfort and "just feeling bad".  EKG performed at that time demonstrated severe sinus bradycardia at rate of 44 bpm and with new right bundle branch block.  She was not admitted to the hospital.  Heart rate improved.  She was discharged.  In the interim she has had intermittent episodes of fatigue similar to the emergency room visit but has not been as severe.  In October she was diagnosed with atrial fibrillation by her primary physician Dr. Melinda Crutch.  She was seen by cardiology on December 19, 2018.  A monitor was placed and apixaban started.  She is back today for follow-up.  Still has exertional fatigue.  She has not had syncope or any significant prolonged weak spells as before.  Additionally today, we discussed snoring which has been a significant problem that led her husband to sleep in a separate bedroom before he passed away 5 years ago.  Frequently lethargic and tired after she awakens in the mornings.  Past Medical History:  Diagnosis Date  . Arthritis   . Breast cancer ([place])   . Cancer ([place])    Breast- rt  . Cough with sputum 2016  . Elevated liver function tests 05/09/2013  . GERD (gastroesophageal reflux disease)    COSTOCHONDRITIS  . Heart murmur   .  Hip pain 05/08/2014  . Hyperlipidemia   . Hypertension   . Lipoma    back of neck  . PONV (postoperative nausea and vomiting)    only after rt hip surgery  . Primary osteoarthritis of left knee 11/04/2015  . Primary osteoarthritis of right hip 07/30/2014    Past Surgical History:  Procedure Laterality Date  . ANKLE FRACTURE SURGERY Right 2013   RIGHT   . BREAST LUMPECTOMY Right 2011  . BREAST SURGERY  04/22/2009   Rt lumpectomy  . EYE SURGERY  2001   macular hole  . JOINT REPLACEMENT  2016  . MIDDLE EAR SURGERY Left 08/18/2015   repair eardrum and reconstruction  . OVARY SURGERY  40 years ago   wedge  . TOTAL HIP ARTHROPLASTY Right 07/30/2014   Procedure: TOTAL HIP ARTHROPLASTY ANTERIOR APPROACH;  Surgeon: Melrose Nakayama, MD;  Location: [facility];  Service: Orthopedics;  Laterality: Right;  . TOTAL KNEE ARTHROPLASTY Left 11/04/2015  . TOTAL KNEE ARTHROPLASTY Left 11/04/2015   Procedure: TOTAL KNEE ARTHROPLASTY;  Surgeon: Melrose Nakayama, MD;  Location: [facility];  Service: Orthopedics;  Laterality: Left;  . TYMPANOPLASTY  30 years ago    Current Medications: Current Meds  Medication Sig  . acetaminophen (TYLENOL) 500 MG tablet Take 500-1,000 mg by mouth every 6 (six) hours as needed for mild pain or headache.  Marland Kitchen apixaban (  ELIQUIS) 5 MG TABS tablet Take 1 tablet (5 mg total) by mouth 2 (two) times daily.  Marland Kitchen BIOTIN PO Take 1 tablet by mouth daily with breakfast.  . Cholecalciferol (VITAMIN D-3) 1000 UNITS CAPS Take 1,000 Units by mouth daily with breakfast.   . cyanocobalamin 500 MCG tablet Take 500 mcg by mouth daily.  Marland Kitchen LORazepam (ATIVAN) 1 MG tablet Take 1 mg by mouth See admin instructions. Take 1 mg by mouth at bedtime and an additional 1 mg once a day as needed for anxiety  . losartan (COZAAR) 100 MG tablet Take 100 mg by mouth daily.   Marland Kitchen omeprazole (PRILOSEC) 20 MG capsule Take 20 mg by mouth daily before breakfast.   . oxybutynin (DITROPAN) 5 MG tablet Take 5 mg by mouth as needed.   . potassium chloride (K-DUR) 10 MEQ tablet Take 20 mEq by mouth daily.   . pravastatin (PRAVACHOL) 20 MG tablet Take 20 mg by mouth daily.  Marland Kitchen pyrithione zinc (HEAD AND SHOULDERS) 1 % shampoo Apply topically daily as needed for itching.  . vitamin C (ASCORBIC ACID) 500 MG tablet Take 500 mg by mouth daily.     Allergies:   Patient has no known allergies.   Social History   Socioeconomic History  . Marital status: Widowed    Spouse name: Not on file  . Number of children: Not on file  . Years of education: Not on file  . Highest education level: Not on file  Occupational History  . Not on file  Social Needs  . Financial resource strain: Not on file  . Food insecurity    Worry: Not on file    Inability: Not on file  . Transportation needs    Medical: Not on file    Non-medical: Not on file  Tobacco Use  . Smoking status: Never Smoker  . Smokeless tobacco: Never Used  Substance and Sexual Activity  . Alcohol use: Yes    Comment: OCC -none now  . Drug use: No  . Sexual activity: Not on file  Lifestyle  . Physical activity    Days per week: Not on file    Minutes per session: Not on file  . Stress: Not on file  Relationships  . Social Herbalist on phone: Not on file    Gets together: Not on file    Attends religious service: Not on file    Active member of club or organization: Not on file    Attends meetings of clubs or organizations: Not on file    Relationship status: Not on file  Other Topics Concern  . Not on file  Social History Narrative  . Not on file     Family History: The patient's family history includes Cancer in her brother; Heart disease in her mother.  ROS:   Please see the history of present illness.    Has had previous work-up for ischemic heart disease.  Has had chest pressure dating back greater than 10 years.  Previous evaluation by Dr. Marlou Porch all other systems reviewed and are negative.  EKGs/Labs/Other Studies Reviewed:     The following studies were reviewed today: Cardiac event monitor 12/27/2018: IMPRESSIONS  Continuous atrial for ablation  Heart rate ranges between 50 and 120 bpm.  Nocturnal pauses greater than 3 seconds but less than 4 seconds. No significant pauses during waking hours.  TACHYCARDIA Bradycardia syndrome    2D Doppler echocardiogram 12/27/2018: IMPRESSIONS    1. Left ventricular  ejection fraction, by visual estimation, is 65 to 70%. The left ventricle has hyperdynamic function. Normal left ventricular size. There is moderately increased left ventricular hypertrophy. Normal wall motion.  2. Left ventricular diastolic Doppler parameters are indeterminate pattern of LV diastolic filling.  3. Global right ventricle has normal systolic function.The right ventricular size is normal. No increase in right ventricular wall thickness.  4. Left atrial size was normal.  5. Right atrial size was normal.  6. Mild mitral annular calcification.  7. The mitral valve is normal in structure. Trace mitral valve regurgitation. No evidence of mitral stenosis.  8. The tricuspid valve is normal in structure. Tricuspid valve regurgitation is trivial.  9. The aortic valve is tricuspid Aortic valve regurgitation was not visualized by color flow Doppler. Mild aortic valve sclerosis without stenosis. 10. TR signal is inadequate for assessing pulmonary artery systolic pressure. 11. The inferior vena cava is normal in size with greater than 50% respiratory variability, suggesting right atrial pressure of 3 mmHg.   EKG:  EKG repeated today  Recent Labs: 10/05/2018: ALT 17; BUN 5; Creatinine, Ser 0.54; Hemoglobin 12.5; Platelets 152; Potassium 4.5; Sodium 130  Recent Lipid Panel No results found for: CHOL, TRIG, HDL, CHOLHDL, VLDL, LDLCALC, LDLDIRECT  Physical Exam:    VS:  BP 126/82   Pulse 66   Ht 5' 4.5" (1.638 m)   Wt 191 lb 9.6 oz (86.9 kg)   SpO2 100%   BMI 32.38 kg/m     Wt Readings from  Last 3 Encounters:  02/09/19 191 lb 9.6 oz (86.9 kg)  12/19/18 190 lb 6.4 oz (86.4 kg)  10/05/18 181 lb (82.1 kg)     GEN: Moderate obesity. No acute distress HEENT: Normal NECK: No JVD. LYMPHATICS: No lymphadenopathy CARDIAC: Irregularly irregular with heart rate greater than 60. RR without murmur, gallop, or edema. VASCULAR:  Normal Pulses. No bruits. RESPIRATORY:  Clear to auscultation without rales, wheezing or rhonchi  ABDOMEN: Soft, non-tender, non-distended, No pulsatile mass, MUSCULOSKELETAL: No deformity  SKIN: Warm and dry NEUROLOGIC:  Alert and oriented x 3 PSYCHIATRIC:  Normal affect   ASSESSMENT:    1. Paroxysmal atrial fibrillation (HCC)   2. Snoring   3. Right bundle branch block   4. Slow heart rate   5. Essential hypertension   6. Coronary artery calcification   7. Educated about COVID-19 virus infection    PLAN:    In order of problems listed above:  1. Atrial fibrillation appears to be continuous.  This is on the background of what I believed to be is significant conduction system disease/sinus node dysfunction.  She also has underlying right bundle branch block.  We will plan electrical cardioversion.  Will discuss with electrophysiology whether we should be approaching this differently.  Certainly want to avoid the possibility of cardioversion to asystole given the episode of severe sinus bradycardia and July 2020. 2. Suspect sleep apnea which may be the source of the patient's fatigue.  Could also be the etiology of atrial fibrillation.  Will need to have a sleep study. 3. Present on the 2 most recent EKGs 4. Noted but no history of syncope. 5. Adequate blood pressure control. 6. Coronary calcification on CT scan.  I am concerned about her complaint of chest discomfort.  She will need to have an ischemic evaluation.  The chest discomfort that she has has been going on over the past 10 years.  She feels it improves when she is consistent with taking H2  blocker or proton pump inhibitor therapy.  If reestablish sinus rhythm we will do a coronary CTA with FFR.  If she rapidly returns to atrial for ablation, she will have a myocardial perfusion study. 7. The 3W's is discussed with the patient in detail and is being practiced to avoid COVID-19 infection.  The natural history of atrial fibrillation was discussed.  The inability to cure and the possibility of recurrences was clearly stated.  Management strategies including rhythm control (antiarrhythmic therapy), rate control (beta-blocker therapy or AV node blocking calcium channel blocker therapy), and ablation and/or pacemaker therapy were reviewed.  Stroke risk (as determined by CHADS VASC score >1) was discussed relative to the patient's individual profile.  The expected duration/permanence of anti-coagulation therapy was determined based on the individual risk score.  Coumadin versus NOAC therapy was reviewed, highlighting the lower bleeding risk and improved safety with NOAC therapy.  Since she has had slow heart rates, the diagnosis of sinus node dysfunction and Tacby-Brady Syndrome has been discussed which would imply the possibility of eventual permanent pacemaker implantation.   The risks and benefits of atrial for ablation were discussed in detail.  This includes the risk of musculoskeletal injury, possibility of intubation, induction of severe arrhythmias that could be life-threatening, skin injury, and anesthesia related problems.  After cardioversion she should follow-up in the A. fib clinic in 1 week.  If bradycardia has been an issue, or if A. fib has recurred quickly, and EP consultation for consideration of permanent pacemaker implantation and antiarrhythmic therapy should then be considered.   Medication Adjustments/Labs and Tests Ordered: Current medicines are reviewed at length with the patient today.  Concerns regarding medicines are outlined above.  Orders Placed This Encounter   Procedures  . Basic metabolic panel  . CBC  . Split night study   No orders of the defined types were placed in this encounter.   Patient Instructions  Medication Instructions:  Your physician recommends that you continue on your current medications as directed. Please refer to the Current Medication list given to you today.  *If you need a refill on your cardiac medications before your next appointment, please call your pharmacy*  Lab Work: BMET and CBC today  If you have labs (blood work) drawn today and your tests are completely normal, you will receive your results only by: Marland Kitchen MyChart Message (if you have MyChart) OR . A paper copy in the mail If you have any lab test that is abnormal or we need to change your treatment, we will call you to review the results.  Testing/Procedures: Your physician has recommended that you have a Cardioversion (DCCV). Electrical Cardioversion uses a jolt of electricity to your heart either through paddles or wired patches attached to your chest. This is a controlled, usually prescheduled, procedure. Defibrillation is done under light anesthesia in the hospital, and you usually go home the day of the procedure. This is done to get your heart back into a normal rhythm. You are not awake for the procedure. Please see the instruction sheet given to you today.   Follow-Up:  Your physician recommends that you schedule a follow-up appointment in: 1-2 weeks after Cardioversion with the Afib Clinic.   At [facility], you and your health needs are our priority.  As part of our continuing mission to provide you with exceptional heart care, we have created designated Provider Care Teams.  These Care Teams include your primary Cardiologist (physician) and Advanced Practice Providers (APPs -  Physician  Assistants and Nurse Practitioners) who all work together to provide you with the care you need, when you need it.  Your next appointment:   1 month(s) (Can  have 1/8 or 1/14 at 11:40A)   The format for your next appointment:   In Person  Provider:   You may see Sinclair Grooms, MD or one of the following Advanced Practice Providers on your designated Care Team:    Truitt Merle, NP  Cecilie Kicks, NP  Kathyrn Drown, NP   Other Instructions  COVID SCREENING INFORMATION: You are scheduled for your COVID screening on ______ at ______. [facility] ([facility]) [address] Stay in the RIGHT lane and proceed under the brick awning (NOT the tent) and tell them you are there for pre-procedure testing Do NOT bring any pets with you to the testing site       Signed, Sinclair Grooms, MD  02/09/2019 1:39 PM    [place]

## 2019-02-08 NOTE — H&P (View-Only) (Signed)
Cardiology Office Note:    Date:  02/09/2019   ID:  Sheila Gardner, DOB 12/28/1935, MRN [id number]  PCP:  Lawerance Cruel, MD  Cardiologist:  Sinclair Grooms, MD   Referring MD: Lawerance Cruel, MD   Chief Complaint  Patient presents with  . Atrial Fibrillation  . Chest Pain    History of Present Illness:    Sheila Gardner is a 83 y.o. female with a hx of weakness, nausea, bradycardia, right bundle branch block on EKG, chronic anticoagulation, and AF.Marland Kitchen  She returns today in follow-up after having worn an event monitor for 1 month and completed an echocardiogram.  The results of the studies as noted below.  The patient has a history dating back to July 30 when she visited the emergency room, experiencing chest discomfort and "just feeling bad".  EKG performed at that time demonstrated severe sinus bradycardia at rate of 44 bpm and with new right bundle branch block.  She was not admitted to the hospital.  Heart rate improved.  She was discharged.  In the interim she has had intermittent episodes of fatigue similar to the emergency room visit but has not been as severe.  In October she was diagnosed with atrial fibrillation by her primary physician Dr. Melinda Crutch.  She was seen by cardiology on December 19, 2018.  A monitor was placed and apixaban started.  She is back today for follow-up.  Still has exertional fatigue.  She has not had syncope or any significant prolonged weak spells as before.  Additionally today, we discussed snoring which has been a significant problem that led her husband to sleep in a separate bedroom before he passed away 5 years ago.  Frequently lethargic and tired after she awakens in the mornings.  Past Medical History:  Diagnosis Date  . Arthritis   . Breast cancer ([place])   . Cancer ([place])    Breast- rt  . Cough with sputum 2016  . Elevated liver function tests 05/09/2013  . GERD (gastroesophageal reflux disease)    COSTOCHONDRITIS  . Heart murmur   .  Hip pain 05/08/2014  . Hyperlipidemia   . Hypertension   . Lipoma    back of neck  . PONV (postoperative nausea and vomiting)    only after rt hip surgery  . Primary osteoarthritis of left knee 11/04/2015  . Primary osteoarthritis of right hip 07/30/2014    Past Surgical History:  Procedure Laterality Date  . ANKLE FRACTURE SURGERY Right 2013   RIGHT   . BREAST LUMPECTOMY Right 2011  . BREAST SURGERY  04/22/2009   Rt lumpectomy  . EYE SURGERY  2001   macular hole  . JOINT REPLACEMENT  2016  . MIDDLE EAR SURGERY Left 08/18/2015   repair eardrum and reconstruction  . OVARY SURGERY  40 years ago   wedge  . TOTAL HIP ARTHROPLASTY Right 07/30/2014   Procedure: TOTAL HIP ARTHROPLASTY ANTERIOR APPROACH;  Surgeon: Melrose Nakayama, MD;  Location: [facility];  Service: Orthopedics;  Laterality: Right;  . TOTAL KNEE ARTHROPLASTY Left 11/04/2015  . TOTAL KNEE ARTHROPLASTY Left 11/04/2015   Procedure: TOTAL KNEE ARTHROPLASTY;  Surgeon: Melrose Nakayama, MD;  Location: [facility];  Service: Orthopedics;  Laterality: Left;  . TYMPANOPLASTY  30 years ago    Current Medications: Current Meds  Medication Sig  . acetaminophen (TYLENOL) 500 MG tablet Take 500-1,000 mg by mouth every 6 (six) hours as needed for mild pain or headache.  Marland Kitchen apixaban (  ELIQUIS) 5 MG TABS tablet Take 1 tablet (5 mg total) by mouth 2 (two) times daily.  Marland Kitchen BIOTIN PO Take 1 tablet by mouth daily with breakfast.  . Cholecalciferol (VITAMIN D-3) 1000 UNITS CAPS Take 1,000 Units by mouth daily with breakfast.   . cyanocobalamin 500 MCG tablet Take 500 mcg by mouth daily.  Marland Kitchen LORazepam (ATIVAN) 1 MG tablet Take 1 mg by mouth See admin instructions. Take 1 mg by mouth at bedtime and an additional 1 mg once a day as needed for anxiety  . losartan (COZAAR) 100 MG tablet Take 100 mg by mouth daily.   Marland Kitchen omeprazole (PRILOSEC) 20 MG capsule Take 20 mg by mouth daily before breakfast.   . oxybutynin (DITROPAN) 5 MG tablet Take 5 mg by mouth as needed.   . potassium chloride (K-DUR) 10 MEQ tablet Take 20 mEq by mouth daily.   . pravastatin (PRAVACHOL) 20 MG tablet Take 20 mg by mouth daily.  Marland Kitchen pyrithione zinc (HEAD AND SHOULDERS) 1 % shampoo Apply topically daily as needed for itching.  . vitamin C (ASCORBIC ACID) 500 MG tablet Take 500 mg by mouth daily.     Allergies:   Patient has no known allergies.   Social History   Socioeconomic History  . Marital status: Widowed    Spouse name: Not on file  . Number of children: Not on file  . Years of education: Not on file  . Highest education level: Not on file  Occupational History  . Not on file  Social Needs  . Financial resource strain: Not on file  . Food insecurity    Worry: Not on file    Inability: Not on file  . Transportation needs    Medical: Not on file    Non-medical: Not on file  Tobacco Use  . Smoking status: Never Smoker  . Smokeless tobacco: Never Used  Substance and Sexual Activity  . Alcohol use: Yes    Comment: OCC -none now  . Drug use: No  . Sexual activity: Not on file  Lifestyle  . Physical activity    Days per week: Not on file    Minutes per session: Not on file  . Stress: Not on file  Relationships  . Social Herbalist on phone: Not on file    Gets together: Not on file    Attends religious service: Not on file    Active member of club or organization: Not on file    Attends meetings of clubs or organizations: Not on file    Relationship status: Not on file  Other Topics Concern  . Not on file  Social History Narrative  . Not on file     Family History: The patient's family history includes Cancer in her brother; Heart disease in her mother.  ROS:   Please see the history of present illness.    Has had previous work-up for ischemic heart disease.  Has had chest pressure dating back greater than 10 years.  Previous evaluation by Dr. Marlou Porch all other systems reviewed and are negative.  EKGs/Labs/Other Studies Reviewed:     The following studies were reviewed today: Cardiac event monitor 12/27/2018: IMPRESSIONS  Continuous atrial for ablation  Heart rate ranges between 50 and 120 bpm.  Nocturnal pauses greater than 3 seconds but less than 4 seconds. No significant pauses during waking hours.  TACHYCARDIA Bradycardia syndrome    2D Doppler echocardiogram 12/27/2018: IMPRESSIONS    1. Left ventricular  ejection fraction, by visual estimation, is 65 to 70%. The left ventricle has hyperdynamic function. Normal left ventricular size. There is moderately increased left ventricular hypertrophy. Normal wall motion.  2. Left ventricular diastolic Doppler parameters are indeterminate pattern of LV diastolic filling.  3. Global right ventricle has normal systolic function.The right ventricular size is normal. No increase in right ventricular wall thickness.  4. Left atrial size was normal.  5. Right atrial size was normal.  6. Mild mitral annular calcification.  7. The mitral valve is normal in structure. Trace mitral valve regurgitation. No evidence of mitral stenosis.  8. The tricuspid valve is normal in structure. Tricuspid valve regurgitation is trivial.  9. The aortic valve is tricuspid Aortic valve regurgitation was not visualized by color flow Doppler. Mild aortic valve sclerosis without stenosis. 10. TR signal is inadequate for assessing pulmonary artery systolic pressure. 11. The inferior vena cava is normal in size with greater than 50% respiratory variability, suggesting right atrial pressure of 3 mmHg.   EKG:  EKG repeated today  Recent Labs: 10/05/2018: ALT 17; BUN 5; Creatinine, Ser 0.54; Hemoglobin 12.5; Platelets 152; Potassium 4.5; Sodium 130  Recent Lipid Panel No results found for: CHOL, TRIG, HDL, CHOLHDL, VLDL, LDLCALC, LDLDIRECT  Physical Exam:    VS:  BP 126/82   Pulse 66   Ht 5' 4.5" (1.638 m)   Wt 191 lb 9.6 oz (86.9 kg)   SpO2 100%   BMI 32.38 kg/m     Wt Readings  from Last 3 Encounters:  02/09/19 191 lb 9.6 oz (86.9 kg)  12/19/18 190 lb 6.4 oz (86.4 kg)  10/05/18 181 lb (82.1 kg)     GEN: Moderate obesity. No acute distress HEENT: Normal NECK: No JVD. LYMPHATICS: No lymphadenopathy CARDIAC: Irregularly irregular with heart rate greater than 60. RR without murmur, gallop, or edema. VASCULAR:  Normal Pulses. No bruits. RESPIRATORY:  Clear to auscultation without rales, wheezing or rhonchi  ABDOMEN: Soft, non-tender, non-distended, No pulsatile mass, MUSCULOSKELETAL: No deformity  SKIN: Warm and dry NEUROLOGIC:  Alert and oriented x 3 PSYCHIATRIC:  Normal affect   ASSESSMENT:    1. Paroxysmal atrial fibrillation (HCC)   2. Snoring   3. Right bundle branch block   4. Slow heart rate   5. Essential hypertension   6. Coronary artery calcification   7. Educated about COVID-19 virus infection    PLAN:    In order of problems listed above:  1. Atrial fibrillation appears to be continuous.  This is on the background of what I believed to be is significant conduction system disease/sinus node dysfunction.  She also has underlying right bundle branch block.  We will plan electrical cardioversion.  Will discuss with electrophysiology whether we should be approaching this differently.  Certainly want to avoid the possibility of cardioversion to asystole given the episode of severe sinus bradycardia and July 2020. 2. Suspect sleep apnea which may be the source of the patient's fatigue.  Could also be the etiology of atrial fibrillation.  Will need to have a sleep study. 3. Present on the 2 most recent EKGs 4. Noted but no history of syncope. 5. Adequate blood pressure control. 6. Coronary calcification on CT scan.  I am concerned about her complaint of chest discomfort.  She will need to have an ischemic evaluation.  The chest discomfort that she has has been going on over the past 10 years.  She feels it improves when she is consistent with taking H2  blocker or proton pump inhibitor therapy.  If reestablish sinus rhythm we will do a coronary CTA with FFR.  If she rapidly returns to atrial for ablation, she will have a myocardial perfusion study. 7. The 3W's is discussed with the patient in detail and is being practiced to avoid COVID-19 infection.  The natural history of atrial fibrillation was discussed.  The inability to cure and the possibility of recurrences was clearly stated.  Management strategies including rhythm control (antiarrhythmic therapy), rate control (beta-blocker therapy or AV node blocking calcium channel blocker therapy), and ablation and/or pacemaker therapy were reviewed.  Stroke risk (as determined by CHADS VASC score >1) was discussed relative to the patient's individual profile.  The expected duration/permanence of anti-coagulation therapy was determined based on the individual risk score.  Coumadin versus NOAC therapy was reviewed, highlighting the lower bleeding risk and improved safety with NOAC therapy.  Since she has had slow heart rates, the diagnosis of sinus node dysfunction and Tacby-Brady Syndrome has been discussed which would imply the possibility of eventual permanent pacemaker implantation.   The risks and benefits of atrial for ablation were discussed in detail.  This includes the risk of musculoskeletal injury, possibility of intubation, induction of severe arrhythmias that could be life-threatening, skin injury, and anesthesia related problems.  After cardioversion she should follow-up in the A. fib clinic in 1 week.  If bradycardia has been an issue, or if A. fib has recurred quickly, and EP consultation for consideration of permanent pacemaker implantation and antiarrhythmic therapy should then be considered.   Medication Adjustments/Labs and Tests Ordered: Current medicines are reviewed at length with the patient today.  Concerns regarding medicines are outlined above.  Orders Placed This Encounter    Procedures  . Basic metabolic panel  . CBC  . Split night study   No orders of the defined types were placed in this encounter.   Patient Instructions  Medication Instructions:  Your physician recommends that you continue on your current medications as directed. Please refer to the Current Medication list given to you today.  *If you need a refill on your cardiac medications before your next appointment, please call your pharmacy*  Lab Work: BMET and CBC today  If you have labs (blood work) drawn today and your tests are completely normal, you will receive your results only by: Marland Kitchen MyChart Message (if you have MyChart) OR . A paper copy in the mail If you have any lab test that is abnormal or we need to change your treatment, we will call you to review the results.  Testing/Procedures: Your physician has recommended that you have a Cardioversion (DCCV). Electrical Cardioversion uses a jolt of electricity to your heart either through paddles or wired patches attached to your chest. This is a controlled, usually prescheduled, procedure. Defibrillation is done under light anesthesia in the hospital, and you usually go home the day of the procedure. This is done to get your heart back into a normal rhythm. You are not awake for the procedure. Please see the instruction sheet given to you today.   Follow-Up:  Your physician recommends that you schedule a follow-up appointment in: 1-2 weeks after Cardioversion with the Afib Clinic.   At [facility], you and your health needs are our priority.  As part of our continuing mission to provide you with exceptional heart care, we have created designated Provider Care Teams.  These Care Teams include your primary Cardiologist (physician) and Advanced Practice Providers (APPs -  Physician Assistants and Nurse Practitioners) who all work together to provide you with the care you need, when you need it.  Your next appointment:   1 month(s) (Can  have 1/8 or 1/14 at 11:40A)   The format for your next appointment:   In Person  Provider:   You may see Sinclair Grooms, MD or one of the following Advanced Practice Providers on your designated Care Team:    Truitt Merle, NP  Cecilie Kicks, NP  Kathyrn Drown, NP   Other Instructions  COVID SCREENING INFORMATION: You are scheduled for your COVID screening on ______ at ______. [facility] ([facility]) [address] Stay in the RIGHT lane and proceed under the brick awning (NOT the tent) and tell them you are there for pre-procedure testing Do NOT bring any pets with you to the testing site       Signed, Sinclair Grooms, MD  02/09/2019 1:39 PM    [place]

## 2019-02-09 ENCOUNTER — Encounter: Payer: Self-pay | Admitting: *Deleted

## 2019-02-09 ENCOUNTER — Other Ambulatory Visit: Payer: Self-pay

## 2019-02-09 ENCOUNTER — Encounter: Payer: Self-pay | Admitting: Interventional Cardiology

## 2019-02-09 ENCOUNTER — Telehealth: Payer: Self-pay | Admitting: *Deleted

## 2019-02-09 ENCOUNTER — Ambulatory Visit (INDEPENDENT_AMBULATORY_CARE_PROVIDER_SITE_OTHER): Payer: Medicare Other | Admitting: Interventional Cardiology

## 2019-02-09 VITALS — BP 126/82 | HR 66 | Ht 64.5 in | Wt 191.6 lb

## 2019-02-09 DIAGNOSIS — I4510 Unspecified right bundle-branch block: Secondary | ICD-10-CM

## 2019-02-09 DIAGNOSIS — I2584 Coronary atherosclerosis due to calcified coronary lesion: Secondary | ICD-10-CM

## 2019-02-09 DIAGNOSIS — I480 Paroxysmal atrial fibrillation: Secondary | ICD-10-CM

## 2019-02-09 DIAGNOSIS — R001 Bradycardia, unspecified: Secondary | ICD-10-CM

## 2019-02-09 DIAGNOSIS — R0683 Snoring: Secondary | ICD-10-CM

## 2019-02-09 DIAGNOSIS — I2510 Atherosclerotic heart disease of native coronary artery without angina pectoris: Secondary | ICD-10-CM

## 2019-02-09 DIAGNOSIS — I10 Essential (primary) hypertension: Secondary | ICD-10-CM

## 2019-02-09 DIAGNOSIS — Z7189 Other specified counseling: Secondary | ICD-10-CM

## 2019-02-09 NOTE — Telephone Encounter (Signed)
-----   Message from Loren Racer, RN sent at 02/09/2019 12:46 PM EST ----- Sleep study ordered for pt

## 2019-02-09 NOTE — Patient Instructions (Addendum)
Medication Instructions:  Your physician recommends that you continue on your current medications as directed. Please refer to the Current Medication list given to you today.  *If you need a refill on your cardiac medications before your next appointment, please call your pharmacy*  Lab Work: BMET and CBC today  If you have labs (blood work) drawn today and your tests are completely normal, you will receive your results only by: Marland Kitchen MyChart Message (if you have MyChart) OR . A paper copy in the mail If you have any lab test that is abnormal or we need to change your treatment, we will call you to review the results.  Testing/Procedures: Your physician has recommended that you have a Cardioversion (DCCV). Electrical Cardioversion uses a jolt of electricity to your heart either through paddles or wired patches attached to your chest. This is a controlled, usually prescheduled, procedure. Defibrillation is done under light anesthesia in the hospital, and you usually go home the day of the procedure. This is done to get your heart back into a normal rhythm. You are not awake for the procedure. Please see the instruction sheet given to you today.   Follow-Up:  Your physician recommends that you schedule a follow-up appointment in: 1-2 weeks after Cardioversion with the Afib Clinic.   At [facility], you and your health needs are our priority.  As part of our continuing mission to provide you with exceptional heart care, we have created designated Provider Care Teams.  These Care Teams include your primary Cardiologist (physician) and Advanced Practice Providers (APPs -  Physician Assistants and Nurse Practitioners) who all work together to provide you with the care you need, when you need it.  Your next appointment:   1 month(s) (Can have 1/8 or 1/14 at 11:40A)   The format for your next appointment:   In Person  Provider:   You may see Sinclair Grooms, MD or one of the following Advanced  Practice Providers on your designated Care Team:    Truitt Merle, NP  Cecilie Kicks, NP  Kathyrn Drown, NP   Other Instructions  COVID SCREENING INFORMATION: You are scheduled for your COVID screening on ______ at ______. [place] (old [facility]) [address] Stay in the RIGHT lane and proceed under the brick awning (NOT the tent) and tell them you are there for pre-procedure testing Do NOT bring any pets with you to the testing site

## 2019-02-10 LAB — BASIC METABOLIC PANEL
BUN/Creatinine Ratio: 23 (ref 12–28)
BUN: 13 mg/dL (ref 8–27)
CO2: 26 mmol/L (ref 20–29)
Calcium: 9.6 mg/dL (ref 8.7–10.3)
Chloride: 98 mmol/L (ref 96–106)
Creatinine, Ser: 0.57 mg/dL (ref 0.57–1.00)
GFR calc Af Amer: 101 mL/min/{1.73_m2} (ref >59)
GFR calc non Af Amer: 87 mL/min/{1.73_m2} (ref >59)
Glucose: 103 mg/dL — ABNORMAL HIGH (ref 65–99)
Potassium: 4 mmol/L (ref 3.5–5.2)
Sodium: 138 mmol/L (ref 134–144)

## 2019-02-10 LAB — CBC
Hematocrit: 37.6 % (ref 34.0–46.6)
Hemoglobin: 12.5 g/dL (ref 11.1–15.9)
MCH: 30.3 pg (ref 26.6–33.0)
MCHC: 33.2 g/dL (ref 31.5–35.7)
MCV: 91 fL (ref 79–97)
Platelets: 270 10*3/uL (ref 150–450)
RBC: 4.13 x10E6/uL (ref 3.77–5.28)
RDW: 12.4 % (ref 11.7–15.4)
WBC: 7.7 10*3/uL (ref 3.4–10.8)

## 2019-02-14 DIAGNOSIS — G5603 Carpal tunnel syndrome, bilateral upper limbs: Secondary | ICD-10-CM | POA: Diagnosis not present

## 2019-02-14 DIAGNOSIS — M79642 Pain in left hand: Secondary | ICD-10-CM | POA: Diagnosis not present

## 2019-02-14 DIAGNOSIS — M79641 Pain in right hand: Secondary | ICD-10-CM | POA: Diagnosis not present

## 2019-02-16 ENCOUNTER — Other Ambulatory Visit (HOSPITAL_COMMUNITY)
Admission: RE | Admit: 2019-02-16 | Discharge: 2019-02-16 | Disposition: A | Discharge status: Home / Self Care | Payer: Medicare Other | Source: Ambulatory Visit | Attending: Cardiology | Admitting: Cardiology

## 2019-02-16 DIAGNOSIS — Z01812 Encounter for preprocedural laboratory examination: Secondary | ICD-10-CM | POA: Diagnosis present

## 2019-02-16 DIAGNOSIS — Z20828 Contact with and (suspected) exposure to other viral communicable diseases: Secondary | ICD-10-CM | POA: Insufficient documentation

## 2019-02-16 LAB — SARS CORONAVIRUS 2 (TAT 6-24 HRS): SARS Coronavirus 2: NEGATIVE

## 2019-02-17 ENCOUNTER — Other Ambulatory Visit: Payer: Self-pay | Admitting: Interventional Cardiology

## 2019-02-18 NOTE — Anesthesia Preprocedure Evaluation (Addendum)
Anesthesia Evaluation  Patient identified by MRN, date of birth, ID band Patient awake    Reviewed: Allergy & Precautions, NPO status , Patient's Chart, lab work & pertinent test results  History of Anesthesia Complications (+) PONV and history of anesthetic complications  Airway Mallampati: II  TM Distance: >3 FB Neck ROM: Full    Dental  (+) Dental Advisory Given, Partial Lower, Partial Upper   Pulmonary neg pulmonary ROS,    breath sounds clear to auscultation       Cardiovascular hypertension, Pt. on medications + dysrhythmias Atrial Fibrillation  Rhythm:Irregular Rate:Normal  IMPRESSIONS    1. Left ventricular ejection fraction, by visual estimation, is 65 to 70%. The left ventricle has hyperdynamic function. Normal left ventricular size. There is moderately increased left ventricular hypertrophy. Normal wall motion.  2. Left ventricular diastolic Doppler parameters are indeterminate pattern of LV diastolic filling.  3. Global right ventricle has normal systolic function.The right ventricular size is normal. No increase in right ventricular wall thickness.  4. Left atrial size was normal.  5. Right atrial size was normal.  6. Mild mitral annular calcification.  7. The mitral valve is normal in structure. Trace mitral valve regurgitation. No evidence of mitral stenosis.  8. The tricuspid valve is normal in structure. Tricuspid valve regurgitation is trivial.  9. The aortic valve is tricuspid Aortic valve regurgitation was not visualized by color flow Doppler. Mild aortic valve sclerosis without stenosis. 10. TR signal is inadequate for assessing pulmonary artery systolic pressure. 11. The inferior vena cava is normal in size with greater than 50% respiratory variability, suggesting right atrial pressure of 3 mmHg.    Neuro/Psych negative neurological ROS  negative psych ROS   GI/Hepatic Neg liver ROS, GERD  Medicated  and Controlled,  Endo/Other  negative endocrine ROS  Renal/GU negative Renal ROS  negative genitourinary   Musculoskeletal  (+) Arthritis , Osteoarthritis,    Abdominal   Peds negative pediatric ROS (+)  Hematology negative hematology ROS (+)   Anesthesia Other Findings   Reproductive/Obstetrics negative OB ROS                            Lab Results  Component Value Date   WBC 7.7 02/09/2019   HGB 12.5 02/09/2019   HCT 37.6 02/09/2019   MCV 91 02/09/2019   PLT 270 02/09/2019   Lab Results  Component Value Date   CREATININE 0.57 02/09/2019   BUN 13 02/09/2019   NA 138 02/09/2019   K 4.0 02/09/2019   CL 98 02/09/2019   CO2 26 02/09/2019   Lab Results  Component Value Date   INR 1.02 10/24/2015   INR 1.01 07/19/2014   07/2014 EKG: normal sinus rhythm.  Anesthesia Physical  Anesthesia Plan  ASA: III  Anesthesia Plan: General   Post-op Pain Management:    Induction: Intravenous  PONV Risk Score and Plan: Treatment may vary due to age or medical condition  Airway Management Planned: Mask  Additional Equipment:   Intra-op Plan:   Post-operative Plan:   Informed Consent: I have reviewed the patients History and Physical, chart, labs and discussed the procedure including the risks, benefits and alternatives for the proposed anesthesia with the patient or authorized representative who has indicated his/her understanding and acceptance.     Dental advisory given  Plan Discussed with: CRNA and Anesthesiologist  Anesthesia Plan Comments:       Anesthesia Quick Evaluation

## 2019-02-19 ENCOUNTER — Ambulatory Visit (HOSPITAL_COMMUNITY): Payer: Medicare Other | Admitting: Anesthesiology

## 2019-02-19 ENCOUNTER — Encounter (HOSPITAL_COMMUNITY): Payer: Self-pay | Admitting: Cardiology

## 2019-02-19 ENCOUNTER — Ambulatory Visit (HOSPITAL_COMMUNITY)
Admission: RE | Admit: 2019-02-19 | Discharge: 2019-02-19 | Disposition: A | Discharge status: Home / Self Care | Payer: Medicare Other | Attending: Cardiology | Admitting: Cardiology

## 2019-02-19 ENCOUNTER — Encounter (HOSPITAL_COMMUNITY)
Admission: RE | Disposition: A | Discharge status: Home / Self Care | Payer: Self-pay | Source: Home / Self Care | Attending: Cardiology

## 2019-02-19 ENCOUNTER — Other Ambulatory Visit: Payer: Self-pay

## 2019-02-19 DIAGNOSIS — Z7901 Long term (current) use of anticoagulants: Secondary | ICD-10-CM | POA: Diagnosis not present

## 2019-02-19 DIAGNOSIS — R0683 Snoring: Secondary | ICD-10-CM | POA: Insufficient documentation

## 2019-02-19 DIAGNOSIS — I2510 Atherosclerotic heart disease of native coronary artery without angina pectoris: Secondary | ICD-10-CM | POA: Diagnosis not present

## 2019-02-19 DIAGNOSIS — E785 Hyperlipidemia, unspecified: Secondary | ICD-10-CM | POA: Insufficient documentation

## 2019-02-19 DIAGNOSIS — I4891 Unspecified atrial fibrillation: Secondary | ICD-10-CM | POA: Diagnosis not present

## 2019-02-19 DIAGNOSIS — Z79899 Other long term (current) drug therapy: Secondary | ICD-10-CM | POA: Insufficient documentation

## 2019-02-19 DIAGNOSIS — K219 Gastro-esophageal reflux disease without esophagitis: Secondary | ICD-10-CM | POA: Diagnosis not present

## 2019-02-19 DIAGNOSIS — I10 Essential (primary) hypertension: Secondary | ICD-10-CM | POA: Insufficient documentation

## 2019-02-19 DIAGNOSIS — I4510 Unspecified right bundle-branch block: Secondary | ICD-10-CM | POA: Diagnosis not present

## 2019-02-19 DIAGNOSIS — Z853 Personal history of malignant neoplasm of breast: Secondary | ICD-10-CM | POA: Insufficient documentation

## 2019-02-19 DIAGNOSIS — I480 Paroxysmal atrial fibrillation: Secondary | ICD-10-CM | POA: Diagnosis present

## 2019-02-19 HISTORY — PX: CARDIOVERSION: SHX1299

## 2019-02-19 SURGERY — CARDIOVERSION
Anesthesia: General

## 2019-02-19 MED ORDER — SODIUM CHLORIDE 0.9 % IV SOLN
INTRAVENOUS | Status: DC | PRN
  Administered 2019-02-19: 09:00:00 via INTRAVENOUS

## 2019-02-19 MED ORDER — PROPOFOL 10 MG/ML IV BOLUS
INTRAVENOUS | Status: DC | PRN
  Administered 2019-02-19: 70 mg via INTRAVENOUS

## 2019-02-19 MED ORDER — LIDOCAINE 2% (20 MG/ML) 5 ML SYRINGE
INTRAMUSCULAR | Status: DC | PRN
  Administered 2019-02-19: 60 mg via INTRAVENOUS

## 2019-02-19 NOTE — Interval H&P Note (Signed)
History and Physical Interval Note:  02/19/2019 8:13 AM  Sheila Gardner  has presented today for surgery, with the diagnosis of ATRIAL FIBRILLATION.  The various methods of treatment have been discussed with the patient and family. After consideration of risks, benefits and other options for treatment, the patient has consented to  Procedure(s): CARDIOVERSION (N/A) as a surgical intervention.  The patient's history has been reviewed, patient examined, no change in status, stable for surgery.  I have reviewed the patient's chart and labs.  Questions were answered to the patient's satisfaction.     Ena Dawley

## 2019-02-19 NOTE — Discharge Instructions (Signed)

## 2019-02-19 NOTE — Transfer of Care (Signed)
Immediate Anesthesia Transfer of Care Note  Patient: Sheila Gardner  Procedure(s) Performed: CARDIOVERSION (N/A )  Patient Location: Endoscopy Unit  Anesthesia Type:General  Level of Consciousness: drowsy and patient cooperative  Airway & Oxygen Therapy: Patient Spontanous Breathing  Post-op Assessment: Report given to RN, Post -op Vital signs reviewed and stable and Patient moving all extremities X 4  Post vital signs: Reviewed and stable  Last Vitals:  Vitals Value Taken Time  BP    Temp    Pulse    Resp    SpO2      Last Pain:  Vitals:   02/19/19 0739  PainSc: 0-No pain         Complications: No apparent anesthesia complications

## 2019-02-19 NOTE — CV Procedure (Signed)
    Cardioversion Note  MAUDE SPITALE [id number] 12-03-1935  Procedure: DC Cardioversion Indications: atrial fibrillation  Procedure Details Consent: Obtained Time Out: Verified patient identification, verified procedure, site/side was marked, verified correct patient position, special equipment/implants available, Radiology Safety Procedures followed,  medications/allergies/relevent history reviewed, required imaging and test results available.  Performed  The patient has been on adequate anticoagulation.  The patient received IV propofol administered by anesthesia staff for sedation.  Synchronous cardioversion was performed at 120 joules.  The cardioversion was successful.   Complications: No apparent complications Patient did tolerate procedure well.   Ena Dawley, MD, [facility] 02/19/2019, 8:47 AM

## 2019-02-19 NOTE — Anesthesia Postprocedure Evaluation (Signed)
Anesthesia Post Note  Patient: Sheila Gardner  Procedure(s) Performed: CARDIOVERSION (N/A )     Patient location during evaluation: Endoscopy Anesthesia Type: General Level of consciousness: sedated Pain management: pain level controlled Vital Signs Assessment: post-procedure vital signs reviewed and stable Respiratory status: spontaneous breathing and respiratory function stable Cardiovascular status: stable Postop Assessment: no apparent nausea or vomiting Anesthetic complications: no    Last Vitals:  Vitals:   02/19/19 0905 02/19/19 0915  BP: (!) 129/58 (!) 144/57  Pulse: (!) 54 66  Resp: 17 16  Temp:    SpO2: 98% 100%    Last Pain:  Vitals:   02/19/19 0915  TempSrc:   PainSc: 0-No pain                 Aarit Kashuba DANIEL

## 2019-02-22 ENCOUNTER — Telehealth: Payer: Self-pay | Admitting: Interventional Cardiology

## 2019-02-22 ENCOUNTER — Telehealth: Payer: Self-pay | Admitting: *Deleted

## 2019-02-22 MED ORDER — APIXABAN 5 MG PO TABS: 5.0000 mg | ORAL_TABLET | Freq: Two times a day (BID) | ORAL | 0 refills | Status: DC

## 2019-02-22 NOTE — Telephone Encounter (Signed)
Spoke with pt, advised pt of Patient assistance program for BMS.  Pt states she only needs to get through end of year and then she will be able to afford out of pocket cost.  Providing 2 weeks samples Lot # [id number]  Exp 10/2020

## 2019-02-22 NOTE — Telephone Encounter (Signed)
Patient calling the office for samples of medication:   1.  What medication and dosage are you requesting samples for? Eliquis  2.  Are you currently out of this medication? yes    

## 2019-02-22 NOTE — Telephone Encounter (Signed)
Staff message sent to Sheila Gardner ok to schedule sleep study. No PA is required. Patient has medicare.

## 2019-03-05 ENCOUNTER — Encounter (HOSPITAL_COMMUNITY): Payer: Self-pay | Admitting: Nurse Practitioner

## 2019-03-05 ENCOUNTER — Ambulatory Visit (HOSPITAL_COMMUNITY)
Admission: RE | Admit: 2019-03-05 | Discharge: 2019-03-05 | Disposition: A | Discharge status: Home / Self Care | Payer: Medicare Other | Source: Ambulatory Visit | Attending: Nurse Practitioner | Admitting: Nurse Practitioner

## 2019-03-05 ENCOUNTER — Other Ambulatory Visit: Payer: Self-pay

## 2019-03-05 VITALS — BP 140/74 | HR 52 | Ht 64.5 in | Wt 183.0 lb

## 2019-03-05 DIAGNOSIS — I4510 Unspecified right bundle-branch block: Secondary | ICD-10-CM | POA: Insufficient documentation

## 2019-03-05 DIAGNOSIS — I10 Essential (primary) hypertension: Secondary | ICD-10-CM | POA: Diagnosis not present

## 2019-03-05 DIAGNOSIS — Z79899 Other long term (current) drug therapy: Secondary | ICD-10-CM | POA: Insufficient documentation

## 2019-03-05 DIAGNOSIS — Z8249 Family history of ischemic heart disease and other diseases of the circulatory system: Secondary | ICD-10-CM | POA: Diagnosis not present

## 2019-03-05 DIAGNOSIS — R0681 Apnea, not elsewhere classified: Secondary | ICD-10-CM | POA: Diagnosis not present

## 2019-03-05 DIAGNOSIS — E785 Hyperlipidemia, unspecified: Secondary | ICD-10-CM | POA: Diagnosis not present

## 2019-03-05 DIAGNOSIS — K219 Gastro-esophageal reflux disease without esophagitis: Secondary | ICD-10-CM | POA: Insufficient documentation

## 2019-03-05 DIAGNOSIS — D6869 Other thrombophilia: Secondary | ICD-10-CM | POA: Diagnosis not present

## 2019-03-05 DIAGNOSIS — R0683 Snoring: Secondary | ICD-10-CM | POA: Diagnosis not present

## 2019-03-05 DIAGNOSIS — I4819 Other persistent atrial fibrillation: Secondary | ICD-10-CM

## 2019-03-05 DIAGNOSIS — R001 Bradycardia, unspecified: Secondary | ICD-10-CM | POA: Insufficient documentation

## 2019-03-05 DIAGNOSIS — Z7901 Long term (current) use of anticoagulants: Secondary | ICD-10-CM | POA: Diagnosis not present

## 2019-03-05 DIAGNOSIS — Z853 Personal history of malignant neoplasm of breast: Secondary | ICD-10-CM | POA: Insufficient documentation

## 2019-03-05 NOTE — Progress Notes (Addendum)
Primary Care Physician: Lawerance Cruel, MD Referring Physician: Dr.  [name] with a h/o bradycardia,RBBB,  afib dx in October 2020, being seen in the afib clinic f/u successful cardioversion from  which pt continues to be in [place]. She was given a 30 day monitor which did show persistent  afib when it was reviewed by Dr. [name], who then set up the cardioversion. She is on eliquis 5 mg bid for a CHA2DS2VASc score of at least 4. She does not drink alcohol or use tobacco but drinks moderate caffeine and has snored  for years with apnea spells.   Today, she denies symptoms of palpitations, chest pain, shortness of breath, orthopnea, PND, lower extremity edema, dizziness, presyncope, syncope, or neurologic sequela. The patient is tolerating medications without difficulties and is otherwise without complaint today.   Past Medical History:  Diagnosis Date  . Arthritis   . Breast cancer ([place])   . Cancer ([place])    Breast- rt  . Cough with sputum 2016  . Elevated liver function tests 05/09/2013  . GERD (gastroesophageal reflux disease)    COSTOCHONDRITIS  . Heart murmur   . Hip pain 05/08/2014  . Hyperlipidemia   . Hypertension   . Lipoma    back of neck  . PONV (postoperative nausea and vomiting)    only after rt hip surgery  . Primary osteoarthritis of left knee 11/04/2015  . Primary osteoarthritis of right hip 07/30/2014   Past Surgical History:  Procedure Laterality Date  . ANKLE FRACTURE SURGERY Right 2013   RIGHT   . BREAST LUMPECTOMY Right 2011  . BREAST SURGERY  04/22/2009   Rt lumpectomy  . CARDIOVERSION N/A 02/19/2019   Procedure: CARDIOVERSION;  Surgeon: Dorothy Spark, MD;  Location: [facility] ENDOSCOPY;  Service: Cardiovascular;  Laterality: N/A;  . EYE SURGERY  2001   macular hole  . JOINT REPLACEMENT  2016  . MIDDLE EAR SURGERY Left 08/18/2015   repair eardrum and reconstruction  . OVARY SURGERY  40 years ago   wedge  . TOTAL HIP  ARTHROPLASTY Right 07/30/2014   Procedure: TOTAL HIP ARTHROPLASTY ANTERIOR APPROACH;  Surgeon: Melrose Nakayama, MD;  Location: [place];  Service: Orthopedics;  Laterality: Right;  . TOTAL KNEE ARTHROPLASTY Left 11/04/2015  . TOTAL KNEE ARTHROPLASTY Left 11/04/2015   Procedure: TOTAL KNEE ARTHROPLASTY;  Surgeon: Melrose Nakayama, MD;  Location: [place];  Service: Orthopedics;  Laterality: Left;  . TYMPANOPLASTY  30 years ago    Current Outpatient Medications  Medication Sig Dispense Refill  . acetaminophen (TYLENOL) 500 MG tablet Take 500-1,000 mg by mouth every 6 (six) hours as needed for mild pain.     Marland Kitchen acetaminophen (TYLENOL) 650 MG CR tablet Take 1,300 mg by mouth every 8 (eight) hours as needed for pain.    Marland Kitchen apixaban (ELIQUIS) 5 MG TABS tablet Take 1 tablet (5 mg total) by mouth 2 (two) times daily. 60 tablet 11  . Ascorbic Acid (VITAMIN C) 1000 MG tablet Take 1,000 mg by mouth daily.     Marland Kitchen BIOTIN PO Take 10,000 Units by mouth daily with breakfast.     . cetirizine (ZYRTEC) 10 MG tablet Take 10 mg by mouth daily as needed for allergies.    . Cholecalciferol (VITAMIN D-3) 1000 UNITS CAPS Take 1,000 Units by mouth daily with breakfast.     . cyanocobalamin 500 MCG tablet Take 1,000 mcg by mouth daily.     Marland Kitchen  esomeprazole (NEXIUM) 20 MG capsule Take 20 mg by mouth daily at 12 noon.    . ferrous sulfate 325 (65 FE) MG tablet Take 325 mg by mouth daily with breakfast.    . hydrochlorothiazide (HYDRODIURIL) 25 MG tablet Take 25 mg by mouth as needed (fluid).    . LORazepam (ATIVAN) 1 MG tablet Take 1 mg by mouth 2 (two) times daily as needed for anxiety or sleep. Take every night at bedtime    . losartan (COZAAR) 100 MG tablet Take 100 mg by mouth daily.     . magnesium oxide (MAG-OX) 400 MG tablet Take 400 mg by mouth daily. Triple    . Melatonin 10 MG TABS Take 10 mg by mouth at bedtime.    Marland Kitchen OVER THE COUNTER MEDICATION Take 1 tablet by mouth daily at 2 PM. healthy brain all day focus    . OVER THE  COUNTER MEDICATION 300 mg daily as needed (arthritis). Hemp oil    . oxybutynin (DITROPAN) 5 MG tablet Take 5 mg by mouth as needed for bladder spasms.     . potassium chloride (K-DUR) 10 MEQ tablet Take 20 mEq by mouth daily.     . pravastatin (PRAVACHOL) 20 MG tablet Take 20 mg by mouth daily.    Marland Kitchen pyridOXINE (VITAMIN B-6) 100 MG tablet Take 100 mg by mouth daily.    . vitamin E 400 UNIT capsule Take 400 Units by mouth daily.     No current facility-administered medications for this encounter.    No Known Allergies  Social History   Socioeconomic History  . Marital status: Widowed    Spouse name: Not on file  . Number of children: Not on file  . Years of education: Not on file  . Highest education level: Not on file  Occupational History  . Not on file  Tobacco Use  . Smoking status: Never Smoker  . Smokeless tobacco: Never Used  Substance and Sexual Activity  . Alcohol use: Not Currently    Comment: OCC -none now  . Drug use: No  . Sexual activity: Not on file  Other Topics Concern  . Not on file  Social History Narrative  . Not on file   Social Determinants of Health   Financial Resource Strain:   . Difficulty of Paying Living Expenses: Not on file  Food Insecurity:   . Worried About Charity fundraiser in the Last Year: Not on file  . Ran Out of Food in the Last Year: Not on file  Transportation Needs:   . Lack of Transportation (Medical): Not on file  . Lack of Transportation (Non-Medical): Not on file  Physical Activity:   . Days of Exercise per Week: Not on file  . Minutes of Exercise per Session: Not on file  Stress:   . Feeling of Stress : Not on file  Social Connections:   . Frequency of Communication with Friends and Family: Not on file  . Frequency of Social Gatherings with Friends and Family: Not on file  . Attends Religious Services: Not on file  . Active Member of Clubs or Organizations: Not on file  . Attends Archivist Meetings: Not  on file  . Marital Status: Not on file  Intimate Partner Violence:   . Fear of Current or Ex-Partner: Not on file  . Emotionally Abused: Not on file  . Physically Abused: Not on file  . Sexually Abused: Not on file    Family History  Problem Relation Age of Onset  . Heart disease Mother   . Cancer Brother        colon, pancreatic, brain    ROS- All systems are reviewed and negative except as per the HPI above  Physical Exam: Vitals:   03/05/19 1151  BP: 140/74  Pulse: (!) 52  Weight: 83 kg  Height: 5' 4.5" (1.638 m)   Wt Readings from Last 3 Encounters:  03/05/19 83 kg  02/19/19 80.3 kg  02/09/19 86.9 kg    Labs: Lab Results  Component Value Date   NA 138 02/09/2019   K 4.0 02/09/2019   CL 98 02/09/2019   CO2 26 02/09/2019   GLUCOSE 103 (H) 02/09/2019   BUN 13 02/09/2019   CREATININE 0.57 02/09/2019   CALCIUM 9.6 02/09/2019   Lab Results  Component Value Date   INR 1.02 10/24/2015   No results found for: CHOL, HDL, LDLCALC, TRIG   GEN- The patient is well appearing, alert and oriented x 3 today.   Head- normocephalic, atraumatic Eyes-  Sclera clear, conjunctiva pink Ears- hearing intact Oropharynx- clear Neck- supple, no JVP Lymph- no cervical lymphadenopathy Lungs- Clear to ausculation bilaterally, normal work of breathing Heart- Regular rate and rhythm, no murmurs, rubs or gallops, PMI not laterally displaced GI- soft, NT, ND, + BS Extremities- no clubbing, cyanosis, or edema MS- no significant deformity or atrophy Skin- no rash or lesion Psych- euthymic mood, full affect Neuro- strength and sensation are intact  EKG-Sinus brady at 52 bpm, pr int 140 ms, qrs int 144 ms, qtc 431 ms    Assessment and Plan: 1. Persistent afib  New since dx in October 2020 Now in [place] with h/o successful cardioversion 02/09/19 General education re afib Will decrease some of her caffeine intake Will order sleep study with witnessed snoring and apnea  She is not  on daily  rate control as she has bradycardia in SR   2. HTN Stable   3. Bradycardia Asymptomatic   4. CHA2DS2VASc score of at least 4 Continue eliquis 5 mg bid   Sheila Gardner. Sheila Gardner, [facility] [address] [place], [place] [address] [phone]

## 2019-03-08 NOTE — Telephone Encounter (Signed)
  Lauralee Evener, CMA  Freada Bergeron, CMA  Ok to schedule sleep study. Patient has medicare and does not need a PA.

## 2019-03-08 NOTE — Telephone Encounter (Addendum)
Patient is scheduled for lab study on 03/24/19. Patient  is scheduled for COVID screening on 03/21/19 1:30. Patient understands her sleep study will be done at [facility] sleep lab. Patient understands she will receive a sleep packet in a week or so. Patient understands to call if she does not receive the sleep packet in a timely manner. Patient agrees with treatment and thanked me for call.

## 2019-03-13 ENCOUNTER — Other Ambulatory Visit: Payer: Self-pay | Admitting: Family Medicine

## 2019-03-13 DIAGNOSIS — Z1231 Encounter for screening mammogram for malignant neoplasm of breast: Secondary | ICD-10-CM

## 2019-03-14 NOTE — Telephone Encounter (Signed)
Patient called to cancel her sleep study she states she can not do it at this time. She did not reschedule with me.

## 2019-03-15 NOTE — Progress Notes (Signed)
Virtual Visit via Video Note   This visit type was conducted due to national recommendations for restrictions regarding the COVID-19 Pandemic (e.g. social distancing) in an effort to limit this patient's exposure and mitigate transmission in our community.  Due to her co-morbid illnesses, this patient is at least at moderate risk for complications without adequate follow up.  This format is felt to be most appropriate for this patient at this time.  All issues noted in this document were discussed and addressed.  A limited physical exam was performed with this format.  Please refer to the patient's chart for her consent to telehealth for [facility].   Date:  03/15/2019   ID:  Sheila Gardner, DOB [birth year], MRN [id number]  Patient Location: Home Provider Location: Office  PCP:  Lawerance Cruel, MD  Cardiologist:  Sinclair Grooms, MD  Electrophysiologist:  None   Evaluation Performed:  Follow-Up Visit  Chief Complaint:  PAF  History of Present Illness:    Sheila Gardner is a 84 y.o. female with weakness, nausea, bradycardia, right bundle branch block on EKG, chronic anticoagulation, and AF.Marland Kitchen  Recent atrial fibrillation with successful electrical cardioversion on December 14th 2020.  Since that time she has felt well. She has chronic h/o CP and will be evaluated with Cor CTA.  She has experienced some sudden onset fatigue and lightheadedness/near syncope.  She does note heart rates less than 50 at times that causes her to sit down.  She has not had syncope.  She feels she is back in sinus rhythm.  She has not had bleeding on Eliquis.  Eliquis is a financial challenge for her.  The patient does not have symptoms concerning for COVID-19 infection (fever, chills, cough, or new shortness of breath).    Past Medical History:  Diagnosis Date  . Arthritis   . Breast cancer ([place])   . Cancer ([place])    Breast- rt  . Cough with sputum 2016  . Elevated liver function tests 05/09/2013  .  GERD (gastroesophageal reflux disease)    COSTOCHONDRITIS  . Heart murmur   . Hip pain 05/08/2014  . Hyperlipidemia   . Hypertension   . Lipoma    back of neck  . PONV (postoperative nausea and vomiting)    only after rt hip surgery  . Primary osteoarthritis of left knee 11/04/2015  . Primary osteoarthritis of right hip 07/30/2014   Past Surgical History:  Procedure Laterality Date  . ANKLE FRACTURE SURGERY Right 2013   RIGHT   . BREAST LUMPECTOMY Right 2011  . BREAST SURGERY  04/22/2009   Rt lumpectomy  . CARDIOVERSION N/A 02/19/2019   Procedure: CARDIOVERSION;  Surgeon: Dorothy Spark, MD;  [facility] ENDOSCOPY;  Service: Cardiovascular;  Laterality: N/A;  . EYE SURGERY  2001   macular hole  . JOINT REPLACEMENT  2016  . MIDDLE EAR SURGERY Left 08/18/2015   repair eardrum and reconstruction  . OVARY SURGERY  40 years ago   wedge  . TOTAL HIP ARTHROPLASTY Right 07/30/2014   Procedure: TOTAL HIP ARTHROPLASTY ANTERIOR APPROACH;  Surgeon: Melrose Nakayama, MD;  Location: [employer];  Service: Orthopedics;  Laterality: Right;  . TOTAL KNEE ARTHROPLASTY Left 11/04/2015  . TOTAL KNEE ARTHROPLASTY Left 11/04/2015   Procedure: TOTAL KNEE ARTHROPLASTY;  Surgeon: Melrose Nakayama, MD;  Location: [place];  Service: Orthopedics;  Laterality: Left;  . TYMPANOPLASTY  30 years ago     No outpatient medications have been marked as taking  for the 03/16/19 encounter (Appointment) with Belva Crome, MD.     Allergies:   Patient has no known allergies.   Social History   Tobacco Use  . Smoking status: Never Smoker  . Smokeless tobacco: Never Used  Substance Use Topics  . Alcohol use: Not Currently    Comment: OCC -none now  . Drug use: No     Family Hx: The patient's family history includes Cancer in her brother; Heart disease in her mother.  ROS:   Please see the history of present illness.    Lightheadedness, dizziness, but otherwise no complaints. All other systems reviewed and are  negative.   Prior CV studies:   The following studies were reviewed today:  Recent monitor demonstrated continuous atrial for ablation with heart rates ranging between 50 and 125 bpm Long-term monitor 02/09/2019: Study Highlights   Atrial fibrillation/flutter with controlled ventricular response.  Frequent pauses of less than 3.5 seconds during sleep  Peak heart rate 127 bpm     Labs/Other Tests and Data Reviewed:    EKG:  No ECG reviewed.  Recent Labs: 10/05/2018: ALT 17 02/09/2019: BUN 13; Creatinine, Ser 0.57; Hemoglobin 12.5; Platelets 270; Potassium 4.0; Sodium 138   Recent Lipid Panel No results found for: CHOL, TRIG, HDL, CHOLHDL, LDLCALC, LDLDIRECT  Wt Readings from Last 3 Encounters:  03/05/19 183 lb (83 kg)  02/19/19 177 lb (80.3 kg)  02/09/19 191 lb 9.6 oz (86.9 kg)     Objective:    Vital Signs:  There were no vitals taken for this visit.   VITAL SIGNS:  reviewed GEN:  no acute distress RESPIRATORY:  normal respiratory effort, symmetric expansion CARDIOVASCULAR:  no peripheral edema  ASSESSMENT & PLAN:    1. Paroxysmal atrial fibrillation (HCC)   2. Right bundle branch block   3. Essential hypertension   4. Coronary artery calcification   5. Type 2 diabetes mellitus with complication, without long-term current use of insulin ([place])   6. Educated about COVID-19 virus infection    PLAN:  1. 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  She was successfully cardioverted but now complains that she has had several episodes with low heart rates less than 50 they cause her to feel weak.  We will plan a 7-day monitor to see if there is evidence of severe bradycardia that will warrant pacemaker therapy. 2. Not discussed or assessed 3. Adequate control 4. She has been having chest pain.  She has coronary calcification and is diabetic.  Coronary CTA with FFR will be done to exclude high risk subset. 5. Hemoglobin A1c less than 7 is target 6. 3W's  to avoid COVID-19 infection and also the patient was encouraged to take the COVID-19 vaccine as soon as possible.  Clinic follow-up in 3 months.  COVID-19 Education: The signs and symptoms of COVID-19 were discussed with the patient and how to seek care for testing (follow up with PCP or arrange E-visit).  The importance of social distancing was discussed today.  Time:   Today, I have spent 15 minutes with the patient with telehealth technology discussing the above problems.     Medication Adjustments/Labs and Tests Ordered: Current medicines are reviewed at length with the patient today.  Concerns regarding medicines are outlined above.   Tests Ordered: No orders of the defined types were placed in this encounter.   Medication Changes: No orders of the defined types were placed in this encounter.   Follow Up:  In Person in 3  month(s)  Signed, Sinclair Grooms, MD  03/15/2019 5:35 PM    [facility]

## 2019-03-16 ENCOUNTER — Other Ambulatory Visit: Payer: Self-pay | Admitting: Interventional Cardiology

## 2019-03-16 ENCOUNTER — Encounter: Payer: Self-pay | Admitting: Interventional Cardiology

## 2019-03-16 ENCOUNTER — Telehealth (INDEPENDENT_AMBULATORY_CARE_PROVIDER_SITE_OTHER): Payer: Medicare Other | Admitting: Interventional Cardiology

## 2019-03-16 ENCOUNTER — Other Ambulatory Visit: Payer: Self-pay

## 2019-03-16 ENCOUNTER — Telehealth: Payer: Self-pay | Admitting: Radiology

## 2019-03-16 ENCOUNTER — Encounter: Payer: Self-pay | Admitting: *Deleted

## 2019-03-16 VITALS — BP 120/81 | HR 54 | Ht 64.5 in | Wt 176.0 lb

## 2019-03-16 DIAGNOSIS — I10 Essential (primary) hypertension: Secondary | ICD-10-CM

## 2019-03-16 DIAGNOSIS — I480 Paroxysmal atrial fibrillation: Secondary | ICD-10-CM

## 2019-03-16 DIAGNOSIS — I4510 Unspecified right bundle-branch block: Secondary | ICD-10-CM

## 2019-03-16 DIAGNOSIS — I2510 Atherosclerotic heart disease of native coronary artery without angina pectoris: Secondary | ICD-10-CM

## 2019-03-16 DIAGNOSIS — R072 Precordial pain: Secondary | ICD-10-CM

## 2019-03-16 DIAGNOSIS — E118 Type 2 diabetes mellitus with unspecified complications: Secondary | ICD-10-CM

## 2019-03-16 DIAGNOSIS — R001 Bradycardia, unspecified: Secondary | ICD-10-CM

## 2019-03-16 DIAGNOSIS — Z7189 Other specified counseling: Secondary | ICD-10-CM

## 2019-03-16 DIAGNOSIS — I2584 Coronary atherosclerosis due to calcified coronary lesion: Secondary | ICD-10-CM

## 2019-03-16 MED ORDER — METOPROLOL TARTRATE 50 MG PO TABS: ORAL_TABLET | ORAL | 0 refills | Status: DC

## 2019-03-16 NOTE — Patient Instructions (Addendum)
Medication Instructions:  Your physician recommends that you continue on your current medications as directed. Please refer to the Current Medication list given to you today.  *If you need a refill on your cardiac medications before your next appointment, please call your pharmacy*  Lab Work: You will need a BMET prior to your CT  If you have labs (blood work) drawn today and your tests are completely normal, you will receive your results only by: Marland Kitchen MyChart Message (if you have MyChart) OR . A paper copy in the mail If you have any lab test that is abnormal or we need to change your treatment, we will call you to review the results.  Testing/Procedures: Your physician recommends that you wear a monitor for 3-7 days.  Your physician recommends that you have a Coronary CT performed.  Follow-Up: At [facility], you and your health needs are our priority.  As part of our continuing mission to provide you with exceptional heart care, we have created designated Provider Care Teams.  These Care Teams include your primary Cardiologist (physician) and Advanced Practice Providers (APPs -  Physician Assistants and Nurse Practitioners) who all work together to provide you with the care you need, when you need it.  Your next appointment:   3 month(s)  The format for your next appointment:   In Person  Provider:   You may see Sinclair Grooms, MD or one of the following Advanced Practice Providers on your designated Care Team:    Truitt Merle, NP  Cecilie Kicks, NP  Kathyrn Drown, NP   Other Instructions  Your cardiac CT will be scheduled at one of the below locations:   [facility] [address] [place], [place] [address] [phone]  [place] [address] [place], [place] [address] [phone]  If scheduled at [facility], please arrive at the [facility] main entrance of [facility]  30-45 minutes prior to test start time. Proceed to the [facility] Radiology Department (first floor) to check-in and test prep.  If scheduled at [facility], please arrive 15 mins early for check-in and test prep.  Please follow these instructions carefully (unless otherwise directed):  Hold all erectile dysfunction medications at least 3 days (72 hrs) prior to test.  On the Night Before the Test: . Be sure to Drink plenty of water. . Do not consume any caffeinated/decaffeinated beverages or chocolate 12 hours prior to your test. . Do not take any antihistamines 12 hours prior to your test.  On the Day of the Test: . Drink plenty of water. Do not drink any water within one hour of the test. . Do not eat any food 4 hours prior to the test. . You may take your regular medications prior to the test.  . Take metoprolol (Lopressor) two hours prior to test. . HOLD Furosemide/Hydrochlorothiazide morning of the test. . FEMALES- please wear underwire-free bra if available       After the Test: . Drink plenty of water. . After receiving IV contrast, you may experience a mild flushed feeling. This is normal. . On occasion, you may experience a mild rash up to 24 hours after the test. This is not dangerous. If this occurs, you can take Benadryl 25 mg and increase your fluid intake. . If you experience trouble breathing, this can be serious. If it is severe call 911 IMMEDIATELY. If it is mild, please  call our office. . If you take any of these medications: Glipizide/Metformin, Avandament, Glucavance, please do not take 48 hours after completing test unless otherwise instructed.   Once we have confirmed authorization from your insurance company, we will call you to set up a date and time for your test.   For non-scheduling related questions, please contact the cardiac imaging nurse navigator should you have any questions/concerns: Marchia Bond, RN Navigator Cardiac  Imaging [facility] [phone] Office

## 2019-03-16 NOTE — Telephone Encounter (Signed)
New message:    Patient forgot to tell them that her pharmacy has changed to [facility]. Please send refill to [facility]

## 2019-03-16 NOTE — Telephone Encounter (Signed)
Enrolled patient for a 7 day Zio Monitor to be mailed to patients home.  

## 2019-03-21 ENCOUNTER — Other Ambulatory Visit (INDEPENDENT_AMBULATORY_CARE_PROVIDER_SITE_OTHER): Payer: Medicare Other

## 2019-03-21 ENCOUNTER — Other Ambulatory Visit (HOSPITAL_COMMUNITY): Payer: Medicare Other

## 2019-03-21 DIAGNOSIS — R001 Bradycardia, unspecified: Secondary | ICD-10-CM

## 2019-03-21 DIAGNOSIS — I480 Paroxysmal atrial fibrillation: Secondary | ICD-10-CM

## 2019-03-23 DIAGNOSIS — M1711 Unilateral primary osteoarthritis, right knee: Secondary | ICD-10-CM | POA: Diagnosis not present

## 2019-03-23 DIAGNOSIS — Z96652 Presence of left artificial knee joint: Secondary | ICD-10-CM | POA: Insufficient documentation

## 2019-03-23 DIAGNOSIS — M25562 Pain in left knee: Secondary | ICD-10-CM | POA: Diagnosis not present

## 2019-03-23 DIAGNOSIS — M25552 Pain in left hip: Secondary | ICD-10-CM | POA: Diagnosis not present

## 2019-03-24 ENCOUNTER — Encounter (HOSPITAL_BASED_OUTPATIENT_CLINIC_OR_DEPARTMENT_OTHER): Payer: Medicare Other | Admitting: Cardiology

## 2019-03-25 ENCOUNTER — Other Ambulatory Visit: Payer: Self-pay

## 2019-03-25 ENCOUNTER — Emergency Department (HOSPITAL_COMMUNITY)
Admission: EM | Admit: 2019-03-25 | Discharge: 2019-03-25 | Disposition: A | Discharge status: Home / Self Care | Payer: Medicare Other | Attending: Emergency Medicine | Admitting: Emergency Medicine

## 2019-03-25 ENCOUNTER — Encounter (HOSPITAL_COMMUNITY): Payer: Self-pay | Admitting: Emergency Medicine

## 2019-03-25 ENCOUNTER — Emergency Department (HOSPITAL_COMMUNITY): Payer: Medicare Other

## 2019-03-25 DIAGNOSIS — R001 Bradycardia, unspecified: Secondary | ICD-10-CM | POA: Diagnosis not present

## 2019-03-25 DIAGNOSIS — Z96651 Presence of right artificial knee joint: Secondary | ICD-10-CM | POA: Diagnosis not present

## 2019-03-25 DIAGNOSIS — Z853 Personal history of malignant neoplasm of breast: Secondary | ICD-10-CM | POA: Diagnosis not present

## 2019-03-25 DIAGNOSIS — Z79899 Other long term (current) drug therapy: Secondary | ICD-10-CM | POA: Insufficient documentation

## 2019-03-25 DIAGNOSIS — R42 Dizziness and giddiness: Secondary | ICD-10-CM | POA: Insufficient documentation

## 2019-03-25 DIAGNOSIS — Z96652 Presence of left artificial knee joint: Secondary | ICD-10-CM | POA: Diagnosis not present

## 2019-03-25 DIAGNOSIS — I10 Essential (primary) hypertension: Secondary | ICD-10-CM | POA: Diagnosis not present

## 2019-03-25 DIAGNOSIS — Z7901 Long term (current) use of anticoagulants: Secondary | ICD-10-CM | POA: Diagnosis not present

## 2019-03-25 DIAGNOSIS — I4510 Unspecified right bundle-branch block: Secondary | ICD-10-CM | POA: Diagnosis not present

## 2019-03-25 LAB — CBC WITH DIFFERENTIAL/PLATELET
Abs Immature Granulocytes: 0.02 10*3/uL (ref 0.00–0.07)
Basophils Absolute: 0 10*3/uL (ref 0.0–0.1)
Basophils Relative: 1 %
Eosinophils Absolute: 0.1 10*3/uL (ref 0.0–0.5)
Eosinophils Relative: 1 %
HCT: 37.3 % (ref 36.0–46.0)
Hemoglobin: 12.3 g/dL (ref 12.0–15.0)
Immature Granulocytes: 0 %
Lymphocytes Relative: 34 %
Lymphs Abs: 2.2 10*3/uL (ref 0.7–4.0)
MCH: 30.5 pg (ref 26.0–34.0)
MCHC: 33 g/dL (ref 30.0–36.0)
MCV: 92.6 fL (ref 80.0–100.0)
Monocytes Absolute: 0.7 10*3/uL (ref 0.1–1.0)
Monocytes Relative: 11 %
Neutro Abs: 3.6 10*3/uL (ref 1.7–7.7)
Neutrophils Relative %: 53 %
Platelets: 262 10*3/uL (ref 150–400)
RBC: 4.03 MIL/uL (ref 3.87–5.11)
RDW: 12.5 % (ref 11.5–15.5)
WBC: 6.7 10*3/uL (ref 4.0–10.5)
nRBC: 0 % (ref 0.0–0.2)

## 2019-03-25 LAB — COMPREHENSIVE METABOLIC PANEL
ALT: 19 U/L (ref 0–44)
AST: 20 U/L (ref 15–41)
Albumin: 3.5 g/dL (ref 3.5–5.0)
Alkaline Phosphatase: 47 U/L (ref 38–126)
Anion gap: 11 (ref 5–15)
BUN: 7 mg/dL — ABNORMAL LOW (ref 8–23)
CO2: 27 mmol/L (ref 22–32)
Calcium: 9.3 mg/dL (ref 8.9–10.3)
Chloride: 97 mmol/L — ABNORMAL LOW (ref 98–111)
Creatinine, Ser: 0.48 mg/dL (ref 0.44–1.00)
GFR calc Af Amer: >60 mL/min (ref >60)
GFR calc non Af Amer: >60 mL/min (ref >60)
Glucose, Bld: 113 mg/dL — ABNORMAL HIGH (ref 70–99)
Potassium: 3.7 mmol/L (ref 3.5–5.1)
Sodium: 135 mmol/L (ref 135–145)
Total Bilirubin: 0.9 mg/dL (ref 0.3–1.2)
Total Protein: 6.4 g/dL — ABNORMAL LOW (ref 6.5–8.1)

## 2019-03-25 LAB — URINALYSIS, ROUTINE W REFLEX MICROSCOPIC
Bilirubin Urine: NEGATIVE
Glucose, UA: NEGATIVE mg/dL
Hgb urine dipstick: NEGATIVE
Ketones, ur: NEGATIVE mg/dL
Leukocytes,Ua: NEGATIVE
Nitrite: NEGATIVE
Protein, ur: NEGATIVE mg/dL
Specific Gravity, Urine: 1.005 (ref 1.005–1.030)
pH: 8 (ref 5.0–8.0)

## 2019-03-25 LAB — TROPONIN I (HIGH SENSITIVITY)
Troponin I (High Sensitivity): 6 ng/L (ref <18)
Troponin I (High Sensitivity): 9 ng/L (ref <18)

## 2019-03-25 NOTE — ED Notes (Signed)
PT ambulated well. Denies any dizziness.

## 2019-03-25 NOTE — ED Triage Notes (Signed)
Pt presents to ED from home BIB [facility]. Pt c/o dizziness beginning at 0100. Per EMS pt is bradycardic at lowest 38. Pt in SB w/ RBBB. Pt wearing heart monitor for a fib. Pt has been experiencing dizzy/weakness spells since 09/2018. This is her second time being on heart monitor for it.

## 2019-03-25 NOTE — ED Provider Notes (Signed)
[facility] EMERGENCY DEPARTMENT Provider Note   CSN: [id number] Arrival date & time: 03/25/19  0601     History Chief Complaint  Patient presents with   Dizziness    Sheila Gardner is a 84 y.o. female.  HPI Patient presents to the emergency department with dizziness that started around 1 AM.  The patient states she went up to use the bathroom and started feeling some dizziness and then sat down and continued to feel this.  Patient states that she got up to go to the bathroom again and felt like the room was spinning around her.  The patient states that nothing seems to make the condition better or worse.  The patient denies chest pain, shortness of breath, headache,blurred vision, neck pain, fever, cough, weakness, numbness,  anorexia, edema, abdominal pain, nausea, vomiting, diarrhea, rash, back pain, dysuria, hematemesis, bloody stool, near syncope, or syncope.    Past Medical History:  Diagnosis Date   Arthritis    Breast cancer ([place])    Cancer ([place])    Breast- rt   Cough with sputum 2016   Elevated liver function tests 05/09/2013   GERD (gastroesophageal reflux disease)    COSTOCHONDRITIS   Heart murmur    Hip pain 05/08/2014   Hyperlipidemia    Hypertension    Lipoma    back of neck   PONV (postoperative nausea and vomiting)    only after rt hip surgery   Primary osteoarthritis of left knee 11/04/2015   Primary osteoarthritis of right hip 07/30/2014    Patient Active Problem List   Diagnosis Date Noted   Primary osteoarthritis of left knee 11/04/2015   Primary osteoarthritis of right hip 07/30/2014   Hip pain 05/08/2014   Elevated liver function tests 05/09/2013   Vitamin D deficiency 03/25/2011   Malignant neoplasm of upper-outer quadrant of right breast in female, estrogen receptor positive ([place]) 12/30/2010    Past Surgical History:  Procedure Laterality Date   ANKLE FRACTURE SURGERY Right 2013   RIGHT    BREAST  LUMPECTOMY Right 2011   BREAST SURGERY  04/22/2009   Rt lumpectomy   CARDIOVERSION N/A 02/19/2019   Procedure: CARDIOVERSION;  Surgeon: Dorothy Spark, MD;  Location: [facility] ENDOSCOPY;  Service: Cardiovascular;  Laterality: N/A;   EYE SURGERY  2001   macular hole   JOINT REPLACEMENT  2016   MIDDLE EAR SURGERY Left 08/18/2015   repair eardrum and reconstruction   OVARY SURGERY  40 years ago   wedge   TOTAL HIP ARTHROPLASTY Right 07/30/2014   Procedure: TOTAL HIP ARTHROPLASTY ANTERIOR APPROACH;  Surgeon: Melrose Nakayama, MD;  Location: [place];  Service: Orthopedics;  Laterality: Right;   TOTAL KNEE ARTHROPLASTY Left 11/04/2015   TOTAL KNEE ARTHROPLASTY Left 11/04/2015   Procedure: TOTAL KNEE ARTHROPLASTY;  Surgeon: Melrose Nakayama, MD;  Location: [place];  Service: Orthopedics;  Laterality: Left;   TYMPANOPLASTY  30 years ago     OB History   No obstetric history on file.     Family History  Problem Relation Age of Onset   Heart disease Mother    Cancer Brother        colon, pancreatic, brain    Social History   Tobacco Use   Smoking status: Never Smoker   Smokeless tobacco: Never Used  Substance Use Topics   Alcohol use: Not Currently    Comment: OCC -none now   Drug use: No    Home Medications Prior to Admission medications  Medication Sig Start Date End Date Taking? Authorizing Provider  acetaminophen (TYLENOL) 500 MG tablet Take 500-1,000 mg by mouth every 6 (six) hours as needed for mild pain.     [provider]  acetaminophen (TYLENOL) 650 MG CR tablet Take 1,300 mg by mouth every 8 (eight) hours as needed for pain.    [provider]  apixaban (ELIQUIS) 5 MG TABS tablet Take 1 tablet (5 mg total) by mouth 2 (two) times daily. 12/19/18   Belva Crome, MD  Ascorbic Acid (VITAMIN C) 1000 MG tablet Take 1,000 mg by mouth daily.     [provider]  BIOTIN PO Take 10,000 Units by mouth daily with breakfast.     [provider]  cetirizine (ZYRTEC) 10 MG tablet Take 10 mg by mouth daily as needed for allergies.    [provider]  Cholecalciferol (VITAMIN D-3) 1000 UNITS CAPS Take 1,000 Units by mouth daily with breakfast.     [provider]  cyanocobalamin 500 MCG tablet Take 1,000 mcg by mouth daily.     [provider]  esomeprazole (NEXIUM) 20 MG capsule Take 20 mg by mouth daily at 12 noon.    [provider]  ferrous sulfate 325 (65 FE) MG tablet Take 325 mg by mouth daily with breakfast.    [provider]  hydrochlorothiazide (HYDRODIURIL) 25 MG tablet Take 25 mg by mouth as needed (fluid).    [provider]  LORazepam (ATIVAN) 1 MG tablet Take 1 mg by mouth 2 (two) times daily as needed for anxiety or sleep. Take every night at bedtime    [provider]  losartan (COZAAR) 100 MG tablet Take 100 mg by mouth daily.     [provider]  magnesium oxide (MAG-OX) 400 MG tablet Take 400 mg by mouth daily. Triple    [provider]  Melatonin 10 MG TABS Take 10 mg by mouth at bedtime.    [provider]  metoprolol tartrate (LOPRESSOR) 50 MG tablet Take one tablet by mouth 2 hours prior to your CT 03/16/19   Belva Crome, MD  OVER THE COUNTER MEDICATION Take 1 tablet by mouth daily at 2 PM. healthy brain all day focus    [provider]  OVER THE COUNTER MEDICATION 300 mg daily as needed (arthritis). Hemp oil    [provider]  oxybutynin (DITROPAN) 5 MG tablet Take 5 mg by mouth as needed for bladder spasms.  12/03/18   [provider]  potassium chloride (K-DUR) 10 MEQ tablet Take 20 mEq by mouth daily.     [provider]  pravastatin (PRAVACHOL) 20 MG tablet Take 20 mg by mouth daily. 02/08/19   [provider]  pyridOXINE (VITAMIN B-6) 100 MG tablet Take 100 mg by mouth daily.    [provider]  vitamin E 400 UNIT capsule Take 400 Units by mouth daily.     [provider]    Allergies    Patient has no known allergies.  Review of Systems   Review of Systems All other systems negative except as documented in the HPI. All pertinent positives and negatives as reviewed in the HPI. Physical Exam Updated Vital Signs BP (!) 176/72    Pulse (!) 51    Resp 19    Ht 5\' 4"  (1.626 m)    Wt 81.6 kg    SpO2 100%    BMI 30.90 kg/m   Physical Exam Vitals  and nursing note reviewed.  Constitutional:      General: She is not in acute distress.    Appearance: She is well-developed.  HENT:     Head: Normocephalic and atraumatic.  Eyes:     Pupils: Pupils are equal, round, and reactive to light.  Cardiovascular:     Rate and Rhythm: Normal rate and regular rhythm.     Heart sounds: Normal heart sounds. No murmur. No friction rub. No gallop.   Pulmonary:     Effort: Pulmonary effort is normal. No respiratory distress.     Breath sounds: Normal breath sounds. No wheezing.  Abdominal:     General: Bowel sounds are normal. There is no distension.     Palpations: Abdomen is soft.     Tenderness: There is no abdominal tenderness.  Musculoskeletal:     Cervical back: Normal range of motion and neck supple.  Skin:    General: Skin is warm and dry.     Capillary Refill: Capillary refill takes less than 2 seconds.     Findings: No erythema or rash.  Neurological:     Mental Status: She is alert and oriented to person, place, and time.     GCS: GCS eye subscore is 4. GCS verbal subscore is 5. GCS motor subscore is 6.     Sensory: No sensory deficit.     Motor: No weakness or abnormal muscle tone.     Coordination: Heel to Shin Test normal.  Psychiatric:        Behavior: Behavior normal.     ED Results / Procedures / Treatments   Labs (all labs ordered are listed, but only abnormal results are displayed) Labs Reviewed  COMPREHENSIVE METABOLIC PANEL  CBC WITH DIFFERENTIAL/PLATELET  URINALYSIS, ROUTINE W REFLEX MICROSCOPIC  TROPONIN I  (HIGH SENSITIVITY)    EKG EKG Interpretation  Date/Time:  Sunday March 25 2019 06:11:21 EST Ventricular Rate:  42 PR Interval:    QRS Duration: 158 QT Interval:  510 QTC Calculation: 427 R Axis:   52 Text Interpretation: Sinus bradycardia Right bundle branch block Baseline wander in lead(s) V5 Since last tracing rate slower Confirmed by Dorie Rank [phone]) on 03/25/2019 7:01:18 AM   Radiology No results found.  Procedures Procedures (including critical care time)  Medications Ordered in ED Medications - No data to display  ED Course  I have reviewed the triage vital signs and the nursing notes.  Pertinent labs & imaging results that were available during my care of the patient were reviewed by me and considered in my medical decision making (see chart for details).    MDM Rules/Calculators/A&P                      Has a new symptom from the best she can tell with this room spinning sensation.  She has had some dizziness and lightheadedness along with near syncope due to her bradycardia they were concerned with.  The patient may need further evaluation with MRI due to these newer type symptoms with this bradycardia.  This demonstrates she has not had a posterior circulation stroke. Spoke with Cardiology and they want would like for her to follow-up closely with her doctor.  The patient ambulated about the department without any difficulties or symptoms at this time.  The patient definitely is having symptomatic bradycardia but this will need further work-up by her doctor.    Final Clinical Impression(s) / ED Diagnoses Final diagnoses:  None  Rx / DC Orders ED Discharge Orders    None       Dalia Heading, Hershal Coria 03/25/19 1403    Dorie Rank, MD 03/26/19 [phone]

## 2019-03-25 NOTE — ED Notes (Signed)
Patient transported to MRI 

## 2019-03-25 NOTE — ED Notes (Signed)
[employer] monitor company called and stated that there is no way to interrogate pts device unless it is sent back to company

## 2019-03-25 NOTE — ED Notes (Signed)
Pt verbalized understanding of discharge paperwork and follow-up care.  °

## 2019-03-25 NOTE — Discharge Instructions (Addendum)
Return here as needed for any worsening in your condition.  Follow-up with your cardiologist as soon as possible for further evaluation of your symptoms.

## 2019-03-26 ENCOUNTER — Telehealth: Payer: Self-pay | Admitting: Interventional Cardiology

## 2019-03-26 NOTE — Telephone Encounter (Signed)
I just scheduled the patient for her Cardiac ct for 04/23/19 - she still would like to speak to someone regarding her heart rate being low.

## 2019-03-26 NOTE — Telephone Encounter (Signed)
New Message  STAT if HR is under 50 or over 120 (normal HR is 60-100 beats per minute)  1) What is your heart rate? 57  2) Do you have a log of your heart rate readings (document readings)? 48, 38, 44, 57,   3) Do you have any other symptoms? Patient was in the hospital for this yesterday. Patient had dizziness, increased bp, decreased hr.

## 2019-03-26 NOTE — Telephone Encounter (Signed)
Attempted to call patient back - no answer/VM full

## 2019-03-27 NOTE — Telephone Encounter (Signed)
Pt went to ER for dizziness and bradycardia on 1/17.  She woke up at 1AM and felt dizzy.  Laid back down and was able to fall asleep.  Woke again at 4AM to use the restroom and states dizziness was worse.  She felt like the room was spinning and she was going to fall down.  Laid back down but felt like it was getting worse so she called her son who came over and decided it was good to call 911.  Pt states BP on home cuff prior to EMS arriving was 200s/?? But she felt this was inaccurate since when EMS arrived it was normal.  BP elevated at ER.  States HR fluctuated from 36-51 at hospital. Has felt fine since leaving ER.  States yesterday she felt great.  Vitals yesterday were 103/63, HR 61.  Pt checked vitals while on the phone and they were 153/83, HR50 but pt has not taken meds yet today.  Pt recently wore a monitor for 4 days.  Had to go ahead and mail it back due to having to remove for her MRI.  Advised pt I will send message to Dr. [name] for review and will call back if any recommendations but we may have to wait for monitor.

## 2019-03-27 NOTE — Telephone Encounter (Signed)
On no meds that slow the HR. Will see what monitor shows. Spinning feeling sounds more like vertigo.

## 2019-03-27 NOTE — Telephone Encounter (Signed)
Spoke with pt and made her aware of what Dr. Smith said.  Pt appreciative for call.  °

## 2019-04-04 ENCOUNTER — Telehealth: Payer: Self-pay | Admitting: Interventional Cardiology

## 2019-04-04 NOTE — Telephone Encounter (Signed)
Spoke with pt and made her aware that monitor has not been downloaded as of yet.  Advised I will call with results as soon as they are available to me.  Pt thanked me for call.

## 2019-04-04 NOTE — Telephone Encounter (Signed)
Patient following up on monitor results.

## 2019-04-05 DIAGNOSIS — R001 Bradycardia, unspecified: Secondary | ICD-10-CM | POA: Diagnosis not present

## 2019-04-05 DIAGNOSIS — I480 Paroxysmal atrial fibrillation: Secondary | ICD-10-CM | POA: Diagnosis not present

## 2019-04-11 DIAGNOSIS — E782 Mixed hyperlipidemia: Secondary | ICD-10-CM | POA: Diagnosis not present

## 2019-04-20 ENCOUNTER — Encounter (HOSPITAL_COMMUNITY): Payer: Self-pay

## 2019-04-20 ENCOUNTER — Telehealth (HOSPITAL_COMMUNITY): Payer: Self-pay | Admitting: Emergency Medicine

## 2019-04-20 ENCOUNTER — Telehealth: Payer: Self-pay | Admitting: Interventional Cardiology

## 2019-04-20 MED ORDER — METOPROLOL TARTRATE 50 MG PO TABS: ORAL_TABLET | ORAL | 0 refills | Status: DC

## 2019-04-20 NOTE — Telephone Encounter (Signed)
Based on her age it would be 50 mg if heart rate greater than 55

## 2019-04-20 NOTE — Telephone Encounter (Signed)
Reaching out to patient to offer assistance regarding upcoming cardiac imaging study; pt verbalizes understanding of appt date/time, parking situation and where to check in, pre-test NPO status and medications ordered, and verified current allergies; name and call back number provided for further questions should they arise Marchia Bond RN [place] [phone] office [phone] cell  Pt encouraged to have driver available. Pt denies questions about instructions. Verbalized understanding to check BP/HR 2 hr prior to test in order to know whether she needs the PO metoprolol or not.

## 2019-04-20 NOTE — Telephone Encounter (Signed)
How much Lopressor is prescribed? She should take either 25 or 50 mg.  Should probably not take 100 mg.

## 2019-04-20 NOTE — Telephone Encounter (Signed)
I spoke with patient. She is asking about CT instructions. Instructions say to take lopressor 2 hours prior to scan. She does not have a prescription for this. Monitor recently showed tachy-brady syndrome. Patient reports recent heart rates of 52 and 59.  Will send to Dr [name] to see if patient should take lopressor if heart rate greater than 55 prior to CT.

## 2019-04-20 NOTE — Telephone Encounter (Signed)
New message:     Patient calling and would like for some one to call her concerning her CT appt she has some questions. Please call patient back,

## 2019-04-20 NOTE — Telephone Encounter (Signed)
Reviewed with Dr [name] and [place] for pt to take lopressor 50 mg prior to CT.  I spoke with patient and gave her instructions to take lopressor 2 hours prior to CT. She will hold if her heart rate is less than 55 but will take medicine with her to scan Will send prescription to [facility] on [facility]

## 2019-04-23 ENCOUNTER — Ambulatory Visit (HOSPITAL_COMMUNITY)
Admission: RE | Admit: 2019-04-23 | Discharge: 2019-04-23 | Disposition: A | Discharge status: Home / Self Care | Payer: Medicare Other | Source: Ambulatory Visit | Attending: Interventional Cardiology | Admitting: Interventional Cardiology

## 2019-04-23 ENCOUNTER — Other Ambulatory Visit: Payer: Self-pay

## 2019-04-23 DIAGNOSIS — I700 Atherosclerosis of aorta: Secondary | ICD-10-CM | POA: Insufficient documentation

## 2019-04-23 DIAGNOSIS — I2510 Atherosclerotic heart disease of native coronary artery without angina pectoris: Secondary | ICD-10-CM | POA: Diagnosis not present

## 2019-04-23 DIAGNOSIS — R072 Precordial pain: Secondary | ICD-10-CM | POA: Diagnosis not present

## 2019-04-23 MED ORDER — IOHEXOL 300 MG/ML  SOLN
100.0000 mL | Freq: Once | INTRAMUSCULAR | Status: AC | PRN
  Administered 2019-04-23: 100 mL via INTRAVENOUS

## 2019-04-23 MED ORDER — NITROGLYCERIN 0.4 MG SL SUBL
0.8000 mg | SUBLINGUAL_TABLET | SUBLINGUAL | Status: DC | PRN
  Administered 2019-04-23: 12:00:00 0.8 mg via SUBLINGUAL

## 2019-04-23 MED ORDER — NITROGLYCERIN 0.4 MG SL SUBL
SUBLINGUAL_TABLET | SUBLINGUAL | Status: AC
  Filled 2019-04-23: qty 2

## 2019-04-26 ENCOUNTER — Telehealth: Payer: Self-pay | Admitting: Interventional Cardiology

## 2019-04-26 NOTE — Telephone Encounter (Signed)
Pt called back.  BP now down to 139/75, HR 53.  Pt takes all of her BP meds in AM.  BP has been fluctuating throughout the day.  Highest SBP was 180.  Pt feeling fatigued now but otherwise fine.  Advised keep appt tomorrow and we will discuss further.  Pt appreciative for call.

## 2019-04-26 NOTE — Telephone Encounter (Signed)
New Message    Pt c/o BP issue: STAT if pt c/o blurred vision, one-sided weakness or slurred speech  1. What are your last 5 BP readings? 151/80 hr 51   170/78 hr 48    2. Are you having any other symptoms (ex. Dizziness, headache, blurred vision, passed out)? Pt says she was nauseated this morning and has some blurred vision   3. What is your BP issue? Pt says she took Losartan tablet this morning and she is wondering if she can take another one now to lower her BP

## 2019-04-26 NOTE — Telephone Encounter (Signed)
Left message to call back  

## 2019-04-26 NOTE — Progress Notes (Signed)
Cardiology Office Note:    Date:  04/27/2019   ID:  Sheila Gardner, DOB 1935-09-21, MRN [id number]  PCP:  Lawerance Cruel, MD  Cardiologist:  Sinclair Grooms, MD   Referring MD: Lawerance Cruel, MD   Chief Complaint  Patient presents with  . Coronary Artery Disease    History of Present Illness:    Sheila Gardner is a 84 y.o. female with a hx of weakness, nausea, bradycardia,right bundle branch block on EKG, chronic anticoagulation, PAF, successful electrical cardioversion on February 19, 2019, chronic h/o CP and abnormal coronary CTA.  Occluding multiple complaints, including occasional episodes of extreme fatigue and listlessness.  History of heartburn for which she takes Nexium.  Chest tightness when in atrial fibrillation with rapid response.  No exertional complaints of chest tightness, dyspnea, orthopnea, or PND.  Past Medical History:  Diagnosis Date  . Arthritis   . Breast cancer ([place])   . Cancer ([place])    Breast- rt  . Cough with sputum 2016  . Elevated liver function tests 05/09/2013  . GERD (gastroesophageal reflux disease)    COSTOCHONDRITIS  . Heart murmur   . Hip pain 05/08/2014  . Hyperlipidemia   . Hypertension   . Lipoma    back of neck  . PONV (postoperative nausea and vomiting)    only after rt hip surgery  . Primary osteoarthritis of left knee 11/04/2015  . Primary osteoarthritis of right hip 07/30/2014    Past Surgical History:  Procedure Laterality Date  . ANKLE FRACTURE SURGERY Right 2013   RIGHT   . BREAST LUMPECTOMY Right 2011  . BREAST SURGERY  04/22/2009   Rt lumpectomy  . CARDIOVERSION N/A 02/19/2019   Procedure: CARDIOVERSION;  Surgeon: Dorothy Spark, MD;  Location: [facility] ENDOSCOPY;  Service: Cardiovascular;  Laterality: N/A;  . EYE SURGERY  2001   macular hole  . JOINT REPLACEMENT  2016  . MIDDLE EAR SURGERY Left 08/18/2015   repair eardrum and reconstruction  . OVARY SURGERY  40 years ago   wedge  . TOTAL HIP ARTHROPLASTY  Right 07/30/2014   Procedure: TOTAL HIP ARTHROPLASTY ANTERIOR APPROACH;  Surgeon: Melrose Nakayama, MD;  Location: [place];  Service: Orthopedics;  Laterality: Right;  . TOTAL KNEE ARTHROPLASTY Left 11/04/2015  . TOTAL KNEE ARTHROPLASTY Left 11/04/2015   Procedure: TOTAL KNEE ARTHROPLASTY;  Surgeon: Melrose Nakayama, MD;  Location: [place];  Service: Orthopedics;  Laterality: Left;  . TYMPANOPLASTY  30 years ago    Current Medications: Current Meds  Medication Sig  . acetaminophen (TYLENOL) 500 MG tablet Take 500-1,000 mg by mouth every 6 (six) hours as needed for mild pain.   Marland Kitchen acetaminophen (TYLENOL) 650 MG CR tablet Take 1,300 mg by mouth every 8 (eight) hours as needed for pain.  . Ascorbic Acid (VITAMIN C) 1000 MG tablet Take 1,000 mg by mouth daily.   Marland Kitchen BIOTIN PO Take 10,000 Units by mouth daily with breakfast.   . cetirizine (ZYRTEC) 10 MG tablet Take 10 mg by mouth daily as needed for allergies.  . Cholecalciferol (VITAMIN D-3) 1000 UNITS CAPS Take 1,000 Units by mouth daily with breakfast.   . cyanocobalamin 1000 MCG tablet Take 1,000 mcg by mouth daily.   Marland Kitchen esomeprazole (NEXIUM) 20 MG capsule Take 20 mg by mouth daily at 12 noon.  . ferrous sulfate 325 (65 FE) MG tablet Take 325 mg by mouth daily with breakfast.  . hydrochlorothiazide (HYDRODIURIL) 25 MG tablet Take 25 mg by  mouth as needed (fluid).  . LORazepam (ATIVAN) 1 MG tablet Take 1 mg by mouth at bedtime.   Marland Kitchen losartan (COZAAR) 100 MG tablet Take 100 mg by mouth daily.   . Magnesium ([employer] TRIPLE MAGNESIUM COMPLEX) 400 MG CAPS Take 400 mg by mouth daily.  . metoprolol tartrate (LOPRESSOR) 50 MG tablet Take one tablet by mouth two hours prior to CT scan  . oxybutynin (DITROPAN) 5 MG tablet Take 5 mg by mouth as needed for bladder spasms.   . potassium chloride (K-DUR) 10 MEQ tablet Take 20 mEq by mouth daily.   . pravastatin (PRAVACHOL) 20 MG tablet Take 20 mg by mouth daily.  . prednisoLONE acetate (PRED FORTE) 1 % ophthalmic  suspension Place 1 drop into both eyes daily as needed (Burning).   . pyridOXINE (VITAMIN B-6) 100 MG tablet Take 100 mg by mouth daily.  . vitamin E 400 UNIT capsule Take 400 Units by mouth daily.  . [DISCONTINUED] apixaban (ELIQUIS) 5 MG TABS tablet Take 1 tablet (5 mg total) by mouth 2 (two) times daily.     Allergies:   Patient has no known allergies.   Social History   Socioeconomic History  . Marital status: Widowed    Spouse name: Not on file  . Number of children: Not on file  . Years of education: Not on file  . Highest education level: Not on file  Occupational History  . Not on file  Tobacco Use  . Smoking status: Never Smoker  . Smokeless tobacco: Never Used  Substance and Sexual Activity  . Alcohol use: Not Currently    Comment: OCC -none now  . Drug use: No  . Sexual activity: Not on file  Other Topics Concern  . Not on file  Social History Narrative  . Not on file   Social Determinants of Health   Financial Resource Strain:   . Difficulty of Paying Living Expenses: Not on file  Food Insecurity:   . Worried About Charity fundraiser in the Last Year: Not on file  . Ran Out of Food in the Last Year: Not on file  Transportation Needs:   . Lack of Transportation (Medical): Not on file  . Lack of Transportation (Non-Medical): Not on file  Physical Activity:   . Days of Exercise per Week: Not on file  . Minutes of Exercise per Session: Not on file  Stress:   . Feeling of Stress : Not on file  Social Connections:   . Frequency of Communication with Friends and Family: Not on file  . Frequency of Social Gatherings with Friends and Family: Not on file  . Attends Religious Services: Not on file  . Active Member of Clubs or Organizations: Not on file  . Attends Archivist Meetings: Not on file  . Marital Status: Not on file     Family History: The patient's family history includes Cancer in her brother; Heart disease in her mother.  ROS:    Please see the history of present illness.    She is accompanied by daughter.  She seems to have some issues with her memory.  She is fighting to keep her independence.  She does not sleep well.  Her daughter feels that she does not share conditions/complaints concerning her health because she is frightened her independence will be taken.  All other systems reviewed and are negative.  EKGs/Labs/Other Studies Reviewed:    The following studies were reviewed today:  COR CTA 04/2019:  IMPRESSION: 1. Normal coronary origin with left dominance.  2. At least moderate (50-69%), mixed density plaque in the proximal and mid LAD. Unable to exclude severe stenosis.   FFR 04/2019: 1. Left Main: 0.99; No significant stenosis.  2. Proximal LAD: <0.50; significant stenosis. 3. LCX: 0.92; no significant stenosis. 4. RCA: 0.93; no significant stenosis and nondominant.  IMPRESSION: 1. Proximal LAD is positive by CT-FFR. Would consider cardiac cath.    EKG:  EKG sinus rhythm, right bundle, left atrial abnormality, horizontal axis.  Recent Labs: 03/25/2019: ALT 19; BUN 7; Creatinine, Ser 0.48; Hemoglobin 12.3; Platelets 262; Potassium 3.7; Sodium 135  Recent Lipid Panel No results found for: CHOL, TRIG, HDL, CHOLHDL, VLDL, LDLCALC, LDLDIRECT  Physical Exam:    VS:  BP (!) 162/78   Pulse (!) 52   Ht 5\' 4"  (1.626 m)   Wt 185 lb (83.9 kg)   SpO2 98%   BMI 31.76 kg/m     Wt Readings from Last 3 Encounters:  04/27/19 185 lb (83.9 kg)  03/25/19 180 lb (81.6 kg)  03/16/19 176 lb (79.8 kg)     GEN: Consistent with age.  Mildly overweight. No acute distress HEENT: Normal NECK: No JVD. LYMPHATICS: No lymphadenopathy CARDIAC:  RRR without murmur, gallop, or edema. VASCULAR:  Normal Pulses. No bruits. RESPIRATORY:  Clear to auscultation without rales, wheezing or rhonchi  ABDOMEN: Soft, non-tender, non-distended, No pulsatile mass, MUSCULOSKELETAL: No deformity  SKIN: Warm and  dry NEUROLOGIC:  Alert and oriented x 3 PSYCHIATRIC:  Normal affect   ASSESSMENT:    1. Coronary artery disease of native artery of native heart with stable angina pectoris (HCC)   2. Paroxysmal atrial fibrillation ([place])   3. Right bundle branch block   4. Essential hypertension   5. Type 2 diabetes mellitus with complication, without long-term current use of insulin (HCC)   6. Pure hypercholesterolemia   7. Educated about COVID-19 virus infection    PLAN:    In order of problems listed above:  1. Significant LAD CAD.  Coronary calcium score also very elevated.  FFR 0.6 in LAD territory.  Cath and probable PCI is planned.  We will also have orbital atherectomy rep available since imaging suggests significant calcification in the LAD.  We discussed a wide range of issues concerning PCI in the setting of stable coronary disease.  I am not convinced that she has any symptoms to represent angina other than when her chest was tight during atrial fibrillation with rapid ventricular response.  We discussed the possibility of bleeding on dual antiplatelet therapy and apixaban, possibility of stroke, myocardial infarction, and need for coronary bypass surgery. 2. Still feels she has occasional episodes of A. Fib. 3. Unchanged on today's EKG 4. Mild elevation in systolic blood pressure initially over 0000000 systolic but subsequently when reevaluated was 142/70 mmHg. 5. A1c should be less than 7 6. Target LDL less than 70. 7. COVID-19 vaccine has been given.  3W's is being applied.  Overall education and awareness concerning primary/secondary risk prevention was discussed in detail: LDL less than 70, hemoglobin A1c less than 7, blood pressure target less than 130/80 mmHg, >150 minutes of moderate aerobic activity per week, avoidance of smoking, weight control (via diet and exercise), and continued surveillance/management of/for obstructive sleep apnea.    Medication Adjustments/Labs and Tests  Ordered: Current medicines are reviewed at length with the patient today.  Concerns regarding medicines are outlined above.  Orders Placed This Encounter  Procedures  .  Basic metabolic panel  . CBC  . EKG 12-Lead   No orders of the defined types were placed in this encounter.   Patient Instructions  Medication Instructions:  1) DISCONTINUE Eliquis  *If you need a refill on your cardiac medications before your next appointment, please call your pharmacy*  Lab Work: BMET and CBC today  If you have labs (blood work) drawn today and your tests are completely normal, you will receive your results only by: Marland Kitchen MyChart Message (if you have MyChart) OR . A paper copy in the mail If you have any lab test that is abnormal or we need to change your treatment, we will call you to review the results.  Testing/Procedures: Your physician has requested that you have a cardiac catheterization. Cardiac catheterization is used to diagnose and/or treat various heart conditions. Doctors may recommend this procedure for a number of different reasons. The most common reason is to evaluate chest pain. Chest pain can be a symptom of coronary artery disease (CAD), and cardiac catheterization can show whether plaque is narrowing or blocking your heart's arteries. This procedure is also used to evaluate the valves, as well as measure the blood flow and oxygen levels in different parts of your heart. For further information please visit [email or web address]. Please follow instruction sheet, as given.   Follow-Up: At [facility], you and your health needs are our priority.  As part of our continuing mission to provide you with exceptional heart care, we have created designated Provider Care Teams.  These Care Teams include your primary Cardiologist (physician) and Advanced Practice Providers (APPs -  Physician Assistants and Nurse Practitioners) who all work together to provide you with the care you need, when you  need it.  Your next appointment:   2 week(s)  The format for your next appointment:   In Person  Provider:   You may see Sinclair Grooms, MD or one of the following Advanced Practice Providers on your designated Care Team:    Truitt Merle, NP  Cecilie Kicks, NP  Kathyrn Drown, NP   Other Instructions     [place] OFFICE [place], [place] [address] Dept: [place]: [place]  04/27/2019  You are scheduled for a Cardiac Catheterization on Monday, February 22 with Dr. Daneen Schick.  1. Please arrive at the [facility] (Main Entrance A) at [facility]: [address] [place], [place] [address] at 5:30 AM (This time is two hours before your procedure to ensure your preparation). Free valet parking service is available.   Special note: Every effort is made to have your procedure done on time. Please understand that emergencies sometimes delay scheduled procedures.  2. Diet: Do not eat solid foods after midnight.  The patient may have clear liquids until 5am upon the day of the procedure.  3. Labs: You will have labs drawn today.  4. Medication instructions in preparation for your procedure:   Contrast Allergy: No  You will need to hold your Hydrochlorothiazide and Potassium the morning of the procedure.  On the morning of your procedure, take your Aspirin and any morning medicines NOT listed above.  You may use sips of water.  5. Plan for one night stay--bring personal belongings. 6. Bring a current list of your medications and current insurance cards. 7. You MUST have a responsible person to drive you home. 8. Someone MUST be  with you the first 24 hours after you arrive home or your discharge will be delayed. 9. Please wear clothes that are easy to get on and off and wear slip-on shoes.  Thank you for allowing [place] to care for you!   --  [facility]      Signed, Sinclair Grooms, MD  04/27/2019 5:13 PM    [place]

## 2019-04-26 NOTE — H&P (View-Only) (Signed)
Cardiology Office Note:    Date:  04/27/2019   ID:  Sheila Gardner, DOB 05-01-1935, MRN [id number]  PCP:  Lawerance Cruel, MD  Cardiologist:  Sinclair Grooms, MD   Referring MD: Lawerance Cruel, MD   Chief Complaint  Patient presents with  . Coronary Artery Disease    History of Present Illness:    Sheila Gardner is a 84 y.o. female with a hx of weakness, nausea, bradycardia,right bundle branch block on EKG, chronic anticoagulation, PAF, successful electrical cardioversion on February 19, 2019, chronic h/o CP and abnormal coronary CTA.  Occluding multiple complaints, including occasional episodes of extreme fatigue and listlessness.  History of heartburn for which she takes Nexium.  Chest tightness when in atrial fibrillation with rapid response.  No exertional complaints of chest tightness, dyspnea, orthopnea, or PND.  Past Medical History:  Diagnosis Date  . Arthritis   . Breast cancer ([place])   . Cancer ([place])    Breast- rt  . Cough with sputum 2016  . Elevated liver function tests 05/09/2013  . GERD (gastroesophageal reflux disease)    COSTOCHONDRITIS  . Heart murmur   . Hip pain 05/08/2014  . Hyperlipidemia   . Hypertension   . Lipoma    back of neck  . PONV (postoperative nausea and vomiting)    only after rt hip surgery  . Primary osteoarthritis of left knee 11/04/2015  . Primary osteoarthritis of right hip 07/30/2014    Past Surgical History:  Procedure Laterality Date  . ANKLE FRACTURE SURGERY Right 2013   RIGHT   . BREAST LUMPECTOMY Right 2011  . BREAST SURGERY  04/22/2009   Rt lumpectomy  . CARDIOVERSION N/A 02/19/2019   Procedure: CARDIOVERSION;  Surgeon: Dorothy Spark, MD;  Location: [facility] ENDOSCOPY;  Service: Cardiovascular;  Laterality: N/A;  . EYE SURGERY  2001   macular hole  . JOINT REPLACEMENT  2016  . MIDDLE EAR SURGERY Left 08/18/2015   repair eardrum and reconstruction  . OVARY SURGERY  40 years ago   wedge  . TOTAL HIP ARTHROPLASTY  Right 07/30/2014   Procedure: TOTAL HIP ARTHROPLASTY ANTERIOR APPROACH;  Surgeon: Melrose Nakayama, MD;  Location: [place];  Service: Orthopedics;  Laterality: Right;  . TOTAL KNEE ARTHROPLASTY Left 11/04/2015  . TOTAL KNEE ARTHROPLASTY Left 11/04/2015   Procedure: TOTAL KNEE ARTHROPLASTY;  Surgeon: Melrose Nakayama, MD;  Location: [place];  Service: Orthopedics;  Laterality: Left;  . TYMPANOPLASTY  30 years ago    Current Medications: Current Meds  Medication Sig  . acetaminophen (TYLENOL) 500 MG tablet Take 500-1,000 mg by mouth every 6 (six) hours as needed for mild pain.   Marland Kitchen acetaminophen (TYLENOL) 650 MG CR tablet Take 1,300 mg by mouth every 8 (eight) hours as needed for pain.  . Ascorbic Acid (VITAMIN C) 1000 MG tablet Take 1,000 mg by mouth daily.   Marland Kitchen BIOTIN PO Take 10,000 Units by mouth daily with breakfast.   . cetirizine (ZYRTEC) 10 MG tablet Take 10 mg by mouth daily as needed for allergies.  . Cholecalciferol (VITAMIN D-3) 1000 UNITS CAPS Take 1,000 Units by mouth daily with breakfast.   . cyanocobalamin 1000 MCG tablet Take 1,000 mcg by mouth daily.   Marland Kitchen esomeprazole (NEXIUM) 20 MG capsule Take 20 mg by mouth daily at 12 noon.  . ferrous sulfate 325 (65 FE) MG tablet Take 325 mg by mouth daily with breakfast.  . hydrochlorothiazide (HYDRODIURIL) 25 MG tablet Take 25 mg by  mouth as needed (fluid).  . LORazepam (ATIVAN) 1 MG tablet Take 1 mg by mouth at bedtime.   Marland Kitchen losartan (COZAAR) 100 MG tablet Take 100 mg by mouth daily.   . Magnesium ([employer] TRIPLE MAGNESIUM COMPLEX) 400 MG CAPS Take 400 mg by mouth daily.  . metoprolol tartrate (LOPRESSOR) 50 MG tablet Take one tablet by mouth two hours prior to CT scan  . oxybutynin (DITROPAN) 5 MG tablet Take 5 mg by mouth as needed for bladder spasms.   . potassium chloride (K-DUR) 10 MEQ tablet Take 20 mEq by mouth daily.   . pravastatin (PRAVACHOL) 20 MG tablet Take 20 mg by mouth daily.  . prednisoLONE acetate (PRED FORTE) 1 % ophthalmic  suspension Place 1 drop into both eyes daily as needed (Burning).   . pyridOXINE (VITAMIN B-6) 100 MG tablet Take 100 mg by mouth daily.  . vitamin E 400 UNIT capsule Take 400 Units by mouth daily.  . [DISCONTINUED] apixaban (ELIQUIS) 5 MG TABS tablet Take 1 tablet (5 mg total) by mouth 2 (two) times daily.     Allergies:   Patient has no known allergies.   Social History   Socioeconomic History  . Marital status: Widowed    Spouse name: Not on file  . Number of children: Not on file  . Years of education: Not on file  . Highest education level: Not on file  Occupational History  . Not on file  Tobacco Use  . Smoking status: Never Smoker  . Smokeless tobacco: Never Used  Substance and Sexual Activity  . Alcohol use: Not Currently    Comment: OCC -none now  . Drug use: No  . Sexual activity: Not on file  Other Topics Concern  . Not on file  Social History Narrative  . Not on file   Social Determinants of Health   Financial Resource Strain:   . Difficulty of Paying Living Expenses: Not on file  Food Insecurity:   . Worried About Charity fundraiser in the Last Year: Not on file  . Ran Out of Food in the Last Year: Not on file  Transportation Needs:   . Lack of Transportation (Medical): Not on file  . Lack of Transportation (Non-Medical): Not on file  Physical Activity:   . Days of Exercise per Week: Not on file  . Minutes of Exercise per Session: Not on file  Stress:   . Feeling of Stress : Not on file  Social Connections:   . Frequency of Communication with Friends and Family: Not on file  . Frequency of Social Gatherings with Friends and Family: Not on file  . Attends Religious Services: Not on file  . Active Member of Clubs or Organizations: Not on file  . Attends Archivist Meetings: Not on file  . Marital Status: Not on file     Family History: The patient's family history includes Cancer in her brother; Heart disease in her mother.  ROS:    Please see the history of present illness.    She is accompanied by daughter.  She seems to have some issues with her memory.  She is fighting to keep her independence.  She does not sleep well.  Her daughter feels that she does not share conditions/complaints concerning her health because she is frightened her independence will be taken.  All other systems reviewed and are negative.  EKGs/Labs/Other Studies Reviewed:    The following studies were reviewed today:  COR CTA 04/2019:  IMPRESSION: 1. Normal coronary origin with left dominance.  2. At least moderate (50-69%), mixed density plaque in the proximal and mid LAD. Unable to exclude severe stenosis.   FFR 04/2019: 1. Left Main: 0.99; No significant stenosis.  2. Proximal LAD: <0.50; significant stenosis. 3. LCX: 0.92; no significant stenosis. 4. RCA: 0.93; no significant stenosis and nondominant.  IMPRESSION: 1. Proximal LAD is positive by CT-FFR. Would consider cardiac cath.    EKG:  EKG sinus rhythm, right bundle, left atrial abnormality, horizontal axis.  Recent Labs: 03/25/2019: ALT 19; BUN 7; Creatinine, Ser 0.48; Hemoglobin 12.3; Platelets 262; Potassium 3.7; Sodium 135  Recent Lipid Panel No results found for: CHOL, TRIG, HDL, CHOLHDL, VLDL, LDLCALC, LDLDIRECT  Physical Exam:    VS:  BP (!) 162/78   Pulse (!) 52   Ht 5\' 4"  (1.626 m)   Wt 185 lb (83.9 kg)   SpO2 98%   BMI 31.76 kg/m     Wt Readings from Last 3 Encounters:  04/27/19 185 lb (83.9 kg)  03/25/19 180 lb (81.6 kg)  03/16/19 176 lb (79.8 kg)     GEN: Consistent with age.  Mildly overweight. No acute distress HEENT: Normal NECK: No JVD. LYMPHATICS: No lymphadenopathy CARDIAC:  RRR without murmur, gallop, or edema. VASCULAR:  Normal Pulses. No bruits. RESPIRATORY:  Clear to auscultation without rales, wheezing or rhonchi  ABDOMEN: Soft, non-tender, non-distended, No pulsatile mass, MUSCULOSKELETAL: No deformity  SKIN: Warm and  dry NEUROLOGIC:  Alert and oriented x 3 PSYCHIATRIC:  Normal affect   ASSESSMENT:    1. Coronary artery disease of native artery of native heart with stable angina pectoris (HCC)   2. Paroxysmal atrial fibrillation ([place])   3. Right bundle branch block   4. Essential hypertension   5. Type 2 diabetes mellitus with complication, without long-term current use of insulin (HCC)   6. Pure hypercholesterolemia   7. Educated about COVID-19 virus infection    PLAN:    In order of problems listed above:  1. Significant LAD CAD.  Coronary calcium score also very elevated.  FFR 0.6 in LAD territory.  Cath and probable PCI is planned.  We will also have orbital atherectomy rep available since imaging suggests significant calcification in the LAD.  We discussed a wide range of issues concerning PCI in the setting of stable coronary disease.  I am not convinced that she has any symptoms to represent angina other than when her chest was tight during atrial fibrillation with rapid ventricular response.  We discussed the possibility of bleeding on dual antiplatelet therapy and apixaban, possibility of stroke, myocardial infarction, and need for coronary bypass surgery. 2. Still feels she has occasional episodes of A. Fib. 3. Unchanged on today's EKG 4. Mild elevation in systolic blood pressure initially over 0000000 systolic but subsequently when reevaluated was 142/70 mmHg. 5. A1c should be less than 7 6. Target LDL less than 70. 7. COVID-19 vaccine has been given.  3W's is being applied.  Overall education and awareness concerning primary/secondary risk prevention was discussed in detail: LDL less than 70, hemoglobin A1c less than 7, blood pressure target less than 130/80 mmHg, >150 minutes of moderate aerobic activity per week, avoidance of smoking, weight control (via diet and exercise), and continued surveillance/management of/for obstructive sleep apnea.    Medication Adjustments/Labs and Tests  Ordered: Current medicines are reviewed at length with the patient today.  Concerns regarding medicines are outlined above.  Orders Placed This Encounter  Procedures  .  Basic metabolic panel  . CBC  . EKG 12-Lead   No orders of the defined types were placed in this encounter.   Patient Instructions  Medication Instructions:  1) DISCONTINUE Eliquis  *If you need a refill on your cardiac medications before your next appointment, please call your pharmacy*  Lab Work: BMET and CBC today  If you have labs (blood work) drawn today and your tests are completely normal, you will receive your results only by: Marland Kitchen MyChart Message (if you have MyChart) OR . A paper copy in the mail If you have any lab test that is abnormal or we need to change your treatment, we will call you to review the results.  Testing/Procedures: Your physician has requested that you have a cardiac catheterization. Cardiac catheterization is used to diagnose and/or treat various heart conditions. Doctors may recommend this procedure for a number of different reasons. The most common reason is to evaluate chest pain. Chest pain can be a symptom of coronary artery disease (CAD), and cardiac catheterization can show whether plaque is narrowing or blocking your heart's arteries. This procedure is also used to evaluate the valves, as well as measure the blood flow and oxygen levels in different parts of your heart. For further information please visit HugeFiesta.tn. Please follow instruction sheet, as given.   Follow-Up: At [facility], you and your health needs are our priority.  As part of our continuing mission to provide you with exceptional heart care, we have created designated Provider Care Teams.  These Care Teams include your primary Cardiologist (physician) and Advanced Practice Providers (APPs -  Physician Assistants and Nurse Practitioners) who all work together to provide you with the care you need, when you  need it.  Your next appointment:   2 week(s)  The format for your next appointment:   In Person  Provider:   You may see Sinclair Grooms, MD or one of the following Advanced Practice Providers on your designated Care Team:    Truitt Merle, NP  Cecilie Kicks, NP  Kathyrn Drown, NP   Other Instructions     [place] OFFICE [place], [place] [address] Dept: [place]: [place]  04/27/2019  You are scheduled for a Cardiac Catheterization on Monday, February 22 with Dr. Daneen Schick.  1. Please arrive at the [facility] (Main Entrance A) at [facility]: [address] [place], [place] [address] at 5:30 AM (This time is two hours before your procedure to ensure your preparation). Free valet parking service is available.   Special note: Every effort is made to have your procedure done on time. Please understand that emergencies sometimes delay scheduled procedures.  2. Diet: Do not eat solid foods after midnight.  The patient may have clear liquids until 5am upon the day of the procedure.  3. Labs: You will have labs drawn today.  4. Medication instructions in preparation for your procedure:   Contrast Allergy: No  You will need to hold your Hydrochlorothiazide and Potassium the morning of the procedure.  On the morning of your procedure, take your Aspirin and any morning medicines NOT listed above.  You may use sips of water.  5. Plan for one night stay--bring personal belongings. 6. Bring a current list of your medications and current insurance cards. 7. You MUST have a responsible person to drive you home. 8. Someone MUST be  with you the first 24 hours after you arrive home or your discharge will be delayed. 9. Please wear clothes that are easy to get on and off and wear slip-on shoes.  Thank you for allowing [place] to care for you!   --  [place] [facility]      Signed, Sinclair Grooms, MD  04/27/2019 5:13 PM    [place]

## 2019-04-27 ENCOUNTER — Encounter: Payer: Self-pay | Admitting: Interventional Cardiology

## 2019-04-27 ENCOUNTER — Ambulatory Visit (INDEPENDENT_AMBULATORY_CARE_PROVIDER_SITE_OTHER): Payer: Medicare Other | Admitting: Interventional Cardiology

## 2019-04-27 ENCOUNTER — Other Ambulatory Visit: Payer: Self-pay

## 2019-04-27 ENCOUNTER — Telehealth: Payer: Self-pay | Admitting: Interventional Cardiology

## 2019-04-27 VITALS — BP 162/78 | HR 52 | Ht 64.0 in | Wt 185.0 lb

## 2019-04-27 DIAGNOSIS — I2510 Atherosclerotic heart disease of native coronary artery without angina pectoris: Secondary | ICD-10-CM | POA: Diagnosis not present

## 2019-04-27 DIAGNOSIS — I25118 Atherosclerotic heart disease of native coronary artery with other forms of angina pectoris: Secondary | ICD-10-CM

## 2019-04-27 DIAGNOSIS — I480 Paroxysmal atrial fibrillation: Secondary | ICD-10-CM | POA: Diagnosis not present

## 2019-04-27 DIAGNOSIS — Z7189 Other specified counseling: Secondary | ICD-10-CM | POA: Diagnosis not present

## 2019-04-27 DIAGNOSIS — E7800 Pure hypercholesterolemia, unspecified: Secondary | ICD-10-CM | POA: Diagnosis not present

## 2019-04-27 DIAGNOSIS — I2584 Coronary atherosclerosis due to calcified coronary lesion: Secondary | ICD-10-CM | POA: Diagnosis not present

## 2019-04-27 DIAGNOSIS — I4510 Unspecified right bundle-branch block: Secondary | ICD-10-CM | POA: Diagnosis not present

## 2019-04-27 DIAGNOSIS — I10 Essential (primary) hypertension: Secondary | ICD-10-CM | POA: Diagnosis not present

## 2019-04-27 DIAGNOSIS — E118 Type 2 diabetes mellitus with unspecified complications: Secondary | ICD-10-CM

## 2019-04-27 NOTE — Telephone Encounter (Signed)
Pt called stated she would like her son to be in the room with her she is hard of hearing please give her a call back.

## 2019-04-27 NOTE — Patient Instructions (Addendum)
Medication Instructions:  1) DISCONTINUE Eliquis  *If you need a refill on your cardiac medications before your next appointment, please call your pharmacy*  Lab Work: BMET and CBC today  If you have labs (blood work) drawn today and your tests are completely normal, you will receive your results only by: Marland Kitchen MyChart Message (if you have MyChart) OR . A paper copy in the mail If you have any lab test that is abnormal or we need to change your treatment, we will call you to review the results.  Testing/Procedures: Your physician has requested that you have a cardiac catheterization. Cardiac catheterization is used to diagnose and/or treat various heart conditions. Doctors may recommend this procedure for a number of different reasons. The most common reason is to evaluate chest pain. Chest pain can be a symptom of coronary artery disease (CAD), and cardiac catheterization can show whether plaque is narrowing or blocking your heart's arteries. This procedure is also used to evaluate the valves, as well as measure the blood flow and oxygen levels in different parts of your heart. For further information please visit HugeFiesta.tn. Please follow instruction sheet, as given.   Follow-Up: At [facility], you and your health needs are our priority.  As part of our continuing mission to provide you with exceptional heart care, we have created designated Provider Care Teams.  These Care Teams include your primary Cardiologist (physician) and Advanced Practice Providers (APPs -  Physician Assistants and Nurse Practitioners) who all work together to provide you with the care you need, when you need it.  Your next appointment:   2 week(s)  The format for your next appointment:   In Person  Provider:   You may see Sinclair Grooms, MD or one of the following Advanced Practice Providers on your designated Care Team:    Truitt Merle, NP  Cecilie Kicks, NP  Kathyrn Drown, NP   Other  Instructions     [place] OFFICE [place], [place] [address] Dept: [place]: [place]  04/27/2019  You are scheduled for a Cardiac Catheterization on Monday, February 22 with Dr. Daneen Schick.  1. Please arrive at the [facility] (Main Entrance A) at [facility]: [address] [place], [place] [address] at 5:30 AM (This time is two hours before your procedure to ensure your preparation). Free valet parking service is available.   Special note: Every effort is made to have your procedure done on time. Please understand that emergencies sometimes delay scheduled procedures.  2. Diet: Do not eat solid foods after midnight.  The patient may have clear liquids until 5am upon the day of the procedure.  3. Labs: You will have labs drawn today.  4. Medication instructions in preparation for your procedure:   Contrast Allergy: No  You will need to hold your Hydrochlorothiazide and Potassium the morning of the procedure.  On the morning of your procedure, take your Aspirin and any morning medicines NOT listed above.  You may use sips of water.  5. Plan for one night stay--bring personal belongings. 6. Bring a current list of your medications and current insurance cards. 7. You MUST have a responsible person to drive you home. 8. Someone MUST be with you the first 24 hours after you arrive home or your discharge will be delayed. 9. Please wear clothes that are easy to get on and off  and wear slip-on shoes.  Thank you for allowing [place] to care for you!   -- [place] [facility]

## 2019-04-27 NOTE — Telephone Encounter (Signed)
Spoke with pt and made her aware ok for son to accompany

## 2019-04-28 ENCOUNTER — Other Ambulatory Visit (HOSPITAL_COMMUNITY)
Admission: RE | Admit: 2019-04-28 | Discharge: 2019-04-28 | Disposition: A | Discharge status: Home / Self Care | Payer: Medicare Other | Source: Ambulatory Visit | Attending: Interventional Cardiology | Admitting: Interventional Cardiology

## 2019-04-28 DIAGNOSIS — I10 Essential (primary) hypertension: Secondary | ICD-10-CM

## 2019-04-28 DIAGNOSIS — Z20822 Contact with and (suspected) exposure to covid-19: Secondary | ICD-10-CM | POA: Insufficient documentation

## 2019-04-28 DIAGNOSIS — I2510 Atherosclerotic heart disease of native coronary artery without angina pectoris: Secondary | ICD-10-CM

## 2019-04-28 DIAGNOSIS — Z01812 Encounter for preprocedural laboratory examination: Secondary | ICD-10-CM | POA: Insufficient documentation

## 2019-04-28 DIAGNOSIS — E785 Hyperlipidemia, unspecified: Secondary | ICD-10-CM

## 2019-04-28 LAB — BASIC METABOLIC PANEL
BUN/Creatinine Ratio: 14 (ref 12–28)
BUN: 7 mg/dL — ABNORMAL LOW (ref 8–27)
CO2: 24 mmol/L (ref 20–29)
Calcium: 9.3 mg/dL (ref 8.7–10.3)
Chloride: 94 mmol/L — ABNORMAL LOW (ref 96–106)
Creatinine, Ser: 0.51 mg/dL — ABNORMAL LOW (ref 0.57–1.00)
GFR calc Af Amer: 104 mL/min/{1.73_m2} (ref >59)
GFR calc non Af Amer: 90 mL/min/{1.73_m2} (ref >59)
Glucose: 106 mg/dL — ABNORMAL HIGH (ref 65–99)
Potassium: 4.2 mmol/L (ref 3.5–5.2)
Sodium: 133 mmol/L — ABNORMAL LOW (ref 134–144)

## 2019-04-28 LAB — CBC
Hematocrit: 34.5 % (ref 34.0–46.6)
Hemoglobin: 11.7 g/dL (ref 11.1–15.9)
MCH: 31.2 pg (ref 26.6–33.0)
MCHC: 33.9 g/dL (ref 31.5–35.7)
MCV: 92 fL (ref 79–97)
Platelets: 310 10*3/uL (ref 150–450)
RBC: 3.75 x10E6/uL — ABNORMAL LOW (ref 3.77–5.28)
RDW: 12.8 % (ref 11.7–15.4)
WBC: 8.1 10*3/uL (ref 3.4–10.8)

## 2019-04-28 LAB — SARS CORONAVIRUS 2 (TAT 6-24 HRS): SARS Coronavirus 2: NEGATIVE

## 2019-04-30 ENCOUNTER — Other Ambulatory Visit: Payer: Self-pay

## 2019-04-30 ENCOUNTER — Ambulatory Visit (HOSPITAL_COMMUNITY)
Admission: RE | Admit: 2019-04-30 | Discharge: 2019-05-01 | Disposition: A | Discharge status: Home / Self Care | Payer: Medicare Other | Attending: Interventional Cardiology | Admitting: Interventional Cardiology

## 2019-04-30 ENCOUNTER — Encounter (HOSPITAL_COMMUNITY)
Admission: RE | Disposition: A | Discharge status: Home / Self Care | Payer: Self-pay | Source: Home / Self Care | Attending: Interventional Cardiology

## 2019-04-30 DIAGNOSIS — I10 Essential (primary) hypertension: Secondary | ICD-10-CM | POA: Diagnosis not present

## 2019-04-30 DIAGNOSIS — Z853 Personal history of malignant neoplasm of breast: Secondary | ICD-10-CM | POA: Diagnosis not present

## 2019-04-30 DIAGNOSIS — I480 Paroxysmal atrial fibrillation: Secondary | ICD-10-CM | POA: Diagnosis not present

## 2019-04-30 DIAGNOSIS — E785 Hyperlipidemia, unspecified: Secondary | ICD-10-CM

## 2019-04-30 DIAGNOSIS — Z7901 Long term (current) use of anticoagulants: Secondary | ICD-10-CM | POA: Insufficient documentation

## 2019-04-30 DIAGNOSIS — I2510 Atherosclerotic heart disease of native coronary artery without angina pectoris: Secondary | ICD-10-CM | POA: Diagnosis not present

## 2019-04-30 DIAGNOSIS — I25118 Atherosclerotic heart disease of native coronary artery with other forms of angina pectoris: Secondary | ICD-10-CM | POA: Insufficient documentation

## 2019-04-30 DIAGNOSIS — M1611 Unilateral primary osteoarthritis, right hip: Secondary | ICD-10-CM | POA: Insufficient documentation

## 2019-04-30 DIAGNOSIS — I4510 Unspecified right bundle-branch block: Secondary | ICD-10-CM | POA: Diagnosis not present

## 2019-04-30 DIAGNOSIS — E119 Type 2 diabetes mellitus without complications: Secondary | ICD-10-CM | POA: Diagnosis not present

## 2019-04-30 DIAGNOSIS — K219 Gastro-esophageal reflux disease without esophagitis: Secondary | ICD-10-CM | POA: Insufficient documentation

## 2019-04-30 DIAGNOSIS — E7800 Pure hypercholesterolemia, unspecified: Secondary | ICD-10-CM | POA: Insufficient documentation

## 2019-04-30 DIAGNOSIS — Z955 Presence of coronary angioplasty implant and graft: Secondary | ICD-10-CM

## 2019-04-30 DIAGNOSIS — M1712 Unilateral primary osteoarthritis, left knee: Secondary | ICD-10-CM | POA: Diagnosis not present

## 2019-04-30 DIAGNOSIS — Z79899 Other long term (current) drug therapy: Secondary | ICD-10-CM | POA: Insufficient documentation

## 2019-04-30 HISTORY — PX: CORONARY ATHERECTOMY: CATH118238

## 2019-04-30 HISTORY — PX: LEFT HEART CATH AND CORONARY ANGIOGRAPHY: CATH118249

## 2019-04-30 HISTORY — PX: CORONARY STENT INTERVENTION: CATH118234

## 2019-04-30 LAB — POCT ACTIVATED CLOTTING TIME
Activated Clotting Time: 274 seconds
Activated Clotting Time: 296 seconds
Activated Clotting Time: 312 seconds
Activated Clotting Time: 329 seconds
Activated Clotting Time: 290 seconds

## 2019-04-30 LAB — CBC
HCT: 35 % — ABNORMAL LOW (ref 36.0–46.0)
Hemoglobin: 11.7 g/dL — ABNORMAL LOW (ref 12.0–15.0)
MCH: 31 pg (ref 26.0–34.0)
MCHC: 33.4 g/dL (ref 30.0–36.0)
MCV: 92.8 fL (ref 80.0–100.0)
Platelets: 289 10*3/uL (ref 150–400)
RBC: 3.77 MIL/uL — ABNORMAL LOW (ref 3.87–5.11)
RDW: 12.8 % (ref 11.5–15.5)
WBC: 7.1 10*3/uL (ref 4.0–10.5)
nRBC: 0 % (ref 0.0–0.2)

## 2019-04-30 LAB — CREATININE, SERUM
Creatinine, Ser: 0.74 mg/dL (ref 0.44–1.00)
GFR calc Af Amer: >60 mL/min (ref >60)
GFR calc non Af Amer: >60 mL/min (ref >60)

## 2019-04-30 SURGERY — LEFT HEART CATH AND CORONARY ANGIOGRAPHY
Anesthesia: LOCAL

## 2019-04-30 MED ORDER — TIROFIBAN (AGGRASTAT) BOLUS VIA INFUSION
INTRAVENOUS | Status: DC | PRN
  Administered 2019-04-30: 2040 ug via INTRAVENOUS

## 2019-04-30 MED ORDER — VIPERSLIDE LUBRICANT OPTIME
TOPICAL | Status: DC | PRN
  Administered 2019-04-30: 500 mL via SURGICAL_CAVITY

## 2019-04-30 MED ORDER — HYDROCHLOROTHIAZIDE 25 MG PO TABS: 25.0000 mg | ORAL_TABLET | ORAL | Status: DC | PRN

## 2019-04-30 MED ORDER — OXYBUTYNIN CHLORIDE 5 MG PO TABS: 5.0000 mg | ORAL_TABLET | ORAL | Status: DC | PRN

## 2019-04-30 MED ORDER — SODIUM CHLORIDE 0.9% FLUSH: 3.0000 mL | Freq: Two times a day (BID) | INTRAVENOUS | Status: DC

## 2019-04-30 MED ORDER — NITROGLYCERIN 1 MG/10 ML FOR IR/CATH LAB
INTRA_ARTERIAL | Status: DC | PRN
  Administered 2019-04-30 (×2): 200 ug via INTRACORONARY

## 2019-04-30 MED ORDER — LIDOCAINE HCL (PF) 1 % IJ SOLN
INTRAMUSCULAR | Status: AC
  Filled 2019-04-30: qty 30

## 2019-04-30 MED ORDER — VITAMIN B-6 100 MG PO TABS
100.0000 mg | ORAL_TABLET | Freq: Every day | ORAL | Status: DC
  Administered 2019-05-01: 100 mg via ORAL
  Filled 2019-04-30: qty 1

## 2019-04-30 MED ORDER — HEPARIN (PORCINE) IN NACL 1000-0.9 UT/500ML-% IV SOLN
INTRAVENOUS | Status: AC
  Filled 2019-04-30: qty 500

## 2019-04-30 MED ORDER — TIROFIBAN HCL IN NACL 5-0.9 MG/100ML-% IV SOLN
INTRAVENOUS | Status: AC
  Filled 2019-04-30: qty 100

## 2019-04-30 MED ORDER — TICAGRELOR 90 MG PO TABS
ORAL_TABLET | ORAL | Status: AC
  Filled 2019-04-30: qty 2

## 2019-04-30 MED ORDER — VITAMIN D 25 MCG (1000 UNIT) PO TABS
1000.0000 [IU] | ORAL_TABLET | Freq: Every day | ORAL | Status: DC
  Administered 2019-05-01: 1000 [IU] via ORAL
  Filled 2019-04-30: qty 1

## 2019-04-30 MED ORDER — LABETALOL HCL 5 MG/ML IV SOLN: 10.0000 mg | INTRAVENOUS | Status: AC | PRN

## 2019-04-30 MED ORDER — LOSARTAN POTASSIUM 50 MG PO TABS
100.0000 mg | ORAL_TABLET | Freq: Every day | ORAL | Status: DC
  Administered 2019-04-30 – 2019-05-01 (×2): 100 mg via ORAL
  Filled 2019-04-30 (×2): qty 2

## 2019-04-30 MED ORDER — TIROFIBAN HCL IN NACL 5-0.9 MG/100ML-% IV SOLN
INTRAVENOUS | Status: AC | PRN
  Administered 2019-04-30: 0.075 ug/kg/min via INTRAVENOUS

## 2019-04-30 MED ORDER — ONDANSETRON HCL 4 MG/2ML IJ SOLN: 4.0000 mg | Freq: Four times a day (QID) | INTRAMUSCULAR | Status: DC | PRN

## 2019-04-30 MED ORDER — TICAGRELOR 90 MG PO TABS
ORAL_TABLET | ORAL | Status: DC | PRN
  Administered 2019-04-30: 180 mg via ORAL

## 2019-04-30 MED ORDER — LORATADINE 10 MG PO TABS: 10.0000 mg | ORAL_TABLET | Freq: Every day | ORAL | Status: DC

## 2019-04-30 MED ORDER — HEPARIN (PORCINE) IN NACL 1000-0.9 UT/500ML-% IV SOLN
INTRAVENOUS | Status: DC | PRN
  Administered 2019-04-30: 500 mL

## 2019-04-30 MED ORDER — ASPIRIN 81 MG PO CHEW
CHEWABLE_TABLET | ORAL | Status: AC
  Filled 2019-04-30: qty 1

## 2019-04-30 MED ORDER — SODIUM CHLORIDE 0.9 % IV SOLN: INTRAVENOUS | Status: AC

## 2019-04-30 MED ORDER — NITROGLYCERIN 1 MG/10 ML FOR IR/CATH LAB
INTRA_ARTERIAL | Status: AC
  Filled 2019-04-30: qty 10

## 2019-04-30 MED ORDER — VITAMIN E 180 MG (400 UNIT) PO CAPS
400.0000 [IU] | ORAL_CAPSULE | Freq: Every day | ORAL | Status: DC
  Administered 2019-05-01: 400 [IU] via ORAL
  Filled 2019-04-30: qty 1

## 2019-04-30 MED ORDER — SODIUM CHLORIDE 0.9 % WEIGHT BASED INFUSION
3.0000 mL/kg/h | INTRAVENOUS | Status: DC
  Administered 2019-04-30: 3 mL/kg/h via INTRAVENOUS

## 2019-04-30 MED ORDER — HEPARIN SODIUM (PORCINE) 1000 UNIT/ML IJ SOLN
INTRAMUSCULAR | Status: AC
  Filled 2019-04-30: qty 1

## 2019-04-30 MED ORDER — PREDNISOLONE ACETATE 1 % OP SUSP: 1.0000 [drp] | Freq: Every day | OPHTHALMIC | Status: DC | PRN

## 2019-04-30 MED ORDER — APIXABAN 5 MG PO TABS
5.0000 mg | ORAL_TABLET | Freq: Two times a day (BID) | ORAL | Status: DC
  Administered 2019-04-30: 5 mg via ORAL
  Filled 2019-04-30: qty 1

## 2019-04-30 MED ORDER — HEPARIN (PORCINE) IN NACL 1000-0.9 UT/500ML-% IV SOLN
INTRAVENOUS | Status: AC
  Filled 2019-04-30: qty 1000

## 2019-04-30 MED ORDER — TICAGRELOR 90 MG PO TABS
90.0000 mg | ORAL_TABLET | Freq: Two times a day (BID) | ORAL | Status: DC
  Administered 2019-04-30: 90 mg via ORAL
  Filled 2019-04-30: qty 1

## 2019-04-30 MED ORDER — VERAPAMIL HCL 2.5 MG/ML IV SOLN
INTRAVENOUS | Status: AC
  Filled 2019-04-30: qty 2

## 2019-04-30 MED ORDER — SODIUM CHLORIDE 0.9 % IV SOLN: 250.0000 mL | INTRAVENOUS | Status: DC | PRN

## 2019-04-30 MED ORDER — VITAMIN B-12 1000 MCG PO TABS
1000.0000 ug | ORAL_TABLET | Freq: Every day | ORAL | Status: DC
  Administered 2019-05-01: 1000 ug via ORAL
  Filled 2019-04-30: qty 1

## 2019-04-30 MED ORDER — MAGNESIUM OXIDE 400 (241.3 MG) MG PO TABS
400.0000 mg | ORAL_TABLET | Freq: Every day | ORAL | Status: DC
  Administered 2019-05-01: 400 mg via ORAL
  Filled 2019-04-30: qty 1

## 2019-04-30 MED ORDER — MIDAZOLAM HCL 2 MG/2ML IJ SOLN
INTRAMUSCULAR | Status: AC
  Filled 2019-04-30: qty 2

## 2019-04-30 MED ORDER — PANTOPRAZOLE SODIUM 40 MG PO TBEC
40.0000 mg | DELAYED_RELEASE_TABLET | Freq: Every day | ORAL | Status: DC
  Administered 2019-04-30 – 2019-05-01 (×2): 40 mg via ORAL
  Filled 2019-04-30 (×2): qty 1

## 2019-04-30 MED ORDER — TIROFIBAN HCL IV 12.5 MG/250 ML
0.0750 ug/kg/min | INTRAVENOUS | Status: DC
  Filled 2019-04-30: qty 250

## 2019-04-30 MED ORDER — HEPARIN SODIUM (PORCINE) 5000 UNIT/ML IJ SOLN: 5000.0000 [IU] | Freq: Three times a day (TID) | INTRAMUSCULAR | Status: DC

## 2019-04-30 MED ORDER — VERAPAMIL HCL 2.5 MG/ML IV SOLN
INTRAVENOUS | Status: DC | PRN
  Administered 2019-04-30: 10 mL via INTRA_ARTERIAL

## 2019-04-30 MED ORDER — POTASSIUM CHLORIDE CRYS ER 20 MEQ PO TBCR
20.0000 meq | EXTENDED_RELEASE_TABLET | Freq: Every day | ORAL | Status: DC
  Administered 2019-05-01: 20 meq via ORAL
  Filled 2019-04-30: qty 1

## 2019-04-30 MED ORDER — FENTANYL CITRATE (PF) 100 MCG/2ML IJ SOLN
INTRAMUSCULAR | Status: DC | PRN
  Administered 2019-04-30 (×2): 25 ug via INTRAVENOUS

## 2019-04-30 MED ORDER — ASPIRIN 81 MG PO CHEW
81.0000 mg | CHEWABLE_TABLET | ORAL | Status: AC
  Administered 2019-04-30: 81 mg via ORAL

## 2019-04-30 MED ORDER — MIDAZOLAM HCL 2 MG/2ML IJ SOLN
INTRAMUSCULAR | Status: DC | PRN
  Administered 2019-04-30 (×2): 1 mg via INTRAVENOUS

## 2019-04-30 MED ORDER — SODIUM CHLORIDE 0.9% FLUSH
3.0000 mL | Freq: Two times a day (BID) | INTRAVENOUS | Status: DC
  Administered 2019-04-30 – 2019-05-01 (×3): 3 mL via INTRAVENOUS

## 2019-04-30 MED ORDER — FERROUS SULFATE 325 (65 FE) MG PO TABS
325.0000 mg | ORAL_TABLET | Freq: Every day | ORAL | Status: DC
  Administered 2019-05-01: 325 mg via ORAL
  Filled 2019-04-30: qty 1

## 2019-04-30 MED ORDER — ACETAMINOPHEN 325 MG PO TABS: 650.0000 mg | ORAL_TABLET | ORAL | Status: DC | PRN

## 2019-04-30 MED ORDER — HEPARIN SODIUM (PORCINE) 1000 UNIT/ML IJ SOLN
INTRAMUSCULAR | Status: DC | PRN
  Administered 2019-04-30: 3000 [IU] via INTRAVENOUS
  Administered 2019-04-30: 2000 [IU] via INTRAVENOUS
  Administered 2019-04-30: 4000 [IU] via INTRAVENOUS
  Administered 2019-04-30: 2500 [IU] via INTRAVENOUS
  Administered 2019-04-30: 6000 [IU] via INTRAVENOUS

## 2019-04-30 MED ORDER — HYDRALAZINE HCL 20 MG/ML IJ SOLN: 10.0000 mg | INTRAMUSCULAR | Status: AC | PRN

## 2019-04-30 MED ORDER — IOHEXOL 350 MG/ML SOLN
INTRAVENOUS | Status: DC | PRN
  Administered 2019-04-30: 220 mL

## 2019-04-30 MED ORDER — SODIUM CHLORIDE 0.9% FLUSH: 3.0000 mL | INTRAVENOUS | Status: DC | PRN

## 2019-04-30 MED ORDER — LIDOCAINE HCL (PF) 1 % IJ SOLN
INTRAMUSCULAR | Status: DC | PRN
  Administered 2019-04-30: 2 mL

## 2019-04-30 MED ORDER — ACETAMINOPHEN 500 MG PO TABS: 500.0000 mg | ORAL_TABLET | Freq: Four times a day (QID) | ORAL | Status: DC | PRN

## 2019-04-30 MED ORDER — FENTANYL CITRATE (PF) 100 MCG/2ML IJ SOLN
INTRAMUSCULAR | Status: AC
  Filled 2019-04-30: qty 2

## 2019-04-30 MED ORDER — SODIUM CHLORIDE 0.9 % WEIGHT BASED INFUSION: 1.0000 mL/kg/h | INTRAVENOUS | Status: DC

## 2019-04-30 MED ORDER — PRAVASTATIN SODIUM 10 MG PO TABS
20.0000 mg | ORAL_TABLET | Freq: Every day | ORAL | Status: DC
  Administered 2019-04-30: 20 mg via ORAL
  Filled 2019-04-30: qty 2

## 2019-04-30 MED ORDER — HEPARIN (PORCINE) IN NACL 1000-0.9 UT/500ML-% IV SOLN
INTRAVENOUS | Status: DC | PRN
  Administered 2019-04-30 (×2): 500 mL

## 2019-04-30 MED ORDER — ASPIRIN 81 MG PO CHEW
81.0000 mg | CHEWABLE_TABLET | Freq: Every day | ORAL | Status: DC
  Administered 2019-05-01: 81 mg via ORAL
  Filled 2019-04-30: qty 1

## 2019-04-30 MED ORDER — LORAZEPAM 1 MG PO TABS
1.0000 mg | ORAL_TABLET | Freq: Every day | ORAL | Status: DC
  Administered 2019-04-30: 1 mg via ORAL
  Filled 2019-04-30: qty 1

## 2019-04-30 SURGICAL SUPPLY — 22 items
BALLN SAPPHIRE 2.75X20 (BALLOONS) ×2
BALLN SAPPHIRE ~~LOC~~ 3.0X18 (BALLOONS) ×2 IMPLANT
BALLN SAPPHIRE ~~LOC~~ 3.25X12 (BALLOONS) ×2 IMPLANT
BALLOON SAPPHIRE 2.75X20 (BALLOONS) ×1 IMPLANT
CATH 5FR JL3.5 JR4 ANG PIG MP (CATHETERS) ×2 IMPLANT
CATH TELEPORT (CATHETERS) ×2 IMPLANT
CATH VISTA GUIDE 6FR XBLAD3.5 (CATHETERS) ×2 IMPLANT
CROWN DIAMONDBACK CLASSIC 1.25 (BURR) ×2 IMPLANT
DEVICE RAD COMP TR BAND LRG (VASCULAR PRODUCTS) ×2 IMPLANT
ELECT DEFIB PAD ADLT CADENCE (PAD) ×2 IMPLANT
GLIDESHEATH SLEND A-KIT 6F 22G (SHEATH) ×2 IMPLANT
GUIDEWIRE INQWIRE 1.5J.035X260 (WIRE) ×1 IMPLANT
INQWIRE 1.5J .035X260CM (WIRE) ×2
KIT ENCORE 26 ADVANTAGE (KITS) ×2 IMPLANT
KIT HEART LEFT (KITS) ×2 IMPLANT
LUBRICANT VIPERSLIDE CORONARY (MISCELLANEOUS) ×2 IMPLANT
PACK CARDIAC CATHETERIZATION (CUSTOM PROCEDURE TRAY) ×2 IMPLANT
STENT RESOLUTE ONYX 2.75X38 (Permanent Stent) ×2 IMPLANT
TRANSDUCER W/STOPCOCK (MISCELLANEOUS) ×2 IMPLANT
TUBING CIL FLEX 10 FLL-RA (TUBING) ×2 IMPLANT
WIRE ASAHI PROWATER 300CM (WIRE) ×2 IMPLANT
WIRE VIPERWIRE COR FLEX .012 (WIRE) ×2 IMPLANT

## 2019-04-30 NOTE — Interval H&P Note (Signed)
History and Physical Interval Note:  04/30/2019 7:41 AM  Sheila Gardner  has presented today for surgery, with the diagnosis of abnormal cardiac CT.  The various methods of treatment have been discussed with the patient and family. After consideration of risks, benefits and other options for treatment, the patient has consented to  Procedure(s): LEFT HEART CATH AND CORONARY ANGIOGRAPHY (N/A) as a surgical intervention.  The patient's history has been reviewed, patient examined, no change in status, stable for surgery.  I have reviewed the patient's chart and labs.  Questions were answered to the patient's satisfaction.     [facility] Lab Visit (complete for each Cath Lab visit)  Clinical Evaluation Leading to the Procedure:   ACS: No.  Non-ACS:    Anginal Classification: CCS III  Anti-ischemic medical therapy: Minimal Therapy (1 class of medications)  Non-Invasive Test Results: High-risk stress test findings: cardiac mortality >3%/year  Prior CABG: No previous CABG

## 2019-04-30 NOTE — CV Procedure (Signed)
   Diagnostic angiography via right radial using real-time vascular ultrasound for access.  Segmental 50, 80, and 60% diffuse disease from proximal to mid LAD.  Eccentric 40 to 50% further mid LAD stenosis.  Continuation of the circumflex contains 50 to 70% stenosis ([place] 010 with a large obtuse marginal.  Codominant right coronary  Normal LV function and EDP  Orbital atherectomy followed by stent implantation from the mid to proximal LAD using a 2 seven 5 x 38 Onyx DES postdilated to 3.25 mmHg in diameter in the proximal segment.  The entire stented segment was postdilated to 3.0 mm.  No ischemic discomfort post procedure, EKG is stable, post procedure ACT 329 and 9:30AM.  Patient received 600 mg of Plavix at the inception of PCI approximately 2 hours ago.  Post procedure, I realized that 600 mg of Plavix had not been given (I thought I had instructed that it be given at the same time as heparin was administered when we were converting from diagnostic to interventional.  Therefore a bolus of Aggrastat is given and a Brilinta slurry.  Overnight stay given failure to start antiplatelet therapy preintervention.

## 2019-04-30 NOTE — Progress Notes (Signed)
[place] for tirofiban Indication: chest pain/ACS  No Known Allergies  Patient Measurements: Height: 5' 4.5" (163.8 cm) Weight: 180 lb (81.6 kg) IBW/kg (Calculated) : 55.85   Vital Signs: Temp: 97.9 F (36.6 C) (02/22 1209) Temp Source: Oral (02/22 1209) BP: 160/50 (02/22 1209) Pulse Rate: 40 (02/22 1209)  Labs: Recent Labs    04/27/19 1452  HGB 11.7  HCT 34.5  PLT 310  CREATININE 0.51*    Estimated Creatinine Clearance: 57.6 mL/min (A) (by C-G formula based on SCr of 0.51 mg/dL (L)).   Medical History: Past Medical History:  Diagnosis Date  . Arthritis   . Breast cancer ([place])   . Cancer ([place])    Breast- rt  . Cough with sputum 2016  . Elevated liver function tests 05/09/2013  . GERD (gastroesophageal reflux disease)    COSTOCHONDRITIS  . Heart murmur   . Hip pain 05/08/2014  . Hyperlipidemia   . Hypertension   . Lipoma    back of neck  . PONV (postoperative nausea and vomiting)    only after rt hip surgery  . Primary osteoarthritis of left knee 11/04/2015  . Primary osteoarthritis of right hip 07/30/2014   Assessment: 84 year old female s/p cath now with DES placed to LAD.   Patient to continue tirofiban x 4 hours post cath, crcl<60 will continue at reduced rate.   Plans are to restart apixaban this evening.   Goal of Therapy:  Monitor platelets by anticoagulation protocol: Yes   Plan:  Tirofiban 0.016mcg/kg/min x 4 hours  Erin Hearing PharmD., BCPS Clinical Pharmacist 04/30/2019 12:29 PM

## 2019-04-30 NOTE — Progress Notes (Signed)
Pt's daughter was updated about the bed asignement for her mom.

## 2019-05-01 DIAGNOSIS — I480 Paroxysmal atrial fibrillation: Secondary | ICD-10-CM

## 2019-05-01 DIAGNOSIS — E785 Hyperlipidemia, unspecified: Secondary | ICD-10-CM

## 2019-05-01 DIAGNOSIS — I10 Essential (primary) hypertension: Secondary | ICD-10-CM | POA: Diagnosis not present

## 2019-05-01 DIAGNOSIS — R001 Bradycardia, unspecified: Secondary | ICD-10-CM | POA: Diagnosis not present

## 2019-05-01 DIAGNOSIS — M1611 Unilateral primary osteoarthritis, right hip: Secondary | ICD-10-CM | POA: Diagnosis not present

## 2019-05-01 DIAGNOSIS — I25118 Atherosclerotic heart disease of native coronary artery with other forms of angina pectoris: Secondary | ICD-10-CM | POA: Diagnosis not present

## 2019-05-01 DIAGNOSIS — M1712 Unilateral primary osteoarthritis, left knee: Secondary | ICD-10-CM | POA: Diagnosis not present

## 2019-05-01 DIAGNOSIS — R931 Abnormal findings on diagnostic imaging of heart and coronary circulation: Secondary | ICD-10-CM | POA: Diagnosis not present

## 2019-05-01 DIAGNOSIS — K219 Gastro-esophageal reflux disease without esophagitis: Secondary | ICD-10-CM | POA: Diagnosis not present

## 2019-05-01 DIAGNOSIS — E119 Type 2 diabetes mellitus without complications: Secondary | ICD-10-CM | POA: Diagnosis not present

## 2019-05-01 DIAGNOSIS — Z853 Personal history of malignant neoplasm of breast: Secondary | ICD-10-CM | POA: Diagnosis not present

## 2019-05-01 DIAGNOSIS — I4510 Unspecified right bundle-branch block: Secondary | ICD-10-CM | POA: Diagnosis not present

## 2019-05-01 DIAGNOSIS — I2510 Atherosclerotic heart disease of native coronary artery without angina pectoris: Secondary | ICD-10-CM | POA: Diagnosis not present

## 2019-05-01 DIAGNOSIS — E7800 Pure hypercholesterolemia, unspecified: Secondary | ICD-10-CM | POA: Diagnosis not present

## 2019-05-01 DIAGNOSIS — Z7901 Long term (current) use of anticoagulants: Secondary | ICD-10-CM | POA: Diagnosis not present

## 2019-05-01 LAB — CBC
HCT: 33.4 % — ABNORMAL LOW (ref 36.0–46.0)
Hemoglobin: 11.2 g/dL — ABNORMAL LOW (ref 12.0–15.0)
MCH: 31.2 pg (ref 26.0–34.0)
MCHC: 33.5 g/dL (ref 30.0–36.0)
MCV: 93 fL (ref 80.0–100.0)
Platelets: 279 10*3/uL (ref 150–400)
RBC: 3.59 MIL/uL — ABNORMAL LOW (ref 3.87–5.11)
RDW: 13 % (ref 11.5–15.5)
WBC: 7.9 10*3/uL (ref 4.0–10.5)
nRBC: 0 % (ref 0.0–0.2)

## 2019-05-01 LAB — BASIC METABOLIC PANEL
Anion gap: 9 (ref 5–15)
BUN: 11 mg/dL (ref 8–23)
CO2: 26 mmol/L (ref 22–32)
Calcium: 9.3 mg/dL (ref 8.9–10.3)
Chloride: 102 mmol/L (ref 98–111)
Creatinine, Ser: 0.61 mg/dL (ref 0.44–1.00)
GFR calc Af Amer: >60 mL/min (ref >60)
GFR calc non Af Amer: >60 mL/min (ref >60)
Glucose, Bld: 116 mg/dL — ABNORMAL HIGH (ref 70–99)
Potassium: 3.7 mmol/L (ref 3.5–5.1)
Sodium: 137 mmol/L (ref 135–145)

## 2019-05-01 MED ORDER — APIXABAN 2.5 MG PO TABS: 2.5000 mg | ORAL_TABLET | Freq: Two times a day (BID) | ORAL | 0 refills | Status: DC

## 2019-05-01 MED ORDER — ROSUVASTATIN CALCIUM 40 MG PO TABS: 40.0000 mg | ORAL_TABLET | Freq: Every day | ORAL | 6 refills | Status: DC

## 2019-05-01 MED ORDER — ANGIOPLASTY BOOK
Freq: Once | Status: AC
  Filled 2019-05-01: qty 1

## 2019-05-01 MED ORDER — TICAGRELOR 90 MG PO TABS
90.0000 mg | ORAL_TABLET | Freq: Two times a day (BID) | ORAL | Status: DC
  Administered 2019-05-01: 90 mg via ORAL
  Filled 2019-05-01: qty 1

## 2019-05-01 MED ORDER — ASPIRIN EC 81 MG PO TBEC: 81.0000 mg | DELAYED_RELEASE_TABLET | Freq: Every day | ORAL | 0 refills | Status: DC

## 2019-05-01 MED ORDER — ROSUVASTATIN CALCIUM 20 MG PO TABS: 40.0000 mg | ORAL_TABLET | Freq: Every day | ORAL | Status: DC

## 2019-05-01 MED ORDER — TICAGRELOR 90 MG PO TABS: 90.0000 mg | ORAL_TABLET | Freq: Two times a day (BID) | ORAL | 11 refills | Status: DC

## 2019-05-01 MED ORDER — APIXABAN 2.5 MG PO TABS
2.5000 mg | ORAL_TABLET | Freq: Two times a day (BID) | ORAL | Status: DC
  Administered 2019-05-01: 2.5 mg via ORAL
  Filled 2019-05-01: qty 1

## 2019-05-01 MED FILL — ELIQUIS 2.5 MG TABLET: 2.5 | 30 days supply | Qty: 60 | Fill #0

## 2019-05-01 MED FILL — ROSUVASTATIN CALCIUM 40 MG: 40 | 30 days supply | Qty: 30 | Fill #0

## 2019-05-01 MED FILL — ASPIRIN 81 MG TBEC: 81 | 30 days supply | Qty: 30 | Fill #0

## 2019-05-01 MED FILL — BRILINTA 90 MG TABLET: 90 | 30 days supply | Qty: 60 | Fill #0

## 2019-05-01 NOTE — Care Management (Addendum)
1120 05-01-19 [facility] will make the patient aware of the cost for Brilinta once filed. Bethena Roys, RN,BSN Case Manager   Cost will be $30.00 for Brilinta after the 30 day free. No further needs at this time. Bethena Roys, RN,BSN Case Manager

## 2019-05-01 NOTE — Progress Notes (Signed)
CARDIAC REHAB PHASE I   PRE:  Rate/Rhythm: 14 SB  BP:  Sitting: 148/42      SaO2: 100 RA  MODE:  Ambulation: 250 ft   POST:  Rate/Rhythm: 76 SR  BP:  Sitting: 146/79    SaO2: 100 RA  Pt helped to BR, then ambulated 245ft in hallway standby assist with front wheel walker. Pt c/o some weakness with ambulation. Pt educated on importance of ASA, Brilinta, and NTG. Reinforced signs and symptoms of bleeding. Pt given heart healthy diet. Reviewed restrictions, site care, and exercise guidelines. Will refer to CRP II [place]. Pt is interested in participating in Virtual Cardiac and Pulmonary Rehab. Pt advised that Virtual Cardiac and Pulmonary Rehab is provided at no cost to the patient.  Checklist:  1. Pt has smart device  ie smartphone and/or ipad for downloading an app  Yes 2. Reliable internet/wifi service    Yes 3. Understands how to use their smartphone and navigate within an app.  Yes  Pt verbalized understanding and is in agreement.  [id number] Rufina Falco, RN BSN 05/01/2019 10:08 AM

## 2019-05-01 NOTE — Discharge Summary (Signed)
Discharge Summary    Patient ID: Sheila Gardner MRN: [id number]; DOB: [birth year]  Admit date: 04/30/2019 Discharge date: 05/01/2019  Primary Care Provider: Lawerance Cruel, MD  Primary Cardiologist: Sinclair Grooms, MD   Discharge Diagnoses    Principal Problem:   CAD (coronary artery disease), native coronary artery Active Problems:   Hyperlipidemia LDL goal <70   Hypertension   CAD (coronary artery disease)   PAF  Sinus bradycardia   Diagnostic Studies/Procedures    CORONARY STENT INTERVENTION  CORONARY ATHERECTOMY  LEFT HEART CATH AND CORONARY ANGIOGRAPHY  Conclusion   Orbital atherectomy and stent implantation proximal to mid LAD reducing 50%, 80%, and 70% stenosis to 0% using a single 38 x 2.75 Onyx DES postdilated to 3.25 mmHg in diameter.  LAD contains eccentric mid 8 to 60% stenosis  60% mid circumflex Medina 010 bifurcation stenosis beyond the large second obtuse marginal.  Codominant right coronary without obstructive disease  Normal LV function and LVEDP  RECOMMENDATIONS:   Resume Eliquis this p.m.  Brilinta 90 mg twice daily, aspirin 81 mg daily, and Eliquis combination x30 days then drop aspirin.  Aggressive risk factor modification  Aggrastat infusion x2 to 3 hours post procedure   Diagnostic Dominance: Right  Intervention        History of Present Illness     Sheila Gardner is a 84 y.o. female with hx of paroxysmal atrial fibrillation status post cardioversion, sinus bradycardia, right bundle branch block, chronic anticoagulation, hyperlipidemia, hypertension  Presents for cath following abnormal ct coronary.    1. Left Main: 0.99; No significant stenosis.  2. Proximal LAD: <0.50; significant stenosis. 3. LCX: 0.92; no significant stenosis. 4. RCA: 0.93; no significant stenosis and nondominant.  IMPRESSION: 1. Proximal LAD is positive by CT-FFR. Would consider cardiac cath  Hospital Course     Consultants:  None  1. CAD -Cath as above.  S/p rrbital atherectomy and stent implantation proximal to mid LAD reducing 50%, 80%, and 70% stenosis to 0% using a single 38 x 2.75 Onyx DES postdilated to 3.25 mmHg in diameter. Medical therapy for other disease.  Postprocedure patient was not given Plavix dose.  Now she is placed on triple therapy with aspirin, Brilinta and Eliquis. Stop ASA in 1 month.  Reviewed with Dr. [name] given long stent in LAD,   she will continue Brilinta however reduce Eliquis to 2.5 mg twice daily until we will stop aspirin in 1 month.  Then continue Brilinta 90 mg twice daily and Eliquis 5 mg twice daily.  -No beta-blocker given baseline bradycardia -Changed pravastatin to high intensity statin  2. HLD - LDL 186 few months back -Change pravastatin to Crestor 40 mg daily -Lipid panel and LFTs in 6 to 8 weeks  3.  Sinus bradycardia -Her baseline heart rate in 40s with intermittent to 50s -She is symptomatic with fatigue with ambulation during cardiac rehab.  This appears is chronic.  No syncope. -At night her heart rate drops to mid 30s -She has sleep study scheduled but unable to go>>  needs to reschedule as outpatient -Most recent monitor 03/2019 as below , may need pacemaker at some point   Basic rhythm is sinus rhythm with occasional profound sinus bradycardia during sleep, less than 35 bpm.  Occasional PACs and PVCs are noted.  Nonsustained SVT less than 10 beats, is very rare. No ventricular or atrial sustained dysrhythmia.  Did the patient have an acute coronary syndrome (MI, NSTEMI, STEMI, etc) this admission?:  No                               Did the patient have a percutaneous coronary intervention (stent / angioplasty)?:  Yes.     Cath/PCI Registry Performance & Quality Measures: 5. Aspirin prescribed? - Yes 6. ADP Receptor Inhibitor (Plavix/Clopidogrel, Brilinta/Ticagrelor or Effient/Prasugrel) prescribed (includes medically managed patients)? - Yes 7. High  Intensity Statin (Lipitor 40-80mg  or Crestor 20-40mg ) prescribed? - Yes 8. For EF <40%, was ACEI/ARB prescribed? - Not Applicable (EF >/= [id number]) 9. For EF <40%, Aldosterone Antagonist (Spironolactone or Eplerenone) prescribed? - Not Applicable (EF >/= [id number]) 10. Cardiac Rehab Phase II ordered (Included Medically managed Patients)? - Yes   Discharge Vitals Blood pressure (!) 146/45, pulse (!) 45, temperature 97.6 F (36.4 C), temperature source Oral, resp. rate 15, height 5' 4.5" (1.638 m), weight 81.4 kg, SpO2 98 %.  Filed Weights   04/30/19 0548 05/01/19 0500  Weight: 81.6 kg 81.4 kg   Physical exam GEN:No acute distress.   Neck:No JVD Cardiac: Regular slow rhythm, systolic murmurs, rubs, or gallops.  R radial cath site stable Respiratory:Clear to auscultation bilaterally. [id number], nontender, non-distended  MS:No edema; No deformity. Neuro:Nonfocal  Psych: Normal affect   Labs & Radiologic Studies    CBC Recent Labs    04/30/19 1227 05/01/19 0318  WBC 7.1 7.9  HGB 11.7* 11.2*  HCT 35.0* 33.4*  MCV 92.8 93.0  PLT 289 [id number]   Basic Metabolic Panel Recent Labs    04/30/19 1227 05/01/19 0318  NA  --  137  K  --  3.7  CL  --  102  CO2  --  26  GLUCOSE  --  116*  BUN  --  11  CREATININE 0.74 0.61  CALCIUM  --  9.3  _____________  CARDIAC CATHETERIZATION  Result Date: 04/30/2019  Orbital atherectomy and stent implantation proximal to mid LAD reducing 50%, 80%, and 70% stenosis to 0% using a single 38 x 2.75 Onyx DES postdilated to 3.25 mmHg in diameter.  LAD contains eccentric mid 56 to 60% stenosis  60% mid circumflex Medina 010 bifurcation stenosis beyond the large second obtuse marginal.  Codominant right coronary without obstructive disease  Normal LV function and LVEDP RECOMMENDATIONS:  Resume Eliquis this p.m.  Brilinta 90 mg twice daily, aspirin 81 mg daily, and Eliquis combination x30 days then drop aspirin.  Aggressive risk factor modification   Aggrastat infusion x2 to 3 hours post procedure  CT CORONARY MORPH W/CTA COR W/SCORE W/CA W/CM &/OR WO/CM  Addendum Date: 04/23/2019   ADDENDUM REPORT: 04/23/2019 20:13 CLINICAL DATA:  Chest pain EXAM: Cardiac/Coronary CTA TECHNIQUE: The patient was scanned on a [employer]. A 100 kV prospective scan was triggered in the descending thoracic aorta at 111 HU's. Axial non-contrast 3 mm slices were carried out through the heart. The data set was analyzed on a dedicated work station and scored using the Aristes. Gantry rotation speed was 250 msecs and collimation was .6 mm. No beta blockade and 0.8 mg of sl NTG was given. The 3D data set was reconstructed in 5% intervals of the 35-75 % of the R-R cycle. Diastolic phases were analyzed on a dedicated work station using MPR, MIP and VRT modes. The patient received 80 cc of contrast. FINDINGS: Image quality: Excellent.  Calcium score not performed. Noise artifact is: Limited. Coronary Arteries:  Normal coronary origin.  Left dominance. Left main:  The left main is a large caliber vessel with a normal take off from the left coronary cusp that bifurcates to form a left anterior descending artery and a left circumflex artery. There is mild calcified plaque (25-49%) in the distal left main. Left anterior descending artery: The ostial LAD contains mild calcified plaque (25-49%). The proximal LAD contains mixed, density plaque that is at least moderate in severity (50-69%). The mid LAD contains mixed density plaque that is at least moderate (50-69%), likely severe (70-99%). The distal LAD is diffusely diseased with mild calcified plaque (25-49%). There are 2 diagonal branches with mild calcified plaque (25-49%). Left circumflex artery: The LCX is dominant. The proximal LCX contains minimal calcified plaque (<25%). The mid and distal LCX are patent without stenosis. The LCX gives off 1 large OM branch with mild mixed density plaque (25-49%). The LCX terminates  as a patent PDA Right coronary artery: The RCA is non-dominant with normal take off from the right coronary cusp. The proximal RCA contains mild, calcified plaque (25-49%). Right Atrium: Right atrial size is within normal limits. Right Ventricle: The right ventricular cavity is within normal limits. Left Atrium: Left atrial size is normal in size with no left atrial appendage filling defect. There appears to be a diverticulum of the superior aspect of the left atrium. This does not appear to be a fistulous connection or PFO. Left Ventricle: The ventricular cavity size is within normal limits. There are no stigmata of prior infarction. There is no abnormal filling defect. Pulmonary arteries: Normal in size without proximal filling defect. Pulmonary veins: Normal pulmonary venous drainage. Pericardium: Normal thickness with no significant effusion or calcium present. Cardiac valves: The aortic valve is trileaflet with mild calcifications. The mitral valve is normal structure with mild annular calcification. Aorta: Normal caliber with no significant disease. Extra-cardiac findings: See attached radiology report for non-cardiac structures. IMPRESSION: 1. Normal coronary origin with left dominance. 2. At least moderate (50-69%), mixed density plaque in the proximal and mid LAD. Unable to exclude severe stenosis. RECOMMENDATIONS: 1. CT-FFR will be submitted for the LAD, but due to heavy calcification, this may be inconclusive. Would consider symptom-guided anti-ischemic pharmacotherapy as well as risk factor modification per guideline directed care. If CT FFR is inconclusive would consider nuclear medicine stress test vs cardiac cath for clarification. Eleonore Chiquito, MD Electronically Signed   By: Eleonore Chiquito   On: 04/23/2019 20:13   Result Date: 04/23/2019 EXAM: OVER-READ INTERPRETATION  CT CHEST The following report is an over-read performed by radiologist Dr. Abigail Miyamoto of [facility], [place] on 04/23/2019.  This over-read does not include interpretation of cardiac or coronary anatomy or pathology. The coronary CTA interpretation by the cardiologist is attached. COMPARISON:  Chest radiograph 10/05/2018.  Chest CT 02/25/2016 FINDINGS: Vascular: Aortic atherosclerosis. Tortuous thoracic aorta. No aneurysm or dissection. No central pulmonary embolism, on this non-dedicated study. Mediastinum/Nodes: No imaged thoracic adenopathy. Lungs/Pleura: No pleural fluid.  Clear imaged lungs. Upper Abdomen: Normal imaged portions of the liver, spleen, stomach. Musculoskeletal: No acute osseous abnormality. Midthoracic spondylosis. IMPRESSION: 1.  No acute findings in the imaged extracardiac chest. 2.  Aortic Atherosclerosis (ICD10-I70.0). Electronically Signed: By: Abigail Miyamoto M.D. On: 04/23/2019 13:03   CT CORONARY FRACTIONAL FLOW RESERVE DATA PREP  Result Date: 04/23/2019 EXAM: CT FFR ANALYSIS CLINICAL DATA:  Chest pain FINDINGS: FFRct analysis was performed on the original cardiac CT angiogram dataset. Diagrammatic representation of the FFRct analysis is provided in a separate PDF document in PACS. This dictation  was created using the PDF document and an interactive 3D model of the results. 3D model is not available in the EMR/PACS. Normal FFR range is >0.80. 1. Left Main: 0.99; No significant stenosis. 2. Proximal LAD: <0.50; significant stenosis. 3. LCX: 0.92; no significant stenosis. 4. RCA: 0.93; no significant stenosis and nondominant. IMPRESSION: 1. Proximal LAD is positive by CT-FFR. Would consider cardiac cath. Eleonore Chiquito, MD Electronically Signed   By: Eleonore Chiquito   On: 04/23/2019 20:16   LONG TERM MONITOR (3-14 DAYS)  Result Date: 04/06/2019  Basic rhythm is sinus rhythm with occasional profound sinus bradycardia during sleep, less than 35 bpm.  Occasional PACs and PVCs are noted.  Nonsustained SVT less than 10 beats, is very rare.  No ventricular or atrial sustained dysrhythmia.    Disposition   Pt  is being discharged home today in good condition.  Follow-up Plans & Appointments    Follow-up Information    [facility], [place], [place]. Go on 05/11/2019.   Specialty: Cardiology Why: @10 :45am for follow up  Contact information: [place] [address] [phone]          Discharge Instructions    Amb Referral to Cardiac Rehabilitation   Complete by: As directed    Diagnosis: Coronary Stents   After initial evaluation and assessments completed: Virtual Based Care may be provided alone or in conjunction with Phase 2 Cardiac Rehab based on patient barriers.: Yes   Diet - low sodium heart healthy   Complete by: As directed    Discharge instructions   Complete by: As directed    No driving for 48 hours. No lifting over 5 lbs for 1 week. No sexual activity for 1 week. Keep procedure site clean & dry. If you notice increased pain, swelling, bleeding or pus, call/return!  You may shower, but no soaking baths/hot tubs/pools for 1 week.   Increase activity slowly   Complete by: As directed       Discharge Medications   Allergies as of 05/01/2019   No Known Allergies     Medication List    STOP taking these medications   metoprolol tartrate 50 MG tablet Commonly known as: LOPRESSOR   pravastatin 20 MG tablet Commonly known as: PRAVACHOL     TAKE these medications   acetaminophen 650 MG CR tablet Commonly known as: TYLENOL Take 1,300 mg by mouth every 8 (eight) hours as needed for pain. What changed: Another medication with the same name was removed. Continue taking this medication, and follow the directions you see here.   apixaban 2.5 MG Tabs tablet Commonly known as: ELIQUIS Take 1 tablet (2.5 mg total) by mouth 2 (two) times daily.   aspirin EC 81 MG tablet Take 1 tablet (81 mg total) by mouth daily.   BIOTIN PO Take 10,000 Units by mouth daily with breakfast.   cetirizine 10 MG tablet Commonly known as: ZYRTEC Take 10 mg by mouth daily  as needed for allergies.   [employer] Triple Magnesium Complex 400 MG Caps Generic drug: Magnesium Take 400 mg by mouth daily.   cyanocobalamin 1000 MCG tablet Take 1,000 mcg by mouth daily.   esomeprazole 20 MG capsule Commonly known as: NEXIUM Take 20 mg by mouth daily at 12 noon.   ferrous sulfate 325 (65 FE) MG tablet Take 325 mg by mouth daily with breakfast.   hydrochlorothiazide 25 MG tablet Commonly known as: HYDRODIURIL Take 25 mg by mouth as needed (fluid).   LORazepam  1 MG tablet Commonly known as: ATIVAN Take 1 mg by mouth at bedtime.   losartan 100 MG tablet Commonly known as: COZAAR Take 100 mg by mouth daily.   oxybutynin 5 MG tablet Commonly known as: DITROPAN Take 5 mg by mouth as needed for bladder spasms.   potassium chloride 10 MEQ tablet Commonly known as: KLOR-CON Take 20 mEq by mouth daily.   prednisoLONE acetate 1 % ophthalmic suspension Commonly known as: PRED FORTE Place 1 drop into both eyes daily as needed (Burning).   pyridOXINE 100 MG tablet Commonly known as: VITAMIN B-6 Take 100 mg by mouth daily.   rosuvastatin 40 MG tablet Commonly known as: CRESTOR Take 1 tablet (40 mg total) by mouth daily at 6 PM.   ticagrelor 90 MG Tabs tablet Commonly known as: BRILINTA Take 1 tablet (90 mg total) by mouth 2 (two) times daily.   vitamin C 1000 MG tablet Take 1,000 mg by mouth daily.   Vitamin D-3 25 MCG (1000 UT) Caps Take 1,000 Units by mouth daily with breakfast.   vitamin E 180 MG (400 UNITS) capsule Take 400 Units by mouth daily.          Outstanding Labs/Studies   Sleep study as outpatient CBC  Duration of Discharge Encounter   Greater than 30 minutes including physician time.  Jarrett Soho, PA 05/01/2019, 11:09 AM

## 2019-05-01 NOTE — Progress Notes (Addendum)
See discharge summary

## 2019-05-04 ENCOUNTER — Telehealth: Payer: Self-pay | Admitting: Interventional Cardiology

## 2019-05-04 NOTE — Telephone Encounter (Signed)
Spoke with pt and advised ok to remove bandage from wrist.  She states the nurse at the hospital was adamant that she had to keep it off for 7 days.  Reassured her ok to remove.  Pt still nervous.  Advised I would ask an interventionalist that is here in the office.  Spoke with Dr. Angelena Form and he said ok to remove.  Made pt aware.  Pt also inquired about getting an injection in her hip because it is sore and bruised.  Advised I am unable to see if one was given but there is a possibility she received a heparin injection.  Advised ok to take Tylenol for hip pain.  Pt verbalized understanding and was appreciative for call.

## 2019-05-04 NOTE — Telephone Encounter (Signed)
Rick is calling wanting to know if she needs to keep the bandage on her wrist from her cath on until her appointment scheduled for 05/11/19 or if she should take it off before then. She would also like to know if she got a shot in her hip at anytime during, after, or before the surgery due to it hurting. She is requesting Maude Leriche calls her back in regards to this. Please advise.

## 2019-05-07 ENCOUNTER — Ambulatory Visit: Payer: Medicare Other

## 2019-05-07 ENCOUNTER — Encounter (HOSPITAL_COMMUNITY): Payer: Self-pay | Admitting: *Deleted

## 2019-05-07 ENCOUNTER — Telehealth (HOSPITAL_COMMUNITY): Payer: Self-pay

## 2019-05-07 NOTE — Progress Notes (Signed)
Referral received and verified for MD signature. Follow up appt is 05/11/19. Insurance benefits and eligibility to be determined upon the satisfactory completion of the follow up appt. Cherre Huger, BSN Cardiac and Training and development officer

## 2019-05-07 NOTE — Telephone Encounter (Signed)
Pt insurance is active and benefits verified through Medicare a/b Co-pay 0, DED $203/$203 met, out of pocket 0/0 met, co-insurance 20%. no pre-authorization required. Passport, 05/07/2019@3:48pm, REF# [id number]  Will contact patient to see if she is interested in the Cardiac Rehab Program. If interested, patient will need to complete follow up appt. Once completed, patient will be contacted for scheduling upon review by the RN Navigator. 

## 2019-05-08 ENCOUNTER — Telehealth: Payer: Self-pay | Admitting: Interventional Cardiology

## 2019-05-08 DIAGNOSIS — I10 Essential (primary) hypertension: Secondary | ICD-10-CM

## 2019-05-08 NOTE — Telephone Encounter (Signed)
Pt states she did not feel well yesterday or this morning.  Felt like she just wanted to lay down and not do anything.  Checked her BP at 11:43A and it was 166/67, HR 43.  Checked it a little later and it was 189/76, HR 47.  Denies swelling, increased salt in diet, HA or blurred vision.  Takes Losartan 100mg  every morning. Advised I will send to Dr. [name] for review.

## 2019-05-08 NOTE — Telephone Encounter (Signed)
New message   Pt c/o BP issue: STAT if pt c/o blurred vision, one-sided weakness or slurred speech  1. What are your last 5 BP readings? 174/93 46 hr 189/76 hr 47   2. Are you having any other symptoms (ex. Dizziness, headache, blurred vision, passed out)? No   3. What is your BP issue? Patient's b/p is elevated

## 2019-05-09 MED ORDER — HYDROCHLOROTHIAZIDE 25 MG PO TABS: 12.5000 mg | ORAL_TABLET | Freq: Every day | ORAL | 3 refills | Status: DC

## 2019-05-09 NOTE — Addendum Note (Signed)
Addended by: Loren Racer on: 05/09/2019 04:57 PM   Modules accepted: Orders

## 2019-05-09 NOTE — Telephone Encounter (Signed)
Follow Up  Patient returning call. Please give patient a call back.  

## 2019-05-09 NOTE — Telephone Encounter (Signed)
Spoke with pt and went over recommendations.  Pt will come to the office next Thursday for labs.  Pt verbalized understanding and was appreciative for call.

## 2019-05-09 NOTE — Telephone Encounter (Signed)
Left message to call back  

## 2019-05-09 NOTE — Telephone Encounter (Signed)
Change the HCTZ to 12.5 mg daily.  She should have a basic metabolic panel performed 7 to 10 days after the changes made.

## 2019-05-09 NOTE — Progress Notes (Signed)
Cardiology Office Note    Date:  05/11/2019   ID:  Sheila Gardner, DOB [birth year], MRN [id number]  PCP:  Lawerance Cruel, MD  Cardiologist:  Dr. [name]   Chief Complaint: Hospital follow up s/p   Cath   History of Present Illness:   Sheila Gardner is a 84 y.o. female with hx of CAD,  paroxysmal atrial fibrillation status post cardioversion, sinus bradycardia, right bundle branch block, chronic anticoagulation, hyperlipidemia, hypertensionpresents for follow up.   Most recently patient has positive CT-FFR for proximal LAD. Cath as below. .S/porbital atherectomy and stent implantation proximal to mid LAD reducing 50%, 80%, and 70% stenosis to 0% using a single 38 x 2.75 Onyx DES postdilated to 3.25 mmHg in diameter.Medical therapy for other disease.she is placed on triple therapy with aspirin, Brilinta and Eliquis. Stop ASA in 1 month.  Reviewed with Dr. [name] given long stent in LAD,   she will continue Brilinta however reduce Eliquis to 2.5 mg twice daily until we will stop aspirin in 1 month.  Then continue Brilinta 90 mg twice daily and Eliquis 5 mg twice daily. No beta-blocker given baseline bradycardia. Changed pravastatin to high intensity statin. Pending sleep study.   Added HCTZ post discharge due to elevated BP.   Here today for follow up.  Patient intermittently feels dizziness and shortness of breath.  No associated chest discomfort.  Heart rate in 40s sometimes.  Seems she is going to tachybradycardia syndromes.  Blood pressure in 160/90s.  Anxious at baseline.  Denies syncope or lower extremity edema.   Past Medical History:  Diagnosis Date  . Arthritis   . Breast cancer ([place])   . Cancer ([place])    Breast- rt  . Cough with sputum 2016  . Elevated liver function tests 05/09/2013  . GERD (gastroesophageal reflux disease)    COSTOCHONDRITIS  . Heart murmur   . Hip pain 05/08/2014  . Hyperlipidemia   . Hypertension   . Lipoma    back of neck  . PONV (postoperative  nausea and vomiting)    only after rt hip surgery  . Primary osteoarthritis of left knee 11/04/2015  . Primary osteoarthritis of right hip 07/30/2014    Past Surgical History:  Procedure Laterality Date  . ANKLE FRACTURE SURGERY Right 2013   RIGHT   . BREAST LUMPECTOMY Right 2011  . BREAST SURGERY  04/22/2009   Rt lumpectomy  . CARDIOVERSION N/A 02/19/2019   Procedure: CARDIOVERSION;  Surgeon: Dorothy Spark, MD;  Location: [facility] ENDOSCOPY;  Service: Cardiovascular;  Laterality: N/A;  . CORONARY ATHERECTOMY N/A 04/30/2019   Procedure: CORONARY ATHERECTOMY;  Surgeon: Belva Crome, MD;  Location: [place] CV LAB;  Service: Cardiovascular;  Laterality: N/A;  LAD  . CORONARY STENT INTERVENTION N/A 04/30/2019   Procedure: CORONARY STENT INTERVENTION;  Surgeon: Belva Crome, MD;  Location: [place] CV LAB;  Service: Cardiovascular;  Laterality: N/A;  DES TO PROX-MID LAD  . EYE SURGERY  2001   macular hole  . JOINT REPLACEMENT  2016  . LEFT HEART CATH AND CORONARY ANGIOGRAPHY N/A 04/30/2019   Procedure: LEFT HEART CATH AND CORONARY ANGIOGRAPHY;  Surgeon: Belva Crome, MD;  Location: [place] CV LAB;  Service: Cardiovascular;  Laterality: N/A;  . MIDDLE EAR SURGERY Left 08/18/2015   repair eardrum and reconstruction  . OVARY SURGERY  40 years ago   wedge  . TOTAL HIP ARTHROPLASTY Right 07/30/2014   Procedure: TOTAL HIP ARTHROPLASTY ANTERIOR APPROACH;  Surgeon: Melrose Nakayama, MD;  Location: [place];  Service: Orthopedics;  Laterality: Right;  . TOTAL KNEE ARTHROPLASTY Left 11/04/2015  . TOTAL KNEE ARTHROPLASTY Left 11/04/2015   Procedure: TOTAL KNEE ARTHROPLASTY;  Surgeon: Melrose Nakayama, MD;  Location: [place];  Service: Orthopedics;  Laterality: Left;  . TYMPANOPLASTY  30 years ago    Current Medications: Prior to Admission medications   Medication Sig Start Date End Date Taking? Authorizing Provider  acetaminophen (TYLENOL) 650 MG CR tablet Take 1,300 mg by mouth every 8  (eight) hours as needed for pain.    [provider]  apixaban (ELIQUIS) 2.5 MG TABS tablet Take 1 tablet (2.5 mg total) by mouth 2 (two) times daily. 05/01/19   Leanor Kail, PA  Ascorbic Acid (VITAMIN C) 1000 MG tablet Take 1,000 mg by mouth daily.     [provider]  aspirin EC 81 MG tablet Take 1 tablet (81 mg total) by mouth daily. 05/01/19 05/27/20  Yoselin Amerman, Crista Luria, PA  BIOTIN PO Take 10,000 Units by mouth daily with breakfast.     [provider]  cetirizine (ZYRTEC) 10 MG tablet Take 10 mg by mouth daily as needed for allergies.    [provider]  Cholecalciferol (VITAMIN D-3) 1000 UNITS CAPS Take 1,000 Units by mouth daily with breakfast.     [provider]  cyanocobalamin 1000 MCG tablet Take 1,000 mcg by mouth daily.     [provider]  esomeprazole (NEXIUM) 20 MG capsule Take 20 mg by mouth daily at 12 noon.    [provider]  ferrous sulfate 325 (65 FE) MG tablet Take 325 mg by mouth daily with breakfast.    [provider]  hydrochlorothiazide (HYDRODIURIL) 25 MG tablet Take 0.5 tablets (12.5 mg total) by mouth daily. 05/09/19   Belva Crome, MD  LORazepam (ATIVAN) 1 MG tablet Take 1 mg by mouth at bedtime.     [provider]  losartan (COZAAR) 100 MG tablet Take 100 mg by mouth daily.     [provider]  Magnesium ([employer] TRIPLE MAGNESIUM COMPLEX) 400 MG CAPS Take 400 mg by mouth daily.    [provider]  oxybutynin (DITROPAN) 5 MG tablet Take 5 mg by mouth as needed for bladder spasms.  12/03/18   [provider]  potassium chloride (K-DUR) 10 MEQ tablet Take 20 mEq by mouth daily.     [provider]  prednisoLONE acetate (PRED FORTE) 1 % ophthalmic suspension Place 1 drop into both eyes daily as needed (Burning).  01/16/19   [provider]  pyridOXINE (VITAMIN B-6) 100 MG tablet Take 100 mg by mouth daily.    [provider]   rosuvastatin (CRESTOR) 40 MG tablet Take 1 tablet (40 mg total) by mouth daily at 6 PM. 05/01/19   Jak Haggar, PA  ticagrelor (BRILINTA) 90 MG TABS tablet Take 1 tablet (90 mg total) by mouth 2 (two) times daily. 05/01/19   Leanor Kail, PA  vitamin E 400 UNIT capsule Take 400 Units by mouth daily.    [provider]    Allergies:   Patient has no known allergies.   Social History   Socioeconomic History  . Marital status: Widowed    Spouse name: Not on file  . Number of children: Not on file  . Years of education: Not on file  . Highest education level: Not on file  Occupational History  . Not on file  Tobacco Use  .  Smoking status: Never Smoker  . Smokeless tobacco: Never Used  Substance and Sexual Activity  . Alcohol use: Not Currently    Comment: OCC -none now  . Drug use: No  . Sexual activity: Not on file  Other Topics Concern  . Not on file  Social History Narrative  . Not on file   Social Determinants of Health   Financial Resource Strain:   . Difficulty of Paying Living Expenses: Not on file  Food Insecurity:   . Worried About Charity fundraiser in the Last Year: Not on file  . Ran Out of Food in the Last Year: Not on file  Transportation Needs:   . Lack of Transportation (Medical): Not on file  . Lack of Transportation (Non-Medical): Not on file  Physical Activity:   . Days of Exercise per Week: Not on file  . Minutes of Exercise per Session: Not on file  Stress:   . Feeling of Stress : Not on file  Social Connections:   . Frequency of Communication with Friends and Family: Not on file  . Frequency of Social Gatherings with Friends and Family: Not on file  . Attends Religious Services: Not on file  . Active Member of Clubs or Organizations: Not on file  . Attends Archivist Meetings: Not on file  . Marital Status: Not on file     Family History:  The patient's family history includes Cancer in her brother; Heart  disease in her mother.   ROS:   Please see the history of present illness.    ROS All other systems reviewed and are negative.   PHYSICAL EXAM:   VS:  BP (!) 164/70   Pulse (!) 53   Ht 5' 4.5" (1.638 m)   Wt 183 lb (83 kg)   SpO2 97%   BMI 30.93 kg/m    GEN: Well nourished, well developed, in no acute distress  HEENT: normal  Neck: no JVD, carotid bruits, or masses Cardiac: RRR; no murmurs, rubs, or gallops,no edema  Respiratory:  clear to auscultation bilaterally, normal work of breathing GI: soft, nontender, nondistended, + BS MS: no deformity or atrophy  Skin: warm and dry, no rash Neuro:  Alert and Oriented x 3, Strength and sensation are intact Psych: euthymic mood, full affect  Wt Readings from Last 3 Encounters:  05/11/19 183 lb (83 kg)  05/01/19 179 lb 8 oz (81.4 kg)  04/27/19 185 lb (83.9 kg)      Studies/Labs Reviewed:   EKG:  EKG is not ordered today.   Recent Labs: 03/25/2019: ALT 19 05/01/2019: BUN 11; Creatinine, Ser 0.61; Hemoglobin 11.2; Platelets 279; Potassium 3.7; Sodium 137   Lipid Panel No results found for: CHOL, TRIG, HDL, CHOLHDL, VLDL, LDLCALC, LDLDIRECT  Additional studies/ records that were reviewed today include:   CORONARY STENT INTERVENTION  CORONARY ATHERECTOMY  LEFT HEART CATH AND CORONARY ANGIOGRAPHY  Conclusion   Orbital atherectomy and stent implantation proximal to mid LAD reducing 50%, 80%, and 70% stenosis to 0% using a single 38 x 2.75 Onyx DES postdilated to 3.25 mmHg in diameter.  LAD contains eccentric mid 13 to 60% stenosis  60% mid circumflex Medina 010 bifurcation stenosis beyond the large second obtuse marginal.  Codominant right coronary without obstructive disease  Normal LV function and LVEDP  RECOMMENDATIONS:   Resume Eliquis this p.m.  Brilinta 90 mg twice daily, aspirin 81 mg daily, and Eliquis combination x30 days then drop aspirin.  Aggressive risk factor  modification  Aggrastat infusion x2  to 3 hours post procedure   Diagnostic Dominance: Right  Intervention          ASSESSMENT & PLAN:     1. CAD -S/p0rbital atherectomy and stent implantation proximal to mid LAD reducing 50%, 80%, and 70% stenosis to 0% using a single 38 x 2.75 Onyx DES postdilated to 3.25 mmHg in diameter.Medical therapy for other disease. -On March 23, she will stop aspirin and increase Eliquis to 5 mg twice daily. Continue Brililnta.  ESR  2. HLD - LDL 186 few months back -Change pravastatin to Crestor 40 mg daily -Lipid panel and LFTs at follow up  3.Sinus bradycardia/ PAF -Most recent monitor result as above.  She has pending sleep study. -Reviewed tachybradycardia syndrome in detail.  Education given for greater than 30 minutes. -Will await until next office visit for EP referral -Patient and daughter is in agreement  4.  Hypertension -Initial blood pressure was elevated however on repeat check by myself it was improved to 134/70. -No change made today. -Patient will bring her blood pressure cuff during the BMET next week   Medication Adjustments/Labs and Tests Ordered: Current medicines are reviewed at length with the patient today.  Concerns regarding medicines are outlined above.  Medication changes, Labs and Tests ordered today are listed in the Patient Instructions below. Patient Instructions  Medication Instructions:   Your physician recommends that you continue on your current medications as directed. Please refer to the Current Medication list given to you today.   *If you need a refill on your cardiac medications before your next appointment, please call your pharmacy*   Lab Work: [place]   If you have labs (blood work) drawn today and your tests are completely normal, you will receive your results only by: Marland Kitchen MyChart Message (if you have MyChart) OR . A paper copy in the mail If you have any lab test that is abnormal or we need to change  your treatment, we will call you to review the results.   Testing/Procedures: NONE ORDERED  TODAY  Follow-Up:  At [facility], you and your health needs are our priority.  As part of our continuing mission to provide you with exceptional heart care, we have created designated Provider Care Teams.  These Care Teams include your primary Cardiologist (physician) and Advanced Practice Providers (APPs -  Physician Assistants and Nurse Practitioners) who all work together to provide you with the care you need, when you need it.  We recommend signing up for the patient portal called "MyChart".  Sign up information is provided on this After Visit Summary.  MyChart is used to connect with patients for Virtual Visits (Telemedicine).  Patients are able to view lab/test results, encounter notes, upcoming appointments, etc.  Non-urgent messages can be sent to your provider as well.   To learn more about what you can do with MyChart, go to [email or web address].    Your next appointment:    Next Week Nurse Visit with Lab work   The format for your next appointment:   In Person  Provider:   Nurse Visit   Other Instructions      Signed, Leanor Kail, [place]  05/11/2019 1:00 PM    [address], [place], [place]  [address] Phone: [phone]; Fax: [phone]

## 2019-05-11 ENCOUNTER — Other Ambulatory Visit: Payer: Self-pay

## 2019-05-11 ENCOUNTER — Ambulatory Visit (INDEPENDENT_AMBULATORY_CARE_PROVIDER_SITE_OTHER): Payer: Medicare Other | Admitting: Physician Assistant

## 2019-05-11 VITALS — BP 164/70 | HR 53 | Ht 64.5 in | Wt 183.0 lb

## 2019-05-11 DIAGNOSIS — R001 Bradycardia, unspecified: Secondary | ICD-10-CM | POA: Diagnosis not present

## 2019-05-11 DIAGNOSIS — I2584 Coronary atherosclerosis due to calcified coronary lesion: Secondary | ICD-10-CM | POA: Diagnosis not present

## 2019-05-11 DIAGNOSIS — E7800 Pure hypercholesterolemia, unspecified: Secondary | ICD-10-CM

## 2019-05-11 DIAGNOSIS — I25118 Atherosclerotic heart disease of native coronary artery with other forms of angina pectoris: Secondary | ICD-10-CM

## 2019-05-11 DIAGNOSIS — I2510 Atherosclerotic heart disease of native coronary artery without angina pectoris: Secondary | ICD-10-CM | POA: Diagnosis not present

## 2019-05-11 DIAGNOSIS — I10 Essential (primary) hypertension: Secondary | ICD-10-CM

## 2019-05-11 DIAGNOSIS — I4510 Unspecified right bundle-branch block: Secondary | ICD-10-CM

## 2019-05-11 DIAGNOSIS — I480 Paroxysmal atrial fibrillation: Secondary | ICD-10-CM | POA: Diagnosis not present

## 2019-05-11 NOTE — Patient Instructions (Signed)
Medication Instructions:   Your physician recommends that you continue on your current medications as directed. Please refer to the Current Medication list given to you today.   *If you need a refill on your cardiac medications before your next appointment, please call your pharmacy*   Lab Work: [place]   If you have labs (blood work) drawn today and your tests are completely normal, you will receive your results only by: Marland Kitchen MyChart Message (if you have MyChart) OR . A paper copy in the mail If you have any lab test that is abnormal or we need to change your treatment, we will call you to review the results.   Testing/Procedures: NONE ORDERED  TODAY  Follow-Up:  At [facility], you and your health needs are our priority.  As part of our continuing mission to provide you with exceptional heart care, we have created designated Provider Care Teams.  These Care Teams include your primary Cardiologist (physician) and Advanced Practice Providers (APPs -  Physician Assistants and Nurse Practitioners) who all work together to provide you with the care you need, when you need it.  We recommend signing up for the patient portal called "MyChart".  Sign up information is provided on this After Visit Summary.  MyChart is used to connect with patients for Virtual Visits (Telemedicine).  Patients are able to view lab/test results, encounter notes, upcoming appointments, etc.  Non-urgent messages can be sent to your provider as well.   To learn more about what you can do with MyChart, go to [email or web address].    Your next appointment:    Next Week Nurse Visit with Lab work   The format for your next appointment:   In Person  Provider:   Nurse Visit   Other Instructions

## 2019-05-16 NOTE — Telephone Encounter (Signed)
Patient called to reschedule her sleep study today. Patient is scheduled for lab study on 05/31/19. Pt is scheduled for COVID screening on 05/29/19 10 am prior to SS.  Patient understands her sleep study will be done at [facility] sleep lab. Patient understands she will receive a sleep packet in a week or so. Patient understands to call if she does not receive the sleep packet in a timely manner. Patient agrees with treatment and thanked me for call.

## 2019-05-17 ENCOUNTER — Other Ambulatory Visit: Payer: Self-pay

## 2019-05-17 ENCOUNTER — Ambulatory Visit (INDEPENDENT_AMBULATORY_CARE_PROVIDER_SITE_OTHER): Payer: Medicare Other

## 2019-05-17 ENCOUNTER — Other Ambulatory Visit: Payer: Medicare Other

## 2019-05-17 VITALS — BP 130/62 | HR 52 | Ht 64.5 in | Wt 183.2 lb

## 2019-05-17 DIAGNOSIS — I10 Essential (primary) hypertension: Secondary | ICD-10-CM | POA: Diagnosis not present

## 2019-05-17 LAB — BASIC METABOLIC PANEL
BUN/Creatinine Ratio: 13 (ref 12–28)
BUN: 8 mg/dL (ref 8–27)
CO2: 22 mmol/L (ref 20–29)
Calcium: 9.4 mg/dL (ref 8.7–10.3)
Chloride: 92 mmol/L — ABNORMAL LOW (ref 96–106)
Creatinine, Ser: 0.62 mg/dL (ref 0.57–1.00)
GFR calc Af Amer: 98 mL/min/{1.73_m2} (ref >59)
GFR calc non Af Amer: 85 mL/min/{1.73_m2} (ref >59)
Glucose: 139 mg/dL — ABNORMAL HIGH (ref 65–99)
Potassium: 3.5 mmol/L (ref 3.5–5.2)
Sodium: 131 mmol/L — ABNORMAL LOW (ref 134–144)

## 2019-05-17 NOTE — Patient Instructions (Signed)
Medication Instructions:   No changes today  *If you need a refill on your cardiac medications before your next appointment, please call your pharmacy*   Lab Work: Pt had BMET today prior to nurse visit.  If you have labs (blood work) drawn today and your tests are completely normal, you will receive your results only by: Marland Kitchen MyChart Message (if you have MyChart) OR . A paper copy in the mail If you have any lab test that is abnormal or we need to change your treatment, we will call you to review the results.   Testing/Procedures: None ordered.    Follow-Up: At [facility], you and your health needs are our priority.  As part of our continuing mission to provide you with exceptional heart care, we have created designated Provider Care Teams.  These Care Teams include your primary Cardiologist (physician) and Advanced Practice Providers (APPs -  Physician Assistants and Nurse Practitioners) who all work together to provide you with the care you need, when you need it.  We recommend signing up for the patient portal called "MyChart".  Sign up information is provided on this After Visit Summary.  MyChart is used to connect with patients for Virtual Visits (Telemedicine).  Patients are able to view lab/test results, encounter notes, upcoming appointments, etc.  Non-urgent messages can be sent to your provider as well.   To learn more about what you can do with MyChart, go to [email or web address].    Your next appointment:  Follow up as scheduled

## 2019-05-17 NOTE — Progress Notes (Signed)
Reason for visit : Check pt's B/P cuff against [facility] B/P cuff readings.  Name of provider requesting Visit: B. Baghat, PA  H&P:  PT had OV with PA 05/11/2019 for follow up and was found to be hypertensive at 164/70.  B/P rechecked with reading of 134/70.  Pa requested pt bring in her B/P cuff today.  ROS: Pt states she is doing fairly well other than she feels as if she might have some bladder symptoms.  B/P check on [facility] office cuff 130/62.  B/P on pt's cuff with reading of 135/78.  HR at 52 both manually and on pt's machine.  Spoke with B.Baghat, PA and shared B/P readings and HR.  PA states readings are close enough pt may continue monitoring at home.    Reviewed with pt most accurate B/P would be 1 hour after taking medication with both feet placed on the floor and arm resting at heart level on a table.  PT advised to continue monitoring at home.  Pt advised if she is having symptoms of a bladder infection she should contact her PCP for further evaluation.  Pt verbalizes understanding and agrees with current plan.

## 2019-05-25 ENCOUNTER — Telehealth: Payer: Self-pay | Admitting: Interventional Cardiology

## 2019-05-25 NOTE — Telephone Encounter (Signed)
New Message  We are recommending the COVID-19 vaccine to all of our patients. Cardiac medications (including blood thinners) should not deter anyone from being vaccinated and there is no need to hold any of those medications prior to vaccine administration.     Currently, there is a hotline to call (active 03/16/19) to schedule vaccination appointments as no walk-ins will be accepted.   Number: [phone].    If an appointment is not available please go to [email or web address] to sign up for notification when additional vaccine appointments are available.   If you have further questions or concerns about the vaccine process, please visit [email or web address] or contact your primary care physician.   

## 2019-05-29 ENCOUNTER — Other Ambulatory Visit (HOSPITAL_COMMUNITY)
Admission: RE | Admit: 2019-05-29 | Discharge: 2019-05-29 | Disposition: A | Discharge status: Home / Self Care | Payer: Medicare Other | Source: Ambulatory Visit | Attending: Cardiology | Admitting: Cardiology

## 2019-05-29 DIAGNOSIS — G4731 Primary central sleep apnea: Secondary | ICD-10-CM | POA: Diagnosis not present

## 2019-05-29 DIAGNOSIS — Z01812 Encounter for preprocedural laboratory examination: Secondary | ICD-10-CM | POA: Insufficient documentation

## 2019-05-29 DIAGNOSIS — G4733 Obstructive sleep apnea (adult) (pediatric): Secondary | ICD-10-CM | POA: Insufficient documentation

## 2019-05-29 DIAGNOSIS — Z20822 Contact with and (suspected) exposure to covid-19: Secondary | ICD-10-CM | POA: Insufficient documentation

## 2019-05-29 DIAGNOSIS — R0683 Snoring: Secondary | ICD-10-CM | POA: Diagnosis not present

## 2019-05-29 DIAGNOSIS — R0902 Hypoxemia: Secondary | ICD-10-CM | POA: Insufficient documentation

## 2019-05-29 LAB — SARS CORONAVIRUS 2 (TAT 6-24 HRS): SARS Coronavirus 2: NEGATIVE

## 2019-05-31 ENCOUNTER — Ambulatory Visit (HOSPITAL_BASED_OUTPATIENT_CLINIC_OR_DEPARTMENT_OTHER): Payer: Medicare Other | Admitting: Cardiology

## 2019-05-31 ENCOUNTER — Other Ambulatory Visit: Payer: Self-pay

## 2019-05-31 DIAGNOSIS — G4734 Idiopathic sleep related nonobstructive alveolar hypoventilation: Secondary | ICD-10-CM | POA: Diagnosis not present

## 2019-05-31 DIAGNOSIS — R0681 Apnea, not elsewhere classified: Secondary | ICD-10-CM

## 2019-05-31 DIAGNOSIS — R0683 Snoring: Secondary | ICD-10-CM | POA: Diagnosis not present

## 2019-05-31 DIAGNOSIS — G4733 Obstructive sleep apnea (adult) (pediatric): Secondary | ICD-10-CM | POA: Diagnosis not present

## 2019-05-31 DIAGNOSIS — R0902 Hypoxemia: Secondary | ICD-10-CM | POA: Diagnosis not present

## 2019-05-31 DIAGNOSIS — G4731 Primary central sleep apnea: Secondary | ICD-10-CM | POA: Diagnosis not present

## 2019-05-31 DIAGNOSIS — Z01812 Encounter for preprocedural laboratory examination: Secondary | ICD-10-CM | POA: Diagnosis not present

## 2019-05-31 DIAGNOSIS — Z20822 Contact with and (suspected) exposure to covid-19: Secondary | ICD-10-CM | POA: Diagnosis not present

## 2019-06-01 ENCOUNTER — Telehealth: Payer: Self-pay | Admitting: *Deleted

## 2019-06-01 ENCOUNTER — Telehealth: Payer: Self-pay | Admitting: Interventional Cardiology

## 2019-06-01 DIAGNOSIS — R0681 Apnea, not elsewhere classified: Secondary | ICD-10-CM

## 2019-06-01 NOTE — Telephone Encounter (Signed)
Pt states she has been feeling bad for 3 days.  Dizziness upon standing and SOB with exertion.  BP today just prior to calling was 94/52, HR 83.  Past two days BP has been 150-160s/60-70s with HR 80-95.  Denies CP.  Inquired about possible bleeding.  Pt states her stools are always dark because she takes iron.  Then she said they will go back to normal sometimes but then will be dark again.  Moved pt's appt up to Monday so that she could be evaluated.  Spoke with Dr. [name] and he agreed with this.  Pt verbalized understanding and was appreciative for call.

## 2019-06-01 NOTE — Procedures (Signed)
   Patient Name: Sheila Gardner, Sheila Gardner Date: 05/31/2019 Gender: Female D.O.B: 11-13-1935 Age (years): 84 Referring Provider: Sherran Needs Height (inches): 14 Interpreting Physician: Fransico Him MD, ABSM Weight (lbs): 176 RPSGT: Zadie Rhine BMI: 30 MRN: [id number] Neck Size: 15.00  CLINICAL INFORMATION Sleep Study Type: NPSG  Indication for sleep study: Hypertension, OSA, Snoring  Epworth Sleepiness Score: 1  SLEEP STUDY TECHNIQUE As per the AASM Manual for the Scoring of Sleep and Associated Events v2.3 (April 2016) with a hypopnea requiring 4% desaturations.  The channels recorded and monitored were frontal, central and occipital EEG, electrooculogram (EOG), submentalis EMG (chin), nasal and oral airflow, thoracic and abdominal wall motion, anterior tibialis EMG, snore microphone, electrocardiogram, and pulse oximetry.  MEDICATIONS Medications self-administered by patient taken the night of the study : N/A  SLEEP ARCHITECTURE The study was initiated at 10:29:41 PM and ended at 4:23:30 AM.  Sleep onset time was 103.7 minutes and the sleep efficiency was 39.3%. The total sleep time was 139 minutes.  Stage REM latency was 118.0 minutes.  The patient spent 10.1% of the night in stage N1 sleep, 56.5% in stage N2 sleep, 19.8% in stage N3 and 13.7% in REM.  Alpha intrusion was absent.  Supine sleep was 100.00%.  RESPIRATORY PARAMETERS The overall apnea/hypopnea index (AHI) was 38.8 per hour. There were 48 total apneas, including 35 obstructive, 13 central and 0 mixed apneas. There were 42 hypopneas and 36 RERAs.  The AHI during Stage REM sleep was 22.1 per hour.  AHI while supine was 38.8 per hour.  The mean oxygen saturation was 95.5%. The minimum SpO2 during sleep was 90.0%.  soft snoring was noted during this study.  CARDIAC DATA The 2 lead EKG demonstrated sinus rhythm. The mean heart rate was 68.2 beats per minute. Other EKG findings include: None.  LEG  MOVEMENT DATA The total PLMS were 0 with a resulting PLMS index of 0.0. Associated arousal with leg movement index was 0.0 .  IMPRESSIONS - Severe obstructive sleep apnea occurred during this study (AHI = 38.8/h). - Mild central sleep apnea occurred during this study (CAI = 5.6/h). - The patient had minimal or no oxygen desaturation during the study (Min O2 = 90.0%) - The patient snored with soft snoring volume. - No cardiac abnormalities were noted during this study. - Clinically significant periodic limb movements did not occur during sleep. No significant associated arousals.  DIAGNOSIS - Obstructive Sleep Apnea (327.23 [G47.33 ICD-10]) - Central Sleep Apnea (327.27 [G47.37 ICD-10]) - Nocturnal Hypoxemia (327.26 [G47.36 ICD-10])  RECOMMENDATIONS - CPAP titration to determine optimal pressure required to alleviate sleep disordered breathing. BiPAP or ASV titration may be required to eliminate central sleep apnea. - Positional therapy avoiding supine position during sleep. - Avoid alcohol, sedatives and other CNS depressants that may worsen sleep apnea and disrupt normal sleep architecture. - Sleep hygiene should be reviewed to assess factors that may improve sleep quality. - Weight management and regular exercise should be initiated or continued if appropriate.  [Electronically signed] 06/01/2019 08:37 AM  Fransico Him MD, ABSM Diplomate, American Board of Sleep Medicine

## 2019-06-01 NOTE — Telephone Encounter (Signed)
Pt c/o BP issue: STAT if pt c/o blurred vision, one-sided weakness or slurred speech  1. What are your last 5 BP readings? 94/52 HR 83  2. Are you having any other symptoms (ex. Dizziness, headache, blurred vision, passed out)? Dizzy when standing, SOB when walking upstairs,   3. What is your BP issue? Patient states for the last 3 days she has felt bad, but it comes and goes. She says when she stands she has to sit back down, because she gets dizzy. She states she is also getting SOB when walking upstair. She also states last night she took a sleep apnea test and did not sleep well. Please advise.

## 2019-06-01 NOTE — Telephone Encounter (Signed)
-----   Message from Sheila Margarita, MD sent at 06/01/2019  8:41 AM EDT ----- Please let patient know that they have sleep apnea and recommend CPAP titration. Please set up titration in the sleep lab.

## 2019-06-01 NOTE — Telephone Encounter (Signed)
Informed patient of sleep study results and patient understanding was verbalized. Patient understands her sleep study showed they have sleep apnea and recommend CPAP titration. Please set up titration in the sleep lab.   Pt is aware and agreeable to her results.   titration sent to sleep pool.

## 2019-06-02 NOTE — Progress Notes (Signed)
Cardiology Office Note:    Date:  06/04/2019   ID:  Sheila Gardner, DOB 1935-04-06, MRN [id number]  PCP:  Lawerance Cruel, MD  Cardiologist:  Sinclair Grooms, MD   Referring MD: Lawerance Cruel, MD   Chief Complaint  Patient presents with  . Coronary Artery Disease  . Atrial Fibrillation    History of Present Illness:    Sheila Gardner is a 84 y.o. female with a hx of weakness, nausea, bradycardia,right bundle branch block on EKG, chronic anticoagulation, PAF, successful electrical cardioversion on February 19, 2019, chronic h/o CP and abnormal coronary CTA which led to LAD OA/stent 2021.  Electrical cardioversion performed in December 2020 plan to sinus rhythm which has been maintained until the last several days.  Conversion was not associated with use of antiarrhythmic therapy.  For nearly 1 week, Sheila Gardner has felt fatigued, noted significant variability in heart rate, feels tired when she walks, and has had shortness of breath.  She has had no chest discomfort.  She has history of atrial fibrillation and underwent cardioversion in early January.  She felt great after that but did note some chest discomfort which led to cardiac catheterization and stenting of her LAD.  She has had no recurrence of chest discomfort.  She is on aspirin, Brilinta, and apixaban.  Stent was recently implanted in February.  She is now greater 4 weeks out from stenting and aspirin can be discontinued.  Today's EKG demonstrates recurrent atrial for ablation with controlled rate.  Past Medical History:  Diagnosis Date  . Arthritis   . Breast cancer ([place])   . Cancer ([place])    Breast- rt  . Cough with sputum 2016  . Elevated liver function tests 05/09/2013  . GERD (gastroesophageal reflux disease)    COSTOCHONDRITIS  . Heart murmur   . Hip pain 05/08/2014  . Hyperlipidemia   . Hypertension   . Lipoma    back of neck  . PONV (postoperative nausea and vomiting)    only after rt hip surgery    . Primary osteoarthritis of left knee 11/04/2015  . Primary osteoarthritis of right hip 07/30/2014    Past Surgical History:  Procedure Laterality Date  . ANKLE FRACTURE SURGERY Right 2013   RIGHT   . BREAST LUMPECTOMY Right 2011  . BREAST SURGERY  04/22/2009   Rt lumpectomy  . CARDIOVERSION N/A 02/19/2019   Procedure: CARDIOVERSION;  Surgeon: Dorothy Spark, MD;  Location: [facility] ENDOSCOPY;  Service: Cardiovascular;  Laterality: N/A;  . CORONARY ATHERECTOMY N/A 04/30/2019   Procedure: CORONARY ATHERECTOMY;  Surgeon: Belva Crome, MD;  Location: [place] CV LAB;  Service: Cardiovascular;  Laterality: N/A;  LAD  . CORONARY STENT INTERVENTION N/A 04/30/2019   Procedure: CORONARY STENT INTERVENTION;  Surgeon: Belva Crome, MD;  Location: [place] CV LAB;  Service: Cardiovascular;  Laterality: N/A;  DES TO PROX-MID LAD  . EYE SURGERY  2001   macular hole  . JOINT REPLACEMENT  2016  . LEFT HEART CATH AND CORONARY ANGIOGRAPHY N/A 04/30/2019   Procedure: LEFT HEART CATH AND CORONARY ANGIOGRAPHY;  Surgeon: Belva Crome, MD;  Location: [place] CV LAB;  Service: Cardiovascular;  Laterality: N/A;  . MIDDLE EAR SURGERY Left 08/18/2015   repair eardrum and reconstruction  . OVARY SURGERY  40 years ago   wedge  . TOTAL HIP ARTHROPLASTY Right 07/30/2014   Procedure: TOTAL HIP ARTHROPLASTY ANTERIOR APPROACH;  Surgeon: Melrose Nakayama, MD;  Location: [facility]  OR;  Service: Orthopedics;  Laterality: Right;  . TOTAL KNEE ARTHROPLASTY Left 11/04/2015  . TOTAL KNEE ARTHROPLASTY Left 11/04/2015   Procedure: TOTAL KNEE ARTHROPLASTY;  Surgeon: Melrose Nakayama, MD;  Location: [place];  Service: Orthopedics;  Laterality: Left;  . TYMPANOPLASTY  30 years ago    Current Medications: Current Meds  Medication Sig  . acetaminophen (TYLENOL) 650 MG CR tablet Take 1,300 mg by mouth every 8 (eight) hours as needed for pain.  . Ascorbic Acid (VITAMIN C) 1000 MG tablet Take 1,000 mg by mouth daily.   Marland Kitchen BIOTIN PO  Take 10,000 Units by mouth daily with breakfast.   . cetirizine (ZYRTEC) 10 MG tablet Take 10 mg by mouth daily as needed for allergies.  . Cholecalciferol (VITAMIN D-3) 1000 UNITS CAPS Take 1,000 Units by mouth daily with breakfast.   . cyanocobalamin 1000 MCG tablet Take 1,000 mcg by mouth daily.   Marland Kitchen ELIQUIS 5 MG TABS tablet Take 5 mg by mouth 2 (two) times daily.  Marland Kitchen esomeprazole (NEXIUM) 20 MG capsule Take 20 mg by mouth daily at 12 noon.  . ferrous sulfate 325 (65 FE) MG tablet Take 325 mg by mouth daily with breakfast.  . hydrochlorothiazide (HYDRODIURIL) 25 MG tablet Take 0.5 tablets (12.5 mg total) by mouth daily.  Marland Kitchen LORazepam (ATIVAN) 1 MG tablet Take 1 mg by mouth at bedtime.   Marland Kitchen losartan (COZAAR) 100 MG tablet Take 100 mg by mouth daily.   . Magnesium (CVS TRIPLE MAGNESIUM COMPLEX) 400 MG CAPS Take 400 mg by mouth daily.  Marland Kitchen oxybutynin (DITROPAN) 5 MG tablet Take 5 mg by mouth as needed for bladder spasms.   . potassium chloride (K-DUR) 10 MEQ tablet Take 20 mEq by mouth daily.   . prednisoLONE acetate (PRED FORTE) 1 % ophthalmic suspension Place 1 drop into both eyes daily as needed (Burning).   . pyridOXINE (VITAMIN B-6) 100 MG tablet Take 100 mg by mouth daily.  . rosuvastatin (CRESTOR) 40 MG tablet Take 1 tablet (40 mg total) by mouth daily at 6 PM.  . ticagrelor (BRILINTA) 90 MG TABS tablet Take 1 tablet (90 mg total) by mouth 2 (two) times daily.  . vitamin E 400 UNIT capsule Take 400 Units by mouth daily.     Allergies:   Patient has no known allergies.   Social History   Socioeconomic History  . Marital status: Widowed    Spouse name: Not on file  . Number of children: Not on file  . Years of education: Not on file  . Highest education level: Not on file  Occupational History  . Not on file  Tobacco Use  . Smoking status: Never Smoker  . Smokeless tobacco: Never Used  Substance and Sexual Activity  . Alcohol use: Not Currently    Comment: OCC -none now  . Drug  use: No  . Sexual activity: Not on file  Other Topics Concern  . Not on file  Social History Narrative  . Not on file   Social Determinants of Health   Financial Resource Strain:   . Difficulty of Paying Living Expenses:   Food Insecurity:   . Worried About Charity fundraiser in the Last Year:   . Arboriculturist in the Last Year:   Transportation Needs:   . Film/video editor (Medical):   Marland Kitchen Lack of Transportation (Non-Medical):   Physical Activity:   . Days of Exercise per Week:   . Minutes of Exercise per  Session:   Stress:   . Feeling of Stress :   Social Connections:   . Frequency of Communication with Friends and Family:   . Frequency of Social Gatherings with Friends and Family:   . Attends Religious Services:   . Active Member of Clubs or Organizations:   . Attends Archivist Meetings:   Marland Kitchen Marital Status:      Family History: The patient's family history includes Cancer in her brother; Heart disease in her mother.  ROS:   Please see the history of present illness.    Difficult time expressing how she feels.  Shortness of breath has been the major complaint.  All other systems reviewed and are negative.  EKGs/Labs/Other Studies Reviewed:    The following studies were reviewed today:  CATH PCI 2021: Diagnostic Dominance: Right  Intervention     EKG:  EKG atrial fibrillation with controlled rate is 67 bpm.  Right bundle branch block is noted.  Otherwise no change compared to the prior EKG tracing, A. fib is new.  Recent Labs: 03/25/2019: ALT 19 05/01/2019: Hemoglobin 11.2; Platelets 279 05/17/2019: BUN 8; Creatinine, Ser 0.62; Potassium 3.5; Sodium 131  Recent Lipid Panel No results found for: CHOL, TRIG, HDL, CHOLHDL, VLDL, LDLCALC, LDLDIRECT  Physical Exam:    VS:  BP 124/74   Pulse 99   Ht 5' 4.5" (1.638 m)   Wt 182 lb 12.8 oz (82.9 kg)   SpO2 98%   BMI 30.89 kg/m     Wt Readings from Last 3 Encounters:  06/04/19 182 lb 12.8 oz  (82.9 kg)  05/31/19 176 lb (79.8 kg)  05/17/19 183 lb 3.2 oz (83.1 kg)     GEN: Appears younger than the stated age.. No acute distress HEENT: Normal NECK: No JVD. LYMPHATICS: No lymphadenopathy CARDIAC: II RR without murmur, gallop, or edema. VASCULAR:  Normal Pulses. No bruits. RESPIRATORY:  Clear to auscultation without rales, wheezing or rhonchi  ABDOMEN: Soft, non-tender, non-distended, No pulsatile mass, MUSCULOSKELETAL: No deformity  SKIN: Warm and dry NEUROLOGIC:  Alert and oriented x 3 PSYCHIATRIC:  Normal affect   ASSESSMENT:    1. SOB (shortness of breath)   2. Coronary artery disease of native artery of native heart with stable angina pectoris ([place])   3. Essential hypertension   4. Paroxysmal atrial fibrillation (H[facility])   5. Pure hypercholesterolemia   6. Chronic anticoagulation   7. Type 2 diabetes mellitus with complication, without long-term current use of insulin ([place])   8. Right bundle branch block   9. Educated about COVID-19 virus infection    PLAN:    In order of problems listed above:  1. Related to atrial fibrillation and diastolic heart failure most likely.  A. fib is recurred after successful cardioversion in December.  Heart rate is controlled.  Exertional fatigue and dyspnea a felt related to AF. 2. Recent LAD stent for chest tightness has resolved that symptom.  She had no shortness of breath associated with coronary disease.  She is on triple therapy with aspirin, apixaban, and Brilinta.  Aspirin will be discontinued today. 3. Blood pressure is excellent. 4. Recurrent atrial fibrillation as noted above under #1.  She has had recurrent atrial for ablation after relatively short timeframe following cardioversion.  Likely needs antiarrhythmic therapy added.  Her rate is controlled and her quality of life is suffering and therefore I believe rhythm control needs to be reestablished.  Because at baseline she has bradycardia, I have not started amiodarone and  will depend on the EP service to help guide [place] through the potential best antiarrhythmic therapy to help maintain sinus rhythm in this patient who also has coronary artery disease (question dofetilide). 5. Not discussed 6. Continue apixaban 7. Not discussed 8. Still present 9. Vaccine is been received.  Social distancing is being honored.  EP consultation/A. fib clinic to help determine rhythm management strategy for this patient who in sinus rhythm is energetic and in atrial for ablation is breathless and out of energy.  Antiarrhythmic therapy versus ablation.   Medication Adjustments/Labs and Tests Ordered: Current medicines are reviewed at length with the patient today.  Concerns regarding medicines are outlined above.  Orders Placed This Encounter  Procedures  . Basic Metabolic Panel (BMET)  . Hepatic function panel  . TSH  . CBC  . Pro b natriuretic peptide  . EKG 12-Lead   No orders of the defined types were placed in this encounter.   Patient Instructions  Medication Instructions:  Your physician recommends that you continue on your current medications as directed. Please refer to the Current Medication list given to you today.  *If you need a refill on your cardiac medications before your next appointment, please call your pharmacy*   Lab Work: BMET, Liver, BNP, TSH, CBC  If you have labs (blood work) drawn today and your tests are completely normal, you will receive your results only by: Marland Kitchen MyChart Message (if you have MyChart) OR . A paper copy in the mail If you have any lab test that is abnormal or we need to change your treatment, we will call you to review the results.   Testing/Procedures: None   Follow-Up: At [facility], you and your health needs are our priority.  As part of our continuing mission to provide you with exceptional heart care, we have created designated Provider Care Teams.  These Care Teams include your primary Cardiologist (physician)  and Advanced Practice Providers (APPs -  Physician Assistants and Nurse Practitioners) who all work together to provide you with the care you need, when you need it.  We recommend signing up for the patient portal called "MyChart".  Sign up information is provided on this After Visit Summary.  MyChart is used to connect with patients for Virtual Visits (Telemedicine).  Patients are able to view lab/test results, encounter notes, upcoming appointments, etc.  Non-urgent messages can be sent to your provider as well.   To learn more about what you can do with MyChart, go to [email or web address].    Your next appointment:   ASAP with A-Fib clinic - You are scheduled with the Atrial - Fibrillation Clinic on Wednesday, March 31 @ 11:30. The code to get in to the parking deck is 6007.     Other Instructions     Signed, Sinclair Grooms, MD  06/04/2019 3:36 PM    [facility]

## 2019-06-04 ENCOUNTER — Encounter: Payer: Self-pay | Admitting: Interventional Cardiology

## 2019-06-04 ENCOUNTER — Telehealth: Payer: Self-pay | Admitting: *Deleted

## 2019-06-04 ENCOUNTER — Ambulatory Visit (INDEPENDENT_AMBULATORY_CARE_PROVIDER_SITE_OTHER): Payer: Medicare Other | Admitting: Interventional Cardiology

## 2019-06-04 ENCOUNTER — Ambulatory Visit
Admission: RE | Admit: 2019-06-04 | Discharge: 2019-06-04 | Disposition: A | Discharge status: Home / Self Care | Payer: Medicare Other | Source: Ambulatory Visit | Attending: Family Medicine | Admitting: Family Medicine

## 2019-06-04 ENCOUNTER — Other Ambulatory Visit: Payer: Self-pay

## 2019-06-04 VITALS — BP 124/74 | HR 99 | Ht 64.5 in | Wt 182.8 lb

## 2019-06-04 DIAGNOSIS — I480 Paroxysmal atrial fibrillation: Secondary | ICD-10-CM | POA: Diagnosis not present

## 2019-06-04 DIAGNOSIS — I2510 Atherosclerotic heart disease of native coronary artery without angina pectoris: Secondary | ICD-10-CM | POA: Diagnosis not present

## 2019-06-04 DIAGNOSIS — E7800 Pure hypercholesterolemia, unspecified: Secondary | ICD-10-CM | POA: Diagnosis not present

## 2019-06-04 DIAGNOSIS — R0602 Shortness of breath: Secondary | ICD-10-CM

## 2019-06-04 DIAGNOSIS — I10 Essential (primary) hypertension: Secondary | ICD-10-CM | POA: Diagnosis not present

## 2019-06-04 DIAGNOSIS — I2584 Coronary atherosclerosis due to calcified coronary lesion: Secondary | ICD-10-CM

## 2019-06-04 DIAGNOSIS — Z7901 Long term (current) use of anticoagulants: Secondary | ICD-10-CM | POA: Diagnosis not present

## 2019-06-04 DIAGNOSIS — E118 Type 2 diabetes mellitus with unspecified complications: Secondary | ICD-10-CM | POA: Diagnosis not present

## 2019-06-04 DIAGNOSIS — I4510 Unspecified right bundle-branch block: Secondary | ICD-10-CM

## 2019-06-04 DIAGNOSIS — I25118 Atherosclerotic heart disease of native coronary artery with other forms of angina pectoris: Secondary | ICD-10-CM

## 2019-06-04 DIAGNOSIS — Z7189 Other specified counseling: Secondary | ICD-10-CM | POA: Diagnosis not present

## 2019-06-04 DIAGNOSIS — Z1231 Encounter for screening mammogram for malignant neoplasm of breast: Secondary | ICD-10-CM | POA: Diagnosis not present

## 2019-06-04 NOTE — Telephone Encounter (Signed)
Staff message sent to Sheila Gardner ok to schedule CPAP titration. No PA required. Patient has Medicare.

## 2019-06-04 NOTE — Patient Instructions (Signed)
Medication Instructions:  Your physician recommends that you continue on your current medications as directed. Please refer to the Current Medication list given to you today.  *If you need a refill on your cardiac medications before your next appointment, please call your pharmacy*   Lab Work: BMET, Liver, BNP, TSH, CBC  If you have labs (blood work) drawn today and your tests are completely normal, you will receive your results only by: Marland Kitchen MyChart Message (if you have MyChart) OR . A paper copy in the mail If you have any lab test that is abnormal or we need to change your treatment, we will call you to review the results.   Testing/Procedures: None   Follow-Up: At [facility], you and your health needs are our priority.  As part of our continuing mission to provide you with exceptional heart care, we have created designated Provider Care Teams.  These Care Teams include your primary Cardiologist (physician) and Advanced Practice Providers (APPs -  Physician Assistants and Nurse Practitioners) who all work together to provide you with the care you need, when you need it.  We recommend signing up for the patient portal called "MyChart".  Sign up information is provided on this After Visit Summary.  MyChart is used to connect with patients for Virtual Visits (Telemedicine).  Patients are able to view lab/test results, encounter notes, upcoming appointments, etc.  Non-urgent messages can be sent to your provider as well.   To learn more about what you can do with MyChart, go to [email or web address].    Your next appointment:   ASAP with A-Fib clinic - You are scheduled with the Atrial - Fibrillation Clinic on Wednesday, March 31 @ 11:30. The code to get in to the parking deck is 6007.     Other Instructions

## 2019-06-06 ENCOUNTER — Encounter (HOSPITAL_COMMUNITY): Payer: Self-pay | Admitting: Nurse Practitioner

## 2019-06-06 ENCOUNTER — Other Ambulatory Visit: Payer: Self-pay

## 2019-06-06 ENCOUNTER — Telehealth: Payer: Self-pay | Admitting: Interventional Cardiology

## 2019-06-06 ENCOUNTER — Ambulatory Visit (HOSPITAL_COMMUNITY)
Admission: RE | Admit: 2019-06-06 | Discharge: 2019-06-06 | Disposition: A | Discharge status: Home / Self Care | Payer: Medicare Other | Source: Ambulatory Visit | Attending: Nurse Practitioner | Admitting: Nurse Practitioner

## 2019-06-06 VITALS — BP 118/66 | HR 68 | Ht 64.5 in | Wt 181.0 lb

## 2019-06-06 DIAGNOSIS — D6869 Other thrombophilia: Secondary | ICD-10-CM

## 2019-06-06 DIAGNOSIS — Z79899 Other long term (current) drug therapy: Secondary | ICD-10-CM | POA: Diagnosis not present

## 2019-06-06 DIAGNOSIS — I10 Essential (primary) hypertension: Secondary | ICD-10-CM | POA: Insufficient documentation

## 2019-06-06 DIAGNOSIS — I2510 Atherosclerotic heart disease of native coronary artery without angina pectoris: Secondary | ICD-10-CM | POA: Insufficient documentation

## 2019-06-06 DIAGNOSIS — K219 Gastro-esophageal reflux disease without esophagitis: Secondary | ICD-10-CM | POA: Insufficient documentation

## 2019-06-06 DIAGNOSIS — Z955 Presence of coronary angioplasty implant and graft: Secondary | ICD-10-CM | POA: Diagnosis not present

## 2019-06-06 DIAGNOSIS — I4819 Other persistent atrial fibrillation: Secondary | ICD-10-CM

## 2019-06-06 DIAGNOSIS — I4510 Unspecified right bundle-branch block: Secondary | ICD-10-CM | POA: Diagnosis not present

## 2019-06-06 DIAGNOSIS — I4891 Unspecified atrial fibrillation: Secondary | ICD-10-CM | POA: Diagnosis not present

## 2019-06-06 DIAGNOSIS — E785 Hyperlipidemia, unspecified: Secondary | ICD-10-CM | POA: Insufficient documentation

## 2019-06-06 LAB — CBC
Hematocrit: 34.1 % (ref 34.0–46.6)
Hemoglobin: 11.5 g/dL (ref 11.1–15.9)
MCH: 31.8 pg (ref 26.6–33.0)
MCHC: 33.7 g/dL (ref 31.5–35.7)
MCV: 94 fL (ref 79–97)
Platelets: 337 10*3/uL (ref 150–450)
RBC: 3.62 x10E6/uL — ABNORMAL LOW (ref 3.77–5.28)
RDW: 12.5 % (ref 11.7–15.4)
WBC: 9.6 10*3/uL (ref 3.4–10.8)

## 2019-06-06 LAB — BASIC METABOLIC PANEL
BUN/Creatinine Ratio: 15 (ref 12–28)
BUN: 9 mg/dL (ref 8–27)
CO2: 23 mmol/L (ref 20–29)
Calcium: 9.9 mg/dL (ref 8.7–10.3)
Chloride: 94 mmol/L — ABNORMAL LOW (ref 96–106)
Creatinine, Ser: 0.61 mg/dL (ref 0.57–1.00)
GFR calc Af Amer: 98 mL/min/{1.73_m2} (ref >59)
GFR calc non Af Amer: 85 mL/min/{1.73_m2} (ref >59)
Glucose: 119 mg/dL — ABNORMAL HIGH (ref 65–99)
Potassium: 4.1 mmol/L (ref 3.5–5.2)
Sodium: 132 mmol/L — ABNORMAL LOW (ref 134–144)

## 2019-06-06 LAB — TSH: TSH: 2.05 u[IU]/mL (ref 0.450–4.500)

## 2019-06-06 LAB — MAGNESIUM: Magnesium: 2.1 mg/dL (ref 1.7–2.4)

## 2019-06-06 LAB — HEPATIC FUNCTION PANEL
ALT: 15 IU/L (ref 0–32)
AST: 21 IU/L (ref 0–40)
Albumin: 4.4 g/dL (ref 3.6–4.6)
Alkaline Phosphatase: 67 IU/L (ref 39–117)
Bilirubin Total: 0.7 mg/dL (ref 0.0–1.2)
Bilirubin, Direct: 0.19 mg/dL (ref 0.00–0.40)
Total Protein: 7.3 g/dL (ref 6.0–8.5)

## 2019-06-06 LAB — PRO B NATRIURETIC PEPTIDE: NT-Pro BNP: 744 pg/mL — ABNORMAL HIGH (ref 0–738)

## 2019-06-06 NOTE — Telephone Encounter (Signed)
Attempted to contact [place].  The receptionist said she was unable to locate the [facility] that called.  Will close this note for now.

## 2019-06-06 NOTE — Addendum Note (Signed)
Addended by: Freada Bergeron on: 06/06/2019 10:25 AM   Modules accepted: Orders

## 2019-06-06 NOTE — Progress Notes (Addendum)
Primary Care Physician: Lawerance Cruel, MD Referring Physician: Dr. Jacalyn Lefevre is a 84 y.o. female with a h/o paroxysmal afib, CAD, s/p stent in 04/2019,  HTN that had a DCCV end of December, ERAF when seen by Dr. [name] 3/29. She  is here to discuss issues to restore SR as she feels poorly in Afib. She is rate controlled without rate control meds and when is SR had a HR in the low 40's with a RBBB. She tolerates the bradycardia without symptoms. Dr. [name] preferred not to start amiodarone for fear of worsening her bradycardia and felt  tikosyn may be the best option. Her qtc last ekg in SR was 417 ms. She is on eliquis without any missed doses for at least 3 weeks.  Today, she denies symptoms of palpitations, chest pain, shortness of breath, orthopnea, PND, lower extremity edema, dizziness, presyncope, syncope, or neurologic sequela. The patient is tolerating medications without difficulties and is otherwise without complaint today.   Past Medical History:  Diagnosis Date  . Arthritis   . Breast cancer ([place])   . Cancer ([place])    Breast- rt  . Cough with sputum 2016  . Elevated liver function tests 05/09/2013  . GERD (gastroesophageal reflux disease)    COSTOCHONDRITIS  . Heart murmur   . Hip pain 05/08/2014  . Hyperlipidemia   . Hypertension   . Lipoma    back of neck  . PONV (postoperative nausea and vomiting)    only after rt hip surgery  . Primary osteoarthritis of left knee 11/04/2015  . Primary osteoarthritis of right hip 07/30/2014   Past Surgical History:  Procedure Laterality Date  . ANKLE FRACTURE SURGERY Right 2013   RIGHT   . BREAST LUMPECTOMY Right 2011  . BREAST SURGERY  04/22/2009   Rt lumpectomy  . CARDIOVERSION N/A 02/19/2019   Procedure: CARDIOVERSION;  Surgeon: Dorothy Spark, MD;  [facility] ENDOSCOPY;  Service: Cardiovascular;  Laterality: N/A;  . CORONARY ATHERECTOMY N/A 04/30/2019   Procedure: CORONARY ATHERECTOMY;  Surgeon: Belva Crome, MD;  Location: [place] CV LAB;  Service: Cardiovascular;  Laterality: N/A;  LAD  . CORONARY STENT INTERVENTION N/A 04/30/2019   Procedure: CORONARY STENT INTERVENTION;  Surgeon: Belva Crome, MD;  Location: [place] CV LAB;  Service: Cardiovascular;  Laterality: N/A;  DES TO PROX-MID LAD  . EYE SURGERY  2001   macular hole  . JOINT REPLACEMENT  2016  . LEFT HEART CATH AND CORONARY ANGIOGRAPHY N/A 04/30/2019   Procedure: LEFT HEART CATH AND CORONARY ANGIOGRAPHY;  Surgeon: Belva Crome, MD;  Location: [place] CV LAB;  Service: Cardiovascular;  Laterality: N/A;  . MIDDLE EAR SURGERY Left 08/18/2015   repair eardrum and reconstruction  . OVARY SURGERY  40 years ago   wedge  . TOTAL HIP ARTHROPLASTY Right 07/30/2014   Procedure: TOTAL HIP ARTHROPLASTY ANTERIOR APPROACH;  Surgeon: Melrose Nakayama, MD;  Location: [place];  Service: Orthopedics;  Laterality: Right;  . TOTAL KNEE ARTHROPLASTY Left 11/04/2015  . TOTAL KNEE ARTHROPLASTY Left 11/04/2015   Procedure: TOTAL KNEE ARTHROPLASTY;  Surgeon: Melrose Nakayama, MD;  Location: [place];  Service: Orthopedics;  Laterality: Left;  . TYMPANOPLASTY  30 years ago    Current Outpatient Medications  Medication Sig Dispense Refill  . acetaminophen (TYLENOL) 650 MG CR tablet Take 1,300 mg by mouth every 8 (eight) hours as needed for pain.    . Ascorbic Acid (VITAMIN C) 1000  MG tablet Take 1,000 mg by mouth daily.     Marland Kitchen BIOTIN PO Take 10,000 Units by mouth daily with breakfast.     . cetirizine (ZYRTEC) 10 MG tablet Take 10 mg by mouth daily as needed for allergies.    . Cholecalciferol (VITAMIN D-3) 1000 UNITS CAPS Take 1,000 Units by mouth daily with breakfast.     . cyanocobalamin 1000 MCG tablet Take 1,000 mcg by mouth daily.     Marland Kitchen ELIQUIS 5 MG TABS tablet Take 5 mg by mouth 2 (two) times daily.    Marland Kitchen esomeprazole (NEXIUM) 20 MG capsule Take 20 mg by mouth daily at 12 noon.    . ferrous sulfate 325 (65 FE) MG tablet Take 325 mg by mouth daily  with breakfast.    . hydrochlorothiazide (HYDRODIURIL) 25 MG tablet Take 0.5 tablets (12.5 mg total) by mouth daily. 45 tablet 3  . LORazepam (ATIVAN) 1 MG tablet Take 1 mg by mouth at bedtime.     Marland Kitchen losartan (COZAAR) 100 MG tablet Take 100 mg by mouth daily.     . Magnesium (CVS TRIPLE MAGNESIUM COMPLEX) 400 MG CAPS Take 400 mg by mouth daily.    Marland Kitchen oxybutynin (DITROPAN) 5 MG tablet Take 5 mg by mouth as needed for bladder spasms.     . potassium chloride (K-DUR) 10 MEQ tablet Taking two tablets by mouth daily    . prednisoLONE acetate (PRED FORTE) 1 % ophthalmic suspension Place 1 drop into both eyes daily as needed (Burning).     . pyridOXINE (VITAMIN B-6) 100 MG tablet Take 100 mg by mouth daily.    . rosuvastatin (CRESTOR) 40 MG tablet Take 1 tablet (40 mg total) by mouth daily at 6 PM. 30 tablet 6  . ticagrelor (BRILINTA) 90 MG TABS tablet Take 1 tablet (90 mg total) by mouth 2 (two) times daily. 60 tablet 11  . vitamin E 400 UNIT capsule Take 400 Units by mouth daily.     No current facility-administered medications for this encounter.    No Known Allergies  Social History   Socioeconomic History  . Marital status: Widowed    Spouse name: Not on file  . Number of children: Not on file  . Years of education: Not on file  . Highest education level: Not on file  Occupational History  . Not on file  Tobacco Use  . Smoking status: Never Smoker  . Smokeless tobacco: Never Used  Substance and Sexual Activity  . Alcohol use: Not Currently    Comment: OCC -none now  . Drug use: No  . Sexual activity: Not on file  Other Topics Concern  . Not on file  Social History Narrative  . Not on file   Social Determinants of Health   Financial Resource Strain:   . Difficulty of Paying Living Expenses:   Food Insecurity:   . Worried About Charity fundraiser in the Last Year:   . Arboriculturist in the Last Year:   Transportation Needs:   . Film/video editor (Medical):   Marland Kitchen Lack  of Transportation (Non-Medical):   Physical Activity:   . Days of Exercise per Week:   . Minutes of Exercise per Session:   Stress:   . Feeling of Stress :   Social Connections:   . Frequency of Communication with Friends and Family:   . Frequency of Social Gatherings with Friends and Family:   . Attends Religious Services:   .  Active Member of Clubs or Organizations:   . Attends Archivist Meetings:   Marland Kitchen Marital Status:   Intimate Partner Violence:   . Fear of Current or Ex-Partner:   . Emotionally Abused:   Marland Kitchen Physically Abused:   . Sexually Abused:     Family History  Problem Relation Age of Onset  . Heart disease Mother   . Cancer Brother        colon, pancreatic, brain    ROS- All systems are reviewed and negative except as per the HPI above  Physical Exam: Vitals:   06/06/19 1159  BP: 118/66  Pulse: 68  Weight: 82.1 kg  Height: 5' 4.5" (1.638 m)   Wt Readings from Last 3 Encounters:  06/06/19 82.1 kg  06/04/19 82.9 kg  05/31/19 79.8 kg    Labs: Lab Results  Component Value Date   NA 132 (L) 06/04/2019   K 4.1 06/04/2019   CL 94 (L) 06/04/2019   CO2 23 06/04/2019   GLUCOSE 119 (H) 06/04/2019   BUN 9 06/04/2019   CREATININE 0.61 06/04/2019   CALCIUM 9.9 06/04/2019   MG 2.1 06/06/2019   Lab Results  Component Value Date   INR 1.02 10/24/2015   No results found for: CHOL, HDL, LDLCALC, TRIG   GEN- The patient is well appearing, alert and oriented x 3 today.   Head- normocephalic, atraumatic Eyes-  Sclera clear, conjunctiva pink Ears- hearing intact Oropharynx- clear Neck- supple, no JVP Lymph- no cervical lymphadenopathy Lungs- Clear to ausculation bilaterally, normal work of breathing Heart- irregular rate and rhythm, no murmurs, rubs or gallops, PMI not laterally displaced GI- soft, NT, ND, + BS Extremities- no clubbing, cyanosis, or edema MS- no significant deformity or atrophy Skin- no rash or lesion Psych- euthymic mood, full  affect Neuro- strength and sensation are intact  EKG- today with  afib at 68 bpm,   qrs int 148 ms, qtc 452 ms EKG in sinus brady at 42 bpm, RBBB, pr int 206 ms, qrs int 146 and qtc 417 ms    Assessment and Plan: 1. Persistent  afib I discussed with Dr. Rayann Heman and he feels pt is not an ablation candidate 2/2 to age and that Tenino may be her best option  With her bradycardia at baseline, he feels she will have to be watched carefully in the future for possible PPM I discussed the risk vrs benefit of tikosy admit with the pt and daughter and will plan for admission this Monday She will have to stop her HCTZ 3 days prior No benadryl crcl cal is 92 so she would be able to start at 500 mcg of dofetilide bid She will check on cost of drug and let us know No benadryl use  Mag today  Recent K+ was in range   2. [id number] score of at least 5 Continue eliquis 5 mg bid without any missed doses   3. CAD No angina symptoms Stent 04/2019  Continue brilinta, now off asa  F/u on Monday for tikosyn admit   Butch Penny C. Kailene Steinhart, [facility] [address] [place], [place] [address] [phone]

## 2019-06-06 NOTE — Telephone Encounter (Signed)
RE: precert Lauralee Evener, CMA  Freada Bergeron, CMA  Ok to schedule. Patient has medicare. No PA is required.          ----- Message -----  From: Freada Bergeron, CMA  Sent: 06/01/2019  4:11 PM EDT  To: [facility]  Subject: precert                      recommend CPAP titration.

## 2019-06-06 NOTE — Telephone Encounter (Signed)
Mary from [facility] calling stating they have been receiving faxes about the patient, but she is not a patient of theirs.

## 2019-06-06 NOTE — Telephone Encounter (Signed)
Patient is scheduled for CPAP Titration on 06/30/19. Pt is scheduled for COVID screening on 06/28/19 12:20 prior to titration.  Patient understands his titration study will be done at [facility] sleep lab. Patient understands he will receive a letter in a week or so detailing appointment, date, time, and location. Patient understands to call if he does not receive the letter  in a timely manner. Patient agrees with treatment and thanked me for call.

## 2019-06-07 ENCOUNTER — Other Ambulatory Visit (HOSPITAL_COMMUNITY): Payer: Self-pay | Admitting: *Deleted

## 2019-06-07 ENCOUNTER — Telehealth: Payer: Self-pay | Admitting: Pharmacist

## 2019-06-07 ENCOUNTER — Other Ambulatory Visit (HOSPITAL_COMMUNITY)
Admission: RE | Admit: 2019-06-07 | Discharge: 2019-06-07 | Disposition: A | Discharge status: Home / Self Care | Payer: Medicare Other | Source: Ambulatory Visit | Attending: Nurse Practitioner | Admitting: Nurse Practitioner

## 2019-06-07 ENCOUNTER — Encounter (HOSPITAL_COMMUNITY): Payer: Self-pay

## 2019-06-07 DIAGNOSIS — Z20822 Contact with and (suspected) exposure to covid-19: Secondary | ICD-10-CM | POA: Insufficient documentation

## 2019-06-07 DIAGNOSIS — H524 Presbyopia: Secondary | ICD-10-CM | POA: Diagnosis not present

## 2019-06-07 DIAGNOSIS — Z961 Presence of intraocular lens: Secondary | ICD-10-CM | POA: Diagnosis not present

## 2019-06-07 DIAGNOSIS — H43813 Vitreous degeneration, bilateral: Secondary | ICD-10-CM | POA: Diagnosis not present

## 2019-06-07 DIAGNOSIS — H43393 Other vitreous opacities, bilateral: Secondary | ICD-10-CM | POA: Diagnosis not present

## 2019-06-07 DIAGNOSIS — Z01812 Encounter for preprocedural laboratory examination: Secondary | ICD-10-CM | POA: Diagnosis not present

## 2019-06-07 DIAGNOSIS — H1033 Unspecified acute conjunctivitis, bilateral: Secondary | ICD-10-CM | POA: Diagnosis not present

## 2019-06-07 DIAGNOSIS — H40053 Ocular hypertension, bilateral: Secondary | ICD-10-CM | POA: Diagnosis not present

## 2019-06-07 DIAGNOSIS — H40013 Open angle with borderline findings, low risk, bilateral: Secondary | ICD-10-CM | POA: Diagnosis not present

## 2019-06-07 LAB — SARS CORONAVIRUS 2 (TAT 6-24 HRS): SARS Coronavirus 2: NEGATIVE

## 2019-06-07 NOTE — Telephone Encounter (Signed)
Medication list reviewed in anticipation of upcoming Tikosyn initiation. Patient is taking HCTZ but is aware she will need to stop this 3 days prior to [facility] admission. No other contraindicated or QTc prolonging medications.   Patient is anticoagulated on Eliquis 5mg  BID on the appropriate dose. Please ensure that patient has not missed any anticoagulation doses in the 3 weeks prior to Tikosyn initiation.   Mg and K both at goal. Patient will need to be counseled to avoid use of Benadryl while on Tikosyn and in the 2-3 days prior to Tikosyn initiation.

## 2019-06-11 ENCOUNTER — Encounter (HOSPITAL_COMMUNITY): Payer: Self-pay | Admitting: Nurse Practitioner

## 2019-06-11 ENCOUNTER — Other Ambulatory Visit: Payer: Self-pay

## 2019-06-11 ENCOUNTER — Inpatient Hospital Stay (HOSPITAL_COMMUNITY)
Admission: AD | Admit: 2019-06-11 | Discharge: 2019-06-14 | DRG: 310 | Disposition: A | Discharge status: Home / Self Care | Payer: Medicare Other | Attending: Internal Medicine | Admitting: Internal Medicine

## 2019-06-11 ENCOUNTER — Ambulatory Visit (HOSPITAL_COMMUNITY)
Admission: RE | Admit: 2019-06-11 | Discharge: 2019-06-11 | Disposition: A | Discharge status: Home / Self Care | Payer: Medicare Other | Source: Ambulatory Visit | Attending: Nurse Practitioner | Admitting: Nurse Practitioner

## 2019-06-11 VITALS — BP 122/68 | HR 81 | Ht 64.5 in | Wt 179.4 lb

## 2019-06-11 DIAGNOSIS — Z5181 Encounter for therapeutic drug level monitoring: Secondary | ICD-10-CM | POA: Diagnosis not present

## 2019-06-11 DIAGNOSIS — K219 Gastro-esophageal reflux disease without esophagitis: Secondary | ICD-10-CM | POA: Diagnosis not present

## 2019-06-11 DIAGNOSIS — E876 Hypokalemia: Secondary | ICD-10-CM | POA: Diagnosis present

## 2019-06-11 DIAGNOSIS — Z7901 Long term (current) use of anticoagulants: Secondary | ICD-10-CM | POA: Diagnosis not present

## 2019-06-11 DIAGNOSIS — I10 Essential (primary) hypertension: Secondary | ICD-10-CM | POA: Diagnosis present

## 2019-06-11 DIAGNOSIS — I2510 Atherosclerotic heart disease of native coronary artery without angina pectoris: Secondary | ICD-10-CM | POA: Diagnosis present

## 2019-06-11 DIAGNOSIS — Z853 Personal history of malignant neoplasm of breast: Secondary | ICD-10-CM

## 2019-06-11 DIAGNOSIS — Z955 Presence of coronary angioplasty implant and graft: Secondary | ICD-10-CM

## 2019-06-11 DIAGNOSIS — I480 Paroxysmal atrial fibrillation: Secondary | ICD-10-CM | POA: Diagnosis present

## 2019-06-11 DIAGNOSIS — I4819 Other persistent atrial fibrillation: Secondary | ICD-10-CM

## 2019-06-11 DIAGNOSIS — Z8249 Family history of ischemic heart disease and other diseases of the circulatory system: Secondary | ICD-10-CM

## 2019-06-11 DIAGNOSIS — I4891 Unspecified atrial fibrillation: Secondary | ICD-10-CM | POA: Diagnosis not present

## 2019-06-11 DIAGNOSIS — M1990 Unspecified osteoarthritis, unspecified site: Secondary | ICD-10-CM | POA: Diagnosis present

## 2019-06-11 DIAGNOSIS — E785 Hyperlipidemia, unspecified: Secondary | ICD-10-CM | POA: Diagnosis not present

## 2019-06-11 DIAGNOSIS — Z20822 Contact with and (suspected) exposure to covid-19: Secondary | ICD-10-CM | POA: Diagnosis present

## 2019-06-11 DIAGNOSIS — Z79899 Other long term (current) drug therapy: Secondary | ICD-10-CM | POA: Diagnosis not present

## 2019-06-11 DIAGNOSIS — D6869 Other thrombophilia: Secondary | ICD-10-CM

## 2019-06-11 LAB — BASIC METABOLIC PANEL
Anion gap: 8 (ref 5–15)
BUN: 7 mg/dL — ABNORMAL LOW (ref 8–23)
CO2: 25 mmol/L (ref 22–32)
Calcium: 9.7 mg/dL (ref 8.9–10.3)
Chloride: 100 mmol/L (ref 98–111)
Creatinine, Ser: 0.68 mg/dL (ref 0.44–1.00)
GFR calc Af Amer: >60 mL/min (ref >60)
GFR calc non Af Amer: >60 mL/min (ref >60)
Glucose, Bld: 106 mg/dL — ABNORMAL HIGH (ref 70–99)
Potassium: 4.4 mmol/L (ref 3.5–5.1)
Sodium: 133 mmol/L — ABNORMAL LOW (ref 135–145)

## 2019-06-11 LAB — MAGNESIUM: Magnesium: 2.2 mg/dL (ref 1.7–2.4)

## 2019-06-11 MED ORDER — POTASSIUM CHLORIDE CRYS ER 10 MEQ PO TBCR: 10.0000 meq | EXTENDED_RELEASE_TABLET | Freq: Every day | ORAL | Status: DC

## 2019-06-11 MED ORDER — MAGNESIUM OXIDE 400 (241.3 MG) MG PO TABS: 400.0000 mg | ORAL_TABLET | Freq: Every day | ORAL | Status: DC

## 2019-06-11 MED ORDER — DOFETILIDE 500 MCG PO CAPS
500.0000 ug | ORAL_CAPSULE | Freq: Two times a day (BID) | ORAL | Status: DC
  Administered 2019-06-11 – 2019-06-14 (×6): 500 ug via ORAL
  Filled 2019-06-11 (×6): qty 1

## 2019-06-11 MED ORDER — SODIUM CHLORIDE 0.9% FLUSH
3.0000 mL | Freq: Two times a day (BID) | INTRAVENOUS | Status: DC
  Administered 2019-06-11 – 2019-06-13 (×5): 3 mL via INTRAVENOUS

## 2019-06-11 MED ORDER — FERROUS SULFATE 325 (65 FE) MG PO TABS: 325.0000 mg | ORAL_TABLET | Freq: Every day | ORAL | Status: DC

## 2019-06-11 MED ORDER — ACETAMINOPHEN 325 MG PO TABS: 650.0000 mg | ORAL_TABLET | Freq: Three times a day (TID) | ORAL | Status: DC | PRN

## 2019-06-11 MED ORDER — PANTOPRAZOLE SODIUM 40 MG PO TBEC: 40.0000 mg | DELAYED_RELEASE_TABLET | Freq: Every day | ORAL | Status: DC

## 2019-06-11 MED ORDER — ROSUVASTATIN CALCIUM 20 MG PO TABS
40.0000 mg | ORAL_TABLET | Freq: Every day | ORAL | Status: DC
  Administered 2019-06-11 – 2019-06-13 (×3): 40 mg via ORAL
  Filled 2019-06-11 (×3): qty 2

## 2019-06-11 MED ORDER — SODIUM CHLORIDE 0.9 % IV SOLN
250.0000 mL | INTRAVENOUS | Status: DC | PRN
  Administered 2019-06-13: 250 mL via INTRAVENOUS

## 2019-06-11 MED ORDER — LORAZEPAM 1 MG PO TABS
1.0000 mg | ORAL_TABLET | Freq: Every day | ORAL | Status: DC
  Administered 2019-06-11 – 2019-06-13 (×3): 1 mg via ORAL
  Filled 2019-06-11 (×3): qty 1

## 2019-06-11 MED ORDER — LOSARTAN POTASSIUM 50 MG PO TABS: 100.0000 mg | ORAL_TABLET | Freq: Every day | ORAL | Status: DC

## 2019-06-11 MED ORDER — ACETAMINOPHEN 325 MG PO TABS
650.0000 mg | ORAL_TABLET | Freq: Three times a day (TID) | ORAL | Status: DC | PRN
  Administered 2019-06-11 – 2019-06-13 (×4): 650 mg via ORAL
  Filled 2019-06-11 (×4): qty 2

## 2019-06-11 MED ORDER — APIXABAN 5 MG PO TABS
5.0000 mg | ORAL_TABLET | Freq: Two times a day (BID) | ORAL | Status: DC
  Administered 2019-06-11 – 2019-06-14 (×6): 5 mg via ORAL
  Filled 2019-06-11 (×6): qty 1

## 2019-06-11 MED ORDER — SODIUM CHLORIDE 0.9% FLUSH: 3.0000 mL | INTRAVENOUS | Status: DC | PRN

## 2019-06-11 MED ORDER — TICAGRELOR 90 MG PO TABS
90.0000 mg | ORAL_TABLET | Freq: Two times a day (BID) | ORAL | Status: DC
  Administered 2019-06-11 – 2019-06-14 (×6): 90 mg via ORAL
  Filled 2019-06-11 (×6): qty 1

## 2019-06-11 NOTE — Care Management (Signed)
06-11-19 1311 Patient presented for Tikosyn Load. Benefits check submitted for Tikosyn. Case Manager will follow for cost and make patient aware. Graves-Bigelow, Ocie Cornfield, RN, BSN Case Manager

## 2019-06-11 NOTE — Progress Notes (Signed)
Pharmacy: Dofetilide (Tikosyn) - Initial Consult Assessment and Electrolyte Replacement  Pharmacy consulted to assist in monitoring and replacing electrolytes in this 84 y.o. female admitted on 06/11/2019 undergoing dofetilide initiation. First dofetilide dose: 06/11/19  Assessment:  Patient Exclusion Criteria: If any screening criteria checked as "Yes", then  patient  should NOT receive dofetilide until criteria item is corrected.  If "Yes" please indicate correction plan.  YES  NO Patient  Exclusion Criteria Correction Plan   [x]   []   Baseline QTc interval is greater than or equal to 440 msec. IF above YES box checked dofetilide contraindicated unless patient has ICD; then may proceed if QTc 500-550 msec or with known ventricular conduction abnormalities may proceed with QTc 550-600 msec. QTc = 490s per ecg Per MD calculated qtc is acceptable to start   []   [x]   Patient is known or suspected to have a digoxin level greater than 2 ng/ml: No results found for: DIGOXIN     []   [x]   Creatinine clearance less than 20 ml/min (calculated using Cockcroft-Gault, actual body weight and serum creatinine): Estimated Creatinine Clearance: 56.8 mL/min (by C-G formula based on SCr of 0.68 mg/dL).     []   [x]  Patient has received drugs known to prolong the QT intervals within the last 48 hours (phenothiazines, tricyclics or tetracyclic antidepressants, erythromycin, H-1 antihistamines, cisapride, fluoroquinolones, azithromycin). Updated information on QT prolonging agents is available to be searched on the following database:QT prolonging agents     []   [x]   Patient received a dose of hydrochlorothiazide (Oretic) alone or in any combination including triamterene (Dyazide, Maxzide) in the last 48 hours.    []   [x]  Patient received a medication known to increase dofetilide plasma concentrations prior to initial dofetilide dose:  . Trimethoprim (Primsol, Proloprim) in the last 36 hours . Verapamil  (Calan, Verelan) in the last 36 hours or a sustained release dose in the last 72 hours . Megestrol (Megace) in the last 5 days  . Cimetidine (Tagamet) in the last 6 hours . Ketoconazole (Nizoral) in the last 24 hours . Itraconazole (Sporanox) in the last 48 hours  . Prochlorperazine (Compazine) in the last 36 hours     []   [x]   Patient is known to have a history of torsades de pointes; congenital or acquired long QT syndromes.    []   [x]   Patient has received a Class 1 antiarrhythmic with less than 2 half-lives since last dose. (Disopyramide, Quinidine, Procainamide, Lidocaine, Mexiletine, Flecainide, Propafenone)    []   [x]   Patient has received amiodarone therapy in the past 3 months or amiodarone level is greater than 0.3 ng/ml.    Patient has been appropriately anticoagulated with apixaban.  Labs:    Component Value Date/Time   K 4.4 06/11/2019 1103   K 4.2 05/04/2016 1011   MG 2.2 06/11/2019 1103     Plan: Potassium: K >/= 4: Appropriate to initiate Tikosyn, no replacement needed    Magnesium: Mg >2: Appropriate to initiate Tikosyn, no replacement needed     Thank you for allowing pharmacy to participate in this patient's care   Erin Hearing PharmD., BCPS Clinical Pharmacist 06/11/2019 2:31 PM

## 2019-06-11 NOTE — TOC Benefit Eligibility Note (Signed)
Transition of Care [facility]) Benefit Eligibility Note    Patient Details  Name: Sheila Gardner MRN: [id number] Date of Birth: 1935-09-11   Medication/Dose: DOFETILIDE   CAPSULE  125 MCG  COPAY- $ 347.00,     250 MCG BID  CO-PAY- $ 347.00    , 500 MCG BID   CO-PAY- $ 347.00  Covered?: Yes  Tier: (TIER- 4 DRUG)  Prescription Coverage Preferred Pharmacy: Roseanne Kaufman with Person/Company/Phone Number:: DEE   @ [employer] # [phone]  Co-Pay: $ 347.00 FOR EACH PRESCRIPTION     Deductible: Met(OUT-OF-POCKET- NOT MET)  Additional Notes: TIKOSYN  CAPSULE 125 MCG BID  250 MCG BID   500 MCG BID : NON-FORMULARY  and  NOT COVER , P/A 3 [phone]    Memory Argue Phone Number: 06/11/2019, 2:23 PM

## 2019-06-11 NOTE — H&P (Signed)
Primary Care Physician: Lawerance Cruel, MD  Referring Physician: Dr. Jacalyn Lefevre is a 84 y.o. female with a h/o paroxysmal afib, CAD, s/p stent in 04/2019, HTN that had a DCCV end of December, ERAF when seen by Dr. [name] 3/29. She is here to discuss issues to restore SR as she feels poorly in Afib. She is rate controlled without rate control meds and when is SR had a HR in the low 40's with a RBBB. She tolerates the bradycardia without symptoms. Dr. [name] preferred not to start amiodarone for fear of worsening her bradycardia and felt tikosyn may be the best option. Her qtc last ekg in SR was 417 ms. She is on eliquis without any missed doses for at least 3 weeks.  F/u afib clinic, 06/11/19. She is here for tikosyn admit. She checked the price of the drug with her drug supplement program and found she has poor drug coverage for dofetilide. She plans to use [facility]. She is very slow in SR with a RBBB, usually in the low 40's, which she tolerates, better than rate controlled afib( no rate control meds on board) and will have to be followed carefully for bradycardia/PPM in the future. Choice of antiarrythmic was to avoid those with rate slowing properties. She has not missed any of her eliquis 5 mg bid with a CHA2DS2VASc score of at least 5 and stopped her HCTZ 3 days ago. No benadryl use. Qtc on previous ekg's in SR show acceptable qtc to start Tikosyn.  Today, she denies symptoms of palpitations, chest pain, shortness of breath, orthopnea, PND, lower extremity edema, dizziness, presyncope, syncope, or neurologic sequela. The patient is tolerating medications without difficulties and is otherwise without complaint today.      Past Medical History:  Diagnosis Date  . Arthritis   . Breast cancer ([place])   . Cancer ([place])    Breast- rt  . Cough with sputum 2016  . Elevated liver function tests 05/09/2013  . GERD (gastroesophageal reflux disease)    COSTOCHONDRITIS  . Heart murmur   . Hip pain  05/08/2014  . Hyperlipidemia   . Hypertension   . Lipoma    back of neck  . PONV (postoperative nausea and vomiting)    only after rt hip surgery  . Primary osteoarthritis of left knee 11/04/2015  . Primary osteoarthritis of right hip 07/30/2014        Past Surgical History:  Procedure Laterality Date  . ANKLE FRACTURE SURGERY Right 2013   RIGHT   . BREAST LUMPECTOMY Right 2011  . BREAST SURGERY  04/22/2009   Rt lumpectomy  . CARDIOVERSION N/A 02/19/2019   Procedure: CARDIOVERSION; Surgeon: Dorothy Spark, MD; Location[facility] ENDOSCOPY; Service: Cardiovascular; Laterality: N/A;  . CORONARY ATHERECTOMY N/A 04/30/2019   Procedure: CORONARY ATHERECTOMY; Surgeon: Belva Crome, MD; Location: [place] CV LAB; Service: Cardiovascular; Laterality: N/A; LAD  . CORONARY STENT INTERVENTION N/A 04/30/2019   Procedure: CORONARY STENT INTERVENTION; Surgeon: Belva Crome, MD; Location: [place] CV LAB; Service: Cardiovascular; Laterality: N/A; DES TO PROX-MID LAD  . EYE SURGERY  2001   macular hole  . JOINT REPLACEMENT  2016  . LEFT HEART CATH AND CORONARY ANGIOGRAPHY N/A 04/30/2019   Procedure: LEFT HEART CATH AND CORONARY ANGIOGRAPHY; Surgeon: Belva Crome, MD; Location: [place] CV LAB; Service: Cardiovascular; Laterality: N/A;  . MIDDLE EAR SURGERY Left 08/18/2015   repair eardrum and reconstruction  . OVARY SURGERY  40 years ago  wedge  . TOTAL HIP ARTHROPLASTY Right 07/30/2014   Procedure: TOTAL HIP ARTHROPLASTY ANTERIOR APPROACH; Surgeon: Melrose Nakayama, MD; Location: [place]; Service: Orthopedics; Laterality: Right;  . TOTAL KNEE ARTHROPLASTY Left 11/04/2015  . TOTAL KNEE ARTHROPLASTY Left 11/04/2015   Procedure: TOTAL KNEE ARTHROPLASTY; Surgeon: Melrose Nakayama, MD; Location: [place]; Service: Orthopedics; Laterality: Left;  . TYMPANOPLASTY  30 years ago         Current Outpatient Medications  Medication Sig Dispense Refill  . acetaminophen (TYLENOL) 650 MG CR tablet Take  1,300 mg by mouth every 8 (eight) hours as needed for pain.    . Ascorbic Acid (VITAMIN C) 1000 MG tablet Take 1,000 mg by mouth daily.     Marland Kitchen BIOTIN PO Take 10,000 Units by mouth daily with breakfast.     . cetirizine (ZYRTEC) 10 MG tablet Take 10 mg by mouth daily as needed for allergies.    . Cholecalciferol (VITAMIN D-3) 1000 UNITS CAPS Take 1,000 Units by mouth daily with breakfast.     . cyanocobalamin 1000 MCG tablet Take 1,000 mcg by mouth daily.     Marland Kitchen ELIQUIS 5 MG TABS tablet Take 5 mg by mouth 2 (two) times daily.    Marland Kitchen esomeprazole (NEXIUM) 20 MG capsule Take 20 mg by mouth daily at 12 noon.    . ferrous sulfate 325 (65 FE) MG tablet Take 325 mg by mouth daily with breakfast.    . hydrochlorothiazide (HYDRODIURIL) 25 MG tablet Take 0.5 tablets (12.5 mg total) by mouth daily. 45 tablet 3  . LORazepam (ATIVAN) 1 MG tablet Take 1 mg by mouth at bedtime.     Marland Kitchen losartan (COZAAR) 100 MG tablet Take 100 mg by mouth daily.     . Magnesium ([employer]S TRIPLE MAGNESIUM COMPLEX) 400 MG CAPS Take 400 mg by mouth daily.    Marland Kitchen oxybutynin (DITROPAN) 5 MG tablet Take 5 mg by mouth as needed for bladder spasms.     . potassium chloride (K-DUR) 10 MEQ tablet Taking two tablets by mouth daily    . prednisoLONE acetate (PRED FORTE) 1 % ophthalmic suspension Place 1 drop into both eyes daily as needed (Burning).     . pyridOXINE (VITAMIN B-6) 100 MG tablet Take 100 mg by mouth daily.    . rosuvastatin (CRESTOR) 40 MG tablet Take 1 tablet (40 mg total) by mouth daily at 6 PM. 30 tablet 6  . ticagrelor (BRILINTA) 90 MG TABS tablet Take 1 tablet (90 mg total) by mouth 2 (two) times daily. 60 tablet 11  . vitamin E 400 UNIT capsule Take 400 Units by mouth daily.     No current facility-administered medications for this encounter.   No Known Allergies  Social History        Socioeconomic History  . Marital status: Widowed    Spouse name: Not on file  . Number of children: Not on file  . Years of education: Not  on file  . Highest education level: Not on file  Occupational History  . Not on file  Tobacco Use  . Smoking status: Never Smoker  . Smokeless tobacco: Never Used  Substance and Sexual Activity  . Alcohol use: Not Currently    Comment: OCC -none now  . Drug use: No  . Sexual activity: Not on file  Other Topics Concern  . Not on file  Social History Narrative  . Not on file   Social Determinants of Health      Financial Resource Strain:   .  Difficulty of Paying Living Expenses:   Food Insecurity:   . Worried About Charity fundraiser in the Last Year:   . Arboriculturist in the Last Year:   Transportation Needs:   . Film/video editor (Medical):   Marland Kitchen Lack of Transportation (Non-Medical):   Physical Activity:   . Days of Exercise per Week:   . Minutes of Exercise per Session:   Stress:   . Feeling of Stress :   Social Connections:   . Frequency of Communication with Friends and Family:   . Frequency of Social Gatherings with Friends and Family:   . Attends Religious Services:   . Active Member of Clubs or Organizations:   . Attends Archivist Meetings:   Marland Kitchen Marital Status:   Intimate Partner Violence:   . Fear of Current or Ex-Partner:   . Emotionally Abused:   Marland Kitchen Physically Abused:   . Sexually Abused:         Family History  Problem Relation Age of Onset  . Heart disease Mother   . Cancer Brother    colon, pancreatic, brain   ROS- All systems are reviewed and negative except as per the HPI above  Physical Exam:  There were no vitals filed for this visit.     Wt Readings from Last 3 Encounters:  06/06/19 181 lb (82.1 kg)  06/04/19 182 lb 12.8 oz (82.9 kg)  05/31/19 176 lb (79.8 kg)   Labs:  Recent Labs                                                                       Recent Labs                       Recent Labs    GEN- The patient is well appearing, alert and oriented x 3 today.  Head- normocephalic, atraumatic   Eyes- Sclera clear, conjunctiva pink  Ears- hearing intact  Oropharynx- clear  Neck- supple, no JVP  Lymph- no cervical lymphadenopathy  Lungs- Clear to ausculation bilaterally, normal work of breathing  Heart- irregular rate and rhythm, no murmurs, rubs or gallops, PMI not laterally displaced  GI- soft, NT, ND, + BS  Extremities- no clubbing, cyanosis, or edema  MS- no significant deformity or atrophy  Skin- no rash or lesion  Psych- euthymic mood, full affect  Neuro- strength and sensation are intact  EKG- today with afib at 81 bpm, qrs int 148 ms, qtc 490 ms  EKG in sinus brady at 42 bpm, RBBB, pr int 206 ms, qrs int 146 and qtc 417 ms  Assessment and Plan:  1. Persistent afib  Rate controlled not on rate control meds  Sinus brady low 40's at baseline  Pt is here for Tikosyn admit  Off HCTZ since last Thursday.  With her bradycardia at baseline, she will have to be watched carefully in the future for possible PPM  No benadryl use  crcl cal is 92( on previous lab) so anticipate she would be able to start at 500 mcg of dofetilide bid  She will use [facility] on d/c to pay for drug  mag/K+ today, covid negative  2. CHA2DS2VASc score of at  least 5  Continue eliquis 5 mg bid, no missed doses for at least 3 weeks  3. CAD  No angina symptoms  Stent 04/2019  Continue brilinta, now off asa  To [place]. Mila Homer  [facility]  [address]  [place], [place] [address]  [phone]  EP Attending  Patient seen and examined. Agree with the findings as note above. The patient has had persistent atrial fib. I have discussed then indications/risk/benefits/goals/expectations of inpatient admission for dofetilide and she wishes to proceed. Her QT is acceptable. We will follow her electrolytes and QT intervals and renal function.   Cristopher Peru, MD.

## 2019-06-11 NOTE — Progress Notes (Addendum)
Primary Care Physician: Lawerance Cruel, MD Referring Physician: Dr. Jacalyn Lefevre is a 84 y.o. female with a h/o paroxysmal afib, CAD, s/p stent in 04/2019,  HTN that had a DCCV end of December, ERAF when seen by Dr. [name] 3/29. She  is here to discuss issues to restore SR as she feels poorly in Afib. She is rate controlled without rate control meds and when is SR had a HR in the low 40's with a RBBB. She tolerates the bradycardia without symptoms. Dr. [name] preferred not to start amiodarone for fear of worsening her bradycardia and felt  tikosyn may be the best option. Her qtc last ekg in SR was 417 ms. She is on eliquis without any missed doses for at least 3 weeks.  F/u afib clinic, 06/11/19. She  is here for tikosyn admit. She checked the price of the drug with her drug supplement program  and found she has poor  drug coverage for dofetilide. She plans to use [facility]. She  is very slow in SR with a RBBB, usually in the low 40's, which she tolerates,  better than rate controlled afib( no rate control meds on board) and will have to be followed carefully for bradycardia/PPM in the future. Choice of antiarrythmic was to avoid those  with rate slowing properties.  She has not missed any of her eliquis 5 mg bid with a CHA2DS2VASc score of at least 5 and stopped her HCTZ  3 days ago. No benadryl use. Qtc on previous ekg's in SR show acceptable qtc to start Tikosyn.   Today, she denies symptoms of palpitations, chest pain, shortness of breath, orthopnea, PND, lower extremity edema, dizziness, presyncope, syncope, or neurologic sequela. The patient is tolerating medications without difficulties and is otherwise without complaint today.   Past Medical History:  Diagnosis Date  . Arthritis   . Breast cancer ([place])   . Cancer ([place])    Breast- rt  . Cough with sputum 2016  . Elevated liver function tests 05/09/2013  . GERD (gastroesophageal reflux disease)    COSTOCHONDRITIS  . Heart murmur     . Hip pain 05/08/2014  . Hyperlipidemia   . Hypertension   . Lipoma    back of neck  . PONV (postoperative nausea and vomiting)    only after rt hip surgery  . Primary osteoarthritis of left knee 11/04/2015  . Primary osteoarthritis of right hip 07/30/2014   Past Surgical History:  Procedure Laterality Date  . ANKLE FRACTURE SURGERY Right 2013   RIGHT   . BREAST LUMPECTOMY Right 2011  . BREAST SURGERY  04/22/2009   Rt lumpectomy  . CARDIOVERSION N/A 02/19/2019   Procedure: CARDIOVERSION;  Surgeon: Dorothy Spark, MD;  Location: [facility] ENDOSCOPY;  Service: Cardiovascular;  Laterality: N/A;  . CORONARY ATHERECTOMY N/A 04/30/2019   Procedure: CORONARY ATHERECTOMY;  Surgeon: Belva Crome, MD;  Location: [place] CV LAB;  Service: Cardiovascular;  Laterality: N/A;  LAD  . CORONARY STENT INTERVENTION N/A 04/30/2019   Procedure: CORONARY STENT INTERVENTION;  Surgeon: Belva Crome, MD;  Location: [place] CV LAB;  Service: Cardiovascular;  Laterality: N/A;  DES TO PROX-MID LAD  . EYE SURGERY  2001   macular hole  . JOINT REPLACEMENT  2016  . LEFT HEART CATH AND CORONARY ANGIOGRAPHY N/A 04/30/2019   Procedure: LEFT HEART CATH AND CORONARY ANGIOGRAPHY;  Surgeon: Belva Crome, MD;  Location: [place] CV LAB;  Service: Cardiovascular;  Laterality: N/A;  . MIDDLE EAR SURGERY Left 08/18/2015   repair eardrum and reconstruction  . OVARY SURGERY  40 years ago   wedge  . TOTAL HIP ARTHROPLASTY Right 07/30/2014   Procedure: TOTAL HIP ARTHROPLASTY ANTERIOR APPROACH;  Surgeon: Melrose Nakayama, MD;  Location: [place];  Service: Orthopedics;  Laterality: Right;  . TOTAL KNEE ARTHROPLASTY Left 11/04/2015  . TOTAL KNEE ARTHROPLASTY Left 11/04/2015   Procedure: TOTAL KNEE ARTHROPLASTY;  Surgeon: Melrose Nakayama, MD;  Location: [place];  Service: Orthopedics;  Laterality: Left;  . TYMPANOPLASTY  30 years ago    Current Outpatient Medications  Medication Sig Dispense Refill  . acetaminophen  (TYLENOL) 650 MG CR tablet Take 1,300 mg by mouth every 8 (eight) hours as needed for pain.    . Ascorbic Acid (VITAMIN C) 1000 MG tablet Take 1,000 mg by mouth daily.     Marland Kitchen BIOTIN PO Take 10,000 Units by mouth daily with breakfast.     . cetirizine (ZYRTEC) 10 MG tablet Take 10 mg by mouth daily as needed for allergies.    . Cholecalciferol (VITAMIN D-3) 1000 UNITS CAPS Take 1,000 Units by mouth daily with breakfast.     . cyanocobalamin 1000 MCG tablet Take 1,000 mcg by mouth daily.     Marland Kitchen ELIQUIS 5 MG TABS tablet Take 5 mg by mouth 2 (two) times daily.    Marland Kitchen esomeprazole (NEXIUM) 20 MG capsule Take 20 mg by mouth daily at 12 noon.    . ferrous sulfate 325 (65 FE) MG tablet Take 325 mg by mouth daily with breakfast.    . hydrochlorothiazide (HYDRODIURIL) 25 MG tablet Take 0.5 tablets (12.5 mg total) by mouth daily. 45 tablet 3  . LORazepam (ATIVAN) 1 MG tablet Take 1 mg by mouth at bedtime.     Marland Kitchen losartan (COZAAR) 100 MG tablet Take 100 mg by mouth daily.     . Magnesium (CVS TRIPLE MAGNESIUM COMPLEX) 400 MG CAPS Take 400 mg by mouth daily.    Marland Kitchen oxybutynin (DITROPAN) 5 MG tablet Take 5 mg by mouth as needed for bladder spasms.     . potassium chloride (K-DUR) 10 MEQ tablet Taking two tablets by mouth daily    . prednisoLONE acetate (PRED FORTE) 1 % ophthalmic suspension Place 1 drop into both eyes daily as needed (Burning).     . pyridOXINE (VITAMIN B-6) 100 MG tablet Take 100 mg by mouth daily.    . rosuvastatin (CRESTOR) 40 MG tablet Take 1 tablet (40 mg total) by mouth daily at 6 PM. 30 tablet 6  . ticagrelor (BRILINTA) 90 MG TABS tablet Take 1 tablet (90 mg total) by mouth 2 (two) times daily. 60 tablet 11  . vitamin E 400 UNIT capsule Take 400 Units by mouth daily.     No current facility-administered medications for this encounter.    No Known Allergies  Social History   Socioeconomic History  . Marital status: Widowed    Spouse name: Not on file  . Number of children: Not on  file  . Years of education: Not on file  . Highest education level: Not on file  Occupational History  . Not on file  Tobacco Use  . Smoking status: Never Smoker  . Smokeless tobacco: Never Used  Substance and Sexual Activity  . Alcohol use: Not Currently    Comment: OCC -none now  . Drug use: No  . Sexual activity: Not on file  Other Topics Concern  . Not on  file  Social History Narrative  . Not on file   Social Determinants of Health   Financial Resource Strain:   . Difficulty of Paying Living Expenses:   Food Insecurity:   . Worried About Charity fundraiser in the Last Year:   . Arboriculturist in the Last Year:   Transportation Needs:   . Film/video editor (Medical):   Marland Kitchen Lack of Transportation (Non-Medical):   Physical Activity:   . Days of Exercise per Week:   . Minutes of Exercise per Session:   Stress:   . Feeling of Stress :   Social Connections:   . Frequency of Communication with Friends and Family:   . Frequency of Social Gatherings with Friends and Family:   . Attends Religious Services:   . Active Member of Clubs or Organizations:   . Attends Archivist Meetings:   Marland Kitchen Marital Status:   Intimate Partner Violence:   . Fear of Current or Ex-Partner:   . Emotionally Abused:   Marland Kitchen Physically Abused:   . Sexually Abused:     Family History  Problem Relation Age of Onset  . Heart disease Mother   . Cancer Brother        colon, pancreatic, brain    ROS- All systems are reviewed and negative except as per the HPI above  Physical Exam: There were no vitals filed for this visit. Wt Readings from Last 3 Encounters:  06/06/19 181 lb (82.1 kg)  06/04/19 182 lb 12.8 oz (82.9 kg)  05/31/19 176 lb (79.8 kg)    Labs: Lab Results  Component Value Date   NA 132 (L) 06/04/2019   K 4.1 06/04/2019   CL 94 (L) 06/04/2019   CO2 23 06/04/2019   GLUCOSE 119 (H) 06/04/2019   BUN 9 06/04/2019   CREATININE 0.61 06/04/2019   CALCIUM 9.9 06/04/2019    MG 2.1 06/06/2019   Lab Results  Component Value Date   INR 1.02 10/24/2015   No results found for: CHOL, HDL, LDLCALC, TRIG   GEN- The patient is well appearing, alert and oriented x 3 today.   Head- normocephalic, atraumatic Eyes-  Sclera clear, conjunctiva pink Ears- hearing intact Oropharynx- clear Neck- supple, no JVP Lymph- no cervical lymphadenopathy Lungs- Clear to ausculation bilaterally, normal work of breathing Heart- irregular rate and rhythm, no murmurs, rubs or gallops, PMI not laterally displaced GI- soft, NT, ND, + BS Extremities- no clubbing, cyanosis, or edema MS- no significant deformity or atrophy Skin- no rash or lesion Psych- euthymic mood, full affect Neuro- strength and sensation are intact  EKG- today with  afib at 81 bpm,   qrs int 148 ms, qtc 490 ms EKG in sinus brady at 42 bpm, RBBB, pr int 206 ms, qrs int 146 and qtc 417 ms    Assessment and Plan: 1. Persistent  afib Rate controlled not on rate control meds  Sinus brady low 40's at baseline Pt is here for Tikosyn admit Off HCTZ since last Thursday. With her bradycardia at baseline, she will have to be  watched carefully in the future for possible PPM No benadryl use  crcl cal is 92( on previous lab)  so anticipate she would be able to start at 500 mcg of dofetilide bid She will use [facility] on d/c  to pay for drug  mag/K+ today, covid negative   2. CHA2DS2VASc score of at least 5 Continue eliquis 5 mg bid, no  missed doses  for at least 3 weeks   3. CAD No angina symptoms Stent 04/2019  Continue brilinta, now off asa   To [place]. Mat Stuard, [facility] [address] [place], [place] [address] [phone]

## 2019-06-12 ENCOUNTER — Encounter (HOSPITAL_COMMUNITY): Payer: Self-pay | Admitting: Internal Medicine

## 2019-06-12 LAB — BASIC METABOLIC PANEL
Anion gap: 9 (ref 5–15)
BUN: 9 mg/dL (ref 8–23)
CO2: 28 mmol/L (ref 22–32)
Calcium: 9.4 mg/dL (ref 8.9–10.3)
Chloride: 100 mmol/L (ref 98–111)
Creatinine, Ser: 0.64 mg/dL (ref 0.44–1.00)
GFR calc Af Amer: >60 mL/min (ref >60)
GFR calc non Af Amer: >60 mL/min (ref >60)
Glucose, Bld: 141 mg/dL — ABNORMAL HIGH (ref 70–99)
Potassium: 4.1 mmol/L (ref 3.5–5.1)
Sodium: 137 mmol/L (ref 135–145)

## 2019-06-12 LAB — MAGNESIUM: Magnesium: 2.2 mg/dL (ref 1.7–2.4)

## 2019-06-12 MED ORDER — MAGNESIUM OXIDE 400 (241.3 MG) MG PO TABS
400.0000 mg | ORAL_TABLET | Freq: Every day | ORAL | Status: DC
  Administered 2019-06-12 – 2019-06-14 (×3): 400 mg via ORAL
  Filled 2019-06-12 (×3): qty 1

## 2019-06-12 MED ORDER — LOSARTAN POTASSIUM 50 MG PO TABS
100.0000 mg | ORAL_TABLET | Freq: Every day | ORAL | Status: DC
  Administered 2019-06-12 – 2019-06-14 (×3): 100 mg via ORAL
  Filled 2019-06-12 (×3): qty 2

## 2019-06-12 MED ORDER — PANTOPRAZOLE SODIUM 40 MG PO TBEC
40.0000 mg | DELAYED_RELEASE_TABLET | Freq: Every day | ORAL | Status: DC
  Administered 2019-06-12 – 2019-06-14 (×3): 40 mg via ORAL
  Filled 2019-06-12 (×3): qty 1

## 2019-06-12 MED ORDER — POTASSIUM CHLORIDE CRYS ER 10 MEQ PO TBCR
10.0000 meq | EXTENDED_RELEASE_TABLET | Freq: Every day | ORAL | Status: DC
  Administered 2019-06-12: 10 meq via ORAL
  Filled 2019-06-12: qty 1

## 2019-06-12 MED ORDER — FERROUS SULFATE 325 (65 FE) MG PO TABS
325.0000 mg | ORAL_TABLET | Freq: Every day | ORAL | Status: DC
  Administered 2019-06-12 – 2019-06-14 (×3): 325 mg via ORAL
  Filled 2019-06-12 (×3): qty 1

## 2019-06-12 NOTE — Progress Notes (Addendum)
Electrophysiology Rounding Note  Patient Name: Sheila Gardner Date of Encounter: 06/12/2019  Primary Cardiologist: Sinclair Grooms, MD  Electrophysiologist: None    Subjective   Pt converted to sinus rhythm on Tikosyn 500 mcg BID   QTc from EKG overnight after conversion shows  borderline QTc  at 520 (but 470 when corrected for QRS  The patient is doing well today.  At this time, the patient denies chest pain, shortness of breath, or any new concerns.  Inpatient Medications    Scheduled Meds:  apixaban  5 mg Oral BID   dofetilide  500 mcg Oral BID   ferrous sulfate  325 mg Oral Q breakfast   LORazepam  1 mg Oral QHS   losartan  100 mg Oral Daily   magnesium oxide  400 mg Oral Daily   pantoprazole  40 mg Oral Daily   potassium chloride  10 mEq Oral Daily   rosuvastatin  40 mg Oral q1800   sodium chloride flush  3 mL Intravenous Q12H   ticagrelor  90 mg Oral BID   Continuous Infusions:  sodium chloride     PRN Meds: sodium chloride, acetaminophen, sodium chloride flush   Vital Signs    Vitals:   06/11/19 1739 06/11/19 2003 06/12/19 0349 06/12/19 0836  BP: 121/68 120/74 122/74 (!) 133/59  Pulse: 62 64 68   Resp:  16 20   Temp: 98.8 F (37.1 C) 98.2 F (36.8 C) 98.4 F (36.9 C)   TempSrc: Oral Oral Oral   SpO2: 100%     Weight:   82 kg   Height:        Intake/Output Summary (Last 24 hours) at 06/12/2019 0920 Last data filed at 06/12/2019 0800 Gross per 24 hour  Intake 660 ml  Output --  Net 660 ml   Filed Weights   06/11/19 1342 06/12/19 0349  Weight: 82.1 kg 82 kg    Physical Exam    GEN- The patient is well appearing, alert and oriented x 3 today.   Head- normocephalic, atraumatic Eyes-  Sclera clear, conjunctiva pink Ears- hearing intact Oropharynx- clear Neck- supple Lungs- Clear to ausculation bilaterally, normal work of breathing Heart- Regular rate and rhythm, no murmurs, rubs or gallops GI- soft, NT, ND, + BS Extremities- no clubbing,  cyanosis, or edema Skin- no rash or lesion Psych- euthymic mood, full affect Neuro- strength and sensation are intact  Labs    CBC No results for input(s): WBC, NEUTROABS, HGB, HCT, MCV, PLT in the last 72 hours. Basic Metabolic Panel Recent Labs    06/11/19 1103 06/12/19 0330  NA 133* 137  K 4.4 4.1  CL 100 100  CO2 25 28  GLUCOSE 106* 141*  BUN 7* 9  CREATININE 0.68 0.64  CALCIUM 9.7 9.4  MG 2.2 2.2    Potassium  Date/Time Value Ref Range Status  06/12/2019 03:30 AM 4.1 3.5 - 5.1 mmol/L Final  05/04/2016 10:11 AM 4.2 3.5 - 5.1 mEq/L Final   Magnesium  Date/Time Value Ref Range Status  06/12/2019 03:30 AM 2.2 1.7 - 2.4 mg/dL Final    Comment:    Performed at [facility], [place] [address] [place], [place] [address]    Telemetry    Converted to NSR ~ 0100 last night (personally reviewed)  Radiology    No results found.   Patient Profile     Sheila Gardner is a 84 y.o. female with a past medical history significant  for persistent atrial fibrillation.  They were admitted for tikosyn load.   Assessment & Plan    Persistent atrial fibrillation Pt converted to sinus rhythm on Tikosyn 500 mcg BID. Continue same dose for now. QTc borderline at 520 (then corrected to 470 with QRS of 120). Adjust as needed after EKG this am.  Continue Eliquis Electrolytes stable.  CHA2DS2VASC is at least 6  Pt will not required DCCV.   For questions or updates, please contact [facility] Please consult [email or web address] for contact info under Cardiology/STEMI.  Signed, Shirley Friar, PA-C  06/12/2019, 9:20 AM   EP Attending  Patient seen and examined. Agree with the findings as noted above. The patient has reverted to NSR. Her QT is increased but still acceptable in the setting of a QRS of 170. I will continue dofetilide 500 q12 but I suspect she will need her dose reduced after next dose. If her QTC over 520, then we will reduce the dose of dofetilide to 250  q12. Follow electrolytes.   Mikle Bosworth.D.

## 2019-06-12 NOTE — Progress Notes (Signed)
Pharmacy: Dofetilide (Tikosyn) - Follow Up Assessment and Electrolyte Replacement  Pharmacy consulted to assist in monitoring and replacing electrolytes in this 84 y.o. female admitted on 06/11/2019 undergoing dofetilide initiation. First dofetilide dose: 06/11/19  Labs:    Component Value Date/Time   K 4.1 06/12/2019 0330   K 4.2 05/04/2016 1011   MG 2.2 06/12/2019 0330     Plan: Potassium: K >/= 4: No additional supplementation needed  Magnesium: Mg > 2: No additional supplementation needed   Patient has been continued on home dose of potassium chloride 15meq daily, at this time would continue with that dose at discharge.   Potassium chloride 10 mEq  daily  Thank you for allowing pharmacy to participate in this patient's care   Erin Hearing PharmD., BCPS Clinical Pharmacist 06/12/2019 7:35 AM

## 2019-06-12 NOTE — Progress Notes (Signed)
Pt had 2.8 sec pause and converted to SB in the 50's. Will obtain EKG to confirm. Jessie Foot, RN

## 2019-06-12 NOTE — Plan of Care (Signed)
°  Problem: Education: °Goal: Knowledge of disease or condition will improve °Outcome: Progressing °Goal: Understanding of medication regimen will improve °Outcome: Progressing °  °

## 2019-06-12 NOTE — Progress Notes (Addendum)
Morning EKG reviewed  Shows remains in NSR at 61 bpm with stable QTc at 457 ms.  Continue Tikosyn 500 mcg BID.   Pt will not require DCCV   Shirley Friar, [place]  Pager: [phone]  06/12/2019 11:42 AM   Mikle Bosworth.D.

## 2019-06-13 ENCOUNTER — Ambulatory Visit: Payer: Medicare Other | Admitting: Interventional Cardiology

## 2019-06-13 LAB — BASIC METABOLIC PANEL
Anion gap: 9 (ref 5–15)
BUN: 10 mg/dL (ref 8–23)
CO2: 28 mmol/L (ref 22–32)
Calcium: 9.1 mg/dL (ref 8.9–10.3)
Chloride: 100 mmol/L (ref 98–111)
Creatinine, Ser: 0.69 mg/dL (ref 0.44–1.00)
GFR calc Af Amer: >60 mL/min (ref >60)
GFR calc non Af Amer: >60 mL/min (ref >60)
Glucose, Bld: 110 mg/dL — ABNORMAL HIGH (ref 70–99)
Potassium: 3.8 mmol/L (ref 3.5–5.1)
Sodium: 137 mmol/L (ref 135–145)

## 2019-06-13 LAB — MAGNESIUM: Magnesium: 1.9 mg/dL (ref 1.7–2.4)

## 2019-06-13 MED ORDER — POTASSIUM CHLORIDE CRYS ER 20 MEQ PO TBCR
40.0000 meq | EXTENDED_RELEASE_TABLET | Freq: Once | ORAL | Status: AC
  Administered 2019-06-13: 40 meq via ORAL
  Filled 2019-06-13: qty 2

## 2019-06-13 MED ORDER — POTASSIUM CHLORIDE CRYS ER 10 MEQ PO TBCR
10.0000 meq | EXTENDED_RELEASE_TABLET | Freq: Every day | ORAL | Status: DC
  Administered 2019-06-14: 10 meq via ORAL
  Filled 2019-06-13: qty 1

## 2019-06-13 MED ORDER — MAGNESIUM SULFATE 2 GM/50ML IV SOLN
2.0000 g | Freq: Once | INTRAVENOUS | Status: AC
  Administered 2019-06-13: 2 g via INTRAVENOUS
  Filled 2019-06-13: qty 50

## 2019-06-13 MED ORDER — HYDROCORTISONE 1 % EX CREA
TOPICAL_CREAM | CUTANEOUS | Status: DC | PRN
  Administered 2019-06-13: 1 via TOPICAL
  Filled 2019-06-13: qty 28

## 2019-06-13 MED ORDER — POTASSIUM CHLORIDE CRYS ER 20 MEQ PO TBCR: 20.0000 meq | EXTENDED_RELEASE_TABLET | Freq: Once | ORAL | Status: DC

## 2019-06-13 NOTE — Care Management (Signed)
1441 06-13-19 Patient presented for persistent Atrial Fib. Initiated on Tikosyn. Patient will be using [facility] and wants prescription to be sent to [facility] for 30 day supply. Medication is in stock. Patient is aware that hospital no longer provides the 7 day supply. No further needs from Case Manager at this time. Bethena Roys, RN,BSN Case Manager

## 2019-06-13 NOTE — Progress Notes (Addendum)
Morning EKG reviewed  Shows remains in NSR/sinus bradycardia at 47 bpm with stable QTc at 460 ms.  Continue Tikosyn 500 mcg BID at this time.  Pt will not require DCCV   Shirley Friar, [place]  Pager: [phone]  06/13/2019 1:50 PM   Mikle Bosworth.D.

## 2019-06-13 NOTE — Progress Notes (Addendum)
Electrophysiology Rounding Note  Patient Name: Sheila Gardner Date of Encounter: 06/13/2019  Primary Cardiologist: Sinclair Grooms, MD  Electrophysiologist: None    Subjective   Pt  remains in NSR/sinus brady  on Tikosyn 500 mcg BID   QTc from EKG last pm shows stable QTc at 473 ms  The patient is doing well today.  At this time, the patient denies chest pain, shortness of breath, or any new concerns.  Inpatient Medications    Scheduled Meds:  apixaban  5 mg Oral BID   dofetilide  500 mcg Oral BID   ferrous sulfate  325 mg Oral Q breakfast   LORazepam  1 mg Oral QHS   losartan  100 mg Oral Daily   magnesium oxide  400 mg Oral Daily   pantoprazole  40 mg Oral Q1200   potassium chloride  10 mEq Oral Daily   rosuvastatin  40 mg Oral q1800   sodium chloride flush  3 mL Intravenous Q12H   ticagrelor  90 mg Oral BID   Continuous Infusions:  sodium chloride     PRN Meds: sodium chloride, acetaminophen, sodium chloride flush   Vital Signs    Vitals:   06/12/19 0836 06/12/19 1300 06/12/19 2038 06/13/19 0604  BP: (!) 133/59 133/60 130/60 (!) 130/58  Pulse:  (!) 51 (!) 53 (!) 54  Resp:  20 20 20   Temp:  97.9 F (36.6 C) (!) 97.4 F (36.3 C) (!) 97.5 F (36.4 C)  TempSrc:  Oral Oral Oral  SpO2:  99% 100% 99%  Weight:    80.6 kg  Height:        Intake/Output Summary (Last 24 hours) at 06/13/2019 0718 Last data filed at 06/12/2019 2250 Gross per 24 hour  Intake 603 ml  Output --  Net 603 ml   Filed Weights   06/11/19 1342 06/12/19 0349 06/13/19 0604  Weight: 82.1 kg 82 kg 80.6 kg    Physical Exam    GEN- The patient is well appearing, alert and oriented x 3 today.   Head- normocephalic, atraumatic Eyes-  Sclera clear, conjunctiva pink Ears- hearing intact Oropharynx- clear Neck- supple Lungs- Clear to ausculation bilaterally, normal work of breathing Heart- Regular rate and rhythm, no murmurs, rubs or gallops GI- soft, NT, ND, + BS Extremities- no  clubbing, cyanosis, or edema Skin- no rash or lesion Psych- euthymic mood, full affect Neuro- strength and sensation are intact  Labs    CBC No results for input(s): WBC, NEUTROABS, HGB, HCT, MCV, PLT in the last 72 hours. Basic Metabolic Panel Recent Labs    06/12/19 0330 06/13/19 0315  NA 137 137  K 4.1 3.8  CL 100 100  CO2 28 28  GLUCOSE 141* 110*  BUN 9 10  CREATININE 0.64 0.69  CALCIUM 9.4 9.1  MG 2.2 1.9    Potassium  Date/Time Value Ref Range Status  06/13/2019 03:15 AM 3.8 3.5 - 5.1 mmol/L Final  05/04/2016 10:11 AM 4.2 3.5 - 5.1 mEq/L Final   Magnesium  Date/Time Value Ref Range Status  06/13/2019 03:15 AM 1.9 1.7 - 2.4 mg/dL Final    Comment:    Performed at [facility], [place] [address], [place], [place] [address]    Telemetry    Sinus bradycardia 40-50s overnight (personally reviewed)  Radiology    No results found.   Patient Profile     NAYDENE LAGRANGE is a 84 y.o. female with a past medical history  significant for persistent atrial fibrillation.  They were admitted for tikosyn load.   Assessment & Plan    Persistent atrial fibrillation Pt converted to sinus rhythm on Tikosyn 500 mcg BID  Continue Eliquis K 3.8. Mg 1.9. Supp both. CHA2DS2VASC is at least 6  2. Bradycardia Follow.  Pt will not require DCCV.   For questions or updates, please contact [facility] Please consult [email or web address] for contact info under Cardiology/STEMI.  Signed, Shirley Friar, PA-C  06/13/2019, 7:18 AM   EP Attending  Patient seen and examined. Agree with above. The patient's QT interval remains satisfactory on 500 mcg q12. She will continue. Plan for DC home tomorrow if QT stable.   Mikle Bosworth.D.

## 2019-06-13 NOTE — Progress Notes (Signed)
Pharmacy: Dofetilide (Tikosyn) - Follow Up Assessment and Electrolyte Replacement  Pharmacy consulted to assist in monitoring and replacing electrolytes in this 84 y.o. female admitted on 06/11/2019 undergoing dofetilide initiation. First dofetilide dose: 06/11/19  Labs:    Component Value Date/Time   K 3.8 06/13/2019 0315   K 4.2 05/04/2016 1011   MG 1.9 06/13/2019 0315     Plan: Potassium: K 3.8-3.9:  Give KCl 40 mEq po x1   Magnesium: Mg 1.8-2: Give Mg 2 gm IV x1    Patient to be given extra 62meq of potassium this am. If potassium below goal tomorrow may consider increasing outpatient supplement to 78meq daily. Patient also started on mag ox supplements this admit.   Potassium chloride 10 mEq  daily  Thank you for allowing pharmacy to participate in this patient's care   Erin Hearing PharmD., BCPS Clinical Pharmacist 06/13/2019 7:22 AM

## 2019-06-14 ENCOUNTER — Telehealth (HOSPITAL_COMMUNITY): Payer: Self-pay

## 2019-06-14 ENCOUNTER — Encounter (HOSPITAL_COMMUNITY): Payer: Self-pay

## 2019-06-14 LAB — BASIC METABOLIC PANEL
Anion gap: 10 (ref 5–15)
Anion gap: 10 (ref 5–15)
BUN: 12 mg/dL (ref 8–23)
BUN: 13 mg/dL (ref 8–23)
CO2: 27 mmol/L (ref 22–32)
CO2: 28 mmol/L (ref 22–32)
Calcium: 9.1 mg/dL (ref 8.9–10.3)
Calcium: 9.4 mg/dL (ref 8.9–10.3)
Chloride: 99 mmol/L (ref 98–111)
Chloride: 97 mmol/L — ABNORMAL LOW (ref 98–111)
Creatinine, Ser: 0.61 mg/dL (ref 0.44–1.00)
Creatinine, Ser: 0.62 mg/dL (ref 0.44–1.00)
GFR calc Af Amer: >60 mL/min (ref >60)
GFR calc Af Amer: >60 mL/min (ref >60)
GFR calc non Af Amer: >60 mL/min (ref >60)
GFR calc non Af Amer: >60 mL/min (ref >60)
Glucose, Bld: 113 mg/dL — ABNORMAL HIGH (ref 70–99)
Glucose, Bld: 126 mg/dL — ABNORMAL HIGH (ref 70–99)
Potassium: 3.4 mmol/L — ABNORMAL LOW (ref 3.5–5.1)
Potassium: 3.9 mmol/L (ref 3.5–5.1)
Sodium: 136 mmol/L (ref 135–145)
Sodium: 135 mmol/L (ref 135–145)

## 2019-06-14 LAB — MAGNESIUM: Magnesium: 2.2 mg/dL (ref 1.7–2.4)

## 2019-06-14 MED ORDER — MAGNESIUM OXIDE 400 (241.3 MG) MG PO TABS: 400.0000 mg | ORAL_TABLET | Freq: Every day | ORAL | 6 refills | Status: DC

## 2019-06-14 MED ORDER — POTASSIUM CHLORIDE CRYS ER 10 MEQ PO TBCR: 40.0000 meq | EXTENDED_RELEASE_TABLET | Freq: Every day | ORAL | 6 refills | Status: DC

## 2019-06-14 MED ORDER — DOFETILIDE 500 MCG PO CAPS: 500.0000 ug | ORAL_CAPSULE | Freq: Two times a day (BID) | ORAL | 6 refills | Status: DC

## 2019-06-14 MED ORDER — POTASSIUM CHLORIDE CRYS ER 20 MEQ PO TBCR
60.0000 meq | EXTENDED_RELEASE_TABLET | Freq: Once | ORAL | Status: AC
  Administered 2019-06-14: 60 meq via ORAL
  Filled 2019-06-14: qty 3

## 2019-06-14 NOTE — Care Management Important Message (Signed)
Important Message  Patient Details  Name: Sheila Gardner MRN: [id number] Date of Birth: May 31, 1935   Medicare Important Message Given:  Yes     [name] 06/14/2019, 10:37 AM

## 2019-06-14 NOTE — Telephone Encounter (Signed)
Called patient to see if she was interested in participating in the Cardiac Rehab Program. Patient stated yes. Patient will come in for orientation on 07/03/2019@2 :00pm and will attend the 1:45pm exercise class.  Mailed homework package.

## 2019-06-14 NOTE — Progress Notes (Signed)
Pharmacy: Dofetilide (Tikosyn) - Follow Up Assessment and Electrolyte Replacement  Pharmacy consulted to assist in monitoring and replacing electrolytes in this 84 y.o. female admitted on 06/11/2019 undergoing dofetilide initiation. First dofetilide dose: 06/11/19  Labs:    Component Value Date/Time   K 3.4 (L) 06/14/2019 0523   K 4.2 05/04/2016 1011   MG 2.2 06/14/2019 0523     Plan: Potassium: K 3.4 - will give 18meq now then recheck prior to d/c  Magnesium: Mg > 2: No additional supplementation needed   Patient to be given 47meq of extra potassium this am.   Patient also started on mag ox supplements this admit, mag wnl this am  At this point would consider discharging patient on 68meq daily until seen in follow up given drop in potassium this admit.   Thank you for allowing pharmacy to participate in this patient's care   Erin Hearing PharmD., BCPS Clinical Pharmacist 06/14/2019 8:32 AM

## 2019-06-14 NOTE — Discharge Summary (Addendum)
ELECTROPHYSIOLOGY PROCEDURE DISCHARGE SUMMARY    Patient ID: Sheila Gardner,  MRN: [id number], DOB/AGE: 07-07-1935 84 y.o.  Admit date: 06/11/2019 Discharge date: 06/14/2019  Primary Care Physician: Lawerance Cruel, MD  Primary Cardiologist: Sinclair Grooms, MD  Electrophysiologist: None   Primary Discharge Diagnosis:  1.  Persistent atrial fibrillation status post Tikosyn loading this admission  Secondary Discharge Diagnosis:  Sinus bradycardia Hypokalemia  No Known Allergies   Procedures This Admission:  1.  Tikosyn loading  Brief HPI: Sheila Gardner is a 84 y.o. female with a past medical history as noted above.  They were referred to EP in the outpatient setting for treatment options of atrial fibrillation.  Risks, benefits, and alternatives to Tikosyn were reviewed with the patient who wished to proceed.    Hospital Course:  The patient was admitted and Tikosyn was initiated.  Renal function and electrolytes were followed during the hospitalization. Pt converted to NSR on Tikosyn without requiring DCCV. Their QTc remained stable.  They were monitored until discharge on telemetry which demonstrated conversion to NSR/sinus brady with HRs in 40-50s.  On the day of discharge, they were examined by Dr. Lovena Le  who considered them stable for discharge to home.  Follow-up has been arranged with the Atrial Fibrillation clinic in approximately 1 week and with Dr. Lovena Le  in 4 weeks.  K 3.4 on day of discharge. Supped and recheck 3.9. K supp increased for home and will be rechecked at week follow up.    Physical Exam: Vitals:   06/13/19 0604 06/13/19 1722 06/13/19 1951 06/14/19 0546  BP: (!) 130/58 (!) 148/55 (!) 148/65 132/65  Pulse: (!) 54 (!) 44 (!) 45 (!) 48  Resp: 20  20 20   Temp: (!) 97.5 F (36.4 C) 98.1 F (36.7 C) 98.1 F (36.7 C) 98.2 F (36.8 C)  TempSrc: Oral Oral Oral Oral  SpO2: 99% 100% 100% 100%  Weight: 80.6 kg   80 kg  Height:        GEN- The  patient is well appearing, alert and oriented x 3 today.   HEENT: normocephalic, atraumatic; sclera clear, conjunctiva pink; hearing intact; oropharynx clear; neck supple, no JVP Lymph- no cervical lymphadenopathy Lungs- Clear to ausculation bilaterally, normal work of breathing.  No wheezes, rales, rhonchi Heart- Regular rate and rhythm, no murmurs, rubs or gallops, PMI not laterally displaced GI- soft, non-tender, non-distended, bowel sounds present, no hepatosplenomegaly Extremities- no clubbing, cyanosis, or edema; DP/PT/radial pulses 2+ bilaterally MS- no significant deformity or atrophy Skin- warm and dry, no rash or lesion Psych- euthymic mood, full affect Neuro- strength and sensation are intact   Labs:   Lab Results  Component Value Date   WBC 9.6 06/04/2019   HGB 11.5 06/04/2019   HCT 34.1 06/04/2019   MCV 94 06/04/2019   PLT 337 06/04/2019    Recent Labs  Lab 06/14/19 1055  NA 135  K 3.9  CL 97*  CO2 28  BUN 13  CREATININE 0.62  CALCIUM 9.4  GLUCOSE 126*     Discharge Medications:  Allergies as of 06/14/2019   No Known Allergies     Medication List    STOP taking these medications   [facility] Triple Magnesium Complex 400 MG Caps Generic drug: Magnesium     TAKE these medications   acetaminophen 650 MG CR tablet Commonly known as: TYLENOL Take 1,300 mg by mouth every 8 (eight) hours as needed for pain.   BIOTIN PO  Take 10,000 Units by mouth daily with breakfast.   cyanocobalamin 1000 MCG tablet Take 1,000 mcg by mouth daily.   dofetilide 500 MCG capsule Commonly known as: TIKOSYN Take 1 capsule (500 mcg total) by mouth 2 (two) times daily.   Eliquis 5 MG Tabs tablet Generic drug: apixaban Take 5 mg by mouth 2 (two) times daily.   esomeprazole 20 MG capsule Commonly known as: NEXIUM Take 20 mg by mouth daily at 12 noon.   ferrous sulfate 325 (65 FE) MG tablet Take 325 mg by mouth daily with breakfast.   LORazepam 1 MG tablet Commonly known  as: ATIVAN Take 1 mg by mouth at bedtime.   losartan 100 MG tablet Commonly known as: COZAAR Take 100 mg by mouth daily.   magnesium oxide 400 (241.3 Mg) MG tablet Commonly known as: MAG-OX Take 1 tablet (400 mg total) by mouth daily. Start taking on: June 15, 2019   potassium chloride 10 MEQ tablet Commonly known as: KLOR-CON Take 4 tablets (40 mEq total) by mouth daily. What changed:   how much to take  how to take this  when to take this  additional instructions   prednisoLONE acetate 1 % ophthalmic suspension Commonly known as: PRED FORTE Place 1 drop into both eyes daily as needed (Burning).   pyridOXINE 100 MG tablet Commonly known as: VITAMIN B-6 Take 100 mg by mouth daily.   rosuvastatin 40 MG tablet Commonly known as: CRESTOR Take 1 tablet (40 mg total) by mouth daily at 6 PM.   ticagrelor 90 MG Tabs tablet Commonly known as: BRILINTA Take 1 tablet (90 mg total) by mouth 2 (two) times daily.   vitamin C 1000 MG tablet Take 1,000 mg by mouth daily.   Vitamin D-3 25 MCG (1000 UT) Caps Take 1,000 Units by mouth daily with breakfast.   vitamin E 180 MG (400 UNITS) capsule Take 400 Units by mouth daily.       Disposition:   Follow-up Information    Evans Lance, MD Follow up on 07/17/2019.   Specialty: Cardiology Why: at 1045 for 1 month post tikosyn follow up Contact information: [id number] [place] [address] [phone]        [facility] Follow up on 06/21/2019.   Specialty: Cardiology Why: at 1015 for 1 week post tikosyn follow up. Parking code [employer] information: [id number] [place] [phone]          Duration of Discharge Encounter: Greater than 30 minutes including physician time.  Jacalyn Lefevre, PA-C  06/14/2019 12:43 PM  EP Attending  Patient seen and examined. Agree with above. The patient is  stable for DC this a.m. her QT is satisfactory and she is maintaining NSR. She will be discharged home with usual followup.   Mikle Bosworth.D.

## 2019-06-14 NOTE — Progress Notes (Addendum)
  PM EKG reviewed  Shows remains in NSR/sinus brady at 45 bpm with stable QTc at approx 440 ms when corrected for QRS  Continue Tikosyn 500 mcg BID.   Potentially home today after am dose if QTc remains stable.    K 3.4. Supp ordered. Recheck prior to d.c.   Shirley Friar, PA-C  Pager: [phone]  06/14/2019 7:58 AM   Mikle Bosworth.D.

## 2019-06-14 NOTE — Progress Notes (Signed)
Morning EKG reviewed  Shows remains in NSR/sinus brady at 46 bpm with stable QTc at ~440 ms.  Continue Tikosyn 500 mcg BID.   Pt stable for discharge.   Shirley Friar, [place]  Pager: [phone]  06/14/2019 12:33 PM

## 2019-06-14 NOTE — Discharge Instructions (Signed)
Atrial Fibrillation  Atrial fibrillation is a type of heartbeat that is irregular or fast. If you have this condition, your heart beats without any order. This makes it hard for your heart to pump blood in a normal way. Atrial fibrillation may come and go, or it may become a long-lasting problem. If this condition is not treated, it can put you at higher risk for stroke, heart failure, and other heart problems. What are the causes? This condition may be caused by diseases that damage the heart. They include:  High blood pressure.  Heart failure.  Heart valve disease.  Heart surgery. Other causes include:  Diabetes.  Thyroid disease.  Being overweight.  Kidney disease. Sometimes the cause is not known. What increases the risk? You are more likely to develop this condition if:  You are older.  You smoke.  You exercise often and very hard.  You have a family history of this condition.  You are a man.  You use drugs.  You drink a lot of alcohol.  You have lung conditions, such as emphysema, pneumonia, or COPD.  You have sleep apnea. What are the signs or symptoms? Common symptoms of this condition include:  A feeling that your heart is beating very fast.  Chest pain or discomfort.  Feeling short of breath.  Suddenly feeling light-headed or weak.  Getting tired easily during activity.  Fainting.  Sweating. In some cases, there are no symptoms. How is this treated? Treatment for this condition depends on underlying conditions and how you feel when you have atrial fibrillation. They include:  Medicines to: ? Prevent blood clots. ? Treat heart rate or heart rhythm problems.  Using devices, such as a pacemaker, to correct heart rhythm problems.  Doing surgery to remove the part of the heart that sends bad signals.  Closing an area where clots can form in the heart (left atrial appendage). In some cases, your doctor will treat other underlying  conditions. Follow these instructions at home: Medicines  Take over-the-counter and prescription medicines only as told by your doctor.  Do not take any new medicines without first talking to your doctor.  If you are taking blood thinners: ? Talk with your doctor before you take any medicines that have aspirin or NSAIDs, such as ibuprofen, in them. ? Take your medicine exactly as told by your doctor. Take it at the same time each day. ? Avoid activities that could hurt or bruise you. Follow instructions about how to prevent falls. ? Wear a bracelet that says you are taking blood thinners. Or, carry a card that lists what medicines you take. Lifestyle      Do not use any products that have nicotine or tobacco in them. These include cigarettes, e-cigarettes, and chewing tobacco. If you need help quitting, ask your doctor.  Eat heart-healthy foods. Talk with your doctor about the right eating plan for you.  Exercise regularly as told by your doctor.  Do not drink alcohol.  Lose weight if you are overweight.  Do not use drugs, including cannabis. General instructions  If you have a condition that causes breathing to stop for a short period of time (apnea), treat it as told by your doctor.  Keep a healthy weight. Do not use diet pills unless your doctor says they are safe for you. Diet pills may make heart problems worse.  Keep all follow-up visits as told by your doctor. This is important. Contact a doctor if:  You notice a change   in the speed, rhythm, or strength of your heartbeat.  You are taking a blood-thinning medicine and you get more bruising.  You get tired more easily when you move or exercise.  You have a sudden change in weight. Get help right away if:   You have pain in your chest or your belly (abdomen).  You have trouble breathing.  You have side effects of blood thinners, such as blood in your vomit, poop (stool), or pee (urine), or bleeding that cannot  stop.  You have any signs of a stroke. "BE FAST" is an easy way to remember the main warning signs: ? B - Balance. Signs are dizziness, sudden trouble walking, or loss of balance. ? E - Eyes. Signs are trouble seeing or a change in how you see. ? F - Face. Signs are sudden weakness or loss of feeling in the face, or the face or eyelid drooping on one side. ? A - Arms. Signs are weakness or loss of feeling in an arm. This happens suddenly and usually on one side of the body. ? S - Speech. Signs are sudden trouble speaking, slurred speech, or trouble understanding what people say. ? T - Time. Time to call emergency services. Write down what time symptoms started.  You have other signs of a stroke, such as: ? A sudden, very bad headache with no known cause. ? Feeling like you may vomit (nausea). ? Vomiting. ? A seizure. These symptoms may be an emergency. Do not wait to see if the symptoms will go away. Get medical help right away. Call your local emergency services (911 in the [place] Do not drive yourself to the hospital. Summary  Atrial fibrillation is a type of heartbeat that is irregular or fast.  You are at higher risk of this condition if you smoke, are older, have diabetes, or are overweight.  Follow your doctor's instructions about medicines, diet, exercise, and follow-up visits.  Get help right away if you have signs or symptoms of a stroke.  Get help right away if you cannot catch your breath, or you have chest pain or discomfort. This information is not intended to replace advice given to you by your health care provider. Make sure you discuss any questions you have with your health care provider. Document Revised: 08/16/2018 Document Reviewed: 08/16/2018 Elsevier Patient Education  [place].  Dofetilide capsules What is this medicine? DOFETILIDE (doe FET il ide) is an antiarrhythmic drug. It helps make your heart beat regularly. This medicine also helps to slow  rapid heartbeats. This medicine may be used for other purposes; ask your health care provider or pharmacist if you have questions. COMMON BRAND NAME(S): Tikosyn What should I tell my health care provider before I take this medicine? They need to know if you have any of these conditions:  heart disease  history of irregular heartbeat  history of low levels of potassium or magnesium in the blood  kidney disease  liver disease  an unusual or allergic reaction to dofetilide, other medicines, foods, dyes, or preservatives  pregnant or trying to get pregnant  breast-feeding How should I use this medicine? Take this medicine by mouth with a glass of water. Follow the directions on the prescription label. Do not take with grapefruit juice. You can take it with or without food. If it upsets your stomach, take it with food. Take your medicine at regular intervals. Do not take it more often than directed. Do not stop taking except on  your doctor's advice. A special MedGuide will be given to you by the pharmacist with each prescription and refill. Be sure to read this information carefully each time. Talk to your pediatrician regarding the use of this medicine in children. Special care may be needed. Overdosage: If you think you have taken too much of this medicine contact a poison control center or emergency room at once. NOTE: This medicine is only for you. Do not share this medicine with others. What if I miss a dose? If you miss a dose, skip it. Take your next dose at the normal time. Do not take extra or 2 doses at the same time to make up for the missed dose. What may interact with this medicine? Do not take this medicine with any of the following medications:  cimetidine  cisapride  dolutegravir  dronedarone  hydrochlorothiazide  ketoconazole  megestrol  pimozide  prochlorperazine  thioridazine  trimethoprim  verapamil This medicine may also interact with the  following medications:  amiloride  cannabinoids  certain antibiotics like erythromycin or clarithromycin  certain antiviral medicines for HIV or hepatitis  certain medicines for depression, anxiety, or psychotic disorders  digoxin  diltiazem  grapefruit juice  metformin  nefazodone  other medicines that prolong the QT interval (an abnormal heart rhythm)  quinine  triamterene  zafirlukast  ziprasidone This list may not describe all possible interactions. Give your health care provider a list of all the medicines, herbs, non-prescription drugs, or dietary supplements you use. Also tell them if you smoke, drink alcohol, or use illegal drugs. Some items may interact with your medicine. What should I watch for while using this medicine? Your condition will be monitored carefully while you are receiving this medicine. What side effects may I notice from receiving this medicine? Side effects that you should report to your doctor or health care professional as soon as possible:  allergic reactions like skin rash, itching or hives, swelling of the face, lips, or tongue  breathing problems  chest pain or chest tightness  dizziness  signs and symptoms of a dangerous change in heartbeat or heart rhythm like chest pain; dizziness; fast or irregular heartbeat; palpitations; feeling faint or lightheaded, falls; breathing problems  signs and symptoms of electrolyte imbalance like severe diarrhea, unusual sweating, vomiting, loss of appetite, increased thirst  swelling of the ankles, legs, or feet  tingling, numbness in the hands or feet Side effects that usually do not require medical attention (report to your doctor or health care professional if they continue or are bothersome):  diarrhea  general ill feeling or flu-like symptoms  headache  nausea  trouble sleeping  stomach pain This list may not describe all possible side effects. Call your doctor for medical  advice about side effects. You may report side effects to FDA at 1-800-FDA-1088. Where should I keep my medicine? Keep out of the reach of children. Store at room temperature between 15 and 30 degrees C (59 and 86 degrees F). Throw away any unused medicine after the expiration date. NOTE: This sheet is a summary. It may not cover all possible information. If you have questions about this medicine, talk to your doctor, pharmacist, or health care provider.  2020 Elsevier/Gold Standard (2018-02-13 10:18:48)   Heart-Healthy Eating Plan Heart-healthy meal planning includes:  Eating less unhealthy fats.  Eating more healthy fats.  Making other changes in your diet. Talk with your doctor or a diet specialist (dietitian) to create an eating plan that is right for  you. What is my plan? Your doctor may recommend an eating plan that includes:  Total fat: ______% or less of total calories a day.  Saturated fat: ______% or less of total calories a day.  Cholesterol: less than _________mg a day. What are tips for following this plan? Cooking Avoid frying your food. Try to bake, boil, grill, or broil it instead. You can also reduce fat by:  Removing the skin from poultry.  Removing all visible fats from meats.  Steaming vegetables in water or broth. Meal planning   At meals, divide your plate into four equal parts: ? Fill one-half of your plate with vegetables and green salads. ? Fill one-fourth of your plate with whole grains. ? Fill one-fourth of your plate with lean protein foods.  Eat 4-5 servings of vegetables per day. A serving of vegetables is: ? 1 cup of raw or cooked vegetables. ? 2 cups of raw leafy greens.  Eat 4-5 servings of fruit per day. A serving of fruit is: ? 1 medium whole fruit. ?  cup of dried fruit. ?  cup of fresh, frozen, or canned fruit. ?  cup of 100% fruit juice.  Eat more foods that have soluble fiber. These are apples, broccoli, carrots, beans,  peas, and barley. Try to get 20-30 g of fiber per day.  Eat 4-5 servings of nuts, legumes, and seeds per week: ? 1 serving of dried beans or legumes equals  cup after being cooked. ? 1 serving of nuts is  cup. ? 1 serving of seeds equals 1 tablespoon. General information  Eat more home-cooked food. Eat less restaurant, buffet, and fast food.  Limit or avoid alcohol.  Limit foods that are high in starch and sugar.  Avoid fried foods.  Lose weight if you are overweight.  Keep track of how much salt (sodium) you eat. This is important if you have high blood pressure. Ask your doctor to tell you more about this.  Try to add vegetarian meals each week. Fats  Choose healthy fats. These include olive oil and canola oil, flaxseeds, walnuts, almonds, and seeds.  Eat more omega-3 fats. These include salmon, mackerel, sardines, tuna, flaxseed oil, and ground flaxseeds. Try to eat fish at least 2 times each week.  Check food labels. Avoid foods with trans fats or high amounts of saturated fat.  Limit saturated fats. ? These are often found in animal products, such as meats, butter, and cream. ? These are also found in plant foods, such as palm oil, palm kernel oil, and coconut oil.  Avoid foods with partially hydrogenated oils in them. These have trans fats. Examples are stick margarine, some tub margarines, cookies, crackers, and other baked goods. What foods can I eat? Fruits All fresh, canned (in natural juice), or frozen fruits. Vegetables Fresh or frozen vegetables (raw, steamed, roasted, or grilled). Green salads. Grains Most grains. Choose whole wheat and whole grains most of the time. Rice and pasta, including brown rice and pastas made with whole wheat. Meats and other proteins Lean, well-trimmed beef, veal, pork, and lamb. Chicken and [place] without skin. All fish and shellfish. Wild duck, rabbit, pheasant, and venison. Egg whites or low-cholesterol egg substitutes. Dried  beans, peas, lentils, and tofu. Seeds and most nuts. Dairy Low-fat or nonfat cheeses, including ricotta and mozzarella. Skim or 1% milk that is liquid, powdered, or evaporated. Buttermilk that is made with low-fat milk. Nonfat or low-fat yogurt. Fats and oils Non-hydrogenated (trans-free) margarines. Vegetable oils, including soybean,  sesame, sunflower, olive, peanut, safflower, corn, canola, and cottonseed. Salad dressings or mayonnaise made with a vegetable oil. Beverages Mineral water. Coffee and tea. Diet carbonated beverages. Sweets and desserts Sherbet, gelatin, and fruit ice. Small amounts of dark chocolate. Limit all sweets and desserts. Seasonings and condiments All seasonings and condiments. The items listed above may not be a complete list of foods and drinks you can eat. Contact a dietitian for more options. What foods should I avoid? Fruits Canned fruit in heavy syrup. Fruit in cream or butter sauce. Fried fruit. Limit coconut. Vegetables Vegetables cooked in cheese, cream, or butter sauce. Fried vegetables. Grains Breads that are made with saturated or trans fats, oils, or whole milk. Croissants. Sweet rolls. Donuts. High-fat crackers, such as cheese crackers. Meats and other proteins Fatty meats, such as hot dogs, ribs, sausage, bacon, rib-eye roast or steak. High-fat deli meats, such as salami and bologna. Caviar. Domestic duck and goose. Organ meats, such as liver. Dairy Cream, sour cream, cream cheese, and creamed cottage cheese. Whole-milk cheeses. Whole or 2% milk that is liquid, evaporated, or condensed. Whole buttermilk. Cream sauce or high-fat cheese sauce. Yogurt that is made from whole milk. Fats and oils Meat fat, or shortening. Cocoa butter, hydrogenated oils, palm oil, coconut oil, palm kernel oil. Solid fats and shortenings, including bacon fat, salt pork, lard, and butter. Nondairy cream substitutes. Salad dressings with cheese or sour cream. Beverages Regular  sodas and juice drinks with added sugar. Sweets and desserts Frosting. Pudding. Cookies. Cakes. Pies. Milk chocolate or white chocolate. Buttered syrups. Full-fat ice cream or ice cream drinks. The items listed above may not be a complete list of foods and drinks to avoid. Contact a dietitian for more information. Summary  Heart-healthy meal planning includes eating less unhealthy fats, eating more healthy fats, and making other changes in your diet.  Eat a balanced diet. This includes fruits and vegetables, low-fat or nonfat dairy, lean protein, nuts and legumes, whole grains, and heart-healthy oils and fats. This information is not intended to replace advice given to you by your health care provider. Make sure you discuss any questions you have with your health care provider. Document Revised: 04/28/2017 Document Reviewed: 04/01/2017 Elsevier Patient Education  2020 [employer].

## 2019-06-14 NOTE — Telephone Encounter (Signed)
Attempted to call patient in regards to Cardiac Rehab - LM on VM Mailed letter 

## 2019-06-15 ENCOUNTER — Other Ambulatory Visit: Payer: Self-pay

## 2019-06-15 ENCOUNTER — Telehealth: Payer: Self-pay | Admitting: Internal Medicine

## 2019-06-15 ENCOUNTER — Ambulatory Visit (HOSPITAL_COMMUNITY)
Admission: RE | Admit: 2019-06-15 | Discharge: 2019-06-15 | Disposition: A | Discharge status: Home / Self Care | Payer: Medicare Other | Source: Ambulatory Visit | Attending: Physician Assistant | Admitting: Physician Assistant

## 2019-06-15 VITALS — BP 134/72 | HR 43 | Ht 64.5 in | Wt 182.0 lb

## 2019-06-15 DIAGNOSIS — E785 Hyperlipidemia, unspecified: Secondary | ICD-10-CM | POA: Insufficient documentation

## 2019-06-15 DIAGNOSIS — Z79899 Other long term (current) drug therapy: Secondary | ICD-10-CM | POA: Insufficient documentation

## 2019-06-15 DIAGNOSIS — I4819 Other persistent atrial fibrillation: Secondary | ICD-10-CM

## 2019-06-15 DIAGNOSIS — I2510 Atherosclerotic heart disease of native coronary artery without angina pectoris: Secondary | ICD-10-CM | POA: Diagnosis not present

## 2019-06-15 DIAGNOSIS — I10 Essential (primary) hypertension: Secondary | ICD-10-CM | POA: Diagnosis not present

## 2019-06-15 DIAGNOSIS — R001 Bradycardia, unspecified: Secondary | ICD-10-CM | POA: Insufficient documentation

## 2019-06-15 DIAGNOSIS — I4510 Unspecified right bundle-branch block: Secondary | ICD-10-CM | POA: Insufficient documentation

## 2019-06-15 DIAGNOSIS — Z7901 Long term (current) use of anticoagulants: Secondary | ICD-10-CM | POA: Diagnosis not present

## 2019-06-15 DIAGNOSIS — Z955 Presence of coronary angioplasty implant and graft: Secondary | ICD-10-CM | POA: Insufficient documentation

## 2019-06-15 DIAGNOSIS — D6869 Other thrombophilia: Secondary | ICD-10-CM | POA: Diagnosis not present

## 2019-06-15 DIAGNOSIS — Z853 Personal history of malignant neoplasm of breast: Secondary | ICD-10-CM | POA: Diagnosis not present

## 2019-06-15 NOTE — Progress Notes (Signed)
Primary Care Physician: Lawerance Cruel, MD Referring Physician: Dr. [name]  Primary EP: Dr Learta Codding is a 84 y.o. female with a h/o paroxysmal afib, CAD, s/p stent in 04/2019,  HTN that had a DCCV end of December, ERAF when seen by Dr. [name] 3/29. She  is here to discuss issues to restore SR as she feels poorly in Afib. She is rate controlled without rate control meds and when is SR had a HR in the low 40's with a RBBB. She tolerates the bradycardia without symptoms. Dr. [name] preferred not to start amiodarone for fear of worsening her bradycardia and felt  tikosyn may be the best option. Her qtc last ekg in SR was 417 ms. She is on eliquis without any missed doses for at least 3 weeks.  F/u afib clinic, 06/11/19. She  is here for tikosyn admit. She checked the price of the drug with her drug supplement program  and found she has poor  drug coverage for dofetilide. She plans to use [facility]. She  is very slow in SR with a RBBB, usually in the low 40's, which she tolerates,  better than rate controlled afib( no rate control meds on board) and will have to be followed carefully for bradycardia/PPM in the future. Choice of antiarrythmic was to avoid those  with rate slowing properties.  She has not missed any of her eliquis 5 mg bid with a CHA2DS2VASc score of at least 5 and stopped her HCTZ  3 days ago. No benadryl use. Qtc on previous ekg's in SR show acceptable qtc to start Tikosyn.   Follow up in AF clinic 06/15/19. Patient reports that after lunch today, she had an episode where she felt poorly and "just had to lay down." She denies dizziness, presyncope, CP or SOB. Her BP machine did show and irregular heart beat. Her symptoms resolved prior to being seen here.   Today, she denies symptoms of palpitations, chest pain, shortness of breath, orthopnea, PND, lower extremity edema, dizziness, presyncope, syncope, or neurologic sequela. The patient is tolerating medications without difficulties  and is otherwise without complaint today.   Past Medical History:  Diagnosis Date  . Arthritis   . Breast cancer ([place])   . Cancer ([place])    Breast- rt  . Cough with sputum 2016  . Elevated liver function tests 05/09/2013  . GERD (gastroesophageal reflux disease)    COSTOCHONDRITIS  . Heart murmur   . Hip pain 05/08/2014  . Hyperlipidemia   . Hypertension   . Lipoma    back of neck  . PONV (postoperative nausea and vomiting)    only after rt hip surgery  . Primary osteoarthritis of left knee 11/04/2015  . Primary osteoarthritis of right hip 07/30/2014   Past Surgical History:  Procedure Laterality Date  . ANKLE FRACTURE SURGERY Right 2013   RIGHT   . BREAST LUMPECTOMY Right 2011  . BREAST SURGERY  04/22/2009   Rt lumpectomy  . CARDIOVERSION N/A 02/19/2019   Procedure: CARDIOVERSION;  Surgeon: Dorothy Spark, MD;  Location: [facility] ENDOSCOPY;  Service: Cardiovascular;  Laterality: N/A;  . CORONARY ATHERECTOMY N/A 04/30/2019   Procedure: CORONARY ATHERECTOMY;  Surgeon: Belva Crome, MD;  Location: [place] CV LAB;  Service: Cardiovascular;  Laterality: N/A;  LAD  . CORONARY STENT INTERVENTION N/A 04/30/2019   Procedure: CORONARY STENT INTERVENTION;  Surgeon: Belva Crome, MD;  Location: [place] CV LAB;  Service: Cardiovascular;  Laterality: N/A;  DES TO PROX-MID LAD  . EYE SURGERY  2001   macular hole  . JOINT REPLACEMENT  2016  . LEFT HEART CATH AND CORONARY ANGIOGRAPHY N/A 04/30/2019   Procedure: LEFT HEART CATH AND CORONARY ANGIOGRAPHY;  Surgeon: Belva Crome, MD;  Location: [place] CV LAB;  Service: Cardiovascular;  Laterality: N/A;  . MIDDLE EAR SURGERY Left 08/18/2015   repair eardrum and reconstruction  . OVARY SURGERY  40 years ago   wedge  . TOTAL HIP ARTHROPLASTY Right 07/30/2014   Procedure: TOTAL HIP ARTHROPLASTY ANTERIOR APPROACH;  Surgeon: Melrose Nakayama, MD;  Location: [place];  Service: Orthopedics;  Laterality: Right;  . TOTAL KNEE ARTHROPLASTY Left  11/04/2015  . TOTAL KNEE ARTHROPLASTY Left 11/04/2015   Procedure: TOTAL KNEE ARTHROPLASTY;  Surgeon: Melrose Nakayama, MD;  Location: [place];  Service: Orthopedics;  Laterality: Left;  . TYMPANOPLASTY  30 years ago    Current Outpatient Medications  Medication Sig Dispense Refill  . acetaminophen (TYLENOL) 650 MG CR tablet Take 1,300 mg by mouth every 8 (eight) hours as needed for pain.    . Ascorbic Acid (VITAMIN C) 1000 MG tablet Take 1,000 mg by mouth daily.     Marland Kitchen BIOTIN PO Take 10,000 Units by mouth daily with breakfast.     . Cholecalciferol (VITAMIN D-3) 1000 UNITS CAPS Take 1,000 Units by mouth daily with breakfast.     . cyanocobalamin 1000 MCG tablet Take 1,000 mcg by mouth daily.     Marland Kitchen dofetilide (TIKOSYN) 500 MCG capsule Take 1 capsule (500 mcg total) by mouth 2 (two) times daily. 60 capsule 6  . ELIQUIS 5 MG TABS tablet Take 5 mg by mouth 2 (two) times daily.    Marland Kitchen esomeprazole (NEXIUM) 20 MG capsule Take 20 mg by mouth daily at 12 noon.    . ferrous sulfate 325 (65 FE) MG tablet Take 325 mg by mouth daily with breakfast.    . LORazepam (ATIVAN) 1 MG tablet Take 1 mg by mouth at bedtime.     Marland Kitchen losartan (COZAAR) 100 MG tablet Take 100 mg by mouth daily.     . magnesium oxide (MAG-OX) 400 (241.3 Mg) MG tablet Take 1 tablet (400 mg total) by mouth daily. 30 tablet 6  . potassium chloride (KLOR-CON) 10 MEQ tablet Take 4 tablets (40 mEq total) by mouth daily. 120 tablet 6  . prednisoLONE acetate (PRED FORTE) 1 % ophthalmic suspension Place 1 drop into both eyes daily as needed (Burning).     . pyridOXINE (VITAMIN B-6) 100 MG tablet Take 100 mg by mouth daily.    . rosuvastatin (CRESTOR) 40 MG tablet Take 1 tablet (40 mg total) by mouth daily at 6 PM. 30 tablet 6  . ticagrelor (BRILINTA) 90 MG TABS tablet Take 1 tablet (90 mg total) by mouth 2 (two) times daily. 60 tablet 11  . vitamin E 400 UNIT capsule Take 400 Units by mouth daily.     No current facility-administered medications for  this encounter.    No Known Allergies  Social History   Socioeconomic History  . Marital status: Widowed    Spouse name: Not on file  . Number of children: Not on file  . Years of education: Not on file  . Highest education level: Not on file  Occupational History  . Not on file  Tobacco Use  . Smoking status: Never Smoker  . Smokeless tobacco: Never Used  Substance and Sexual Activity  . Alcohol use: Not Currently  Comment: OCC -none now  . Drug use: No  . Sexual activity: Not on file  Other Topics Concern  . Not on file  Social History Narrative  . Not on file   Social Determinants of Health   Financial Resource Strain:   . Difficulty of Paying Living Expenses:   Food Insecurity:   . Worried About Charity fundraiser in the Last Year:   . Arboriculturist in the Last Year:   Transportation Needs:   . Film/video editor (Medical):   Marland Kitchen Lack of Transportation (Non-Medical):   Physical Activity:   . Days of Exercise per Week:   . Minutes of Exercise per Session:   Stress:   . Feeling of Stress :   Social Connections:   . Frequency of Communication with Friends and Family:   . Frequency of Social Gatherings with Friends and Family:   . Attends Religious Services:   . Active Member of Clubs or Organizations:   . Attends Archivist Meetings:   Marland Kitchen Marital Status:   Intimate Partner Violence:   . Fear of Current or Ex-Partner:   . Emotionally Abused:   Marland Kitchen Physically Abused:   . Sexually Abused:     Family History  Problem Relation Age of Onset  . Heart disease Mother   . Cancer Brother        colon, pancreatic, brain    ROS- All systems are reviewed and negative except as per the HPI above  Physical Exam: Vitals:   06/15/19 1433  BP: 134/72  Pulse: (!) 43  SpO2: 100%  Weight: 82.6 kg  Height: 5' 4.5" (1.638 m)   Wt Readings from Last 3 Encounters:  06/15/19 82.6 kg  06/14/19 80 kg  06/11/19 81.4 kg    Labs: Lab Results    Component Value Date   NA 135 06/14/2019   K 3.9 06/14/2019   CL 97 (L) 06/14/2019   CO2 28 06/14/2019   GLUCOSE 126 (H) 06/14/2019   BUN 13 06/14/2019   CREATININE 0.62 06/14/2019   CALCIUM 9.4 06/14/2019   MG 2.2 06/14/2019   Lab Results  Component Value Date   INR 1.02 10/24/2015   No results found for: CHOL, HDL, LDLCALC, TRIG   GEN- The patient is well appearing obese elderly female, alert and oriented x 3 today.   HEENT-head normocephalic, atraumatic, sclera clear, conjunctiva pink, hearing intact, trachea midline. Lungs- Clear to ausculation bilaterally, normal work of breathing Heart- Regular rate and rhythm, bradycardia, no murmurs, rubs or gallops  GI- soft, NT, ND, + BS Extremities- no clubbing, cyanosis, or edema MS- no significant deformity or atrophy Skin- no rash or lesion Psych- euthymic mood, full affect Neuro- strength and sensation are intact   EKG- today SB HR 43, RBBB, 1st degree AV block, PR 220, QRS 144, QTc 459   Assessment and Plan: 1. Persistent  afib S/p dofetilide loading She is in sinus rhythm today. Possible she had brief paroxysm of afib? Continue dofetilide 500 mcg BID. QT stable. Continue Eliquis 5 mg BID  2. CHA2DS2VASc score of at least 5 Continue eliquis 5 mg BID   3. CAD Stent 04/2019  No anginal symptoms.  4. Bradycardia HR in the 40s during her admission. No rate controlling medications. Continue to monitor. I have asked her to get a pulse oximeter to track her pulse rate at home and during activity. ? PPM.   Follow up with Roderic Palau as scheduled.  [facility] [address] [place], [place] [address] [phone]

## 2019-06-15 NOTE — Telephone Encounter (Signed)
Talked with patient - symptoms started after lunch. HR in the 40s BP 139/70. Shortness of breath noted. Will bring in for assessment this afternoon. Pt in agreement.

## 2019-06-15 NOTE — Telephone Encounter (Signed)
   Pt is calling, she said she was admitted in the hospital and just got home yesterday. Today after taking her BP meds at lunch her HR dropped and she feels like she is on Afib again and her monitor said irregular heartbeat.  Please call

## 2019-06-16 ENCOUNTER — Other Ambulatory Visit: Payer: Self-pay

## 2019-06-16 ENCOUNTER — Encounter (HOSPITAL_COMMUNITY): Payer: Self-pay | Admitting: *Deleted

## 2019-06-16 ENCOUNTER — Ambulatory Visit (HOSPITAL_COMMUNITY)
Admission: EM | Admit: 2019-06-16 | Discharge: 2019-06-16 | Disposition: A | Discharge status: Home / Self Care | Payer: Medicare Other | Attending: Family Medicine | Admitting: Family Medicine

## 2019-06-16 DIAGNOSIS — B029 Zoster without complications: Secondary | ICD-10-CM | POA: Diagnosis not present

## 2019-06-16 HISTORY — DX: Unspecified atrial fibrillation: I48.91

## 2019-06-16 NOTE — Discharge Instructions (Signed)
You may use over the counter capsaicin cream or gel topically.

## 2019-06-16 NOTE — ED Triage Notes (Signed)
Reports getting out of hospital for "heart issues" 2 days ago.  Reports trying on a blouse 2 wks ago when she noticed a discomfort to right base of neck; that day, a lesion appeared.  States lesion continues to be very painful; also has pain posterior to lesion without any lesions.  C/O right lateral neck pain.  Has been applying rubbing alcohol.  States providers in hospital prescribed hydrocortisone for the pruritis; denies any relief.

## 2019-06-18 NOTE — ED Provider Notes (Signed)
[place]   [id number] 06/16/19 Arrival Time: [id number]  ASSESSMENT & PLAN:  1. Herpes zoster without complication     Should be seeing improvement soon. No indication for Valtrex discussed.  No signs of infection.   Discharge Instructions     You may use over the counter capsaicin cream or gel topically.     Will follow up with PCP or here if worsening or failing to improve as anticipated. Reviewed expectations re: course of current medical issues. Questions answered. Outlined signs and symptoms indicating need for more acute intervention. Patient verbalized understanding. After Visit Summary given.   SUBJECTIVE:  Sheila Gardner is a 84 y.o. female who presents with a skin complaint. R neck: 2 weeks; area is painful. No spread. Afebrile. In hospital recently; rash present at that time. OTC hydrocortisone without much relief.    OBJECTIVE: Vitals:   06/16/19 1202  BP: (!) 145/63  Pulse: (!) 53  Resp: 18  Temp: 98.1 F (36.7 C)  TempSrc: Oral  SpO2: 97%    General appearance: alert; no distress HEENT: [place]; AT Neck: supple with FROM Lungs: speaks full sentences without difficulty Extremities: no edema; moves all extremities normally Skin: warm and dry; signs of infection: no; small crop of red/purplish papules/vesicles over R lower anterior neck; some crusting Psychological: alert and cooperative; normal mood and affect  No Known Allergies  Past Medical History:  Diagnosis Date  . Arthritis   . Atrial fibrillation ([place])   . Breast cancer ([place])   . Cancer ([place])    Breast- rt  . Cough with sputum 2016  . Elevated liver function tests 05/09/2013  . GERD (gastroesophageal reflux disease)    COSTOCHONDRITIS  . Heart murmur   . Hip pain 05/08/2014  . Hyperlipidemia   . Hypertension   . Lipoma    back of neck  . PONV (postoperative nausea and vomiting)    only after rt hip surgery  . Primary osteoarthritis of left knee 11/04/2015  . Primary  osteoarthritis of right hip 07/30/2014   Social History   Socioeconomic History  . Marital status: Widowed    Spouse name: Not on file  . Number of children: Not on file  . Years of education: Not on file  . Highest education level: Not on file  Occupational History  . Not on file  Tobacco Use  . Smoking status: Never Smoker  . Smokeless tobacco: Never Used  Substance and Sexual Activity  . Alcohol use: Not Currently  . Drug use: No  . Sexual activity: Not on file  Other Topics Concern  . Not on file  Social History Narrative  . Not on file   Social Determinants of Health   Financial Resource Strain:   . Difficulty of Paying Living Expenses:   Food Insecurity:   . Worried About Charity fundraiser in the Last Year:   . Arboriculturist in the Last Year:   Transportation Needs:   . Film/video editor (Medical):   Marland Kitchen Lack of Transportation (Non-Medical):   Physical Activity:   . Days of Exercise per Week:   . Minutes of Exercise per Session:   Stress:   . Feeling of Stress :   Social Connections:   . Frequency of Communication with Friends and Family:   . Frequency of Social Gatherings with Friends and Family:   . Attends Religious Services:   . Active Member of Clubs or Organizations:   . Attends Club  or Organization Meetings:   Marland Kitchen Marital Status:   Intimate Partner Violence:   . Fear of Current or Ex-Partner:   . Emotionally Abused:   Marland Kitchen Physically Abused:   . Sexually Abused:    Family History  Problem Relation Age of Onset  . Heart disease Mother   . Cancer Brother        colon, pancreatic, brain   Past Surgical History:  Procedure Laterality Date  . ANKLE FRACTURE SURGERY Right 2013   RIGHT   . BREAST LUMPECTOMY Right 2011  . BREAST SURGERY  04/22/2009   Rt lumpectomy  . CARDIOVERSION N/A 02/19/2019   Procedure: CARDIOVERSION;  Surgeon: Dorothy Spark, MD;  Location: [facility] ENDOSCOPY;  Service: Cardiovascular;  Laterality: N/A;  . CORONARY  ATHERECTOMY N/A 04/30/2019   Procedure: CORONARY ATHERECTOMY;  Surgeon: Belva Crome, MD;  Location: [place] CV LAB;  Service: Cardiovascular;  Laterality: N/A;  LAD  . CORONARY STENT INTERVENTION N/A 04/30/2019   Procedure: CORONARY STENT INTERVENTION;  Surgeon: Belva Crome, MD;  Location: [place] CV LAB;  Service: Cardiovascular;  Laterality: N/A;  DES TO PROX-MID LAD  . EYE SURGERY  2001   macular hole  . JOINT REPLACEMENT  2016  . KNEE SURGERY    . LEFT HEART CATH AND CORONARY ANGIOGRAPHY N/A 04/30/2019   Procedure: LEFT HEART CATH AND CORONARY ANGIOGRAPHY;  Surgeon: Belva Crome, MD;  Location: [place] CV LAB;  Service: Cardiovascular;  Laterality: N/A;  . MIDDLE EAR SURGERY Left 08/18/2015   repair eardrum and reconstruction  . OVARY SURGERY  40 years ago   wedge  . TOTAL HIP ARTHROPLASTY Right 07/30/2014   Procedure: TOTAL HIP ARTHROPLASTY ANTERIOR APPROACH;  Surgeon: Melrose Nakayama, MD;  Location: [place];  Service: Orthopedics;  Laterality: Right;  . TOTAL KNEE ARTHROPLASTY Left 11/04/2015  . TOTAL KNEE ARTHROPLASTY Left 11/04/2015   Procedure: TOTAL KNEE ARTHROPLASTY;  Surgeon: Melrose Nakayama, MD;  Location: [place];  Service: Orthopedics;  Laterality: Left;  . TYMPANOPLASTY  30 years ago     Vanessa Kick, MD 06/18/19 [phone]

## 2019-06-20 DIAGNOSIS — B029 Zoster without complications: Secondary | ICD-10-CM | POA: Diagnosis not present

## 2019-06-20 DIAGNOSIS — E876 Hypokalemia: Secondary | ICD-10-CM | POA: Diagnosis not present

## 2019-06-20 DIAGNOSIS — R001 Bradycardia, unspecified: Secondary | ICD-10-CM | POA: Diagnosis not present

## 2019-06-20 DIAGNOSIS — I4811 Longstanding persistent atrial fibrillation: Secondary | ICD-10-CM | POA: Diagnosis not present

## 2019-06-20 DIAGNOSIS — Z09 Encounter for follow-up examination after completed treatment for conditions other than malignant neoplasm: Secondary | ICD-10-CM | POA: Diagnosis not present

## 2019-06-20 DIAGNOSIS — D649 Anemia, unspecified: Secondary | ICD-10-CM | POA: Diagnosis not present

## 2019-06-20 DIAGNOSIS — L989 Disorder of the skin and subcutaneous tissue, unspecified: Secondary | ICD-10-CM | POA: Diagnosis not present

## 2019-06-20 DIAGNOSIS — D6869 Other thrombophilia: Secondary | ICD-10-CM | POA: Diagnosis not present

## 2019-06-21 ENCOUNTER — Encounter (HOSPITAL_COMMUNITY): Payer: Self-pay | Admitting: Nurse Practitioner

## 2019-06-21 ENCOUNTER — Ambulatory Visit (HOSPITAL_COMMUNITY)
Admission: RE | Admit: 2019-06-21 | Discharge: 2019-06-21 | Disposition: A | Discharge status: Home / Self Care | Payer: Medicare Other | Source: Ambulatory Visit | Attending: Nurse Practitioner | Admitting: Nurse Practitioner

## 2019-06-21 ENCOUNTER — Other Ambulatory Visit: Payer: Self-pay

## 2019-06-21 VITALS — BP 148/58 | HR 43 | Ht 64.5 in | Wt 180.8 lb

## 2019-06-21 DIAGNOSIS — E785 Hyperlipidemia, unspecified: Secondary | ICD-10-CM | POA: Diagnosis not present

## 2019-06-21 DIAGNOSIS — Z8249 Family history of ischemic heart disease and other diseases of the circulatory system: Secondary | ICD-10-CM | POA: Insufficient documentation

## 2019-06-21 DIAGNOSIS — Z7901 Long term (current) use of anticoagulants: Secondary | ICD-10-CM | POA: Diagnosis not present

## 2019-06-21 DIAGNOSIS — I4510 Unspecified right bundle-branch block: Secondary | ICD-10-CM | POA: Insufficient documentation

## 2019-06-21 DIAGNOSIS — I4819 Other persistent atrial fibrillation: Secondary | ICD-10-CM | POA: Insufficient documentation

## 2019-06-21 DIAGNOSIS — Z955 Presence of coronary angioplasty implant and graft: Secondary | ICD-10-CM | POA: Diagnosis not present

## 2019-06-21 DIAGNOSIS — I2510 Atherosclerotic heart disease of native coronary artery without angina pectoris: Secondary | ICD-10-CM | POA: Diagnosis not present

## 2019-06-21 DIAGNOSIS — Z853 Personal history of malignant neoplasm of breast: Secondary | ICD-10-CM | POA: Insufficient documentation

## 2019-06-21 DIAGNOSIS — K219 Gastro-esophageal reflux disease without esophagitis: Secondary | ICD-10-CM | POA: Diagnosis not present

## 2019-06-21 DIAGNOSIS — D6869 Other thrombophilia: Secondary | ICD-10-CM | POA: Diagnosis not present

## 2019-06-21 DIAGNOSIS — I10 Essential (primary) hypertension: Secondary | ICD-10-CM | POA: Insufficient documentation

## 2019-06-21 DIAGNOSIS — Z79899 Other long term (current) drug therapy: Secondary | ICD-10-CM | POA: Insufficient documentation

## 2019-06-21 LAB — BASIC METABOLIC PANEL
Anion gap: 9 (ref 5–15)
BUN: 9 mg/dL (ref 8–23)
CO2: 25 mmol/L (ref 22–32)
Calcium: 9.6 mg/dL (ref 8.9–10.3)
Chloride: 102 mmol/L (ref 98–111)
Creatinine, Ser: 0.54 mg/dL (ref 0.44–1.00)
GFR calc Af Amer: >60 mL/min (ref >60)
GFR calc non Af Amer: >60 mL/min (ref >60)
Glucose, Bld: 94 mg/dL (ref 70–99)
Potassium: 4.6 mmol/L (ref 3.5–5.1)
Sodium: 136 mmol/L (ref 135–145)

## 2019-06-21 LAB — MAGNESIUM: Magnesium: 2.2 mg/dL (ref 1.7–2.4)

## 2019-06-21 NOTE — Progress Notes (Signed)
Primary Care Physician: Lawerance Cruel, MD Referring Physician: Dr. Jacalyn Lefevre is a 84 y.o. female with a h/o paroxysmal afib, CAD, s/p stent in 04/2019,  HTN that had a DCCV end of December, ERAF when seen by Dr. [name] 3/29. She  is here to discuss issues to restore SR as she feels poorly in Afib. She is rate controlled without rate control meds and when is SR had a HR in the low 40's with a RBBB. She tolerates the bradycardia without symptoms. Dr. [name] preferred not to start amiodarone for fear of worsening her bradycardia and felt  tikosyn may be the best option. Her qtc last ekg in SR was 417 ms. She is on eliquis without any missed doses for at least 3 weeks.  F/u afib clinic, 06/11/19. She  is here for tikosyn admit. She checked the price of the drug with her drug supplement program  and found she has poor  drug coverage for dofetilide. She plans to use [facility]. She  is very slow in SR with a RBBB, usually in the low 40's, which she tolerates,  better than rate controlled afib( no rate control meds on board) and will have to be followed carefully for bradycardia/PPM in the future. Choice of antiarrythmic was to avoid those  with rate slowing properties.  She has not missed any of her eliquis 5 mg bid with a CHA2DS2VASc score of at least 5 and stopped her HCTZ  3 days ago. No benadryl use. Qtc on previous ekg's in SR show acceptable qtc to start Tikosyn.   Today, she denies symptoms of palpitations, chest pain, shortness of breath, orthopnea, PND, lower extremity edema, dizziness, presyncope, syncope, or neurologic sequela. The patient is tolerating medications without difficulties and is otherwise without complaint today.   Past Medical History:  Diagnosis Date  . Arthritis   . Atrial fibrillation ([place])   . Breast cancer ([place])   . Cancer ([place])    Breast- rt  . Cough with sputum 2016  . Elevated liver function tests 05/09/2013  . GERD (gastroesophageal reflux disease)    COSTOCHONDRITIS  . Heart murmur   . Hip pain 05/08/2014  . Hyperlipidemia   . Hypertension   . Lipoma    back of neck  . PONV (postoperative nausea and vomiting)    only after rt hip surgery  . Primary osteoarthritis of left knee 11/04/2015  . Primary osteoarthritis of right hip 07/30/2014   Past Surgical History:  Procedure Laterality Date  . ANKLE FRACTURE SURGERY Right 2013   RIGHT   . BREAST LUMPECTOMY Right 2011  . BREAST SURGERY  04/22/2009   Rt lumpectomy  . CARDIOVERSION N/A 02/19/2019   Procedure: CARDIOVERSION;  Surgeon: Dorothy Spark, MD;  Location: [facility];  Service: Cardiovascular;  Laterality: N/A;  . CORONARY ATHERECTOMY N/A 04/30/2019   Procedure: CORONARY ATHERECTOMY;  Surgeon: Belva Crome, MD;  Location: [facility];  Service: Cardiovascular;  Laterality: N/A;  LAD  . CORONARY STENT INTERVENTION N/A 04/30/2019   Procedure: CORONARY STENT INTERVENTION;  Surgeon: Belva Crome, MD;  Location: [facility];  Service: Cardiovascular;  Laterality: N/A;  DES TO PROX-MID LAD  . EYE SURGERY  2001   macular hole  . JOINT REPLACEMENT  2016  . KNEE SURGERY    . LEFT HEART CATH AND CORONARY ANGIOGRAPHY N/A 04/30/2019   Procedure: LEFT HEART CATH AND CORONARY ANGIOGRAPHY;  Surgeon: Belva Crome,  MD;  Location: [facility];  Service: Cardiovascular;  Laterality: N/A;  . MIDDLE EAR SURGERY Left 08/18/2015   repair eardrum and reconstruction  . OVARY SURGERY  40 years ago   wedge  . TOTAL HIP ARTHROPLASTY Right 07/30/2014   Procedure: TOTAL HIP ARTHROPLASTY ANTERIOR APPROACH;  Surgeon: Melrose Nakayama, MD;  Location: [facility];  Service: Orthopedics;  Laterality: Right;  . TOTAL KNEE ARTHROPLASTY Left 11/04/2015  . TOTAL KNEE ARTHROPLASTY Left 11/04/2015   Procedure: TOTAL KNEE ARTHROPLASTY;  Surgeon: Melrose Nakayama, MD;  Location: [facility];  Service: Orthopedics;  Laterality: Left;  . TYMPANOPLASTY  30 years ago    Current Outpatient Medications    Medication Sig Dispense Refill  . acetaminophen (TYLENOL) 650 MG CR tablet Take 1,300 mg by mouth every 8 (eight) hours as needed for pain.    . Ascorbic Acid (VITAMIN C) 1000 MG tablet Take 1,000 mg by mouth daily.     Marland Kitchen BIOTIN PO Take 10,000 Units by mouth daily with breakfast.     . Cholecalciferol (VITAMIN D-3) 1000 UNITS CAPS Take 1,000 Units by mouth daily with breakfast.     . cyanocobalamin 1000 MCG tablet Take 1,000 mcg by mouth daily.     Marland Kitchen dofetilide (TIKOSYN) 500 MCG capsule Take 1 capsule (500 mcg total) by mouth 2 (two) times daily. 60 capsule 6  . ELIQUIS 5 MG TABS tablet Take 5 mg by mouth 2 (two) times daily.    Marland Kitchen esomeprazole (NEXIUM) 20 MG capsule Take 20 mg by mouth daily at 12 noon.    . ferrous sulfate 325 (65 FE) MG tablet Take 325 mg by mouth daily with breakfast.    . LORazepam (ATIVAN) 1 MG tablet Take 1 mg by mouth at bedtime.     Marland Kitchen losartan (COZAAR) 100 MG tablet Take 100 mg by mouth daily.     . magnesium oxide (MAG-OX) 400 (241.3 Mg) MG tablet Take 1 tablet (400 mg total) by mouth daily. 30 tablet 6  . potassium chloride (KLOR-CON) 10 MEQ tablet Take 4 tablets (40 mEq total) by mouth daily. 120 tablet 6  . prednisoLONE acetate (PRED FORTE) 1 % ophthalmic suspension Place 1 drop into both eyes daily as needed (Burning).     . pyridOXINE (VITAMIN B-6) 100 MG tablet Take 100 mg by mouth daily.    . rosuvastatin (CRESTOR) 40 MG tablet Take 1 tablet (40 mg total) by mouth daily at 6 PM. 30 tablet 6  . ticagrelor (BRILINTA) 90 MG TABS tablet Take 1 tablet (90 mg total) by mouth 2 (two) times daily. 60 tablet 11  . vitamin E 400 UNIT capsule Take 400 Units by mouth daily.     No current facility-administered medications for this encounter.    No Known Allergies  Social History   Socioeconomic History  . Marital status: Widowed    Spouse name: Not on file  . Number of children: Not on file  . Years of education: Not on file  . Highest education level: Not on  file  Occupational History  . Not on file  Tobacco Use  . Smoking status: Never Smoker  . Smokeless tobacco: Never Used  Substance and Sexual Activity  . Alcohol use: Not Currently  . Drug use: No  . Sexual activity: Not on file  Other Topics Concern  . Not on file  Social History Narrative  . Not on file   Social Determinants of Health   Financial Resource Strain:   .  Difficulty of Paying Living Expenses:   Food Insecurity:   . Worried About Charity fundraiser in the Last Year:   . Arboriculturist in the Last Year:   Transportation Needs:   . Film/video editor (Medical):   Marland Kitchen Lack of Transportation (Non-Medical):   Physical Activity:   . Days of Exercise per Week:   . Minutes of Exercise per Session:   Stress:   . Feeling of Stress :   Social Connections:   . Frequency of Communication with Friends and Family:   . Frequency of Social Gatherings with Friends and Family:   . Attends Religious Services:   . Active Member of Clubs or Organizations:   . Attends Archivist Meetings:   Marland Kitchen Marital Status:   Intimate Partner Violence:   . Fear of Current or Ex-Partner:   . Emotionally Abused:   Marland Kitchen Physically Abused:   . Sexually Abused:     Family History  Problem Relation Age of Onset  . Heart disease Mother   . Cancer Brother        colon, pancreatic, brain    ROS- All systems are reviewed and negative except as per the HPI above  Physical Exam: Vitals:   06/21/19 1017  BP: (!) 148/58  Pulse: (!) 43  Weight: 82 kg  Height: 5' 4.5" (1.638 m)   Wt Readings from Last 3 Encounters:  06/21/19 82 kg  06/15/19 82.6 kg  06/14/19 80 kg    Labs: Lab Results  Component Value Date   NA 136 06/21/2019   K 4.6 06/21/2019   CL 102 06/21/2019   CO2 25 06/21/2019   GLUCOSE 94 06/21/2019   BUN 9 06/21/2019   CREATININE 0.54 06/21/2019   CALCIUM 9.6 06/21/2019   MG 2.2 06/21/2019   Lab Results  Component Value Date   INR 1.02 10/24/2015   No  results found for: CHOL, HDL, LDLCALC, TRIG   GEN- The patient is well appearing, alert and oriented x 3 today.   Head- normocephalic, atraumatic Eyes-  Sclera clear, conjunctiva pink Ears- hearing intact Oropharynx- clear Neck- supple, no JVP Lymph- no cervical lymphadenopathy Lungs- Clear to ausculation bilaterally, normal work of breathing Heart- irregular rate and rhythm, no murmurs, rubs or gallops, PMI not laterally displaced GI- soft, NT, ND, + BS Extremities- no clubbing, cyanosis, or edema MS- no significant deformity or atrophy Skin- no rash or lesion Psych- euthymic mood, full affect Neuro- strength and sensation are intact  EKG- today with  afib at 43 bpm, pr int 186 ms, qrs int 144 ms, qtc 483 ms (stable)     Assessment and Plan: 1. Persistent  afib S/p tikosyn load and is in Sinus brady at 43 bpm with RBBB Even being back in rhythm she still c/o of being short of breath with steps and fatigued She  did talk to Dr.Taylor in the hospital re timing of a PPM She has f/u with him May 11 No benadryl use  Continue 500 mcg of dofetilide bid  mag/K+ today    2. CHA2DS2VASc score of at least 5 Continue eliquis 5 mg bid  3. CAD No angina symptoms Stent 04/2019  Continue brilinta, now off asa  To start cardiac rehab soon   F/u with Dr. Lovena Le may 11    Haly Feher C. Angelin Cutrone, [facility] [address] [place], [place] [address] [phone]

## 2019-06-28 ENCOUNTER — Other Ambulatory Visit (HOSPITAL_COMMUNITY)
Admission: RE | Admit: 2019-06-28 | Discharge: 2019-06-28 | Disposition: A | Discharge status: Home / Self Care | Payer: Medicare Other | Source: Ambulatory Visit | Attending: Cardiology | Admitting: Cardiology

## 2019-06-28 ENCOUNTER — Telehealth (HOSPITAL_COMMUNITY): Payer: Self-pay | Admitting: Pharmacist

## 2019-06-28 DIAGNOSIS — Z01812 Encounter for preprocedural laboratory examination: Secondary | ICD-10-CM | POA: Insufficient documentation

## 2019-06-28 DIAGNOSIS — Z20822 Contact with and (suspected) exposure to covid-19: Secondary | ICD-10-CM | POA: Insufficient documentation

## 2019-06-28 LAB — SARS CORONAVIRUS 2 (TAT 6-24 HRS): SARS Coronavirus 2: NEGATIVE

## 2019-06-28 NOTE — Telephone Encounter (Signed)
Cardiac Rehab Medication Review by a Pharmacist  Does the patient  feel that his/her medications are working for him/her?  yes  Has the patient been experiencing any side effects to the medications prescribed?  no  Does the patient measure his/her own blood pressure or blood glucose at home?  yes - 140s/70, HR 49  Does the patient have any problems obtaining medications due to transportation or finances?   no  Understanding of regimen: good Understanding of indications: good Potential of compliance: good  [name], PharmD PGY1 Pharmacy Resident Phone: [phone] 06/28/2019  11:16 AM

## 2019-06-30 ENCOUNTER — Other Ambulatory Visit: Payer: Self-pay

## 2019-06-30 ENCOUNTER — Ambulatory Visit (HOSPITAL_BASED_OUTPATIENT_CLINIC_OR_DEPARTMENT_OTHER): Payer: Medicare Other | Attending: Interventional Cardiology | Admitting: Cardiology

## 2019-06-30 ENCOUNTER — Telehealth: Payer: Self-pay | Admitting: Nurse Practitioner

## 2019-06-30 VITALS — Ht 64.5 in | Wt 182.0 lb

## 2019-06-30 DIAGNOSIS — G4733 Obstructive sleep apnea (adult) (pediatric): Secondary | ICD-10-CM | POA: Insufficient documentation

## 2019-06-30 DIAGNOSIS — R0681 Apnea, not elsewhere classified: Secondary | ICD-10-CM

## 2019-06-30 NOTE — Telephone Encounter (Signed)
   Pt has been having ongoing issues w/ dyspnea in the setting of PAF s/p tikosyn loading and sinus bradycardia.  She is due to have a sleep study tonight and notes that she had a very difficult time sleeping last night.  She says she frequently dozes off and then awakes gasping for air.  We discussed that this may in fact be a symptom of sleep apnea and therefore an indication to follow through with the sleep study tonight.  She is just concerned that if she has another restless night of sleep, the study tonight will be very valuable.  I advised that if she is willing to try getting the study done tonight that we will have information regarding her sleep habits sooner rather than later.  She is scheduled to see Dr. Lovena Le in May to discuss possible pacemaker implantation in the setting of ongoing bradycardia, fatigue, and dyspnea.  I also advised that if she is having worsening dyspnea, presyncope, or syncope, that she should have a low threshold to be seen in the emergency department for further evaluation.  Caller verbalized understanding and was grateful for the call back.  Murray Hodgkins, NP 06/30/2019, 2:24 PM

## 2019-07-02 ENCOUNTER — Encounter (HOSPITAL_COMMUNITY)
Admission: RE | Admit: 2019-07-02 | Discharge: 2019-07-02 | Disposition: A | Discharge status: Home / Self Care | Payer: Medicare Other | Source: Ambulatory Visit | Attending: Interventional Cardiology | Admitting: Interventional Cardiology

## 2019-07-02 ENCOUNTER — Other Ambulatory Visit: Payer: Self-pay

## 2019-07-02 DIAGNOSIS — Z955 Presence of coronary angioplasty implant and graft: Secondary | ICD-10-CM | POA: Insufficient documentation

## 2019-07-02 NOTE — Progress Notes (Signed)
Cardiac Rehab Note:  Successful telephone encounter to [name] to confirm Cardiac Rehab orientation appointment for 07/03/19 at 1400. Nursing assessment completed. Patient questions answered. Instructions for appointment provided. Patient screening for Covid-19 negative.  Kele Withem E. Rollene Rotunda RN, BSN [place]. [facility]  Cardiac and Pulmonary Rehabilitation Phone: [phone] Fax: [phone]

## 2019-07-03 ENCOUNTER — Encounter (HOSPITAL_COMMUNITY): Payer: Self-pay

## 2019-07-03 ENCOUNTER — Encounter (HOSPITAL_COMMUNITY)
Admission: RE | Admit: 2019-07-03 | Discharge: 2019-07-03 | Disposition: A | Discharge status: Home / Self Care | Payer: Medicare Other | Source: Ambulatory Visit | Attending: Interventional Cardiology | Admitting: Interventional Cardiology

## 2019-07-03 VITALS — BP 152/58 | HR 49 | Temp 98.1°F | Resp 18 | Ht 64.0 in | Wt 174.8 lb

## 2019-07-03 DIAGNOSIS — Z955 Presence of coronary angioplasty implant and graft: Secondary | ICD-10-CM

## 2019-07-03 NOTE — Progress Notes (Signed)
Cardiac Individual Treatment Plan  Patient Details  Name: Sheila Gardner MRN: [id number] Date of Birth: [birth year] Referring Provider:     CARDIAC REHAB PHASE II ORIENTATION from 07/03/2019 in [facility]  Referring Provider  Belva Crome.      Initial Encounter Date:    CARDIAC REHAB PHASE II ORIENTATION from 07/03/2019 in [facility]  Date  07/03/19      Visit Diagnosis: Status post coronary artery stent placement  Patient's Home Medications on Admission:  Current Outpatient Medications:  .  acetaminophen (TYLENOL) 650 MG CR tablet, Take 1,300 mg by mouth every 8 (eight) hours as needed for pain., Disp: , Rfl:  .  Ascorbic Acid (VITAMIN C) 1000 MG tablet, Take 1,000 mg by mouth daily. , Disp: , Rfl:  .  BIOTIN PO, Take 10,000 Units by mouth daily with breakfast. , Disp: , Rfl:  .  Cholecalciferol (VITAMIN D-3) 1000 UNITS CAPS, Take 1,000 Units by mouth daily with breakfast. , Disp: , Rfl:  .  cyanocobalamin 1000 MCG tablet, Take 1,000 mcg by mouth daily. , Disp: , Rfl:  .  dofetilide (TIKOSYN) 500 MCG capsule, Take 1 capsule (500 mcg total) by mouth 2 (two) times daily., Disp: 60 capsule, Rfl: 6 .  ELIQUIS 5 MG TABS tablet, Take 5 mg by mouth 2 (two) times daily., Disp: , Rfl:  .  esomeprazole (NEXIUM) 20 MG capsule, Take 20 mg by mouth daily at 12 noon., Disp: , Rfl:  .  ferrous sulfate 325 (65 FE) MG tablet, Take 325 mg by mouth daily with breakfast., Disp: , Rfl:  .  LORazepam (ATIVAN) 1 MG tablet, Take 1 mg by mouth at bedtime. , Disp: , Rfl:  .  losartan (COZAAR) 100 MG tablet, Take 100 mg by mouth daily. , Disp: , Rfl:  .  magnesium oxide (MAG-OX) 400 (241.3 Mg) MG tablet, Take 1 tablet (400 mg total) by mouth daily., Disp: 30 tablet, Rfl: 6 .  potassium chloride (KLOR-CON) 10 MEQ tablet, Take 4 tablets (40 mEq total) by mouth daily., Disp: 120 tablet, Rfl: 6 .  prednisoLONE acetate (PRED FORTE) 1 % ophthalmic  suspension, Place 1 drop into both eyes daily as needed (Burning). , Disp: , Rfl:  .  pyridOXINE (VITAMIN B-6) 100 MG tablet, Take 100 mg by mouth daily., Disp: , Rfl:  .  rosuvastatin (CRESTOR) 40 MG tablet, Take 1 tablet (40 mg total) by mouth daily at 6 PM., Disp: 30 tablet, Rfl: 6 .  ticagrelor (BRILINTA) 90 MG TABS tablet, Take 1 tablet (90 mg total) by mouth 2 (two) times daily., Disp: 60 tablet, Rfl: 11 .  vitamin E 400 UNIT capsule, Take 400 Units by mouth daily., Disp: , Rfl:   Past Medical History: Past Medical History:  Diagnosis Date  . Arthritis   . Atrial fibrillation ([place])   . Breast cancer ([place])   . Cancer ([place])    Breast- rt  . Cough with sputum 2016  . Elevated liver function tests 05/09/2013  . GERD (gastroesophageal reflux disease)    COSTOCHONDRITIS  . Heart murmur   . Hip pain 05/08/2014  . Hyperlipidemia   . Hypertension   . Lipoma    back of neck  . PONV (postoperative nausea and vomiting)    only after rt hip surgery  . Primary osteoarthritis of left knee 11/04/2015  . Primary osteoarthritis of right hip 07/30/2014    Tobacco Use: Social History  Tobacco Use  Smoking Status Never Smoker  Smokeless Tobacco Never Used    Labs: Recent Review Flowsheet Data    There is no flowsheet data to display.      Capillary Blood Glucose: No results found for: GLUCAP   Exercise Target Goals: Exercise Program Goal: Individual exercise prescription set using results from initial 6 min walk test and THRR while considering  patient's activity barriers and safety.   Exercise Prescription Goal: Initial exercise prescription builds to 30-45 minutes a day of aerobic activity, 2-3 days per week.  Home exercise guidelines will be given to patient during program as part of exercise prescription that the participant will acknowledge.  Activity Barriers & Risk Stratification: Activity Barriers & Cardiac Risk Stratification - 07/03/19 1515      Activity Barriers &  Cardiac Risk Stratification   Activity Barriers  Joint Problems;Left Knee Replacement;Shortness of Breath;Arthritis    Cardiac Risk Stratification  High       6 Minute Walk: 6 Minute Walk    Row Name 07/03/19 1435         6 Minute Walk   Distance  1407 feet     Walk Time  6 minutes     # of Rest Breaks  0     MPH  2.66     METS  2.2     RPE  11     Perceived Dyspnea   0     VO2 Peak  7.73     Symptoms  Yes (comment)     Comments  Left knee pain # 5 on 0-10  scale     Resting HR  49 bpm     Resting BP  152/58     Resting Oxygen Saturation   97 %     Exercise Oxygen Saturation  during 6 min walk  98 %     Max Ex. HR  79 bpm     Max Ex. BP  160/78     2 Minute Post BP  132/64        Oxygen Initial Assessment:   Oxygen Re-Evaluation:   Oxygen Discharge (Final Oxygen Re-Evaluation):   Initial Exercise Prescription: Initial Exercise Prescription - 07/03/19 1500      Date of Initial Exercise RX and Referring Provider   Date  07/03/19    Referring Provider  Belva Crome.    Expected Discharge Date  08/31/19      Recumbant Bike   Level  1    RPM  60    Watts  45    Minutes  15    METs  2      NuStep   Level  2    SPM  60    Minutes  15    METs  2      Prescription Details   Frequency (times per week)  3    Duration  Progress to 30 minutes of continuous aerobic without signs/symptoms of physical distress      Intensity   THRR 40-80% of Max Heartrate  55-110    Ratings of Perceived Exertion  11-13    Perceived Dyspnea  0-4      Progression   Progression  Continue progressive overload as per policy without signs/symptoms or physical distress.      Resistance Training   Training Prescription  Yes    Weight  2    Reps  10-15       Perform Capillary Blood Glucose  checks as needed.  Exercise Prescription Changes:   Exercise Comments:   Exercise Goals and Review:  Exercise Goals    Row Name 07/03/19 1405             Exercise Goals    Increase Physical Activity  Yes       Intervention  Provide advice, education, support and counseling about physical activity/exercise needs.;Develop an individualized exercise prescription for aerobic and resistive training based on initial evaluation findings, risk stratification, comorbidities and participant's personal goals.       Expected Outcomes  Short Term: Attend rehab on a regular basis to increase amount of physical activity.;Long Term: Add in home exercise to make exercise part of routine and to increase amount of physical activity.;Long Term: Exercising regularly at least 3-5 days a week.       Increase Strength and Stamina  Yes       Intervention  Provide advice, education, support and counseling about physical activity/exercise needs.;Develop an individualized exercise prescription for aerobic and resistive training based on initial evaluation findings, risk stratification, comorbidities and participant's personal goals.       Expected Outcomes  Short Term: Increase workloads from initial exercise prescription for resistance, speed, and METs.;Short Term: Perform resistance training exercises routinely during rehab and add in resistance training at home;Long Term: Improve cardiorespiratory fitness, muscular endurance and strength as measured by increased METs and functional capacity (6MWT)       Able to understand and use rate of perceived exertion (RPE) scale  Yes       Intervention  Provide education and explanation on how to use RPE scale       Expected Outcomes  Short Term: Able to use RPE daily in rehab to express subjective intensity level;Long Term:  Able to use RPE to guide intensity level when exercising independently       Knowledge and understanding of Target Heart Rate Range (THRR)  Yes       Intervention  Provide education and explanation of THRR including how the numbers were predicted and where they are located for reference       Expected Outcomes  Short Term: Able to  state/look up THRR;Long Term: Able to use THRR to govern intensity when exercising independently;Short Term: Able to use daily as guideline for intensity in rehab       Able to check pulse independently  Yes       Intervention  Provide education and demonstration on how to check pulse in carotid and radial arteries.;Review the importance of being able to check your own pulse for safety during independent exercise       Expected Outcomes  Short Term: Able to explain why pulse checking is important during independent exercise       Understanding of Exercise Prescription  Yes       Intervention  Provide education, explanation, and written materials on patient's individual exercise prescription       Expected Outcomes  Short Term: Able to explain program exercise prescription;Long Term: Able to explain home exercise prescription to exercise independently          Exercise Goals Re-Evaluation :   Discharge Exercise Prescription (Final Exercise Prescription Changes):   Nutrition:  Target Goals: Understanding of nutrition guidelines, daily intake of sodium 1500mg , cholesterol 200mg , calories 30% from fat and 7% or less from saturated fats, daily to have 5 or more servings of fruits and vegetables.  Biometrics: Pre Biometrics - 07/03/19 1419  Pre Biometrics   Waist Circumference  41.75 inches    Hip Circumference  45 inches    Waist to Hip Ratio  0.93 %    Triceps Skinfold  35 mm    % Body Fat  44.9 %    Grip Strength  27 kg    Flexibility  10.25 in    Single Leg Stand  5.31 seconds        Nutrition Therapy Plan and Nutrition Goals:   Nutrition Assessments:   Nutrition Goals Re-Evaluation:   Nutrition Goals Re-Evaluation:   Nutrition Goals Discharge (Final Nutrition Goals Re-Evaluation):   Psychosocial: Target Goals: Acknowledge presence or absence of significant depression and/or stress, maximize coping skills, provide positive support system. Participant is able to  verbalize types and ability to use techniques and skills needed for reducing stress and depression.  Initial Review & Psychosocial Screening: Initial Psych Review & Screening - 07/03/19 1617      Initial Review   Current issues with  None Identified      Family Dynamics   Good Support System?  Yes    Comments  Sheila Gardner has a positive attitude and outlook. She describes a positive support system of family and friends. No interventions needed at this time.      Barriers   Psychosocial barriers to participate in program  There are no identifiable barriers or psychosocial needs.      Screening Interventions   Interventions  Encouraged to exercise       Quality of Life Scores:  Scores of 19 and below usually indicate a poorer quality of life in these areas.  A difference of  2-3 points is a clinically meaningful difference.  A difference of 2-3 points in the total score of the Quality of Life Index has been associated with significant improvement in overall quality of life, self-image, physical symptoms, and general health in studies assessing change in quality of life.  PHQ-9: Recent Review Flowsheet Data    Depression screen [facility] 2/9 07/03/2019   Decreased Interest 0   Down, Depressed, Hopeless 0   PHQ - 2 Score 0     Interpretation of Total Score  Total Score Depression Severity:  1-4 = Minimal depression, 5-9 = Mild depression, 10-14 = Moderate depression, 15-19 = Moderately severe depression, 20-27 = Severe depression   Psychosocial Evaluation and Intervention:   Psychosocial Re-Evaluation:   Psychosocial Discharge (Final Psychosocial Re-Evaluation):   Vocational Rehabilitation: Provide vocational rehab assistance to qualifying candidates.   Vocational Rehab Evaluation & Intervention:   Education: Education Goals: Education classes will be provided on a weekly basis, covering required topics. Participant will state understanding/return demonstration of topics  presented.  Learning Barriers/Preferences: Learning Barriers/Preferences - 07/03/19 1534      Learning Barriers/Preferences   Learning Barriers  None    Learning Preferences  Written Material       Education Topics: Count Your Pulse:  -Group instruction provided by verbal instruction, demonstration, patient participation and written materials to support subject.  Instructors address importance of being able to find your pulse and how to count your pulse when at home without a heart monitor.  Patients get hands on experience counting their pulse with staff help and individually.   Heart Attack, Angina, and Risk Factor Modification:  -Group instruction provided by verbal instruction, video, and written materials to support subject.  Instructors address signs and symptoms of angina and heart attacks.    Also discuss risk factors for heart disease and  how to make changes to improve heart health risk factors.   Functional Fitness:  -Group instruction provided by verbal instruction, demonstration, patient participation, and written materials to support subject.  Instructors address safety measures for doing things around the house.  Discuss how to get up and down off the floor, how to pick things up properly, how to safely get out of a chair without assistance, and balance training.   Meditation and Mindfulness:  -Group instruction provided by verbal instruction, patient participation, and written materials to support subject.  Instructor addresses importance of mindfulness and meditation practice to help reduce stress and improve awareness.  Instructor also leads participants through a meditation exercise.    Stretching for Flexibility and Mobility:  -Group instruction provided by verbal instruction, patient participation, and written materials to support subject.  Instructors lead participants through series of stretches that are designed to increase flexibility thus improving mobility.   These stretches are additional exercise for major muscle groups that are typically performed during regular warm up and cool down.   Hands Only CPR:  -Group verbal, video, and participation provides a basic overview of AHA guidelines for community CPR. Role-play of emergencies allow participants the opportunity to practice calling for help and chest compression technique with discussion of AED use.   Hypertension: -Group verbal and written instruction that provides a basic overview of hypertension including the most recent diagnostic guidelines, risk factor reduction with self-care instructions and medication management.    Nutrition I class: Heart Healthy Eating:  -Group instruction provided by PowerPoint slides, verbal discussion, and written materials to support subject matter. The instructor gives an explanation and review of the Therapeutic Lifestyle Changes diet recommendations, which includes a discussion on lipid goals, dietary fat, sodium, fiber, plant stanol/sterol esters, sugar, and the components of a well-balanced, healthy diet.   Nutrition II class: Lifestyle Skills:  -Group instruction provided by PowerPoint slides, verbal discussion, and written materials to support subject matter. The instructor gives an explanation and review of label reading, grocery shopping for heart health, heart healthy recipe modifications, and ways to make healthier choices when eating out.   Diabetes Question & Answer:  -Group instruction provided by PowerPoint slides, verbal discussion, and written materials to support subject matter. The instructor gives an explanation and review of diabetes co-morbidities, pre- and post-prandial blood glucose goals, pre-exercise blood glucose goals, signs, symptoms, and treatment of hypoglycemia and hyperglycemia, and foot care basics.   Diabetes Blitz:  -Group instruction provided by PowerPoint slides, verbal discussion, and written materials to support subject  matter. The instructor gives an explanation and review of the physiology behind type 1 and type 2 diabetes, diabetes medications and rational behind using different medications, pre- and post-prandial blood glucose recommendations and Hemoglobin A1c goals, diabetes diet, and exercise including blood glucose guidelines for exercising safely.    Portion Distortion:  -Group instruction provided by PowerPoint slides, verbal discussion, written materials, and food models to support subject matter. The instructor gives an explanation of serving size versus portion size, changes in portions sizes over the last 20 years, and what consists of a serving from each food group.   Stress Management:  -Group instruction provided by verbal instruction, video, and written materials to support subject matter.  Instructors review role of stress in heart disease and how to cope with stress positively.     Exercising on Your Own:  -Group instruction provided by verbal instruction, power point, and written materials to support subject.  Instructors discuss benefits of  exercise, components of exercise, frequency and intensity of exercise, and end points for exercise.  Also discuss use of nitroglycerin and activating EMS.  Review options of places to exercise outside of rehab.  Review guidelines for sex with heart disease.   Cardiac Drugs I:  -Group instruction provided by verbal instruction and written materials to support subject.  Instructor reviews cardiac drug classes: antiplatelets, anticoagulants, beta blockers, and statins.  Instructor discusses reasons, side effects, and lifestyle considerations for each drug class.   Cardiac Drugs II:  -Group instruction provided by verbal instruction and written materials to support subject.  Instructor reviews cardiac drug classes: angiotensin converting enzyme inhibitors (ACE-I), angiotensin II receptor blockers (ARBs), nitrates, and calcium channel blockers.  Instructor  discusses reasons, side effects, and lifestyle considerations for each drug class.   Anatomy and Physiology of the Circulatory System:  Group verbal and written instruction and models provide basic cardiac anatomy and physiology, with the coronary electrical and arterial systems. Review of: AMI, Angina, Valve disease, Heart Failure, Peripheral Artery Disease, Cardiac Arrhythmia, Pacemakers, and the ICD.   Other Education:  -Group or individual verbal, written, or video instructions that support the educational goals of the cardiac rehab program.   Holiday Eating Survival Tips:  -Group instruction provided by PowerPoint slides, verbal discussion, and written materials to support subject matter. The instructor gives patients tips, tricks, and techniques to help them not only survive but enjoy the holidays despite the onslaught of food that accompanies the holidays.   Knowledge Questionnaire Score:   Core Components/Risk Factors/Patient Goals at Admission: Personal Goals and Risk Factors at Admission - 07/03/19 1536      Core Components/Risk Factors/Patient Goals on Admission    Weight Management  Yes    Intervention  Weight Management: Develop a combined nutrition and exercise program designed to reach desired caloric intake, while maintaining appropriate intake of nutrient and fiber, sodium and fats, and appropriate energy expenditure required for the weight goal.;Weight Management: Provide education and appropriate resources to help participant work on and attain dietary goals.;Weight Management/Obesity: Establish reasonable short term and long term weight goals.;Obesity: Provide education and appropriate resources to help participant work on and attain dietary goals.    Admit Weight  174 lb 12.8 oz (79.3 kg)    Expected Outcomes  Short Term: Continue to assess and modify interventions until short term weight is achieved;Long Term: Adherence to nutrition and physical activity/exercise  program aimed toward attainment of established weight goal;Weight Loss: Understanding of general recommendations for a balanced deficit meal plan, which promotes 1-2 lb weight loss per week and includes a negative energy balance of [phone] kcal/d;Understanding recommendations for meals to include 15-35% energy as protein, 25-35% energy from fat, 35-60% energy from carbohydrates, less than 200mg  of dietary cholesterol, 20-35 gm of total fiber daily;Understanding of distribution of calorie intake throughout the day with the consumption of 4-5 meals/snacks    Hypertension  Yes    Intervention  Provide education on lifestyle modifcations including regular physical activity/exercise, weight management, moderate sodium restriction and increased consumption of fresh fruit, vegetables, and low fat dairy, alcohol moderation, and smoking cessation.;Monitor prescription use compliance.    Expected Outcomes  Short Term: Continued assessment and intervention until BP is < 140/2mm HG in hypertensive participants. < 130/73mm HG in hypertensive participants with diabetes, heart failure or chronic kidney disease.;Long Term: Maintenance of blood pressure at goal levels.    Lipids  Yes    Intervention  Provide education and support for participant on  nutrition & aerobic/resistive exercise along with prescribed medications to achieve LDL 70mg , HDL >40mg .    Expected Outcomes  Short Term: Participant states understanding of desired cholesterol values and is compliant with medications prescribed. Participant is following exercise prescription and nutrition guidelines.;Long Term: Cholesterol controlled with medications as prescribed, with individualized exercise RX and with personalized nutrition plan. Value goals: LDL < 70mg , HDL > 40 mg.       Core Components/Risk Factors/Patient Goals Review:    Core Components/Risk Factors/Patient Goals at Discharge (Final Review):    ITP Comments: ITP Comments    Row Name  07/03/19 1417           ITP Comments  Dr. Fransico Him, Medical Director Cardiac Re[facility]          Comments: Patient attended orientation on 07/03/2019 to review rules and guidelines for program.  Completed 6 minute walk test, Intitial ITP, and exercise prescription.  VSS. Telemetry-SB, BBB.  Asymptomatic. Safety measures and social distancing in place per CDC guidelines.

## 2019-07-05 ENCOUNTER — Telehealth: Payer: Self-pay | Admitting: Cardiology

## 2019-07-05 NOTE — Telephone Encounter (Signed)
Informed patient I would call her when her results are in. Pt is aware and agreeable to treatment.

## 2019-07-05 NOTE — Telephone Encounter (Signed)
New Message   Pt is calling for results of her sleep study     Please call back

## 2019-07-09 ENCOUNTER — Encounter (HOSPITAL_COMMUNITY)
Admission: RE | Admit: 2019-07-09 | Discharge: 2019-07-09 | Disposition: A | Discharge status: Home / Self Care | Payer: Medicare Other | Source: Ambulatory Visit | Attending: Interventional Cardiology | Admitting: Interventional Cardiology

## 2019-07-09 ENCOUNTER — Other Ambulatory Visit: Payer: Self-pay

## 2019-07-09 DIAGNOSIS — I10 Essential (primary) hypertension: Secondary | ICD-10-CM | POA: Insufficient documentation

## 2019-07-09 DIAGNOSIS — K219 Gastro-esophageal reflux disease without esophagitis: Secondary | ICD-10-CM | POA: Insufficient documentation

## 2019-07-09 DIAGNOSIS — E785 Hyperlipidemia, unspecified: Secondary | ICD-10-CM | POA: Diagnosis not present

## 2019-07-09 DIAGNOSIS — Z79899 Other long term (current) drug therapy: Secondary | ICD-10-CM | POA: Insufficient documentation

## 2019-07-09 DIAGNOSIS — Z7901 Long term (current) use of anticoagulants: Secondary | ICD-10-CM | POA: Diagnosis not present

## 2019-07-09 DIAGNOSIS — Z955 Presence of coronary angioplasty implant and graft: Secondary | ICD-10-CM | POA: Diagnosis not present

## 2019-07-09 NOTE — Progress Notes (Signed)
Cardiac Individual Treatment Plan  Patient Details  Name: Sheila Gardner MRN: [id number] Date of Birth: 11-Jul-1935 Referring Provider:     CARDIAC REHAB PHASE II ORIENTATION from 07/03/2019 in [facility]  Referring Provider  Belva Crome.      Initial Encounter Date:    CARDIAC REHAB PHASE II ORIENTATION from 07/03/2019 in [facility]  Date  07/03/19      Visit Diagnosis: Status post coronary artery stent placement  Patient's Home Medications on Admission:  Current Outpatient Medications:  .  acetaminophen (TYLENOL) 650 MG CR tablet, Take 1,300 mg by mouth every 8 (eight) hours as needed for pain., Disp: , Rfl:  .  Ascorbic Acid (VITAMIN C) 1000 MG tablet, Take 1,000 mg by mouth daily. , Disp: , Rfl:  .  BIOTIN PO, Take 10,000 Units by mouth daily with breakfast. , Disp: , Rfl:  .  Cholecalciferol (VITAMIN D-3) 1000 UNITS CAPS, Take 1,000 Units by mouth daily with breakfast. , Disp: , Rfl:  .  cyanocobalamin 1000 MCG tablet, Take 1,000 mcg by mouth daily. , Disp: , Rfl:  .  dofetilide (TIKOSYN) 500 MCG capsule, Take 1 capsule (500 mcg total) by mouth 2 (two) times daily., Disp: 60 capsule, Rfl: 6 .  ELIQUIS 5 MG TABS tablet, Take 5 mg by mouth 2 (two) times daily., Disp: , Rfl:  .  esomeprazole (NEXIUM) 20 MG capsule, Take 20 mg by mouth daily at 12 noon., Disp: , Rfl:  .  ferrous sulfate 325 (65 FE) MG tablet, Take 325 mg by mouth daily with breakfast., Disp: , Rfl:  .  LORazepam (ATIVAN) 1 MG tablet, Take 1 mg by mouth at bedtime. , Disp: , Rfl:  .  losartan (COZAAR) 100 MG tablet, Take 100 mg by mouth daily. , Disp: , Rfl:  .  magnesium oxide (MAG-OX) 400 (241.3 Mg) MG tablet, Take 1 tablet (400 mg total) by mouth daily., Disp: 30 tablet, Rfl: 6 .  potassium chloride (KLOR-CON) 10 MEQ tablet, Take 4 tablets (40 mEq total) by mouth daily., Disp: 120 tablet, Rfl: 6 .  prednisoLONE acetate (PRED FORTE) 1 % ophthalmic  suspension, Place 1 drop into both eyes daily as needed (Burning). , Disp: , Rfl:  .  pyridOXINE (VITAMIN B-6) 100 MG tablet, Take 100 mg by mouth daily., Disp: , Rfl:  .  rosuvastatin (CRESTOR) 40 MG tablet, Take 1 tablet (40 mg total) by mouth daily at 6 PM., Disp: 30 tablet, Rfl: 6 .  ticagrelor (BRILINTA) 90 MG TABS tablet, Take 1 tablet (90 mg total) by mouth 2 (two) times daily., Disp: 60 tablet, Rfl: 11 .  vitamin E 400 UNIT capsule, Take 400 Units by mouth daily., Disp: , Rfl:   Past Medical History: Past Medical History:  Diagnosis Date  . Arthritis   . Atrial fibrillation ([place])   . Breast cancer ([place])   . Cancer ([place])    Breast- rt  . Cough with sputum 2016  . Elevated liver function tests 05/09/2013  . GERD (gastroesophageal reflux disease)    COSTOCHONDRITIS  . Heart murmur   . Hip pain 05/08/2014  . Hyperlipidemia   . Hypertension   . Lipoma    back of neck  . PONV (postoperative nausea and vomiting)    only after rt hip surgery  . Primary osteoarthritis of left knee 11/04/2015  . Primary osteoarthritis of right hip 07/30/2014    Tobacco Use: Social History  Tobacco Use  Smoking Status Never Smoker  Smokeless Tobacco Never Used    Labs: Recent Review Flowsheet Data    There is no flowsheet data to display.      Capillary Blood Glucose: No results found for: GLUCAP   Exercise Target Goals: Exercise Program Goal: Individual exercise prescription set using results from initial 6 min walk test and THRR while considering  patient's activity barriers and safety.   Exercise Prescription Goal: Initial exercise prescription builds to 30-45 minutes a day of aerobic activity, 2-3 days per week.  Home exercise guidelines will be given to patient during program as part of exercise prescription that the participant will acknowledge.  Activity Barriers & Risk Stratification: Activity Barriers & Cardiac Risk Stratification - 07/03/19 1515      Activity Barriers &  Cardiac Risk Stratification   Activity Barriers  Joint Problems;Left Knee Replacement;Shortness of Breath;Arthritis    Cardiac Risk Stratification  High       6 Minute Walk: 6 Minute Walk    Row Name 07/03/19 1435         6 Minute Walk   Distance  1407 feet     Walk Time  6 minutes     # of Rest Breaks  0     MPH  2.66     METS  2.2     RPE  11     Perceived Dyspnea   0     VO2 Peak  7.73     Symptoms  Yes (comment)     Comments  Left knee pain # 5 on 0-10  scale     Resting HR  49 bpm     Resting BP  152/58     Resting Oxygen Saturation   97 %     Exercise Oxygen Saturation  during 6 min walk  98 %     Max Ex. HR  79 bpm     Max Ex. BP  160/78     2 Minute Post BP  132/64        Oxygen Initial Assessment:   Oxygen Re-Evaluation:   Oxygen Discharge (Final Oxygen Re-Evaluation):   Initial Exercise Prescription: Initial Exercise Prescription - 07/03/19 1500      Date of Initial Exercise RX and Referring Provider   Date  07/03/19    Referring Provider  Belva Crome.    Expected Discharge Date  08/31/19      Recumbant Bike   Level  1    RPM  60    Watts  45    Minutes  15    METs  2      NuStep   Level  2    SPM  60    Minutes  15    METs  2      Prescription Details   Frequency (times per week)  3    Duration  Progress to 30 minutes of continuous aerobic without signs/symptoms of physical distress      Intensity   THRR 40-80% of Max Heartrate  55-110    Ratings of Perceived Exertion  11-13    Perceived Dyspnea  0-4      Progression   Progression  Continue progressive overload as per policy without signs/symptoms or physical distress.      Resistance Training   Training Prescription  Yes    Weight  2    Reps  10-15       Perform Capillary Blood Glucose  checks as needed.  Exercise Prescription Changes:  Exercise Prescription Changes    Row Name 07/09/19 1354             Response to Exercise   Blood Pressure (Admit)  124/78        Blood Pressure (Exercise)  168/82       Blood Pressure (Exit)  130/68       Heart Rate (Admit)  56 bpm       Heart Rate (Exercise)  81 bpm       Heart Rate (Exit)  57 bpm       Rating of Perceived Exertion (Exercise)  12       Symptoms  Patient c/o left knee pain.       Comments  Off to a good start with exercise with some left knee pain.       Duration  Continue with 30 min of aerobic exercise without signs/symptoms of physical distress.       Intensity  THRR unchanged         Progression   Progression  Continue to progress workloads to maintain intensity without signs/symptoms of physical distress.       Average METs  2.7         Resistance Training   Training Prescription  Yes       Weight  2       Reps  10-15       Time  10 Minutes         Interval Training   Interval Training  No         Recumbant Bike   Level  1       Watts  18       Minutes  15       METs  2.7         NuStep   Level  2       SPM  92       Minutes  15       METs  2.6          Exercise Comments:  Exercise Comments    Row Name 07/09/19 1446 07/11/19 1400         Exercise Comments  Patient tolerated first session of exercise well with some left knee pain, which is chronic for her.  Reviewed METs and goals with patient.         Exercise Goals and Review:  Exercise Goals    Row Name 07/03/19 1405             Exercise Goals   Increase Physical Activity  Yes       Intervention  Provide advice, education, support and counseling about physical activity/exercise needs.;Develop an individualized exercise prescription for aerobic and resistive training based on initial evaluation findings, risk stratification, comorbidities and participant's personal goals.       Expected Outcomes  Short Term: Attend rehab on a regular basis to increase amount of physical activity.;Long Term: Add in home exercise to make exercise part of routine and to increase amount of physical activity.;Long Term:  Exercising regularly at least 3-5 days a week.       Increase Strength and Stamina  Yes       Intervention  Provide advice, education, support and counseling about physical activity/exercise needs.;Develop an individualized exercise prescription for aerobic and resistive training based on initial evaluation findings, risk stratification, comorbidities and participant's personal goals.  Expected Outcomes  Short Term: Increase workloads from initial exercise prescription for resistance, speed, and METs.;Short Term: Perform resistance training exercises routinely during rehab and add in resistance training at home;Long Term: Improve cardiorespiratory fitness, muscular endurance and strength as measured by increased METs and functional capacity (6MWT)       Able to understand and use rate of perceived exertion (RPE) scale  Yes       Intervention  Provide education and explanation on how to use RPE scale       Expected Outcomes  Short Term: Able to use RPE daily in rehab to express subjective intensity level;Long Term:  Able to use RPE to guide intensity level when exercising independently       Knowledge and understanding of Target Heart Rate Range (THRR)  Yes       Intervention  Provide education and explanation of THRR including how the numbers were predicted and where they are located for reference       Expected Outcomes  Short Term: Able to state/look up THRR;Long Term: Able to use THRR to govern intensity when exercising independently;Short Term: Able to use daily as guideline for intensity in rehab       Able to check pulse independently  Yes       Intervention  Provide education and demonstration on how to check pulse in carotid and radial arteries.;Review the importance of being able to check your own pulse for safety during independent exercise       Expected Outcomes  Short Term: Able to explain why pulse checking is important during independent exercise       Understanding of Exercise  Prescription  Yes       Intervention  Provide education, explanation, and written materials on patient's individual exercise prescription       Expected Outcomes  Short Term: Able to explain program exercise prescription;Long Term: Able to explain home exercise prescription to exercise independently          Exercise Goals Re-Evaluation : Exercise Goals Re-Evaluation    Row Name 07/09/19 1446 07/11/19 1400           Exercise Goal Re-Evaluation   Exercise Goals Review  Increase Physical Activity;Able to understand and use rate of perceived exertion (RPE) scale  Increase Physical Activity;Able to understand and use rate of perceived exertion (RPE) scale;Increase Strength and Stamina      Comments  Patient able to understand and use RPE scale appropriately.  Patient is walking 30 minutes daily in addition to exercise at cardiac rehab. Patient's goals are to make her heart and body stronger and to lose weight.      Expected Outcomes  Increase workloads as tolerated to help improve cardiorespiratory fitness.  Patient will continue daily exercise routine to help achieve personal health and fitness goals.         Discharge Exercise Prescription (Final Exercise Prescription Changes): Exercise Prescription Changes - 07/09/19 1354      Response to Exercise   Blood Pressure (Admit)  124/78    Blood Pressure (Exercise)  168/82    Blood Pressure (Exit)  130/68    Heart Rate (Admit)  56 bpm    Heart Rate (Exercise)  81 bpm    Heart Rate (Exit)  57 bpm    Rating of Perceived Exertion (Exercise)  12    Symptoms  Patient c/o left knee pain.    Comments  Off to a good start with exercise with some left knee pain.  Duration  Continue with 30 min of aerobic exercise without signs/symptoms of physical distress.    Intensity  THRR unchanged      Progression   Progression  Continue to progress workloads to maintain intensity without signs/symptoms of physical distress.    Average METs  2.7       Resistance Training   Training Prescription  Yes    Weight  2    Reps  10-15    Time  10 Minutes      Interval Training   Interval Training  No      Recumbant Bike   Level  1    Watts  18    Minutes  15    METs  2.7      NuStep   Level  2    SPM  92    Minutes  15    METs  2.6       Nutrition:  Target Goals: Understanding of nutrition guidelines, daily intake of sodium 1500mg , cholesterol 200mg , calories 30% from fat and 7% or less from saturated fats, daily to have 5 or more servings of fruits and vegetables.  Biometrics: Pre Biometrics - 07/03/19 1419      Pre Biometrics   Waist Circumference  41.75 inches    Hip Circumference  45 inches    Waist to Hip Ratio  0.93 %    Triceps Skinfold  35 mm    % Body Fat  44.9 %    Grip Strength  27 kg    Flexibility  10.25 in    Single Leg Stand  5.31 seconds        Nutrition Therapy Plan and Nutrition Goals:   Nutrition Assessments:   Nutrition Goals Re-Evaluation:   Nutrition Goals Re-Evaluation:   Nutrition Goals Discharge (Final Nutrition Goals Re-Evaluation):   Psychosocial: Target Goals: Acknowledge presence or absence of significant depression and/or stress, maximize coping skills, provide positive support system. Participant is able to verbalize types and ability to use techniques and skills needed for reducing stress and depression.  Initial Review & Psychosocial Screening: Initial Psych Review & Screening - 07/03/19 1617      Initial Review   Current issues with  None Identified      Family Dynamics   Good Support System?  Yes    Comments  Ms. Westenhaver has a positive attitude and outlook. She describes a positive support system of family and friends. No interventions needed at this time.      Barriers   Psychosocial barriers to participate in program  There are no identifiable barriers or psychosocial needs.      Screening Interventions   Interventions  Encouraged to exercise       Quality  of Life Scores: Quality of Life - 07/11/19 1550      Quality of Life   Select  Quality of Life      Quality of Life Scores   Health/Function Pre  21.69 %    Socioeconomic Pre  30 %    Psych/Spiritual Pre  30 %    Family Pre  25.5 %    GLOBAL Pre  25.66 %      Scores of 19 and below usually indicate a poorer quality of life in these areas.  A difference of  2-3 points is a clinically meaningful difference.  A difference of 2-3 points in the total score of the Quality of Life Index has been associated with significant improvement in overall quality  of life, self-image, physical symptoms, and general health in studies assessing change in quality of life.  PHQ-9: Recent Review Flowsheet Data    Depression screen [facility] 2/9 07/03/2019   Decreased Interest 0   Down, Depressed, Hopeless 0   PHQ - 2 Score 0     Interpretation of Total Score  Total Score Depression Severity:  1-4 = Minimal depression, 5-9 = Mild depression, 10-14 = Moderate depression, 15-19 = Moderately severe depression, 20-27 = Severe depression   Psychosocial Evaluation and Intervention: Psychosocial Evaluation - 07/09/19 1555      Psychosocial Evaluation & Interventions   Interventions  Encouraged to exercise with the program and follow exercise prescription    Comments  Ms. Poorman continues to have a positive outlook and attitude. She acknolodges a strong support system of family and friends. She enjoys cooking, entertaining, and spending time with her grandchildren and utilizes this joy to positively deal with any stress that may arise. No interventions needed at this time.    Expected Outcomes  Patient will continue to have a positive outlook and attitude. Will continue to remain free from psychosocial barriers to exercie and participation in CR.    Continue Psychosocial Services   No Follow up required       Psychosocial Re-Evaluation:   Psychosocial Discharge (Final Psychosocial Re-Evaluation):   Vocational  Rehabilitation: Provide vocational rehab assistance to qualifying candidates.   Vocational Rehab Evaluation & Intervention:   Education: Education Goals: Education classes will be provided on a weekly basis, covering required topics. Participant will state understanding/return demonstration of topics presented.  Learning Barriers/Preferences: Learning Barriers/Preferences - 07/03/19 1534      Learning Barriers/Preferences   Learning Barriers  None    Learning Preferences  Written Material       Education Topics: Count Your Pulse:  -Group instruction provided by verbal instruction, demonstration, patient participation and written materials to support subject.  Instructors address importance of being able to find your pulse and how to count your pulse when at home without a heart monitor.  Patients get hands on experience counting their pulse with staff help and individually.   Heart Attack, Angina, and Risk Factor Modification:  -Group instruction provided by verbal instruction, video, and written materials to support subject.  Instructors address signs and symptoms of angina and heart attacks.    Also discuss risk factors for heart disease and how to make changes to improve heart health risk factors.   Functional Fitness:  -Group instruction provided by verbal instruction, demonstration, patient participation, and written materials to support subject.  Instructors address safety measures for doing things around the house.  Discuss how to get up and down off the floor, how to pick things up properly, how to safely get out of a chair without assistance, and balance training.   Meditation and Mindfulness:  -Group instruction provided by verbal instruction, patient participation, and written materials to support subject.  Instructor addresses importance of mindfulness and meditation practice to help reduce stress and improve awareness.  Instructor also leads participants through a  meditation exercise.    Stretching for Flexibility and Mobility:  -Group instruction provided by verbal instruction, patient participation, and written materials to support subject.  Instructors lead participants through series of stretches that are designed to increase flexibility thus improving mobility.  These stretches are additional exercise for major muscle groups that are typically performed during regular warm up and cool down.   Hands Only CPR:  -Group verbal, video, and participation  provides a basic overview of AHA guidelines for community CPR. Role-play of emergencies allow participants the opportunity to practice calling for help and chest compression technique with discussion of AED use.   Hypertension: -Group verbal and written instruction that provides a basic overview of hypertension including the most recent diagnostic guidelines, risk factor reduction with self-care instructions and medication management.    Nutrition I class: Heart Healthy Eating:  -Group instruction provided by PowerPoint slides, verbal discussion, and written materials to support subject matter. The instructor gives an explanation and review of the Therapeutic Lifestyle Changes diet recommendations, which includes a discussion on lipid goals, dietary fat, sodium, fiber, plant stanol/sterol esters, sugar, and the components of a well-balanced, healthy diet.   Nutrition II class: Lifestyle Skills:  -Group instruction provided by PowerPoint slides, verbal discussion, and written materials to support subject matter. The instructor gives an explanation and review of label reading, grocery shopping for heart health, heart healthy recipe modifications, and ways to make healthier choices when eating out.   Diabetes Question & Answer:  -Group instruction provided by PowerPoint slides, verbal discussion, and written materials to support subject matter. The instructor gives an explanation and review of diabetes  co-morbidities, pre- and post-prandial blood glucose goals, pre-exercise blood glucose goals, signs, symptoms, and treatment of hypoglycemia and hyperglycemia, and foot care basics.   Diabetes Blitz:  -Group instruction provided by PowerPoint slides, verbal discussion, and written materials to support subject matter. The instructor gives an explanation and review of the physiology behind type 1 and type 2 diabetes, diabetes medications and rational behind using different medications, pre- and post-prandial blood glucose recommendations and Hemoglobin A1c goals, diabetes diet, and exercise including blood glucose guidelines for exercising safely.    Portion Distortion:  -Group instruction provided by PowerPoint slides, verbal discussion, written materials, and food models to support subject matter. The instructor gives an explanation of serving size versus portion size, changes in portions sizes over the last 20 years, and what consists of a serving from each food group.   Stress Management:  -Group instruction provided by verbal instruction, video, and written materials to support subject matter.  Instructors review role of stress in heart disease and how to cope with stress positively.     Exercising on Your Own:  -Group instruction provided by verbal instruction, power point, and written materials to support subject.  Instructors discuss benefits of exercise, components of exercise, frequency and intensity of exercise, and end points for exercise.  Also discuss use of nitroglycerin and activating EMS.  Review options of places to exercise outside of rehab.  Review guidelines for sex with heart disease.   Cardiac Drugs I:  -Group instruction provided by verbal instruction and written materials to support subject.  Instructor reviews cardiac drug classes: antiplatelets, anticoagulants, beta blockers, and statins.  Instructor discusses reasons, side effects, and lifestyle considerations for each  drug class.   Cardiac Drugs II:  -Group instruction provided by verbal instruction and written materials to support subject.  Instructor reviews cardiac drug classes: angiotensin converting enzyme inhibitors (ACE-I), angiotensin II receptor blockers (ARBs), nitrates, and calcium channel blockers.  Instructor discusses reasons, side effects, and lifestyle considerations for each drug class.   Anatomy and Physiology of the Circulatory System:  Group verbal and written instruction and models provide basic cardiac anatomy and physiology, with the coronary electrical and arterial systems. Review of: AMI, Angina, Valve disease, Heart Failure, Peripheral Artery Disease, Cardiac Arrhythmia, Pacemakers, and the ICD.   Other Education:  -  Group or individual verbal, written, or video instructions that support the educational goals of the cardiac rehab program.   Holiday Eating Survival Tips:  -Group instruction provided by PowerPoint slides, verbal discussion, and written materials to support subject matter. The instructor gives patients tips, tricks, and techniques to help them not only survive but enjoy the holidays despite the onslaught of food that accompanies the holidays.   Knowledge Questionnaire Score: Knowledge Questionnaire Score - 07/11/19 1550      Knowledge Questionnaire Score   Pre Score  16/24       Core Components/Risk Factors/Patient Goals at Admission: Personal Goals and Risk Factors at Admission - 07/03/19 1536      Core Components/Risk Factors/Patient Goals on Admission    Weight Management  Yes    Intervention  Weight Management: Develop a combined nutrition and exercise program designed to reach desired caloric intake, while maintaining appropriate intake of nutrient and fiber, sodium and fats, and appropriate energy expenditure required for the weight goal.;Weight Management: Provide education and appropriate resources to help participant work on and attain dietary  goals.;Weight Management/Obesity: Establish reasonable short term and long term weight goals.;Obesity: Provide education and appropriate resources to help participant work on and attain dietary goals.    Admit Weight  174 lb 12.8 oz (79.3 kg)    Expected Outcomes  Short Term: Continue to assess and modify interventions until short term weight is achieved;Long Term: Adherence to nutrition and physical activity/exercise program aimed toward attainment of established weight goal;Weight Loss: Understanding of general recommendations for a balanced deficit meal plan, which promotes 1-2 lb weight loss per week and includes a negative energy balance of [phone] kcal/d;Understanding recommendations for meals to include 15-35% energy as protein, 25-35% energy from fat, 35-60% energy from carbohydrates, less than 200mg  of dietary cholesterol, 20-35 gm of total fiber daily;Understanding of distribution of calorie intake throughout the day with the consumption of 4-5 meals/snacks    Hypertension  Yes    Intervention  Provide education on lifestyle modifcations including regular physical activity/exercise, weight management, moderate sodium restriction and increased consumption of fresh fruit, vegetables, and low fat dairy, alcohol moderation, and smoking cessation.;Monitor prescription use compliance.    Expected Outcomes  Short Term: Continued assessment and intervention until BP is < 140/96mm HG in hypertensive participants. < 130/58mm HG in hypertensive participants with diabetes, heart failure or chronic kidney disease.;Long Term: Maintenance of blood pressure at goal levels.    Lipids  Yes    Intervention  Provide education and support for participant on nutrition & aerobic/resistive exercise along with prescribed medications to achieve LDL 70mg , HDL >40mg .    Expected Outcomes  Short Term: Participant states understanding of desired cholesterol values and is compliant with medications prescribed. Participant is  following exercise prescription and nutrition guidelines.;Long Term: Cholesterol controlled with medications as prescribed, with individualized exercise RX and with personalized nutrition plan. Value goals: LDL < 70mg , HDL > 40 mg.       Core Components/Risk Factors/Patient Goals Review:  Goals and Risk Factor Review    Row Name 07/09/19 1558             Core Components/Risk Factors/Patient Goals Review   Personal Goals Review  Weight Management/Obesity;Lipids;Hypertension       Review  Ms. Eure has multiple CAD risk factors. She is very eager to participate in CR for education and risk factor modification. Her goals are to feel better, have more energy, less shortness of breath, and to improve her balance.  Expected Outcomes  Patient will continue to participate in CR for education and risk factor modification. She will meet her personal goals at the end of program.          Core Components/Risk Factors/Patient Goals at Discharge (Final Review):  Goals and Risk Factor Review - 07/09/19 1558      Core Components/Risk Factors/Patient Goals Review   Personal Goals Review  Weight Management/Obesity;Lipids;Hypertension    Review  Ms. Lantagne has multiple CAD risk factors. She is very eager to participate in CR for education and risk factor modification. Her goals are to feel better, have more energy, less shortness of breath, and to improve her balance.    Expected Outcomes  Patient will continue to participate in CR for education and risk factor modification. She will meet her personal goals at the end of program.       ITP Comments: ITP Comments    Row Name 07/03/19 1417 07/09/19 1553         ITP Comments  Dr. Fransico Him, Medical Director Cardiac Rehab [facility]  30 day ITP review: Ms. Wait completed her first cardiac rehab exercise session today and tolerated very well. VSS. Patient denied complaints. Able to complete initial exercise prescription without  modifications. Worked to an RPE of 11-12.         Comments: See ITP comments

## 2019-07-09 NOTE — Progress Notes (Signed)
Daily Session Note  Patient Details  Name: Sheila Gardner MRN: [id number] Date of Birth: [birth year] Referring Provider:     CARDIAC REHAB PHASE II ORIENTATION from 07/03/2019 in [place]  Referring Provider  Belva Crome      Encounter Date: 07/09/2019  Check In: Session Check In - 07/09/19 1356      Check-In   Supervising physician immediately available to respond to emergencies  [facility] immediately available    Physician(s)  Dr. Sherral Hammers    Location  [facility]    Staff Present  Seward Carol, MS, ACSM CEP, Exercise Physiologist;Other;Kumari Sculley Rollene Rotunda, RN, Mosie Epstein, MS,ACSM CEP, Exercise Physiologist;Maria Whitaker, RN, BSN    Virtual Visit  No    Medication changes reported      No    Fall or balance concerns reported     No    Tobacco Cessation  No Change    Warm-up and Cool-down  Performed on first and last piece of equipment    Resistance Training Performed  Yes    VAD Patient?  No    PAD/SET Patient?  No      Pain Assessment   Currently in Pain?  No/denies    Pain Score  0-No pain    Multiple Pain Sites  No       Capillary Blood Glucose: No results found for this or any previous visit (from the past 24 hour(s)).  Exercise Prescription Changes - 07/09/19 1354      Response to Exercise   Blood Pressure (Admit)  124/78    Blood Pressure (Exercise)  168/82    Blood Pressure (Exit)  130/68    Heart Rate (Admit)  56 bpm    Heart Rate (Exercise)  81 bpm    Heart Rate (Exit)  57 bpm    Rating of Perceived Exertion (Exercise)  12    Symptoms  Patient c/o left knee pain.    Comments  Off to a good start with exercise with some left knee pain.    Duration  Continue with 30 min of aerobic exercise without signs/symptoms of physical distress.    Intensity  THRR unchanged      Progression   Progression  Continue to progress workloads to maintain intensity without signs/symptoms of physical distress.    Average METs  2.7      Resistance Training   Training Prescription  Yes    Weight  2    Reps  10-15    Time  10 Minutes      Interval Training   Interval Training  No      Recumbant Bike   Level  1    Watts  18    Minutes  15    METs  2.7      NuStep   Level  2    SPM  92    Minutes  15    METs  2.6       Social History   Tobacco Use  Smoking Status Never Smoker  Smokeless Tobacco Never Used    Goals Met:  Exercise tolerated well No report of cardiac concerns or symptoms Strength training completed today  Goals Unmet:  Not Applicable  Comments: Pt started cardiac rehab today.  Pt tolerated light exercise without difficulty. VSS, telemetry-NSR, asymptomatic.  Medication list reconciled. Pt denies barriers to medicaiton compliance.  PSYCHOSOCIAL ASSESSMENT:  PHQ-0. Pt exhibits positive coping skills, hopeful outlook with supportive  family. No psychosocial needs identified at this time, no psychosocial interventions necessary.  Pt oriented to exercise equipment and routine. Understanding verbalized.   Dr. Fransico Him is Medical Director for Cardiac Rehab at [facility]

## 2019-07-11 ENCOUNTER — Other Ambulatory Visit (HOSPITAL_COMMUNITY): Payer: Self-pay

## 2019-07-11 ENCOUNTER — Other Ambulatory Visit: Payer: Self-pay

## 2019-07-11 ENCOUNTER — Encounter (HOSPITAL_COMMUNITY)
Admit: 2019-07-11 | Discharge: 2019-07-11 | Disposition: A | Discharge status: Home / Self Care | Payer: Medicare Other | Attending: Interventional Cardiology | Admitting: Interventional Cardiology

## 2019-07-11 DIAGNOSIS — Z955 Presence of coronary angioplasty implant and graft: Secondary | ICD-10-CM

## 2019-07-11 DIAGNOSIS — Z7901 Long term (current) use of anticoagulants: Secondary | ICD-10-CM | POA: Diagnosis not present

## 2019-07-11 DIAGNOSIS — Z79899 Other long term (current) drug therapy: Secondary | ICD-10-CM | POA: Diagnosis not present

## 2019-07-11 DIAGNOSIS — K219 Gastro-esophageal reflux disease without esophagitis: Secondary | ICD-10-CM | POA: Diagnosis not present

## 2019-07-11 DIAGNOSIS — I10 Essential (primary) hypertension: Secondary | ICD-10-CM | POA: Diagnosis not present

## 2019-07-11 DIAGNOSIS — E785 Hyperlipidemia, unspecified: Secondary | ICD-10-CM | POA: Diagnosis not present

## 2019-07-11 NOTE — Procedures (Signed)
    Patient Name: Sheila Gardner, [place] Date: 06/30/2019 Gender: Female D.O.B: [birth year] Age (years): 84 Referring Provider: Daneen Schick Height (inches): 65 Interpreting Physician: Fransico Him MD, ABSM Weight (lbs): 176 RPSGT: Lanae Boast BMI: 30 MRN: [id number] Neck Size: 15.00  CLINICAL INFORMATION The patient is referred for a BiPAP titration to treat sleep apnea.  SLEEP STUDY TECHNIQUE As per the AASM Manual for the Scoring of Sleep and Associated Events v2.3 (April 2016) with a hypopnea requiring 4% desaturations.  The channels recorded and monitored were frontal, central and occipital EEG, electrooculogram (EOG), submentalis EMG (chin), nasal and oral airflow, thoracic and abdominal wall motion, anterior tibialis EMG, snore microphone, electrocardiogram, and pulse oximetry. Bilevel positive airway pressure (BPAP) was initiated at the beginning of the study and titrated to treat sleep-disordered breathing.  MEDICATIONS Medications self-administered by patient taken the night of the study : N/A  RESPIRATORY PARAMETERS Optimal IPAP Pressure (cm): 17  AHI at Optimal Pressure (/hr) 6.6 Optimal EPAP Pressure (cm):13  Overall Minimal O2 (%):89.0  inimal O2 at Optimal Pressure (%): 94.0  SLEEP ARCHITECTURE Start Time:9:54:56 PM Stop Time:5:00:18 AM  Total Time (min):425.4  Total Sleep Time (min):287 Sleep Latency (min):85.8 Sleep Efficiency (%):67.5%  REM Latency (min):54.0  WASO (min): 52.6 Stage N1 (%):11.8%  Stage N2 (%): 71.1%  Stage N3 (%): 1.6%  Stage R (%): 15.5 Supine (%):94.89  Arousal Index (/hr):15.5   CARDIAC DATA The 2 lead EKG demonstrated sinus rhythm. The mean heart rate was 45.1 beats per minute. Other EKG findings include: PAT. LEG MOVEMENT DATA The total Periodic Limb Movements of Sleep (PLMS) were 0. The PLMS index was 0.0. A PLMS index of <15 is considered normal in adults.  IMPRESSIONS - An optimal PAP pressure was selected for this  patient ( 17 / cm of water) - Severe Central Sleep Apnea was noted during this titration (CAI = 42.9/h). - Mild oxygen desaturations were observed during this titration (min O2 = 89.0%). - The patient snored with moderate snoring volume. - PAT was observed during this study. - Clinically significant periodic limb movements were not noted during this study. Arousals associated with PLMs were rare.  DIAGNOSIS - Obstructive Sleep Apnea (327.23 [G47.33 ICD-10])  RECOMMENDATIONS - Trial of BiPAP therapy on 17/13 cm H2O with a Small size Resmed Full Face Mask AirFit F20 mask and heated humidification. - Avoid alcohol, sedatives and other CNS depressants that may worsen sleep apnea and disrupt normal sleep architecture. - Sleep hygiene should be reviewed to assess factors that may improve sleep quality. - Weight management and regular exercise should be initiated or continued. - Return to Sleep Center for re-evaluation after 8 weeks of therapy  [Electronically signed] 07/11/2019 04:12 PM  Fransico Him MD, ABSM Diplomate, American Board of Sleep Medicine

## 2019-07-12 ENCOUNTER — Telehealth: Payer: Self-pay | Admitting: Interventional Cardiology

## 2019-07-12 NOTE — Telephone Encounter (Signed)
Pt c/o Shortness Of Breath: STAT if SOB developed within the last 24 hours or pt is noticeably SOB on the phone  1. Are you currently SOB (can you hear that pt is SOB on the phone)? She, states she is currently SOB.   2. How long have you been experiencing SOB? One week, yesterday and today has been worse  3. Are you SOB when sitting or when up moving around? both  4. Are you currently experiencing any other symptoms? Just feeling tired.  She states her HR is 42.

## 2019-07-12 NOTE — Telephone Encounter (Signed)
Spoke with pt and she states she has had intermittent SOB x 1 week.  Last 2 days this has worsened.  Feels like she is in AF.  HR has been around 42 all week.  Advised pt if she is in AF, Pulse ox/BP cuff can not properly read her HR.  Pt denies any other sx other than fatigue.  Moved pt's appt with Dr. Lovena Le up to tomorrow at 2pm.  Pt appreciative for call.

## 2019-07-13 ENCOUNTER — Telehealth (HOSPITAL_COMMUNITY): Payer: Self-pay | Admitting: Family Medicine

## 2019-07-13 ENCOUNTER — Encounter: Payer: Self-pay | Admitting: Internal Medicine

## 2019-07-13 ENCOUNTER — Other Ambulatory Visit: Payer: Self-pay

## 2019-07-13 ENCOUNTER — Ambulatory Visit (INDEPENDENT_AMBULATORY_CARE_PROVIDER_SITE_OTHER): Payer: Medicare Other | Admitting: Internal Medicine

## 2019-07-13 ENCOUNTER — Telehealth: Payer: Self-pay | Admitting: Internal Medicine

## 2019-07-13 ENCOUNTER — Telehealth: Payer: Self-pay | Admitting: *Deleted

## 2019-07-13 ENCOUNTER — Encounter (HOSPITAL_COMMUNITY): Payer: Medicare Other

## 2019-07-13 VITALS — BP 144/66 | HR 51 | Ht 64.0 in | Wt 179.0 lb

## 2019-07-13 DIAGNOSIS — I4819 Other persistent atrial fibrillation: Secondary | ICD-10-CM | POA: Diagnosis not present

## 2019-07-13 DIAGNOSIS — I2584 Coronary atherosclerosis due to calcified coronary lesion: Secondary | ICD-10-CM | POA: Diagnosis not present

## 2019-07-13 DIAGNOSIS — I2510 Atherosclerotic heart disease of native coronary artery without angina pectoris: Secondary | ICD-10-CM | POA: Diagnosis not present

## 2019-07-13 MED ORDER — DOFETILIDE 250 MCG PO CAPS: 250.0000 ug | ORAL_CAPSULE | Freq: Two times a day (BID) | ORAL | 11 refills | Status: DC

## 2019-07-13 NOTE — Patient Instructions (Addendum)
Medication Instructions:  Your physician has recommended you make the following change in your medication:  1.  You will decrease your dofetilide--Reduce your dofetilide to 250 mcg by mouth twice a day   Labwork: You will get lab work today:  BMP and CBC and magnesium  Testing/Procedures: None ordered.  Follow-Up: Your physician wants you to follow-up based on lab results.  Any Other Special Instructions Will Be Listed Below (If Applicable).  If you need a refill on your cardiac medications before your next appointment, please call your pharmacy.

## 2019-07-13 NOTE — Telephone Encounter (Signed)
-----   Message from Sueanne Margarita, MD sent at 07/11/2019  4:15 PM EDT ----- Please let patient know that they had a successful PAP titration and let DME know that orders are in EPIC.  Please set up 8 week OV with me.

## 2019-07-13 NOTE — Telephone Encounter (Signed)
New message   Patient states that her son will be coming with her to appt today due to be hearing impaired.

## 2019-07-13 NOTE — Telephone Encounter (Signed)
Informed patient of sleep study results and patient understanding was verbalized.  Patient understands HER sleep study showed they had a successful PAP titration and let DME know that orders are in EPIC. Please set up 8 week OV with me.    Upon patient request DME selection is [facility]. Patient understands SHE will be contacted by [facility] to set up HER cpap. Patient understands to call if [facility] does not contact HER with new setup in a timely manner. Patient understands they will be called once confirmation has been received from [facility] that they have received their new machine to schedule 10 week follow up appointment.  [facility] notified of new cpap order  Please add to airview Patient was grateful for the call and thanked me.

## 2019-07-13 NOTE — Progress Notes (Signed)
HPI Sheila Gardner returns today for followup. She is a pleasant 84 yo woman with persistent atrial fib, who was admitted over 4 weeks ago for initiation of dofetilide. She has developed worsening sob. No chest pain and she has minimal palpitations. No edema. No syncope. She denies dietary indiscretion. She has not had any change in her meds. Her sob has gotten worse in the past week.   No Known Allergies   Current Outpatient Medications  Medication Sig Dispense Refill  . acetaminophen (TYLENOL) 650 MG CR tablet Take 1,300 mg by mouth every 8 (eight) hours as needed for pain.    . Ascorbic Acid (VITAMIN C) 1000 MG tablet Take 1,000 mg by mouth daily.     Marland Kitchen BIOTIN PO Take 10,000 Units by mouth daily with breakfast.     . Cholecalciferol (VITAMIN D-3) 1000 UNITS CAPS Take 1,000 Units by mouth daily with breakfast.     . cyanocobalamin 1000 MCG tablet Take 1,000 mcg by mouth daily.     Marland Kitchen dofetilide (TIKOSYN) 500 MCG capsule Take 1 capsule (500 mcg total) by mouth 2 (two) times daily. 60 capsule 6  . ELIQUIS 5 MG TABS tablet Take 5 mg by mouth 2 (two) times daily.    Marland Kitchen esomeprazole (NEXIUM) 20 MG capsule Take 20 mg by mouth daily at 12 noon.    . ferrous sulfate 325 (65 FE) MG tablet Take 325 mg by mouth daily with breakfast.    . LORazepam (ATIVAN) 1 MG tablet Take 1 mg by mouth at bedtime.     Marland Kitchen losartan (COZAAR) 100 MG tablet Take 100 mg by mouth daily.     . magnesium oxide (MAG-OX) 400 (241.3 Mg) MG tablet Take 1 tablet (400 mg total) by mouth daily. 30 tablet 6  . potassium chloride (KLOR-CON) 10 MEQ tablet Take 4 tablets (40 mEq total) by mouth daily. 120 tablet 6  . prednisoLONE acetate (PRED FORTE) 1 % ophthalmic suspension Place 1 drop into both eyes daily as needed (Burning).     . pyridOXINE (VITAMIN B-6) 100 MG tablet Take 100 mg by mouth daily.    . rosuvastatin (CRESTOR) 40 MG tablet Take 1 tablet (40 mg total) by mouth daily at 6 PM. 30 tablet 6  . ticagrelor (BRILINTA) 90  MG TABS tablet Take 1 tablet (90 mg total) by mouth 2 (two) times daily. 60 tablet 11  . vitamin E 400 UNIT capsule Take 400 Units by mouth daily.     No current facility-administered medications for this visit.     Past Medical History:  Diagnosis Date  . Arthritis   . Atrial fibrillation ([place])   . Breast cancer ([place])   . Cancer ([place])    Breast- rt  . Cough with sputum 2016  . Elevated liver function tests 05/09/2013  . GERD (gastroesophageal reflux disease)    COSTOCHONDRITIS  . Heart murmur   . Hip pain 05/08/2014  . Hyperlipidemia   . Hypertension   . Lipoma    back of neck  . PONV (postoperative nausea and vomiting)    only after rt hip surgery  . Primary osteoarthritis of left knee 11/04/2015  . Primary osteoarthritis of right hip 07/30/2014    ROS:   All systems reviewed and negative except as noted in the HPI.   Past Surgical History:  Procedure Laterality Date  . ANKLE FRACTURE SURGERY Right 2013   RIGHT   . BREAST LUMPECTOMY Right 2011  . BREAST  SURGERY  04/22/2009   Rt lumpectomy  . CARDIOVERSION N/A 02/19/2019   Procedure: CARDIOVERSION;  Surgeon: Dorothy Spark, MD;  Location: [facility] ENDOSCOPY;  Service: Cardiovascular;  Laterality: N/A;  . CORONARY ATHERECTOMY N/A 04/30/2019   Procedure: CORONARY ATHERECTOMY;  Surgeon: Belva Crome, MD;  Location: [place] CV LAB;  Service: Cardiovascular;  Laterality: N/A;  LAD  . CORONARY STENT INTERVENTION N/A 04/30/2019   Procedure: CORONARY STENT INTERVENTION;  Surgeon: Belva Crome, MD;  Location: [place] CV LAB;  Service: Cardiovascular;  Laterality: N/A;  DES TO PROX-MID LAD  . EYE SURGERY  2001   macular hole  . JOINT REPLACEMENT  2016  . KNEE SURGERY    . LEFT HEART CATH AND CORONARY ANGIOGRAPHY N/A 04/30/2019   Procedure: LEFT HEART CATH AND CORONARY ANGIOGRAPHY;  Surgeon: Belva Crome, MD;  Location: [place] CV LAB;  Service: Cardiovascular;  Laterality: N/A;  . MIDDLE EAR SURGERY Left 08/18/2015    repair eardrum and reconstruction  . OVARY SURGERY  40 years ago   wedge  . TOTAL HIP ARTHROPLASTY Right 07/30/2014   Procedure: TOTAL HIP ARTHROPLASTY ANTERIOR APPROACH;  Surgeon: Melrose Nakayama, MD;  Location: [place];  Service: Orthopedics;  Laterality: Right;  . TOTAL KNEE ARTHROPLASTY Left 11/04/2015  . TOTAL KNEE ARTHROPLASTY Left 11/04/2015   Procedure: TOTAL KNEE ARTHROPLASTY;  Surgeon: Melrose Nakayama, MD;  Location: [place];  Service: Orthopedics;  Laterality: Left;  . TYMPANOPLASTY  30 years ago     Family History  Problem Relation Age of Onset  . Heart disease Mother   . Cancer Brother        colon, pancreatic, brain     Social History   Socioeconomic History  . Marital status: Widowed    Spouse name: Not on file  . Number of children: Not on file  . Years of education: Not on file  . Highest education level: Not on file  Occupational History  . Not on file  Tobacco Use  . Smoking status: Never Smoker  . Smokeless tobacco: Never Used  Substance and Sexual Activity  . Alcohol use: Not Currently  . Drug use: No  . Sexual activity: Not on file  Other Topics Concern  . Not on file  Social History Narrative  . Not on file   Social Determinants of Health   Financial Resource Strain:   . Difficulty of Paying Living Expenses:   Food Insecurity:   . Worried About Charity fundraiser in the Last Year:   . Arboriculturist in the Last Year:   Transportation Needs:   . Film/video editor (Medical):   Marland Kitchen Lack of Transportation (Non-Medical):   Physical Activity:   . Days of Exercise per Week:   . Minutes of Exercise per Session:   Stress:   . Feeling of Stress :   Social Connections:   . Frequency of Communication with Friends and Family:   . Frequency of Social Gatherings with Friends and Family:   . Attends Religious Services:   . Active Member of Clubs or Organizations:   . Attends Archivist Meetings:   Marland Kitchen Marital Status:   Intimate Partner  Violence:   . Fear of Current or Ex-Partner:   . Emotionally Abused:   Marland Kitchen Physically Abused:   . Sexually Abused:      BP (!) 144/66   Pulse (!) 51   Ht 5\' 4"  (1.626 m)   Wt 179 lb (  81.2 kg)   SpO2 97%   BMI 30.73 kg/m   Physical Exam:  Well appearing NAD HEENT: Unremarkable Neck:  No JVD, no thyromegally Lymphatics:  No adenopathy Back:  No CVA tenderness Lungs:  Clear with no wheezes HEART:  Regular rate rhythm, no murmurs, no rubs, no clicks Abd:  soft, positive bowel sounds, no organomegally, no rebound, no guarding Ext:  2 plus pulses, no edema, no cyanosis, no clubbing Skin:  No rashes no nodules Neuro:  CN II through XII intact, motor grossly intact  EKG - sinus bradycardia with prolongation of the QT  Assess/Plan: 1. PAF - she is maintaining NSR. Unfortunately her QT is too long. We will reduce her dose of dofetilide to 250 bid. 2. Dyspnea - the etiology is unclear. I will ask her to obtain some blood work. I would anticipate we start her on lasix but we will make sure that her symptoms are not due to anemia. 3. Dyslipidemia - she will continue her crestor 4. Sinus node dysfunction - she is maintaining heart rates in the 50's.   Mikle Bosworth.D.

## 2019-07-14 LAB — BASIC METABOLIC PANEL
BUN/Creatinine Ratio: 21 (ref 12–28)
BUN: 13 mg/dL (ref 8–27)
CO2: 19 mmol/L — ABNORMAL LOW (ref 20–29)
Calcium: 10.1 mg/dL (ref 8.7–10.3)
Chloride: 100 mmol/L (ref 96–106)
Creatinine, Ser: 0.63 mg/dL (ref 0.57–1.00)
GFR calc Af Amer: 97 mL/min/{1.73_m2} (ref >59)
GFR calc non Af Amer: 84 mL/min/{1.73_m2} (ref >59)
Glucose: 119 mg/dL — ABNORMAL HIGH (ref 65–99)
Potassium: 4.3 mmol/L (ref 3.5–5.2)
Sodium: 139 mmol/L (ref 134–144)

## 2019-07-14 LAB — CBC WITH DIFFERENTIAL/PLATELET
Basophils Absolute: 0 10*3/uL (ref 0.0–0.2)
Basos: 0 %
EOS (ABSOLUTE): 0.1 10*3/uL (ref 0.0–0.4)
Eos: 1 %
Hematocrit: 37.3 % (ref 34.0–46.6)
Hemoglobin: 12.5 g/dL (ref 11.1–15.9)
Immature Grans (Abs): 0 10*3/uL (ref 0.0–0.1)
Immature Granulocytes: 0 %
Lymphocytes Absolute: 2.7 10*3/uL (ref 0.7–3.1)
Lymphs: 29 %
MCH: 31 pg (ref 26.6–33.0)
MCHC: 33.5 g/dL (ref 31.5–35.7)
MCV: 93 fL (ref 79–97)
Monocytes Absolute: 1.2 10*3/uL — ABNORMAL HIGH (ref 0.1–0.9)
Monocytes: 13 %
Neutrophils Absolute: 5.3 10*3/uL (ref 1.4–7.0)
Neutrophils: 57 %
Platelets: 311 10*3/uL (ref 150–450)
RBC: 4.03 x10E6/uL (ref 3.77–5.28)
RDW: 11.9 % (ref 11.7–15.4)
WBC: 9.3 10*3/uL (ref 3.4–10.8)

## 2019-07-14 LAB — MAGNESIUM: Magnesium: 2.3 mg/dL (ref 1.6–2.3)

## 2019-07-16 ENCOUNTER — Encounter (HOSPITAL_COMMUNITY)
Admit: 2019-07-16 | Discharge: 2019-07-16 | Disposition: A | Discharge status: Home / Self Care | Payer: Medicare Other | Attending: Interventional Cardiology | Admitting: Interventional Cardiology

## 2019-07-16 ENCOUNTER — Telehealth: Payer: Self-pay | Admitting: Internal Medicine

## 2019-07-16 ENCOUNTER — Other Ambulatory Visit: Payer: Self-pay

## 2019-07-16 DIAGNOSIS — Z79899 Other long term (current) drug therapy: Secondary | ICD-10-CM | POA: Diagnosis not present

## 2019-07-16 DIAGNOSIS — K219 Gastro-esophageal reflux disease without esophagitis: Secondary | ICD-10-CM | POA: Diagnosis not present

## 2019-07-16 DIAGNOSIS — E785 Hyperlipidemia, unspecified: Secondary | ICD-10-CM | POA: Diagnosis not present

## 2019-07-16 DIAGNOSIS — Z955 Presence of coronary angioplasty implant and graft: Secondary | ICD-10-CM | POA: Diagnosis not present

## 2019-07-16 DIAGNOSIS — I10 Essential (primary) hypertension: Secondary | ICD-10-CM | POA: Diagnosis not present

## 2019-07-16 DIAGNOSIS — Z7901 Long term (current) use of anticoagulants: Secondary | ICD-10-CM | POA: Diagnosis not present

## 2019-07-16 MED ORDER — FUROSEMIDE 40 MG PO TABS
40.0000 mg | ORAL_TABLET | Freq: Every day | ORAL | 3 refills | Status: DC
Start: 2019-07-16 — End: 2020-07-11

## 2019-07-16 NOTE — Telephone Encounter (Signed)
Pt with continued issues with SOB.  Do you think it could possibly be the Brilinta?  She's had continuous intermittent issues with dyspnea since starting the Brilinta.

## 2019-07-16 NOTE — Telephone Encounter (Signed)
Returned call to Pt.  Advised all her lab work was normal and Dr. Lovena Le is suggesting she start on lasix 40 mg daily.  Per Pt she actually has felt well since she saw Dr. Lovena Le.  She is wondering if perhaps the Brilinta she is taking is causing the sob?  Advised would let Dr. Thompson Caul nurse know she was having some sob since taking Brilinta.  Pt will pick up lasix.  Advised to just take as needed at this time.  Will discuss further with Dr. Lovena Le.

## 2019-07-16 NOTE — Telephone Encounter (Signed)
Sheila Gardner is calling requesting Sheila Gardner give her a call in regards to her lab results that were taken on 07/13/19 as well as if her appointment that was originally scheduled for tomorrow 07/17/19 that was canceled is still needed. Please advise.

## 2019-07-17 ENCOUNTER — Ambulatory Visit: Payer: Medicare Other | Admitting: Internal Medicine

## 2019-07-17 DIAGNOSIS — H9313 Tinnitus, bilateral: Secondary | ICD-10-CM | POA: Diagnosis not present

## 2019-07-17 DIAGNOSIS — H9072 Mixed conductive and sensorineural hearing loss, unilateral, left ear, with unrestricted hearing on the contralateral side: Secondary | ICD-10-CM | POA: Diagnosis not present

## 2019-07-17 DIAGNOSIS — H838X3 Other specified diseases of inner ear, bilateral: Secondary | ICD-10-CM | POA: Diagnosis not present

## 2019-07-17 NOTE — Telephone Encounter (Signed)
When is she SOB? All the time? With activity? Or, Within 1-2 hours of each Brilinta dose.?

## 2019-07-18 ENCOUNTER — Encounter (HOSPITAL_COMMUNITY)
Admit: 2019-07-18 | Discharge: 2019-07-18 | Disposition: A | Discharge status: Home / Self Care | Payer: Medicare Other | Attending: Interventional Cardiology | Admitting: Interventional Cardiology

## 2019-07-18 ENCOUNTER — Other Ambulatory Visit: Payer: Self-pay

## 2019-07-18 DIAGNOSIS — K219 Gastro-esophageal reflux disease without esophagitis: Secondary | ICD-10-CM | POA: Diagnosis not present

## 2019-07-18 DIAGNOSIS — I10 Essential (primary) hypertension: Secondary | ICD-10-CM | POA: Diagnosis not present

## 2019-07-18 DIAGNOSIS — Z7901 Long term (current) use of anticoagulants: Secondary | ICD-10-CM | POA: Diagnosis not present

## 2019-07-18 DIAGNOSIS — Z955 Presence of coronary angioplasty implant and graft: Secondary | ICD-10-CM

## 2019-07-18 DIAGNOSIS — Z79899 Other long term (current) drug therapy: Secondary | ICD-10-CM | POA: Diagnosis not present

## 2019-07-18 DIAGNOSIS — E785 Hyperlipidemia, unspecified: Secondary | ICD-10-CM | POA: Diagnosis not present

## 2019-07-18 NOTE — Telephone Encounter (Signed)
Left message to call back  

## 2019-07-18 NOTE — Telephone Encounter (Signed)
Yes, I am her "general cardiologist" and will need to follow her long-term.  For now comanagement with EP is occurring.  If her breathing is better after starting furosemide, no need to change Brilinta.

## 2019-07-18 NOTE — Telephone Encounter (Signed)
Pt started taking Lasix as prescribed by Dr. Lovena Le and states SOB has improved tremendously.  She feels the best she has felt since the stent was put in.  States SOB was occurring all the time, regardless of whether she was exerting or just sitting down.  Advised pt to continue medications for now and I would update Dr. [name] to see if any further recommendations.  Pt also asked about when she is suppose to see Dr. [name] again.  Pt currently has no follow up scheduled with Dr. [name] because she has been doing a lot with the EP dept.  Advised I will find out when Dr. [name] would like to see her back.

## 2019-07-18 NOTE — Progress Notes (Signed)
Sheila Gardner 84 y.o. female Nutrition Note  Visit Diagnosis: Status post coronary artery stent placement  Past Medical History:  Diagnosis Date  . Arthritis   . Atrial fibrillation ([place])   . Breast cancer ([place])   . Cancer ([place])    Breast- rt  . Cough with sputum 2016  . Elevated liver function tests 05/09/2013  . GERD (gastroesophageal reflux disease)    COSTOCHONDRITIS  . Heart murmur   . Hip pain 05/08/2014  . Hyperlipidemia   . Hypertension   . Lipoma    back of neck  . PONV (postoperative nausea and vomiting)    only after rt hip surgery  . Primary osteoarthritis of left knee 11/04/2015  . Primary osteoarthritis of right hip 07/30/2014     Medications reviewed.   Current Outpatient Medications:  .  acetaminophen (TYLENOL) 650 MG CR tablet, Take 1,300 mg by mouth every 8 (eight) hours as needed for pain., Disp: , Rfl:  .  Ascorbic Acid (VITAMIN C) 1000 MG tablet, Take 1,000 mg by mouth daily. , Disp: , Rfl:  .  BIOTIN PO, Take 10,000 Units by mouth daily with breakfast. , Disp: , Rfl:  .  Cholecalciferol (VITAMIN D-3) 1000 UNITS CAPS, Take 1,000 Units by mouth daily with breakfast. , Disp: , Rfl:  .  cyanocobalamin 1000 MCG tablet, Take 1,000 mcg by mouth daily. , Disp: , Rfl:  .  dofetilide (TIKOSYN) 250 MCG capsule, Take 1 capsule (250 mcg total) by mouth 2 (two) times daily., Disp: 60 capsule, Rfl: 11 .  ELIQUIS 5 MG TABS tablet, Take 5 mg by mouth 2 (two) times daily., Disp: , Rfl:  .  esomeprazole (NEXIUM) 20 MG capsule, Take 20 mg by mouth daily at 12 noon., Disp: , Rfl:  .  ferrous sulfate 325 (65 FE) MG tablet, Take 325 mg by mouth daily with breakfast., Disp: , Rfl:  .  furosemide (LASIX) 40 MG tablet, Take 1 tablet (40 mg total) by mouth daily., Disp: 90 tablet, Rfl: 3 .  LORazepam (ATIVAN) 1 MG tablet, Take 1 mg by mouth at bedtime. , Disp: , Rfl:  .  losartan (COZAAR) 100 MG tablet, Take 100 mg by mouth daily. , Disp: , Rfl:  .  magnesium oxide (MAG-OX) 400  (241.3 Mg) MG tablet, Take 1 tablet (400 mg total) by mouth daily., Disp: 30 tablet, Rfl: 6 .  potassium chloride (KLOR-CON) 10 MEQ tablet, Take 4 tablets (40 mEq total) by mouth daily., Disp: 120 tablet, Rfl: 6 .  prednisoLONE acetate (PRED FORTE) 1 % ophthalmic suspension, Place 1 drop into both eyes daily as needed (Burning). , Disp: , Rfl:  .  pyridOXINE (VITAMIN B-6) 100 MG tablet, Take 100 mg by mouth daily., Disp: , Rfl:  .  rosuvastatin (CRESTOR) 40 MG tablet, Take 1 tablet (40 mg total) by mouth daily at 6 PM., Disp: 30 tablet, Rfl: 6 .  ticagrelor (BRILINTA) 90 MG TABS tablet, Take 1 tablet (90 mg total) by mouth 2 (two) times daily., Disp: 60 tablet, Rfl: 11 .  vitamin E 400 UNIT capsule, Take 400 Units by mouth daily., Disp: , Rfl:    Ht Readings from Last 1 Encounters:  07/13/19 5\' 4"  (1.626 m)     Wt Readings from Last 3 Encounters:  07/13/19 179 lb (81.2 kg)  07/03/19 174 lb 13.2 oz (79.3 kg)  06/30/19 182 lb (82.6 kg)     There is no height or weight on file to calculate BMI.  Social History   Tobacco Use  Smoking Status Never Smoker  Smokeless Tobacco Never Used     No results found for: CHOL No results found for: HDL No results found for: LDLCALC No results found for: TRIG   No results found for: HGBA1C   CBG (last 3)  No results for input(s): GLUCAP in the last 72 hours.   Nutrition Note  Spoke with pt. Nutrition Plan and Nutrition Survey goals reviewed with pt.   Pt has Pre-diabetes. Last A1c indicates blood glucose well-controlled.  Pt states desire to reduce A1c  <5.7%, out of pre diabetes range. We discussed carbohydrate impact on CBGs. Reassured pt she can still eat carbohydrates. We reviewed portion sizes. Overall pt does have a generally healthy diet. She feels a plan would help her sustain a heart healthy diet. We reviewed mediterranean meal pattern and provided her with a food list to help her plan.    Pt expressed understanding of the  information reviewed.    Nutrition Diagnosis Excessive carbohydrate intake related to food preferences and lack of food related knowledge as evidenced by diet recall and fasting CBGs in prediabetic range (94 -126 mg/dl)  Nutrition Intervention ? Pt's individual nutrition plan reviewed with pt. ? Benefits of adopting Heart Healthy diet discussed when Medficts reviewed.  ? Continue client-centered nutrition education by RD, as part of interdisciplinary care.  Goal(s) ? Pt to build a healthy plate including vegetables, fruits, whole grains, and low-fat dairy products in a heart healthy meal plan. ? Pt to create a meal plan and grocery list with heart healthy foods   Plan:   Will provide client-centered nutrition education as part of interdisciplinary care  Monitor and evaluate progress toward nutrition goal with team.   Michaele Offer, MS, RDN, LDN

## 2019-07-18 NOTE — Telephone Encounter (Signed)
Follow up ° ° °Patient is returning your call. Please call. ° ° ° °

## 2019-07-18 NOTE — Telephone Encounter (Signed)
Spoke with Dr. [name] and he would like to see pt 6 mos post PCI.  Spoke with pt and scheduled her to be seen in August.  Pt appreciative for call.

## 2019-07-19 ENCOUNTER — Other Ambulatory Visit: Payer: Self-pay

## 2019-07-19 MED ORDER — ELIQUIS 5 MG PO TABS: 5.0000 mg | ORAL_TABLET | Freq: Two times a day (BID) | ORAL | 5 refills | Status: DC

## 2019-07-19 NOTE — Telephone Encounter (Signed)
Prescription refill request for Eliquis received.  Last office visit: 07/13/2019, Lovena Le Scr: 0.63, 07/13/2019 Age: 84 y.o. Weight: 81.2 kg  Prescription refill sent

## 2019-07-20 ENCOUNTER — Encounter (HOSPITAL_COMMUNITY)
Admit: 2019-07-20 | Discharge: 2019-07-20 | Disposition: A | Discharge status: Home / Self Care | Payer: Medicare Other | Attending: Interventional Cardiology | Admitting: Interventional Cardiology

## 2019-07-20 ENCOUNTER — Other Ambulatory Visit: Payer: Self-pay

## 2019-07-20 DIAGNOSIS — Z79899 Other long term (current) drug therapy: Secondary | ICD-10-CM | POA: Diagnosis not present

## 2019-07-20 DIAGNOSIS — Z955 Presence of coronary angioplasty implant and graft: Secondary | ICD-10-CM

## 2019-07-20 DIAGNOSIS — Z7901 Long term (current) use of anticoagulants: Secondary | ICD-10-CM | POA: Diagnosis not present

## 2019-07-20 DIAGNOSIS — Z23 Encounter for immunization: Secondary | ICD-10-CM | POA: Diagnosis not present

## 2019-07-20 DIAGNOSIS — E785 Hyperlipidemia, unspecified: Secondary | ICD-10-CM | POA: Diagnosis not present

## 2019-07-20 DIAGNOSIS — K219 Gastro-esophageal reflux disease without esophagitis: Secondary | ICD-10-CM | POA: Diagnosis not present

## 2019-07-20 DIAGNOSIS — I10 Essential (primary) hypertension: Secondary | ICD-10-CM | POA: Diagnosis not present

## 2019-07-23 ENCOUNTER — Other Ambulatory Visit: Payer: Self-pay

## 2019-07-23 ENCOUNTER — Encounter (HOSPITAL_COMMUNITY)
Admit: 2019-07-23 | Discharge: 2019-07-23 | Disposition: A | Discharge status: Home / Self Care | Payer: Medicare Other | Attending: Interventional Cardiology | Admitting: Interventional Cardiology

## 2019-07-23 DIAGNOSIS — Z955 Presence of coronary angioplasty implant and graft: Secondary | ICD-10-CM | POA: Diagnosis not present

## 2019-07-23 DIAGNOSIS — K219 Gastro-esophageal reflux disease without esophagitis: Secondary | ICD-10-CM | POA: Diagnosis not present

## 2019-07-23 DIAGNOSIS — Z7901 Long term (current) use of anticoagulants: Secondary | ICD-10-CM | POA: Diagnosis not present

## 2019-07-23 DIAGNOSIS — I10 Essential (primary) hypertension: Secondary | ICD-10-CM | POA: Diagnosis not present

## 2019-07-23 DIAGNOSIS — E785 Hyperlipidemia, unspecified: Secondary | ICD-10-CM | POA: Diagnosis not present

## 2019-07-23 DIAGNOSIS — Z79899 Other long term (current) drug therapy: Secondary | ICD-10-CM | POA: Diagnosis not present

## 2019-07-23 NOTE — Progress Notes (Signed)
Reviewed home exercise prescription with patient today. She is not currently walking to supplement her phase 2 C/R. Pt was encouraged to begin walking at home on days off from C/R. Components of safe exercise reviewed. Encouraged to always take precribed medications as directed before engaging in exercise. Stressed to carry cell phone and ID if walking outdoors. Appropriate clothing/shoe choices discussed. Weather parameters for temperature and humidity reviewed as well as hydration before, during and after exercise. Her THR range of 55-110 reviewed with the rationale for RPE scale rating of 11-13 with activity discussed. Warm-up and cool-down encouraged with walking 30-45 minutes 2-4 days/wk. Pt does not use NTG. Reviewed S/S that would require patient to not begin exercise or terminate exercise. Instructed if symptoms occur and resolve to call her physician. If symptoms do not resolve she should call 911. Pt verbalized understanding of exercise prescription and was provided copy.   Lesly Rubenstein, MS, ACSM EP-C, CCRP

## 2019-07-25 ENCOUNTER — Other Ambulatory Visit: Payer: Self-pay

## 2019-07-25 ENCOUNTER — Encounter (HOSPITAL_COMMUNITY)
Admit: 2019-07-25 | Discharge: 2019-07-25 | Disposition: A | Discharge status: Home / Self Care | Payer: Medicare Other | Attending: Interventional Cardiology | Admitting: Interventional Cardiology

## 2019-07-25 DIAGNOSIS — Z955 Presence of coronary angioplasty implant and graft: Secondary | ICD-10-CM

## 2019-07-25 DIAGNOSIS — I10 Essential (primary) hypertension: Secondary | ICD-10-CM | POA: Diagnosis not present

## 2019-07-25 DIAGNOSIS — Z7901 Long term (current) use of anticoagulants: Secondary | ICD-10-CM | POA: Diagnosis not present

## 2019-07-25 DIAGNOSIS — E785 Hyperlipidemia, unspecified: Secondary | ICD-10-CM | POA: Diagnosis not present

## 2019-07-25 DIAGNOSIS — Z79899 Other long term (current) drug therapy: Secondary | ICD-10-CM | POA: Diagnosis not present

## 2019-07-25 DIAGNOSIS — K219 Gastro-esophageal reflux disease without esophagitis: Secondary | ICD-10-CM | POA: Diagnosis not present

## 2019-07-26 ENCOUNTER — Telehealth: Payer: Self-pay | Admitting: Internal Medicine

## 2019-07-26 NOTE — Telephone Encounter (Signed)
This patient is having her teeth cleaned today. Dr. Irish Lack and I reviewed the chart and the patient does not need antibiotics. Patient aware.

## 2019-07-26 NOTE — Telephone Encounter (Signed)
New Message     1. What dental office are you calling from? [facility] [address] [place]  2. What is your office phone number? [phone]  3. What is your fax number? [phone]  4. What type of procedure is the patient having performed?  Cleaning and xrays   5. What date is procedure scheduled or is the patient there now? Pt is there now  (if the patient is at the dentist's office question goes to their cardiologist if he/she is in the office.  If not, question should go to the DOD).   6. What is your question (ex. Antibiotics prior to procedure, holding medication-we need to know how long dentist wants pt to hold med)? They need to know if she needs to pre med

## 2019-07-27 ENCOUNTER — Encounter (HOSPITAL_COMMUNITY)
Admit: 2019-07-27 | Discharge: 2019-07-27 | Disposition: A | Discharge status: Home / Self Care | Payer: Medicare Other | Attending: Interventional Cardiology | Admitting: Interventional Cardiology

## 2019-07-27 ENCOUNTER — Other Ambulatory Visit: Payer: Self-pay

## 2019-07-27 DIAGNOSIS — I10 Essential (primary) hypertension: Secondary | ICD-10-CM | POA: Diagnosis not present

## 2019-07-27 DIAGNOSIS — Z79899 Other long term (current) drug therapy: Secondary | ICD-10-CM | POA: Diagnosis not present

## 2019-07-27 DIAGNOSIS — Z955 Presence of coronary angioplasty implant and graft: Secondary | ICD-10-CM

## 2019-07-27 DIAGNOSIS — Z7901 Long term (current) use of anticoagulants: Secondary | ICD-10-CM | POA: Diagnosis not present

## 2019-07-27 DIAGNOSIS — E785 Hyperlipidemia, unspecified: Secondary | ICD-10-CM | POA: Diagnosis not present

## 2019-07-27 DIAGNOSIS — K219 Gastro-esophageal reflux disease without esophagitis: Secondary | ICD-10-CM | POA: Diagnosis not present

## 2019-07-30 ENCOUNTER — Telehealth: Payer: Self-pay | Admitting: Internal Medicine

## 2019-07-30 ENCOUNTER — Telehealth (HOSPITAL_COMMUNITY): Payer: Self-pay | Admitting: Family Medicine

## 2019-07-30 ENCOUNTER — Encounter (HOSPITAL_COMMUNITY): Payer: Medicare Other

## 2019-07-30 NOTE — Telephone Encounter (Signed)
Transferred call to cardiac rehab.

## 2019-08-01 ENCOUNTER — Encounter (HOSPITAL_COMMUNITY)
Admit: 2019-08-01 | Discharge: 2019-08-01 | Disposition: A | Discharge status: Home / Self Care | Payer: Medicare Other | Attending: Interventional Cardiology | Admitting: Interventional Cardiology

## 2019-08-01 ENCOUNTER — Other Ambulatory Visit: Payer: Self-pay

## 2019-08-01 DIAGNOSIS — Z955 Presence of coronary angioplasty implant and graft: Secondary | ICD-10-CM

## 2019-08-01 DIAGNOSIS — I10 Essential (primary) hypertension: Secondary | ICD-10-CM | POA: Diagnosis not present

## 2019-08-01 DIAGNOSIS — Z79899 Other long term (current) drug therapy: Secondary | ICD-10-CM | POA: Diagnosis not present

## 2019-08-01 DIAGNOSIS — K219 Gastro-esophageal reflux disease without esophagitis: Secondary | ICD-10-CM | POA: Diagnosis not present

## 2019-08-01 DIAGNOSIS — E785 Hyperlipidemia, unspecified: Secondary | ICD-10-CM | POA: Diagnosis not present

## 2019-08-01 DIAGNOSIS — Z7901 Long term (current) use of anticoagulants: Secondary | ICD-10-CM | POA: Diagnosis not present

## 2019-08-03 ENCOUNTER — Encounter (HOSPITAL_COMMUNITY): Payer: Medicare Other

## 2019-08-03 ENCOUNTER — Telehealth (HOSPITAL_COMMUNITY): Payer: Self-pay | Admitting: Family Medicine

## 2019-08-08 ENCOUNTER — Encounter (HOSPITAL_COMMUNITY)
Admission: RE | Admit: 2019-08-08 | Discharge: 2019-08-08 | Disposition: A | Discharge status: Home / Self Care | Payer: Medicare Other | Source: Ambulatory Visit | Attending: Interventional Cardiology | Admitting: Interventional Cardiology

## 2019-08-08 ENCOUNTER — Other Ambulatory Visit: Payer: Self-pay

## 2019-08-08 DIAGNOSIS — K219 Gastro-esophageal reflux disease without esophagitis: Secondary | ICD-10-CM | POA: Insufficient documentation

## 2019-08-08 DIAGNOSIS — Z7901 Long term (current) use of anticoagulants: Secondary | ICD-10-CM | POA: Diagnosis not present

## 2019-08-08 DIAGNOSIS — Z79899 Other long term (current) drug therapy: Secondary | ICD-10-CM | POA: Insufficient documentation

## 2019-08-08 DIAGNOSIS — I10 Essential (primary) hypertension: Secondary | ICD-10-CM | POA: Insufficient documentation

## 2019-08-08 DIAGNOSIS — E785 Hyperlipidemia, unspecified: Secondary | ICD-10-CM | POA: Insufficient documentation

## 2019-08-08 DIAGNOSIS — Z955 Presence of coronary angioplasty implant and graft: Secondary | ICD-10-CM | POA: Insufficient documentation

## 2019-08-08 NOTE — Progress Notes (Signed)
Cardiac Individual Treatment Plan  Patient Details  Name: Sheila Gardner MRN: [id number] Date of Birth: Jun 26, 1935 Referring Provider:     CARDIAC REHAB PHASE II ORIENTATION from 07/03/2019 in [place]  Referring Provider  Belva Crome.      Initial Encounter Date:    CARDIAC REHAB PHASE II ORIENTATION from 07/03/2019 in [facility]  Date  07/03/19      Visit Diagnosis: Status post coronary artery stent placement  Patient's Home Medications on Admission:  Current Outpatient Medications:  .  acetaminophen (TYLENOL) 650 MG CR tablet, Take 1,300 mg by mouth every 8 (eight) hours as needed for pain., Disp: , Rfl:  .  Ascorbic Acid (VITAMIN C) 1000 MG tablet, Take 1,000 mg by mouth daily. , Disp: , Rfl:  .  BIOTIN PO, Take 10,000 Units by mouth daily with breakfast. , Disp: , Rfl:  .  Cholecalciferol (VITAMIN D-3) 1000 UNITS CAPS, Take 1,000 Units by mouth daily with breakfast. , Disp: , Rfl:  .  cyanocobalamin 1000 MCG tablet, Take 1,000 mcg by mouth daily. , Disp: , Rfl:  .  dofetilide (TIKOSYN) 250 MCG capsule, Take 1 capsule (250 mcg total) by mouth 2 (two) times daily., Disp: 60 capsule, Rfl: 11 .  ELIQUIS 5 MG TABS tablet, Take 1 tablet (5 mg total) by mouth 2 (two) times daily., Disp: 60 tablet, Rfl: 5 .  esomeprazole (NEXIUM) 20 MG capsule, Take 20 mg by mouth daily at 12 noon., Disp: , Rfl:  .  ferrous sulfate 325 (65 FE) MG tablet, Take 325 mg by mouth daily with breakfast., Disp: , Rfl:  .  furosemide (LASIX) 40 MG tablet, Take 1 tablet (40 mg total) by mouth daily., Disp: 90 tablet, Rfl: 3 .  LORazepam (ATIVAN) 1 MG tablet, Take 1 mg by mouth at bedtime. , Disp: , Rfl:  .  losartan (COZAAR) 100 MG tablet, Take 100 mg by mouth daily. , Disp: , Rfl:  .  magnesium oxide (MAG-OX) 400 (241.3 Mg) MG tablet, Take 1 tablet (400 mg total) by mouth daily., Disp: 30 tablet, Rfl: 6 .  potassium chloride (KLOR-CON) 10 MEQ  tablet, Take 4 tablets (40 mEq total) by mouth daily., Disp: 120 tablet, Rfl: 6 .  prednisoLONE acetate (PRED FORTE) 1 % ophthalmic suspension, Place 1 drop into both eyes daily as needed (Burning). , Disp: , Rfl:  .  pyridOXINE (VITAMIN B-6) 100 MG tablet, Take 100 mg by mouth daily., Disp: , Rfl:  .  rosuvastatin (CRESTOR) 40 MG tablet, Take 1 tablet (40 mg total) by mouth daily at 6 PM., Disp: 30 tablet, Rfl: 6 .  ticagrelor (BRILINTA) 90 MG TABS tablet, Take 1 tablet (90 mg total) by mouth 2 (two) times daily., Disp: 60 tablet, Rfl: 11 .  vitamin E 400 UNIT capsule, Take 400 Units by mouth daily., Disp: , Rfl:   Past Medical History: Past Medical History:  Diagnosis Date  . Arthritis   . Atrial fibrillation ([place])   . Breast cancer ([place])   . Cancer ([place])    Breast- rt  . Cough with sputum 2016  . Elevated liver function tests 05/09/2013  . GERD (gastroesophageal reflux disease)    COSTOCHONDRITIS  . Heart murmur   . Hip pain 05/08/2014  . Hyperlipidemia   . Hypertension   . Lipoma    back of neck  . PONV (postoperative nausea and vomiting)    only after rt hip  surgery  . Primary osteoarthritis of left knee 11/04/2015  . Primary osteoarthritis of right hip 07/30/2014    Tobacco Use: Social History   Tobacco Use  Smoking Status Never Smoker  Smokeless Tobacco Never Used    Labs: Recent Review Flowsheet Data    There is no flowsheet data to display.      Capillary Blood Glucose: No results found for: GLUCAP   Exercise Target Goals: Exercise Program Goal: Individual exercise prescription set using results from initial 6 min walk test and THRR while considering  patient's activity barriers and safety.   Exercise Prescription Goal: Initial exercise prescription builds to 30-45 minutes a day of aerobic activity, 2-3 days per week.  Home exercise guidelines will be given to patient during program as part of exercise prescription that the participant will  acknowledge.  Activity Barriers & Risk Stratification: Activity Barriers & Cardiac Risk Stratification - 07/03/19 1515      Activity Barriers & Cardiac Risk Stratification   Activity Barriers  Joint Problems;Left Knee Replacement;Shortness of Breath;Arthritis    Cardiac Risk Stratification  High       6 Minute Walk: 6 Minute Walk    Row Name 07/03/19 1435         6 Minute Walk   Distance  1407 feet     Walk Time  6 minutes     # of Rest Breaks  0     MPH  2.66     METS  2.2     RPE  11     Perceived Dyspnea   0     VO2 Peak  7.73     Symptoms  Yes (comment)     Comments  Left knee pain # 5 on 0-10  scale     Resting HR  49 bpm     Resting BP  152/58     Resting Oxygen Saturation   97 %     Exercise Oxygen Saturation  during 6 min walk  98 %     Max Ex. HR  79 bpm     Max Ex. BP  160/78     2 Minute Post BP  132/64        Oxygen Initial Assessment:   Oxygen Re-Evaluation:   Oxygen Discharge (Final Oxygen Re-Evaluation):   Initial Exercise Prescription: Initial Exercise Prescription - 07/03/19 1500      Date of Initial Exercise RX and Referring Provider   Date  07/03/19    Referring Provider  Belva Crome.    Expected Discharge Date  08/31/19      Recumbant Bike   Level  1    RPM  60    Watts  45    Minutes  15    METs  2      NuStep   Level  2    SPM  60    Minutes  15    METs  2      Prescription Details   Frequency (times per week)  3    Duration  Progress to 30 minutes of continuous aerobic without signs/symptoms of physical distress      Intensity   THRR 40-80% of Max Heartrate  55-110    Ratings of Perceived Exertion  11-13    Perceived Dyspnea  0-4      Progression   Progression  Continue progressive overload as per policy without signs/symptoms or physical distress.      Resistance Training  Training Prescription  Yes    Weight  2    Reps  10-15       Perform Capillary Blood Glucose checks as needed.  Exercise  Prescription Changes:  Exercise Prescription Changes    Row Name 07/09/19 1354 07/23/19 1346 08/01/19 1400         Response to Exercise   Blood Pressure (Admit)  124/78  114/64  110/52     Blood Pressure (Exercise)  168/82  120/60  118/60     Blood Pressure (Exit)  130/68  98/58  100/58     Heart Rate (Admit)  56 bpm  60 bpm  60 bpm     Heart Rate (Exercise)  81 bpm  95 bpm  74 bpm     Heart Rate (Exit)  57 bpm  68 bpm  60 bpm     Rating of Perceived Exertion (Exercise)  12  11  10      Symptoms  Patient c/o left knee pain.  Patient c/o left knee pain.  --     Comments  Off to a good start with exercise with some left knee pain.  --  --     Duration  Continue with 30 min of aerobic exercise without signs/symptoms of physical distress.  Continue with 30 min of aerobic exercise without signs/symptoms of physical distress.  Continue with 30 min of aerobic exercise without signs/symptoms of physical distress.     Intensity  THRR unchanged  THRR unchanged  THRR unchanged       Progression   Progression  Continue to progress workloads to maintain intensity without signs/symptoms of physical distress.  Continue to progress workloads to maintain intensity without signs/symptoms of physical distress.  Continue to progress workloads to maintain intensity without signs/symptoms of physical distress.     Average METs  2.7  2.6  3       Resistance Training   Training Prescription  Yes  Yes  No     Weight  2  2  --     Reps  10-15  10-15  --     Time  10 Minutes  10 Minutes  --       Interval Training   Interval Training  No  No  No       Recumbant Bike   Level  1  3  2      Watts  18  --  26     Minutes  15  15  15      METs  2.7  2.3  3       NuStep   Level  2  3  2      SPM  92  92  --     Minutes  15  15  15      METs  2.6  2.8  -- Missed average on NuStep       Home Exercise Plan   Plans to continue exercise at  --  Home (comment) Walking  Home (comment) Walking     Frequency  --   Add 2 additional days to program exercise sessions.  Add 2 additional days to program exercise sessions.     Initial Home Exercises Provided  --  07/20/19  07/20/19        Exercise Comments:  Exercise Comments    Row Name 07/09/19 1446 07/11/19 1400 07/20/19 1400 07/23/19 1400 08/08/19 1350   Exercise Comments  Patient tolerated first session of exercise well with  some left knee pain, which is chronic for her.  Reviewed METs and goals with patient.  Reviewed home exercise precription. Pt is currently not engaged in any home exercise. Home program of walking encouraged. Provided copy of prescription to patient.  Reviewed METs with patient.  Reviewed METs and goals with patient. Patient c/o shortness of breath with exercise today. Oxygen saturation remained in the mid to high 90s during exercise.      Exercise Goals and Review:  Exercise Goals    Row Name 07/03/19 1405             Exercise Goals   Increase Physical Activity  Yes       Intervention  Provide advice, education, support and counseling about physical activity/exercise needs.;Develop an individualized exercise prescription for aerobic and resistive training based on initial evaluation findings, risk stratification, comorbidities and participant's personal goals.       Expected Outcomes  Short Term: Attend rehab on a regular basis to increase amount of physical activity.;Long Term: Add in home exercise to make exercise part of routine and to increase amount of physical activity.;Long Term: Exercising regularly at least 3-5 days a week.       Increase Strength and Stamina  Yes       Intervention  Provide advice, education, support and counseling about physical activity/exercise needs.;Develop an individualized exercise prescription for aerobic and resistive training based on initial evaluation findings, risk stratification, comorbidities and participant's personal goals.       Expected Outcomes  Short Term: Increase workloads from  initial exercise prescription for resistance, speed, and METs.;Short Term: Perform resistance training exercises routinely during rehab and add in resistance training at home;Long Term: Improve cardiorespiratory fitness, muscular endurance and strength as measured by increased METs and functional capacity (6MWT)       Able to understand and use rate of perceived exertion (RPE) scale  Yes       Intervention  Provide education and explanation on how to use RPE scale       Expected Outcomes  Short Term: Able to use RPE daily in rehab to express subjective intensity level;Long Term:  Able to use RPE to guide intensity level when exercising independently       Knowledge and understanding of Target Heart Rate Range (THRR)  Yes       Intervention  Provide education and explanation of THRR including how the numbers were predicted and where they are located for reference       Expected Outcomes  Short Term: Able to state/look up THRR;Long Term: Able to use THRR to govern intensity when exercising independently;Short Term: Able to use daily as guideline for intensity in rehab       Able to check pulse independently  Yes       Intervention  Provide education and demonstration on how to check pulse in carotid and radial arteries.;Review the importance of being able to check your own pulse for safety during independent exercise       Expected Outcomes  Short Term: Able to explain why pulse checking is important during independent exercise       Understanding of Exercise Prescription  Yes       Intervention  Provide education, explanation, and written materials on patient's individual exercise prescription       Expected Outcomes  Short Term: Able to explain program exercise prescription;Long Term: Able to explain home exercise prescription to exercise independently  Exercise Goals Re-Evaluation : Exercise Goals Re-Evaluation    Row Name 07/09/19 1446 07/11/19 1400 07/20/19 1400 08/08/19 1350        Exercise Goal Re-Evaluation   Exercise Goals Review  Increase Physical Activity;Able to understand and use rate of perceived exertion (RPE) scale  Increase Physical Activity;Able to understand and use rate of perceived exertion (RPE) scale;Increase Strength and Stamina  Increase Physical Activity;Increase Strength and Stamina;Able to understand and use rate of perceived exertion (RPE) scale;Knowledge and understanding of Target Heart Rate Range (THRR);Able to check pulse independently;Understanding of Exercise Prescription  Increase Physical Activity;Increase Strength and Stamina;Able to understand and use rate of perceived exertion (RPE) scale;Knowledge and understanding of Target Heart Rate Range (THRR);Able to check pulse independently;Understanding of Exercise Prescription    Comments  Patient able to understand and use RPE scale appropriately.  Patient is walking 30 minutes daily in addition to exercise at cardiac rehab. Patient's goals are to make her heart and body stronger and to lose weight.  Reviewed goals of home exercise program and encouraged patient to begin walking 2-4 days/week for 30-45 minutes which would give her 5-7 days/week of phycial activity. Encouraged patient to abide by the componets of the exercise prescription including safety. See notes.  Patient feels that her stamina is better, and she's been able to clean her house only having to rest every 2 hours. Patient also feels her shortness of breath is better. Patient not consistent with walking at home, but is gradually progressing with her exercise at cardiac rehab. Discussed increasing workloads at cardiac rehab, and patient is amenable to this.    Expected Outcomes  Increase workloads as tolerated to help improve cardiorespiratory fitness.  Patient will continue daily exercise routine to help achieve personal health and fitness goals.  Pt will begin to walk at home on days off from Phase 2 C/R and abide by the exercise prescription  components and safety considerations.  Continue to progress workloads as tolerated to help improve cardiorespiratory fitness.       Discharge Exercise Prescription (Final Exercise Prescription Changes): Exercise Prescription Changes - 08/01/19 1400      Response to Exercise   Blood Pressure (Admit)  110/52    Blood Pressure (Exercise)  118/60    Blood Pressure (Exit)  100/58    Heart Rate (Admit)  60 bpm    Heart Rate (Exercise)  74 bpm    Heart Rate (Exit)  60 bpm    Rating of Perceived Exertion (Exercise)  10    Duration  Continue with 30 min of aerobic exercise without signs/symptoms of physical distress.    Intensity  THRR unchanged      Progression   Progression  Continue to progress workloads to maintain intensity without signs/symptoms of physical distress.    Average METs  3      Resistance Training   Training Prescription  No      Interval Training   Interval Training  No      Recumbant Bike   Level  2    Watts  26    Minutes  15    METs  3      NuStep   Level  2    Minutes  15    METs  --   Missed average on NuStep     Home Exercise Plan   Plans to continue exercise at  Home (comment)   Walking   Frequency  Add 2 additional days to program exercise sessions.  Initial Home Exercises Provided  07/20/19       Nutrition:  Target Goals: Understanding of nutrition guidelines, daily intake of sodium 1500mg , cholesterol 200mg , calories 30% from fat and 7% or less from saturated fats, daily to have 5 or more servings of fruits and vegetables.  Biometrics: Pre Biometrics - 07/03/19 1419      Pre Biometrics   Waist Circumference  41.75 inches    Hip Circumference  45 inches    Waist to Hip Ratio  0.93 %    Triceps Skinfold  35 mm    % Body Fat  44.9 %    Grip Strength  27 kg    Flexibility  10.25 in    Single Leg Stand  5.31 seconds        Nutrition Therapy Plan and Nutrition Goals: Nutrition Therapy & Goals - 07/18/19 1439      Nutrition  Therapy   Diet  Heart Healthy      Personal Nutrition Goals   Nutrition Goal  Pt to build a healthy plate including vegetables, fruits, whole grains, and low-fat dairy products in a heart healthy meal plan.    Personal Goal #2  Pt to create a meal plan and grocery list with heart healthy foods      Intervention Plan   Intervention  Nutrition handout(s) given to patient.;Prescribe, educate and counsel regarding individualized specific dietary modifications aiming towards targeted core components such as weight, hypertension, lipid management, diabetes, heart failure and other comorbidities.    Expected Outcomes  Short Term Goal: A plan has been developed with personal nutrition goals set during dietitian appointment.;Long Term Goal: Adherence to prescribed nutrition plan.       Nutrition Assessments: Nutrition Assessments - 07/18/19 1440      MEDFICTS Scores   Pre Score  36       Nutrition Goals Re-Evaluation: Nutrition Goals Re-Evaluation    [place] Name 07/18/19 1440             Goals   Current Weight  179 lb (81.2 kg)       Nutrition Goal  Pt to build a healthy plate including vegetables, fruits, whole grains, and low-fat dairy products in a heart healthy meal plan.         Personal Goal #2 Re-Evaluation   Personal Goal #2  Pt to create a meal plan and grocery list with heart healthy foods          Nutrition Goals Re-Evaluation: Nutrition Goals Re-Evaluation    [place] Name 07/18/19 1440             Goals   Current Weight  179 lb (81.2 kg)       Nutrition Goal  Pt to build a healthy plate including vegetables, fruits, whole grains, and low-fat dairy products in a heart healthy meal plan.         Personal Goal #2 Re-Evaluation   Personal Goal #2  Pt to create a meal plan and grocery list with heart healthy foods          Nutrition Goals Discharge (Final Nutrition Goals Re-Evaluation): Nutrition Goals Re-Evaluation - 07/18/19 1440      Goals   Current Weight  179 lb  (81.2 kg)    Nutrition Goal  Pt to build a healthy plate including vegetables, fruits, whole grains, and low-fat dairy products in a heart healthy meal plan.      Personal Goal #2 Re-Evaluation   Personal Goal #2  Pt to create a meal plan and grocery list with heart healthy foods       Psychosocial: Target Goals: Acknowledge presence or absence of significant depression and/or stress, maximize coping skills, provide positive support system. Participant is able to verbalize types and ability to use techniques and skills needed for reducing stress and depression.  Initial Review & Psychosocial Screening: Initial Psych Review & Screening - 07/03/19 1617      Initial Review   Current issues with  None Identified      Family Dynamics   Good Support System?  Yes    Comments  Ms. Dorminey has a positive attitude and outlook. She describes a positive support system of family and friends. No interventions needed at this time.      Barriers   Psychosocial barriers to participate in program  There are no identifiable barriers or psychosocial needs.      Screening Interventions   Interventions  Encouraged to exercise       Quality of Life Scores: Quality of Life - 07/11/19 1550      Quality of Life   Select  Quality of Life      Quality of Life Scores   Health/Function Pre  21.69 %    Socioeconomic Pre  30 %    Psych/Spiritual Pre  30 %    Family Pre  25.5 %    GLOBAL Pre  25.66 %      Scores of 19 and below usually indicate a poorer quality of life in these areas.  A difference of  2-3 points is a clinically meaningful difference.  A difference of 2-3 points in the total score of the Quality of Life Index has been associated with significant improvement in overall quality of life, self-image, physical symptoms, and general health in studies assessing change in quality of life.  PHQ-9: Recent Review Flowsheet Data    Depression screen [facility] 2/9 07/03/2019   Decreased Interest 0    Down, Depressed, Hopeless 0   PHQ - 2 Score 0     Interpretation of Total Score  Total Score Depression Severity:  1-4 = Minimal depression, 5-9 = Mild depression, 10-14 = Moderate depression, 15-19 = Moderately severe depression, 20-27 = Severe depression   Psychosocial Evaluation and Intervention: Psychosocial Evaluation - 07/09/19 1555      Psychosocial Evaluation & Interventions   Interventions  Encouraged to exercise with the program and follow exercise prescription    Comments  Ms. Greaney continues to have a positive outlook and attitude. She acknolodges a strong support system of family and friends. She enjoys cooking, entertaining, and spending time with her grandchildren and utilizes this joy to positively deal with any stress that may arise. No interventions needed at this time.    Expected Outcomes  Patient will continue to have a positive outlook and attitude. Will continue to remain free from psychosocial barriers to exercie and participation in CR.    Continue Psychosocial Services   No Follow up required       Psychosocial Re-Evaluation: Psychosocial Re-Evaluation    [place] Name 08/08/19 1630             Psychosocial Re-Evaluation   Comments  Ms. Grice continues to have a positive outlook and attitude. She acknolodges a strong support system of family and friends. She enjoys cooking, entertaining, and spending time with her grandchildren and utilizes this joy to positively deal with any stress that may arise. No interventions needed at this time.  Expected Outcomes  Patient will continue to have a positive outlook and attitude. Will continue to remain free from psychosocial barriers to exercie and participation in CR.       Continue Psychosocial Services   No Follow up required          Psychosocial Discharge (Final Psychosocial Re-Evaluation): Psychosocial Re-Evaluation - 08/08/19 1630      Psychosocial Re-Evaluation   Comments  Ms. Kaur continues to have  a positive outlook and attitude. She acknolodges a strong support system of family and friends. She enjoys cooking, entertaining, and spending time with her grandchildren and utilizes this joy to positively deal with any stress that may arise. No interventions needed at this time.    Expected Outcomes  Patient will continue to have a positive outlook and attitude. Will continue to remain free from psychosocial barriers to exercie and participation in CR.    Continue Psychosocial Services   No Follow up required       Vocational Rehabilitation: Provide vocational rehab assistance to qualifying candidates.   Vocational Rehab Evaluation & Intervention:   Education: Education Goals: Education classes will be provided on a weekly basis, covering required topics. Participant will state understanding/return demonstration of topics presented.  Learning Barriers/Preferences: Learning Barriers/Preferences - 07/03/19 1534      Learning Barriers/Preferences   Learning Barriers  None    Learning Preferences  Written Material       Education Topics: Count Your Pulse:  -Group instruction provided by verbal instruction, demonstration, patient participation and written materials to support subject.  Instructors address importance of being able to find your pulse and how to count your pulse when at home without a heart monitor.  Patients get hands on experience counting their pulse with staff help and individually.   Heart Attack, Angina, and Risk Factor Modification:  -Group instruction provided by verbal instruction, video, and written materials to support subject.  Instructors address signs and symptoms of angina and heart attacks.    Also discuss risk factors for heart disease and how to make changes to improve heart health risk factors.   Functional Fitness:  -Group instruction provided by verbal instruction, demonstration, patient participation, and written materials to support subject.   Instructors address safety measures for doing things around the house.  Discuss how to get up and down off the floor, how to pick things up properly, how to safely get out of a chair without assistance, and balance training.   Meditation and Mindfulness:  -Group instruction provided by verbal instruction, patient participation, and written materials to support subject.  Instructor addresses importance of mindfulness and meditation practice to help reduce stress and improve awareness.  Instructor also leads participants through a meditation exercise.    Stretching for Flexibility and Mobility:  -Group instruction provided by verbal instruction, patient participation, and written materials to support subject.  Instructors lead participants through series of stretches that are designed to increase flexibility thus improving mobility.  These stretches are additional exercise for major muscle groups that are typically performed during regular warm up and cool down.   Hands Only CPR:  -Group verbal, video, and participation provides a basic overview of AHA guidelines for community CPR. Role-play of emergencies allow participants the opportunity to practice calling for help and chest compression technique with discussion of AED use.   Hypertension: -Group verbal and written instruction that provides a basic overview of hypertension including the most recent diagnostic guidelines, risk factor reduction with self-care instructions and medication management.  Nutrition I class: Heart Healthy Eating:  -Group instruction provided by PowerPoint slides, verbal discussion, and written materials to support subject matter. The instructor gives an explanation and review of the Therapeutic Lifestyle Changes diet recommendations, which includes a discussion on lipid goals, dietary fat, sodium, fiber, plant stanol/sterol esters, sugar, and the components of a well-balanced, healthy diet.   Nutrition II class:  Lifestyle Skills:  -Group instruction provided by PowerPoint slides, verbal discussion, and written materials to support subject matter. The instructor gives an explanation and review of label reading, grocery shopping for heart health, heart healthy recipe modifications, and ways to make healthier choices when eating out.   Diabetes Question & Answer:  -Group instruction provided by PowerPoint slides, verbal discussion, and written materials to support subject matter. The instructor gives an explanation and review of diabetes co-morbidities, pre- and post-prandial blood glucose goals, pre-exercise blood glucose goals, signs, symptoms, and treatment of hypoglycemia and hyperglycemia, and foot care basics.   Diabetes Blitz:  -Group instruction provided by PowerPoint slides, verbal discussion, and written materials to support subject matter. The instructor gives an explanation and review of the physiology behind type 1 and type 2 diabetes, diabetes medications and rational behind using different medications, pre- and post-prandial blood glucose recommendations and Hemoglobin A1c goals, diabetes diet, and exercise including blood glucose guidelines for exercising safely.    Portion Distortion:  -Group instruction provided by PowerPoint slides, verbal discussion, written materials, and food models to support subject matter. The instructor gives an explanation of serving size versus portion size, changes in portions sizes over the last 20 years, and what consists of a serving from each food group.   Stress Management:  -Group instruction provided by verbal instruction, video, and written materials to support subject matter.  Instructors review role of stress in heart disease and how to cope with stress positively.     Exercising on Your Own:  -Group instruction provided by verbal instruction, power point, and written materials to support subject.  Instructors discuss benefits of exercise, components  of exercise, frequency and intensity of exercise, and end points for exercise.  Also discuss use of nitroglycerin and activating EMS.  Review options of places to exercise outside of rehab.  Review guidelines for sex with heart disease.   Cardiac Drugs I:  -Group instruction provided by verbal instruction and written materials to support subject.  Instructor reviews cardiac drug classes: antiplatelets, anticoagulants, beta blockers, and statins.  Instructor discusses reasons, side effects, and lifestyle considerations for each drug class.   Cardiac Drugs II:  -Group instruction provided by verbal instruction and written materials to support subject.  Instructor reviews cardiac drug classes: angiotensin converting enzyme inhibitors (ACE-I), angiotensin II receptor blockers (ARBs), nitrates, and calcium channel blockers.  Instructor discusses reasons, side effects, and lifestyle considerations for each drug class.   Anatomy and Physiology of the Circulatory System:  Group verbal and written instruction and models provide basic cardiac anatomy and physiology, with the coronary electrical and arterial systems. Review of: AMI, Angina, Valve disease, Heart Failure, Peripheral Artery Disease, Cardiac Arrhythmia, Pacemakers, and the ICD.   Other Education:  -Group or individual verbal, written, or video instructions that support the educational goals of the cardiac rehab program.   Holiday Eating Survival Tips:  -Group instruction provided by PowerPoint slides, verbal discussion, and written materials to support subject matter. The instructor gives patients tips, tricks, and techniques to help them not only survive but enjoy the holidays despite the onslaught of food that  accompanies the holidays.   Knowledge Questionnaire Score: Knowledge Questionnaire Score - 07/11/19 1550      Knowledge Questionnaire Score   Pre Score  16/24       Core Components/Risk Factors/Patient Goals at  Admission: Personal Goals and Risk Factors at Admission - 07/03/19 1536      Core Components/Risk Factors/Patient Goals on Admission    Weight Management  Yes    Intervention  Weight Management: Develop a combined nutrition and exercise program designed to reach desired caloric intake, while maintaining appropriate intake of nutrient and fiber, sodium and fats, and appropriate energy expenditure required for the weight goal.;Weight Management: Provide education and appropriate resources to help participant work on and attain dietary goals.;Weight Management/Obesity: Establish reasonable short term and long term weight goals.;Obesity: Provide education and appropriate resources to help participant work on and attain dietary goals.    Admit Weight  174 lb 12.8 oz (79.3 kg)    Expected Outcomes  Short Term: Continue to assess and modify interventions until short term weight is achieved;Long Term: Adherence to nutrition and physical activity/exercise program aimed toward attainment of established weight goal;Weight Loss: Understanding of general recommendations for a balanced deficit meal plan, which promotes 1-2 lb weight loss per week and includes a negative energy balance of [phone] kcal/d;Understanding recommendations for meals to include 15-35% energy as protein, 25-35% energy from fat, 35-60% energy from carbohydrates, less than 200mg  of dietary cholesterol, 20-35 gm of total fiber daily;Understanding of distribution of calorie intake throughout the day with the consumption of 4-5 meals/snacks    Hypertension  Yes    Intervention  Provide education on lifestyle modifcations including regular physical activity/exercise, weight management, moderate sodium restriction and increased consumption of fresh fruit, vegetables, and low fat dairy, alcohol moderation, and smoking cessation.;Monitor prescription use compliance.    Expected Outcomes  Short Term: Continued assessment and intervention until BP is <  140/43mm HG in hypertensive participants. < 130/61mm HG in hypertensive participants with diabetes, heart failure or chronic kidney disease.;Long Term: Maintenance of blood pressure at goal levels.    Lipids  Yes    Intervention  Provide education and support for participant on nutrition & aerobic/resistive exercise along with prescribed medications to achieve LDL 70mg , HDL >40mg .    Expected Outcomes  Short Term: Participant states understanding of desired cholesterol values and is compliant with medications prescribed. Participant is following exercise prescription and nutrition guidelines.;Long Term: Cholesterol controlled with medications as prescribed, with individualized exercise RX and with personalized nutrition plan. Value goals: LDL < 70mg , HDL > 40 mg.       Core Components/Risk Factors/Patient Goals Review:  Goals and Risk Factor Review    Row Name 07/09/19 1558 08/08/19 1630           Core Components/Risk Factors/Patient Goals Review   Personal Goals Review  Weight Management/Obesity;Lipids;Hypertension  Weight Management/Obesity;Lipids;Hypertension      Review  Ms. Wiltgen has multiple CAD risk factors. She is very eager to participate in CR for education and risk factor modification. Her goals are to feel better, have more energy, less shortness of breath, and to improve her balance.  Ms. Cardiff has multiple CAD risk factors. She continues to be eager to participate in CR for education and risk factor modification. Her goals are to feel better, have more energy, less shortness of breath, and to improve her balance. She is on track to meet her goals upon discharge from the program.      Expected Outcomes  Patient will continue to participate in CR for education and risk factor modification. She will meet her personal goals at the end of program.  Patient will continue to participate in CR for education and risk factor modification. She will meet her personal goals at the end of  program.         Core Components/Risk Factors/Patient Goals at Discharge (Final Review):  Goals and Risk Factor Review - 08/08/19 1630      Core Components/Risk Factors/Patient Goals Review   Personal Goals Review  Weight Management/Obesity;Lipids;Hypertension    Review  Ms. Linquist has multiple CAD risk factors. She continues to be eager to participate in CR for education and risk factor modification. Her goals are to feel better, have more energy, less shortness of breath, and to improve her balance. She is on track to meet her goals upon discharge from the program.    Expected Outcomes  Patient will continue to participate in CR for education and risk factor modification. She will meet her personal goals at the end of program.       ITP Comments: ITP Comments    Row Name 07/03/19 1417 07/09/19 1553 08/08/19 1614       ITP Comments  Dr. Fransico Him, Medical Director Cardiac Rehab [facility]  30 day ITP review: Ms. Mega completed her first cardiac rehab exercise session today and tolerated very well. VSS. Patient denied complaints. Able to complete initial exercise prescription without modifications. Worked to an RPE of 11-12.  30 day ITP review: Ms. Mas has completed 10 CR exercise sessions since admission. She had a brief absence last week secondary to GI upset. She feels much better today and able to work to an RPE of 12. She does complain of SOB today, possible from weather change. Her weight is stable, no lower ext edema, lungs clear, and O2 saturations 95%. Discussed taking lasix vs caffine with Brillinta. She does admit to feeling better and an increase in her stamina and strength which is one of her personal goals for participating in CR. She is feeling well enough to do her own housework and has learned to pace herself, resting for a few minutes when needed. This makes her feel accomplished.        Comments: see ITP comments

## 2019-08-09 DIAGNOSIS — M76892 Other specified enthesopathies of left lower limb, excluding foot: Secondary | ICD-10-CM | POA: Diagnosis not present

## 2019-08-09 DIAGNOSIS — Z96652 Presence of left artificial knee joint: Secondary | ICD-10-CM | POA: Diagnosis not present

## 2019-08-10 ENCOUNTER — Encounter (HOSPITAL_COMMUNITY): Payer: Medicare Other

## 2019-08-10 ENCOUNTER — Telehealth (HOSPITAL_COMMUNITY): Payer: Self-pay | Admitting: Family Medicine

## 2019-08-13 ENCOUNTER — Encounter (HOSPITAL_COMMUNITY): Payer: Medicare Other

## 2019-08-15 ENCOUNTER — Encounter (HOSPITAL_COMMUNITY): Payer: Medicare Other

## 2019-08-15 ENCOUNTER — Telehealth (HOSPITAL_COMMUNITY): Payer: Self-pay | Admitting: Family Medicine

## 2019-08-17 ENCOUNTER — Encounter (HOSPITAL_COMMUNITY): Payer: Medicare Other

## 2019-08-17 DIAGNOSIS — Z23 Encounter for immunization: Secondary | ICD-10-CM | POA: Diagnosis not present

## 2019-08-20 ENCOUNTER — Telehealth (HOSPITAL_COMMUNITY): Payer: Self-pay

## 2019-08-20 ENCOUNTER — Telehealth (HOSPITAL_COMMUNITY): Payer: Self-pay | Admitting: Family Medicine

## 2019-08-20 ENCOUNTER — Encounter (HOSPITAL_COMMUNITY): Payer: Self-pay

## 2019-08-20 ENCOUNTER — Encounter (HOSPITAL_COMMUNITY): Payer: Medicare Other

## 2019-08-20 ENCOUNTER — Telehealth: Payer: Self-pay | Admitting: Internal Medicine

## 2019-08-20 NOTE — Telephone Encounter (Signed)
Cardiac Rehab Note:  Successful telephone encounter to Sheila Gardner per her request, to follow up on her extended absence from CR exercise sessions. Per Ms. Seevers, she is most concerned with her right hip "popping loud" when she walks. States "it feels like my hip is coming out of joint". It is noted that Ms. Birnbaum had a total hip replacement 2016. She is strongly encouraged to contact her orthopedic MD ASAP to request follow up appointment. She is agreeable to early discharge from CR exercise sessions secondary to hip complaint. She was originally scheduled to graduate from cardiac rehab 08/31/19. Patient verbalizes understanding.  Orien Mayhall E. Rollene Rotunda RN, BSN [place]. [facility]  Cardiac and Pulmonary Rehabilitation Phone: [phone] Fax: [phone]

## 2019-08-20 NOTE — Progress Notes (Signed)
Discharge Progress Report  Patient Details  Name: Sheila Gardner MRN: [id number] Date of Birth: April 19, 1935 Referring Provider:     [place] from 07/03/2019 in [place]  Referring Provider Belva Crome.       Number of Visits: 10  Reason for Discharge:  Early Exit:  medical instability secondary c/o right hip "popping" with ambulation and "feeling like it is popping out of joint"  Smoking History:  Social History   Tobacco Use  Smoking Status Never Smoker  Smokeless Tobacco Never Used    Diagnosis:  No diagnosis found.  ADL [facility]:   Initial Exercise Prescription:  Initial Exercise Prescription - 07/03/19 1500      Date of Initial Exercise RX and Referring Provider   Date 07/03/19    Referring Provider Belva Crome.    Expected Discharge Date 08/31/19      Recumbant Bike   Level 1    RPM 60    Watts 45    Minutes 15    METs 2      NuStep   Level 2    SPM 60    Minutes 15    METs 2      Prescription Details   Frequency (times per week) 3    Duration Progress to 30 minutes of continuous aerobic without signs/symptoms of physical distress      Intensity   THRR 40-80% of Max Heartrate 55-110    Ratings of Perceived Exertion 11-13    Perceived Dyspnea 0-4      Progression   Progression Continue progressive overload as per policy without signs/symptoms or physical distress.      Resistance Training   Training Prescription Yes    Weight 2    Reps 10-15           Discharge Exercise Prescription (Final Exercise Prescription Changes):  Exercise Prescription Changes - 08/01/19 1400      Response to Exercise   Blood Pressure (Admit) 110/52    Blood Pressure (Exercise) 118/60    Blood Pressure (Exit) 100/58    Heart Rate (Admit) 60 bpm    Heart Rate (Exercise) 74 bpm    Heart Rate (Exit) 60 bpm    Rating of Perceived Exertion (Exercise) 10    Duration Continue with 30 min of aerobic  exercise without signs/symptoms of physical distress.    Intensity THRR unchanged      Progression   Progression Continue to progress workloads to maintain intensity without signs/symptoms of physical distress.    Average METs 3      Resistance Training   Training Prescription No      Interval Training   Interval Training No      Recumbant Bike   Level 2    Watts 26    Minutes 15    METs 3      NuStep   Level 2    Minutes 15    METs --   Missed average on NuStep     Home Exercise Plan   Plans to continue exercise at Home (comment)   Walking   Frequency Add 2 additional days to program exercise sessions.    Initial Home Exercises Provided 07/20/19           Functional Capacity:  6 Minute Walk    Row Name 07/03/19 1435         6 Minute Walk   Distance 1407 feet  Walk Time 6 minutes     # of Rest Breaks 0     MPH 2.66     METS 2.2     RPE 11     Perceived Dyspnea  0     VO2 Peak 7.73     Symptoms Yes (comment)     Comments Left knee pain # 5 on 0-10  scale     Resting HR 49 bpm     Resting BP 152/58     Resting Oxygen Saturation  97 %     Exercise Oxygen Saturation  during 6 min walk 98 %     Max Ex. HR 79 bpm     Max Ex. BP 160/78     2 Minute Post BP 132/64            Psychological, QOL, Others - Outcomes: PHQ 2/9: Depression screen PHQ 2/9 07/03/2019  Decreased Interest 0  Down, Depressed, Hopeless 0  PHQ - 2 Score 0    Quality of Life:  Quality of Life - 07/11/19 1550      Quality of Life   Select Quality of Life      Quality of Life Scores   Health/Function Pre 21.69 %    Socioeconomic Pre 30 %    Psych/Spiritual Pre 30 %    Family Pre 25.5 %    GLOBAL Pre 25.66 %           Personal Goals: Goals established at orientation with interventions provided to work toward goal.  Personal Goals and Risk Factors at Admission - 07/03/19 1536      Core Components/Risk Factors/Patient Goals on Admission    Weight Management Yes     Intervention Weight Management: Develop a combined nutrition and exercise program designed to reach desired caloric intake, while maintaining appropriate intake of nutrient and fiber, sodium and fats, and appropriate energy expenditure required for the weight goal.;Weight Management: Provide education and appropriate resources to help participant work on and attain dietary goals.;Weight Management/Obesity: Establish reasonable short term and long term weight goals.;Obesity: Provide education and appropriate resources to help participant work on and attain dietary goals.    Admit Weight 174 lb 12.8 oz (79.3 kg)    Expected Outcomes Short Term: Continue to assess and modify interventions until short term weight is achieved;Long Term: Adherence to nutrition and physical activity/exercise program aimed toward attainment of established weight goal;Weight Loss: Understanding of general recommendations for a balanced deficit meal plan, which promotes 1-2 lb weight loss per week and includes a negative energy balance of [phone] kcal/d;Understanding recommendations for meals to include 15-35% energy as protein, 25-35% energy from fat, 35-60% energy from carbohydrates, less than 200mg  of dietary cholesterol, 20-35 gm of total fiber daily;Understanding of distribution of calorie intake throughout the day with the consumption of 4-5 meals/snacks    Hypertension Yes    Intervention Provide education on lifestyle modifcations including regular physical activity/exercise, weight management, moderate sodium restriction and increased consumption of fresh fruit, vegetables, and low fat dairy, alcohol moderation, and smoking cessation.;Monitor prescription use compliance.    Expected Outcomes Short Term: Continued assessment and intervention until BP is < 140/35mm HG in hypertensive participants. < 130/55mm HG in hypertensive participants with diabetes, heart failure or chronic kidney disease.;Long Term: Maintenance of blood  pressure at goal levels.    Lipids Yes    Intervention Provide education and support for participant on nutrition & aerobic/resistive exercise along with prescribed medications to achieve LDL 70mg , HDL >  40mg .    Expected Outcomes Short Term: Participant states understanding of desired cholesterol values and is compliant with medications prescribed. Participant is following exercise prescription and nutrition guidelines.;Long Term: Cholesterol controlled with medications as prescribed, with individualized exercise RX and with personalized nutrition plan. Value goals: LDL < 70mg , HDL > 40 mg.            Personal Goals Discharge:  Goals and Risk Factor Review    Row Name 07/09/19 1558 08/08/19 1630           Core Components/Risk Factors/Patient Goals Review   Personal Goals Review Weight Management/Obesity;Lipids;Hypertension Weight Management/Obesity;Lipids;Hypertension      Review Ms. Droessler has multiple CAD risk factors. She is very eager to participate in CR for education and risk factor modification. Her goals are to feel better, have more energy, less shortness of breath, and to improve her balance. Ms. Ryall has multiple CAD risk factors. She continues to be eager to participate in CR for education and risk factor modification. Her goals are to feel better, have more energy, less shortness of breath, and to improve her balance. She is on track to meet her goals upon discharge from the program.      Expected Outcomes Patient will continue to participate in CR for education and risk factor modification. She will meet her personal goals at the end of program. Patient will continue to participate in CR for education and risk factor modification. She will meet her personal goals at the end of program.             Exercise Goals and Review:  Exercise Goals    Row Name 07/03/19 1405             Exercise Goals   Increase Physical Activity Yes       Intervention Provide advice,  education, support and counseling about physical activity/exercise needs.;Develop an individualized exercise prescription for aerobic and resistive training based on initial evaluation findings, risk stratification, comorbidities and participant's personal goals.       Expected Outcomes Short Term: Attend rehab on a regular basis to increase amount of physical activity.;Long Term: Add in home exercise to make exercise part of routine and to increase amount of physical activity.;Long Term: Exercising regularly at least 3-5 days a week.       Increase Strength and Stamina Yes       Intervention Provide advice, education, support and counseling about physical activity/exercise needs.;Develop an individualized exercise prescription for aerobic and resistive training based on initial evaluation findings, risk stratification, comorbidities and participant's personal goals.       Expected Outcomes Short Term: Increase workloads from initial exercise prescription for resistance, speed, and METs.;Short Term: Perform resistance training exercises routinely during rehab and add in resistance training at home;Long Term: Improve cardiorespiratory fitness, muscular endurance and strength as measured by increased METs and functional capacity (6MWT)       Able to understand and use rate of perceived exertion (RPE) scale Yes       Intervention Provide education and explanation on how to use RPE scale       Expected Outcomes Short Term: Able to use RPE daily in rehab to express subjective intensity level;Long Term:  Able to use RPE to guide intensity level when exercising independently       Knowledge and understanding of Target Heart Rate Range (THRR) Yes       Intervention Provide education and explanation of THRR including how the numbers were  predicted and where they are located for reference       Expected Outcomes Short Term: Able to state/look up THRR;Long Term: Able to use THRR to govern intensity when exercising  independently;Short Term: Able to use daily as guideline for intensity in rehab       Able to check pulse independently Yes       Intervention Provide education and demonstration on how to check pulse in carotid and radial arteries.;Review the importance of being able to check your own pulse for safety during independent exercise       Expected Outcomes Short Term: Able to explain why pulse checking is important during independent exercise       Understanding of Exercise Prescription Yes       Intervention Provide education, explanation, and written materials on patient's individual exercise prescription       Expected Outcomes Short Term: Able to explain program exercise prescription;Long Term: Able to explain home exercise prescription to exercise independently              Exercise Goals Re-Evaluation:  Exercise Goals Re-Evaluation    Row Name 07/09/19 1446 07/11/19 1400 07/20/19 1400 08/08/19 1350       Exercise Goal Re-Evaluation   Exercise Goals Review Increase Physical Activity;Able to understand and use rate of perceived exertion (RPE) scale Increase Physical Activity;Able to understand and use rate of perceived exertion (RPE) scale;Increase Strength and Stamina Increase Physical Activity;Increase Strength and Stamina;Able to understand and use rate of perceived exertion (RPE) scale;Knowledge and understanding of Target Heart Rate Range (THRR);Able to check pulse independently;Understanding of Exercise Prescription Increase Physical Activity;Increase Strength and Stamina;Able to understand and use rate of perceived exertion (RPE) scale;Knowledge and understanding of Target Heart Rate Range (THRR);Able to check pulse independently;Understanding of Exercise Prescription    Comments Patient able to understand and use RPE scale appropriately. Patient is walking 30 minutes daily in addition to exercise at cardiac rehab. Patient's goals are to make her heart and body stronger and to lose weight.  Reviewed goals of home exercise program and encouraged patient to begin walking 2-4 days/week for 30-45 minutes which would give her 5-7 days/week of phycial activity. Encouraged patient to abide by the componets of the exercise prescription including safety. See notes. Patient feels that her stamina is better, and she's been able to clean her house only having to rest every 2 hours. Patient also feels her shortness of breath is better. Patient not consistent with walking at home, but is gradually progressing with her exercise at cardiac rehab. Discussed increasing workloads at cardiac rehab, and patient is amenable to this.    Expected Outcomes Increase workloads as tolerated to help improve cardiorespiratory fitness. Patient will continue daily exercise routine to help achieve personal health and fitness goals. Pt will begin to walk at home on days off from Phase 2 C/R and abide by the exercise prescription components and safety considerations. Continue to progress workloads as tolerated to help improve cardiorespiratory fitness.           Nutrition & Weight - Outcomes:  Pre Biometrics - 07/03/19 1419      Pre Biometrics   Waist Circumference 41.75 inches    Hip Circumference 45 inches    Waist to Hip Ratio 0.93 %    Triceps Skinfold 35 mm    % Body Fat 44.9 %    Grip Strength 27 kg    Flexibility 10.25 in    Single Leg Stand 5.31 seconds  Nutrition:  Nutrition Therapy & Goals - 07/18/19 1439      Nutrition Therapy   Diet Heart Healthy      Personal Nutrition Goals   Nutrition Goal Pt to build a healthy plate including vegetables, fruits, whole grains, and low-fat dairy products in a heart healthy meal plan.    Personal Goal #2 Pt to create a meal plan and grocery list with heart healthy foods      Intervention Plan   Intervention Nutrition handout(s) given to patient.;Prescribe, educate and counsel regarding individualized specific dietary modifications aiming towards  targeted core components such as weight, hypertension, lipid management, diabetes, heart failure and other comorbidities.    Expected Outcomes Short Term Goal: A plan has been developed with personal nutrition goals set during dietitian appointment.;Long Term Goal: Adherence to prescribed nutrition plan.           Nutrition Discharge:  Nutrition Assessments - 07/18/19 1440      MEDFICTS Scores   Pre Score 36           Education Questionnaire Score:  Knowledge Questionnaire Score - 07/11/19 1550      Knowledge Questionnaire Score   Pre Score 16/24           See previous telephone note dated 08/20/19 in EMR

## 2019-08-20 NOTE — Telephone Encounter (Signed)
New Message  Patient is calling in to acquire about another appointment with Dr. Lovena Le. Patient was last seen by Dr. Lovena Le on 07/13/19. Patient states that she believes she is supposed to have another one this month. Please advise patient on when her next appointment should be with Dr. Lovena Le.

## 2019-08-21 ENCOUNTER — Ambulatory Visit (INDEPENDENT_AMBULATORY_CARE_PROVIDER_SITE_OTHER): Payer: Medicare Other | Admitting: Physician Assistant

## 2019-08-21 ENCOUNTER — Other Ambulatory Visit: Payer: Self-pay

## 2019-08-21 ENCOUNTER — Encounter: Payer: Self-pay | Admitting: Physician Assistant

## 2019-08-21 DIAGNOSIS — Z1283 Encounter for screening for malignant neoplasm of skin: Secondary | ICD-10-CM | POA: Diagnosis not present

## 2019-08-21 DIAGNOSIS — C4492 Squamous cell carcinoma of skin, unspecified: Secondary | ICD-10-CM

## 2019-08-21 DIAGNOSIS — L659 Nonscarring hair loss, unspecified: Secondary | ICD-10-CM

## 2019-08-21 DIAGNOSIS — D489 Neoplasm of uncertain behavior, unspecified: Secondary | ICD-10-CM | POA: Diagnosis not present

## 2019-08-21 DIAGNOSIS — D485 Neoplasm of uncertain behavior of skin: Secondary | ICD-10-CM

## 2019-08-21 DIAGNOSIS — L820 Inflamed seborrheic keratosis: Secondary | ICD-10-CM | POA: Diagnosis not present

## 2019-08-21 HISTORY — DX: Squamous cell carcinoma of skin, unspecified: C44.92

## 2019-08-21 NOTE — Patient Instructions (Signed)

## 2019-08-21 NOTE — Progress Notes (Addendum)
° °  Follow-Up Visit   Subjective  Sheila Gardner is a 84 y.o. female who presents for the following: Skin Problem (bump - left corner of mouth- x weeks).   The following portions of the chart were reviewed this encounter and updated as appropriate: Tobacco   Allergies   Meds   Problems   Med Hx   Surg Hx   Fam Hx       Objective  Well appearing patient in no apparent distress; mood and affect are within normal limits.  A full examination was performed including scalp, head, eyes, ears, nose, lips, neck, chest, axillae, abdomen, back, buttocks, bilateral upper extremities, bilateral lower extremities, hands, feet, fingers, toes, fingernails, and toenails. All findings within normal limits unless otherwise noted below.  Objective  Head - to toe: No DN or signs of NMSC  Objective  Scalp: Total scalp loss. > on the top, negative hair pull  Objective  Left corner of mouth: White nodule     Objective  Left Abdomen (side) - Lower: ISK     Assessment & Plan  Screening exam for skin cancer Head - to toe  Yearly skin exams  Alopecia Scalp  OTC rogaine 5%  Neoplasm of uncertain behavior of skin (2) Left corner of mouth  Epidermal / dermal shaving  Lesion diameter (cm):  1 Informed consent: discussed and consent obtained   Timeout: patient name, date of birth, surgical site, and procedure verified   Procedure prep:  Patient was prepped and draped in usual sterile fashion Prep type:  Chlorhexidine Anesthesia: the lesion was anesthetized in a standard fashion   Anesthetic:  1% lidocaine w/ epinephrine 1-100,000 local infiltration Instrument used: DermaBlade   Hemostasis achieved with: aluminum chloride   Outcome: patient tolerated procedure well   Post-procedure details: sterile dressing applied and wound care instructions given   Dressing type: petrolatum gauze, petrolatum and bandage    Specimen 1 - Surgical pathology Differential Diagnosis: bcc scc milia Check  Margins: No  Left Abdomen (side) - Lower  Epidermal / dermal shaving  Lesion diameter (cm):  1 Informed consent: discussed and consent obtained   Timeout: patient name, date of birth, surgical site, and procedure verified   Procedure prep:  Patient was prepped and draped in usual sterile fashion Prep type:  Chlorhexidine Anesthesia: the lesion was anesthetized in a standard fashion   Anesthetic:  1% lidocaine w/ epinephrine 1-100,000 local infiltration Instrument used: DermaBlade   Hemostasis achieved with: aluminum chloride   Outcome: patient tolerated procedure well   Post-procedure details: sterile dressing applied and wound care instructions given   Dressing type: petrolatum gauze, petrolatum and bandage    Specimen 2 - Surgical pathology Differential Diagnosis: isk Check Margins: No

## 2019-08-22 ENCOUNTER — Encounter (HOSPITAL_COMMUNITY): Payer: Medicare Other

## 2019-08-22 NOTE — Telephone Encounter (Signed)
Returned call to Sheila Gardner.  Advised to keep appt with Dr. [name] in August, and Dr. Lovena Le will see Sheila Gardner in November.  Sheila Gardner indicates understanding.

## 2019-08-23 ENCOUNTER — Encounter: Payer: Self-pay | Admitting: *Deleted

## 2019-08-23 ENCOUNTER — Telehealth: Payer: Self-pay | Admitting: *Deleted

## 2019-08-23 NOTE — Telephone Encounter (Signed)
-----   Message from Warren Danes, [place] sent at 08/23/2019 10:02 AM EDT ----- 15 minute surgery on outer mouth. ED&C

## 2019-08-23 NOTE — Telephone Encounter (Signed)
Pathology to patient. 30 min surgery appoint scheduled.

## 2019-08-24 ENCOUNTER — Encounter (HOSPITAL_COMMUNITY): Payer: Medicare Other

## 2019-08-27 ENCOUNTER — Encounter (HOSPITAL_COMMUNITY): Payer: Medicare Other

## 2019-08-29 ENCOUNTER — Encounter (HOSPITAL_COMMUNITY): Payer: Medicare Other

## 2019-08-31 ENCOUNTER — Encounter (HOSPITAL_COMMUNITY): Payer: Medicare Other

## 2019-09-03 ENCOUNTER — Telehealth: Payer: Self-pay | Admitting: Interventional Cardiology

## 2019-09-03 ENCOUNTER — Other Ambulatory Visit: Payer: Self-pay | Admitting: *Deleted

## 2019-09-03 NOTE — Telephone Encounter (Signed)
Spoke with pt and made her aware to contact PCP as instructed.  Pt has already reached out to them and awaiting a call back.  Pt appreciative for call.

## 2019-09-03 NOTE — Telephone Encounter (Signed)
    Pt said she's been feeling nauseous, lightheadedness, a little SOB and no energy, she doesn't know what causing it. She said she checked her BP and HR and it's normal. She wanted to speak with Dr. [name] nurse to figure out what is going on with her.

## 2019-09-03 NOTE — Telephone Encounter (Signed)
Agree with recommendation to call PCP

## 2019-09-03 NOTE — Telephone Encounter (Signed)
Left message to call back  

## 2019-09-03 NOTE — Telephone Encounter (Signed)
Pt states she has been having intermittent nausea and lightheadedness since Friday.  SOB and no energy has been consistent since Friday.  Vitals today were 144/64, 52 and 125/64, 52.  She checked it with two different machines.  States her machine usually tells her when she is out of rhythm and it is not saying that now, and she doesn't feel like she's in AF.  Denies swelling.  Does have some slight tightness in chest when SOB.  SOB better today.  Increased fluids yesterday because she thought she might be dehydrated.  Advised pt to contact PCP about nausea and I will send message to Dr. [name] about SOB.

## 2019-09-04 ENCOUNTER — Encounter (HOSPITAL_COMMUNITY): Payer: Self-pay

## 2019-09-04 ENCOUNTER — Ambulatory Visit (HOSPITAL_COMMUNITY)
Admission: EM | Admit: 2019-09-04 | Discharge: 2019-09-04 | Disposition: A | Discharge status: Home / Self Care | Payer: Medicare Other | Attending: Family Medicine | Admitting: Family Medicine

## 2019-09-04 ENCOUNTER — Other Ambulatory Visit: Payer: Self-pay

## 2019-09-04 DIAGNOSIS — R110 Nausea: Secondary | ICD-10-CM | POA: Diagnosis not present

## 2019-09-04 LAB — CBC
HCT: 31.8 % — ABNORMAL LOW (ref 36.0–46.0)
Hemoglobin: 10.8 g/dL — ABNORMAL LOW (ref 12.0–15.0)
MCH: 31 pg (ref 26.0–34.0)
MCHC: 34 g/dL (ref 30.0–36.0)
MCV: 91.4 fL (ref 80.0–100.0)
Platelets: 288 10*3/uL (ref 150–400)
RBC: 3.48 MIL/uL — ABNORMAL LOW (ref 3.87–5.11)
RDW: 12.8 % (ref 11.5–15.5)
WBC: 9.8 10*3/uL (ref 4.0–10.5)
nRBC: 0 % (ref 0.0–0.2)

## 2019-09-04 LAB — BASIC METABOLIC PANEL
Anion gap: 9 (ref 5–15)
BUN: 17 mg/dL (ref 8–23)
CO2: 28 mmol/L (ref 22–32)
Calcium: 9.7 mg/dL (ref 8.9–10.3)
Chloride: 93 mmol/L — ABNORMAL LOW (ref 98–111)
Creatinine, Ser: 0.83 mg/dL (ref 0.44–1.00)
GFR calc Af Amer: >60 mL/min (ref >60)
GFR calc non Af Amer: >60 mL/min (ref >60)
Glucose, Bld: 117 mg/dL — ABNORMAL HIGH (ref 70–99)
Potassium: 4.5 mmol/L (ref 3.5–5.1)
Sodium: 130 mmol/L — ABNORMAL LOW (ref 135–145)

## 2019-09-04 LAB — POCT URINALYSIS DIP (DEVICE)
Bilirubin Urine: NEGATIVE
Glucose, UA: NEGATIVE mg/dL
Hgb urine dipstick: NEGATIVE
Ketones, ur: NEGATIVE mg/dL
Nitrite: NEGATIVE
Protein, ur: 30 mg/dL — AB
Specific Gravity, Urine: 1.01 (ref 1.005–1.030)
Urobilinogen, UA: 1 mg/dL (ref 0.0–1.0)
pH: 7 (ref 5.0–8.0)

## 2019-09-04 MED ORDER — CEFDINIR 300 MG PO CAPS
300.0000 mg | ORAL_CAPSULE | Freq: Two times a day (BID) | ORAL | 0 refills | Status: DC
Start: 2019-09-04 — End: 2019-10-26

## 2019-09-04 NOTE — ED Triage Notes (Signed)
Pt presents with nausea for about a week with no other complaints.

## 2019-09-05 NOTE — ED Provider Notes (Signed)
[facility]   [id number] 09/04/19 Arrival Time: 4827  ASSESSMENT & PLAN:  1. Nausea     Urine culture sent.  Begin: Meds ordered this encounter  Medications  . cefdinir (OMNICEF) 300 MG capsule    Sig: Take 1 capsule (300 mg total) by mouth 2 (two) times daily.    Dispense:  14 capsule    Refill:  0    Labs Reviewed  CBC - Abnormal; Notable for the following components:      Result Value   RBC 3.48 (*)    Hemoglobin 10.8 (*)    HCT 31.8 (*)    All other components within normal limits  BASIC METABOLIC PANEL - Abnormal; Notable for the following components:   Sodium 130 (*)    Chloride 93 (*)    Glucose, Bld 117 (*)    All other components within normal limits  POCT URINALYSIS DIP (DEVICE) - Abnormal; Notable for the following components:   Protein, ur 30 (*)    Leukocytes,Ua TRACE (*)    All other components within normal limits  URINE CULTURE   Will have RN call patient regarding labs. I had placed her on an antibiotic that would cover UTI and sinusitis with the thought that these may be causing her nausea. Na of 130 may be playing a part in this. If she begins to feel worse may need ED evaluation for treatment of hyponatremia. Will do her best to ensure adequate fluid intake.   Follow-up Information    Lawerance Cruel, MD.   Specialty: Family Medicine Why: If worsening or failing to improve as anticipated. Contact information: [place] [address] [phone]               Reviewed expectations re: course of current medical issues. Questions answered. Outlined signs and symptoms indicating need for more acute intervention. Understanding verbalized. After Visit Summary given.   SUBJECTIVE: History from: patient. Sheila Gardner is a 84 y.o. female who reports persistent nausea without vomiting; gradual onset over the past week; generalized fatigue. Afebrile. Questions mild facial pressure; similar with sinusitis in  the past. Normal bowel/bladder habits reported. No abdominal or back pain. Sleeping normally. Son is with her; he reports no confusion or mental status changes. No new medications recently.   OBJECTIVE:  Vitals:   09/04/19 1523  BP: (!) 144/49  Pulse: 68  Resp: 17  Temp: 98.1 F (36.7 C)  TempSrc: Oral  SpO2: 96%    General appearance: alert; no distress Eyes: PERRLA; EOMI; conjunctiva normal HENT: ; AT; nasal mucosa normal; oral mucosa normal; mild congestion Neck: supple  Lungs: speaks full sentences without difficulty; unlabored CV: reg Extremities: no edema Skin: warm and dry Neurologic: normal gait Psychological: alert and cooperative; normal mood and affect  Labs: Results for orders placed or performed during the hospital encounter of 09/04/19  CBC  Result Value Ref Range   WBC 9.8 4.0 - 10.5 K/uL   RBC 3.48 (L) 3.87 - 5.11 MIL/uL   Hemoglobin 10.8 (L) 12.0 - 15.0 g/dL   HCT 31.8 (L) 36 - 46 %   MCV 91.4 80.0 - 100.0 fL   MCH 31.0 26.0 - 34.0 pg   MCHC 34.0 30.0 - 36.0 g/dL   RDW 12.8 11.5 - 15.5 %   Platelets 288 150 - 400 K/uL   nRBC 0.0 0.0 - 0.2 %  Basic metabolic panel  Result Value Ref Range   Sodium 130 (L) 135 -  145 mmol/L   Potassium 4.5 3.5 - 5.1 mmol/L   Chloride 93 (L) 98 - 111 mmol/L   CO2 28 22 - 32 mmol/L   Glucose, Bld 117 (H) 70 - 99 mg/dL   BUN 17 8 - 23 mg/dL   Creatinine, Ser 0.83 0.44 - 1.00 mg/dL   Calcium 9.7 8.9 - 10.3 mg/dL   GFR calc non Af Amer >60 >60 mL/min   GFR calc Af Amer >60 >60 mL/min   Anion gap 9 5 - 15  POCT urinalysis dip (device)  Result Value Ref Range   Glucose, UA NEGATIVE NEGATIVE mg/dL   Bilirubin Urine NEGATIVE NEGATIVE   Ketones, ur NEGATIVE NEGATIVE mg/dL   Specific Gravity, Urine 1.010 1.005 - 1.030   Hgb urine dipstick NEGATIVE NEGATIVE   pH 7.0 5.0 - 8.0   Protein, ur 30 (A) NEGATIVE mg/dL   Urobilinogen, UA 1.0 0.0 - 1.0 mg/dL   Nitrite NEGATIVE NEGATIVE   Leukocytes,Ua TRACE (A) NEGATIVE       No Known Allergies  Past Medical History:  Diagnosis Date  . Arthritis   . Atrial fibrillation ([place])   . Breast cancer ([place])   . Cancer ([place])    Breast- rt  . Cough with sputum 2016  . Elevated liver function tests 05/09/2013  . GERD (gastroesophageal reflux disease)    COSTOCHONDRITIS  . Heart murmur   . Hip pain 05/08/2014  . Hyperlipidemia   . Hypertension   . Lipoma    back of neck  . PONV (postoperative nausea and vomiting)    only after rt hip surgery  . Primary osteoarthritis of left knee 11/04/2015  . Primary osteoarthritis of right hip 07/30/2014  . Squamous cell carcinoma of skin 08/21/2019   atypical squa. proliferation-Left corner of mouth   Social History   Socioeconomic History  . Marital status: Widowed    Spouse name: Not on file  . Number of children: Not on file  . Years of education: Not on file  . Highest education level: Not on file  Occupational History  . Not on file  Tobacco Use  . Smoking status: Never Smoker  . Smokeless tobacco: Never Used  Vaping Use  . Vaping Use: Never used  Substance and Sexual Activity  . Alcohol use: Not Currently  . Drug use: No  . Sexual activity: Not on file  Other Topics Concern  . Not on file  Social History Narrative  . Not on file   Social Determinants of Health   Financial Resource Strain:   . Difficulty of Paying Living Expenses:   Food Insecurity:   . Worried About Charity fundraiser in the Last Year:   . Arboriculturist in the Last Year:   Transportation Needs:   . Film/video editor (Medical):   Marland Kitchen Lack of Transportation (Non-Medical):   Physical Activity:   . Days of Exercise per Week:   . Minutes of Exercise per Session:   Stress:   . Feeling of Stress :   Social Connections:   . Frequency of Communication with Friends and Family:   . Frequency of Social Gatherings with Friends and Family:   . Attends Religious Services:   . Active Member of Clubs or Organizations:   . Attends  Archivist Meetings:   Marland Kitchen Marital Status:   Intimate Partner Violence:   . Fear of Current or Ex-Partner:   . Emotionally Abused:   Marland Kitchen Physically Abused:   .  Sexually Abused:    Family History  Problem Relation Age of Onset  . Heart disease Mother   . Cancer Brother        colon, pancreatic, brain   Past Surgical History:  Procedure Laterality Date  . ANKLE FRACTURE SURGERY Right 2013   RIGHT   . BREAST LUMPECTOMY Right 2011  . BREAST SURGERY  04/22/2009   Rt lumpectomy  . CARDIOVERSION N/A 02/19/2019   Procedure: CARDIOVERSION;  Surgeon: Dorothy Spark, MD;  Location: [facility] ENDOSCOPY;  Service: Cardiovascular;  Laterality: N/A;  . CORONARY ATHERECTOMY N/A 04/30/2019   Procedure: CORONARY ATHERECTOMY;  Surgeon: Belva Crome, MD;  Location: [place] CV LAB;  Service: Cardiovascular;  Laterality: N/A;  LAD  . CORONARY STENT INTERVENTION N/A 04/30/2019   Procedure: CORONARY STENT INTERVENTION;  Surgeon: Belva Crome, MD;  Location: [place] CV LAB;  Service: Cardiovascular;  Laterality: N/A;  DES TO PROX-MID LAD  . EYE SURGERY  2001   macular hole  . JOINT REPLACEMENT  2016  . KNEE SURGERY    . LEFT HEART CATH AND CORONARY ANGIOGRAPHY N/A 04/30/2019   Procedure: LEFT HEART CATH AND CORONARY ANGIOGRAPHY;  Surgeon: Belva Crome, MD;  Location: [place] CV LAB;  Service: Cardiovascular;  Laterality: N/A;  . MIDDLE EAR SURGERY Left 08/18/2015   repair eardrum and reconstruction  . OVARY SURGERY  40 years ago   wedge  . TOTAL HIP ARTHROPLASTY Right 07/30/2014   Procedure: TOTAL HIP ARTHROPLASTY ANTERIOR APPROACH;  Surgeon: Melrose Nakayama, MD;  Location: [place] [facility];  Service: Orthopedics;  Laterality: Right;  . TOTAL KNEE ARTHROPLASTY Left 11/04/2015  . TOTAL KNEE ARTHROPLASTY Left 11/04/2015   Procedure: TOTAL KNEE ARTHROPLASTY;  Surgeon: Melrose Nakayama, MD;  Location: [place];  Service: Orthopedics;  Laterality: Left;  . TYMPANOPLASTY  30 years ago     Vanessa Kick,  MD 09/05/19 1115

## 2019-09-06 ENCOUNTER — Telehealth (HOSPITAL_COMMUNITY): Payer: Self-pay | Admitting: Orthopedic Surgery

## 2019-09-06 NOTE — Telephone Encounter (Signed)
Per MD Hagler pts Na 130, pt should ensure adequate hydration and follow up with PCP. Pt should go to the ED if she is feeling worse.   Patient contacted by phone and made aware. Pt verbalized understanding and had all questions answered.

## 2019-09-07 LAB — URINE CULTURE: Culture: 20000 — AB

## 2019-09-17 ENCOUNTER — Telehealth: Payer: Self-pay | Admitting: Interventional Cardiology

## 2019-09-17 NOTE — Telephone Encounter (Signed)
Pt c/o Syncope: STAT if syncope occurred within 30 minutes and pt complains of lightheadedness High Priority if episode of passing out, completely, today or in last 24 hours   1. Did you pass out today? No  2. When is the last time you passed out? Passed out Thursday 7/8  3. Has this occurred multiple times? No, this was the first time.   4. Did you have any symptoms prior to passing out? She states she had been dizzy for a while but states it was controllable until passing out.     Patient states she has been very sick, 2-3 weeks. Went to PCP and UC with her symptoms. Not as nauseous as she has been, but still feels terrible. Hasn't fainted since then, but still dizzy.

## 2019-09-17 NOTE — Telephone Encounter (Signed)
Pt can't remember if she passed out Wednesday or Thursday last week, but states she became very dizzy and suddenly went out.  Didn't call sooner because she wanted to see if she felt better over the weekend.  Still having intermittent dizziness but hasn't passed out anymore since.  Dizziness resolves when she is lying down.  Didn't have BP readings with her but states they have been fine and HRs 51-61.  Scheduled pt to come in to see Dr. Lovena Le on Wednesday.  Pt appreciative for call.

## 2019-09-19 ENCOUNTER — Ambulatory Visit (INDEPENDENT_AMBULATORY_CARE_PROVIDER_SITE_OTHER): Payer: Medicare Other | Admitting: Internal Medicine

## 2019-09-19 ENCOUNTER — Encounter: Payer: Self-pay | Admitting: Internal Medicine

## 2019-09-19 ENCOUNTER — Other Ambulatory Visit: Payer: Self-pay

## 2019-09-19 VITALS — BP 108/60 | HR 50 | Ht 64.5 in | Wt 176.6 lb

## 2019-09-19 DIAGNOSIS — I2584 Coronary atherosclerosis due to calcified coronary lesion: Secondary | ICD-10-CM | POA: Diagnosis not present

## 2019-09-19 DIAGNOSIS — H838X3 Other specified diseases of inner ear, bilateral: Secondary | ICD-10-CM | POA: Diagnosis not present

## 2019-09-19 DIAGNOSIS — R55 Syncope and collapse: Secondary | ICD-10-CM

## 2019-09-19 DIAGNOSIS — I4819 Other persistent atrial fibrillation: Secondary | ICD-10-CM | POA: Diagnosis not present

## 2019-09-19 DIAGNOSIS — I2510 Atherosclerotic heart disease of native coronary artery without angina pectoris: Secondary | ICD-10-CM

## 2019-09-19 DIAGNOSIS — R42 Dizziness and giddiness: Secondary | ICD-10-CM | POA: Diagnosis not present

## 2019-09-19 DIAGNOSIS — H903 Sensorineural hearing loss, bilateral: Secondary | ICD-10-CM | POA: Diagnosis not present

## 2019-09-19 MED ORDER — DOFETILIDE 125 MCG PO CAPS: 125.0000 ug | ORAL_CAPSULE | Freq: Two times a day (BID) | ORAL | 11 refills | Status: DC

## 2019-09-19 NOTE — Patient Instructions (Addendum)
Medication Instructions:  Your physician has recommended you make the following change in your medication:  1 Do NOT take any metoprolol  2 Reduce your Dofetilide  125 mg 1 capsule by mouth twice a day  *If you need a refill on your cardiac medications before your next appointment, please call your pharmacy*  Lab Work: None ordered.  If you have labs (blood work) drawn today and your tests are completely normal, you will receive your results only by: Marland Kitchen MyChart Message (if you have MyChart) OR . A paper copy in the mail If you have any lab test that is abnormal or we need to change your treatment, we will call you to review the results.  Testing/Procedures: None ordered.  Follow-Up: At [facility], you and your health needs are our priority.  As part of our continuing mission to provide you with exceptional heart care, we have created designated Provider Care Teams.  These Care Teams include your primary Cardiologist (physician) and Advanced Practice Providers (APPs -  Physician Assistants and Nurse Practitioners) who all work together to provide you with the care you need, when you need it.  We recommend signing up for the patient portal called "MyChart".  Sign up information is provided on this After Visit Summary.  MyChart is used to connect with patients for Virtual Visits (Telemedicine).  Patients are able to view lab/test results, encounter notes, upcoming appointments, etc.  Non-urgent messages can be sent to your provider as well.   To learn more about what you can do with MyChart, go to [email or web address].    Your next appointment:   2 week nurse follow up: October 04, 2019 10am at the [address] office

## 2019-09-19 NOTE — Progress Notes (Signed)
HPI Sheila Gardner returns today for followup after experiencing a syncopal episode. She is a pleasant 85 yo woman with PAF who was placed on dofetilide. She was found to have prolongation of her QT interval when I saw her last. She had her dose of dofetide reduced. She then developed nausea and was treated with zofran. She then developed constipation and a UTI. She was getting to the bathroom and she got dizzy and passed out. She did not have palpitations. She also notes dyspnea with exertion and she has been on lasix daily.   No Known Allergies   Current Outpatient Medications  Medication Sig Dispense Refill  . acetaminophen (TYLENOL) 650 MG CR tablet Take 1,300 mg by mouth every 8 (eight) hours as needed for pain.    . Ascorbic Acid (VITAMIN C) 1000 MG tablet Take 1,000 mg by mouth daily.     Marland Kitchen BIOTIN PO Take 10,000 Units by mouth daily with breakfast.     . cefdinir (OMNICEF) 300 MG capsule Take 1 capsule (300 mg total) by mouth 2 (two) times daily. 14 capsule 0  . Cholecalciferol (VITAMIN D-3) 1000 UNITS CAPS Take 1,000 Units by mouth daily with breakfast.     . cyanocobalamin 1000 MCG tablet Take 1,000 mcg by mouth daily.     Marland Kitchen dofetilide (TIKOSYN) 250 MCG capsule Take 1 capsule (250 mcg total) by mouth 2 (two) times daily. 60 capsule 11  . ELIQUIS 5 MG TABS tablet Take 1 tablet (5 mg total) by mouth 2 (two) times daily. 60 tablet 5  . esomeprazole (NEXIUM) 20 MG capsule Take 20 mg by mouth daily at 12 noon.    . ferrous sulfate 325 (65 FE) MG tablet Take 325 mg by mouth daily with breakfast.    . furosemide (LASIX) 40 MG tablet Take 1 tablet (40 mg total) by mouth daily. 90 tablet 3  . gabapentin (NEURONTIN) 100 MG capsule Take 100 mg by mouth 2 (two) times daily.    Marland Kitchen LORazepam (ATIVAN) 1 MG tablet Take 1 mg by mouth at bedtime.     Marland Kitchen losartan (COZAAR) 100 MG tablet Take 100 mg by mouth daily.     . magnesium oxide (MAG-OX) 400 (241.3 Mg) MG tablet Take 1 tablet (400 mg total)  by mouth daily. 30 tablet 6  . Melatonin 10 MG TABS Take 1 tablet by mouth as needed (sleep).    . potassium chloride (KLOR-CON) 10 MEQ tablet Take 4 tablets (40 mEq total) by mouth daily. 120 tablet 6  . prednisoLONE acetate (PRED FORTE) 1 % ophthalmic suspension Place 1 drop into both eyes daily as needed (Burning).     . pyridOXINE (VITAMIN B-6) 100 MG tablet Take 100 mg by mouth daily.    . rosuvastatin (CRESTOR) 40 MG tablet Take 1 tablet (40 mg total) by mouth daily at 6 PM. 30 tablet 6  . ticagrelor (BRILINTA) 90 MG TABS tablet Take 1 tablet (90 mg total) by mouth 2 (two) times daily. 60 tablet 11  . vitamin E 400 UNIT capsule Take 400 Units by mouth daily.     No current facility-administered medications for this visit.     Past Medical History:  Diagnosis Date  . Arthritis   . Atrial fibrillation ([place])   . Breast cancer ([place])   . Cancer ([place])    Breast- rt  . Cough with sputum 2016  . Elevated liver function tests 05/09/2013  . GERD (gastroesophageal reflux disease)  COSTOCHONDRITIS  . Heart murmur   . Hip pain 05/08/2014  . Hyperlipidemia   . Hypertension   . Lipoma    back of neck  . PONV (postoperative nausea and vomiting)    only after rt hip surgery  . Primary osteoarthritis of left knee 11/04/2015  . Primary osteoarthritis of right hip 07/30/2014  . Squamous cell carcinoma of skin 08/21/2019   atypical squa. proliferation-Left corner of mouth    ROS:   All systems reviewed and negative except as noted in the HPI.   Past Surgical History:  Procedure Laterality Date  . ANKLE FRACTURE SURGERY Right 2013   RIGHT   . BREAST LUMPECTOMY Right 2011  . BREAST SURGERY  04/22/2009   Rt lumpectomy  . CARDIOVERSION N/A 02/19/2019   Procedure: CARDIOVERSION;  Surgeon: Dorothy Spark, MD;  Location: [facility] ENDOSCOPY;  Service: Cardiovascular;  Laterality: N/A;  . CORONARY ATHERECTOMY N/A 04/30/2019   Procedure: CORONARY ATHERECTOMY;  Surgeon: Belva Crome, MD;   Location: [place] CV LAB;  Service: Cardiovascular;  Laterality: N/A;  LAD  . CORONARY STENT INTERVENTION N/A 04/30/2019   Procedure: CORONARY STENT INTERVENTION;  Surgeon: Belva Crome, MD;  Location: [place] CV LAB;  Service: Cardiovascular;  Laterality: N/A;  DES TO PROX-MID LAD  . EYE SURGERY  2001   macular hole  . JOINT REPLACEMENT  2016  . KNEE SURGERY    . LEFT HEART CATH AND CORONARY ANGIOGRAPHY N/A 04/30/2019   Procedure: LEFT HEART CATH AND CORONARY ANGIOGRAPHY;  Surgeon: Belva Crome, MD;  Location: [place] CV LAB;  Service: Cardiovascular;  Laterality: N/A;  . MIDDLE EAR SURGERY Left 08/18/2015   repair eardrum and reconstruction  . OVARY SURGERY  40 years ago   wedge  . TOTAL HIP ARTHROPLASTY Right 07/30/2014   Procedure: TOTAL HIP ARTHROPLASTY ANTERIOR APPROACH;  Surgeon: Melrose Nakayama, MD;  Location: [place];  Service: Orthopedics;  Laterality: Right;  . TOTAL KNEE ARTHROPLASTY Left 11/04/2015  . TOTAL KNEE ARTHROPLASTY Left 11/04/2015   Procedure: TOTAL KNEE ARTHROPLASTY;  Surgeon: Melrose Nakayama, MD;  Location: [place];  Service: Orthopedics;  Laterality: Left;  . TYMPANOPLASTY  30 years ago     Family History  Problem Relation Age of Onset  . Heart disease Mother   . Cancer Brother        colon, pancreatic, brain     Social History   Socioeconomic History  . Marital status: Widowed    Spouse name: Not on file  . Number of children: Not on file  . Years of education: Not on file  . Highest education level: Not on file  Occupational History  . Not on file  Tobacco Use  . Smoking status: Never Smoker  . Smokeless tobacco: Never Used  Vaping Use  . Vaping Use: Never used  Substance and Sexual Activity  . Alcohol use: Not Currently  . Drug use: No  . Sexual activity: Not on file  Other Topics Concern  . Not on file  Social History Narrative  . Not on file   Social Determinants of Health   Financial Resource Strain:   . Difficulty of  Paying Living Expenses:   Food Insecurity:   . Worried About Charity fundraiser in the Last Year:   . Arboriculturist in the Last Year:   Transportation Needs:   . Film/video editor (Medical):   Marland Kitchen Lack of Transportation (Non-Medical):   Physical Activity:   .  Days of Exercise per Week:   . Minutes of Exercise per Session:   Stress:   . Feeling of Stress :   Social Connections:   . Frequency of Communication with Friends and Family:   . Frequency of Social Gatherings with Friends and Family:   . Attends Religious Services:   . Active Member of Clubs or Organizations:   . Attends Archivist Meetings:   Marland Kitchen Marital Status:   Intimate Partner Violence:   . Fear of Current or Ex-Partner:   . Emotionally Abused:   Marland Kitchen Physically Abused:   . Sexually Abused:      BP 108/60   Pulse (!) 50   Ht 5' 4.5" (1.638 m)   Wt 176 lb 9.6 oz (80.1 kg)   SpO2 97%   BMI 29.85 kg/m   Physical Exam:  Well appearing NAD HEENT: Unremarkable Neck:  No JVD, no thyromegally Lymphatics:  No adenopathy Back:  No CVA tenderness Lungs:  Clear with no wheezes HEART:  Regular rate rhythm, no murmurs, no rubs, no clicks Abd:  soft, positive bowel sounds, no organomegally, no rebound, no guarding Ext:  2 plus pulses, no edema, no cyanosis, no clubbing Skin:  No rashes no nodules Neuro:  CN II through XII intact, motor grossly intact  EKG - sinus bradycardia with marked QT prolongation.    Assess/Plan: 1. QT prolongation - she will reduce her dofetilide again to 125 bid. She will return in two weeks for a repeat ECG 2. Syncope - the etiology is unclear. Does not sound arrhythmic but I cannot rule out. If she passes out again we will plan for an ILR. 3. PAF - she is maintaining NSR 4. Dyspnea - we discussed stopping her lasix or reducing the dose but because she gets sob with exertion, she will continue.  Salome Spotted.

## 2019-09-24 DIAGNOSIS — D649 Anemia, unspecified: Secondary | ICD-10-CM | POA: Diagnosis not present

## 2019-09-24 DIAGNOSIS — Z09 Encounter for follow-up examination after completed treatment for conditions other than malignant neoplasm: Secondary | ICD-10-CM | POA: Diagnosis not present

## 2019-09-24 DIAGNOSIS — R55 Syncope and collapse: Secondary | ICD-10-CM | POA: Diagnosis not present

## 2019-09-24 DIAGNOSIS — R0602 Shortness of breath: Secondary | ICD-10-CM | POA: Diagnosis not present

## 2019-09-24 DIAGNOSIS — R110 Nausea: Secondary | ICD-10-CM | POA: Diagnosis not present

## 2019-09-24 DIAGNOSIS — N390 Urinary tract infection, site not specified: Secondary | ICD-10-CM | POA: Diagnosis not present

## 2019-09-26 DIAGNOSIS — E1169 Type 2 diabetes mellitus with other specified complication: Secondary | ICD-10-CM | POA: Diagnosis not present

## 2019-09-26 DIAGNOSIS — R011 Cardiac murmur, unspecified: Secondary | ICD-10-CM | POA: Diagnosis not present

## 2019-09-26 DIAGNOSIS — E782 Mixed hyperlipidemia: Secondary | ICD-10-CM | POA: Diagnosis not present

## 2019-09-26 DIAGNOSIS — D649 Anemia, unspecified: Secondary | ICD-10-CM | POA: Diagnosis not present

## 2019-09-26 DIAGNOSIS — E559 Vitamin D deficiency, unspecified: Secondary | ICD-10-CM | POA: Diagnosis not present

## 2019-09-27 ENCOUNTER — Encounter: Payer: Medicare Other | Admitting: Physician Assistant

## 2019-10-04 ENCOUNTER — Ambulatory Visit (INDEPENDENT_AMBULATORY_CARE_PROVIDER_SITE_OTHER): Payer: Medicare Other

## 2019-10-04 ENCOUNTER — Other Ambulatory Visit: Payer: Self-pay

## 2019-10-04 VITALS — BP 110/64 | HR 51

## 2019-10-04 DIAGNOSIS — I4819 Other persistent atrial fibrillation: Secondary | ICD-10-CM

## 2019-10-04 DIAGNOSIS — I2510 Atherosclerotic heart disease of native coronary artery without angina pectoris: Secondary | ICD-10-CM

## 2019-10-04 NOTE — Progress Notes (Signed)
1.) Reason for visit: Repeat EKG after reducing dofetilide to 125 mcg BID  2.) Name of MD requesting visit: Dr. Lovena Le  3.) H&P: Pt on dofetilide for afib.  At last office visit her QT was long and her dofetilide dose was reduced from 250 mcg BID to 125 mcg BID  4.) ROS related to problem: Pt states she has been feeling much better recently.  States her breathing is better.    5.) Assessment and plan per MD: EKG reviewed by Dr. Curt Bears.  Per Dr. Curt Bears EKG/QT WNL.  Advised Pt to continue on her current medications.  She has follow up with Dr. [name] 10/26/19 and then follow up with Dr. Lovena Le in November 2021.  Pt aware and will keep f/u appts.

## 2019-10-19 ENCOUNTER — Encounter: Payer: Self-pay | Admitting: Physician Assistant

## 2019-10-25 NOTE — Progress Notes (Signed)
Cardiology Office Note:    Date:  10/26/2019   ID:  Sheila Gardner, DOB Jun 10, 1935, MRN [id number]  PCP:  Lawerance Cruel, MD  Cardiologist:  Sinclair Grooms, MD   Referring MD: Lawerance Cruel, MD   Chief Complaint  Patient presents with  . Coronary Artery Disease  . Atrial Fibrillation    History of Present Illness:    Sheila Gardner is a 84 y.o. female with a hx of weakness, nausea, Tachycardia-bradycardia syndrome,right bundle branch block on EKG, chronic anticoagulation,PAF,successful electrical cardioversion on December14, 2020,dofetilide therapy for rhythm control, prolonged QT requiring dose adjustment, chronic h/o CP andabnormal coronary CTA which led to LAD OA/ DE stent 2021.  She is doing well.  She has not been having angina.  She has not had significant palpitations episodes of angina.  No episodes of syncope.  No transient neurological complaints.  She is on high risk therapy that includes Brilinta and apixaban.  Her stent is beyond 6 months implantation.  Past Medical History:  Diagnosis Date  . Arthritis   . Atrial fibrillation ([place])   . Breast cancer ([place])   . Cancer ([place])    Breast- rt  . Cough with sputum 2016  . Elevated liver function tests 05/09/2013  . GERD (gastroesophageal reflux disease)    COSTOCHONDRITIS  . Heart murmur   . Hip pain 05/08/2014  . Hyperlipidemia   . Hypertension   . Lipoma    back of neck  . PONV (postoperative nausea and vomiting)    only after rt hip surgery  . Primary osteoarthritis of left knee 11/04/2015  . Primary osteoarthritis of right hip 07/30/2014  . Squamous cell carcinoma of skin 08/21/2019   atypical squa. proliferation-Left corner of mouth    Past Surgical History:  Procedure Laterality Date  . ANKLE FRACTURE SURGERY Right 2013   RIGHT   . BREAST LUMPECTOMY Right 2011  . BREAST SURGERY  04/22/2009   Rt lumpectomy  . CARDIOVERSION N/A 02/19/2019   Procedure: CARDIOVERSION;  Surgeon: Dorothy Spark, MD;  Location: [facility] ENDOSCOPY;  Service: Cardiovascular;  Laterality: N/A;  . CORONARY ATHERECTOMY N/A 04/30/2019   Procedure: CORONARY ATHERECTOMY;  Surgeon: Belva Crome, MD;  Location: [place] CV LAB;  Service: Cardiovascular;  Laterality: N/A;  LAD  . CORONARY STENT INTERVENTION N/A 04/30/2019   Procedure: CORONARY STENT INTERVENTION;  Surgeon: Belva Crome, MD;  Location: [place] CV LAB;  Service: Cardiovascular;  Laterality: N/A;  DES TO PROX-MID LAD  . EYE SURGERY  2001   macular hole  . JOINT REPLACEMENT  2016  . KNEE SURGERY    . LEFT HEART CATH AND CORONARY ANGIOGRAPHY N/A 04/30/2019   Procedure: LEFT HEART CATH AND CORONARY ANGIOGRAPHY;  Surgeon: Belva Crome, MD;  Location: [place] CV LAB;  Service: Cardiovascular;  Laterality: N/A;  . MIDDLE EAR SURGERY Left 08/18/2015   repair eardrum and reconstruction  . OVARY SURGERY  40 years ago   wedge  . TOTAL HIP ARTHROPLASTY Right 07/30/2014   Procedure: TOTAL HIP ARTHROPLASTY ANTERIOR APPROACH;  Surgeon: Melrose Nakayama, MD;  Location: [facility];  Service: Orthopedics;  Laterality: Right;  . TOTAL KNEE ARTHROPLASTY Left 11/04/2015  . TOTAL KNEE ARTHROPLASTY Left 11/04/2015   Procedure: TOTAL KNEE ARTHROPLASTY;  Surgeon: Melrose Nakayama, MD;  Location: [place];  Service: Orthopedics;  Laterality: Left;  . TYMPANOPLASTY  30 years ago    Current Medications: Current Meds  Medication Sig  . acetaminophen (  TYLENOL) 650 MG CR tablet Take 1,300 mg by mouth every 8 (eight) hours as needed for pain.  . Ascorbic Acid (VITAMIN C) 1000 MG tablet Take 1,000 mg by mouth daily.   Marland Kitchen BIOTIN PO Take 10,000 Units by mouth daily with breakfast.   . Cholecalciferol (VITAMIN D-3) 1000 UNITS CAPS Take 1,000 Units by mouth daily with breakfast.   . cyanocobalamin 1000 MCG tablet Take 1,000 mcg by mouth daily.   Marland Kitchen dofetilide (TIKOSYN) 125 MCG capsule Take 1 capsule (125 mcg total) by mouth 2 (two) times daily.  Marland Kitchen ELIQUIS 5 MG TABS tablet  Take 1 tablet (5 mg total) by mouth 2 (two) times daily.  Marland Kitchen esomeprazole (NEXIUM) 20 MG capsule Take 20 mg by mouth daily at 12 noon.  . ferrous sulfate 325 (65 FE) MG tablet Take 325 mg by mouth daily with breakfast.  . furosemide (LASIX) 40 MG tablet Take 1 tablet (40 mg total) by mouth daily.  Marland Kitchen gabapentin (NEURONTIN) 100 MG capsule Take 100 mg by mouth 2 (two) times daily.  Marland Kitchen LORazepam (ATIVAN) 1 MG tablet Take 1 mg by mouth at bedtime.   Marland Kitchen losartan (COZAAR) 100 MG tablet Take 100 mg by mouth daily.   . magnesium oxide (MAG-OX) 400 (241.3 Mg) MG tablet Take 1 tablet (400 mg total) by mouth daily.  . Melatonin 10 MG TABS Take 1 tablet by mouth as needed (sleep).  . potassium chloride (KLOR-CON) 10 MEQ tablet Take 4 tablets (40 mEq total) by mouth daily.  Marland Kitchen pyridOXINE (VITAMIN B-6) 100 MG tablet Take 100 mg by mouth daily.  . rosuvastatin (CRESTOR) 40 MG tablet Take 1 tablet (40 mg total) by mouth daily at 6 PM.  . ticagrelor (BRILINTA) 90 MG TABS tablet Take 1 tablet (90 mg total) by mouth 2 (two) times daily.  . vitamin E 400 UNIT capsule Take 400 Units by mouth daily.     Allergies:   Patient has no known allergies.   Social History   Socioeconomic History  . Marital status: Widowed    Spouse name: Not on file  . Number of children: Not on file  . Years of education: Not on file  . Highest education level: Not on file  Occupational History  . Not on file  Tobacco Use  . Smoking status: Never Smoker  . Smokeless tobacco: Never Used  Vaping Use  . Vaping Use: Never used  Substance and Sexual Activity  . Alcohol use: Not Currently  . Drug use: No  . Sexual activity: Not on file  Other Topics Concern  . Not on file  Social History Narrative  . Not on file   Social Determinants of Health   Financial Resource Strain:   . Difficulty of Paying Living Expenses: Not on file  Food Insecurity:   . Worried About Charity fundraiser in the Last Year: Not on file  . Ran Out of  Food in the Last Year: Not on file  Transportation Needs:   . Lack of Transportation (Medical): Not on file  . Lack of Transportation (Non-Medical): Not on file  Physical Activity:   . Days of Exercise per Week: Not on file  . Minutes of Exercise per Session: Not on file  Stress:   . Feeling of Stress : Not on file  Social Connections:   . Frequency of Communication with Friends and Family: Not on file  . Frequency of Social Gatherings with Friends and Family: Not on file  .  Attends Religious Services: Not on file  . Active Member of Clubs or Organizations: Not on file  . Attends Archivist Meetings: Not on file  . Marital Status: Not on file     Family History: The patient's family history includes Cancer in her brother; Heart disease in her mother.  ROS:   Please see the history of present illness.    No new data.  She is surprised that she is doing so well from the standpoint of resolution of palpitations on dofetilide.  All other systems reviewed and are negative.  EKGs/Labs/Other Studies Reviewed:    The following studies were reviewed today: No new data.  Cardiac catheterization from February was reviewed.  Proximal LAD stent with beautiful result following atherectomy.  EKG:  EKG sinus bradycardia, right bundle left anterior hemiblock.  No acute ST-T wave change compared to July 2021.  Recent Labs: 06/04/2019: ALT 15; NT-Pro BNP 744; TSH 2.050 07/13/2019: Magnesium 2.3 09/04/2019: BUN 17; Creatinine, Ser 0.83; Hemoglobin 10.8; Platelets 288; Potassium 4.5; Sodium 130  Recent Lipid Panel No results found for: CHOL, TRIG, HDL, CHOLHDL, VLDL, LDLCALC, LDLDIRECT  Physical Exam:    VS:  BP (!) 118/52   Pulse (!) 53   Ht 5' 4.5" (1.638 m)   Wt 182 lb 3.2 oz (82.6 kg)   SpO2 97%   BMI 30.79 kg/m     Wt Readings from Last 3 Encounters:  10/26/19 182 lb 3.2 oz (82.6 kg)  09/19/19 176 lb 9.6 oz (80.1 kg)  07/13/19 179 lb (81.2 kg)     GEN: Appears younger  than stated age. No acute distress HEENT: Normal NECK: No JVD. LYMPHATICS: No lymphadenopathy CARDIAC:  RRR without murmur, gallop, or edema. VASCULAR:  Normal Pulses. No bruits. RESPIRATORY:  Clear to auscultation without rales, wheezing or rhonchi  ABDOMEN: Soft, non-tender, non-distended, No pulsatile mass, MUSCULOSKELETAL: No deformity  SKIN: Warm and dry NEUROLOGIC:  Alert and oriented x 3 PSYCHIATRIC:  Normal affect   ASSESSMENT:    1. Coronary artery disease involving native coronary artery of native heart without angina pectoris   2. Paroxysmal atrial fibrillation (HCC)   3. Essential hypertension   4. Pure hypercholesterolemia   5. Chronic anticoagulation   6. Type 2 diabetes mellitus with complication, without long-term current use of insulin (HCC)   7. Right bundle branch block   8. Educated about COVID-19 virus infection    PLAN:    In order of problems listed above:  1. Coronary artery disease.  She is now 6 months post stenting of the LAD.  She is on high risk combination Brilinta and Eliquis.  Will discontinue Brilinta now to we have 6 months out.  The stent was placed in a non-ACS setting. 2. Continue dofetilide and Eliquis. 3. Continue Cozaar 100 mg/day. 4. Continue Crestor 40 mg/day. 5. Continue Eliquis and monitor BUN creatinine and hemoglobin every 6 months. 6. We did not discuss diabetes other than the need to keep hemoglobin A1c less than 7. 7. No change compared to prior. 8. She has been vaccinated.  Clinical follow-up in 6 months.  Overall education and awareness concerning primary/secondary risk prevention was discussed in detail: LDL less than 70, hemoglobin A1c less than 7, blood pressure target less than 130/80 mmHg, >150 minutes of moderate aerobic activity per week, avoidance of smoking, weight control (via diet and exercise), and continued surveillance/management of/for obstructive sleep apnea.    Medication Adjustments/Labs and Tests  Ordered: Current medicines are reviewed at  length with the patient today.  Concerns regarding medicines are outlined above.  Orders Placed This Encounter  Procedures  . EKG 12-Lead   No orders of the defined types were placed in this encounter.   There are no Patient Instructions on file for this visit.   Signed, Sinclair Grooms, MD  10/26/2019 1:58 PM    [facility]

## 2019-10-26 ENCOUNTER — Ambulatory Visit (INDEPENDENT_AMBULATORY_CARE_PROVIDER_SITE_OTHER): Payer: Medicare Other | Admitting: Interventional Cardiology

## 2019-10-26 ENCOUNTER — Encounter: Payer: Self-pay | Admitting: Interventional Cardiology

## 2019-10-26 ENCOUNTER — Other Ambulatory Visit: Payer: Self-pay

## 2019-10-26 VITALS — BP 118/52 | HR 53 | Ht 64.5 in | Wt 182.2 lb

## 2019-10-26 DIAGNOSIS — I4510 Unspecified right bundle-branch block: Secondary | ICD-10-CM | POA: Diagnosis not present

## 2019-10-26 DIAGNOSIS — I2584 Coronary atherosclerosis due to calcified coronary lesion: Secondary | ICD-10-CM | POA: Diagnosis not present

## 2019-10-26 DIAGNOSIS — I10 Essential (primary) hypertension: Secondary | ICD-10-CM | POA: Diagnosis not present

## 2019-10-26 DIAGNOSIS — Z7189 Other specified counseling: Secondary | ICD-10-CM | POA: Diagnosis not present

## 2019-10-26 DIAGNOSIS — E118 Type 2 diabetes mellitus with unspecified complications: Secondary | ICD-10-CM

## 2019-10-26 DIAGNOSIS — I2510 Atherosclerotic heart disease of native coronary artery without angina pectoris: Secondary | ICD-10-CM | POA: Diagnosis not present

## 2019-10-26 DIAGNOSIS — I480 Paroxysmal atrial fibrillation: Secondary | ICD-10-CM | POA: Diagnosis not present

## 2019-10-26 DIAGNOSIS — E7800 Pure hypercholesterolemia, unspecified: Secondary | ICD-10-CM

## 2019-10-26 DIAGNOSIS — Z7901 Long term (current) use of anticoagulants: Secondary | ICD-10-CM | POA: Diagnosis not present

## 2019-10-26 NOTE — Patient Instructions (Signed)
Medication Instructions:  1) DISCONTINUE Brilinta  *If you need a refill on your cardiac medications before your next appointment, please call your pharmacy*   Lab Work: None If you have labs (blood work) drawn today and your tests are completely normal, you will receive your results only by: Marland Kitchen MyChart Message (if you have MyChart) OR . A paper copy in the mail If you have any lab test that is abnormal or we need to change your treatment, we will call you to review the results.   Testing/Procedures: None   Follow-Up: At [facility], you and your health needs are our priority.  As part of our continuing mission to provide you with exceptional heart care, we have created designated Provider Care Teams.  These Care Teams include your primary Cardiologist (physician) and Advanced Practice Providers (APPs -  Physician Assistants and Nurse Practitioners) who all work together to provide you with the care you need, when you need it.  We recommend signing up for the patient portal called "MyChart".  Sign up information is provided on this After Visit Summary.  MyChart is used to connect with patients for Virtual Visits (Telemedicine).  Patients are able to view lab/test results, encounter notes, upcoming appointments, etc.  Non-urgent messages can be sent to your provider as well.   To learn more about what you can do with MyChart, go to [email or web address].    Your next appointment:   6 month(s)  The format for your next appointment:   In Person  Provider:   You may see Sinclair Grooms, MD or one of the following Advanced Practice Providers on your designated Care Team:    Truitt Merle, NP  Cecilie Kicks, NP  Kathyrn Drown, NP    Other Instructions

## 2019-10-29 DIAGNOSIS — H3562 Retinal hemorrhage, left eye: Secondary | ICD-10-CM | POA: Diagnosis not present

## 2019-10-29 DIAGNOSIS — H35372 Puckering of macula, left eye: Secondary | ICD-10-CM | POA: Diagnosis not present

## 2019-10-29 DIAGNOSIS — H5203 Hypermetropia, bilateral: Secondary | ICD-10-CM | POA: Diagnosis not present

## 2019-10-29 DIAGNOSIS — H524 Presbyopia: Secondary | ICD-10-CM | POA: Diagnosis not present

## 2019-10-29 DIAGNOSIS — H52223 Regular astigmatism, bilateral: Secondary | ICD-10-CM | POA: Diagnosis not present

## 2019-10-29 DIAGNOSIS — H43812 Vitreous degeneration, left eye: Secondary | ICD-10-CM | POA: Diagnosis not present

## 2019-10-29 DIAGNOSIS — H43392 Other vitreous opacities, left eye: Secondary | ICD-10-CM | POA: Diagnosis not present

## 2019-11-01 ENCOUNTER — Telehealth: Payer: Self-pay | Admitting: Interventional Cardiology

## 2019-11-01 DIAGNOSIS — R05 Cough: Secondary | ICD-10-CM | POA: Diagnosis not present

## 2019-11-01 DIAGNOSIS — Z03818 Encounter for observation for suspected exposure to other biological agents ruled out: Secondary | ICD-10-CM | POA: Diagnosis not present

## 2019-11-01 NOTE — Telephone Encounter (Signed)
° ° °  Pt said she was exposed to covid 19 and might have it. She wanted to ask Dr. [name] what do to since she is a heart patient.

## 2019-11-01 NOTE — Telephone Encounter (Signed)
Spoke with pt and advised her to call PCP so they can get her tested.  States she has done that and they are suppose to call around 10:30A with some instructions.  Advised in regards to heart, it would all depend on symptoms and severity.  Advised it can aggravate her AF and to let us know if HR becomes consistently elevated.  Pt appreciative for call.

## 2019-11-03 ENCOUNTER — Ambulatory Visit (INDEPENDENT_AMBULATORY_CARE_PROVIDER_SITE_OTHER): Payer: Medicare Other

## 2019-11-03 ENCOUNTER — Ambulatory Visit (HOSPITAL_COMMUNITY)
Admission: EM | Admit: 2019-11-03 | Discharge: 2019-11-03 | Disposition: A | Discharge status: Home / Self Care | Payer: Medicare Other | Attending: Physician Assistant | Admitting: Physician Assistant

## 2019-11-03 ENCOUNTER — Encounter (HOSPITAL_COMMUNITY): Payer: Self-pay

## 2019-11-03 ENCOUNTER — Other Ambulatory Visit: Payer: Self-pay

## 2019-11-03 DIAGNOSIS — R0789 Other chest pain: Secondary | ICD-10-CM

## 2019-11-03 DIAGNOSIS — R05 Cough: Secondary | ICD-10-CM

## 2019-11-03 DIAGNOSIS — I4891 Unspecified atrial fibrillation: Secondary | ICD-10-CM | POA: Diagnosis not present

## 2019-11-03 DIAGNOSIS — R059 Cough, unspecified: Secondary | ICD-10-CM

## 2019-11-03 DIAGNOSIS — I10 Essential (primary) hypertension: Secondary | ICD-10-CM | POA: Diagnosis not present

## 2019-11-03 NOTE — ED Provider Notes (Signed)
[facility]    CSN: [id number] Arrival date & time: 11/03/19  1520      History   Chief Complaint Chief Complaint  Patient presents with  . Chest Pain    HPI Sheila Gardner is a 84 y.o. female.   Patient presents with headache, productive cough, chest tightness, fatigue x4 days.  She had a negative COVID test on 11/01/2019.  She denies fever, chills, sore throat, shortness of breath, vomiting, diarrhea, rash, or other symptoms.  She is fully vaccinated for COVID.  She was seen by her cardiologist on 10/26/2019 for follow-up exam; diagnosis CAD, paroxysmal atrial fibrillation, hypertension, hypercholesterolemia, chronic anticoagulation, type 2 diabetes with insulin use, right bundle blanch block.  The history is provided by the patient and medical records.    Past Medical History:  Diagnosis Date  . Arthritis   . Atrial fibrillation ([place])   . Breast cancer ([place])   . Cancer ([place])    Breast- rt  . Cough with sputum 2016  . Elevated liver function tests 05/09/2013  . GERD (gastroesophageal reflux disease)    COSTOCHONDRITIS  . Heart murmur   . Hip pain 05/08/2014  . Hyperlipidemia   . Hypertension   . Lipoma    back of neck  . PONV (postoperative nausea and vomiting)    only after rt hip surgery  . Primary osteoarthritis of left knee 11/04/2015  . Primary osteoarthritis of right hip 07/30/2014  . Squamous cell carcinoma of skin 08/21/2019   atypical squa. proliferation-Left corner of mouth    Patient Active Problem List   Diagnosis Date Noted  . Syncope 09/19/2019  . Secondary hypercoagulable state ([place]) 06/15/2019  . Persistent atrial fibrillation ([place]) 06/11/2019  . CAD (coronary artery disease) 04/30/2019  . CAD (coronary artery disease), native coronary artery 04/28/2019  . Hyperlipidemia LDL goal <70 04/28/2019  . Hypertension 04/28/2019  . Primary osteoarthritis of left knee 11/04/2015  . Primary osteoarthritis of right hip 07/30/2014  . Hip pain  05/08/2014  . Elevated liver function tests 05/09/2013  . Vitamin D deficiency 03/25/2011  . Malignant neoplasm of upper-outer quadrant of right breast in female, estrogen receptor positive ([place]) 12/30/2010    Past Surgical History:  Procedure Laterality Date  . ANKLE FRACTURE SURGERY Right 2013   RIGHT   . BREAST LUMPECTOMY Right 2011  . BREAST SURGERY  04/22/2009   Rt lumpectomy  . CARDIOVERSION N/A 02/19/2019   Procedure: CARDIOVERSION;  Surgeon: Dorothy Spark, MD;  Location: [facility] ENDOSCOPY;  Service: Cardiovascular;  Laterality: N/A;  . CORONARY ATHERECTOMY N/A 04/30/2019   Procedure: CORONARY ATHERECTOMY;  Surgeon: Belva Crome, MD;  Location: [place] CV LAB;  Service: Cardiovascular;  Laterality: N/A;  LAD  . CORONARY STENT INTERVENTION N/A 04/30/2019   Procedure: CORONARY STENT INTERVENTION;  Surgeon: Belva Crome, MD;  Location: [place] CV LAB;  Service: Cardiovascular;  Laterality: N/A;  DES TO PROX-MID LAD  . EYE SURGERY  2001   macular hole  . JOINT REPLACEMENT  2016  . KNEE SURGERY    . LEFT HEART CATH AND CORONARY ANGIOGRAPHY N/A 04/30/2019   Procedure: LEFT HEART CATH AND CORONARY ANGIOGRAPHY;  Surgeon: Belva Crome, MD;  Location: [place] CV LAB;  Service: Cardiovascular;  Laterality: N/A;  . MIDDLE EAR SURGERY Left 08/18/2015   repair eardrum and reconstruction  . OVARY SURGERY  40 years ago   wedge  . TOTAL HIP ARTHROPLASTY Right 07/30/2014   Procedure: TOTAL HIP ARTHROPLASTY  ANTERIOR APPROACH;  Surgeon: Melrose Nakayama, MD;  Location: [place];  Service: Orthopedics;  Laterality: Right;  . TOTAL KNEE ARTHROPLASTY Left 11/04/2015  . TOTAL KNEE ARTHROPLASTY Left 11/04/2015   Procedure: TOTAL KNEE ARTHROPLASTY;  Surgeon: Melrose Nakayama, MD;  Location: [facility];  Service: Orthopedics;  Laterality: Left;  . TYMPANOPLASTY  30 years ago    OB History   No obstetric history on file.      Home Medications    Prior to Admission medications   Medication  Sig Start Date End Date Taking? Authorizing Provider  acetaminophen (TYLENOL) 650 MG CR tablet Take 1,300 mg by mouth every 8 (eight) hours as needed for pain.    [provider]  Ascorbic Acid (VITAMIN C) 1000 MG tablet Take 1,000 mg by mouth daily.     [provider]  BIOTIN PO Take 10,000 Units by mouth daily with breakfast.     [provider]  Cholecalciferol (VITAMIN D-3) 1000 UNITS CAPS Take 1,000 Units by mouth daily with breakfast.     [provider]  cyanocobalamin 1000 MCG tablet Take 1,000 mcg by mouth daily.     [provider]  dofetilide (TIKOSYN) 125 MCG capsule Take 1 capsule (125 mcg total) by mouth 2 (two) times daily. 09/19/19   Evans Lance, MD  ELIQUIS 5 MG TABS tablet Take 1 tablet (5 mg total) by mouth 2 (two) times daily. 07/19/19   Belva Crome, MD  esomeprazole (NEXIUM) 20 MG capsule Take 20 mg by mouth daily at 12 noon.    [provider]  ferrous sulfate 325 (65 FE) MG tablet Take 325 mg by mouth daily with breakfast.    [provider]  furosemide (LASIX) 40 MG tablet Take 1 tablet (40 mg total) by mouth daily. 07/16/19 10/26/19  Evans Lance, MD  gabapentin (NEURONTIN) 100 MG capsule Take 100 mg by mouth 2 (two) times daily. 08/09/19   [provider]  LORazepam (ATIVAN) 1 MG tablet Take 1 mg by mouth at bedtime.     [provider]  losartan (COZAAR) 100 MG tablet Take 100 mg by mouth daily.     [provider]  magnesium oxide (MAG-OX) 400 (241.3 Mg) MG tablet Take 1 tablet (400 mg total) by mouth daily. 06/15/19   Shirley Friar, PA-C  Melatonin 10 MG TABS Take 1 tablet by mouth as needed (sleep).    [provider]  potassium chloride (KLOR-CON) 10 MEQ tablet Take 4 tablets (40 mEq total) by mouth daily. 06/14/19   Shirley Friar, PA-C  pyridOXINE (VITAMIN B-6) 100 MG tablet Take 100 mg by mouth daily.    [provider]  rosuvastatin  (CRESTOR) 40 MG tablet Take 1 tablet (40 mg total) by mouth daily at 6 PM. 05/01/19   Bhagat, Bhavinkumar, PA  vitamin E 400 UNIT capsule Take 400 Units by mouth daily.    [provider]    Family History Family History  Problem Relation Age of Onset  . Heart disease Mother   . Cancer Brother        colon, pancreatic, brain    Social History Social History   Tobacco Use  . Smoking status: Never Smoker  . Smokeless tobacco: Never Used  Vaping Use  . Vaping Use: Never used  Substance Use Topics  . Alcohol use: Not Currently  . Drug use: No     Allergies   Patient has no known allergies.  Review of Systems Review of Systems  Constitutional: Positive for fatigue. Negative for chills, diaphoresis and fever.  HENT: Negative for ear pain and sore throat.   Eyes: Negative for pain and visual disturbance.  Respiratory: Positive for cough and chest tightness. Negative for shortness of breath.   Cardiovascular: Negative for chest pain and palpitations.  Gastrointestinal: Negative for abdominal pain, diarrhea and vomiting.  Genitourinary: Negative for dysuria and hematuria.  Musculoskeletal: Negative for arthralgias and back pain.  Skin: Negative for color change and rash.  Neurological: Positive for headaches. Negative for seizures, syncope, weakness and numbness.  All other systems reviewed and are negative.    Physical Exam Triage Vital Signs ED Triage Vitals  Enc Vitals Group     BP 11/03/19 1554 (!) 138/58     Pulse Rate 11/03/19 1554 63     Resp 11/03/19 1554 16     Temp 11/03/19 1554 100.2 F (37.9 C)     Temp Source 11/03/19 1554 Oral     SpO2 11/03/19 1554 97 %     Weight 11/03/19 1555 180 lb (81.6 kg)     Height 11/03/19 1555 5' 4.5" (1.638 m)     Head Circumference --      Peak Flow --      Pain Score 11/03/19 1554 6     Pain Loc --      Pain Edu? --      Excl. in [place]? --    No data found.  Updated Vital Signs BP (!) 138/58   Pulse 63    Temp 100.2 F (37.9 C) (Oral)   Resp 16   Ht 5' 4.5" (1.638 m)   Wt 180 lb (81.6 kg)   SpO2 97%   BMI 30.42 kg/m   Visual Acuity Right Eye Distance:   Left Eye Distance:   Bilateral Distance:    Right Eye Near:   Left Eye Near:    Bilateral Near:     Physical Exam Vitals and nursing note reviewed.  Constitutional:      General: She is not in acute distress.    Appearance: She is well-developed. She is not ill-appearing.  HENT:     Head: Normocephalic and atraumatic.     Mouth/Throat:     Mouth: Mucous membranes are moist.     Pharynx: Oropharynx is clear.  Eyes:     Conjunctiva/sclera: Conjunctivae normal.  Cardiovascular:     Rate and Rhythm: Normal rate and regular rhythm.     Heart sounds: No murmur heard.   Pulmonary:     Effort: Pulmonary effort is normal. No respiratory distress.     Breath sounds: Rhonchi present. No wheezing or rales.     Comments: Few scattered rhonchi throughout which clear with cough. Abdominal:     Palpations: Abdomen is soft.     Tenderness: There is no abdominal tenderness. There is no guarding or rebound.  Musculoskeletal:        General: No swelling.     Cervical back: Neck supple.     Right lower leg: No edema.     Left lower leg: No edema.  Skin:    General: Skin is warm and dry.     Findings: No rash.  Neurological:     General: No focal deficit present.     Mental Status: She is alert and oriented to person, place, and time.     Gait: Gait normal.  Psychiatric:        Mood  and Affect: Mood normal.        Behavior: Behavior normal.      UC Treatments / Results  Labs (all labs ordered are listed, but only abnormal results are displayed) Labs Reviewed - No data to display  EKG   Radiology DG Chest 2 View  Result Date: 11/03/2019 CLINICAL DATA:  Chest tightness since Wednesday, has become progressively more painful, had COVID vaccinations, tested COVID-19 negative Thursday, past history of atrial fibrillation,  breast cancer, hypertension EXAM: CHEST - 2 VIEW COMPARISON:  09/13/2019 FINDINGS: Borderline enlargement of cardiac silhouette. Mediastinal contours and pulmonary vascularity normal. Atherosclerotic calcification aorta. Mild chronic bronchitic changes without pulmonary infiltrate, IMPRESSION: No active cardiopulmonary disease. Electronically Signed   By: Lavonia Dana M.D.   On: 11/03/2019 17:41    Procedures Procedures (including critical care time)  Medications Ordered in UC Medications - No data to display  Initial Impression / Assessment and Plan / UC Course  I have reviewed the triage vital signs and the nursing notes.  Pertinent labs & imaging results that were available during my care of the patient were reviewed by me and considered in my medical decision making (see chart for details).   Cough, chest tightness.  EKG shows sinus bradycardia, rate 49, no ST elevation, compared to previous from 10/26/2019.  CXR negative.  Instructed patient to follow-up with her PCP next week.  Instructed her to go to the ED if she has acute worsening symptoms.  Patient agrees to plan of care.   Final Clinical Impressions(s) / UC Diagnoses   Final diagnoses:  Cough  Chest tightness     Discharge Instructions     Your chest x-ray is normal.  Your EKG appears unchanged.    Follow-up with your primary care provider next week.    Go to the emergency department if you have acute worsening symptoms.        ED Prescriptions    None     PDMP not reviewed this encounter.   Sharion Balloon, NP 11/03/19 [phone]

## 2019-11-03 NOTE — ED Triage Notes (Signed)
Pt c/o 5/10 intermittent non radiating mid sternal chest tightness, HA, fatiguex4 days. Pt states had COVID test 3 days ago and it was neg. Pt has non labored breathing. Skin is dry. PT denies N/V, SOB.

## 2019-11-03 NOTE — Discharge Instructions (Signed)
Your chest x-ray is normal.  Your EKG appears unchanged.    Follow-up with your primary care provider next week.    Go to the emergency department if you have acute worsening symptoms.

## 2019-11-05 DIAGNOSIS — R05 Cough: Secondary | ICD-10-CM | POA: Diagnosis not present

## 2019-11-05 DIAGNOSIS — R0981 Nasal congestion: Secondary | ICD-10-CM | POA: Diagnosis not present

## 2019-11-05 DIAGNOSIS — R432 Parageusia: Secondary | ICD-10-CM | POA: Diagnosis not present

## 2019-11-05 DIAGNOSIS — Z20828 Contact with and (suspected) exposure to other viral communicable diseases: Secondary | ICD-10-CM | POA: Diagnosis not present

## 2019-11-05 DIAGNOSIS — R430 Anosmia: Secondary | ICD-10-CM | POA: Diagnosis not present

## 2019-11-06 ENCOUNTER — Emergency Department (HOSPITAL_COMMUNITY)
Admission: EM | Admit: 2019-11-06 | Discharge: 2019-11-07 | Discharge status: Against Medical Advice | Payer: Medicare Other | Attending: Emergency Medicine | Admitting: Emergency Medicine

## 2019-11-06 ENCOUNTER — Other Ambulatory Visit: Payer: Self-pay

## 2019-11-06 ENCOUNTER — Other Ambulatory Visit: Payer: Self-pay | Admitting: Nurse Practitioner

## 2019-11-06 DIAGNOSIS — C50411 Malignant neoplasm of upper-outer quadrant of right female breast: Secondary | ICD-10-CM

## 2019-11-06 DIAGNOSIS — R531 Weakness: Secondary | ICD-10-CM | POA: Insufficient documentation

## 2019-11-06 DIAGNOSIS — I491 Atrial premature depolarization: Secondary | ICD-10-CM | POA: Diagnosis not present

## 2019-11-06 DIAGNOSIS — U071 COVID-19: Secondary | ICD-10-CM

## 2019-11-06 DIAGNOSIS — I4510 Unspecified right bundle-branch block: Secondary | ICD-10-CM | POA: Diagnosis not present

## 2019-11-06 DIAGNOSIS — I10 Essential (primary) hypertension: Secondary | ICD-10-CM

## 2019-11-06 DIAGNOSIS — I4819 Other persistent atrial fibrillation: Secondary | ICD-10-CM

## 2019-11-06 DIAGNOSIS — R05 Cough: Secondary | ICD-10-CM | POA: Diagnosis not present

## 2019-11-06 DIAGNOSIS — R001 Bradycardia, unspecified: Secondary | ICD-10-CM | POA: Diagnosis not present

## 2019-11-06 DIAGNOSIS — Z170 Estrogen receptor positive status [ER+]: Secondary | ICD-10-CM

## 2019-11-06 LAB — BASIC METABOLIC PANEL
Anion gap: 11 (ref 5–15)
BUN: 11 mg/dL (ref 8–23)
CO2: 25 mmol/L (ref 22–32)
Calcium: 9.1 mg/dL (ref 8.9–10.3)
Chloride: 92 mmol/L — ABNORMAL LOW (ref 98–111)
Creatinine, Ser: 0.71 mg/dL (ref 0.44–1.00)
GFR calc Af Amer: >60 mL/min (ref >60)
GFR calc non Af Amer: >60 mL/min (ref >60)
Glucose, Bld: 102 mg/dL — ABNORMAL HIGH (ref 70–99)
Potassium: 4.1 mmol/L (ref 3.5–5.1)
Sodium: 128 mmol/L — ABNORMAL LOW (ref 135–145)

## 2019-11-06 LAB — CBC
HCT: 31.1 % — ABNORMAL LOW (ref 36.0–46.0)
Hemoglobin: 10.1 g/dL — ABNORMAL LOW (ref 12.0–15.0)
MCH: 30.1 pg (ref 26.0–34.0)
MCHC: 32.5 g/dL (ref 30.0–36.0)
MCV: 92.8 fL (ref 80.0–100.0)
Platelets: 238 10*3/uL (ref 150–400)
RBC: 3.35 MIL/uL — ABNORMAL LOW (ref 3.87–5.11)
RDW: 12.8 % (ref 11.5–15.5)
WBC: 4.9 10*3/uL (ref 4.0–10.5)
nRBC: 0 % (ref 0.0–0.2)

## 2019-11-06 LAB — URINALYSIS, ROUTINE W REFLEX MICROSCOPIC
Bilirubin Urine: NEGATIVE
Glucose, UA: NEGATIVE mg/dL
Hgb urine dipstick: NEGATIVE
Ketones, ur: NEGATIVE mg/dL
Nitrite: NEGATIVE
Protein, ur: NEGATIVE mg/dL
Specific Gravity, Urine: 1.011 (ref 1.005–1.030)
pH: 7 (ref 5.0–8.0)

## 2019-11-06 NOTE — Progress Notes (Signed)
I connected by phone with Sheila Gardner on 11/06/2019 at 7:09 PM to discuss the potential use of a new treatment for mild to moderate COVID-19 viral infection in non-hospitalized patients.  This patient is a 84 y.o. female that meets the FDA criteria for Emergency Use Authorization of COVID monoclonal antibody casirivimab/imdevimab.  Has a (+) direct SARS-CoV-2 viral test result  Has mild or moderate COVID-19   Is NOT hospitalized due to COVID-19  Is within 10 days of symptom onset (11/04/19)  Has at least one of the high risk factor(s) for progression to severe COVID-19 and/or hospitalization as defined in EUA.  Specific high risk criteria : Cardiovascular disease or hypertension   I have spoken and communicated the following to the patient or parent/caregiver regarding COVID monoclonal antibody treatment:  1. FDA has authorized the emergency use for the treatment of mild to moderate COVID-19 in adults and pediatric patients with positive results of direct SARS-CoV-2 viral testing who are 38 years of age and older weighing at least 40 kg, and who are at high risk for progressing to severe COVID-19 and/or hospitalization.  2. The significant known and potential risks and benefits of COVID monoclonal antibody, and the extent to which such potential risks and benefits are unknown.  3. Information on available alternative treatments and the risks and benefits of those alternatives, including clinical trials.  4. Patients treated with COVID monoclonal antibody should continue to self-isolate and use infection control measures (e.g., wear mask, isolate, social distance, avoid sharing personal items, clean and disinfect "high touch" surfaces, and frequent handwashing) according to CDC guidelines.   5. The patient or parent/caregiver has the option to accept or refuse COVID monoclonal antibody treatment.  After reviewing this information with the patient, The patient agreed to proceed with  receiving casirivimab\imdevimab infusion and will be provided a copy of the Fact sheet prior to receiving the infusion. Sheila Gardner 11/06/2019 7:09 PM

## 2019-11-06 NOTE — ED Triage Notes (Signed)
Pt presents via EMS c/o generalized weakness and nausea. Pt reports Covid + test result today. Pt has hx afib. EMS report bigeminy on monitor. Pt A&Ox4.

## 2019-11-07 ENCOUNTER — Ambulatory Visit (HOSPITAL_COMMUNITY): Payer: Medicare Other

## 2019-11-07 NOTE — ED Notes (Signed)
Pt stated they wanted to leave. This tech removed their IV and assisted them to the telephone.

## 2019-11-08 ENCOUNTER — Ambulatory Visit (HOSPITAL_COMMUNITY): Payer: Medicare Other

## 2019-11-16 ENCOUNTER — Other Ambulatory Visit: Payer: Self-pay

## 2019-11-16 ENCOUNTER — Encounter (HOSPITAL_COMMUNITY): Payer: Self-pay

## 2019-11-16 ENCOUNTER — Ambulatory Visit (HOSPITAL_COMMUNITY)
Admission: EM | Admit: 2019-11-16 | Discharge: 2019-11-16 | Disposition: A | Discharge status: Home / Self Care | Payer: Medicare Other | Attending: Urgent Care | Admitting: Urgent Care

## 2019-11-16 DIAGNOSIS — L089 Local infection of the skin and subcutaneous tissue, unspecified: Secondary | ICD-10-CM | POA: Diagnosis not present

## 2019-11-16 DIAGNOSIS — R109 Unspecified abdominal pain: Secondary | ICD-10-CM | POA: Diagnosis not present

## 2019-11-16 DIAGNOSIS — L723 Sebaceous cyst: Secondary | ICD-10-CM

## 2019-11-16 MED ORDER — CEPHALEXIN 250 MG PO CAPS: 250.0000 mg | ORAL_CAPSULE | Freq: Three times a day (TID) | ORAL | 0 refills | Status: DC

## 2019-11-16 NOTE — Discharge Instructions (Addendum)
Please change your dressing 2-3 times daily. Do not apply any ointments or creams. Each time you change your dressing, make sure that you are pressing on the wound to get pus to come out.  Try your best to have a family member help you clean gently around the perimeter of the wound with gentle soap and warm water. Pat your wound dry and let it air out if possible to make sure it is dry before reapplying another dressing. Make sure you follow up with the dermatologist on Monday.

## 2019-11-16 NOTE — ED Provider Notes (Signed)
[place]   MRN: [id number] DOB: 22-Mar-1935  Subjective:   Sheila Gardner is a 84 y.o. female presenting for longstanding history of cyst under her arm over her left flank side.  Patient states that in the past couple of days she has had more swelling, redness and pain.  She set up a dermatology appointment for Monday but cannot tolerate the pain and wanted to come in for consultation.  No current facility-administered medications for this encounter.  Current Outpatient Medications:  .  acetaminophen (TYLENOL) 650 MG CR tablet, Take 1,300 mg by mouth every 8 (eight) hours as needed for pain., Disp: , Rfl:  .  Ascorbic Acid (VITAMIN C) 1000 MG tablet, Take 1,000 mg by mouth daily. , Disp: , Rfl:  .  BIOTIN PO, Take 10,000 Units by mouth daily with breakfast. , Disp: , Rfl:  .  Cholecalciferol (VITAMIN D-3) 1000 UNITS CAPS, Take 1,000 Units by mouth daily with breakfast. , Disp: , Rfl:  .  cyanocobalamin 1000 MCG tablet, Take 1,000 mcg by mouth daily. , Disp: , Rfl:  .  dofetilide (TIKOSYN) 125 MCG capsule, Take 1 capsule (125 mcg total) by mouth 2 (two) times daily., Disp: 60 capsule, Rfl: 11 .  ELIQUIS 5 MG TABS tablet, Take 1 tablet (5 mg total) by mouth 2 (two) times daily., Disp: 60 tablet, Rfl: 5 .  esomeprazole (NEXIUM) 20 MG capsule, Take 20 mg by mouth daily at 12 noon., Disp: , Rfl:  .  ferrous sulfate 325 (65 FE) MG tablet, Take 325 mg by mouth daily with breakfast., Disp: , Rfl:  .  furosemide (LASIX) 40 MG tablet, Take 1 tablet (40 mg total) by mouth daily., Disp: 90 tablet, Rfl: 3 .  gabapentin (NEURONTIN) 100 MG capsule, Take 100 mg by mouth 2 (two) times daily., Disp: , Rfl:  .  LORazepam (ATIVAN) 1 MG tablet, Take 1 mg by mouth at bedtime. , Disp: , Rfl:  .  losartan (COZAAR) 100 MG tablet, Take 100 mg by mouth daily. , Disp: , Rfl:  .  magnesium oxide (MAG-OX) 400 (241.3 Mg) MG tablet, Take 1 tablet (400 mg total) by mouth daily., Disp: 30 tablet, Rfl:  6 .  Melatonin 10 MG TABS, Take 1 tablet by mouth as needed (sleep)., Disp: , Rfl:  .  potassium chloride (KLOR-CON) 10 MEQ tablet, Take 4 tablets (40 mEq total) by mouth daily., Disp: 120 tablet, Rfl: 6 .  pyridOXINE (VITAMIN B-6) 100 MG tablet, Take 100 mg by mouth daily., Disp: , Rfl:  .  rosuvastatin (CRESTOR) 40 MG tablet, Take 1 tablet (40 mg total) by mouth daily at 6 PM., Disp: 30 tablet, Rfl: 6 .  vitamin E 400 UNIT capsule, Take 400 Units by mouth daily., Disp: , Rfl:    No Known Allergies  Past Medical History:  Diagnosis Date  . Arthritis   . Atrial fibrillation ([place])   . Breast cancer ([place])   . Cancer ([place])    Breast- rt  . Cough with sputum 2016  . Elevated liver function tests 05/09/2013  . GERD (gastroesophageal reflux disease)    COSTOCHONDRITIS  . Heart murmur   . Hip pain 05/08/2014  . Hyperlipidemia   . Hypertension   . Lipoma    back of neck  . PONV (postoperative nausea and vomiting)    only after rt hip surgery  . Primary osteoarthritis of left knee 11/04/2015  . Primary osteoarthritis of right hip 07/30/2014  .  Squamous cell carcinoma of skin 08/21/2019   atypical squa. proliferation-Left corner of mouth     Past Surgical History:  Procedure Laterality Date  . ANKLE FRACTURE SURGERY Right 2013   RIGHT   . BREAST LUMPECTOMY Right 2011  . BREAST SURGERY  04/22/2009   Rt lumpectomy  . CARDIOVERSION N/A 02/19/2019   Procedure: CARDIOVERSION;  Surgeon: Dorothy Spark, MD;  Location: [facility] ENDOSCOPY;  Service: Cardiovascular;  Laterality: N/A;  . CORONARY ATHERECTOMY N/A 04/30/2019   Procedure: CORONARY ATHERECTOMY;  Surgeon: Belva Crome, MD;  Location: [place] CV LAB;  Service: Cardiovascular;  Laterality: N/A;  LAD  . CORONARY STENT INTERVENTION N/A 04/30/2019   Procedure: CORONARY STENT INTERVENTION;  Surgeon: Belva Crome, MD;  Location: [place] CV LAB;  Service: Cardiovascular;  Laterality: N/A;  DES TO PROX-MID LAD  . EYE SURGERY  2001    macular hole  . JOINT REPLACEMENT  2016  . KNEE SURGERY    . LEFT HEART CATH AND CORONARY ANGIOGRAPHY N/A 04/30/2019   Procedure: LEFT HEART CATH AND CORONARY ANGIOGRAPHY;  Surgeon: Belva Crome, MD;  Location: [place] CV LAB;  Service: Cardiovascular;  Laterality: N/A;  . MIDDLE EAR SURGERY Left 08/18/2015   repair eardrum and reconstruction  . OVARY SURGERY  40 years ago   wedge  . TOTAL HIP ARTHROPLASTY Right 07/30/2014   Procedure: TOTAL HIP ARTHROPLASTY ANTERIOR APPROACH;  Surgeon: Melrose Nakayama, MD;  Location: [place] [facility];  Service: Orthopedics;  Laterality: Right;  . TOTAL KNEE ARTHROPLASTY Left 11/04/2015  . TOTAL KNEE ARTHROPLASTY Left 11/04/2015   Procedure: TOTAL KNEE ARTHROPLASTY;  Surgeon: Melrose Nakayama, MD;  Location: [facility];  Service: Orthopedics;  Laterality: Left;  . TYMPANOPLASTY  30 years ago    Family History  Problem Relation Age of Onset  . Heart disease Mother   . Cancer Brother        colon, pancreatic, brain    Social History   Tobacco Use  . Smoking status: Never Smoker  . Smokeless tobacco: Never Used  Vaping Use  . Vaping Use: Never used  Substance Use Topics  . Alcohol use: Not Currently  . Drug use: No    ROS   Objective:   Vitals: BP (!) 166/70 (BP Location: Left Arm)   Pulse (!) 59   Temp 98.2 F (36.8 C) (Oral)   Resp 17   SpO2 97%   Physical Exam Constitutional:      General: She is not in acute distress.    Appearance: Normal appearance. She is well-developed. She is not ill-appearing.  HENT:     Head: Normocephalic and atraumatic.     Nose: Nose normal.     Mouth/Throat:     Mouth: Mucous membranes are moist.     Pharynx: Oropharynx is clear.  Eyes:     General: No scleral icterus.    Extraocular Movements: Extraocular movements intact.     Pupils: Pupils are equal, round, and reactive to light.  Cardiovascular:     Rate and Rhythm: Normal rate.  Pulmonary:     Effort: Pulmonary effort is normal.  Skin:    General:  Skin is warm and dry.       Neurological:     General: No focal deficit present.     Mental Status: She is alert and oriented to person, place, and time.  Psychiatric:        Mood and Affect: Mood normal.  Behavior: Behavior normal.     PROCEDURE NOTE: I&D of Abscess Verbal consent obtained. Local anesthesia with 3cc of 2% lidocaine with epinephrine. Site cleansed with Betadine. Incision of 1cm was made using a 11 blade, discharge of sebaceous material, pus and serosanguineous material.  Cyst dissected with Adson forcep, curved hemostats.  Hemostasis achieved with pressure dressing. Cleansed and dressed.   Assessment and Plan :   PDMP not reviewed this encounter.  1. Infected cyst of skin   2. Left flank pain     Successful removal of an infected sebaceous cyst.  Recommended Keflex 250 mg twice daily.  Use Tylenol for pain control.  Follow-up with dermatologist on Monday. Counseled patient on potential for adverse effects with medications prescribed/recommended today, ER and return-to-clinic precautions discussed, patient verbalized understanding.    Jaynee Eagles, [place] 11/16/19 1652

## 2019-11-16 NOTE — ED Triage Notes (Signed)
Pt presents with abscess under left underarm that she states has been there for years but started becoming inflamed and painful yesterday.

## 2019-11-19 ENCOUNTER — Other Ambulatory Visit: Payer: Self-pay

## 2019-11-19 ENCOUNTER — Ambulatory Visit (INDEPENDENT_AMBULATORY_CARE_PROVIDER_SITE_OTHER): Payer: Medicare Other | Admitting: Dermatology

## 2019-11-19 ENCOUNTER — Encounter: Payer: Self-pay | Admitting: Dermatology

## 2019-11-19 DIAGNOSIS — I2510 Atherosclerotic heart disease of native coronary artery without angina pectoris: Secondary | ICD-10-CM | POA: Diagnosis not present

## 2019-11-19 DIAGNOSIS — L729 Follicular cyst of the skin and subcutaneous tissue, unspecified: Secondary | ICD-10-CM | POA: Diagnosis not present

## 2019-11-19 DIAGNOSIS — I2584 Coronary atherosclerosis due to calcified coronary lesion: Secondary | ICD-10-CM

## 2019-11-26 ENCOUNTER — Other Ambulatory Visit: Payer: Self-pay | Admitting: *Deleted

## 2019-11-26 MED ORDER — ROSUVASTATIN CALCIUM 40 MG PO TABS: 40.0000 mg | ORAL_TABLET | Freq: Every day | ORAL | 6 refills | Status: AC

## 2019-12-05 DIAGNOSIS — H52223 Regular astigmatism, bilateral: Secondary | ICD-10-CM | POA: Diagnosis not present

## 2019-12-05 DIAGNOSIS — H43392 Other vitreous opacities, left eye: Secondary | ICD-10-CM | POA: Diagnosis not present

## 2019-12-05 DIAGNOSIS — H01115 Allergic dermatitis of left lower eyelid: Secondary | ICD-10-CM | POA: Diagnosis not present

## 2019-12-05 DIAGNOSIS — H04122 Dry eye syndrome of left lacrimal gland: Secondary | ICD-10-CM | POA: Diagnosis not present

## 2019-12-05 DIAGNOSIS — H5203 Hypermetropia, bilateral: Secondary | ICD-10-CM | POA: Diagnosis not present

## 2019-12-05 DIAGNOSIS — H524 Presbyopia: Secondary | ICD-10-CM | POA: Diagnosis not present

## 2019-12-06 ENCOUNTER — Other Ambulatory Visit (HOSPITAL_COMMUNITY): Payer: Self-pay | Admitting: Orthopedic Surgery

## 2019-12-06 ENCOUNTER — Other Ambulatory Visit: Payer: Self-pay | Admitting: Orthopedic Surgery

## 2019-12-06 DIAGNOSIS — Z96652 Presence of left artificial knee joint: Secondary | ICD-10-CM | POA: Diagnosis not present

## 2019-12-06 DIAGNOSIS — Z471 Aftercare following joint replacement surgery: Secondary | ICD-10-CM | POA: Diagnosis not present

## 2019-12-10 DIAGNOSIS — M79642 Pain in left hand: Secondary | ICD-10-CM | POA: Diagnosis not present

## 2019-12-10 DIAGNOSIS — M79641 Pain in right hand: Secondary | ICD-10-CM | POA: Diagnosis not present

## 2019-12-10 DIAGNOSIS — M13849 Other specified arthritis, unspecified hand: Secondary | ICD-10-CM | POA: Diagnosis not present

## 2019-12-10 DIAGNOSIS — G5603 Carpal tunnel syndrome, bilateral upper limbs: Secondary | ICD-10-CM | POA: Diagnosis not present

## 2019-12-12 ENCOUNTER — Other Ambulatory Visit: Payer: Self-pay

## 2019-12-12 ENCOUNTER — Encounter (HOSPITAL_COMMUNITY)
Admission: RE | Admit: 2019-12-12 | Discharge: 2019-12-12 | Disposition: A | Discharge status: Home / Self Care | Payer: Medicare Other | Source: Ambulatory Visit | Attending: Orthopedic Surgery | Admitting: Orthopedic Surgery

## 2019-12-12 DIAGNOSIS — M25561 Pain in right knee: Secondary | ICD-10-CM | POA: Diagnosis not present

## 2019-12-12 DIAGNOSIS — Z96652 Presence of left artificial knee joint: Secondary | ICD-10-CM | POA: Diagnosis not present

## 2019-12-12 MED ORDER — TECHNETIUM TC 99M MEDRONATE IV KIT
20.0000 | PACK | Freq: Once | INTRAVENOUS | Status: AC | PRN
  Administered 2019-12-12: 20 via INTRAVENOUS

## 2019-12-13 ENCOUNTER — Telehealth: Payer: Self-pay | Admitting: Interventional Cardiology

## 2019-12-13 NOTE — Telephone Encounter (Signed)
Returned call to Pt.  Advised she must take her Eliquis as prescribed to prevent stroke.  Gave Pt assistance number for Eliquis.  She will call and get assistance program started.  Advised Pt would forward to [place] for further assistance with Eliquis.

## 2019-12-13 NOTE — Telephone Encounter (Signed)
Pt c/o medication issue:  1. Name of Medication: ELIQUIS 5 MG TABS tablet  2. How are you currently taking this medication (dosage and times per day)? As directed  3. Are you having a reaction (difficulty breathing--STAT)? no  4. What is your medication issue? Patient states that is almost time for a refill and it is going to cost her quite a bit. She wants to know if instead of taking this medication 2x daily if she can just cut one pill in half and take it 2x daily. Please advise.

## 2019-12-14 NOTE — Telephone Encounter (Signed)
**Note De-Identified Malayah Demuro Obfuscation** I s/w the pt in detail concerning her applying for pt asst for her Eliquis as she states that she has been in the donut hole since April of this year and has been paying $125 for a 30 day supply and that it was only $30/30 day supply prior to that.  I have advised her to call [employer] at the phone number we provided her with yesterday  to ask them questions concerning their Eliquis program and that if it looks like she would be approved to request that they mail her an application to her home. She is aware to complete her part of the application, obtain required documents per [employer] pt asst foundation, and to bring all to Dr Darliss Ridgel office to drop off and that we will handle the provider part of the application and will fax all to [place].  She verbalized understanding and thanked me for calling her to discuss the [employer] Pt Asst Foundation program with her.

## 2019-12-15 ENCOUNTER — Encounter: Payer: Self-pay | Admitting: Dermatology

## 2019-12-15 NOTE — Progress Notes (Signed)
   Follow-Up Visit   Subjective  Sheila Gardner is a 84 y.o. female who presents for the following: Cyst (left side- went to urgent care- tx  kelfex tid x 7 days).  Cyst Location: Left side Duration:  Quality: Was inflamed now improved Associated Signs/Symptoms: Modifying Factors: Oral cephalexin Severity:  Timing: Context:   Objective  Well appearing patient in no apparent distress; mood and affect are within normal limits.  A focused examination was performed including Abdomen, back, arms, head and neck.. Relevant physical exam findings are noted in the Assessment and Plan.   Assessment & Plan    Cyst of skin Left Flank  Detailed explanation that while epidermoid cyst may inflamed, they are not malignant.  Risks of surgical excision detailed and [name] will think this over.      I, Sheila Monarch, MD, have reviewed all documentation for this visit.  The documentation on 12/15/19 for the exam, diagnosis, procedures, and orders are all accurate and complete.

## 2019-12-17 ENCOUNTER — Telehealth: Payer: Self-pay | Admitting: Interventional Cardiology

## 2019-12-17 NOTE — Telephone Encounter (Signed)
Patient calling the office for samples of medication:   1.  What medication and dosage are you requesting samples for? ELIQUIS 5 MG TABS tablet   2.  Are you currently out of this medication? Yes    

## 2019-12-17 NOTE — Telephone Encounter (Signed)
Follow Up:    Pt wants to know if you had the any samples of Eliquis? She said she need to know asap please. If  we do not have any, she will have to make other arrangements. She really needs her Eliquis.

## 2019-12-18 NOTE — Telephone Encounter (Signed)
Will do. Thanks Jeani Hawking, LPN.

## 2019-12-18 NOTE — Telephone Encounter (Addendum)
**Note De-Identified Sheila Gardner Obfuscation** The pt states that her Eliquis has gone up in cost and that Dr [employer] office gives her samples until the new yesr. I reminded the pt of the multiple conversations we have had concerning her applying for pt asst through [facility] but she never contacted them.  She is advised that we are leaving her 2 boxes of Eliquis 5 mg samples in the front office at Dr Darliss Ridgel office for her to pick up. She is also advised that if she does not apply with [employer] Pt Asst we can no longer help her with samples as the office is low on samples as most of our pts are in their "donut holes" at this time of the year.  She is aware that if she does not apply we will have to discuss her switching to generic Warfarin or to name brand Xarelto as there is a program [place]) where all she would need to do is apply over the phone with them and there are no restrictions or limits but that Xarelto will cost $85/30 day supply through this program but is less expensive than $125 for a 30 day supply of Eliquis.  She does not want to switch to either and states that she will pick up her Eliquis samples up today at the office and that she is calling [employer] today to request they mail her an application.

## 2019-12-18 NOTE — Telephone Encounter (Signed)
Called pt to see if she has requested a patient assistance application, pt states that she has not done so and will try to do it today. Jeani Hawking, LPN, could you please advise on this matter? Thanks

## 2019-12-18 NOTE — Telephone Encounter (Signed)
Patient has received form, she wants to know what is her next step.

## 2019-12-19 NOTE — Telephone Encounter (Signed)
Patient calling to speak with Jeani Hawking.

## 2019-12-19 NOTE — Telephone Encounter (Signed)
**Note De-Identified Starr Urias Obfuscation** No answer so I left a detailed message on the pts VM (Ok per [facility]) asking her to refer to the [employer] Pt Asst application instructions on the first page of the application and/or call [employer] at number listed on the application and I did leave their phone number as well in the VM.  I did advise in the VM that normally they will need her 2020 proof of income and her 2021 out of pocket expense report from her pharmacy and for her to complete her part of the application and to bring all to Dr Thompson Caul office to drop off and that we will take care of Dr Darliss Ridgel part and will fax all to [place].  I did leave my name and the office phone number so she can call me back if she has questions for [place].

## 2019-12-20 NOTE — Telephone Encounter (Signed)
**Note De-Identified Sheila Gardner Obfuscation** The pt states that she called [employer] Pt [employer] and was advised that they are not taking anymore applications(?). I advised her to complete her part of the application, obtain required documents per [employer] Pt Asst [employer], and to bring all to Dr Darliss Ridgel office to drop off and that we will handle the provider part of the application and will fax it to [place].  She verbalized understanding and states she is dropping it off today at Dr Darliss Ridgel office.

## 2019-12-21 NOTE — Telephone Encounter (Signed)
Paperwork completed and faxed to [employer]

## 2019-12-21 NOTE — Telephone Encounter (Addendum)
**Note De-Identified Joffrey Kerce Obfuscation** The pt left her [employer] Pt Asst application (39 pages) at the office. I went through her documentation's and pulled out what [employer] Pt Asst needs which was 13 pages. I do have all 69 pages incase [employer] needs something that I did not fax to them with her application.    I completed the provider page of the application and have emailed it to Dr Darliss Ridgel nurse so she can obtain Dr Darliss Ridgel signature and to fax to [employer] at the fax number written on the cover letter included.

## 2019-12-26 NOTE — Telephone Encounter (Signed)
**Note De-Identified Sheila Gardner Obfuscation** [employer] pt asst faxed [place] a letter stating that the pt did not answer the question of how many people live in her household. I called the pt and she states that she lives alone. I wrote #1 on the application for that question, printed her medication list which includes her allergies, and emailed all to my clinical supervisor and asked that she fax it to [employer] at fax number written on cover letter.

## 2020-01-02 NOTE — Telephone Encounter (Signed)
**Note De-Identified Sheila Gardner Obfuscation** Letter received from [employer] Pt Asst stating that the pt has been approved for assistance with her Eliquis. Approval is valid until 77/41/4239 Application Case#: [id number]  I have notified the pt of this approval and she expressed much gratitude towards Dr [name] and the office for our assistance with this.

## 2020-01-03 ENCOUNTER — Encounter: Payer: Medicare Other | Admitting: Dermatology

## 2020-01-07 ENCOUNTER — Other Ambulatory Visit (HOSPITAL_COMMUNITY): Payer: Self-pay | Admitting: Student

## 2020-01-07 ENCOUNTER — Telehealth: Payer: Self-pay | Admitting: Internal Medicine

## 2020-01-07 NOTE — Telephone Encounter (Signed)
Returned call to pt.  Per Pt she did not feel well Saturday.  Felt like she was going to faint.  She immediately sat down.  States "she breathed through it".  Afterward she felt sweaty.  Still didn't feel well on Sunday.  States she is nauseous today.  She states she has not missed any doses of her dofetilide.  Asked if she took something for nausea, she said she hasn't but she has medication for nausea.    Rescheduled follow up appt with Dr. Lovena Le for this Friday.  Advised to call if she is having any more issues.

## 2020-01-07 NOTE — Telephone Encounter (Signed)
Patient c/o Palpitations:  High priority if patient c/o lightheadedness, shortness of breath, or chest pain  1) How long have you had palpitations/irregular HR/ Afib? Are you having the symptoms now? Started on Saturday  2) Are you currently experiencing lightheadedness, SOB or CP? Lightheaded  3) Do you have a history of afib (atrial fibrillation) or irregular heart rhythm? yes  4) Have you checked your BP or HR? (document readings if available):    Saturday it went as high as 108  5) Are you experiencing any other symptoms? nausea , pt almost fainted twice on Saturday    Pt wanted to see Dr. Lovena Le asap. She felt fine when she woke up this morning but once she started getting ready she started feeling nauseated again.

## 2020-01-09 ENCOUNTER — Ambulatory Visit (HOSPITAL_COMMUNITY)
Admission: EM | Admit: 2020-01-09 | Discharge: 2020-01-09 | Disposition: A | Discharge status: Home / Self Care | Payer: Medicare Other | Attending: Family Medicine | Admitting: Family Medicine

## 2020-01-09 ENCOUNTER — Other Ambulatory Visit: Payer: Self-pay

## 2020-01-09 ENCOUNTER — Encounter (HOSPITAL_COMMUNITY): Payer: Self-pay

## 2020-01-09 DIAGNOSIS — N3090 Cystitis, unspecified without hematuria: Secondary | ICD-10-CM | POA: Diagnosis not present

## 2020-01-09 LAB — POCT URINALYSIS DIPSTICK, ED / UC
Bilirubin Urine: NEGATIVE
Glucose, UA: NEGATIVE mg/dL
Hgb urine dipstick: NEGATIVE
Ketones, ur: NEGATIVE mg/dL
Nitrite: NEGATIVE
Protein, ur: NEGATIVE mg/dL
Specific Gravity, Urine: 1.015 (ref 1.005–1.030)
Urobilinogen, UA: 0.2 mg/dL (ref 0.0–1.0)
pH: 7 (ref 5.0–8.0)

## 2020-01-09 MED ORDER — CEPHALEXIN 500 MG PO CAPS: 500.0000 mg | ORAL_CAPSULE | Freq: Two times a day (BID) | ORAL | 0 refills | Status: DC

## 2020-01-09 NOTE — ED Triage Notes (Addendum)
Pt is here with vaginal discharge(brown, pink) that started today, pt states she has nauseous and dizzy, lower back/flank(right) pain since Saturday too. Pt has not taken any meds to relieve discomfort.

## 2020-01-09 NOTE — Discharge Instructions (Addendum)
We have sent a urine culture and will notify you of any significant changes.

## 2020-01-10 ENCOUNTER — Telehealth: Payer: Self-pay | Admitting: Interventional Cardiology

## 2020-01-10 NOTE — Telephone Encounter (Signed)
Patient calling to speak with you in regards to her Eliquis. She states that on the forms for the assistance it states that the medication will either be delivered to her or Dr. Thompson Caul office. She wants to know which one to expect, can you help her with this?

## 2020-01-11 ENCOUNTER — Other Ambulatory Visit: Payer: Self-pay

## 2020-01-11 ENCOUNTER — Encounter: Payer: Self-pay | Admitting: Internal Medicine

## 2020-01-11 ENCOUNTER — Ambulatory Visit (INDEPENDENT_AMBULATORY_CARE_PROVIDER_SITE_OTHER): Payer: Medicare Other | Admitting: Internal Medicine

## 2020-01-11 VITALS — BP 112/68 | HR 71 | Ht 64.5 in | Wt 183.4 lb

## 2020-01-11 DIAGNOSIS — R55 Syncope and collapse: Secondary | ICD-10-CM | POA: Diagnosis not present

## 2020-01-11 DIAGNOSIS — I2510 Atherosclerotic heart disease of native coronary artery without angina pectoris: Secondary | ICD-10-CM | POA: Diagnosis not present

## 2020-01-11 DIAGNOSIS — I4819 Other persistent atrial fibrillation: Secondary | ICD-10-CM

## 2020-01-11 DIAGNOSIS — I2584 Coronary atherosclerosis due to calcified coronary lesion: Secondary | ICD-10-CM | POA: Diagnosis not present

## 2020-01-11 DIAGNOSIS — R42 Dizziness and giddiness: Secondary | ICD-10-CM | POA: Insufficient documentation

## 2020-01-11 NOTE — Telephone Encounter (Signed)
**Note De-Identified Sheila Gardner Obfuscation** The pt is advised that we/she checked "Ship to Patient" on her application so her Eliquis should be coming to her home. I did give her [employer]'s phone number to call to ask questions concerning her Eiquis shipments going forward. She thanked me for all of my help and states that she will call them now.

## 2020-01-11 NOTE — Progress Notes (Addendum)
HPI Sheila Gardner returns today for ongoing evaluation and management of atrial fibrillation.  She is a very pleasant 84 year old woman with a history of PAF who has been on dofetilide.  She experienced a syncopal episode and her dose of dofetilide was decreased.  Since then she has done well with no palpitations, no chest pain, and minimal shortness of breath.  No peripheral edema. No Known Allergies   Current Outpatient Medications  Medication Sig Dispense Refill  . acetaminophen (TYLENOL) 650 MG CR tablet Take 1,300 mg by mouth every 8 (eight) hours as needed for pain.    . Ascorbic Acid (VITAMIN C) 1000 MG tablet Take 1,000 mg by mouth daily.     Marland Kitchen BIOTIN PO Take 10,000 Units by mouth daily with breakfast.     . cephALEXin (KEFLEX) 500 MG capsule Take 1 capsule (500 mg total) by mouth 2 (two) times daily. 10 capsule 0  . Cholecalciferol (VITAMIN D-3) 1000 UNITS CAPS Take 1,000 Units by mouth daily with breakfast.     . cyanocobalamin 1000 MCG tablet Take 1,000 mcg by mouth daily.     Marland Kitchen dofetilide (TIKOSYN) 125 MCG capsule Take 1 capsule (125 mcg total) by mouth 2 (two) times daily. 60 capsule 11  . ELIQUIS 5 MG TABS tablet Take 1 tablet (5 mg total) by mouth 2 (two) times daily. 60 tablet 5  . esomeprazole (NEXIUM) 20 MG capsule Take 20 mg by mouth daily at 12 noon.    . ferrous sulfate 325 (65 FE) MG tablet Take 325 mg by mouth daily with breakfast.    . gabapentin (NEURONTIN) 100 MG capsule Take 100 mg by mouth 2 (two) times daily.    Marland Kitchen LORazepam (ATIVAN) 1 MG tablet Take 1 mg by mouth at bedtime.     Marland Kitchen losartan (COZAAR) 100 MG tablet Take 100 mg by mouth daily.     . magnesium oxide (MAG-OX) 400 (241.3 Mg) MG tablet Take 1 tablet (400 mg total) by mouth daily. 30 tablet 6  . Melatonin 10 MG TABS Take 1 tablet by mouth as needed (sleep).    . potassium chloride (KLOR-CON) 10 MEQ tablet Take 4 tablets (40 mEq total) by mouth daily. 120 tablet 6  . pyridOXINE (VITAMIN B-6) 100 MG  tablet Take 100 mg by mouth daily.    . rosuvastatin (CRESTOR) 40 MG tablet Take 1 tablet (40 mg total) by mouth daily at 6 PM. 30 tablet 6  . vitamin E 400 UNIT capsule Take 400 Units by mouth daily.    . furosemide (LASIX) 40 MG tablet Take 1 tablet (40 mg total) by mouth daily. 90 tablet 3   No current facility-administered medications for this visit.     Past Medical History:  Diagnosis Date  . Arthritis   . Atrial fibrillation ([place])   . Breast cancer ([place])   . Cancer ([place])    Breast- rt  . Cough with sputum 2016  . Elevated liver function tests 05/09/2013  . GERD (gastroesophageal reflux disease)    COSTOCHONDRITIS  . Heart murmur   . Hip pain 05/08/2014  . Hyperlipidemia   . Hypertension   . Lipoma    back of neck  . PONV (postoperative nausea and vomiting)    only after rt hip surgery  . Primary osteoarthritis of left knee 11/04/2015  . Primary osteoarthritis of right hip 07/30/2014  . Squamous cell carcinoma of skin 08/21/2019   atypical squa. proliferation-Left corner of mouth  ROS:   All systems reviewed and negative except as noted in the HPI.   Past Surgical History:  Procedure Laterality Date  . ANKLE FRACTURE SURGERY Right 2013   RIGHT   . BREAST LUMPECTOMY Right 2011  . BREAST SURGERY  04/22/2009   Rt lumpectomy  . CARDIOVERSION N/A 02/19/2019   Procedure: CARDIOVERSION;  Surgeon: Dorothy Spark, MD;  Location: [facility] ENDOSCOPY;  Service: Cardiovascular;  Laterality: N/A;  . CORONARY ATHERECTOMY N/A 04/30/2019   Procedure: CORONARY ATHERECTOMY;  Surgeon: Belva Crome, MD;  Location: [place] CV LAB;  Service: Cardiovascular;  Laterality: N/A;  LAD  . CORONARY STENT INTERVENTION N/A 04/30/2019   Procedure: CORONARY STENT INTERVENTION;  Surgeon: Belva Crome, MD;  Location: [place] CV LAB;  Service: Cardiovascular;  Laterality: N/A;  DES TO PROX-MID LAD  . EYE SURGERY  2001   macular hole  . JOINT REPLACEMENT  2016  . KNEE SURGERY    . LEFT  HEART CATH AND CORONARY ANGIOGRAPHY N/A 04/30/2019   Procedure: LEFT HEART CATH AND CORONARY ANGIOGRAPHY;  Surgeon: Belva Crome, MD;  Location: [place] CV LAB;  Service: Cardiovascular;  Laterality: N/A;  . MIDDLE EAR SURGERY Left 08/18/2015   repair eardrum and reconstruction  . OVARY SURGERY  40 years ago   wedge  . TOTAL HIP ARTHROPLASTY Right 07/30/2014   Procedure: TOTAL HIP ARTHROPLASTY ANTERIOR APPROACH;  Surgeon: Melrose Nakayama, MD;  Location: [place];  Service: Orthopedics;  Laterality: Right;  . TOTAL KNEE ARTHROPLASTY Left 11/04/2015  . TOTAL KNEE ARTHROPLASTY Left 11/04/2015   Procedure: TOTAL KNEE ARTHROPLASTY;  Surgeon: Melrose Nakayama, MD;  Location: [place];  Service: Orthopedics;  Laterality: Left;  . TYMPANOPLASTY  30 years ago     Family History  Problem Relation Age of Onset  . Heart disease Mother   . Cancer Brother        colon, pancreatic, brain     Social History   Socioeconomic History  . Marital status: Widowed    Spouse name: Not on file  . Number of children: Not on file  . Years of education: Not on file  . Highest education level: Not on file  Occupational History  . Not on file  Tobacco Use  . Smoking status: Never Smoker  . Smokeless tobacco: Never Used  Vaping Use  . Vaping Use: Never used  Substance and Sexual Activity  . Alcohol use: Not Currently  . Drug use: No  . Sexual activity: Not Currently    Birth control/protection: None  Other Topics Concern  . Not on file  Social History Narrative  . Not on file   Social Determinants of Health   Financial Resource Strain:   . Difficulty of Paying Living Expenses: Not on file  Food Insecurity:   . Worried About Charity fundraiser in the Last Year: Not on file  . Ran Out of Food in the Last Year: Not on file  Transportation Needs:   . Lack of Transportation (Medical): Not on file  . Lack of Transportation (Non-Medical): Not on file  Physical Activity:   . Days of Exercise per Week:  Not on file  . Minutes of Exercise per Session: Not on file  Stress:   . Feeling of Stress : Not on file  Social Connections:   . Frequency of Communication with Friends and Family: Not on file  . Frequency of Social Gatherings with Friends and Family: Not on file  . Attends  Religious Services: Not on file  . Active Member of Clubs or Organizations: Not on file  . Attends Archivist Meetings: Not on file  . Marital Status: Not on file  Intimate Partner Violence:   . Fear of Current or Ex-Partner: Not on file  . Emotionally Abused: Not on file  . Physically Abused: Not on file  . Sexually Abused: Not on file     BP 112/68   Pulse 71   Ht 5' 4.5" (1.638 m)   Wt 183 lb 6.4 oz (83.2 kg)   SpO2 95%   BMI 30.99 kg/m   Physical Exam:  Well appearing 84 year old woman, NAD HEENT: Unremarkable Neck: 6 cm JVD, no thyromegally Lymphatics:  No adenopathy Back:  No CVA tenderness Lungs:  Clear, with no wheezes, rales, or rhonchi HEART:  Regular rate rhythm, no murmurs, no rubs, no clicks Abd:  soft, positive bowel sounds, no organomegally, no rebound, no guarding Ext:  2 plus pulses, no edema, no cyanosis, no clubbing Skin:  No rashes no nodules Neuro:  CN II through XII intact, motor grossly intact  EKG -normal sinus rhythm with right bundle branch block and left axis deviation.  First-degree AV  block is present; Corrected QT is 460  Assess/Plan: 1.  Paroxysmal atrial fibrillation -she is maintaining sinus rhythm on dofetilide.  She will continue her current dose.  Her twelve-lead EKG demonstrates an acceptable QT interval. 2.  Syncope -she has had no recurrent symptoms. I discussed insertion of an ILR for syncope and atrial fib monitoring and she will call us if she wishes to proceed. 3.  Dyspnea -she is improved.  She will continue low-dose Lasix. 4.  Systemic anticoagulation -she has had no bleeding on Eliquis.  She will continue her current medication.  Sheila Gardner  Sheila Longshore,MD

## 2020-01-11 NOTE — Patient Instructions (Addendum)
Medication Instructions:  Your physician recommends that you continue on your current medications as directed. Please refer to the Current Medication list given to you today.  Labwork: None ordered.  Testing/Procedures: None ordered.  Follow-Up:  January 15, 2020 at 9:45 am at the [facility] office  Any Other Special Instructions Will Be Listed Below (If Applicable).  If you need a refill on your cardiac medications before your next appointment, please call your pharmacy.   Implantable loop recorder

## 2020-01-12 NOTE — ED Provider Notes (Signed)
[place]    ASSESSMENT & PLAN:  1. Cystitis     Begin: Meds ordered this encounter  Medications  . cephALEXin (KEFLEX) 500 MG capsule    Sig: Take 1 capsule (500 mg total) by mouth 2 (two) times daily.    Dispense:  10 capsule    Refill:  0    Stressed importance of f/u if she sees bleeding from her vagina. Exam deferred this evening.  No signs of pyelonephritis. Discussed. Urine culture sent. Will notify patient of any significant results. Will follow up with her PCP or here if not showing improvement over the next 48 hours, sooner if needed.  Outlined signs and symptoms indicating need for more acute intervention. Patient verbalized understanding. After Visit Summary given.  SUBJECTIVE:  Sheila Gardner is a 84 y.o. female who complains of mild dysuria. Feels she may have seen blood; "looked pink". Questions if from vagina. No h/o post-menopausal vaginal bleeding.  Mild R flank discomfort. Afebrile. No n/v/d. No specific abd pain.  No LE edema. Normal PO intake. Without specific abdominal pain. Ambulatory without difficulty.  LMP: No LMP recorded. Patient is postmenopausal.   OBJECTIVE:  Vitals:   01/09/20 1733  BP: (!) 136/51  Pulse: 66  Resp: 16  Temp: (!) 97.5 F (36.4 C)  TempSrc: Oral  SpO2: 100%   General appearance: alert; no distress HENT: oropharynx: moist Lungs: unlabored respirations Abdomen: soft, non-tender; bowel sounds normal; no masses or organomegaly; no guarding or rebound tenderness Back: no CVA tenderness Extremities: no edema; symmetrical with no gross deformities Skin: warm and dry Neurologic: normal gait Psychological: alert and cooperative; normal mood and affect  Labs Reviewed  POCT URINALYSIS DIPSTICK, ED / UC - Abnormal; Notable for the following components:      Result Value   Leukocytes,Ua MODERATE (*)    All other components within normal limits    No Known Allergies  Past Medical History:  Diagnosis Date    . Arthritis   . Atrial fibrillation ([place])   . Breast cancer ([place])   . Cancer ([place])    Breast- rt  . Cough with sputum 2016  . Elevated liver function tests 05/09/2013  . GERD (gastroesophageal reflux disease)    COSTOCHONDRITIS  . Heart murmur   . Hip pain 05/08/2014  . Hyperlipidemia   . Hypertension   . Lipoma    back of neck  . PONV (postoperative nausea and vomiting)    only after rt hip surgery  . Primary osteoarthritis of left knee 11/04/2015  . Primary osteoarthritis of right hip 07/30/2014  . Squamous cell carcinoma of skin 08/21/2019   atypical squa. proliferation-Left corner of mouth   Social History   Socioeconomic History  . Marital status: Widowed    Spouse name: Not on file  . Number of children: Not on file  . Years of education: Not on file  . Highest education level: Not on file  Occupational History  . Not on file  Tobacco Use  . Smoking status: Never Smoker  . Smokeless tobacco: Never Used  Vaping Use  . Vaping Use: Never used  Substance and Sexual Activity  . Alcohol use: Not Currently  . Drug use: No  . Sexual activity: Not Currently    Birth control/protection: None  Other Topics Concern  . Not on file  Social History Narrative  . Not on file   Social Determinants of Health   Financial Resource Strain:   . Difficulty of Paying  Living Expenses: Not on file  Food Insecurity:   . Worried About Charity fundraiser in the Last Year: Not on file  . Ran Out of Food in the Last Year: Not on file  Transportation Needs:   . Lack of Transportation (Medical): Not on file  . Lack of Transportation (Non-Medical): Not on file  Physical Activity:   . Days of Exercise per Week: Not on file  . Minutes of Exercise per Session: Not on file  Stress:   . Feeling of Stress : Not on file  Social Connections:   . Frequency of Communication with Friends and Family: Not on file  . Frequency of Social Gatherings with Friends and Family: Not on file  . Attends  Religious Services: Not on file  . Active Member of Clubs or Organizations: Not on file  . Attends Archivist Meetings: Not on file  . Marital Status: Not on file  Intimate Partner Violence:   . Fear of Current or Ex-Partner: Not on file  . Emotionally Abused: Not on file  . Physically Abused: Not on file  . Sexually Abused: Not on file   Family History  Problem Relation Age of Onset  . Heart disease Mother   . Cancer Brother        colon, pancreatic, brain       Vanessa Kick, MD 01/12/20 1012

## 2020-01-15 ENCOUNTER — Encounter: Payer: Self-pay | Admitting: Internal Medicine

## 2020-01-15 ENCOUNTER — Other Ambulatory Visit: Payer: Self-pay

## 2020-01-15 ENCOUNTER — Ambulatory Visit: Payer: Medicare Other | Admitting: Internal Medicine

## 2020-01-15 ENCOUNTER — Ambulatory Visit (INDEPENDENT_AMBULATORY_CARE_PROVIDER_SITE_OTHER): Payer: Medicare Other | Admitting: Internal Medicine

## 2020-01-15 VITALS — BP 128/62 | HR 70 | Ht 64.5 in | Wt 182.0 lb

## 2020-01-15 DIAGNOSIS — I4819 Other persistent atrial fibrillation: Secondary | ICD-10-CM | POA: Diagnosis not present

## 2020-01-15 DIAGNOSIS — I2584 Coronary atherosclerosis due to calcified coronary lesion: Secondary | ICD-10-CM

## 2020-01-15 DIAGNOSIS — I2510 Atherosclerotic heart disease of native coronary artery without angina pectoris: Secondary | ICD-10-CM | POA: Diagnosis not present

## 2020-01-15 DIAGNOSIS — R55 Syncope and collapse: Secondary | ICD-10-CM

## 2020-01-15 DIAGNOSIS — H838X3 Other specified diseases of inner ear, bilateral: Secondary | ICD-10-CM | POA: Diagnosis not present

## 2020-01-15 DIAGNOSIS — R42 Dizziness and giddiness: Secondary | ICD-10-CM | POA: Diagnosis not present

## 2020-01-15 DIAGNOSIS — H906 Mixed conductive and sensorineural hearing loss, bilateral: Secondary | ICD-10-CM | POA: Diagnosis not present

## 2020-01-15 NOTE — Progress Notes (Signed)
HPI Sheila Gardner returns today for ongoing evaluation of syncope and PAF on dofetilide. The etiology of her syncope is unclear. We had discussed ILR insertion and she presents to day to have this performed. No Known Allergies   Current Outpatient Medications  Medication Sig Dispense Refill  . acetaminophen (TYLENOL) 650 MG CR tablet Take 1,300 mg by mouth every 8 (eight) hours as needed for pain.    . Ascorbic Acid (VITAMIN C) 1000 MG tablet Take 1,000 mg by mouth daily.     Marland Kitchen BIOTIN PO Take 10,000 Units by mouth daily with breakfast.     . cephALEXin (KEFLEX) 500 MG capsule Take 1 capsule (500 mg total) by mouth 2 (two) times daily. 10 capsule 0  . Cholecalciferol (VITAMIN D-3) 1000 UNITS CAPS Take 1,000 Units by mouth daily with breakfast.     . cyanocobalamin 1000 MCG tablet Take 1,000 mcg by mouth daily.     Marland Kitchen dofetilide (TIKOSYN) 125 MCG capsule Take 1 capsule (125 mcg total) by mouth 2 (two) times daily. 60 capsule 11  . ELIQUIS 5 MG TABS tablet Take 1 tablet (5 mg total) by mouth 2 (two) times daily. 60 tablet 5  . esomeprazole (NEXIUM) 20 MG capsule Take 20 mg by mouth daily at 12 noon.    . ferrous sulfate 325 (65 FE) MG tablet Take 325 mg by mouth daily with breakfast.    . gabapentin (NEURONTIN) 100 MG capsule Take 100 mg by mouth 2 (two) times daily.    Marland Kitchen LORazepam (ATIVAN) 1 MG tablet Take 1 mg by mouth at bedtime.     Marland Kitchen losartan (COZAAR) 100 MG tablet Take 100 mg by mouth daily.     . magnesium oxide (MAG-OX) 400 (241.3 Mg) MG tablet Take 1 tablet (400 mg total) by mouth daily. 30 tablet 6  . Melatonin 10 MG TABS Take 1 tablet by mouth as needed (sleep).    . potassium chloride (KLOR-CON) 10 MEQ tablet Take 4 tablets (40 mEq total) by mouth daily. 120 tablet 6  . pyridOXINE (VITAMIN B-6) 100 MG tablet Take 100 mg by mouth daily.    . rosuvastatin (CRESTOR) 40 MG tablet Take 1 tablet (40 mg total) by mouth daily at 6 PM. 30 tablet 6  . vitamin E 400 UNIT capsule Take  400 Units by mouth daily.    . furosemide (LASIX) 40 MG tablet Take 1 tablet (40 mg total) by mouth daily. 90 tablet 3   No current facility-administered medications for this visit.     Past Medical History:  Diagnosis Date  . Arthritis   . Atrial fibrillation ([place])   . Breast cancer ([place])   . Cancer ([place])    Breast- rt  . Cough with sputum 2016  . Elevated liver function tests 05/09/2013  . GERD (gastroesophageal reflux disease)    COSTOCHONDRITIS  . Heart murmur   . Hip pain 05/08/2014  . Hyperlipidemia   . Hypertension   . Lipoma    back of neck  . PONV (postoperative nausea and vomiting)    only after rt hip surgery  . Primary osteoarthritis of left knee 11/04/2015  . Primary osteoarthritis of right hip 07/30/2014  . Squamous cell carcinoma of skin 08/21/2019   atypical squa. proliferation-Left corner of mouth    ROS:   All systems reviewed and negative except as noted in the HPI.   Past Surgical History:  Procedure Laterality Date  . ANKLE FRACTURE SURGERY Right  2013   RIGHT   . BREAST LUMPECTOMY Right 2011  . BREAST SURGERY  04/22/2009   Rt lumpectomy  . CARDIOVERSION N/A 02/19/2019   Procedure: CARDIOVERSION;  Surgeon: Dorothy Spark, MD;  Location: [facility] ENDOSCOPY;  Service: Cardiovascular;  Laterality: N/A;  . CORONARY ATHERECTOMY N/A 04/30/2019   Procedure: CORONARY ATHERECTOMY;  Surgeon: Belva Crome, MD;  Location: [place] CV LAB;  Service: Cardiovascular;  Laterality: N/A;  LAD  . CORONARY STENT INTERVENTION N/A 04/30/2019   Procedure: CORONARY STENT INTERVENTION;  Surgeon: Belva Crome, MD;  Location: [place] CV LAB;  Service: Cardiovascular;  Laterality: N/A;  DES TO PROX-MID LAD  . EYE SURGERY  2001   macular hole  . JOINT REPLACEMENT  2016  . KNEE SURGERY    . LEFT HEART CATH AND CORONARY ANGIOGRAPHY N/A 04/30/2019   Procedure: LEFT HEART CATH AND CORONARY ANGIOGRAPHY;  Surgeon: Belva Crome, MD;  Location: [place] CV LAB;  Service:  Cardiovascular;  Laterality: N/A;  . MIDDLE EAR SURGERY Left 08/18/2015   repair eardrum and reconstruction  . OVARY SURGERY  40 years ago   wedge  . TOTAL HIP ARTHROPLASTY Right 07/30/2014   Procedure: TOTAL HIP ARTHROPLASTY ANTERIOR APPROACH;  Surgeon: Melrose Nakayama, MD;  Location: [employer];  Service: Orthopedics;  Laterality: Right;  . TOTAL KNEE ARTHROPLASTY Left 11/04/2015  . TOTAL KNEE ARTHROPLASTY Left 11/04/2015   Procedure: TOTAL KNEE ARTHROPLASTY;  Surgeon: Melrose Nakayama, MD;  Location: [place];  Service: Orthopedics;  Laterality: Left;  . TYMPANOPLASTY  30 years ago     Family History  Problem Relation Age of Onset  . Heart disease Mother   . Cancer Brother        colon, pancreatic, brain     Social History   Socioeconomic History  . Marital status: Widowed    Spouse name: Not on file  . Number of children: Not on file  . Years of education: Not on file  . Highest education level: Not on file  Occupational History  . Not on file  Tobacco Use  . Smoking status: Never Smoker  . Smokeless tobacco: Never Used  Vaping Use  . Vaping Use: Never used  Substance and Sexual Activity  . Alcohol use: Not Currently  . Drug use: No  . Sexual activity: Not Currently    Birth control/protection: None  Other Topics Concern  . Not on file  Social History Narrative  . Not on file   Social Determinants of Health   Financial Resource Strain:   . Difficulty of Paying Living Expenses: Not on file  Food Insecurity:   . Worried About Charity fundraiser in the Last Year: Not on file  . Ran Out of Food in the Last Year: Not on file  Transportation Needs:   . Lack of Transportation (Medical): Not on file  . Lack of Transportation (Non-Medical): Not on file  Physical Activity:   . Days of Exercise per Week: Not on file  . Minutes of Exercise per Session: Not on file  Stress:   . Feeling of Stress : Not on file  Social Connections:   . Frequency of Communication with Friends  and Family: Not on file  . Frequency of Social Gatherings with Friends and Family: Not on file  . Attends Religious Services: Not on file  . Active Member of Clubs or Organizations: Not on file  . Attends Archivist Meetings: Not on file  . Marital  Status: Not on file  Intimate Partner Violence:   . Fear of Current or Ex-Partner: Not on file  . Emotionally Abused: Not on file  . Physically Abused: Not on file  . Sexually Abused: Not on file     BP 128/62   Pulse 70   Ht 5' 4.5" (1.638 m)   Wt 182 lb (82.6 kg)   SpO2 98%   BMI 30.76 kg/m   Physical Exam:  Well appearing NAD HEENT: Unremarkable Neck:  No JVD, no thyromegally Lymphatics:  No adenopathy Back:  No CVA tenderness Lungs:  Clear with no wheezes HEART:  Regular rate rhythm, no murmurs, no rubs, no clicks Abd:  soft, positive bowel sounds, no organomegally, no rebound, no guarding Ext:  2 plus pulses, no edema, no cyanosis, no clubbing Skin:  No rashes no nodules Neuro:  CN II through XII intact, motor grossly intact   EP procedure Note  Procedure performed: ILR insertion  Preoperative diagnosis: unexplained syncope and atrial fib monitoring  Postoperative diagnosis: same as preoperative diagnosis.  Description of the procedure: After informed consent was obtained, the patient was prepped and draped in a sterile manner.  10 cc of lidocaine was infiltrated into the left pectoral region.  A 1 cm stab incision was carried out.  The Medtronic implantable loop recorder, serial number [id number], was inserted under the skin.  R waves measured 0.5 mV.  Pressure was held.  Hemostasis achieved.  Benzoin and Steri-Strips were painted on the skin.  A bandage was applied.  Patient was recovered in the usual manner.  Complications: There were no immediate procedural complications  Conclusion: Successful insertion of a Medtronic implantable loop recorder in a patient with unexplained syncope as well as paroxysmal  atrial fibrillation.  Cristopher Peru, MD

## 2020-01-15 NOTE — Patient Instructions (Addendum)
Medication Instructions:  Your physician recommends that you continue on your current medications as directed. Please refer to the Current Medication list given to you today.  Labwork: None ordered.  Testing/Procedures: None ordered.  Follow-Up:  Your physician wants you to follow-up in: 6 months with Dr. Lovena Le.   You will receive a reminder letter in the mail two months in advance. If you don't receive a letter, please call our office to schedule the follow-up appointment.      Implantable Loop Recorder Placement, Care After This sheet gives you information about how to care for yourself after your procedure. Your health care provider may also give you more specific instructions. If you have problems or questions, contact your health care provider. What can I expect after the procedure? After the procedure, it is common to have:  Soreness or discomfort near the incision.  Some swelling or bruising near the incision.  Follow these instructions at home: Incision care  1.  Leave your outer dressing on for 72 hours.  After 72 hours you can remove your outer dressing and shower. 2. Leave adhesive strips in place. These skin closures may need to stay in place for 1-2 weeks. If adhesive strip edges start to loosen and curl up, you may trim the loose edges.  You may remove the strips if they have not fallen off after 2 weeks. 3. Check your incision area every day for signs of infection. Check for: a. Redness, swelling, or pain. b. Fluid or blood. c. Warmth. d. Pus or a bad smell. 4. Do not take baths, swim, or use a hot tub until your incision is completely healed. 5. If your wound site starts to bleed apply pressure.      If you have any questions/concerns please call the device clinic at [phone].  Activity  Return to your normal activities.  General instructions  Follow instructions from your health care provider about how to manage your implantable loop recorder and  transmit the information. Learn how to activate a recording if this is necessary for your type of device.  Do not go through a metal detection gate, and do not let someone hold a metal detector over your chest. Show your ID card.  Do not have an MRI unless you check with your health care provider first.  Take over-the-counter and prescription medicines only as told by your health care provider.  Keep all follow-up visits as told by your health care provider. This is important. Contact a health care provider if:  You have redness, swelling, or pain around your incision.  You have a fever.  You have pain that is not relieved by your pain medicine.  You have triggered your device because of fainting (syncope) or because of a heartbeat that feels like it is racing, slow, fluttering, or skipping (palpitations). Get help right away if you have:  Chest pain.  Difficulty breathing. Summary  After the procedure, it is common to have soreness or discomfort near the incision.  Change your dressing as told by your health care provider.  Follow instructions from your health care provider about how to manage your implantable loop recorder and transmit the information.  Keep all follow-up visits as told by your health care provider. This is important. This information is not intended to replace advice given to you by your health care provider. Make sure you discuss any questions you have with your health care provider. Document Released: 02/03/2015 Document Revised: 04/09/2017 Document Reviewed: 04/09/2017 Elsevier Patient  Education  [employer].

## 2020-01-16 ENCOUNTER — Telehealth: Payer: Self-pay | Admitting: Internal Medicine

## 2020-01-16 ENCOUNTER — Other Ambulatory Visit: Payer: Self-pay | Admitting: Otolaryngology

## 2020-01-16 DIAGNOSIS — H9190 Unspecified hearing loss, unspecified ear: Secondary | ICD-10-CM

## 2020-01-16 DIAGNOSIS — H9202 Otalgia, left ear: Secondary | ICD-10-CM

## 2020-01-16 MED ORDER — MAGNESIUM OXIDE 400 (241.3 MG) MG PO TABS
400.0000 mg | ORAL_TABLET | Freq: Every day | ORAL | 3 refills | Status: DC
Start: 2020-01-16 — End: 2021-01-21

## 2020-01-16 NOTE — Telephone Encounter (Signed)
Medication refilled as requested.

## 2020-01-16 NOTE — Telephone Encounter (Signed)
   *  STAT* If patient is at the pharmacy, call can be transferred to refill team.   1. Which medications need to be refilled? (please list name of each medication and dose if known) magnesium oxide (MAG-OX) 400 (241.3 Mg) MG tablet  2. Which pharmacy/location (including street and city if local pharmacy) is medication to be sent to? [place], [place]  3. Do they need a 30 day or 90 day supply? 90 days   Pt  Only have 3 tablet left

## 2020-01-16 NOTE — Telephone Encounter (Signed)
Pt requesting a refill on magnesium oxide. Would Dr. Lovena Le like to refill this medication? Please address

## 2020-01-18 ENCOUNTER — Telehealth: Payer: Self-pay | Admitting: Internal Medicine

## 2020-01-18 NOTE — Telephone Encounter (Signed)
Spoke with pt, confirmed that her remote monitor is connected and we are receiving data.  At this time no arrythmias have been recorded.  Pt questioning how we will know if she has a syncopal episode?  Advised if an arrythmia occurs during episode it will be recorded otherwise she woul;d need a symptom activator.  She reports she was not given one.  Advised I would send a message to Dr. Lovena Le to ask if she should have a symptom activator button for ILR.  Pt implanted due to syncope.

## 2020-01-18 NOTE — Telephone Encounter (Signed)
Pt called in and stated she just had a looper recorder put in and she is still having some soreness.  She would like to speak with someone about her monitor and make sure is doing everything correctly .    Best number [phone]

## 2020-01-22 ENCOUNTER — Telehealth: Payer: Self-pay | Admitting: Interventional Cardiology

## 2020-01-22 ENCOUNTER — Telehealth: Payer: Self-pay | Admitting: Internal Medicine

## 2020-01-22 NOTE — Telephone Encounter (Signed)
I have sent a letter and a symptom activator to patients home

## 2020-01-22 NOTE — Telephone Encounter (Signed)
Patient called and said she did not get a handheld device to use to send a transmission. Patient would like one sent to her. Address in chart is correct

## 2020-01-22 NOTE — Telephone Encounter (Signed)
Pt c/o medication issue:  1. Name of Medication: ELIQUIS 5 MG TABS tablet  2. How are you currently taking this medication (dosage and times per day)? 1 tablet by mouth two times daily   3. Are you having a reaction (difficulty breathing--STAT)? No   4. What is your medication issue? Lizandra is calling stating she only has a 10 day supply left of this medication. She is requesting to speak with Jeani Hawking in regards to this due to calling the number Jeani Hawking gave her for her Eliquis and being confused by the information she was given. Please advise.

## 2020-01-23 NOTE — Telephone Encounter (Addendum)
**Note De-Identified Sheila Gardner Obfuscation** The pt states that she called [facility] and was advised that we need to call them at [phone] to answer questions concerning her Eliquis shippment.  I called [facility] and s/w [place] who advised me that since they just s/w the pt this morning that it will take time for her RX to process (24 to 72 hours) as they have not received her prescription from [employer] due to not being able to s/w the pt until today . While we were speaking [place] stated that the pts prescription for Eliquis was sent to them from [facility] and that since I was on the line with her she would make the RX STAT and that they will overnight ship the pts Eliquis out today and she should expect to receive it tomorrow.  I did call the pt to make her aware of this and she expressed much gratitude for my help.

## 2020-01-23 NOTE — Telephone Encounter (Signed)
Patient calling back.   °

## 2020-01-23 NOTE — Telephone Encounter (Signed)
**Note De-Identified Sheila Gardner Obfuscation** I called [employer] and s/w Sheila Gardner who advised me that the pt was approved for pt asst for Sheila Gardner on 10/27 but that their pharmacy Sheila Gardner) has attempted to reach the pt [name] phone multiple times and have left messages asking the pt to call them back but that she has not. Per [name] needs to s/w the pt to verify required information before shipping her Sheila Gardner to her home.  I called the pt back and explained all of the above. I gave her [facility]'s phone number [phone] opt [phone] so she can call and take care of this and they will ship her Sheila Gardner to her.  She repeated the phone number back to me and states that she is calling them today. She thanked me for calling her back.

## 2020-01-29 DIAGNOSIS — S00412A Abrasion of left ear, initial encounter: Secondary | ICD-10-CM | POA: Diagnosis not present

## 2020-02-07 ENCOUNTER — Ambulatory Visit
Admission: RE | Admit: 2020-02-07 | Discharge: 2020-02-07 | Disposition: A | Discharge status: Home / Self Care | Payer: Medicare Other | Source: Ambulatory Visit | Attending: Otolaryngology | Admitting: Otolaryngology

## 2020-02-07 ENCOUNTER — Other Ambulatory Visit: Payer: Self-pay

## 2020-02-07 DIAGNOSIS — H9202 Otalgia, left ear: Secondary | ICD-10-CM

## 2020-02-07 DIAGNOSIS — Z853 Personal history of malignant neoplasm of breast: Secondary | ICD-10-CM | POA: Diagnosis not present

## 2020-02-07 DIAGNOSIS — H9193 Unspecified hearing loss, bilateral: Secondary | ICD-10-CM | POA: Diagnosis not present

## 2020-02-07 DIAGNOSIS — H9190 Unspecified hearing loss, unspecified ear: Secondary | ICD-10-CM

## 2020-02-07 DIAGNOSIS — J3489 Other specified disorders of nose and nasal sinuses: Secondary | ICD-10-CM | POA: Diagnosis not present

## 2020-02-07 DIAGNOSIS — R600 Localized edema: Secondary | ICD-10-CM | POA: Diagnosis not present

## 2020-02-11 ENCOUNTER — Telehealth: Payer: Self-pay | Admitting: Interventional Cardiology

## 2020-02-11 NOTE — Telephone Encounter (Signed)
Spoke with pt and made her aware that it is ok to use Tylenol Arthritis.  Pt appreciative for call.

## 2020-02-11 NOTE — Telephone Encounter (Signed)
New Message:    Pt wants to know if it is alright for her to take Tylenol 8 hrs Arthritis Pain medicine along with her Eliquis?

## 2020-02-11 NOTE — Telephone Encounter (Signed)
Ok to use Tylenol with her Eliquis, no issue with her other meds.

## 2020-02-11 NOTE — Telephone Encounter (Addendum)
Note from scheduler is showing up as incomplete.  Pt is asking if it is alright to use Tylenol Arthritis pain medicine with her Eliquis?

## 2020-02-12 DIAGNOSIS — R42 Dizziness and giddiness: Secondary | ICD-10-CM | POA: Diagnosis not present

## 2020-02-12 DIAGNOSIS — H906 Mixed conductive and sensorineural hearing loss, bilateral: Secondary | ICD-10-CM | POA: Diagnosis not present

## 2020-02-18 ENCOUNTER — Ambulatory Visit (INDEPENDENT_AMBULATORY_CARE_PROVIDER_SITE_OTHER): Payer: Medicare Other

## 2020-02-18 DIAGNOSIS — R55 Syncope and collapse: Secondary | ICD-10-CM | POA: Diagnosis not present

## 2020-02-18 DIAGNOSIS — I4819 Other persistent atrial fibrillation: Secondary | ICD-10-CM

## 2020-02-18 LAB — CUP PACEART REMOTE DEVICE CHECK
Date Time Interrogation Session: 20211212183351
Implantable Pulse Generator Implant Date: 20211109

## 2020-02-23 ENCOUNTER — Encounter (HOSPITAL_COMMUNITY): Payer: Self-pay

## 2020-02-23 ENCOUNTER — Other Ambulatory Visit: Payer: Self-pay

## 2020-02-23 ENCOUNTER — Ambulatory Visit (HOSPITAL_COMMUNITY)
Admission: EM | Admit: 2020-02-23 | Discharge: 2020-02-23 | Disposition: A | Discharge status: Home / Self Care | Payer: Medicare Other | Attending: Physician Assistant | Admitting: Physician Assistant

## 2020-02-23 DIAGNOSIS — S39012A Strain of muscle, fascia and tendon of lower back, initial encounter: Secondary | ICD-10-CM | POA: Diagnosis not present

## 2020-02-23 LAB — POCT URINALYSIS DIPSTICK, ED / UC
Bilirubin Urine: NEGATIVE
Glucose, UA: NEGATIVE mg/dL
Hgb urine dipstick: NEGATIVE
Ketones, ur: NEGATIVE mg/dL
Leukocytes,Ua: NEGATIVE
Nitrite: NEGATIVE
Protein, ur: NEGATIVE mg/dL
Specific Gravity, Urine: 1.015 (ref 1.005–1.030)
Urobilinogen, UA: 0.2 mg/dL (ref 0.0–1.0)
pH: 6 (ref 5.0–8.0)

## 2020-02-23 NOTE — ED Provider Notes (Signed)
[place]    CSN: [id number] Arrival date & time: 02/23/20  1006      History   Chief Complaint Chief Complaint  Patient presents with  . Back Pain  . Urinary Frequency    HPI Sheila Gardner is a 84 y.o. female.   Pt complains of left lower back pain that started several days ago, worse with movement.  She denies injury or trauma.  Reports she did do heavy cleaning a few days ago.  Reports increased urinary frequency for the last three days and chills that started this morning. Denies dysuria, hematuria, abdominal pain, fever.  Denies radiation of pain, numbness, tingling. She has taken Tylenol with some improvement.  She reports experiencing similar pain in the past that resolved with muscle relaxer.      Past Medical History:  Diagnosis Date  . Arthritis   . Atrial fibrillation ([place])   . Breast cancer ([place])   . Cancer ([place])    Breast- rt  . Cough with sputum 2016  . Elevated liver function tests 05/09/2013  . GERD (gastroesophageal reflux disease)    COSTOCHONDRITIS  . Heart murmur   . Hip pain 05/08/2014  . Hyperlipidemia   . Hypertension   . Lipoma    back of neck  . PONV (postoperative nausea and vomiting)    only after rt hip surgery  . Primary osteoarthritis of left knee 11/04/2015  . Primary osteoarthritis of right hip 07/30/2014  . Squamous cell carcinoma of skin 08/21/2019   atypical squa. proliferation-Left corner of mouth    Patient Active Problem List   Diagnosis Date Noted  . Dizziness 01/11/2020  . Syncope 09/19/2019  . Secondary hypercoagulable state ([place]) 06/15/2019  . Persistent atrial fibrillation ([place]) 06/11/2019  . CAD (coronary artery disease) 04/30/2019  . CAD (coronary artery disease), native coronary artery 04/28/2019  . Hyperlipidemia LDL goal <70 04/28/2019  . Hypertension 04/28/2019  . Primary osteoarthritis of left knee 11/04/2015  . Primary osteoarthritis of right hip 07/30/2014  . Hip pain 05/08/2014  . Elevated  liver function tests 05/09/2013  . Vitamin D deficiency 03/25/2011  . Malignant neoplasm of upper-outer quadrant of right breast in female, estrogen receptor positive ([place]) 12/30/2010    Past Surgical History:  Procedure Laterality Date  . ANKLE FRACTURE SURGERY Right 2013   RIGHT   . BREAST LUMPECTOMY Right 2011  . BREAST SURGERY  04/22/2009   Rt lumpectomy  . CARDIOVERSION N/A 02/19/2019   Procedure: CARDIOVERSION;  Surgeon: Dorothy Spark, MD;  Location: [facility] ENDOSCOPY;  Service: Cardiovascular;  Laterality: N/A;  . CORONARY ATHERECTOMY N/A 04/30/2019   Procedure: CORONARY ATHERECTOMY;  Surgeon: Belva Crome, MD;  Location: [place] CV LAB;  Service: Cardiovascular;  Laterality: N/A;  LAD  . CORONARY STENT INTERVENTION N/A 04/30/2019   Procedure: CORONARY STENT INTERVENTION;  Surgeon: Belva Crome, MD;  Location: [place] CV LAB;  Service: Cardiovascular;  Laterality: N/A;  DES TO PROX-MID LAD  . EYE SURGERY  2001   macular hole  . JOINT REPLACEMENT  2016  . KNEE SURGERY    . LEFT HEART CATH AND CORONARY ANGIOGRAPHY N/A 04/30/2019   Procedure: LEFT HEART CATH AND CORONARY ANGIOGRAPHY;  Surgeon: Belva Crome, MD;  Location: [place] CV LAB;  Service: Cardiovascular;  Laterality: N/A;  . MIDDLE EAR SURGERY Left 08/18/2015   repair eardrum and reconstruction  . OVARY SURGERY  40 years ago   wedge  . TOTAL HIP ARTHROPLASTY Right  07/30/2014   Procedure: TOTAL HIP ARTHROPLASTY ANTERIOR APPROACH;  Surgeon: Melrose Nakayama, MD;  Location: [place];  Service: Orthopedics;  Laterality: Right;  . TOTAL KNEE ARTHROPLASTY Left 11/04/2015  . TOTAL KNEE ARTHROPLASTY Left 11/04/2015   Procedure: TOTAL KNEE ARTHROPLASTY;  Surgeon: Melrose Nakayama, MD;  Location: [place];  Service: Orthopedics;  Laterality: Left;  . TYMPANOPLASTY  30 years ago    OB History   No obstetric history on file.      Home Medications    Prior to Admission medications   Medication Sig Start Date End Date  Taking? Authorizing Provider  acetaminophen (TYLENOL) 650 MG CR tablet Take 1,300 mg by mouth every 8 (eight) hours as needed for pain.    [provider]  Ascorbic Acid (VITAMIN C) 1000 MG tablet Take 1,000 mg by mouth daily.     [provider]  BIOTIN PO Take 10,000 Units by mouth daily with breakfast.     [provider]  cephALEXin (KEFLEX) 500 MG capsule Take 1 capsule (500 mg total) by mouth 2 (two) times daily. 01/09/20   Vanessa Kick, MD  Cholecalciferol (VITAMIN D-3) 1000 UNITS CAPS Take 1,000 Units by mouth daily with breakfast.     [provider]  cyanocobalamin 1000 MCG tablet Take 1,000 mcg by mouth daily.     [provider]  dofetilide (TIKOSYN) 125 MCG capsule Take 1 capsule (125 mcg total) by mouth 2 (two) times daily. 09/19/19   Evans Lance, MD  ELIQUIS 5 MG TABS tablet Take 1 tablet (5 mg total) by mouth 2 (two) times daily. 07/19/19   Belva Crome, MD  esomeprazole (NEXIUM) 20 MG capsule Take 20 mg by mouth daily at 12 noon.    [provider]  ferrous sulfate 325 (65 FE) MG tablet Take 325 mg by mouth daily with breakfast.    [provider]  furosemide (LASIX) 40 MG tablet Take 1 tablet (40 mg total) by mouth daily. 07/16/19 10/26/19  Evans Lance, MD  gabapentin (NEURONTIN) 100 MG capsule Take 100 mg by mouth 2 (two) times daily. 08/09/19   [provider]  LORazepam (ATIVAN) 1 MG tablet Take 1 mg by mouth at bedtime.     [provider]  losartan (COZAAR) 100 MG tablet Take 100 mg by mouth daily.     [provider]  magnesium oxide (MAG-OX) 400 (241.3 Mg) MG tablet Take 1 tablet (400 mg total) by mouth daily. 01/16/20   Evans Lance, MD  Melatonin 10 MG TABS Take 1 tablet by mouth as needed (sleep).    [provider]  potassium chloride (KLOR-CON) 10 MEQ tablet Take 4 tablets (40 mEq total) by mouth daily. 06/14/19   Shirley Friar, PA-C  pyridOXINE (VITAMIN  B-6) 100 MG tablet Take 100 mg by mouth daily.    [provider]  rosuvastatin (CRESTOR) 40 MG tablet Take 1 tablet (40 mg total) by mouth daily at 6 PM. 11/26/19   Belva Crome, MD  vitamin E 400 UNIT capsule Take 400 Units by mouth daily.    [provider]    Family History Family History  Problem Relation Age of Onset  . Heart disease Mother   . Cancer Brother        colon, pancreatic, brain    Social History Social History   Tobacco Use  . Smoking status: Never Smoker  . Smokeless tobacco: Never Used  Vaping Use  . Vaping  Use: Never used  Substance Use Topics  . Alcohol use: Not Currently  . Drug use: No     Allergies   Fenofibrate and Ondansetron   Review of Systems Review of Systems  Constitutional: Negative for chills and fever.  HENT: Negative for ear pain and sore throat.   Eyes: Negative for pain and visual disturbance.  Respiratory: Negative for cough and shortness of breath.   Cardiovascular: Negative for chest pain and palpitations.  Gastrointestinal: Negative for abdominal pain and vomiting.  Genitourinary: Positive for frequency. Negative for decreased urine volume, difficulty urinating, dysuria, hematuria, pelvic pain and urgency.  Musculoskeletal: Positive for back pain (left lower). Negative for arthralgias and gait problem.  Skin: Negative for color change and rash.  Neurological: Negative for seizures, syncope, weakness and numbness.  All other systems reviewed and are negative.    Physical Exam Triage Vital Signs ED Triage Vitals  Enc Vitals Group     BP 02/23/20 1036 134/69     Pulse Rate 02/23/20 1036 65     Resp 02/23/20 1036 18     Temp 02/23/20 1036 97.7 F (36.5 C)     Temp Source 02/23/20 1036 Oral     SpO2 02/23/20 1036 97 %     Weight --      Height --      Head Circumference --      Peak Flow --      Pain Score 02/23/20 1039 6     Pain Loc --      Pain Edu? --      Excl. in [place]? --    No data  found.  Updated Vital Signs BP 134/69 (BP Location: Left Arm)   Pulse 65   Temp 97.7 F (36.5 C) (Oral)   Resp 18   SpO2 97%   Visual Acuity Right Eye Distance:   Left Eye Distance:   Bilateral Distance:    Right Eye Near:   Left Eye Near:    Bilateral Near:     Physical Exam Vitals and nursing note reviewed.  Constitutional:      General: She is not in acute distress.    Appearance: She is well-developed and well-nourished.  HENT:     Head: Normocephalic and atraumatic.  Eyes:     Conjunctiva/sclera: Conjunctivae normal.  Cardiovascular:     Rate and Rhythm: Normal rate and regular rhythm.     Heart sounds: No murmur heard.   Pulmonary:     Effort: Pulmonary effort is normal. No respiratory distress.     Breath sounds: Normal breath sounds.  Abdominal:     Palpations: Abdomen is soft.     Tenderness: There is no abdominal tenderness.  Musculoskeletal:        General: No edema.     Cervical back: Neck supple.     Lumbar back: Tenderness (mild tenderness to left lumber paraspinal musculature without spasm noted) present. No swelling or spasms.     Comments: No bony tenderness or step offs. No CVA tenderness with percussion  Skin:    General: Skin is warm and dry.  Neurological:     Mental Status: She is alert.  Psychiatric:        Mood and Affect: Mood and affect normal.      UC Treatments / Results  Labs (all labs ordered are listed, but only abnormal results are displayed) Labs Reviewed  POCT URINALYSIS DIPSTICK, ED / UC    EKG   Radiology No  results found.  Procedures Procedures (including critical care time)  Medications Ordered in UC Medications - No data to display  Initial Impression / Assessment and Plan / UC Course  I have reviewed the triage vital signs and the nursing notes.  Pertinent labs & imaging results that were available during my care of the patient were reviewed by me and considered in my medical decision making (see chart  for details).     UA normal. Pt does endorse increased urinary frequency, but denies any other urinary sx.  Will treat for lumbar strain. Pt can take tylenol as needed.  Recommend ice/heat to affected area and light stretching. Return precautions discussed.  Final Clinical Impressions(s) / UC Diagnoses   Final diagnoses:  None   Discharge Instructions   None    ED Prescriptions    None     PDMP not reviewed this encounter.   Konrad Felix, PA-C 02/23/20 1059

## 2020-02-23 NOTE — Discharge Instructions (Signed)
Recommend ice/heat to affected areas Light stretching as tolerated

## 2020-02-23 NOTE — ED Triage Notes (Signed)
Pt presents with lower back pain and increased urinary frequency x 3 days; chills started today.

## 2020-03-03 NOTE — Progress Notes (Signed)
Carelink Summary Report / Loop Recorder 

## 2020-03-19 ENCOUNTER — Telehealth: Payer: Self-pay

## 2020-03-19 NOTE — Telephone Encounter (Signed)
The pt left her completed [employer] application and a copy of her insurance info at the office. I completed the provider page of the application, printed pts med/allegy list and emailed all to Dr Darliss Ridgel nurse so she can obtain his signature, date it, and to fax all to [facility] at the fax number written on the cover letter included.

## 2020-03-21 NOTE — Telephone Encounter (Signed)
Paperwork signed and faxed over.  Confirmation sheet received.

## 2020-03-24 ENCOUNTER — Ambulatory Visit (INDEPENDENT_AMBULATORY_CARE_PROVIDER_SITE_OTHER): Payer: Medicare Other

## 2020-03-24 DIAGNOSIS — R55 Syncope and collapse: Secondary | ICD-10-CM

## 2020-03-24 DIAGNOSIS — I4819 Other persistent atrial fibrillation: Secondary | ICD-10-CM | POA: Diagnosis not present

## 2020-03-26 LAB — CUP PACEART REMOTE DEVICE CHECK
Date Time Interrogation Session: 20220114183716
Implantable Pulse Generator Implant Date: 20211109

## 2020-03-28 DIAGNOSIS — L659 Nonscarring hair loss, unspecified: Secondary | ICD-10-CM | POA: Diagnosis not present

## 2020-03-28 DIAGNOSIS — E1169 Type 2 diabetes mellitus with other specified complication: Secondary | ICD-10-CM | POA: Diagnosis not present

## 2020-03-28 DIAGNOSIS — R011 Cardiac murmur, unspecified: Secondary | ICD-10-CM | POA: Diagnosis not present

## 2020-03-28 DIAGNOSIS — R432 Parageusia: Secondary | ICD-10-CM | POA: Diagnosis not present

## 2020-03-28 DIAGNOSIS — D649 Anemia, unspecified: Secondary | ICD-10-CM | POA: Diagnosis not present

## 2020-03-28 DIAGNOSIS — E559 Vitamin D deficiency, unspecified: Secondary | ICD-10-CM | POA: Diagnosis not present

## 2020-03-28 DIAGNOSIS — R059 Cough, unspecified: Secondary | ICD-10-CM | POA: Diagnosis not present

## 2020-03-28 DIAGNOSIS — E782 Mixed hyperlipidemia: Secondary | ICD-10-CM | POA: Diagnosis not present

## 2020-03-28 DIAGNOSIS — R430 Anosmia: Secondary | ICD-10-CM | POA: Diagnosis not present

## 2020-03-28 DIAGNOSIS — Z20828 Contact with and (suspected) exposure to other viral communicable diseases: Secondary | ICD-10-CM | POA: Diagnosis not present

## 2020-03-28 DIAGNOSIS — R5383 Other fatigue: Secondary | ICD-10-CM | POA: Diagnosis not present

## 2020-03-28 DIAGNOSIS — R0981 Nasal congestion: Secondary | ICD-10-CM | POA: Diagnosis not present

## 2020-03-31 NOTE — Telephone Encounter (Signed)
**Note De-Identified [name] Obfuscation** Letter received Sheila Gardner fax from [name] stating that they have denied the pt asst with her Eliquis. Reason:  1. The pts household income is over current eligibility criteria. 2. Pt must pay 3% oop to be approved for the year 2022.  The letter states that they have notified the pt of this denial as well.

## 2020-04-02 NOTE — Telephone Encounter (Signed)
Patient is following up regarding patient assistance for Eliquis. I made her aware, per previous encounter note, patient assistance was denied due to household income being over eligible criteria/patient needing to pay 3% out-of-pocket to be approved for 2022. Patient states she is unsure why she was not approved this year, as last year she was approved with the same income. She requested a call back to discuss further. Can we appeal?   Please advise.

## 2020-04-03 NOTE — Telephone Encounter (Signed)
**Note De-Identified Laneisha Mino Obfuscation** The pt states that she s/w [employer] and is aware that she must pay 3% out of pocket for this year which she states [employer] advised her is $200. She is aware per [employer] that once she pays that amount out of her pocket to request a 2022 expense report from her pharmacy and to mail that report to [employer].  She thanked me for calling her back.

## 2020-04-07 NOTE — Progress Notes (Signed)
Carelink Summary Report / Loop Recorder 

## 2020-04-17 ENCOUNTER — Other Ambulatory Visit: Payer: Self-pay | Admitting: Family Medicine

## 2020-04-17 DIAGNOSIS — Z1231 Encounter for screening mammogram for malignant neoplasm of breast: Secondary | ICD-10-CM

## 2020-04-23 ENCOUNTER — Telehealth: Payer: Self-pay | Admitting: *Deleted

## 2020-04-23 NOTE — Telephone Encounter (Signed)
Patient with diagnosis of afib on Eliquis for anticoagulation.    Procedure: LEFT CARPAL TUNNEL RELEASE   Date of procedure: TBD   CHA2DS2-VASc Score = 5  This indicates a 7.2% annual risk of stroke. The patient's score is based upon: CHF History: No HTN History: Yes Diabetes History: No Stroke History: No Vascular Disease History: Yes Age Score: 2 Gender Score: 1      CrCl 63.5 ml/min  Per office protocol, patient can hold Eliquis for 2 days prior to procedure.   Per Dr. [name] "if they will operate while on aspirin, please start 24 hours prior to holding Apixaban."

## 2020-04-23 NOTE — Telephone Encounter (Signed)
Will call surgeon's office in the morning in regards to if ok to remain on ASA while off Eliquis for surgery.

## 2020-04-23 NOTE — Telephone Encounter (Signed)
   [facility] Pre-operative Risk Assessment    HEARTCARE STAFF: - Please ensure there is not already an duplicate clearance open for this procedure. - Under Visit Info/Reason for Call, type in Other and utilize the format Clearance MM/DD/YY or Clearance TBD. Do not use dashes or single digits. - If request is for dental extraction, please clarify the # of teeth to be extracted.  Request for surgical clearance:  1. What type of surgery is being performed? LEFT CARPAL TUNNEL RELEASE    2. When is this surgery scheduled? TBD   3. What type of clearance is required (medical clearance vs. Pharmacy clearance to hold med vs. Both)? BOTH  4. Are there any medications that need to be held prior to surgery and how long? ELIQUIS WHEN TO STOP AND INSTRUCTIONS IF PT NEEDS LOVENOX BRIDGING   5. Practice name and name of physician performing surgery? [facility]; DR. Gwyndolyn Saxon GRAMIG   6. What is the office phone number? [phone]   7.   What is the office fax number? [place]  8.   Anesthesia type (None, local, MAC, general) ? STRAIGHT LOCAL ONLY PER CLEARANCE REQUEST   Sheila Gardner 04/23/2020, 11:04 AM  _________________________________________________________________   (provider comments below)

## 2020-04-23 NOTE — Telephone Encounter (Signed)
Will route to PharmD for rec's re: holding anticoagulation.  Will also route to Dr. [name] to see if pt needs to take ASA while off of Apixaban for surgery.  Richardson Dopp, PA-C    04/23/2020 11:38 AM

## 2020-04-23 NOTE — Telephone Encounter (Signed)
It is okay to hold Apixaban for 48-72 hours prior to Carpel tunnel surgery. If they will operate while on aspirin, please start 24 hours prior to holding Apixaban.

## 2020-04-24 LAB — CUP PACEART REMOTE DEVICE CHECK
Date Time Interrogation Session: 20220216183402
Implantable Pulse Generator Implant Date: 20211109

## 2020-04-24 NOTE — Telephone Encounter (Signed)
I s/w [facility] scheduler for Dr. Amedeo Plenty. I stated the cardiologist is needing to know if Dr. Amedeo Plenty is ok with pt being on ASA while off Eliquis for upcoming surgery. Per Judeen Hammans, Dr. Amedeo Plenty usually will hold ASA as well though he did not know the pt was on ASA, I explained the pt is not on ASA though there is a note in the clearance per Dr. [name]; See note from Pharm-D in clearance (Per office protocol, patient can hold Eliquis for 2 days prior to procedure.   Per Dr. [name] "if they will operate while on aspirin, please start 24 hours prior to holding Apixaban.") Judeen Hammans did state Dr. Amedeo Plenty is good with whatever instructions Dr. [name] feels would be the safest for the pt, so he will be ok if pt needs to be on ASA.  Judeen Hammans, did ask will pt need to remain on ASA during surgery, or will ASA be held the morning of surgery. I assured Judeen Hammans that I will get complete recommendations as to the ASA as now that Dr. Amedeo Plenty is ok with whatever instructions from Dr. [name].   I will send notes back to pre op for final recommendations. Judeen Hammans thanked me for the call and the help.

## 2020-04-24 NOTE — Telephone Encounter (Signed)
   Primary Cardiologist: Sinclair Grooms, MD  Chart reviewed as part of pre-operative protocol coverage. Given past medical history and time since last visit, based on ACC/AHA guidelines, RICKA WESTRA would be at acceptable risk for the planned procedure without further cardiovascular testing.   Per pharmacy:  Patient with diagnosis of afib on Eliquis for anticoagulation.    Procedure: LEFT CARPAL TUNNEL RELEASE Date of procedure: TBD   CHA2DS2-VASc Score = 5  This indicates a 7.2% annual risk of stroke. The patient's score is based upon: CHF History: No HTN History: Yes Diabetes History: No Stroke History: No Vascular Disease History: Yes Age Score: 2 Gender Score: 1   CrCl 63.5 ml/min  Per office protocol, patient can hold Eliquis for 2 days prior to procedure. 24 hours prior to holding Eliquis, or three days prior to procedure, the patient will need to start ASA 81mg  PO QD and remain on that throught the procedural period. She may stop the ASA at resumption of Eliquis.   I will route this recommendation to the requesting party via Epic fax function and remove from pre-op pool.  Please call with questions.  Kathyrn Drown, NP 04/24/2020, 1:25 PM

## 2020-04-28 ENCOUNTER — Ambulatory Visit (INDEPENDENT_AMBULATORY_CARE_PROVIDER_SITE_OTHER): Payer: Medicare Other

## 2020-04-28 DIAGNOSIS — I4819 Other persistent atrial fibrillation: Secondary | ICD-10-CM

## 2020-04-30 NOTE — Progress Notes (Unsigned)
Cardiology Office Note:    Date:  05/01/2020   ID:  Sheila Gardner, DOB [birth year], MRN [id number]  PCP:  Lawerance Cruel, MD  Cardiologist:  Sinclair Grooms, MD   Referring MD: Lawerance Cruel, MD   Chief Complaint  Patient presents with  . Atrial Fibrillation  . Coronary Artery Disease    History of Present Illness:    Sheila Gardner is a 85 y.o. female with a hx of  Persistent atrial fibrillation, anticoagulation, heart murmur, hypertension, h/o syncope, and osteoarthritis.  She has having a rare palpitations.  No episodes of atrial fibrillation.  Shows me a loop recording report that reveals no episodes of atrial fibrillation and one episode of a 3-second pause without 8 specific time noted.  She denies episodes of fainting or dizziness.  She has not had anginal quality chest pain.  Denies dyspnea.  Past Medical History:  Diagnosis Date  . Arthritis   . Atrial fibrillation ([place])   . Breast cancer ([place])   . Cancer ([place])    Breast- rt  . Cough with sputum 2016  . Elevated liver function tests 05/09/2013  . GERD (gastroesophageal reflux disease)    COSTOCHONDRITIS  . Heart murmur   . Hip pain 05/08/2014  . Hyperlipidemia   . Hypertension   . Lipoma    back of neck  . PONV (postoperative nausea and vomiting)    only after rt hip surgery  . Primary osteoarthritis of left knee 11/04/2015  . Primary osteoarthritis of right hip 07/30/2014  . Squamous cell carcinoma of skin 08/21/2019   atypical squa. proliferation-Left corner of mouth    Past Surgical History:  Procedure Laterality Date  . ANKLE FRACTURE SURGERY Right 2013   RIGHT   . BREAST LUMPECTOMY Right 2011  . BREAST SURGERY  04/22/2009   Rt lumpectomy  . CARDIOVERSION N/A 02/19/2019   Procedure: CARDIOVERSION;  Surgeon: Dorothy Spark, MD;  Location: [facility] ENDOSCOPY;  Service: Cardiovascular;  Laterality: N/A;  . CORONARY ATHERECTOMY N/A 04/30/2019   Procedure: CORONARY ATHERECTOMY;  Surgeon: Belva Crome, MD;  Location: [place] CV LAB;  Service: Cardiovascular;  Laterality: N/A;  LAD  . CORONARY STENT INTERVENTION N/A 04/30/2019   Procedure: CORONARY STENT INTERVENTION;  Surgeon: Belva Crome, MD;  Location: [place] CV LAB;  Service: Cardiovascular;  Laterality: N/A;  DES TO PROX-MID LAD  . EYE SURGERY  2001   macular hole  . JOINT REPLACEMENT  2016  . KNEE SURGERY    . LEFT HEART CATH AND CORONARY ANGIOGRAPHY N/A 04/30/2019   Procedure: LEFT HEART CATH AND CORONARY ANGIOGRAPHY;  Surgeon: Belva Crome, MD;  Location: [place] CV LAB;  Service: Cardiovascular;  Laterality: N/A;  . MIDDLE EAR SURGERY Left 08/18/2015   repair eardrum and reconstruction  . OVARY SURGERY  40 years ago   wedge  . TOTAL HIP ARTHROPLASTY Right 07/30/2014   Procedure: TOTAL HIP ARTHROPLASTY ANTERIOR APPROACH;  Surgeon: Melrose Nakayama, MD;  Location: [place];  Service: Orthopedics;  Laterality: Right;  . TOTAL KNEE ARTHROPLASTY Left 11/04/2015  . TOTAL KNEE ARTHROPLASTY Left 11/04/2015   Procedure: TOTAL KNEE ARTHROPLASTY;  Surgeon: Melrose Nakayama, MD;  Location: [facility];  Service: Orthopedics;  Laterality: Left;  . TYMPANOPLASTY  30 years ago    Current Medications: Current Meds  Medication Sig  . acetaminophen (TYLENOL) 650 MG CR tablet Take 1,300 mg by mouth every 8 (eight) hours as needed for pain.  Marland Kitchen  Ascorbic Acid (VITAMIN C) 1000 MG tablet Take 1,000 mg by mouth daily.   Marland Kitchen BIOTIN PO Take 10,000 Units by mouth daily with breakfast.   . Cholecalciferol (VITAMIN D-3) 1000 UNITS CAPS Take 1,000 Units by mouth daily with breakfast.   . cyanocobalamin 1000 MCG tablet Take 1,000 mcg by mouth daily.   Marland Kitchen dofetilide (TIKOSYN) 125 MCG capsule Take 1 capsule (125 mcg total) by mouth 2 (two) times daily.  Marland Kitchen ELIQUIS 5 MG TABS tablet Take 1 tablet (5 mg total) by mouth 2 (two) times daily.  Marland Kitchen esomeprazole (NEXIUM) 20 MG capsule Take 20 mg by mouth daily at 12 noon.  . ferrous sulfate 325 (65 FE) MG tablet Take  325 mg by mouth daily with breakfast.  . furosemide (LASIX) 40 MG tablet Take 1 tablet (40 mg total) by mouth daily.  Marland Kitchen gabapentin (NEURONTIN) 100 MG capsule Take 100 mg by mouth 2 (two) times daily.  Marland Kitchen LORazepam (ATIVAN) 1 MG tablet Take 1 mg by mouth at bedtime.  Marland Kitchen losartan (COZAAR) 100 MG tablet Take 100 mg by mouth daily.   . magnesium oxide (MAG-OX) 400 (241.3 Mg) MG tablet Take 1 tablet (400 mg total) by mouth daily.  . Melatonin 10 MG TABS Take 1 tablet by mouth as needed (sleep).  . potassium chloride (KLOR-CON) 10 MEQ tablet Take 4 tablets (40 mEq total) by mouth daily.  Marland Kitchen pyridOXINE (VITAMIN B-6) 100 MG tablet Take 100 mg by mouth daily.  . rosuvastatin (CRESTOR) 40 MG tablet Take 1 tablet (40 mg total) by mouth daily at 6 PM.  . vitamin E 400 UNIT capsule Take 400 Units by mouth daily.     Allergies:   Fenofibrate and Ondansetron   Social History   Socioeconomic History  . Marital status: Widowed    Spouse name: Not on file  . Number of children: Not on file  . Years of education: Not on file  . Highest education level: Not on file  Occupational History  . Not on file  Tobacco Use  . Smoking status: Never Smoker  . Smokeless tobacco: Never Used  Vaping Use  . Vaping Use: Never used  Substance and Sexual Activity  . Alcohol use: Not Currently  . Drug use: No  . Sexual activity: Not Currently    Birth control/protection: None  Other Topics Concern  . Not on file  Social History Narrative  . Not on file   Social Determinants of Health   Financial Resource Strain: Not on file  Food Insecurity: Not on file  Transportation Needs: Not on file  Physical Activity: Not on file  Stress: Not on file  Social Connections: Not on file     Family History: The patient's family history includes Cancer in her brother; Heart disease in her mother.  ROS:   Please see the history of present illness.    Anxiety concerning her overall condition.  Concerned about heart rates  in the high 40s and low 50s though she is asymptomatic.  Taking her medications according to recommendations.  All other systems reviewed and are negative.  EKGs/Labs/Other Studies Reviewed:    The following studies were reviewed today: 2D Doppler echocardiogram October 2020: IMPRESSIONS  1. Left ventricular ejection fraction, by visual estimation, is 65 to  70%. The left ventricle has hyperdynamic function. Normal left ventricular  size. There is moderately increased left ventricular hypertrophy. Normal  wall motion.  2. Left ventricular diastolic Doppler parameters are indeterminate  pattern of LV  diastolic filling.  3. Global right ventricle has normal systolic function.The right  ventricular size is normal. No increase in right ventricular wall  thickness.  4. Left atrial size was normal.  5. Right atrial size was normal.  6. Mild mitral annular calcification.  7. The mitral valve is normal in structure. Trace mitral valve  regurgitation. No evidence of mitral stenosis.  8. The tricuspid valve is normal in structure. Tricuspid valve  regurgitation is trivial.  9. The aortic valve is tricuspid Aortic valve regurgitation was not  visualized by color flow Doppler. Mild aortic valve sclerosis without  stenosis.  10. TR signal is inadequate for assessing pulmonary artery systolic  pressure.  11. The inferior vena cava is normal in size with greater than 50%  respiratory variability, suggesting right atrial pressure of 3 mmHg.   EKG:  EKG no new data  Recent Labs: 06/04/2019: ALT 15; NT-Pro BNP 744; TSH 2.050 07/13/2019: Magnesium 2.3 11/06/2019: BUN 11; Creatinine, Ser 0.71; Hemoglobin 10.1; Platelets 238; Potassium 4.1; Sodium 128  Recent Lipid Panel No results found for: CHOL, TRIG, HDL, CHOLHDL, VLDL, LDLCALC, LDLDIRECT  Physical Exam:    VS:  BP 124/60   Pulse (!) 49   Ht 5' 4.5" (1.638 m)   Wt 186 lb 6.4 oz (84.6 kg)   SpO2 99%   BMI 31.50 kg/m     Wt  Readings from Last 3 Encounters:  05/01/20 186 lb 6.4 oz (84.6 kg)  01/15/20 182 lb (82.6 kg)  01/11/20 183 lb 6.4 oz (83.2 kg)     GEN: Elderly. No acute distress HEENT: Normal NECK: No JVD. LYMPHATICS: No lymphadenopathy CARDIAC: 2/6 systolic murmur. RRR no gallop, or edema. VASCULAR:  Normal Pulses. No bruits. RESPIRATORY:  Clear to auscultation without rales, wheezing or rhonchi  ABDOMEN: Soft, non-tender, non-distended, No pulsatile mass, MUSCULOSKELETAL: No deformity  SKIN: Warm and dry NEUROLOGIC:  Alert and oriented x 3 PSYCHIATRIC:  Normal affect   ASSESSMENT:    1. Persistent atrial fibrillation ([place])   2. Syncope, unspecified syncope type   3. Coronary artery disease involving native coronary artery of native heart without angina pectoris   4. Essential hypertension   5. Pure hypercholesterolemia   6. Chronic anticoagulation   7. Type 2 diabetes mellitus with complication, without long-term current use of insulin ([place])   8. Right bundle branch block   9. Educated about COVID-19 virus infection   44. Systolic murmur    PLAN:    In order of problems listed above:  1. Clinically in normal sinus rhythm.  Dofetilide is suppressing recurrent episodes. 2. No episodes of syncope. 3. Denies angina.  Secondary prevention discussed.  Excellent blood pressure control continue Lasix, Cozaar, and salt restriction. 4. Continue rosuvastatin 40 mg/day. 5. Continue Eliquis as prescribed.  Monitor for bleeding. 6. Decrease carbohydrate in diet.  Hemoglobin A1c was 5.9 in January.  She is doing quite well. 7. No change 8. Vaccinated and practicing social distancing. 9. Likely progression to aortic stenosis.  2D Doppler echocardiogram prior to next office visit in 6 months.  Overall education and awareness concerning secondary risk prevention was discussed in detail: LDL less than 70, hemoglobin A1c less than 7, blood pressure target less than 130/80 mmHg, >150 minutes of moderate  aerobic activity per week, avoidance of smoking, weight control (via diet and exercise), and continued surveillance/management of/for obstructive sleep apnea.    Medication Adjustments/Labs and Tests Ordered: Current medicines are reviewed at length with the patient today.  Concerns regarding medicines are outlined above.  Orders Placed This Encounter  Procedures  . ECHOCARDIOGRAM COMPLETE   No orders of the defined types were placed in this encounter.   Patient Instructions  Medication Instructions:  Your physician recommends that you continue on your current medications as directed. Please refer to the Current Medication list given to you today.  *If you need a refill on your cardiac medications before your next appointment, please call your pharmacy*   Lab Work: None If you have labs (blood work) drawn today and your tests are completely normal, you will receive your results only by: Marland Kitchen MyChart Message (if you have MyChart) OR . A paper copy in the mail If you have any lab test that is abnormal or we need to change your treatment, we will call you to review the results.   Testing/Procedures: Your physician has requested that you have an echocardiogram in October. Echocardiography is a painless test that uses sound waves to create images of your heart. It provides your doctor with information about the size and shape of your heart and how well your heart's chambers and valves are working. This procedure takes approximately one hour. There are no restrictions for this procedure.     Follow-Up: At [facility], you and your health needs are our priority.  As part of our continuing mission to provide you with exceptional heart care, we have created designated Provider Care Teams.  These Care Teams include your primary Cardiologist (physician) and Advanced Practice Providers (APPs -  Physician Assistants and Nurse Practitioners) who all work together to provide you with the care you  need, when you need it.  We recommend signing up for the patient portal called "MyChart".  Sign up information is provided on this After Visit Summary.  MyChart is used to connect with patients for Virtual Visits (Telemedicine).  Patients are able to view lab/test results, encounter notes, upcoming appointments, etc.  Non-urgent messages can be sent to your provider as well.   To learn more about what you can do with MyChart, go to [email or web address].    Your next appointment:   9 month(s)  The format for your next appointment:   In Person  Provider:   You may see Sinclair Grooms, MD or one of the following Advanced Practice Providers on your designated Care Team:    Kathyrn Drown, NP    Other Instructions      Signed, Sinclair Grooms, MD  05/01/2020 4:10 PM    [place]

## 2020-05-01 ENCOUNTER — Ambulatory Visit (INDEPENDENT_AMBULATORY_CARE_PROVIDER_SITE_OTHER): Payer: Medicare Other | Admitting: Interventional Cardiology

## 2020-05-01 ENCOUNTER — Other Ambulatory Visit: Payer: Self-pay

## 2020-05-01 ENCOUNTER — Encounter: Payer: Self-pay | Admitting: Interventional Cardiology

## 2020-05-01 VITALS — BP 124/60 | HR 49 | Ht 64.5 in | Wt 186.4 lb

## 2020-05-01 DIAGNOSIS — I10 Essential (primary) hypertension: Secondary | ICD-10-CM

## 2020-05-01 DIAGNOSIS — I2510 Atherosclerotic heart disease of native coronary artery without angina pectoris: Secondary | ICD-10-CM

## 2020-05-01 DIAGNOSIS — E7800 Pure hypercholesterolemia, unspecified: Secondary | ICD-10-CM | POA: Diagnosis not present

## 2020-05-01 DIAGNOSIS — Z7189 Other specified counseling: Secondary | ICD-10-CM | POA: Diagnosis not present

## 2020-05-01 DIAGNOSIS — E118 Type 2 diabetes mellitus with unspecified complications: Secondary | ICD-10-CM

## 2020-05-01 DIAGNOSIS — R55 Syncope and collapse: Secondary | ICD-10-CM | POA: Diagnosis not present

## 2020-05-01 DIAGNOSIS — I4510 Unspecified right bundle-branch block: Secondary | ICD-10-CM

## 2020-05-01 DIAGNOSIS — I4819 Other persistent atrial fibrillation: Secondary | ICD-10-CM | POA: Diagnosis not present

## 2020-05-01 DIAGNOSIS — Z7901 Long term (current) use of anticoagulants: Secondary | ICD-10-CM | POA: Diagnosis not present

## 2020-05-01 DIAGNOSIS — R011 Cardiac murmur, unspecified: Secondary | ICD-10-CM

## 2020-05-01 NOTE — Patient Instructions (Signed)
Medication Instructions:  Your physician recommends that you continue on your current medications as directed. Please refer to the Current Medication list given to you today.  *If you need a refill on your cardiac medications before your next appointment, please call your pharmacy*   Lab Work: None If you have labs (blood work) drawn today and your tests are completely normal, you will receive your results only by: Marland Kitchen MyChart Message (if you have MyChart) OR . A paper copy in the mail If you have any lab test that is abnormal or we need to change your treatment, we will call you to review the results.   Testing/Procedures: Your physician has requested that you have an echocardiogram in October. Echocardiography is a painless test that uses sound waves to create images of your heart. It provides your doctor with information about the size and shape of your heart and how well your heart's chambers and valves are working. This procedure takes approximately one hour. There are no restrictions for this procedure.     Follow-Up: At [facility], you and your health needs are our priority.  As part of our continuing mission to provide you with exceptional heart care, we have created designated Provider Care Teams.  These Care Teams include your primary Cardiologist (physician) and Advanced Practice Providers (APPs -  Physician Assistants and Nurse Practitioners) who all work together to provide you with the care you need, when you need it.  We recommend signing up for the patient portal called "MyChart".  Sign up information is provided on this After Visit Summary.  MyChart is used to connect with patients for Virtual Visits (Telemedicine).  Patients are able to view lab/test results, encounter notes, upcoming appointments, etc.  Non-urgent messages can be sent to your provider as well.   To learn more about what you can do with MyChart, go to [email or web address].    Your next appointment:    9 month(s)  The format for your next appointment:   In Person  Provider:   You may see Sinclair Grooms, MD or one of the following Advanced Practice Providers on your designated Care Team:    Kathyrn Drown, NP    Other Instructions

## 2020-05-01 NOTE — Progress Notes (Signed)
Carelink Summary Report / Loop Recorder 

## 2020-05-02 ENCOUNTER — Encounter (HOSPITAL_COMMUNITY): Payer: Self-pay | Admitting: Emergency Medicine

## 2020-05-02 ENCOUNTER — Other Ambulatory Visit: Payer: Self-pay

## 2020-05-02 ENCOUNTER — Ambulatory Visit (HOSPITAL_COMMUNITY)
Admission: EM | Admit: 2020-05-02 | Discharge: 2020-05-02 | Disposition: A | Discharge status: Home / Self Care | Payer: Medicare Other | Attending: Student | Admitting: Student

## 2020-05-02 DIAGNOSIS — I482 Chronic atrial fibrillation, unspecified: Secondary | ICD-10-CM | POA: Insufficient documentation

## 2020-05-02 DIAGNOSIS — R001 Bradycardia, unspecified: Secondary | ICD-10-CM | POA: Diagnosis not present

## 2020-05-02 DIAGNOSIS — R5383 Other fatigue: Secondary | ICD-10-CM | POA: Insufficient documentation

## 2020-05-02 DIAGNOSIS — M549 Dorsalgia, unspecified: Secondary | ICD-10-CM

## 2020-05-02 DIAGNOSIS — R110 Nausea: Secondary | ICD-10-CM | POA: Diagnosis not present

## 2020-05-02 LAB — POCT URINALYSIS DIPSTICK, ED / UC
Bilirubin Urine: NEGATIVE
Glucose, UA: NEGATIVE mg/dL
Hgb urine dipstick: NEGATIVE
Ketones, ur: NEGATIVE mg/dL
Leukocytes,Ua: NEGATIVE
Nitrite: NEGATIVE
Protein, ur: NEGATIVE mg/dL
Specific Gravity, Urine: 1.01 (ref 1.005–1.030)
Urobilinogen, UA: 0.2 mg/dL (ref 0.0–1.0)
pH: 6 (ref 5.0–8.0)

## 2020-05-02 NOTE — ED Provider Notes (Signed)
[place]    CSN: [id number] Arrival date & time: 05/02/20  1441      History   Chief Complaint Chief Complaint  Patient presents with  . Back Pain  . Nausea    HPI Sheila Gardner is a 85 y.o. female presenting with vague complaints of fatigue, back pain and nausea x4 days. She is concerned she has a UTI. Denies hematuria, dysuria, frequency, urgency, v/d/abd pain, fevers/chills, abdnormal vaginal discharge. Denies injury, prolonged standing/walking, radiation of pain down arms or legs, weakness/sensation changes in arms/legs. Denies chest pain, shortness of breath, dizziness, headaches, pedal edema. Denies recent URI. Feeling well otherwise. States she went to see her cardiologist 1 day ago and he was not worried about her symptoms.   HPI  Past Medical History:  Diagnosis Date  . Arthritis   . Atrial fibrillation ([place])   . Breast cancer ([place])   . Cancer ([place])    Breast- rt  . Cough with sputum 2016  . Elevated liver function tests 05/09/2013  . GERD (gastroesophageal reflux disease)    COSTOCHONDRITIS  . Heart murmur   . Hip pain 05/08/2014  . Hyperlipidemia   . Hypertension   . Lipoma    back of neck  . PONV (postoperative nausea and vomiting)    only after rt hip surgery  . Primary osteoarthritis of left knee 11/04/2015  . Primary osteoarthritis of right hip 07/30/2014  . Squamous cell carcinoma of skin 08/21/2019   atypical squa. proliferation-Left corner of mouth    Patient Active Problem List   Diagnosis Date Noted  . Dizziness 01/11/2020  . Syncope 09/19/2019  . Secondary hypercoagulable state ([place]) 06/15/2019  . Persistent atrial fibrillation ([place]) 06/11/2019  . CAD (coronary artery disease) 04/30/2019  . CAD (coronary artery disease), native coronary artery 04/28/2019  . Hyperlipidemia LDL goal <70 04/28/2019  . Hypertension 04/28/2019  . Primary osteoarthritis of left knee 11/04/2015  . Primary osteoarthritis of right hip 07/30/2014  . Hip  pain 05/08/2014  . Elevated liver function tests 05/09/2013  . Vitamin D deficiency 03/25/2011  . Malignant neoplasm of upper-outer quadrant of right breast in female, estrogen receptor positive ([place]) 12/30/2010    Past Surgical History:  Procedure Laterality Date  . ANKLE FRACTURE SURGERY Right 2013   RIGHT   . BREAST LUMPECTOMY Right 2011  . BREAST SURGERY  04/22/2009   Rt lumpectomy  . CARDIOVERSION N/A 02/19/2019   Procedure: CARDIOVERSION;  Surgeon: Dorothy Spark, MD;  Location: [facility] ENDOSCOPY;  Service: Cardiovascular;  Laterality: N/A;  . CORONARY ATHERECTOMY N/A 04/30/2019   Procedure: CORONARY ATHERECTOMY;  Surgeon: Belva Crome, MD;  Location: [place] CV LAB;  Service: Cardiovascular;  Laterality: N/A;  LAD  . CORONARY STENT INTERVENTION N/A 04/30/2019   Procedure: CORONARY STENT INTERVENTION;  Surgeon: Belva Crome, MD;  Location: [place] CV LAB;  Service: Cardiovascular;  Laterality: N/A;  DES TO PROX-MID LAD  . EYE SURGERY  2001   macular hole  . JOINT REPLACEMENT  2016  . KNEE SURGERY    . LEFT HEART CATH AND CORONARY ANGIOGRAPHY N/A 04/30/2019   Procedure: LEFT HEART CATH AND CORONARY ANGIOGRAPHY;  Surgeon: Belva Crome, MD;  Location: [place] CV LAB;  Service: Cardiovascular;  Laterality: N/A;  . MIDDLE EAR SURGERY Left 08/18/2015   repair eardrum and reconstruction  . OVARY SURGERY  40 years ago   wedge  . TOTAL HIP ARTHROPLASTY Right 07/30/2014   Procedure: TOTAL HIP ARTHROPLASTY  ANTERIOR APPROACH;  Surgeon: Melrose Nakayama, MD;  Location: [place];  Service: Orthopedics;  Laterality: Right;  . TOTAL KNEE ARTHROPLASTY Left 11/04/2015  . TOTAL KNEE ARTHROPLASTY Left 11/04/2015   Procedure: TOTAL KNEE ARTHROPLASTY;  Surgeon: Melrose Nakayama, MD;  Location: [place];  Service: Orthopedics;  Laterality: Left;  . TYMPANOPLASTY  30 years ago    OB History   No obstetric history on file.      Home Medications    Prior to Admission medications    Medication Sig Start Date End Date Taking? Authorizing Provider  acetaminophen (TYLENOL) 650 MG CR tablet Take 1,300 mg by mouth every 8 (eight) hours as needed for pain.    [provider]  Ascorbic Acid (VITAMIN C) 1000 MG tablet Take 1,000 mg by mouth daily.     [provider]  BIOTIN PO Take 10,000 Units by mouth daily with breakfast.     [provider]  Cholecalciferol (VITAMIN D-3) 1000 UNITS CAPS Take 1,000 Units by mouth daily with breakfast.     [provider]  cyanocobalamin 1000 MCG tablet Take 1,000 mcg by mouth daily.     [provider]  dofetilide (TIKOSYN) 125 MCG capsule Take 1 capsule (125 mcg total) by mouth 2 (two) times daily. 09/19/19   Evans Lance, MD  ELIQUIS 5 MG TABS tablet Take 1 tablet (5 mg total) by mouth 2 (two) times daily. 07/19/19   Belva Crome, MD  esomeprazole (NEXIUM) 20 MG capsule Take 20 mg by mouth daily at 12 noon.    [provider]  ferrous sulfate 325 (65 FE) MG tablet Take 325 mg by mouth daily with breakfast.    [provider]  furosemide (LASIX) 40 MG tablet Take 1 tablet (40 mg total) by mouth daily. 07/16/19 10/26/19  Evans Lance, MD  gabapentin (NEURONTIN) 100 MG capsule Take 100 mg by mouth 2 (two) times daily. 08/09/19   [provider]  LORazepam (ATIVAN) 1 MG tablet Take 1 mg by mouth at bedtime.    [provider]  losartan (COZAAR) 100 MG tablet Take 100 mg by mouth daily.     [provider]  magnesium oxide (MAG-OX) 400 (241.3 Mg) MG tablet Take 1 tablet (400 mg total) by mouth daily. 01/16/20   Evans Lance, MD  Melatonin 10 MG TABS Take 1 tablet by mouth as needed (sleep).    [provider]  potassium chloride (KLOR-CON) 10 MEQ tablet Take 4 tablets (40 mEq total) by mouth daily. 06/14/19   Shirley Friar, PA-C  pyridOXINE (VITAMIN B-6) 100 MG tablet Take 100 mg by mouth daily.    [provider]  rosuvastatin  (CRESTOR) 40 MG tablet Take 1 tablet (40 mg total) by mouth daily at 6 PM. 11/26/19   Belva Crome, MD  vitamin E 400 UNIT capsule Take 400 Units by mouth daily.    [provider]    Family History Family History  Problem Relation Age of Onset  . Heart disease Mother   . Cancer Brother        colon, pancreatic, brain    Social History Social History   Tobacco Use  . Smoking status: Never Smoker  . Smokeless tobacco: Never Used  Vaping Use  . Vaping Use: Never used  Substance Use Topics  . Alcohol use: Not Currently  . Drug use: No     Allergies   Fenofibrate and Ondansetron   Review of  Systems Review of Systems  Constitutional: Negative for appetite change, chills, diaphoresis and fever.  Respiratory: Negative for shortness of breath.   Cardiovascular: Negative for chest pain.  Gastrointestinal: Positive for nausea. Negative for abdominal pain, blood in stool, constipation, diarrhea and vomiting.  Genitourinary: Negative for decreased urine volume, difficulty urinating, dysuria, flank pain, frequency, genital sores, hematuria, urgency, vaginal bleeding, vaginal discharge and vaginal pain.  Musculoskeletal: Positive for back pain.  Neurological: Negative for dizziness, weakness and light-headedness.  All other systems reviewed and are negative.    Physical Exam Triage Vital Signs ED Triage Vitals  Enc Vitals Group     BP 05/02/20 1546 (!) 128/49     Pulse Rate 05/02/20 1546 (!) 48     Resp 05/02/20 1546 17     Temp 05/02/20 1546 98.1 F (36.7 C)     Temp Source 05/02/20 1546 Oral     SpO2 05/02/20 1546 98 %     Weight --      Height --      Head Circumference --      Peak Flow --      Pain Score 05/02/20 1544 6     Pain Loc --      Pain Edu? --      Excl. in [place]? --    No data found.  Updated Vital Signs BP (!) 160/70 (BP Location: Left Arm)   Pulse (!) 46   Temp 98.1 F (36.7 C) (Oral)   Resp 17   SpO2 98%   Visual Acuity Right Eye  Distance:   Left Eye Distance:   Bilateral Distance:    Right Eye Near:   Left Eye Near:    Bilateral Near:     Physical Exam Vitals reviewed.  Constitutional:      General: She is not in acute distress.    Appearance: Normal appearance. She is not ill-appearing.  HENT:     Head: Normocephalic and atraumatic.  Cardiovascular:     Rate and Rhythm: Regular rhythm. Bradycardia present.     Pulses:          Radial pulses are 2+ on the right side and 2+ on the left side.     Heart sounds: Normal heart sounds.  Pulmonary:     Effort: Pulmonary effort is normal.     Breath sounds: Normal breath sounds.  Abdominal:     General: Abdomen is flat. Bowel sounds are normal. There is no distension.     Palpations: Abdomen is soft. There is no mass.     Tenderness: There is no abdominal tenderness. There is no right CVA tenderness, left CVA tenderness, guarding or rebound. Negative signs include Murphy's sign, Rovsing's sign and McBurney's sign.  Musculoskeletal:     Right lower leg: No edema.     Left lower leg: No edema.     Comments: No cervical, thoracic, lumbar spinous or paraspsinous muscle tenderness to palpation. No sensation changes. Strength 5/5 UEs and LEs. Negative straight leg raise.   Neurological:     General: No focal deficit present.     Mental Status: She is alert and oriented to person, place, and time. Mental status is at baseline.     Comments: CN 2-12 grossly intact. PERRLA, EOMI.  Psychiatric:        Mood and Affect: Mood normal.        Behavior: Behavior normal.        Thought Content: Thought content normal.  Judgment: Judgment normal.      UC Treatments / Results  Labs (all labs ordered are listed, but only abnormal results are displayed) Labs Reviewed  URINE CULTURE  POCT URINALYSIS DIPSTICK, ED / UC    EKG   Radiology No results found.  Procedures Procedures (including critical care time)  Medications Ordered in UC Medications - No data  to display  Initial Impression / Assessment and Plan / UC Course  I have reviewed the triage vital signs and the nursing notes.  Pertinent labs & imaging results that were available during my care of the patient were reviewed by me and considered in my medical decision making (see chart for details).      This patient is a 85 year old female presenting with fatigue, back pain and nausea. She has extensive cardiac/ medical history including a fib, heart murmur, hypertension. She is significantly bradycardic today at 46 but she is adamant that is her baseline. Reports seeing her cardiologist 1 day ago and was told everything is normal.  UA completely normal today, culture sent.  EKG with sinus bradycardia but unchanged from 2021 EKG. No pedal edema. Radial pulses are equal and symmetric. For afib, continue eliquis and Tikosyn.   I am recommending this patient head to ED for further cardiac evaluation, given her vague symptoms of fatigue and nausea coupled with significant bradycardia. She states she prefers to go home and continue OTC medications. Discussed that if she does this she could die. She verbalizes understanding.   rec PCP f/u to discuss chronic back pain and vague urinary symptoms.  Spent over 40 minutes obtaining H&P, performing physical, discussing results, treatment plan and plan for follow-up with patient. Patient agrees with plan.     Final Clinical Impressions(s) / UC Diagnoses   Final diagnoses:  Other fatigue  Chronic atrial fibrillation (HCC)  Bradycardia  Nausea without vomiting     Discharge Instructions     -Your EKG looks fairly unchanged compared to your last EKG. Your urine looks normal today. However, given your nausea, fatigue, and back pain we can't rule out a cardiac issue or heart attack. Please head straight to the ED for further cardiac workup.  -If you experience dizziness, chest pain, weakness, loss of consciousness, etc while on the way to the  ED- stop and immediately call 911.  -If you continue to experience back pain that is not controlled on OTC medications, follow-up with your PCP.     ED Prescriptions    None     PDMP not reviewed this encounter.   Hazel Sams, PA-C 05/02/20 1735

## 2020-05-02 NOTE — ED Triage Notes (Signed)
Pt presents with lower back pain and nausea xs 4 days.

## 2020-05-02 NOTE — Discharge Instructions (Signed)
-  Your EKG looks fairly unchanged compared to your last EKG. Your urine looks normal today. However, given your nausea, fatigue, and back pain we can't rule out a cardiac issue or heart attack. Please head straight to the ED for further cardiac workup.  -If you experience dizziness, chest pain, weakness, loss of consciousness, etc while on the way to the ED- stop and immediately call 911.  -If you continue to experience back pain that is not controlled on OTC medications, follow-up with your PCP.

## 2020-05-04 LAB — URINE CULTURE

## 2020-05-05 ENCOUNTER — Telehealth: Payer: Self-pay | Admitting: Interventional Cardiology

## 2020-05-05 NOTE — Telephone Encounter (Signed)
STAT if HR is under 50 or over 120 (normal HR is 60-100 beats per minute)  1) What is your heart rate? 40  2) Do you have a log of your heart rate readings (document readings)?    38, 40  3) Do you have any other symptoms? Patient just feels terrible  Patient said she felt fine when she was in the office on Thursday to see Dr. [name].

## 2020-05-05 NOTE — Telephone Encounter (Signed)
Spoke with pt who reports he current HRis between 33-42 even with activity this am.  B/P is 140/75 and in AFIB.  Pt denies current Sx of CP, dizziness, edema or SOB.  She states she does feel "bad" and has no energy.  Pt states she is taking medications as prescribed.  Will forward information to Dr Lovena Le and device clinic for review and further recommendation.  Reviewed ED precautions.  Pt verbalizes understanding and agrees with current plan.

## 2020-05-05 NOTE — Telephone Encounter (Signed)
Unfortunately, this particular device will not allow [place] to send a manual transmission.   I was able to check the website (Care[employer]) and see the patient marked a symptom event was detected at 10:08 am today 05/05/20, heart rate was mid to upper 30's. Presenting rhythm during transmission was 50 bpm.

## 2020-05-05 NOTE — Telephone Encounter (Signed)
Spoke to Dr. Lovena Le and he would like to continue with "watchful waiting." He advised that is patient becomes symptomatic with heart rates in the 30's or frequent alerts of brady hearts rates that are increasing to follow up with him.   Patient called and advised to Dr. Tanna Furry recommendation. Educated patient on how to check her pulse manually and use her symptom activator if she has increased symptoms. Explained to patient that If she is out of rhythm or has PVC's it can use her pulse ox reader to be off. Also discussed ED precautions with patient with verbal understanding. Advised patient if her symptoms persist or worsen to please call back the device clinic for follow up. Agreeable to plan.

## 2020-05-08 DIAGNOSIS — R001 Bradycardia, unspecified: Secondary | ICD-10-CM | POA: Diagnosis not present

## 2020-05-08 DIAGNOSIS — R109 Unspecified abdominal pain: Secondary | ICD-10-CM | POA: Diagnosis not present

## 2020-05-08 DIAGNOSIS — R1010 Upper abdominal pain, unspecified: Secondary | ICD-10-CM | POA: Diagnosis not present

## 2020-05-09 ENCOUNTER — Other Ambulatory Visit: Payer: Self-pay | Admitting: Family Medicine

## 2020-05-09 DIAGNOSIS — R109 Unspecified abdominal pain: Secondary | ICD-10-CM

## 2020-05-22 ENCOUNTER — Ambulatory Visit
Admission: RE | Admit: 2020-05-22 | Discharge: 2020-05-22 | Disposition: A | Discharge status: Home / Self Care | Payer: Medicare Other | Source: Ambulatory Visit | Attending: Family Medicine | Admitting: Family Medicine

## 2020-05-22 DIAGNOSIS — K8020 Calculus of gallbladder without cholecystitis without obstruction: Secondary | ICD-10-CM | POA: Diagnosis not present

## 2020-05-22 DIAGNOSIS — D1771 Benign lipomatous neoplasm of kidney: Secondary | ICD-10-CM | POA: Diagnosis not present

## 2020-05-22 DIAGNOSIS — R109 Unspecified abdominal pain: Secondary | ICD-10-CM

## 2020-05-28 DIAGNOSIS — R8290 Unspecified abnormal findings in urine: Secondary | ICD-10-CM | POA: Diagnosis not present

## 2020-05-28 DIAGNOSIS — N390 Urinary tract infection, site not specified: Secondary | ICD-10-CM | POA: Diagnosis not present

## 2020-05-28 DIAGNOSIS — E782 Mixed hyperlipidemia: Secondary | ICD-10-CM | POA: Diagnosis not present

## 2020-05-28 DIAGNOSIS — F411 Generalized anxiety disorder: Secondary | ICD-10-CM | POA: Diagnosis not present

## 2020-05-28 DIAGNOSIS — K760 Fatty (change of) liver, not elsewhere classified: Secondary | ICD-10-CM | POA: Diagnosis not present

## 2020-05-28 DIAGNOSIS — E876 Hypokalemia: Secondary | ICD-10-CM | POA: Diagnosis not present

## 2020-06-01 LAB — CUP PACEART REMOTE DEVICE CHECK
Date Time Interrogation Session: 20220321183907
Implantable Pulse Generator Implant Date: 20211109

## 2020-06-02 ENCOUNTER — Ambulatory Visit (INDEPENDENT_AMBULATORY_CARE_PROVIDER_SITE_OTHER): Payer: Medicare Other

## 2020-06-02 DIAGNOSIS — I4819 Other persistent atrial fibrillation: Secondary | ICD-10-CM | POA: Diagnosis not present

## 2020-06-05 ENCOUNTER — Ambulatory Visit
Admission: RE | Admit: 2020-06-05 | Discharge: 2020-06-05 | Disposition: A | Discharge status: Home / Self Care | Payer: Medicare Other | Source: Ambulatory Visit | Attending: Family Medicine | Admitting: Family Medicine

## 2020-06-05 ENCOUNTER — Other Ambulatory Visit: Payer: Self-pay

## 2020-06-05 DIAGNOSIS — Z1231 Encounter for screening mammogram for malignant neoplasm of breast: Secondary | ICD-10-CM

## 2020-06-12 ENCOUNTER — Telehealth: Payer: Self-pay

## 2020-06-12 NOTE — Telephone Encounter (Signed)
Returning patients phone call. Patient states she received a message in Mychart about an encounter on 06/02/20 but was not seen. Explained to patient this was a charge from the [place] remote on 05/26/20. Patient verbalized understanding and thanked me for call.

## 2020-06-12 NOTE — Progress Notes (Signed)
Carelink Summary Report / Loop Recorder 

## 2020-06-12 NOTE — Telephone Encounter (Signed)
Patient called in and wants to know if someone can give her a call back about her transmission, she needs someone to explain it to her

## 2020-06-16 DIAGNOSIS — D1771 Benign lipomatous neoplasm of kidney: Secondary | ICD-10-CM | POA: Diagnosis not present

## 2020-06-30 ENCOUNTER — Ambulatory Visit (INDEPENDENT_AMBULATORY_CARE_PROVIDER_SITE_OTHER): Payer: Medicare Other

## 2020-06-30 DIAGNOSIS — I4819 Other persistent atrial fibrillation: Secondary | ICD-10-CM

## 2020-07-01 LAB — CUP PACEART REMOTE DEVICE CHECK
Date Time Interrogation Session: 20220423183650
Implantable Pulse Generator Implant Date: 20211109

## 2020-07-08 ENCOUNTER — Ambulatory Visit: Payer: Medicare Other | Admitting: Dermatology

## 2020-07-10 IMAGING — MG DIGITAL SCREENING BILAT W/ TOMO W/ CAD
8 series · 8 of 24 positions shown · non-contrast
Comparison: Previous exam(s).

CLINICAL DATA: Screening.

EXAM:
DIGITAL SCREENING BILATERAL MAMMOGRAM WITH TOMO AND CAD

[R MLO synth-2D]
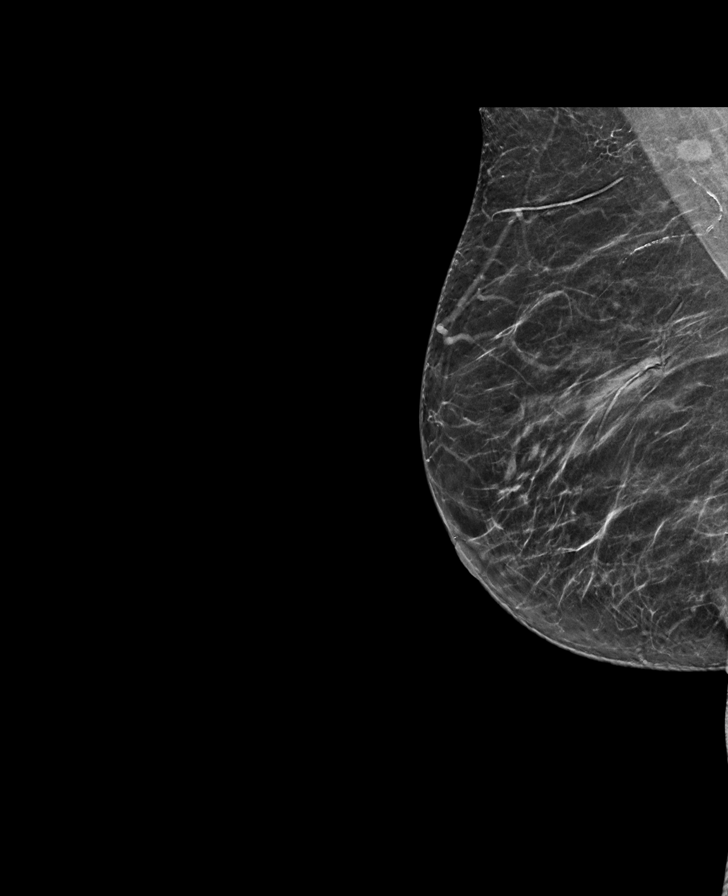

[L MLO synth-2D]
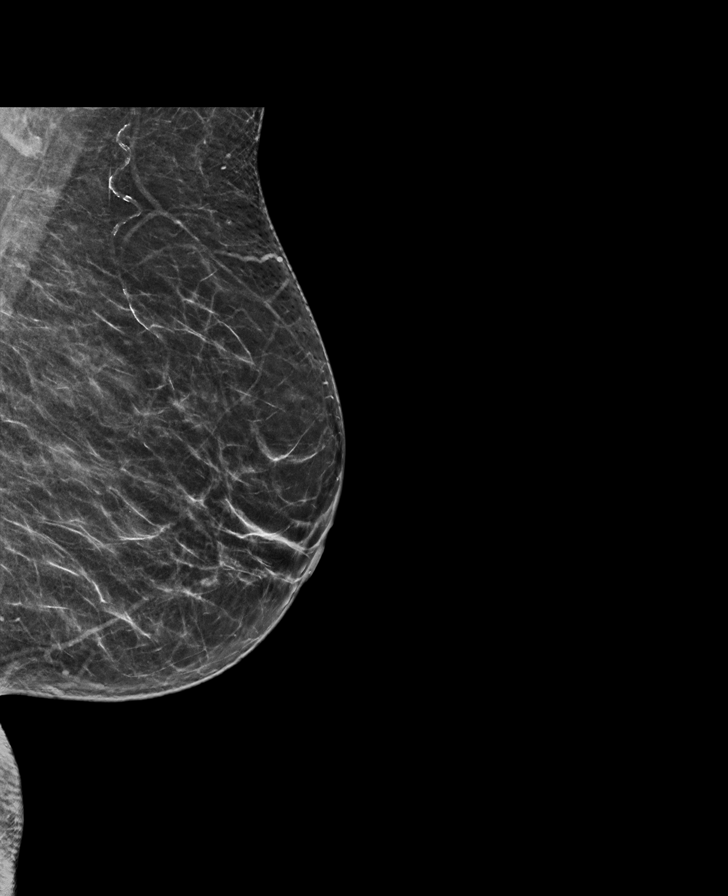

[R CC synth-2D]
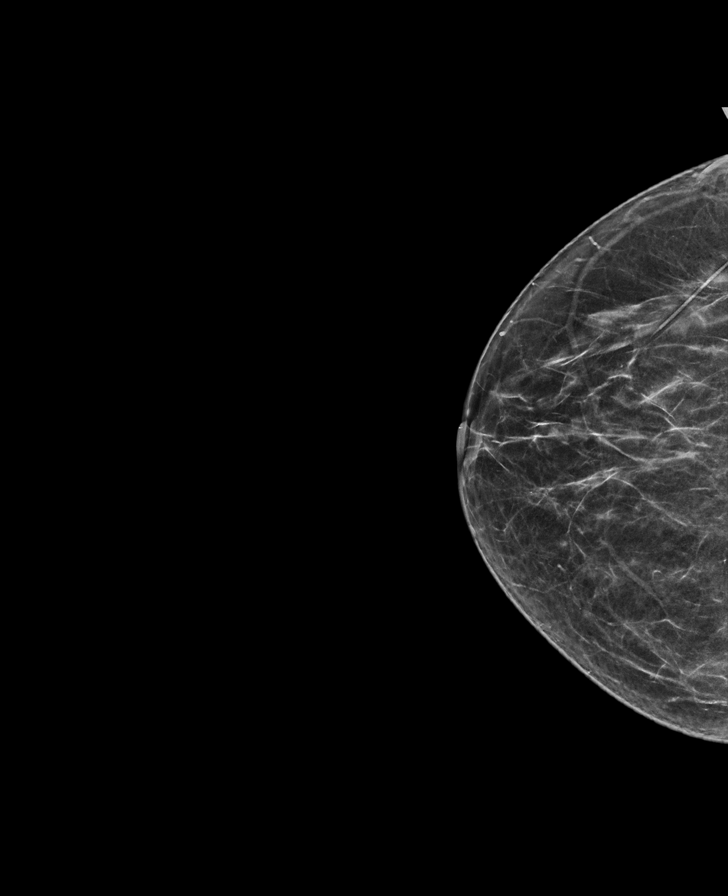

[L CC synth-2D]
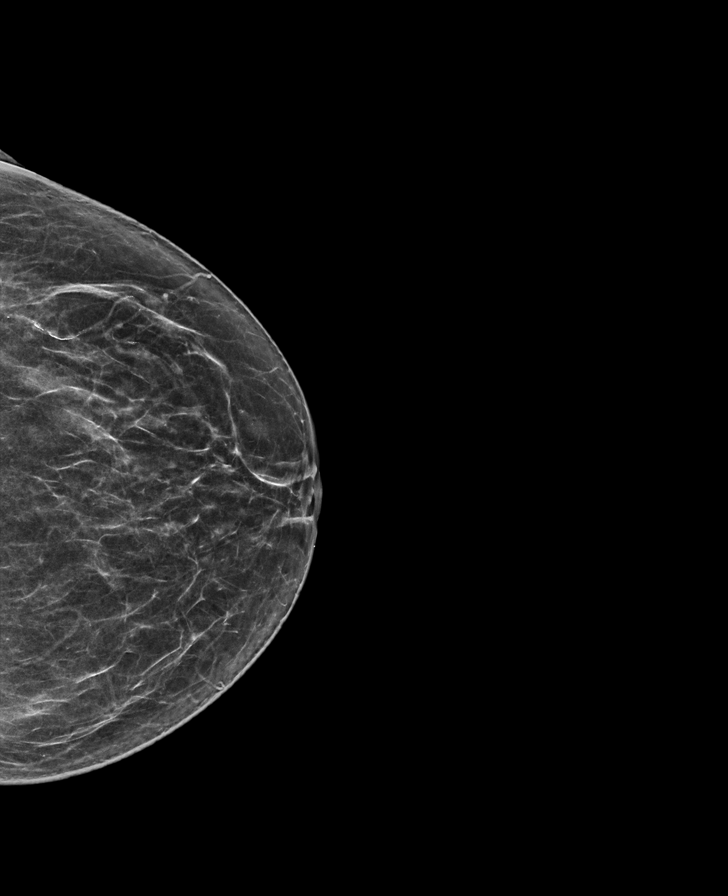

[L CC tomo · tomo slice 32/63.0]
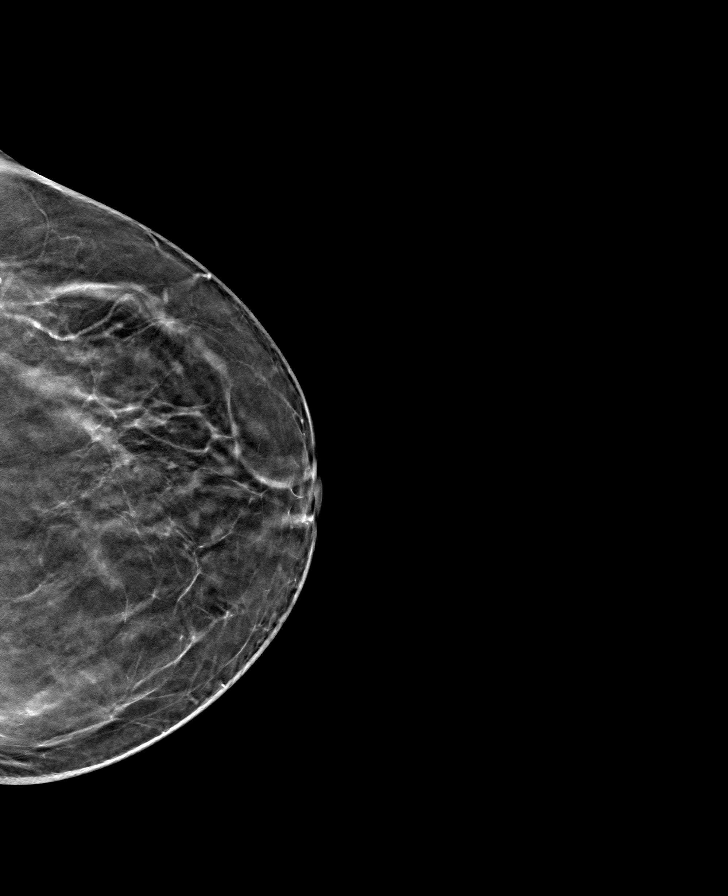

[R MLO tomo · tomo slice 31/60.0]
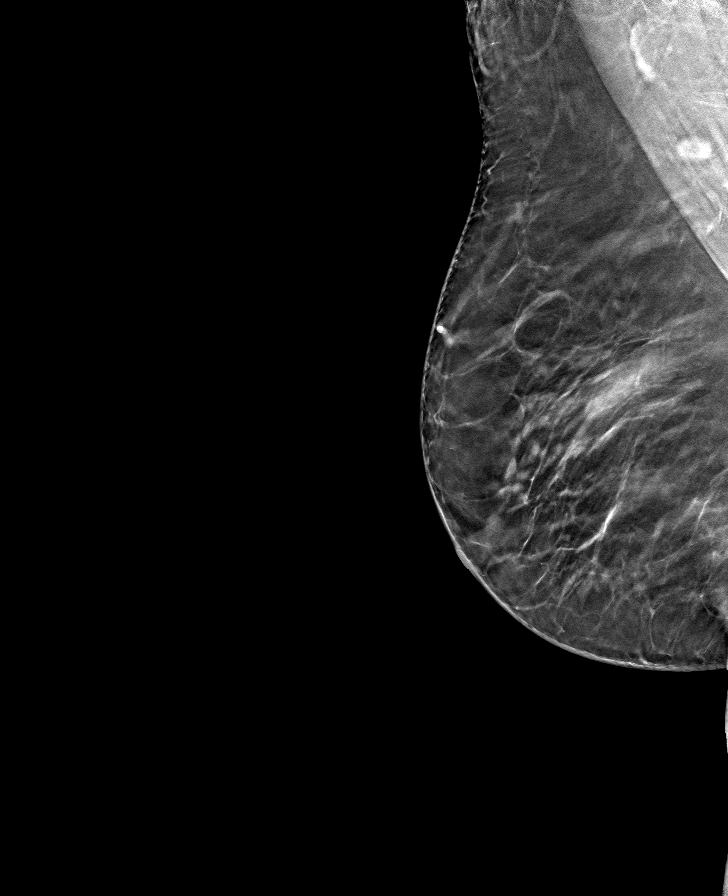

[R CC tomo · tomo slice 29/57.0]
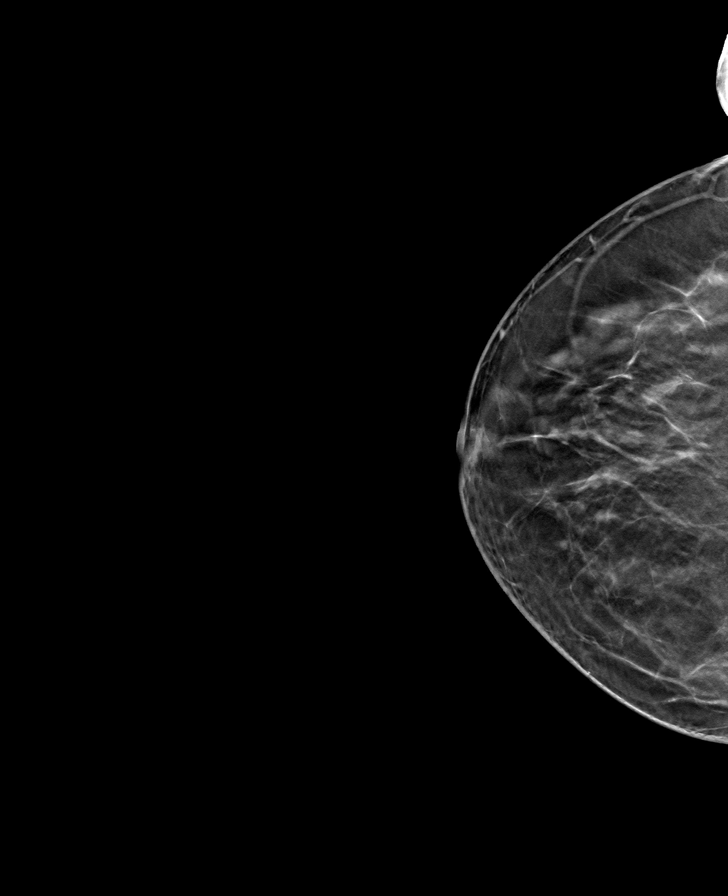

[L MLO tomo · tomo slice 33/65.0]
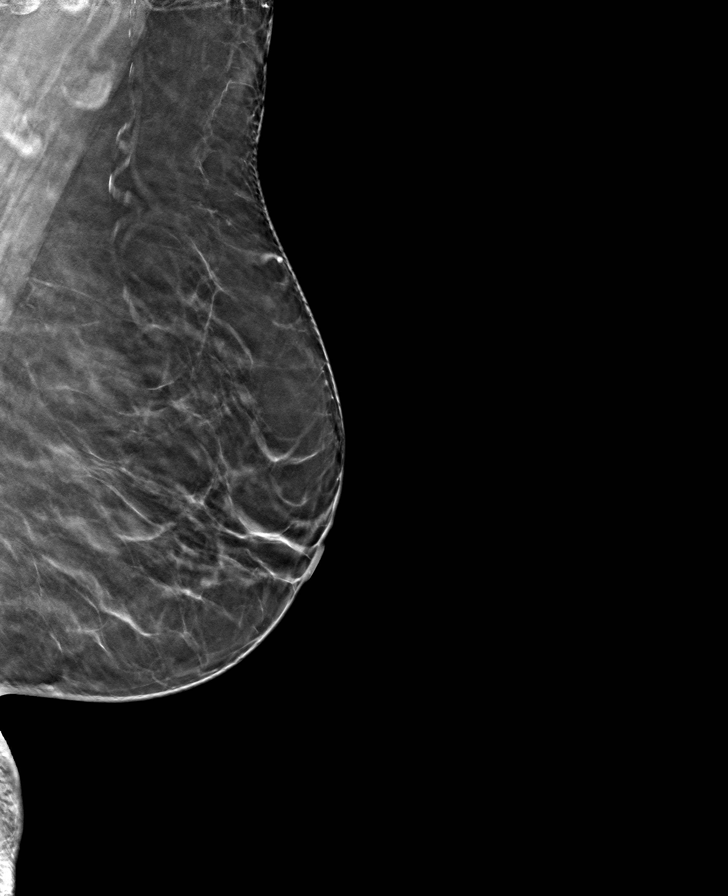

[8 of 24 positions shown; findings below may reference images not displayed]

ACR Breast Density Category b: There are scattered areas of
fibroglandular density.
FINDINGS: There are no findings suspicious for malignancy. Images were
processed with CAD.
IMPRESSION: No mammographic evidence of malignancy. A result letter of this
screening mammogram will be mailed directly to the patient.

RECOMMENDATION:
Screening mammogram in one year. (Code:CN-U-775)

BI-RADS CATEGORY  1: Negative.

## 2020-07-11 ENCOUNTER — Other Ambulatory Visit: Payer: Self-pay | Admitting: Internal Medicine

## 2020-07-15 DIAGNOSIS — H9313 Tinnitus, bilateral: Secondary | ICD-10-CM | POA: Diagnosis not present

## 2020-07-15 DIAGNOSIS — H838X3 Other specified diseases of inner ear, bilateral: Secondary | ICD-10-CM | POA: Diagnosis not present

## 2020-07-15 DIAGNOSIS — H903 Sensorineural hearing loss, bilateral: Secondary | ICD-10-CM | POA: Diagnosis not present

## 2020-07-16 NOTE — Progress Notes (Signed)
Carelink Summary Report / Loop Recorder 

## 2020-08-01 LAB — CUP PACEART REMOTE DEVICE CHECK
Date Time Interrogation Session: 20220526183526
Implantable Pulse Generator Implant Date: 20211109

## 2020-08-05 ENCOUNTER — Ambulatory Visit (INDEPENDENT_AMBULATORY_CARE_PROVIDER_SITE_OTHER): Payer: Medicare Other

## 2020-08-05 ENCOUNTER — Ambulatory Visit (INDEPENDENT_AMBULATORY_CARE_PROVIDER_SITE_OTHER): Payer: Medicare Other | Admitting: Internal Medicine

## 2020-08-05 ENCOUNTER — Other Ambulatory Visit: Payer: Self-pay

## 2020-08-05 DIAGNOSIS — R55 Syncope and collapse: Secondary | ICD-10-CM | POA: Diagnosis not present

## 2020-08-05 DIAGNOSIS — I4819 Other persistent atrial fibrillation: Secondary | ICD-10-CM

## 2020-08-05 DIAGNOSIS — I2510 Atherosclerotic heart disease of native coronary artery without angina pectoris: Secondary | ICD-10-CM | POA: Diagnosis not present

## 2020-08-05 NOTE — Progress Notes (Signed)
HPI [name] returns today for ongoing evaluation of syncope and PAF on dofetilide. The etiology of her syncope is unclear. She has undergone ILR insertion. She has had some documented bradycardia but no associated syncope.  Allergies  Allergen Reactions  . Fenofibrate Other (See Comments)    elevated LFTs  . Ondansetron Other (See Comments)    drug interaction     Current Outpatient Medications  Medication Sig Dispense Refill  . acetaminophen (TYLENOL) 650 MG CR tablet Take 1,300 mg by mouth every 8 (eight) hours as needed for pain.    . Ascorbic Acid (VITAMIN C) 1000 MG tablet Take 1,000 mg by mouth daily.     Marland Kitchen BIOTIN PO Take 10,000 Units by mouth daily with breakfast.     . Cholecalciferol (VITAMIN D-3) 1000 UNITS CAPS Take 1,000 Units by mouth daily with breakfast.     . cyanocobalamin 1000 MCG tablet Take 1,000 mcg by mouth daily.     Marland Kitchen dofetilide (TIKOSYN) 125 MCG capsule Take 1 capsule (125 mcg total) by mouth 2 (two) times daily. 60 capsule 11  . ELIQUIS 5 MG TABS tablet Take 1 tablet (5 mg total) by mouth 2 (two) times daily. 60 tablet 5  . esomeprazole (NEXIUM) 20 MG capsule Take 20 mg by mouth daily at 12 noon.    . ferrous sulfate 325 (65 FE) MG tablet Take 325 mg by mouth daily with breakfast.    . furosemide (LASIX) 40 MG tablet TAKE 1 TABLET(40 MG) BY MOUTH DAILY 90 tablet 1  . gabapentin (NEURONTIN) 100 MG capsule Take 100 mg by mouth 2 (two) times daily.    Marland Kitchen LORazepam (ATIVAN) 1 MG tablet Take 1 mg by mouth at bedtime.    Marland Kitchen losartan (COZAAR) 100 MG tablet Take 100 mg by mouth daily.     . magnesium oxide (MAG-OX) 400 (241.3 Mg) MG tablet Take 1 tablet (400 mg total) by mouth daily. 90 tablet 3  . Melatonin 10 MG TABS Take 1 tablet by mouth as needed (sleep).    . potassium chloride (KLOR-CON) 10 MEQ tablet Take 4 tablets (40 mEq total) by mouth daily. 120 tablet 6  . pyridOXINE (VITAMIN B-6) 100 MG tablet Take 100 mg by mouth daily.    . rosuvastatin  (CRESTOR) 40 MG tablet Take 1 tablet (40 mg total) by mouth daily at 6 PM. 30 tablet 6  . vitamin E 400 UNIT capsule Take 400 Units by mouth daily.     No current facility-administered medications for this visit.     Past Medical History:  Diagnosis Date  . Arthritis   . Atrial fibrillation ([place])   . Breast cancer ([place])   . Cancer ([place])    Breast- rt  . Cough with sputum 2016  . Elevated liver function tests 05/09/2013  . GERD (gastroesophageal reflux disease)    COSTOCHONDRITIS  . Heart murmur   . Hip pain 05/08/2014  . Hyperlipidemia   . Hypertension   . Lipoma    back of neck  . PONV (postoperative nausea and vomiting)    only after rt hip surgery  . Primary osteoarthritis of left knee 11/04/2015  . Primary osteoarthritis of right hip 07/30/2014  . Squamous cell carcinoma of skin 08/21/2019   atypical squa. proliferation-Left corner of mouth    ROS:   All systems reviewed and negative except as noted in the HPI.   Past Surgical History:  Procedure Laterality Date  . ANKLE FRACTURE  SURGERY Right 2013   RIGHT   . BREAST LUMPECTOMY Right 2011  . BREAST SURGERY  04/22/2009   Rt lumpectomy  . CARDIOVERSION N/A 02/19/2019   Procedure: CARDIOVERSION;  Surgeon: Dorothy Spark, MD;  [facility] ENDOSCOPY;  Service: Cardiovascular;  Laterality: N/A;  . CORONARY ATHERECTOMY N/A 04/30/2019   Procedure: CORONARY ATHERECTOMY;  Surgeon: Belva Crome, MD;  Location: [place] CV LAB;  Service: Cardiovascular;  Laterality: N/A;  LAD  . CORONARY STENT INTERVENTION N/A 04/30/2019   Procedure: CORONARY STENT INTERVENTION;  Surgeon: Belva Crome, MD;  Location: [place] CV LAB;  Service: Cardiovascular;  Laterality: N/A;  DES TO PROX-MID LAD  . EYE SURGERY  2001   macular hole  . JOINT REPLACEMENT  2016  . KNEE SURGERY    . LEFT HEART CATH AND CORONARY ANGIOGRAPHY N/A 04/30/2019   Procedure: LEFT HEART CATH AND CORONARY ANGIOGRAPHY;  Surgeon: Belva Crome, MD;  Location:  [place] CV LAB;  Service: Cardiovascular;  Laterality: N/A;  . MIDDLE EAR SURGERY Left 08/18/2015   repair eardrum and reconstruction  . OVARY SURGERY  40 years ago   wedge  . TOTAL HIP ARTHROPLASTY Right 07/30/2014   Procedure: TOTAL HIP ARTHROPLASTY ANTERIOR APPROACH;  Surgeon: Melrose Nakayama, MD;  Location: [facility];  Service: Orthopedics;  Laterality: Right;  . TOTAL KNEE ARTHROPLASTY Left 11/04/2015  . TOTAL KNEE ARTHROPLASTY Left 11/04/2015   Procedure: TOTAL KNEE ARTHROPLASTY;  Surgeon: Melrose Nakayama, MD;  Location: [facility];  Service: Orthopedics;  Laterality: Left;  . TYMPANOPLASTY  30 years ago     Family History  Problem Relation Age of Onset  . Heart disease Mother   . Cancer Brother        colon, pancreatic, brain     Social History   Socioeconomic History  . Marital status: Widowed    Spouse name: Not on file  . Number of children: Not on file  . Years of education: Not on file  . Highest education level: Not on file  Occupational History  . Not on file  Tobacco Use  . Smoking status: Never Smoker  . Smokeless tobacco: Never Used  Vaping Use  . Vaping Use: Never used  Substance and Sexual Activity  . Alcohol use: Not Currently  . Drug use: No  . Sexual activity: Not Currently    Birth control/protection: None  Other Topics Concern  . Not on file  Social History Narrative  . Not on file   Social Determinants of Health   Financial Resource Strain: Not on file  Food Insecurity: Not on file  Transportation Needs: Not on file  Physical Activity: Not on file  Stress: Not on file  Social Connections: Not on file  Intimate Partner Violence: Not on file    BP - 126/78, P -64 Physical Exam:  Well appearing NAD HEENT: Unremarkable Neck:  No JVD, no thyromegally Lymphatics:  No adenopathy Back:  No CVA tenderness Lungs:  Clear HEART:  Regular rate rhythm, no murmurs, no rubs, no clicks Abd:  soft, positive bowel sounds, no organomegally, no rebound,  no guarding Ext:  2 plus pulses, no edema, no cyanosis, no clubbing Skin:  No rashes no nodules Neuro:  CN II through XII intact, motor grossly intact  DEVICE  Normal device function.  See PaceArt for details.   Assess/Plan: 1. Unexplained syncope - no explanation from her ILR. She will undergo watchful waiting. 2. PAF - she is minimally symptomatic. She will  continue her eliquis.  3. Sinus node dysfunction - she has had episodes of bradycardia into the 40s but asymptomatic.  4. HTN - her bp is controlled. She is encouraged to avoid salty foods.   Carleene Overlie Tyshaun Vinzant,MD

## 2020-08-05 NOTE — Patient Instructions (Signed)
Medication Instructions:  Your physician recommends that you continue on your current medications as directed. Please refer to the Current Medication list given to you today.  Labwork: None ordered.  Testing/Procedures: None ordered.  Follow-Up: Your physician wants you to follow-up in: one year with Cristopher Peru, MD or one of the following Advanced Practice Providers on your designated Care Team:    Chanetta Marshall, NP  Tommye Standard, PA-C  Legrand Como "Jonni Sanger" [place], [place]  Remote monitoring is used to monitor your loop recorder from home.   Any Other Special Instructions Will Be Listed Below (If Applicable).  If you need a refill on your cardiac medications before your next appointment, please call your pharmacy.

## 2020-08-12 ENCOUNTER — Other Ambulatory Visit: Payer: Self-pay | Admitting: *Deleted

## 2020-08-12 MED ORDER — ELIQUIS 5 MG PO TABS
5.0000 mg | ORAL_TABLET | Freq: Two times a day (BID) | ORAL | 5 refills | Status: DC
Start: 2020-08-12 — End: 2021-02-26

## 2020-08-12 NOTE — Telephone Encounter (Signed)
Prescription refill request for Eliquis received. Indication: afib  Last office visit: Sheila Gardner 08/05/2020 Scr: 0.71, 11/06/2019 Age: 85 yo  Weight: 84.6 kg   Pt is on the correct dose of Eliquis per dosing criteria, prescription refill sent for Elquis 5mg  bid.

## 2020-08-27 NOTE — Progress Notes (Signed)
Carelink Summary Report / Loop Recorder 

## 2020-08-29 DIAGNOSIS — H31091 Other chorioretinal scars, right eye: Secondary | ICD-10-CM | POA: Diagnosis not present

## 2020-08-29 DIAGNOSIS — H35372 Puckering of macula, left eye: Secondary | ICD-10-CM | POA: Diagnosis not present

## 2020-08-29 DIAGNOSIS — H43392 Other vitreous opacities, left eye: Secondary | ICD-10-CM | POA: Diagnosis not present

## 2020-08-29 DIAGNOSIS — H43812 Vitreous degeneration, left eye: Secondary | ICD-10-CM | POA: Diagnosis not present

## 2020-08-29 DIAGNOSIS — D3132 Benign neoplasm of left choroid: Secondary | ICD-10-CM | POA: Diagnosis not present

## 2020-09-03 LAB — CUP PACEART REMOTE DEVICE CHECK
Date Time Interrogation Session: 20220628183720
Implantable Pulse Generator Implant Date: 20211109

## 2020-09-05 ENCOUNTER — Ambulatory Visit (INDEPENDENT_AMBULATORY_CARE_PROVIDER_SITE_OTHER): Payer: Medicare Other

## 2020-09-05 DIAGNOSIS — R55 Syncope and collapse: Secondary | ICD-10-CM

## 2020-09-05 DIAGNOSIS — I4819 Other persistent atrial fibrillation: Secondary | ICD-10-CM

## 2020-09-10 ENCOUNTER — Other Ambulatory Visit: Payer: Self-pay | Admitting: Internal Medicine

## 2020-09-23 DIAGNOSIS — H903 Sensorineural hearing loss, bilateral: Secondary | ICD-10-CM | POA: Diagnosis not present

## 2020-09-23 DIAGNOSIS — H9313 Tinnitus, bilateral: Secondary | ICD-10-CM | POA: Diagnosis not present

## 2020-09-24 NOTE — Progress Notes (Signed)
Carelink Summary Report / Loop Recorder 

## 2020-10-07 LAB — CUP PACEART REMOTE DEVICE CHECK
Date Time Interrogation Session: 20220731183331
Implantable Pulse Generator Implant Date: 20211109

## 2020-10-08 ENCOUNTER — Ambulatory Visit (INDEPENDENT_AMBULATORY_CARE_PROVIDER_SITE_OTHER): Payer: Medicare Other

## 2020-10-08 DIAGNOSIS — I4819 Other persistent atrial fibrillation: Secondary | ICD-10-CM | POA: Diagnosis not present

## 2020-10-20 DIAGNOSIS — Z87440 Personal history of urinary (tract) infections: Secondary | ICD-10-CM | POA: Diagnosis not present

## 2020-10-31 DIAGNOSIS — H43812 Vitreous degeneration, left eye: Secondary | ICD-10-CM | POA: Diagnosis not present

## 2020-10-31 DIAGNOSIS — H43392 Other vitreous opacities, left eye: Secondary | ICD-10-CM | POA: Diagnosis not present

## 2020-10-31 DIAGNOSIS — H35342 Macular cyst, hole, or pseudohole, left eye: Secondary | ICD-10-CM | POA: Diagnosis not present

## 2020-10-31 DIAGNOSIS — H31091 Other chorioretinal scars, right eye: Secondary | ICD-10-CM | POA: Diagnosis not present

## 2020-10-31 DIAGNOSIS — D3132 Benign neoplasm of left choroid: Secondary | ICD-10-CM | POA: Diagnosis not present

## 2020-10-31 DIAGNOSIS — H35372 Puckering of macula, left eye: Secondary | ICD-10-CM | POA: Diagnosis not present

## 2020-11-03 NOTE — Progress Notes (Signed)
Carelink Summary Report / Loop Recorder 

## 2020-11-06 DIAGNOSIS — H02411 Mechanical ptosis of right eyelid: Secondary | ICD-10-CM | POA: Diagnosis not present

## 2020-11-06 DIAGNOSIS — H02834 Dermatochalasis of left upper eyelid: Secondary | ICD-10-CM | POA: Diagnosis not present

## 2020-11-06 DIAGNOSIS — H02412 Mechanical ptosis of left eyelid: Secondary | ICD-10-CM | POA: Diagnosis not present

## 2020-11-06 DIAGNOSIS — H02413 Mechanical ptosis of bilateral eyelids: Secondary | ICD-10-CM | POA: Diagnosis not present

## 2020-11-06 DIAGNOSIS — K219 Gastro-esophageal reflux disease without esophagitis: Secondary | ICD-10-CM | POA: Insufficient documentation

## 2020-11-06 DIAGNOSIS — H02831 Dermatochalasis of right upper eyelid: Secondary | ICD-10-CM | POA: Diagnosis not present

## 2020-11-06 DIAGNOSIS — H0279 Other degenerative disorders of eyelid and periocular area: Secondary | ICD-10-CM | POA: Diagnosis not present

## 2020-11-06 DIAGNOSIS — H53483 Generalized contraction of visual field, bilateral: Secondary | ICD-10-CM | POA: Diagnosis not present

## 2020-11-06 DIAGNOSIS — H57813 Brow ptosis, bilateral: Secondary | ICD-10-CM | POA: Diagnosis not present

## 2020-11-11 ENCOUNTER — Ambulatory Visit (INDEPENDENT_AMBULATORY_CARE_PROVIDER_SITE_OTHER): Payer: Medicare Other

## 2020-11-11 DIAGNOSIS — I4819 Other persistent atrial fibrillation: Secondary | ICD-10-CM | POA: Diagnosis not present

## 2020-11-11 DIAGNOSIS — R55 Syncope and collapse: Secondary | ICD-10-CM

## 2020-11-12 LAB — CUP PACEART REMOTE DEVICE CHECK
Date Time Interrogation Session: 20220902183535
Implantable Pulse Generator Implant Date: 20211109

## 2020-11-19 NOTE — Progress Notes (Signed)
Carelink Summary Report / Loop Recorder 

## 2020-11-25 ENCOUNTER — Telehealth: Payer: Self-pay | Admitting: Interventional Cardiology

## 2020-11-25 NOTE — Telephone Encounter (Signed)
° ° °  Patient calling the office for samples of medication: ° ° °1.  What medication and dosage are you requesting samples for? °ELIQUIS 5 MG TABS tablet ° °2.  Are you currently out of this medication? yes ° ° °

## 2020-11-25 NOTE — Telephone Encounter (Signed)
Patient presents to office requesting samples. No none left. Is in coverage gap. She was given 2 weeks of samples 5mg  BID and given info on pt assistance.

## 2020-11-26 ENCOUNTER — Telehealth: Payer: Self-pay

## 2020-11-26 NOTE — Telephone Encounter (Signed)
**Note De-Identified Sheila Gardner Obfuscation** The pt states that she is in her donut hole and cannot afford her Eliquis. I gave her [employer] phone number and reminded her of how we handled her application last year.  She is aware that once she receives her [employer] application for Eliquis in the mail to complete her part, obtain required documents per [employer], and to bring all to the office to drop off and that we will take care of the providers page of her application and will fax all to [employer].  She verbalized understanding and thanked me for taking her call.

## 2020-11-28 DIAGNOSIS — I10 Essential (primary) hypertension: Secondary | ICD-10-CM | POA: Diagnosis not present

## 2020-11-28 DIAGNOSIS — D6859 Other primary thrombophilia: Secondary | ICD-10-CM | POA: Diagnosis not present

## 2020-11-28 DIAGNOSIS — E1169 Type 2 diabetes mellitus with other specified complication: Secondary | ICD-10-CM | POA: Diagnosis not present

## 2020-11-28 DIAGNOSIS — E782 Mixed hyperlipidemia: Secondary | ICD-10-CM | POA: Diagnosis not present

## 2020-11-28 DIAGNOSIS — E559 Vitamin D deficiency, unspecified: Secondary | ICD-10-CM | POA: Diagnosis not present

## 2020-11-28 NOTE — Telephone Encounter (Signed)
**Note De-Identified Jadie Comas Obfuscation** No answer so I left a VM asking the pt to call Jeani Hawking back at Dr Darliss Ridgel office at [facility] [phone].

## 2020-11-28 NOTE — Telephone Encounter (Signed)
**Note De-Identified Lakya Schrupp Obfuscation** The pt left her completed [employer] application (I have not seen this version of their application) for Eliquis along with 45 pages of documents at the office.  I have gone through her application and removed all 2020 proof of income as it is not needed and kept the 1 page for 2021 as it is needed. I completed the provider page of the application and will email all to Dr Thompson Caul nurse so she can obtain his signature, date it, and so she can fax all to [employer] at the fax number written on the cover letter included or to place in the to be faxed basket in Medical Records to be faxed.  Prior to e-mailing the application to Dr Darliss Ridgel nurse I called the pt but got no answer so I left a message on her VM asking her to call Jeani Hawking at Dr Smith/Taylor's office at [facility] at [phone].  The BMS[employer] application that the pt left at the office looks like an old version and if so [employer] will not accept it. I will discuss with the pt when she calls me back.

## 2020-11-28 NOTE — Telephone Encounter (Signed)
Patient returning call.

## 2020-12-01 NOTE — Telephone Encounter (Signed)
**Note De-Identified Sheila Gardner Obfuscation** The pt and I discussed the BMSPA[employer] application that she dropped off at the office as it is an old version and [employer] will not accept it.  Per her request I am using the signature portion of the application that she left at the office last week but I am completing a current application with her information.  I have also completed the provide page of the application and have emailed all to Dr Thompson Caul nurse so she can obtain his signature, date it, and to fax all to [facility] at the fax number written on the cover letter included or to place in the to be faxed basket I medical records to be faxed.

## 2020-12-01 NOTE — Telephone Encounter (Signed)
Paperwork completed and faxed.  °

## 2020-12-02 DIAGNOSIS — K219 Gastro-esophageal reflux disease without esophagitis: Secondary | ICD-10-CM | POA: Diagnosis not present

## 2020-12-02 DIAGNOSIS — F411 Generalized anxiety disorder: Secondary | ICD-10-CM | POA: Diagnosis not present

## 2020-12-02 DIAGNOSIS — M19049 Primary osteoarthritis, unspecified hand: Secondary | ICD-10-CM | POA: Diagnosis not present

## 2020-12-02 DIAGNOSIS — R490 Dysphonia: Secondary | ICD-10-CM | POA: Diagnosis not present

## 2020-12-02 DIAGNOSIS — Z1389 Encounter for screening for other disorder: Secondary | ICD-10-CM | POA: Diagnosis not present

## 2020-12-02 DIAGNOSIS — I10 Essential (primary) hypertension: Secondary | ICD-10-CM | POA: Diagnosis not present

## 2020-12-02 DIAGNOSIS — E559 Vitamin D deficiency, unspecified: Secondary | ICD-10-CM | POA: Diagnosis not present

## 2020-12-02 DIAGNOSIS — Z23 Encounter for immunization: Secondary | ICD-10-CM | POA: Diagnosis not present

## 2020-12-02 DIAGNOSIS — E1169 Type 2 diabetes mellitus with other specified complication: Secondary | ICD-10-CM | POA: Diagnosis not present

## 2020-12-02 DIAGNOSIS — E782 Mixed hyperlipidemia: Secondary | ICD-10-CM | POA: Diagnosis not present

## 2020-12-02 DIAGNOSIS — D6869 Other thrombophilia: Secondary | ICD-10-CM | POA: Diagnosis not present

## 2020-12-02 DIAGNOSIS — Z0000 Encounter for general adult medical examination without abnormal findings: Secondary | ICD-10-CM | POA: Diagnosis not present

## 2020-12-04 DIAGNOSIS — D3132 Benign neoplasm of left choroid: Secondary | ICD-10-CM | POA: Diagnosis not present

## 2020-12-04 DIAGNOSIS — Z961 Presence of intraocular lens: Secondary | ICD-10-CM | POA: Diagnosis not present

## 2020-12-04 DIAGNOSIS — H40013 Open angle with borderline findings, low risk, bilateral: Secondary | ICD-10-CM | POA: Diagnosis not present

## 2020-12-04 DIAGNOSIS — H5203 Hypermetropia, bilateral: Secondary | ICD-10-CM | POA: Diagnosis not present

## 2020-12-04 DIAGNOSIS — H52223 Regular astigmatism, bilateral: Secondary | ICD-10-CM | POA: Diagnosis not present

## 2020-12-04 DIAGNOSIS — H524 Presbyopia: Secondary | ICD-10-CM | POA: Diagnosis not present

## 2020-12-04 DIAGNOSIS — H40053 Ocular hypertension, bilateral: Secondary | ICD-10-CM | POA: Diagnosis not present

## 2020-12-04 DIAGNOSIS — H43392 Other vitreous opacities, left eye: Secondary | ICD-10-CM | POA: Diagnosis not present

## 2020-12-04 DIAGNOSIS — H353131 Nonexudative age-related macular degeneration, bilateral, early dry stage: Secondary | ICD-10-CM | POA: Diagnosis not present

## 2020-12-04 DIAGNOSIS — H3540 Unspecified peripheral retinal degeneration: Secondary | ICD-10-CM | POA: Diagnosis not present

## 2020-12-04 DIAGNOSIS — H33301 Unspecified retinal break, right eye: Secondary | ICD-10-CM | POA: Diagnosis not present

## 2020-12-04 DIAGNOSIS — H3530 Unspecified macular degeneration: Secondary | ICD-10-CM | POA: Diagnosis not present

## 2020-12-07 ENCOUNTER — Other Ambulatory Visit: Payer: Self-pay

## 2020-12-07 ENCOUNTER — Encounter (HOSPITAL_COMMUNITY): Payer: Self-pay | Admitting: *Deleted

## 2020-12-07 ENCOUNTER — Emergency Department (HOSPITAL_COMMUNITY): Payer: Medicare Other

## 2020-12-07 ENCOUNTER — Inpatient Hospital Stay (HOSPITAL_COMMUNITY)
Admission: EM | Admit: 2020-12-07 | Discharge: 2020-12-10 | DRG: 281 | Disposition: A | Discharge status: Home / Self Care | Payer: Medicare Other | Attending: Cardiology | Admitting: Cardiology

## 2020-12-07 DIAGNOSIS — E7800 Pure hypercholesterolemia, unspecified: Secondary | ICD-10-CM | POA: Diagnosis not present

## 2020-12-07 DIAGNOSIS — I214 Non-ST elevation (NSTEMI) myocardial infarction: Principal | ICD-10-CM | POA: Diagnosis present

## 2020-12-07 DIAGNOSIS — I2583 Coronary atherosclerosis due to lipid rich plaque: Secondary | ICD-10-CM | POA: Diagnosis not present

## 2020-12-07 DIAGNOSIS — Z8249 Family history of ischemic heart disease and other diseases of the circulatory system: Secondary | ICD-10-CM | POA: Diagnosis not present

## 2020-12-07 DIAGNOSIS — Z96641 Presence of right artificial hip joint: Secondary | ICD-10-CM | POA: Diagnosis present

## 2020-12-07 DIAGNOSIS — Z79899 Other long term (current) drug therapy: Secondary | ICD-10-CM

## 2020-12-07 DIAGNOSIS — R778 Other specified abnormalities of plasma proteins: Secondary | ICD-10-CM

## 2020-12-07 DIAGNOSIS — K219 Gastro-esophageal reflux disease without esophagitis: Secondary | ICD-10-CM | POA: Diagnosis present

## 2020-12-07 DIAGNOSIS — Z853 Personal history of malignant neoplasm of breast: Secondary | ICD-10-CM

## 2020-12-07 DIAGNOSIS — I160 Hypertensive urgency: Secondary | ICD-10-CM | POA: Diagnosis not present

## 2020-12-07 DIAGNOSIS — E876 Hypokalemia: Secondary | ICD-10-CM | POA: Diagnosis present

## 2020-12-07 DIAGNOSIS — Z888 Allergy status to other drugs, medicaments and biological substances status: Secondary | ICD-10-CM

## 2020-12-07 DIAGNOSIS — R011 Cardiac murmur, unspecified: Secondary | ICD-10-CM | POA: Diagnosis present

## 2020-12-07 DIAGNOSIS — Z96652 Presence of left artificial knee joint: Secondary | ICD-10-CM | POA: Diagnosis present

## 2020-12-07 DIAGNOSIS — E119 Type 2 diabetes mellitus without complications: Secondary | ICD-10-CM | POA: Diagnosis present

## 2020-12-07 DIAGNOSIS — E785 Hyperlipidemia, unspecified: Secondary | ICD-10-CM | POA: Diagnosis present

## 2020-12-07 DIAGNOSIS — Z809 Family history of malignant neoplasm, unspecified: Secondary | ICD-10-CM | POA: Diagnosis not present

## 2020-12-07 DIAGNOSIS — E871 Hypo-osmolality and hyponatremia: Secondary | ICD-10-CM | POA: Diagnosis present

## 2020-12-07 DIAGNOSIS — G4489 Other headache syndrome: Secondary | ICD-10-CM | POA: Diagnosis not present

## 2020-12-07 DIAGNOSIS — I452 Bifascicular block: Secondary | ICD-10-CM | POA: Diagnosis not present

## 2020-12-07 DIAGNOSIS — I083 Combined rheumatic disorders of mitral, aortic and tricuspid valves: Secondary | ICD-10-CM | POA: Diagnosis not present

## 2020-12-07 DIAGNOSIS — I4819 Other persistent atrial fibrillation: Secondary | ICD-10-CM | POA: Diagnosis present

## 2020-12-07 DIAGNOSIS — Z85828 Personal history of other malignant neoplasm of skin: Secondary | ICD-10-CM | POA: Diagnosis not present

## 2020-12-07 DIAGNOSIS — Z7902 Long term (current) use of antithrombotics/antiplatelets: Secondary | ICD-10-CM

## 2020-12-07 DIAGNOSIS — R079 Chest pain, unspecified: Secondary | ICD-10-CM

## 2020-12-07 DIAGNOSIS — I480 Paroxysmal atrial fibrillation: Secondary | ICD-10-CM | POA: Diagnosis not present

## 2020-12-07 DIAGNOSIS — Z20822 Contact with and (suspected) exposure to covid-19: Secondary | ICD-10-CM | POA: Diagnosis present

## 2020-12-07 DIAGNOSIS — I495 Sick sinus syndrome: Secondary | ICD-10-CM | POA: Diagnosis present

## 2020-12-07 DIAGNOSIS — I25119 Atherosclerotic heart disease of native coronary artery with unspecified angina pectoris: Secondary | ICD-10-CM | POA: Diagnosis not present

## 2020-12-07 DIAGNOSIS — Z955 Presence of coronary angioplasty implant and graft: Secondary | ICD-10-CM

## 2020-12-07 DIAGNOSIS — Z7901 Long term (current) use of anticoagulants: Secondary | ICD-10-CM | POA: Diagnosis not present

## 2020-12-07 DIAGNOSIS — Z7982 Long term (current) use of aspirin: Secondary | ICD-10-CM

## 2020-12-07 DIAGNOSIS — I10 Essential (primary) hypertension: Secondary | ICD-10-CM | POA: Diagnosis not present

## 2020-12-07 DIAGNOSIS — R0789 Other chest pain: Secondary | ICD-10-CM

## 2020-12-07 DIAGNOSIS — I2510 Atherosclerotic heart disease of native coronary artery without angina pectoris: Secondary | ICD-10-CM | POA: Diagnosis not present

## 2020-12-07 DIAGNOSIS — R110 Nausea: Secondary | ICD-10-CM | POA: Diagnosis not present

## 2020-12-07 DIAGNOSIS — I248 Other forms of acute ischemic heart disease: Secondary | ICD-10-CM | POA: Diagnosis not present

## 2020-12-07 DIAGNOSIS — R5381 Other malaise: Secondary | ICD-10-CM | POA: Diagnosis not present

## 2020-12-07 LAB — TROPONIN I (HIGH SENSITIVITY)
Troponin I (High Sensitivity): 37 ng/L — ABNORMAL HIGH (ref <18)
Troponin I (High Sensitivity): 170 ng/L (ref <18)
Troponin I (High Sensitivity): 308 ng/L (ref <18)
Troponin I (High Sensitivity): 321 ng/L (ref <18)

## 2020-12-07 LAB — RESP PANEL BY RT-PCR (FLU A&B, COVID) ARPGX2
Influenza A by PCR: NEGATIVE
Influenza B by PCR: NEGATIVE
SARS Coronavirus 2 by RT PCR: NEGATIVE

## 2020-12-07 LAB — ECHOCARDIOGRAM COMPLETE
Area-P 1/2: 2.77 cm2
Height: 65 in
S' Lateral: 3.4 cm
Weight: 2984.15 oz

## 2020-12-07 LAB — CBC
HCT: 33.8 % — ABNORMAL LOW (ref 36.0–46.0)
Hemoglobin: 11.4 g/dL — ABNORMAL LOW (ref 12.0–15.0)
MCH: 30.6 pg (ref 26.0–34.0)
MCHC: 33.7 g/dL (ref 30.0–36.0)
MCV: 90.6 fL (ref 80.0–100.0)
Platelets: 224 10*3/uL (ref 150–400)
RBC: 3.73 MIL/uL — ABNORMAL LOW (ref 3.87–5.11)
RDW: 12.6 % (ref 11.5–15.5)
WBC: 7.3 10*3/uL (ref 4.0–10.5)
nRBC: 0 % (ref 0.0–0.2)

## 2020-12-07 LAB — CBG MONITORING, ED: Glucose-Capillary: 119 mg/dL — ABNORMAL HIGH (ref 70–99)

## 2020-12-07 LAB — BASIC METABOLIC PANEL
Anion gap: 9 (ref 5–15)
BUN: 11 mg/dL (ref 8–23)
CO2: 25 mmol/L (ref 22–32)
Calcium: 9.6 mg/dL (ref 8.9–10.3)
Chloride: 96 mmol/L — ABNORMAL LOW (ref 98–111)
Creatinine, Ser: 0.65 mg/dL (ref 0.44–1.00)
GFR, Estimated: >60 mL/min (ref >60)
Glucose, Bld: 115 mg/dL — ABNORMAL HIGH (ref 70–99)
Potassium: 4.1 mmol/L (ref 3.5–5.1)
Sodium: 130 mmol/L — ABNORMAL LOW (ref 135–145)

## 2020-12-07 LAB — HEPARIN LEVEL (UNFRACTIONATED): Heparin Unfractionated: >1.1 IU/mL — ABNORMAL HIGH (ref 0.30–0.70)

## 2020-12-07 LAB — APTT
aPTT: 34 seconds (ref 24–36)
aPTT: 107 seconds — ABNORMAL HIGH (ref 24–36)

## 2020-12-07 LAB — PROTIME-INR
INR: 1.1 (ref 0.8–1.2)
Prothrombin Time: 14.4 seconds (ref 11.4–15.2)

## 2020-12-07 LAB — MAGNESIUM: Magnesium: 1.9 mg/dL (ref 1.7–2.4)

## 2020-12-07 MED ORDER — DOFETILIDE 125 MCG PO CAPS
125.0000 ug | ORAL_CAPSULE | Freq: Two times a day (BID) | ORAL | Status: DC
  Administered 2020-12-07 – 2020-12-10 (×7): 125 ug via ORAL
  Filled 2020-12-07 (×8): qty 1

## 2020-12-07 MED ORDER — HEPARIN (PORCINE) 25000 UT/250ML-% IV SOLN
1050.0000 [IU]/h | INTRAVENOUS | Status: DC
Start: 2020-12-07 — End: 2020-12-08
  Administered 2020-12-07: 1150 [IU]/h via INTRAVENOUS
  Administered 2020-12-08: 1050 [IU]/h via INTRAVENOUS
  Filled 2020-12-07 (×2): qty 250

## 2020-12-07 MED ORDER — MAGNESIUM OXIDE -MG SUPPLEMENT 400 (240 MG) MG PO TABS
400.0000 mg | ORAL_TABLET | Freq: Every day | ORAL | Status: DC
  Administered 2020-12-07 – 2020-12-10 (×4): 400 mg via ORAL
  Filled 2020-12-07 (×4): qty 1

## 2020-12-07 MED ORDER — ACETAMINOPHEN 325 MG PO TABS
650.0000 mg | ORAL_TABLET | ORAL | Status: DC | PRN
  Administered 2020-12-07 – 2020-12-08 (×3): 650 mg via ORAL
  Filled 2020-12-07 (×3): qty 2

## 2020-12-07 MED ORDER — AMLODIPINE BESYLATE 5 MG PO TABS
5.0000 mg | ORAL_TABLET | Freq: Every day | ORAL | Status: DC
  Administered 2020-12-07 – 2020-12-09 (×3): 5 mg via ORAL
  Filled 2020-12-07 (×3): qty 1

## 2020-12-07 MED ORDER — NITROGLYCERIN 2 % TD OINT
1.0000 [in_us] | TOPICAL_OINTMENT | Freq: Once | TRANSDERMAL | Status: AC
  Administered 2020-12-07: 1 [in_us] via TOPICAL
  Filled 2020-12-07: qty 1

## 2020-12-07 MED ORDER — ROSUVASTATIN CALCIUM 20 MG PO TABS
40.0000 mg | ORAL_TABLET | Freq: Every day | ORAL | Status: DC
  Administered 2020-12-07 – 2020-12-09 (×3): 40 mg via ORAL
  Filled 2020-12-07 (×3): qty 2

## 2020-12-07 MED ORDER — IRBESARTAN 300 MG PO TABS
300.0000 mg | ORAL_TABLET | Freq: Every day | ORAL | Status: DC
  Administered 2020-12-07 – 2020-12-10 (×4): 300 mg via ORAL
  Filled 2020-12-07 (×4): qty 1

## 2020-12-07 MED ORDER — FERROUS SULFATE 325 (65 FE) MG PO TABS
325.0000 mg | ORAL_TABLET | Freq: Every day | ORAL | Status: DC
  Administered 2020-12-08 – 2020-12-10 (×3): 325 mg via ORAL
  Filled 2020-12-07 (×3): qty 1

## 2020-12-07 MED ORDER — PANTOPRAZOLE SODIUM 40 MG PO TBEC
40.0000 mg | DELAYED_RELEASE_TABLET | Freq: Every day | ORAL | Status: DC
  Administered 2020-12-07 – 2020-12-10 (×4): 40 mg via ORAL
  Filled 2020-12-07 (×4): qty 1

## 2020-12-07 MED ORDER — NITROGLYCERIN 0.4 MG SL SUBL: 0.4000 mg | SUBLINGUAL_TABLET | SUBLINGUAL | Status: DC | PRN

## 2020-12-07 MED ORDER — ASPIRIN 325 MG PO TABS
325.0000 mg | ORAL_TABLET | Freq: Every day | ORAL | Status: DC
  Administered 2020-12-07: 325 mg via ORAL
  Filled 2020-12-07: qty 1

## 2020-12-07 MED ORDER — ASPIRIN 81 MG PO CHEW
81.0000 mg | CHEWABLE_TABLET | Freq: Every day | ORAL | Status: DC
  Administered 2020-12-07 – 2020-12-10 (×4): 81 mg via ORAL
  Filled 2020-12-07 (×4): qty 1

## 2020-12-07 MED ORDER — LORAZEPAM 0.5 MG PO TABS
0.5000 mg | ORAL_TABLET | Freq: Every evening | ORAL | Status: DC | PRN
  Administered 2020-12-07 – 2020-12-09 (×3): 0.5 mg via ORAL
  Filled 2020-12-07 (×3): qty 1

## 2020-12-07 NOTE — ED Provider Notes (Signed)
[facility] EMERGENCY DEPARTMENT Provider Note   CSN: [id number] Arrival date & time: 12/07/20  0036     History Chief Complaint  Patient presents with   Chest Pain    Sheila Gardner is a 85 y.o. female.  HPI     85 year old female with a history of atrial fibrillation on Eliquis, coronary artery disease who presents with chest pain.  Patient reports intermittent chest pain and nausea that started yesterday.  She states she just "did not feel well."  She took her blood pressure at home and noted it to be "very high."  She reported associated nausea and sweating.  She googled her symptoms and began to walk thinking that this would help her blood pressure.  She states that after walking she "just felt horrible."  Cannot tell me whether her chest pain got better or worse during that time.  Has not recently had any fevers or cough.  Currently she is without pain.  Patient follows with Dr. [name] cardiology and Dr. Lovena Le electrophysiology.  Cardiac cath from 2021 with stent placement in LAD.  She did have several other partially occlusive lesions.  Past Medical History:  Diagnosis Date   Arthritis    Atrial fibrillation (HCC)    Breast cancer ([place])    Cancer ([place])    Breast- rt   Cough with sputum 2016   Elevated liver function tests 05/09/2013   GERD (gastroesophageal reflux disease)    COSTOCHONDRITIS   Heart murmur    Hip pain 05/08/2014   Hyperlipidemia    Hypertension    Lipoma    back of neck   PONV (postoperative nausea and vomiting)    only after rt hip surgery   Primary osteoarthritis of left knee 11/04/2015   Primary osteoarthritis of right hip 07/30/2014   Squamous cell carcinoma of skin 08/21/2019   atypical squa. proliferation-Left corner of mouth    Patient Active Problem List   Diagnosis Date Noted   Dizziness 01/11/2020   Syncope 09/19/2019   Secondary hypercoagulable state ([place]) 06/15/2019   Persistent atrial fibrillation ([place]) 06/11/2019    CAD (coronary artery disease) 04/30/2019   CAD (coronary artery disease), native coronary artery 04/28/2019   Hyperlipidemia LDL goal <70 04/28/2019   Hypertension 04/28/2019   Primary osteoarthritis of left knee 11/04/2015   Primary osteoarthritis of right hip 07/30/2014   Hip pain 05/08/2014   Elevated liver function tests 05/09/2013   Vitamin D deficiency 03/25/2011   Malignant neoplasm of upper-outer quadrant of right breast in female, estrogen receptor positive ([place]) 12/30/2010    Past Surgical History:  Procedure Laterality Date   ANKLE FRACTURE SURGERY Right 2013   RIGHT    BREAST LUMPECTOMY Right 2011   BREAST SURGERY  04/22/2009   Rt lumpectomy   CARDIOVERSION N/A 02/19/2019   Procedure: CARDIOVERSION;  Surgeon: Dorothy Spark, MD;  Location: [place];  Service: Cardiovascular;  Laterality: N/A;   CORONARY ATHERECTOMY N/A 04/30/2019   Procedure: CORONARY ATHERECTOMY;  Surgeon: Belva Crome, MD;  Location: [place] CV LAB;  Service: Cardiovascular;  Laterality: N/A;  LAD   CORONARY STENT INTERVENTION N/A 04/30/2019   Procedure: CORONARY STENT INTERVENTION;  Surgeon: Belva Crome, MD;  Location: [place] CV LAB;  Service: Cardiovascular;  Laterality: N/A;  DES TO PROX-MID LAD   EYE SURGERY  2001   macular hole   JOINT REPLACEMENT  2016   KNEE SURGERY     LEFT HEART CATH AND CORONARY ANGIOGRAPHY  N/A 04/30/2019   Procedure: LEFT HEART CATH AND CORONARY ANGIOGRAPHY;  Surgeon: Belva Crome, MD;  Location: [place] CV LAB;  Service: Cardiovascular;  Laterality: N/A;   MIDDLE EAR SURGERY Left 08/18/2015   repair eardrum and reconstruction   OVARY SURGERY  40 years ago   wedge   TOTAL HIP ARTHROPLASTY Right 07/30/2014   Procedure: TOTAL HIP ARTHROPLASTY ANTERIOR APPROACH;  Surgeon: Melrose Nakayama, MD;  Location: [place];  Service: Orthopedics;  Laterality: Right;   TOTAL KNEE ARTHROPLASTY Left 11/04/2015   TOTAL KNEE ARTHROPLASTY Left 11/04/2015   Procedure:  TOTAL KNEE ARTHROPLASTY;  Surgeon: Melrose Nakayama, MD;  Location: [place];  Service: Orthopedics;  Laterality: Left;   TYMPANOPLASTY  30 years ago     OB History   No obstetric history on file.     Family History  Problem Relation Age of Onset   Heart disease Mother    Cancer Brother        colon, pancreatic, brain    Social History   Tobacco Use   Smoking status: Never   Smokeless tobacco: Never  Vaping Use   Vaping Use: Never used  Substance Use Topics   Alcohol use: Not Currently   Drug use: No    Home Medications Prior to Admission medications   Medication Sig Start Date End Date Taking? Authorizing Provider  acetaminophen (TYLENOL) 650 MG CR tablet Take 1,300 mg by mouth every 8 (eight) hours as needed for pain.   Yes [provider]  Ascorbic Acid (VITAMIN C) 1000 MG tablet Take 1,000 mg by mouth daily.    Yes [provider]  BIOTIN PO Take 10,000 Units by mouth daily with breakfast.    Yes [provider]  Cholecalciferol (VITAMIN D-3) 1000 UNITS CAPS Take 1,000 Units by mouth daily with breakfast.    Yes [provider]  cyanocobalamin 1000 MCG tablet Take 1,000 mcg by mouth daily.    Yes [provider]  dofetilide (TIKOSYN) 125 MCG capsule TAKE ONE CAPSULE BY MOUTH TWICE A DAY Patient taking differently: Take 125 mcg by mouth 2 (two) times daily. 09/11/20  Yes Evans Lance, MD  ELIQUIS 5 MG TABS tablet Take 1 tablet (5 mg total) by mouth 2 (two) times daily. 08/12/20  Yes Belva Crome, MD  ferrous sulfate 325 (65 FE) MG tablet Take 325 mg by mouth daily with breakfast.   Yes [provider]  furosemide (LASIX) 40 MG tablet TAKE 1 TABLET(40 MG) BY MOUTH DAILY Patient taking differently: Take 40 mg by mouth daily. 07/11/20  Yes Evans Lance, MD  gabapentin (NEURONTIN) 300 MG capsule Take 300 mg by mouth 2 (two) times daily. 12/02/20  Yes [provider]  LORazepam (ATIVAN) 1 MG tablet Take 1 mg by mouth  at bedtime.   Yes [provider]  losartan (COZAAR) 100 MG tablet Take 100 mg by mouth daily.    Yes [provider]  magnesium oxide (MAG-OX) 400 (241.3 Mg) MG tablet Take 1 tablet (400 mg total) by mouth daily. 01/16/20  Yes Evans Lance, MD  Melatonin 10 MG TABS Take 1 tablet by mouth as needed (sleep).   Yes [provider]  omeprazole (PRILOSEC) 20 MG capsule Take 20 mg by mouth daily. 09/29/20  Yes [provider]  OVER THE COUNTER MEDICATION Take 6 capsules by mouth daily. Balance of nature 3 capsules of fruit and 3 capsules of vegetables   Yes [provider]  potassium chloride (KLOR-CON) 10 MEQ tablet Take 4 tablets (40 mEq total) by mouth daily. 06/14/19  Yes Shirley Friar, PA-C  pyridOXINE (VITAMIN B-6) 100 MG tablet Take 100 mg by mouth daily.   Yes [provider]  rosuvastatin (CRESTOR) 40 MG tablet Take 1 tablet (40 mg total) by mouth daily at 6 PM. 11/26/19  Yes Belva Crome, MD  vitamin E 400 UNIT capsule Take 400 Units by mouth daily.   Yes [provider]    Allergies    Fenofibrate and Ondansetron  Review of Systems   Review of Systems  Constitutional:  Negative for fever.  Respiratory:  Positive for chest tightness. Negative for shortness of breath.   Cardiovascular:  Positive for chest pain. Negative for leg swelling.  Gastrointestinal:  Positive for nausea. Negative for abdominal pain.  Genitourinary:  Negative for dysuria.  All other systems reviewed and are negative.  Physical Exam Updated Vital Signs BP (!) 167/72   Pulse (!) 48   Temp 98.2 F (36.8 C) (Oral)   Resp 16   Ht 1.651 m (5\' 5" )   Wt 84.6 kg   SpO2 98%   BMI 31.04 kg/m   Physical Exam Vitals and nursing note reviewed.  Constitutional:      Appearance: She is well-developed. She is obese. She is not ill-appearing.  HENT:     Head: Normocephalic and atraumatic.  Eyes:     Pupils: Pupils are equal, round, and  reactive to light.  Cardiovascular:     Rate and Rhythm: Normal rate and regular rhythm.     Heart sounds: Normal heart sounds.  Pulmonary:     Effort: Pulmonary effort is normal. No respiratory distress.     Breath sounds: No wheezing.  Abdominal:     General: Bowel sounds are normal.     Palpations: Abdomen is soft.  Musculoskeletal:     Cervical back: Neck supple.     Right lower leg: No tenderness. No edema.     Left lower leg: No tenderness. No edema.  Skin:    General: Skin is warm and dry.  Neurological:     Mental Status: She is alert and oriented to person, place, and time.  Psychiatric:        Mood and Affect: Mood normal.    ED Results / Procedures / Treatments   Labs (all labs ordered are listed, but only abnormal results are displayed) Labs Reviewed  BASIC METABOLIC PANEL - Abnormal; Notable for the following components:      Result Value   Sodium 130 (*)    Chloride 96 (*)    Glucose, Bld 115 (*)    All other components within normal limits  CBC - Abnormal; Notable for the following components:   RBC 3.73 (*)    Hemoglobin 11.4 (*)    HCT 33.8 (*)    All other components within normal limits  CBG MONITORING, ED - Abnormal; Notable for the following components:   Glucose-Capillary 119 (*)    All other components within normal limits  TROPONIN I (HIGH SENSITIVITY) - Abnormal; Notable for the following components:   Troponin I (High Sensitivity) 37 (*)    All other components within normal limits  TROPONIN I (HIGH SENSITIVITY) - Abnormal; Notable for the following components:   Troponin I (High Sensitivity) 170 (*)    All other components within normal limits  PROTIME-INR    EKG None  Radiology DG Chest 2 View  Result Date:  12/07/2020 CLINICAL DATA:  Central chest pressure. EXAM: CHEST - 2 VIEW COMPARISON:  None. FINDINGS: The heart size and mediastinal contours are within normal limits. A radiopaque loop recorder device is noted. Both lungs are  clear. The visualized skeletal structures are unremarkable. IMPRESSION: No active cardiopulmonary disease. Electronically Signed   By: Virgina Norfolk M.D.   On: 12/07/2020 01:15    Procedures Procedures   Medications Ordered in ED Medications  nitroGLYCERIN (NITROGLYN) 2 % ointment 1 inch (has no administration in time range)  aspirin tablet 325 mg (has no administration in time range)    ED Course  I have reviewed the triage vital signs and the nursing notes.  Pertinent labs & imaging results that were available during my care of the patient were reviewed by me and considered in my medical decision making (see chart for details).    MDM Rules/Calculators/A&P                           Patient presents with chest pain, nausea, generally not feeling well.  EKG is unchanged from prior and shows a bundle branch block.  She is notably hypertensive with blood pressure initially 185/69.  Chest x-ray without pneumothorax or pneumonia.  This was independently reviewed by myself.  She is currently pain-free.  She is given a full dose aspirin.  Initial troponin elevated at 37.  Second troponin elevated at 170.  Given symptoms, would be highly suspicious for an NSTEMI.  She has known disease.  She is on Eliquis and is currently pain-free.  We will hold heparin drip at this time.  Nitro paste applied for her blood pressure.  Will consult cardiology.   Final Clinical Impression(s) / ED Diagnoses Final diagnoses:  NSTEMI (non-ST elevated myocardial infarction) [facility])    Rx / [place] Orders ED Discharge Orders     None        Merryl Hacker, MD 12/07/20 [phone]

## 2020-12-07 NOTE — Progress Notes (Signed)
ANTICOAGULATION CONSULT NOTE - Initial Consult  Pharmacy Consult for heparin Indication: chest pain/ACS but has Afib at baseline (PTA eliquis)  Allergies  Allergen Reactions   Fenofibrate Other (See Comments)    elevated LFTs   Ondansetron Other (See Comments)    drug interaction    Patient Measurements: Height: 5\' 5"  (165.1 cm) Weight: 84.6 kg (186 lb 8.2 oz) IBW/kg (Calculated) : 57 Heparin Dosing Weight: 75.3 kg  Vital Signs: Temp: 97.7 F (36.5 C) (10/02 2006) Temp Source: Oral (10/02 2006) BP: 148/64 (10/02 2006) Pulse Rate: 50 (10/02 2006)  Labs: Recent Labs    12/07/20 0101 12/07/20 0252 12/07/20 0916 12/07/20 1052 12/07/20 2022  HGB 11.4*  --   --   --   --   HCT 33.8*  --   --   --   --   PLT 224  --   --   --   --   APTT  --   --  34  --  107*  LABPROT 14.4  --   --   --   --   INR 1.1  --   --   --   --   HEPARINUNFRC  --   --  >1.10*  --   --   CREATININE 0.65  --   --   --   --   TROPONINIHS 37* 170* 308* 321*  --      Estimated Creatinine Clearance: 57.2 mL/min (by C-G formula based on SCr of 0.65 mg/dL).   Medical History: Past Medical History:  Diagnosis Date   Arthritis    Atrial fibrillation ([place])    Breast cancer ([place])    Cancer ([place])    Breast- rt   Cough with sputum 2016   Elevated liver function tests 05/09/2013   GERD (gastroesophageal reflux disease)    COSTOCHONDRITIS   Heart murmur    Hip pain 05/08/2014   Hyperlipidemia    Hypertension    Lipoma    back of neck   PONV (postoperative nausea and vomiting)    only after rt hip surgery   Primary osteoarthritis of left knee 11/04/2015   Primary osteoarthritis of right hip 07/30/2014   Squamous cell carcinoma of skin 08/21/2019   atypical squa. proliferation-Left corner of mouth      Assessment:  85 yo F on apixaban PTA for atrial fibrillation admitted with NSTEMI. Last dose of apixaban was 10/1 at 18:30. Will follow aPTT until HL correlates. Pharmacy consulted for heparin.     APTT 107 is slightly supratherapeutic.  No issues with infusion or bleeding per RN. Per RN, heparin was held for about 5 minutes before drawing aPTT from left arm where heparin is running due to right arm being restricted.   Goal of Therapy:  Heparin level 0.3-0.7 units/ml Heparin level 66-102 units/ml Monitor platelets by anticoagulation protocol: Yes   Plan: Decrease heparin to 1050 units/hr F/u aPTT until correlates with heparin level  Monitor daily ,aPTT, HL, CBC/plt Monitor for signs/symptoms of bleeding     Benetta Spar, PharmD, BCPS, BCCP Clinical Pharmacist  Please check AMION for all [place] phone numbers After 10:00 PM, call [place]

## 2020-12-07 NOTE — Progress Notes (Signed)
ANTICOAGULATION CONSULT NOTE - Initial Consult  Pharmacy Consult for heparin Indication: chest pain/ACS but has Afib at baseline (PTA eliquis)  Allergies  Allergen Reactions   Fenofibrate Other (See Comments)    elevated LFTs   Ondansetron Other (See Comments)    drug interaction    Patient Measurements: Height: 5\' 5"  (165.1 cm) Weight: 84.6 kg (186 lb 8.2 oz) IBW/kg (Calculated) : 57 Heparin Dosing Weight: 75.3 kg  Vital Signs: Temp: 98.2 F (36.8 C) (10/02 0419) Temp Source: Oral (10/02 0419) BP: 151/55 (10/02 0700) Pulse Rate: 46 (10/02 0700)  Labs: Recent Labs    12/07/20 0101 12/07/20 0252  HGB 11.4*  --   HCT 33.8*  --   PLT 224  --   LABPROT 14.4  --   INR 1.1  --   CREATININE 0.65  --   TROPONINIHS 37* 170*    Estimated Creatinine Clearance: 57.2 mL/min (by C-G formula based on SCr of 0.65 mg/dL).   Medical History: Past Medical History:  Diagnosis Date   Arthritis    Atrial fibrillation ([place])    Breast cancer ([place])    Cancer ([place])    Breast- rt   Cough with sputum 2016   Elevated liver function tests 05/09/2013   GERD (gastroesophageal reflux disease)    COSTOCHONDRITIS   Heart murmur    Hip pain 05/08/2014   Hyperlipidemia    Hypertension    Lipoma    back of neck   PONV (postoperative nausea and vomiting)    only after rt hip surgery   Primary osteoarthritis of left knee 11/04/2015   Primary osteoarthritis of right hip 07/30/2014   Squamous cell carcinoma of skin 08/21/2019   atypical squa. proliferation-Left corner of mouth    Medications: see MAR  Assessment: 85 yo F hx of HTN, HLD, DM II, RBBB, CAD, heart murmur, mild sinus node dysfunction with intermittent asymptomatic bradycardia, h/o syncope s/p ILR and h/o persistent afib on dofetilide and Eliquis (LD 10/1 PM), presenting with CP and nausea. Found to have NSTEMI, EKG with no acute changes, troponin 37 > 170.  Heparin consult for Afib/ACS. CBC wnl. No s/sx of bleeding.  Goal of  Therapy:  Heparin level 0.3-0.7 units/ml Heparin level 66-102 units/ml Monitor platelets by anticoagulation protocol: Yes   Plan: no heparin bolus, start heparin gtt at 1150 units (15u/kg/hr) F/u 6h aPTT (using in place of HL due to PTA DOAC use) Monitor daily aPTT, HL, and CBC  Joetta Manners, PharmD, [facility] Emergency Medicine Clinical Pharmacist ED RPh Phone: [place]: [phone]

## 2020-12-07 NOTE — ED Notes (Signed)
ECHO tech at bedside.

## 2020-12-07 NOTE — H&P (Addendum)
Cardiology Admission History and Physical:   Patient ID: Sheila Gardner MRN: [id number]; DOB: 23-Apr-1935   Admission date: 12/07/2020  PCP:  Lawerance Cruel, MD   [facility] Cardiologist:  Sinclair Grooms, MD  Electrophysiologist:  Cristopher Peru, MD       Chief Complaint:  chest pressure  Patient Profile:   Sheila Gardner is a 85 y.o. female with HTN, HLD, DM II, RBBB, CAD, heart murmur, mild sinus node dysfunction with intermittent asymptomatic bradycardia, h/o syncope s/p ILR and h/o persistent afib on dofetilide who is being seen 12/07/2020 for the evaluation of chest pain.  History of Present Illness:   [name] is a 85 year old female with past medical history of HTN, HLD, DM II, RBBB, CAD, heart murmur, mild sinus node dysfunction with intermittent asymptomatic bradycardia, h/o syncope s/p ILR and h/o persistent afib on dofetilide.  Echocardiogram obtained on 12/27/2018 showed EF 65 to 70%, trace MR, mild aortic valve sclerosis without stenosis.  Patient underwent DCCV on 02/19/2019.  Coronary CT obtained on 04/23/2019 showed at least moderate 50 to 69% plaque in the proximal to mid LAD, due to heavy calcification, this may be inconclusive.  Subsequent FFR came back showing significant proximal LAD stenosis with FFR less than 0.5, no significant stenosis in left main, left circumflex or RCA based on FFR study.  Patient ultimately underwent cardiac catheterization on 04/30/2019 that revealed proximal to mid LAD lesions around 50%, 80% and 70% stenosis.  All 3 lesions were treated with a single 2.75 x 38 mm Onyx DES postdilated to 3.25 mm in diameter.  Distal to the stented area, there was 50 to 60% mid LAD residual.  There was also 60% mid left circumflex residual as well.  EF and LVEDP were normal at the time of cath.  Postprocedure, patient was placed on triple therapy with plan to drop aspirin after 30 days.  She is on reduced dose of dofetilide due to QT prolongation.   She has a history of asymptomatic bradycardia with heart rate down to the 40s, given lack of symptom, continued observation was recommended by Dr. Lovena Le.  She has orthostatic dizziness when she stands up too quickly however no recent presyncope or sudden onset of dizziness without change in the body position.  Patient was last seen by Dr. [name] in February 2022 at which time she was doing well, she was noted to have a heart murmur which was likely progressive aortic disease, outpatient echocardiogram was ordered and scheduled for 12/29/2020.  Patient was in her usual state of health until Friday night 12/05/2020 when she started having nausea and central chest pressure.  She denies any worsening shortness of breath or dizziness.  Symptom worsened by Saturday evening.  She checked her blood pressure and noted her blood pressure was quite high at 215/90s.  This prompted her to call EMS and seek medical attention at [facility].  On arrival, blood pressure was 185/69 with heart rate ranges from mid 40s to low 50s.  Sodium mildly low at 130.  Creatinine normal, hemoglobin 11.4.  Serial troponin 37--> 170.  Cardiology service consulted for possible NSTEMI.  Nitropatch has been placed on the patient.  Last dose of Eliquis prior to ED arrival was around 6:30 PM last night.  At the time of interview, chest pressure has significantly improved.   Past Medical History:  Diagnosis Date   Arthritis    Atrial fibrillation ([place])    Breast cancer ([place])  Cancer [facility])    Breast- rt   Cough with sputum 2016   Elevated liver function tests 05/09/2013   GERD (gastroesophageal reflux disease)    COSTOCHONDRITIS   Heart murmur    Hip pain 05/08/2014   Hyperlipidemia    Hypertension    Lipoma    back of neck   PONV (postoperative nausea and vomiting)    only after rt hip surgery   Primary osteoarthritis of left knee 11/04/2015   Primary osteoarthritis of right hip 07/30/2014   Squamous cell carcinoma of skin  08/21/2019   atypical squa. proliferation-Left corner of mouth    Past Surgical History:  Procedure Laterality Date   ANKLE FRACTURE SURGERY Right 2013   RIGHT    BREAST LUMPECTOMY Right 2011   BREAST SURGERY  04/22/2009   Rt lumpectomy   CARDIOVERSION N/A 02/19/2019   Procedure: CARDIOVERSION;  Surgeon: Dorothy Spark, MD;  Location: [place];  Service: Cardiovascular;  Laterality: N/A;   CORONARY ATHERECTOMY N/A 04/30/2019   Procedure: CORONARY ATHERECTOMY;  Surgeon: Belva Crome, MD;  Location: [place] CV LAB;  Service: Cardiovascular;  Laterality: N/A;  LAD   CORONARY STENT INTERVENTION N/A 04/30/2019   Procedure: CORONARY STENT INTERVENTION;  Surgeon: Belva Crome, MD;  Location: [place] CV LAB;  Service: Cardiovascular;  Laterality: N/A;  DES TO PROX-MID LAD   EYE SURGERY  2001   macular hole   JOINT REPLACEMENT  2016   KNEE SURGERY     LEFT HEART CATH AND CORONARY ANGIOGRAPHY N/A 04/30/2019   Procedure: LEFT HEART CATH AND CORONARY ANGIOGRAPHY;  Surgeon: Belva Crome, MD;  Location: [place] CV LAB;  Service: Cardiovascular;  Laterality: N/A;   MIDDLE EAR SURGERY Left 08/18/2015   repair eardrum and reconstruction   OVARY SURGERY  40 years ago   wedge   TOTAL HIP ARTHROPLASTY Right 07/30/2014   Procedure: TOTAL HIP ARTHROPLASTY ANTERIOR APPROACH;  Surgeon: Melrose Nakayama, MD;  Location: [place];  Service: Orthopedics;  Laterality: Right;   TOTAL KNEE ARTHROPLASTY Left 11/04/2015   TOTAL KNEE ARTHROPLASTY Left 11/04/2015   Procedure: TOTAL KNEE ARTHROPLASTY;  Surgeon: Melrose Nakayama, MD;  Location: [place];  Service: Orthopedics;  Laterality: Left;   TYMPANOPLASTY  30 years ago     Medications Prior to Admission: Prior to Admission medications   Medication Sig Start Date End Date Taking? Authorizing Provider  acetaminophen (TYLENOL) 650 MG CR tablet Take 1,300 mg by mouth every 8 (eight) hours as needed for pain.   Yes [provider]  Ascorbic  Acid (VITAMIN C) 1000 MG tablet Take 1,000 mg by mouth daily.    Yes [provider]  BIOTIN PO Take 10,000 Units by mouth daily with breakfast.    Yes [provider]  Cholecalciferol (VITAMIN D-3) 1000 UNITS CAPS Take 1,000 Units by mouth daily with breakfast.    Yes [provider]  cyanocobalamin 1000 MCG tablet Take 1,000 mcg by mouth daily.    Yes [provider]  dofetilide (TIKOSYN) 125 MCG capsule TAKE ONE CAPSULE BY MOUTH TWICE A DAY Patient taking differently: Take 125 mcg by mouth 2 (two) times daily. 09/11/20  Yes Evans Lance, MD  ELIQUIS 5 MG TABS tablet Take 1 tablet (5 mg total) by mouth 2 (two) times daily. 08/12/20  Yes Belva Crome, MD  ferrous sulfate 325 (65 FE) MG tablet Take 325 mg by mouth daily with breakfast.   Yes [provider]  furosemide (  LASIX) 40 MG tablet TAKE 1 TABLET(40 MG) BY MOUTH DAILY Patient taking differently: Take 40 mg by mouth daily. 07/11/20  Yes Evans Lance, MD  gabapentin (NEURONTIN) 300 MG capsule Take 300 mg by mouth 2 (two) times daily. 12/02/20  Yes [provider]  LORazepam (ATIVAN) 1 MG tablet Take 1 mg by mouth at bedtime.   Yes [provider]  losartan (COZAAR) 100 MG tablet Take 100 mg by mouth daily.    Yes [provider]  magnesium oxide (MAG-OX) 400 (241.3 Mg) MG tablet Take 1 tablet (400 mg total) by mouth daily. 01/16/20  Yes Evans Lance, MD  Melatonin 10 MG TABS Take 1 tablet by mouth as needed (sleep).   Yes [provider]  omeprazole (PRILOSEC) 20 MG capsule Take 20 mg by mouth daily. 09/29/20  Yes [provider]  OVER THE COUNTER MEDICATION Take 6 capsules by mouth daily. Balance of nature 3 capsules of fruit and 3 capsules of vegetables   Yes [provider]  potassium chloride (KLOR-CON) 10 MEQ tablet Take 4 tablets (40 mEq total) by mouth daily. 06/14/19  Yes Shirley Friar, PA-C  pyridOXINE (VITAMIN B-6) 100 MG  tablet Take 100 mg by mouth daily.   Yes [provider]  rosuvastatin (CRESTOR) 40 MG tablet Take 1 tablet (40 mg total) by mouth daily at 6 PM. 11/26/19  Yes Belva Crome, MD  vitamin E 400 UNIT capsule Take 400 Units by mouth daily.   Yes [provider]     Allergies:    Allergies  Allergen Reactions   Fenofibrate Other (See Comments)    elevated LFTs   Ondansetron Other (See Comments)    drug interaction    Social History:   Social History   Socioeconomic History   Marital status: Widowed    Spouse name: Not on file   Number of children: Not on file   Years of education: Not on file   Highest education level: Not on file  Occupational History   Not on file  Tobacco Use   Smoking status: Never   Smokeless tobacco: Never  Vaping Use   Vaping Use: Never used  Substance and Sexual Activity   Alcohol use: Not Currently   Drug use: No   Sexual activity: Not Currently    Birth control/protection: None  Other Topics Concern   Not on file  Social History Narrative   Not on file   Social Determinants of Health   Financial Resource Strain: Not on file  Food Insecurity: Not on file  Transportation Needs: Not on file  Physical Activity: Not on file  Stress: Not on file  Social Connections: Not on file  Intimate Partner Violence: Not on file    Family History:   The patient's family history includes Cancer in her brother; Heart disease in her mother.    ROS:  Please see the history of present illness.  All other ROS reviewed and negative.     Physical Exam/Data:   Vitals:   12/07/20 0535 12/07/20 0600 12/07/20 0615 12/07/20 0700  BP: (!) 173/71 (!) 167/72 (!) 160/70 (!) 151/55  Pulse: (!) 48 (!) 48 (!) 49 (!) 46  Resp: 13 16 16 15   Temp:      TempSrc:      SpO2: 99% 98% 98% 93%  Weight:      Height:       No intake or output data in the 24 hours ending  12/07/20 0833 Last 3 Weights 12/07/2020 05/01/2020 01/15/2020  Weight (lbs) 186 lb 8.2  oz 186 lb 6.4 oz 182 lb  Weight (kg) 84.6 kg 84.55 kg 82.555 kg     Body mass index is 31.04 kg/m.  General:  Well nourished, well developed, in no acute distress HEENT: normal Neck: no JVD Vascular: No carotid bruits; Distal pulses 2+ bilaterally   Cardiac:  normal S1, S2; RRR; no murmur  Lungs:  clear to auscultation bilaterally, no wheezing, rhonchi or rales  Abd: soft, nontender, no hepatomegaly  Ext: no edema Musculoskeletal:  No deformities, BUE and BLE strength normal and equal Skin: warm and dry  Neuro:  CNs 2-12 intact, no focal abnormalities noted Psych:  Normal affect    EKG:  The ECG that was done and was personally reviewed and demonstrates sinus bradycardia, right bundle branch block with left anterior fascicular block, no obvious ST-T wave changes.  Relevant CV Studies:  Echo 12/27/2018  1. Left ventricular ejection fraction, by visual estimation, is 65 to  70%. The left ventricle has hyperdynamic function. Normal left ventricular  size. There is moderately increased left ventricular hypertrophy. Normal  wall motion.   2. Left ventricular diastolic Doppler parameters are indeterminate  pattern of LV diastolic filling.   3. Global right ventricle has normal systolic function.The right  ventricular size is normal. No increase in right ventricular wall  thickness.   4. Left atrial size was normal.   5. Right atrial size was normal.   6. Mild mitral annular calcification.   7. The mitral valve is normal in structure. Trace mitral valve  regurgitation. No evidence of mitral stenosis.   8. The tricuspid valve is normal in structure. Tricuspid valve  regurgitation is trivial.   9. The aortic valve is tricuspid Aortic valve regurgitation was not  visualized by color flow Doppler. Mild aortic valve sclerosis without  stenosis.  10. TR signal is inadequate for assessing pulmonary artery systolic  pressure.  11. The inferior vena cava is normal in size with greater  than 50%  respiratory variability, suggesting right atrial pressure of 3 mmHg.   Laboratory Data:  High Sensitivity Troponin:   Recent Labs  Lab 12/07/20 0101 12/07/20 0252  TROPONINIHS 37* 170*      Chemistry Recent Labs  Lab 12/07/20 0101  NA 130*  K 4.1  CL 96*  CO2 25  GLUCOSE 115*  BUN 11  CREATININE 0.65  CALCIUM 9.6  GFRNONAA >60  ANIONGAP 9    No results for input(s): PROT, ALBUMIN, AST, ALT, ALKPHOS, BILITOT in the last 168 hours. Lipids No results for input(s): CHOL, TRIG, HDL, LABVLDL, LDLCALC, CHOLHDL in the last 168 hours. Hematology Recent Labs  Lab 12/07/20 0101  WBC 7.3  RBC 3.73*  HGB 11.4*  HCT 33.8*  MCV 90.6  MCH 30.6  MCHC 33.7  RDW 12.6  PLT 224   Thyroid No results for input(s): TSH, FREET4 in the last 168 hours. BNPNo results for input(s): BNP, PROBNP in the last 168 hours.  DDimer No results for input(s): DDIMER in the last 168 hours.   Radiology/Studies:  DG Chest 2 View  Result Date: 12/07/2020 CLINICAL DATA:  Central chest pressure. EXAM: CHEST - 2 VIEW COMPARISON:  None. FINDINGS: The heart size and mediastinal contours are within normal limits. A radiopaque loop recorder device is noted. Both lungs are clear. The visualized skeletal structures are unremarkable. IMPRESSION: No active cardiopulmonary disease. Electronically Signed   By: Virgina Norfolk  M.D.   On: 12/07/2020 01:15     Assessment and Plan:   NSTEMI  -Patient started having substernal chest pressure and nausea on Friday.  Symptom has been intermittent however worsened last night.  -She mentions she tried to walk around and drink some cold water last night, this seems to have helped her symptom of chest pressure.  She is somewhat of a poor historian and digress during the interview quite often.  She also mentions her symptom feels better when she laid down.  Chest pressure is not affected by deep inspiration or palpation.  -Serial troponin 37--> 170.  EKG shows no  acute changes  -We will continue to trend troponin.  Unclear if the elevation of the troponin is due to ACS versus hypertensive urgency.  If troponins continue to trend up, low threshold to proceed with cardiac catheterization tomorrow.  Focus on blood pressure control today.  -Obtain echocardiogram.  -Last dose of Eliquis was 6:30 PM Saturday night, I switched her to IV heparin.  Hypertensive urgency: Patient self-reported blood pressure was 215/90s last night.  On arrival to the ED, systolic blood pressure was over 180s.  She has been compliant with medication at home including 100 mg losartan.  She is not a candidate for AV nodal blocking agents such as beta-blocker due to baseline bradycardia.  We will add amlodipine to help with blood pressure control.  Consider switch home losartan to olmesartan for more BP benefit.  Hyponatremia: Unclear cause, based on blood work, she was also hyponatremic a year ago.  She is on 40 mg daily of Lasix but no thiazide diuretic.  CAD: Stent to proximal to mid LAD in February 2021.  At the time he had 50 to 60% mid LAD and a 60% mid left circumflex residuals.  Hyperlipidemia: On Crestor 40 mg daily  Right bundle branch block: EKG shows right bundle branch block with left anterior fascicular block  Heart murmur: Scheduled for outpatient echocardiogram on 10/24, will order echo in the hospital and canceled outpatient echo  Mild sinus node dysfunction with intermittent asymptomatic bradycardia  History of syncope s/p implantable loop recorder: No recent syncope.  Patient does have orthostatic dizziness  Persistent A. fib on dofetilide: History of cardioversions in December 2020.  A. fib is well suppressed on low-dose dofetilide.   Risk Assessment/Risk Scores:    TIMI Risk Score for Unstable Angina or Non-ST Elevation MI:   The patient's TIMI risk score is 5, which indicates a 26% risk of all cause mortality, new or recurrent myocardial infarction or need  for urgent revascularization in the next 14 days.    CHA2DS2-VASc Score = 5   This indicates a 7.2% annual risk of stroke. The patient's score is based upon: CHF History: 0 HTN History: 1 Diabetes History: 0 Stroke History: 0 Vascular Disease History: 1 Age Score: 2 Gender Score: 1      Severity of Illness: The appropriate patient status for this patient is INPATIENT. Inpatient status is judged to be reasonable and necessary in order to provide the required intensity of service to ensure the patient's safety. The patient's presenting symptoms, physical exam findings, and initial radiographic and laboratory data in the context of their chronic comorbidities is felt to place them at high risk for further clinical deterioration. Furthermore, it is not anticipated that the patient will be medically stable for discharge from the hospital within 2 midnights of admission. The following factors support the patient status of inpatient.   " The  patient's presenting symptoms include chest pain. " The worrisome physical exam findings include benign. " The initial radiographic and laboratory data are worrisome because of elevated troponin. " The chronic co-morbidities include CAD, persistent atrial fibrillation.   * I certify that at the point of admission it is my clinical judgment that the patient will require inpatient hospital care spanning beyond 2 midnights from the point of admission due to high intensity of service, high risk for further deterioration and high frequency of surveillance required.*   For questions or updates, please contact [place] Please consult [email or web address] for contact info under     Hilbert Corrigan, [place]  12/07/2020 8:33 AM

## 2020-12-07 NOTE — ED Provider Notes (Signed)
Emergency Medicine Provider Triage Evaluation Note  Sheila Gardner , a 85 y.o. female  was evaluated in triage.  Pt complains of intermittent central chest pressure and tightness since yesterday. Noted BP to be elevated to 852 systolic at home. Reports improvement in pain with activity, but has noted some nausea and being a little "sweaty". Is on Eliquis for A fib. Has a loop recorder. No associated fevers, syncope, leg swelling.  Review of Systems  Positive: Chest pressure Negative: Syncope, leg swelling  Physical Exam  BP (!) 185/69 (BP Location: Left Arm)   Pulse (!) 45   Temp 97.9 F (36.6 C) (Oral)   Resp 18   SpO2 99%  Gen:   Awake, no distress   Resp:  Normal effort  MSK:   Moves extremities without difficulty  Other:  Bradycardia, regular rhythm.  Medical Decision Making  Medically screening exam initiated at 12:50 AM.  Appropriate orders placed.  Sheila Gardner was informed that the remainder of the evaluation will be completed by another provider, this initial triage assessment does not replace that evaluation, and the importance of remaining in the ED until their evaluation is complete.  Chest pressure   Antonietta Breach, PA-C 12/07/20 [id number]    Merryl Hacker, MD 12/08/20 0100

## 2020-12-07 NOTE — ED Notes (Signed)
Pt ambulatory to restroom, steady gait. Denies feeling lightheaded or dizzy. No shortness of breath or increased CP  Pt and family updated on plan of care, including NPO status until cardiology makes a plan for her stay

## 2020-12-07 NOTE — ED Triage Notes (Signed)
The pt arrived by [facility] from home the pt has had a headache and her bp has been high since yesterday.  Some mild chest pain loop recorder

## 2020-12-07 NOTE — ED Notes (Signed)
Meal tray ordered 

## 2020-12-07 NOTE — ED Notes (Signed)
Verbal report given to [employer], RN on 3E.

## 2020-12-08 ENCOUNTER — Encounter (HOSPITAL_COMMUNITY)
Admission: EM | Disposition: A | Discharge status: Home / Self Care | Payer: Self-pay | Source: Home / Self Care | Attending: Cardiology

## 2020-12-08 DIAGNOSIS — I10 Essential (primary) hypertension: Secondary | ICD-10-CM | POA: Diagnosis not present

## 2020-12-08 DIAGNOSIS — I480 Paroxysmal atrial fibrillation: Secondary | ICD-10-CM | POA: Diagnosis not present

## 2020-12-08 DIAGNOSIS — I25119 Atherosclerotic heart disease of native coronary artery with unspecified angina pectoris: Secondary | ICD-10-CM

## 2020-12-08 DIAGNOSIS — I214 Non-ST elevation (NSTEMI) myocardial infarction: Secondary | ICD-10-CM | POA: Diagnosis not present

## 2020-12-08 DIAGNOSIS — R778 Other specified abnormalities of plasma proteins: Secondary | ICD-10-CM

## 2020-12-08 HISTORY — PX: LEFT HEART CATH AND CORONARY ANGIOGRAPHY: CATH118249

## 2020-12-08 LAB — LIPID PANEL
Cholesterol: 124 mg/dL (ref 0–200)
HDL: 57 mg/dL (ref >40)
LDL Cholesterol: 44 mg/dL (ref 0–99)
Total CHOL/HDL Ratio: 2.2 RATIO
Triglycerides: 115 mg/dL (ref <150)
VLDL: 23 mg/dL (ref 0–40)

## 2020-12-08 LAB — BASIC METABOLIC PANEL
Anion gap: 9 (ref 5–15)
BUN: 9 mg/dL (ref 8–23)
CO2: 27 mmol/L (ref 22–32)
Calcium: 9.4 mg/dL (ref 8.9–10.3)
Chloride: 96 mmol/L — ABNORMAL LOW (ref 98–111)
Creatinine, Ser: 0.7 mg/dL (ref 0.44–1.00)
GFR, Estimated: >60 mL/min (ref >60)
Glucose, Bld: 115 mg/dL — ABNORMAL HIGH (ref 70–99)
Potassium: 3.4 mmol/L — ABNORMAL LOW (ref 3.5–5.1)
Sodium: 132 mmol/L — ABNORMAL LOW (ref 135–145)

## 2020-12-08 LAB — HEMOGLOBIN A1C
Hgb A1c MFr Bld: 6.2 % — ABNORMAL HIGH (ref 4.8–5.6)
Mean Plasma Glucose: 131.24 mg/dL

## 2020-12-08 LAB — HEPARIN LEVEL (UNFRACTIONATED): Heparin Unfractionated: 0.82 IU/mL — ABNORMAL HIGH (ref 0.30–0.70)

## 2020-12-08 LAB — CBC
HCT: 34.5 % — ABNORMAL LOW (ref 36.0–46.0)
Hemoglobin: 11.7 g/dL — ABNORMAL LOW (ref 12.0–15.0)
MCH: 30.8 pg (ref 26.0–34.0)
MCHC: 33.9 g/dL (ref 30.0–36.0)
MCV: 90.8 fL (ref 80.0–100.0)
Platelets: 227 10*3/uL (ref 150–400)
RBC: 3.8 MIL/uL — ABNORMAL LOW (ref 3.87–5.11)
RDW: 12.7 % (ref 11.5–15.5)
WBC: 8.3 10*3/uL (ref 4.0–10.5)
nRBC: 0 % (ref 0.0–0.2)

## 2020-12-08 LAB — APTT: aPTT: 90 seconds — ABNORMAL HIGH (ref 24–36)

## 2020-12-08 SURGERY — LEFT HEART CATH AND CORONARY ANGIOGRAPHY
Anesthesia: LOCAL

## 2020-12-08 MED ORDER — SODIUM CHLORIDE 0.9% FLUSH
3.0000 mL | Freq: Two times a day (BID) | INTRAVENOUS | Status: DC
  Administered 2020-12-09 – 2020-12-10 (×3): 3 mL via INTRAVENOUS

## 2020-12-08 MED ORDER — ASPIRIN 81 MG PO CHEW
81.0000 mg | CHEWABLE_TABLET | ORAL | Status: DC
Start: 2020-12-09 — End: 2020-12-08

## 2020-12-08 MED ORDER — APIXABAN 5 MG PO TABS: 5.0000 mg | ORAL_TABLET | Freq: Two times a day (BID) | ORAL | Status: DC

## 2020-12-08 MED ORDER — HEPARIN SODIUM (PORCINE) 1000 UNIT/ML IJ SOLN
INTRAMUSCULAR | Status: AC
  Filled 2020-12-08: qty 1

## 2020-12-08 MED ORDER — POTASSIUM CHLORIDE CRYS ER 20 MEQ PO TBCR
40.0000 meq | EXTENDED_RELEASE_TABLET | Freq: Once | ORAL | Status: AC
  Administered 2020-12-08: 40 meq via ORAL
  Filled 2020-12-08: qty 2

## 2020-12-08 MED ORDER — MIDAZOLAM HCL 2 MG/2ML IJ SOLN
INTRAMUSCULAR | Status: AC
  Filled 2020-12-08: qty 2

## 2020-12-08 MED ORDER — HEPARIN (PORCINE) IN NACL 1000-0.9 UT/500ML-% IV SOLN
INTRAVENOUS | Status: DC | PRN
  Administered 2020-12-08 (×2): 500 mL

## 2020-12-08 MED ORDER — LABETALOL HCL 5 MG/ML IV SOLN: 10.0000 mg | INTRAVENOUS | Status: AC | PRN

## 2020-12-08 MED ORDER — SODIUM CHLORIDE 0.9% FLUSH: 3.0000 mL | INTRAVENOUS | Status: DC | PRN

## 2020-12-08 MED ORDER — HYDRALAZINE HCL 20 MG/ML IJ SOLN: 10.0000 mg | INTRAMUSCULAR | Status: AC | PRN

## 2020-12-08 MED ORDER — LIDOCAINE HCL (PF) 1 % IJ SOLN
INTRAMUSCULAR | Status: AC
  Filled 2020-12-08: qty 30

## 2020-12-08 MED ORDER — HEPARIN SODIUM (PORCINE) 1000 UNIT/ML IJ SOLN
INTRAMUSCULAR | Status: DC | PRN
  Administered 2020-12-08: 4000 [IU] via INTRAVENOUS

## 2020-12-08 MED ORDER — SODIUM CHLORIDE 0.9 % WEIGHT BASED INFUSION: 3.0000 mL/kg/h | INTRAVENOUS | Status: AC

## 2020-12-08 MED ORDER — SODIUM CHLORIDE 0.9 % IV SOLN: INTRAVENOUS | Status: AC

## 2020-12-08 MED ORDER — ACETAMINOPHEN 325 MG PO TABS: 650.0000 mg | ORAL_TABLET | ORAL | Status: DC | PRN

## 2020-12-08 MED ORDER — FENTANYL CITRATE (PF) 100 MCG/2ML IJ SOLN
INTRAMUSCULAR | Status: DC | PRN
  Administered 2020-12-08: 25 ug via INTRAVENOUS

## 2020-12-08 MED ORDER — LIDOCAINE HCL (PF) 1 % IJ SOLN
INTRAMUSCULAR | Status: DC | PRN
  Administered 2020-12-08: 2 mL via INTRADERMAL

## 2020-12-08 MED ORDER — IOHEXOL 350 MG/ML SOLN
INTRAVENOUS | Status: DC | PRN
  Administered 2020-12-08: 55 mL via INTRA_ARTERIAL

## 2020-12-08 MED ORDER — SODIUM CHLORIDE 0.9% FLUSH
3.0000 mL | Freq: Two times a day (BID) | INTRAVENOUS | Status: DC
  Administered 2020-12-08: 3 mL via INTRAVENOUS

## 2020-12-08 MED ORDER — GABAPENTIN 300 MG PO CAPS
300.0000 mg | ORAL_CAPSULE | Freq: Two times a day (BID) | ORAL | Status: DC
  Administered 2020-12-08 – 2020-12-10 (×4): 300 mg via ORAL
  Filled 2020-12-08 (×4): qty 1

## 2020-12-08 MED ORDER — VERAPAMIL HCL 2.5 MG/ML IV SOLN
INTRAVENOUS | Status: AC
  Filled 2020-12-08: qty 2

## 2020-12-08 MED ORDER — VERAPAMIL HCL 2.5 MG/ML IV SOLN
INTRAVENOUS | Status: DC | PRN
  Administered 2020-12-08: 10 mL via INTRA_ARTERIAL

## 2020-12-08 MED ORDER — SODIUM CHLORIDE 0.9 % WEIGHT BASED INFUSION
1.0000 mL/kg/h | INTRAVENOUS | Status: DC
  Administered 2020-12-08: 1 mL/kg/h via INTRAVENOUS

## 2020-12-08 MED ORDER — ONDANSETRON HCL 4 MG/2ML IJ SOLN: 4.0000 mg | Freq: Four times a day (QID) | INTRAMUSCULAR | Status: DC | PRN

## 2020-12-08 MED ORDER — SODIUM CHLORIDE 0.9 % IV SOLN: 250.0000 mL | INTRAVENOUS | Status: DC | PRN

## 2020-12-08 MED ORDER — HEPARIN (PORCINE) IN NACL 1000-0.9 UT/500ML-% IV SOLN
INTRAVENOUS | Status: AC
  Filled 2020-12-08: qty 1000

## 2020-12-08 MED ORDER — FENTANYL CITRATE (PF) 100 MCG/2ML IJ SOLN
INTRAMUSCULAR | Status: AC
  Filled 2020-12-08: qty 2

## 2020-12-08 MED ORDER — APIXABAN 5 MG PO TABS
5.0000 mg | ORAL_TABLET | Freq: Two times a day (BID) | ORAL | Status: DC
Start: 2020-12-09 — End: 2020-12-10
  Administered 2020-12-09 – 2020-12-10 (×3): 5 mg via ORAL
  Filled 2020-12-08 (×3): qty 1

## 2020-12-08 MED ORDER — MIDAZOLAM HCL 2 MG/2ML IJ SOLN
INTRAMUSCULAR | Status: DC | PRN
  Administered 2020-12-08: 1 mg via INTRAVENOUS

## 2020-12-08 SURGICAL SUPPLY — 12 items
CATH INFINITI 5 FR JL3.5 (CATHETERS) ×2 IMPLANT
CATH INFINITI JR4 5F (CATHETERS) ×2 IMPLANT
CATH OPTITORQUE TIG 4.0 5F (CATHETERS) ×2 IMPLANT
DEVICE RAD COMP TR BAND LRG (VASCULAR PRODUCTS) ×2 IMPLANT
GLIDESHEATH SLEND SS 6F .021 (SHEATH) ×2 IMPLANT
GUIDEWIRE INQWIRE 1.5J.035X260 (WIRE) ×1 IMPLANT
INQWIRE 1.5J .035X260CM (WIRE) ×2
KIT HEART LEFT (KITS) ×2 IMPLANT
PACK CARDIAC CATHETERIZATION (CUSTOM PROCEDURE TRAY) ×2 IMPLANT
SYR MEDRAD MARK 7 150ML (SYRINGE) ×2 IMPLANT
TRANSDUCER W/STOPCOCK (MISCELLANEOUS) ×2 IMPLANT
TUBING CIL FLEX 10 FLL-RA (TUBING) ×2 IMPLANT

## 2020-12-08 NOTE — H&P (View-Only) (Signed)
Progress Note  Patient Name: Sheila Gardner Date of Encounter: 12/08/2020  [facility] Cardiologist: Sinclair Grooms, MD   Subjective   Feeling well. No chest pain, sob or palpitations.    Inpatient Medications    Scheduled Meds:  amLODipine  5 mg Oral Daily   aspirin  81 mg Oral Daily   [START ON 12/09/2020] aspirin  81 mg Oral Pre-Cath   dofetilide  125 mcg Oral BID   ferrous sulfate  325 mg Oral Q breakfast   irbesartan  300 mg Oral Daily   magnesium oxide  400 mg Oral Daily   pantoprazole  40 mg Oral Daily   rosuvastatin  40 mg Oral q1800   sodium chloride flush  3 mL Intravenous Q12H   Continuous Infusions:  sodium chloride     sodium chloride 1 mL/kg/hr (12/08/20 0953)   heparin 1,050 Units/hr (12/08/20 0903)   PRN Meds: sodium chloride, acetaminophen, LORazepam, nitroGLYCERIN, sodium chloride flush   Vital Signs    Vitals:   12/07/20 2347 12/08/20 0441 12/08/20 0442 12/08/20 0721  BP: (!) 153/56 (!) 124/49  (!) 145/51  Pulse: (!) 47 (!) 50  (!) 44  Resp: 19 18  18   Temp: 97.8 F (36.6 C) 97.8 F (36.6 C)  98 F (36.7 C)  TempSrc: Oral Oral  Oral  SpO2: 100% 97%  97%  Weight:   82 kg   Height:        Intake/Output Summary (Last 24 hours) at 12/08/2020 1006 Last data filed at 12/08/2020 0916 Gross per 24 hour  Intake 200.1 ml  Output 1500 ml  Net -1299.9 ml   Last 3 Weights 12/08/2020 12/07/2020 12/07/2020  Weight (lbs) 180 lb 12.8 oz 183 lb 13.8 oz 186 lb 8.2 oz  Weight (kg) 82.01 kg 83.4 kg 84.6 kg      Telemetry    Sinus bradycardia  - Personally Reviewed  ECG    N/A  Physical Exam   GEN: No acute distress.   Neck: No JVD Cardiac: RRR, no murmurs, rubs, or gallops.  Respiratory: Clear to auscultation bilaterally. GI: Soft, nontender, non-distended  MS: No edema; No deformity. Neuro:  Nonfocal  Psych: Normal affect   Labs    High Sensitivity Troponin:   Recent Labs  Lab 12/07/20 0101 12/07/20 0252 12/07/20 0916  12/07/20 1052  TROPONINIHS 37* 170* 308* 321*     Chemistry Recent Labs  Lab 12/07/20 0101 12/07/20 1700 12/08/20 0307  NA 130*  --  132*  K 4.1  --  3.4*  CL 96*  --  96*  CO2 25  --  27  GLUCOSE 115*  --  115*  BUN 11  --  9  CREATININE 0.65  --  0.70  CALCIUM 9.6  --  9.4  MG  --  1.9  --   GFRNONAA >60  --  >60  ANIONGAP 9  --  9    Lipids  Recent Labs  Lab 12/08/20 0307  CHOL 124  TRIG 115  HDL 57  LDLCALC 44  CHOLHDL 2.2    Hematology Recent Labs  Lab 12/07/20 0101 12/08/20 0307  WBC 7.3 8.3  RBC 3.73* 3.80*  HGB 11.4* 11.7*  HCT 33.8* 34.5*  MCV 90.6 90.8  MCH 30.6 30.8  MCHC 33.7 33.9  RDW 12.6 12.7  PLT 224 227     Radiology    DG Chest 2 View  Result Date: 12/07/2020 CLINICAL DATA:  Central chest pressure.  EXAM: CHEST - 2 VIEW COMPARISON:  None. FINDINGS: The heart size and mediastinal contours are within normal limits. A radiopaque loop recorder device is noted. Both lungs are clear. The visualized skeletal structures are unremarkable. IMPRESSION: No active cardiopulmonary disease. Electronically Signed   By: Virgina Norfolk M.D.   On: 12/07/2020 01:15   ECHOCARDIOGRAM COMPLETE  Result Date: 12/07/2020    ECHOCARDIOGRAM REPORT   Patient Name:   [name] Date of Exam: 12/07/2020 Medical Rec #:  [id number]     Height:       65.0 in Accession #:    [id number]    Weight:       186.5 lb Date of Birth:  1935-06-01     BSA:          1.920 m Patient Age:    23 years      BP:           161/55 mmHg Patient Gender: F             HR:           49 bpm. Exam Location:  Inpatient Procedure: 2D Echo, Cardiac Doppler and Color Doppler Indications:    Chest Pain R07.9  History:        Patient has prior history of Echocardiogram examinations, most                 recent 12/27/2018. Arrythmias:Atrial Fibrillation,                 Signs/Symptoms:Murmur; Risk Factors:Hypertension and                 Dyslipidemia. GERD.  Sonographer:    Darlina Sicilian RDCS Referring  Phys: [phone] HAO MENG IMPRESSIONS  1. Left ventricular ejection fraction, by estimation, is 65 to 70%. Left ventricular ejection fraction by 3D volume is 67 %. The left ventricle has normal function. The left ventricle has no regional wall motion abnormalities. There is severe left ventricular hypertrophy of the basal-septal segment. Left ventricular diastolic parameters are indeterminate. Elevated left ventricular end-diastolic pressure. The average left ventricular global longitudinal strain is -22.2 %. The global longitudinal strain is normal.  2. Right ventricular systolic function is normal. The right ventricular size is normal. Tricuspid regurgitation signal is inadequate for assessing PA pressure.  3. The mitral valve is normal in structure. Trivial mitral valve regurgitation. No evidence of mitral stenosis.  4. The aortic valve is tricuspid. Aortic valve regurgitation is trivial. Mild aortic valve sclerosis is present, with no evidence of aortic valve stenosis.  5. The inferior vena cava is normal in size with greater than 50% respiratory variability, suggesting right atrial pressure of 3 mmHg. FINDINGS  Left Ventricle: Left ventricular ejection fraction, by estimation, is 65 to 70%. Left ventricular ejection fraction by 3D volume is 67 %. The left ventricle has normal function. The left ventricle has no regional wall motion abnormalities. The average left ventricular global longitudinal strain is -22.2 %. The global longitudinal strain is normal. The left ventricular internal cavity size was normal in size. There is severe left ventricular hypertrophy of the basal-septal segment. Left ventricular diastolic parameters are indeterminate. Elevated left ventricular end-diastolic pressure. Right Ventricle: The right ventricular size is normal. No increase in right ventricular wall thickness. Right ventricular systolic function is normal. Tricuspid regurgitation signal is inadequate for assessing PA pressure.  Left Atrium: Left atrial size was normal in size. Right Atrium: Right atrial size was normal in size. Pericardium: There  is no evidence of pericardial effusion. Mitral Valve: The mitral valve is normal in structure. Mild to moderate mitral annular calcification. Trivial mitral valve regurgitation. No evidence of mitral valve stenosis. Tricuspid Valve: The tricuspid valve is normal in structure. Tricuspid valve regurgitation is not demonstrated. No evidence of tricuspid stenosis. Aortic Valve: The aortic valve is tricuspid. Aortic valve regurgitation is trivial. Mild aortic valve sclerosis is present, with no evidence of aortic valve stenosis. Pulmonic Valve: The pulmonic valve was normal in structure. Pulmonic valve regurgitation is trivial. No evidence of pulmonic stenosis. Aorta: The aortic root is normal in size and structure. Venous: The inferior vena cava is normal in size with greater than 50% respiratory variability, suggesting right atrial pressure of 3 mmHg. IAS/Shunts: No atrial level shunt detected by color flow Doppler.  LEFT VENTRICLE PLAX 2D LVIDd:         4.80 cm         Diastology LVIDs:         3.40 cm         LV e' medial:    5.03 cm/s LV PW:         1.00 cm         LV E/e' medial:  22.3 LV IVS:        0.90 cm         LV e' lateral:   8.25 cm/s LVOT diam:     1.90 cm         LV E/e' lateral: 13.6 LV SV:         85 LV SV Index:   44              2D LVOT Area:     2.84 cm        Longitudinal                                Strain                                2D Strain GLS  -22.5 %                                (A2C):                                2D Strain GLS  -22.0 %                                (A3C):                                2D Strain GLS  -21.9 %                                (A4C):                                2D Strain GLS  -22.2 %  Avg:                                 3D Volume EF                                LV 3D EF:    Left                                              ventricul                                             ar                                             ejection                                             fraction                                             by 3D                                             volume is                                             67 %.                                 3D Volume EF:                                3D EF:        67 %                                LV EDV:       116 ml                                LV ESV:       38 ml                                LV SV:        78 ml RIGHT VENTRICLE RV S prime:  12.60 cm/s TAPSE (M-mode): 2.4 cm LEFT ATRIUM             Index       RIGHT ATRIUM           Index LA diam:        4.10 cm 2.14 cm/m  RA Area:     12.10 cm LA Vol (A2C):   36.7 ml 19.11 ml/m RA Volume:   22.80 ml  11.87 ml/m LA Vol (A4C):   44.9 ml 23.38 ml/m LA Biplane Vol: 42.9 ml 22.34 ml/m  AORTIC VALVE LVOT Vmax:   124.00 cm/s LVOT Vmean:  78.600 cm/s LVOT VTI:    0.300 m  AORTA Ao Root diam: 3.40 cm Ao Asc diam:  3.50 cm MITRAL VALVE MV Area (PHT): 2.77 cm     SHUNTS MV Decel Time: 274 msec     Systemic VTI:  0.30 m MV E velocity: 112.00 cm/s  Systemic Diam: 1.90 cm MV A velocity: 119.00 cm/s MV E/A ratio:  0.94 Fransico Him MD Electronically signed by Fransico Him MD Signature Date/Time: 12/07/2020/12:45:45 PM    Final     Cardiac Studies   Echo 12/07/2020 1. Left ventricular ejection fraction, by estimation, is 65 to 70%. Left  ventricular ejection fraction by 3D volume is 67 %. The left ventricle has  normal function. The left ventricle has no regional wall motion  abnormalities. There is severe left  ventricular hypertrophy of the basal-septal segment. Left ventricular  diastolic parameters are indeterminate. Elevated left ventricular  end-diastolic pressure. The average left ventricular global longitudinal  strain is -22.2 %. The global longitudinal  strain is normal.   2.  Right ventricular systolic function is normal. The right ventricular  size is normal. Tricuspid regurgitation signal is inadequate for assessing  PA pressure.   3. The mitral valve is normal in structure. Trivial mitral valve  regurgitation. No evidence of mitral stenosis.   4. The aortic valve is tricuspid. Aortic valve regurgitation is trivial.  Mild aortic valve sclerosis is present, with no evidence of aortic valve  stenosis.   5. The inferior vena cava is normal in size with greater than 50%  respiratory variability, suggesting right atrial pressure of 3 mmHg.   Patient Profile     85 y.o. female  with HTN, HLD, DM II, RBBB, CAD, heart murmur, mild sinus node dysfunction with intermittent asymptomatic bradycardia, h/o syncope s/p ILR and h/o persistent afib on dofetilide who is being seen for the evaluation of chest pain in setting of hypertensive urgency.   Assessment & Plan    NSTEMI -  37>>170>>308>>321. Unclear if the elevation of the troponin is due to ACS versus hypertensive urgency. Currently CP free. For cath later today.  - Continue ASA, Statin and heparin -No beta-blocker due to bradycardia  2. PAF with bradycardia -Patient remains in sinus bradycardia on dofetilide -Eliquis on hold for cardiac catheterization -On heparin for anticoagulation  3.  Hypertensive urgency - Patient self-reported blood pressure was 215/90s last night.  On arrival to the ED, systolic blood pressure was over 180s.   -Losartan changed to home losartan -Added amlodipine -Blood pressure now stable  4.  Hyperlipidemia -Continue Crestor 40 mg daily -12/08/2020: Cholesterol 124; HDL 57; LDL Cholesterol 44; Triglycerides 115; VLDL 23   For questions or updates, please contact [place] Please consult [email or web address] for contact info under      SignedLeanor Kail, PA  12/08/2020, 10:06 AM

## 2020-12-08 NOTE — Progress Notes (Signed)
Progress Note  Patient Name: Sheila Gardner Date of Encounter: 12/08/2020  [facility] Cardiologist: Sinclair Grooms, MD   Subjective   Feeling well. No chest pain, sob or palpitations.    Inpatient Medications    Scheduled Meds:  amLODipine  5 mg Oral Daily   aspirin  81 mg Oral Daily   [START ON 12/09/2020] aspirin  81 mg Oral Pre-Cath   dofetilide  125 mcg Oral BID   ferrous sulfate  325 mg Oral Q breakfast   irbesartan  300 mg Oral Daily   magnesium oxide  400 mg Oral Daily   pantoprazole  40 mg Oral Daily   rosuvastatin  40 mg Oral q1800   sodium chloride flush  3 mL Intravenous Q12H   Continuous Infusions:  sodium chloride     sodium chloride 1 mL/kg/hr (12/08/20 0953)   heparin 1,050 Units/hr (12/08/20 0903)   PRN Meds: sodium chloride, acetaminophen, LORazepam, nitroGLYCERIN, sodium chloride flush   Vital Signs    Vitals:   12/07/20 2347 12/08/20 0441 12/08/20 0442 12/08/20 0721  BP: (!) 153/56 (!) 124/49  (!) 145/51  Pulse: (!) 47 (!) 50  (!) 44  Resp: 19 18  18   Temp: 97.8 F (36.6 C) 97.8 F (36.6 C)  98 F (36.7 C)  TempSrc: Oral Oral  Oral  SpO2: 100% 97%  97%  Weight:   82 kg   Height:        Intake/Output Summary (Last 24 hours) at 12/08/2020 1006 Last data filed at 12/08/2020 0916 Gross per 24 hour  Intake 200.1 ml  Output 1500 ml  Net -1299.9 ml   Last 3 Weights 12/08/2020 12/07/2020 12/07/2020  Weight (lbs) 180 lb 12.8 oz 183 lb 13.8 oz 186 lb 8.2 oz  Weight (kg) 82.01 kg 83.4 kg 84.6 kg      Telemetry    Sinus bradycardia  - Personally Reviewed  ECG    N/A  Physical Exam   GEN: No acute distress.   Neck: No JVD Cardiac: RRR, no murmurs, rubs, or gallops.  Respiratory: Clear to auscultation bilaterally. GI: Soft, nontender, non-distended  MS: No edema; No deformity. Neuro:  Nonfocal  Psych: Normal affect   Labs    High Sensitivity Troponin:   Recent Labs  Lab 12/07/20 0101 12/07/20 0252 12/07/20 0916  12/07/20 1052  TROPONINIHS 37* 170* 308* 321*     Chemistry Recent Labs  Lab 12/07/20 0101 12/07/20 1700 12/08/20 0307  NA 130*  --  132*  K 4.1  --  3.4*  CL 96*  --  96*  CO2 25  --  27  GLUCOSE 115*  --  115*  BUN 11  --  9  CREATININE 0.65  --  0.70  CALCIUM 9.6  --  9.4  MG  --  1.9  --   GFRNONAA >60  --  >60  ANIONGAP 9  --  9    Lipids  Recent Labs  Lab 12/08/20 0307  CHOL 124  TRIG 115  HDL 57  LDLCALC 44  CHOLHDL 2.2    Hematology Recent Labs  Lab 12/07/20 0101 12/08/20 0307  WBC 7.3 8.3  RBC 3.73* 3.80*  HGB 11.4* 11.7*  HCT 33.8* 34.5*  MCV 90.6 90.8  MCH 30.6 30.8  MCHC 33.7 33.9  RDW 12.6 12.7  PLT 224 227     Radiology    DG Chest 2 View  Result Date: 12/07/2020 CLINICAL DATA:  Central chest pressure.  EXAM: CHEST - 2 VIEW COMPARISON:  None. FINDINGS: The heart size and mediastinal contours are within normal limits. A radiopaque loop recorder device is noted. Both lungs are clear. The visualized skeletal structures are unremarkable. IMPRESSION: No active cardiopulmonary disease. Electronically Signed   By: Virgina Norfolk M.D.   On: 12/07/2020 01:15   ECHOCARDIOGRAM COMPLETE  Result Date: 12/07/2020    ECHOCARDIOGRAM REPORT   Patient Name:   Sheila Gardner Date of Exam: 12/07/2020 Medical Rec #:  [id number]     Height:       65.0 in Accession #:    [id number]    Weight:       186.5 lb Date of Birth:  06-Jan-1936     BSA:          1.920 m Patient Age:    85 years      BP:           161/55 mmHg Patient Gender: F             HR:           49 bpm. Exam Location:  Inpatient Procedure: 2D Echo, Cardiac Doppler and Color Doppler Indications:    Chest Pain R07.9  History:        Patient has prior history of Echocardiogram examinations, most                 recent 12/27/2018. Arrythmias:Atrial Fibrillation,                 Signs/Symptoms:Murmur; Risk Factors:Hypertension and                 Dyslipidemia. GERD.  Sonographer:    Darlina Sicilian RDCS Referring  Phys: [phone] HAO MENG IMPRESSIONS  1. Left ventricular ejection fraction, by estimation, is 65 to 70%. Left ventricular ejection fraction by 3D volume is 67 %. The left ventricle has normal function. The left ventricle has no regional wall motion abnormalities. There is severe left ventricular hypertrophy of the basal-septal segment. Left ventricular diastolic parameters are indeterminate. Elevated left ventricular end-diastolic pressure. The average left ventricular global longitudinal strain is -22.2 %. The global longitudinal strain is normal.  2. Right ventricular systolic function is normal. The right ventricular size is normal. Tricuspid regurgitation signal is inadequate for assessing PA pressure.  3. The mitral valve is normal in structure. Trivial mitral valve regurgitation. No evidence of mitral stenosis.  4. The aortic valve is tricuspid. Aortic valve regurgitation is trivial. Mild aortic valve sclerosis is present, with no evidence of aortic valve stenosis.  5. The inferior vena cava is normal in size with greater than 50% respiratory variability, suggesting right atrial pressure of 3 mmHg. FINDINGS  Left Ventricle: Left ventricular ejection fraction, by estimation, is 65 to 70%. Left ventricular ejection fraction by 3D volume is 67 %. The left ventricle has normal function. The left ventricle has no regional wall motion abnormalities. The average left ventricular global longitudinal strain is -22.2 %. The global longitudinal strain is normal. The left ventricular internal cavity size was normal in size. There is severe left ventricular hypertrophy of the basal-septal segment. Left ventricular diastolic parameters are indeterminate. Elevated left ventricular end-diastolic pressure. Right Ventricle: The right ventricular size is normal. No increase in right ventricular wall thickness. Right ventricular systolic function is normal. Tricuspid regurgitation signal is inadequate for assessing PA pressure.  Left Atrium: Left atrial size was normal in size. Right Atrium: Right atrial size was normal in size. Pericardium: There  is no evidence of pericardial effusion. Mitral Valve: The mitral valve is normal in structure. Mild to moderate mitral annular calcification. Trivial mitral valve regurgitation. No evidence of mitral valve stenosis. Tricuspid Valve: The tricuspid valve is normal in structure. Tricuspid valve regurgitation is not demonstrated. No evidence of tricuspid stenosis. Aortic Valve: The aortic valve is tricuspid. Aortic valve regurgitation is trivial. Mild aortic valve sclerosis is present, with no evidence of aortic valve stenosis. Pulmonic Valve: The pulmonic valve was normal in structure. Pulmonic valve regurgitation is trivial. No evidence of pulmonic stenosis. Aorta: The aortic root is normal in size and structure. Venous: The inferior vena cava is normal in size with greater than 50% respiratory variability, suggesting right atrial pressure of 3 mmHg. IAS/Shunts: No atrial level shunt detected by color flow Doppler.  LEFT VENTRICLE PLAX 2D LVIDd:         4.80 cm         Diastology LVIDs:         3.40 cm         LV e' medial:    5.03 cm/s LV PW:         1.00 cm         LV E/e' medial:  22.3 LV IVS:        0.90 cm         LV e' lateral:   8.25 cm/s LVOT diam:     1.90 cm         LV E/e' lateral: 13.6 LV SV:         85 LV SV Index:   44              2D LVOT Area:     2.84 cm        Longitudinal                                Strain                                2D Strain GLS  -22.5 %                                (A2C):                                2D Strain GLS  -22.0 %                                (A3C):                                2D Strain GLS  -21.9 %                                (A4C):                                2D Strain GLS  -22.2 %  Avg:                                 3D Volume EF                                LV 3D EF:    Left                                              ventricul                                             ar                                             ejection                                             fraction                                             by 3D                                             volume is                                             67 %.                                 3D Volume EF:                                3D EF:        67 %                                LV EDV:       116 ml                                LV ESV:       38 ml                                LV SV:        78 ml RIGHT VENTRICLE RV S prime:  12.60 cm/s TAPSE (M-mode): 2.4 cm LEFT ATRIUM             Index       RIGHT ATRIUM           Index LA diam:        4.10 cm 2.14 cm/m  RA Area:     12.10 cm LA Vol (A2C):   36.7 ml 19.11 ml/m RA Volume:   22.80 ml  11.87 ml/m LA Vol (A4C):   44.9 ml 23.38 ml/m LA Biplane Vol: 42.9 ml 22.34 ml/m  AORTIC VALVE LVOT Vmax:   124.00 cm/s LVOT Vmean:  78.600 cm/s LVOT VTI:    0.300 m  AORTA Ao Root diam: 3.40 cm Ao Asc diam:  3.50 cm MITRAL VALVE MV Area (PHT): 2.77 cm     SHUNTS MV Decel Time: 274 msec     Systemic VTI:  0.30 m MV E velocity: 112.00 cm/s  Systemic Diam: 1.90 cm MV A velocity: 119.00 cm/s MV E/A ratio:  0.94 Fransico Him MD Electronically signed by Fransico Him MD Signature Date/Time: 12/07/2020/12:45:45 PM    Final     Cardiac Studies   Echo 12/07/2020 1. Left ventricular ejection fraction, by estimation, is 65 to 70%. Left  ventricular ejection fraction by 3D volume is 67 %. The left ventricle has  normal function. The left ventricle has no regional wall motion  abnormalities. There is severe left  ventricular hypertrophy of the basal-septal segment. Left ventricular  diastolic parameters are indeterminate. Elevated left ventricular  end-diastolic pressure. The average left ventricular global longitudinal  strain is -22.2 %. The global longitudinal  strain is normal.   2.  Right ventricular systolic function is normal. The right ventricular  size is normal. Tricuspid regurgitation signal is inadequate for assessing  PA pressure.   3. The mitral valve is normal in structure. Trivial mitral valve  regurgitation. No evidence of mitral stenosis.   4. The aortic valve is tricuspid. Aortic valve regurgitation is trivial.  Mild aortic valve sclerosis is present, with no evidence of aortic valve  stenosis.   5. The inferior vena cava is normal in size with greater than 50%  respiratory variability, suggesting right atrial pressure of 3 mmHg.   Patient Profile     85 y.o. female  with HTN, HLD, DM II, RBBB, CAD, heart murmur, mild sinus node dysfunction with intermittent asymptomatic bradycardia, h/o syncope s/p ILR and h/o persistent afib on dofetilide who is being seen for the evaluation of chest pain in setting of hypertensive urgency.   Assessment & Plan    NSTEMI -  37>>170>>308>>321. Unclear if the elevation of the troponin is due to ACS versus hypertensive urgency. Currently CP free. For cath later today.  - Continue ASA, Statin and heparin -No beta-blocker due to bradycardia  2. PAF with bradycardia -Patient remains in sinus bradycardia on dofetilide -Eliquis on hold for cardiac catheterization -On heparin for anticoagulation  3.  Hypertensive urgency - Patient self-reported blood pressure was 215/90s last night.  On arrival to the ED, systolic blood pressure was over 180s.   -Losartan changed to home losartan -Added amlodipine -Blood pressure now stable  4.  Hyperlipidemia -Continue Crestor 40 mg daily -12/08/2020: Cholesterol 124; HDL 57; LDL Cholesterol 44; Triglycerides 115; VLDL 23   For questions or updates, please contact [place] Please consult [email or web address] for contact info under      SignedLeanor Kail, PA  12/08/2020, 10:06 AM

## 2020-12-08 NOTE — Progress Notes (Signed)
Do not need to resume heparin drip per card PA.

## 2020-12-08 NOTE — Interval H&P Note (Signed)
History and Physical Interval Note:  12/08/2020 3:08 PM  Sheila Gardner  has presented today for surgery, with the diagnosis of NSTEMI.  The various methods of treatment have been discussed with the patient and family. After consideration of risks, benefits and other options for treatment, the patient has consented to  Procedure(s): LEFT HEART CATH AND CORONARY ANGIOGRAPHY (N/A) as a surgical intervention.  The patient's history has been reviewed, patient examined, no change in status, stable for surgery.  I have reviewed the patient's chart and labs.  Questions were answered to the patient's satisfaction.     Shelva Majestic  Cath Lab Visit (complete for each Cath Lab visit)  Clinical Evaluation Leading to the Procedure:   ACS: Yes.    Non-ACS:    Anginal Classification: CCS II  Anti-ischemic medical therapy: Minimal Therapy (1 class of medications)  Non-Invasive Test Results: No non-invasive testing performed  Prior CABG: No previous CABG

## 2020-12-08 NOTE — Interval H&P Note (Signed)
History and Physical Interval Note:  12/08/2020 3:08 PM  Sheila Gardner  has presented today for surgery, with the diagnosis of NSTEMI.  The various methods of treatment have been discussed with the patient and family. After consideration of risks, benefits and other options for treatment, the patient has consented to  Procedure(s): LEFT HEART CATH AND CORONARY ANGIOGRAPHY (N/A) as a surgical intervention.  The patient's history has been reviewed, patient examined, no change in status, stable for surgery.  I have reviewed the patient's chart and labs.  Questions were answered to the patient's satisfaction.     Shelva Majestic

## 2020-12-08 NOTE — Progress Notes (Signed)
ANTICOAGULATION CONSULT NOTE - Follow Up Consult  Pharmacy Consult for Heparin Indication: chest pain/ACS, PTA Eliquis for Afib on hold  Allergies  Allergen Reactions   Fenofibrate Other (See Comments)    elevated LFTs   Ondansetron Other (See Comments)    drug interaction    Patient Measurements: Height: 5\' 4"  (162.6 cm) Weight: 82 kg (180 lb 12.8 oz) IBW/kg (Calculated) : 54.7 Heparin Dosing Weight: 75 kg  Vital Signs: Temp: 98 F (36.7 C) (10/03 0721) Temp Source: Oral (10/03 0721) BP: 145/51 (10/03 0721) Pulse Rate: 44 (10/03 0721)  Labs: Recent Labs    12/07/20 0101 12/07/20 0252 12/07/20 0916 12/07/20 1052 12/07/20 2022 12/08/20 0307  HGB 11.4*  --   --   --   --  11.7*  HCT 33.8*  --   --   --   --  34.5*  PLT 224  --   --   --   --  227  APTT  --   --  34  --  107* 90*  LABPROT 14.4  --   --   --   --   --   INR 1.1  --   --   --   --   --   HEPARINUNFRC  --   --  >1.10*  --   --  0.82*  CREATININE 0.65  --   --   --   --  0.70  TROPONINIHS 37* 170* 308* 321*  --   --     Estimated Creatinine Clearance: 55.2 mL/min (by C-G formula based on SCr of 0.7 mg/dL).  Assessment: 85 yo F on apixaban PTA for atrial fibrillation admitted with NSTEMI. Last dose of apixaban was 10/1 at 18:30. Will follow aPTT until HL correlates. Pharmacy consulted for heparin.      aPTT therapeutic (90 seconds) on 1050 units/hr. Heparin level supratherapeutic (0.82) but skewed by recent Eliquis doses.   Plan cardiac cath today.  Goal of Therapy:  Heparin level 0.3-0.7 units/ml aPTT 66-102 seconds Monitor platelets by anticoagulation protocol: Yes   Plan:  Continue heparin drip at 1050 units/hr. Monitor using aPTTs until correlating with heparin levels. Daily aPTT, heparin level and CBC while on heparin. Follow up post-cath. Eliquis on hold.  Arty Baumgartner, RPh 12/08/2020,8:46 AM

## 2020-12-08 NOTE — Interval H&P Note (Signed)
Cath Lab Visit (complete for each Cath Lab visit)  Clinical Evaluation Leading to the Procedure:   ACS: Yes.    Non-ACS:    Anginal Classification: CCS II  Anti-ischemic medical therapy: Minimal Therapy (1 class of medications)  Non-Invasive Test Results: No non-invasive testing performed  Prior CABG: No previous CABG      History and Physical Interval Note:  12/08/2020 2:58 PM  Sheila Gardner  has presented today for surgery, with the diagnosis of NSTEMI.  The various methods of treatment have been discussed with the patient and family. After consideration of risks, benefits and other options for treatment, the patient has consented to  Procedure(s): LEFT HEART CATH AND CORONARY ANGIOGRAPHY (N/A) as a surgical intervention.  The patient's history has been reviewed, patient examined, no change in status, stable for surgery.  I have reviewed the patient's chart and labs.  Questions were answered to the patient's satisfaction.     Shelva Majestic

## 2020-12-08 NOTE — Progress Notes (Signed)
The pharmacist stated that if pt's qtc is greater than 0.475 MD should change zofran to other nausea medication. Higher qtc indicates Zofran might interact with Tikosyn so they may need to change to other medication. Pt's latest qtc was 0.428

## 2020-12-09 ENCOUNTER — Encounter (HOSPITAL_COMMUNITY): Payer: Self-pay | Admitting: Cardiovascular Disease

## 2020-12-09 DIAGNOSIS — R778 Other specified abnormalities of plasma proteins: Secondary | ICD-10-CM | POA: Diagnosis not present

## 2020-12-09 DIAGNOSIS — I248 Other forms of acute ischemic heart disease: Secondary | ICD-10-CM

## 2020-12-09 DIAGNOSIS — I10 Essential (primary) hypertension: Secondary | ICD-10-CM | POA: Diagnosis not present

## 2020-12-09 DIAGNOSIS — I214 Non-ST elevation (NSTEMI) myocardial infarction: Secondary | ICD-10-CM | POA: Diagnosis not present

## 2020-12-09 LAB — BASIC METABOLIC PANEL
Anion gap: 8 (ref 5–15)
BUN: 11 mg/dL (ref 8–23)
CO2: 24 mmol/L (ref 22–32)
Calcium: 8.9 mg/dL (ref 8.9–10.3)
Chloride: 101 mmol/L (ref 98–111)
Creatinine, Ser: 0.63 mg/dL (ref 0.44–1.00)
GFR, Estimated: >60 mL/min (ref >60)
Glucose, Bld: 95 mg/dL (ref 70–99)
Potassium: 3.9 mmol/L (ref 3.5–5.1)
Sodium: 133 mmol/L — ABNORMAL LOW (ref 135–145)

## 2020-12-09 LAB — CBC
HCT: 32.8 % — ABNORMAL LOW (ref 36.0–46.0)
Hemoglobin: 11.2 g/dL — ABNORMAL LOW (ref 12.0–15.0)
MCH: 31.1 pg (ref 26.0–34.0)
MCHC: 34.1 g/dL (ref 30.0–36.0)
MCV: 91.1 fL (ref 80.0–100.0)
Platelets: 230 10*3/uL (ref 150–400)
RBC: 3.6 MIL/uL — ABNORMAL LOW (ref 3.87–5.11)
RDW: 12.8 % (ref 11.5–15.5)
WBC: 7.6 10*3/uL (ref 4.0–10.5)
nRBC: 0 % (ref 0.0–0.2)

## 2020-12-09 LAB — MAGNESIUM: Magnesium: 2.2 mg/dL (ref 1.7–2.4)

## 2020-12-09 MED ORDER — AMLODIPINE BESYLATE 10 MG PO TABS: 10.0000 mg | ORAL_TABLET | Freq: Every day | ORAL | 1 refills | Status: DC

## 2020-12-09 MED ORDER — POTASSIUM CHLORIDE CRYS ER 20 MEQ PO TBCR
20.0000 meq | EXTENDED_RELEASE_TABLET | Freq: Once | ORAL | Status: AC
  Administered 2020-12-09: 20 meq via ORAL
  Filled 2020-12-09: qty 1

## 2020-12-09 MED ORDER — MECLIZINE HCL 25 MG PO TABS
25.0000 mg | ORAL_TABLET | Freq: Once | ORAL | Status: AC
  Administered 2020-12-09: 25 mg via ORAL
  Filled 2020-12-09: qty 1

## 2020-12-09 MED ORDER — AMLODIPINE BESYLATE 10 MG PO TABS
10.0000 mg | ORAL_TABLET | Freq: Every day | ORAL | Status: DC
  Administered 2020-12-10: 10 mg via ORAL
  Filled 2020-12-09: qty 1

## 2020-12-09 NOTE — Progress Notes (Signed)
   Called by RN shortly after DC ordered, patient complained of headache/nausea. No chest pain. Just "didn't feel well". Review of telemetry, no high degree AVB, HR noted in the 40-50 range at the time of episode. About an hr later rates in the upper 30s -low 40 range. Maybe vagal related with nausea. Amlodipine increased to 10mg  daily this morning for better BP control. Re-evaluated and still with on-going nausea, some headache. Will cancel DC for today. Follow on telemetry. She is on tikosyn 144mcg BID (low dose in the setting of prolonged QT). EKG in am.   Signed, Reino Bellis, NP-C 12/09/2020, 2:37 PM Pager: [phone]

## 2020-12-09 NOTE — Plan of Care (Signed)
  Problem: Coping: Goal: Level of anxiety will decrease Outcome: Progressing   Problem: Pain Managment: Goal: General experience of comfort will improve Outcome: Progressing   Problem: Safety: Goal: Ability to remain free from injury will improve Outcome: Progressing   

## 2020-12-09 NOTE — Discharge Summary (Signed)
Discharge Summary    Patient ID: Sheila Gardner MRN: [id number]; DOB: [birth year]  Admit date: 12/07/2020 Discharge date: 12/09/2020  PCP:  Sheila Cruel, MD   [place] [facility] Cardiologist:  Sheila Grooms, MD  Electrophysiologist:  Sheila Peru, MD  {   Discharge Diagnoses    Principal Problem:   NSTEMI (non-ST elevated myocardial infarction) [facility]) Active Problems:   Hyperlipidemia LDL goal <70   Hypertension   Persistent atrial fibrillation ([place])   Elevated troponin    Diagnostic Studies/Procedures    Cath: 12/08/20  Dist Cx lesion is 50% stenosed.   Mid LAD-1 lesion is 40% stenosed with 50% stenosed side branch in 2nd Diag.   Dist LAD lesion is 30% stenosed.   Mid LAD-2 lesion is 30% stenosed.   Previously placed Dist LM to Mid LAD stent (unknown type) is  widely patent.   The left ventricular systolic function is normal.   LV end diastolic pressure is normal.   The left ventricular ejection fraction is 55-65% by visual estimate.   Widely patent ostial to proximal LAD stent at site of prior atherectomy/DES stenting with 40 to 50% LAD stenoses beyond the stented segment and 30% mid and mid distal stenosis; large codominant left circumflex vessel with 50% stenosis after the OM 2 vessel in the AV groove circumflex and normal RCA.   Normal LV function without focal segmental wall motion abnormalities with EF estimated 55 to 60%.  LVEDP 14 mmHg   RECOMMENDATION: Eliquis can be resumed in a.m.  Medical therapy for concomitant CAD.  Rest of lipid-lowering therapy with target LDL less than 70.   Diagnostic Dominance: Co-dominant   Echo: 12/07/20  IMPRESSIONS     1. Left ventricular ejection fraction, by estimation, is 65 to 70%. Left  ventricular ejection fraction by 3D volume is 67 %. The left ventricle has  normal function. The left ventricle has no regional wall motion  abnormalities. There is severe left  ventricular hypertrophy of the  basal-septal segment. Left ventricular  diastolic parameters are indeterminate. Elevated left ventricular  end-diastolic pressure. The average left ventricular global longitudinal  strain is -22.2 %. The global longitudinal  strain is normal.   2. Right ventricular systolic function is normal. The right ventricular  size is normal. Tricuspid regurgitation signal is inadequate for assessing  PA pressure.   3. The mitral valve is normal in structure. Trivial mitral valve  regurgitation. No evidence of mitral stenosis.   4. The aortic valve is tricuspid. Aortic valve regurgitation is trivial.  Mild aortic valve sclerosis is present, with no evidence of aortic valve  stenosis.   5. The inferior vena cava is normal in size with greater than 50%  respiratory variability, suggesting right atrial pressure of 3 mmHg.   FINDINGS   Left Ventricle: Left ventricular ejection fraction, by estimation, is 65  to 70%. Left ventricular ejection fraction by 3D volume is 67 %. The left  ventricle has normal function. The left ventricle has no regional wall  motion abnormalities. The average  left ventricular global longitudinal strain is -22.2 %. The global  longitudinal strain is normal. The left ventricular internal cavity size  was normal in size. There is severe left ventricular hypertrophy of the  basal-septal segment. Left ventricular  diastolic parameters are indeterminate. Elevated left ventricular  end-diastolic pressure.   Right Ventricle: The right ventricular size is normal. No increase in  right ventricular wall thickness. Right ventricular systolic function is  normal. Tricuspid  regurgitation signal is inadequate for assessing PA  pressure.   Left Atrium: Left atrial size was normal in size.   Right Atrium: Right atrial size was normal in size.   Pericardium: There is no evidence of pericardial effusion.   Mitral Valve: The mitral valve is normal in structure. Mild to moderate   mitral annular calcification. Trivial mitral valve regurgitation. No  evidence of mitral valve stenosis.   Tricuspid Valve: The tricuspid valve is normal in structure. Tricuspid  valve regurgitation is not demonstrated. No evidence of tricuspid  stenosis.   Aortic Valve: The aortic valve is tricuspid. Aortic valve regurgitation is  trivial. Mild aortic valve sclerosis is present, with no evidence of  aortic valve stenosis.   Pulmonic Valve: The pulmonic valve was normal in structure. Pulmonic valve  regurgitation is trivial. No evidence of pulmonic stenosis.   Aorta: The aortic root is normal in size and structure.   Venous: The inferior vena cava is normal in size with greater than 50%  respiratory variability, suggesting right atrial pressure of 3 mmHg.   IAS/Shunts: No atrial level shunt detected by color flow Doppler.  _____________   History of Present Illness     Sheila Gardner is a 85 y.o. female with HTN, HLD, DM II, RBBB, CAD, heart murmur, mild sinus node dysfunction with intermittent asymptomatic bradycardia, h/o syncope s/p ILR and h/o persistent afib on dofetilide who presented on 10/2.  Sheila Gardner is a 85 year old female with past medical history of HTN, HLD, DM II, RBBB, CAD, heart murmur, mild sinus node dysfunction with intermittent asymptomatic bradycardia, h/o syncope s/p ILR and h/o persistent afib on dofetilide.  Echocardiogram obtained on 12/27/2018 showed EF 65 to 70%, trace MR, mild aortic valve sclerosis without stenosis.  Patient underwent DCCV on 02/19/2019.  Coronary CT obtained on 04/23/2019 showed at least moderate 50 to 69% plaque in the proximal to mid LAD, due to heavy calcification, this may be inconclusive.  Subsequent FFR came back showing significant proximal LAD stenosis with FFR less than 0.5, no significant stenosis in left main, left circumflex or RCA based on FFR study.  Patient ultimately underwent cardiac catheterization on 04/30/2019 that  revealed proximal to mid LAD lesions around 50%, 80% and 70% stenosis.  All 3 lesions were treated with a single 2.75 x 38 mm Onyx DES postdilated to 3.25 mm in diameter.  Distal to the stented area, there was 50 to 60% mid LAD residual.  There was also 60% mid left circumflex residual as well.  EF and LVEDP were normal at the time of cath.  Postprocedure, patient was placed on triple therapy with plan to drop aspirin after 30 days.  She was on reduced dose of dofetilide due to QT prolongation.  She has a history of asymptomatic bradycardia with heart rate down to the 40s, given lack of symptom, continued observation was recommended by Dr. Lovena Le.  She has orthostatic dizziness when she stands up too quickly however no recent presyncope or sudden onset of dizziness without change in the body position.  Patient was last seen by Dr. [name] in February 2022 at which time she was doing well, she was noted to have a heart murmur which was likely progressive aortic disease, outpatient echocardiogram was ordered and scheduled for 12/29/2020.   Patient was in her usual state of health until Friday night 12/05/2020 when she started having nausea and central chest pressure.  She denies any worsening shortness of breath or dizziness.  Symptom worsened  by Saturday evening.  She checked her blood pressure and noted her blood pressure was quite high at 215/90s.  This prompted her to call EMS and seek medical attention at [facility].  On arrival, blood pressure was 185/69 with heart rate ranges from mid 40s to low 50s.  Sodium mildly low at 130.  Creatinine normal, hemoglobin 11.4.  Serial troponin 37--> 170.  Cardiology service consulted for possible NSTEMI.  Nitropatch has been placed on the patient.  Last dose of Eliquis prior to ED arrival was around 6:30 PM last night.      Hospital Course     NSTEMI/Demand ischemia: hsTn peaked at 308. Underwent cardiac cath noted above with widely patent pLAD stent, 50%  stenosis beyond stented segment. No culprit lesion noted for troponin elevation. Continue medical therapy. Suspect her significantly elevated BP contributed to leak of cardiac markers. No recurrent chest pain. Follow up echo showed EF of 65-70% with no rWMA.    Hypertensive urgency: Patient self-reported blood pressure was 215/90s the night prior to admission.  On arrival to the ED, systolic blood pressure was over 180s.  She has been compliant with medication at home including 100 mg losartan.   -- added amlodipine 10mg  daily with improvement. Avoid AVN blocking agents with bradycardia. Continued on home ARB.   Hyponatremia: Unclear cause, based on blood work, she was also hyponatremic a year ago.  She is on 40 mg daily of Lasix but no thiazide diuretic. Na+ 133 on discharge    CAD: Stent to proximal to mid LAD in February 2021.  At the time he had 50 to 60% mid LAD and a 60% mid left circumflex residuals.   Hyperlipidemia: On Crestor 40 mg daily   Heart murmur: Echo this admission without evidence of significant valvular disease.   Mild sinus node dysfunction: intermittent asymptomatic bradycardia   History of syncope s/p implantable loop recorder: No recent syncope.     Persistent A. fib on dofetilide: History of cardioversions in December 2020.  A. fib is well suppressed on low-dose dofetilide.  General: Well developed, well nourished, female appearing in no acute distress. Head: Normocephalic, atraumatic.  Neck: Supple without bruits, JVD. Lungs:  Resp regular and unlabored, CTA. Heart: RRR, S1, S2, no S3, S4, soft systolic murmur; no rub. Abdomen: Soft, non-tender, non-distended with normoactive bowel sounds. No hepatomegaly. No rebound/guarding. No obvious abdominal masses. Extremities: No clubbing, cyanosis, edema. Distal pedal pulses are 2+ bilaterally. Right cath site stable without bruising or hematoma Neuro: Alert and oriented X 3. Moves all extremities spontaneously. Psych:  Normal affect.  Patient was seen by Dr. Martinique and deemed stable for discharge home. Follow up in the office has been arranged.  Did the patient have an acute coronary syndrome (MI, NSTEMI, STEMI, etc) this admission?:  No.   The elevated Troponin was due to the acute medical illness (demand ischemia).      _____________  Discharge Vitals Blood pressure (!) 129/50, pulse (!) 43, temperature 97.7 F (36.5 C), temperature source Oral, resp. rate 18, height 5\' 4"  (1.626 m), weight 82 kg, SpO2 98 %.  Filed Weights   12/07/20 2300 12/08/20 0442 12/09/20 0454  Weight: 83.4 kg 82 kg 82 kg    Labs & Radiologic Studies    CBC Recent Labs    12/08/20 0307 12/09/20 0258  WBC 8.3 7.6  HGB 11.7* 11.2*  HCT 34.5* 32.8*  MCV 90.8 91.1  PLT 227 735   Basic Metabolic Panel Recent Labs  12/07/20 1700 12/08/20 0307 12/09/20 0258  NA  --  132* 133*  K  --  3.4* 3.9  CL  --  96* 101  CO2  --  27 24  GLUCOSE  --  115* 95  BUN  --  9 11  CREATININE  --  0.70 0.63  CALCIUM  --  9.4 8.9  MG 1.9  --  2.2   Liver Function Tests No results for input(s): AST, ALT, ALKPHOS, BILITOT, PROT, ALBUMIN in the last 72 hours. No results for input(s): LIPASE, AMYLASE in the last 72 hours. High Sensitivity Troponin:   Recent Labs  Lab 12/07/20 0101 12/07/20 0252 12/07/20 0916 12/07/20 1052  TROPONINIHS 37* 170* 308* 321*    BNP Invalid input(s): POCBNP D-Dimer No results for input(s): DDIMER in the last 72 hours. Hemoglobin A1C Recent Labs    12/08/20 0307  HGBA1C 6.2*   Fasting Lipid Panel Recent Labs    12/08/20 0307  CHOL 124  HDL 57  LDLCALC 44  TRIG 115  CHOLHDL 2.2   Thyroid Function Tests No results for input(s): TSH, T4TOTAL, T3FREE, THYROIDAB in the last 72 hours.  Invalid input(s): FREET3 _____________  DG Chest 2 View  Result Date: 12/07/2020 CLINICAL DATA:  Central chest pressure. EXAM: CHEST - 2 VIEW COMPARISON:  None. FINDINGS: The heart size and  mediastinal contours are within normal limits. A radiopaque loop recorder device is noted. Both lungs are clear. The visualized skeletal structures are unremarkable. IMPRESSION: No active cardiopulmonary disease. Electronically Signed   By: Virgina Norfolk M.D.   On: 12/07/2020 01:15   CARDIAC CATHETERIZATION  Result Date: 12/08/2020   Dist Cx lesion is 50% stenosed.   Mid LAD-1 lesion is 40% stenosed with 50% stenosed side branch in 2nd Diag.   Dist LAD lesion is 30% stenosed.   Mid LAD-2 lesion is 30% stenosed.   Previously placed Dist LM to Mid LAD stent (unknown type) is  widely patent.   The left ventricular systolic function is normal.   LV end diastolic pressure is normal.   The left ventricular ejection fraction is 55-65% by visual estimate. Widely patent ostial to proximal LAD stent at site of prior atherectomy/DES stenting with 40 to 50% LAD stenoses beyond the stented segment and 30% mid and mid distal stenosis; large codominant left circumflex vessel with 50% stenosis after the OM 2 vessel in the AV groove circumflex and normal RCA. Normal LV function without focal segmental wall motion abnormalities with EF estimated 55 to 60%.  LVEDP 14 mmHg RECOMMENDATION: Eliquis can be resumed in a.m.  Medical therapy for concomitant CAD.  Rest of lipid-lowering therapy with target LDL less than 70.   ECHOCARDIOGRAM COMPLETE  Result Date: 12/07/2020    ECHOCARDIOGRAM REPORT   Patient Name:   Sheila Gardner Date of Exam: 12/07/2020 Medical Rec #:  [id number]     Height:       65.0 in Accession #:    [id number]    Weight:       186.5 lb Date of Birth:  [birth year]     BSA:          1.920 m Patient Age:    68 years      BP:           161/55 mmHg Patient Gender: F             HR:           49 bpm. Exam Location:  Inpatient Procedure: 2D Echo, Cardiac Doppler and Color Doppler Indications:    Chest Pain R07.9  History:        Patient has prior history of Echocardiogram examinations, most                 recent  12/27/2018. Arrythmias:Atrial Fibrillation,                 Signs/Symptoms:Murmur; Risk Factors:Hypertension and                 Dyslipidemia. GERD.  Sonographer:    Darlina Sicilian RDCS Referring Phys: [phone] HAO MENG IMPRESSIONS  1. Left ventricular ejection fraction, by estimation, is 65 to 70%. Left ventricular ejection fraction by 3D volume is 67 %. The left ventricle has normal function. The left ventricle has no regional wall motion abnormalities. There is severe left ventricular hypertrophy of the basal-septal segment. Left ventricular diastolic parameters are indeterminate. Elevated left ventricular end-diastolic pressure. The average left ventricular global longitudinal strain is -22.2 %. The global longitudinal strain is normal.  2. Right ventricular systolic function is normal. The right ventricular size is normal. Tricuspid regurgitation signal is inadequate for assessing PA pressure.  3. The mitral valve is normal in structure. Trivial mitral valve regurgitation. No evidence of mitral stenosis.  4. The aortic valve is tricuspid. Aortic valve regurgitation is trivial. Mild aortic valve sclerosis is present, with no evidence of aortic valve stenosis.  5. The inferior vena cava is normal in size with greater than 50% respiratory variability, suggesting right atrial pressure of 3 mmHg. FINDINGS  Left Ventricle: Left ventricular ejection fraction, by estimation, is 65 to 70%. Left ventricular ejection fraction by 3D volume is 67 %. The left ventricle has normal function. The left ventricle has no regional wall motion abnormalities. The average left ventricular global longitudinal strain is -22.2 %. The global longitudinal strain is normal. The left ventricular internal cavity size was normal in size. There is severe left ventricular hypertrophy of the basal-septal segment. Left ventricular diastolic parameters are indeterminate. Elevated left ventricular end-diastolic pressure. Right Ventricle: The right  ventricular size is normal. No increase in right ventricular wall thickness. Right ventricular systolic function is normal. Tricuspid regurgitation signal is inadequate for assessing PA pressure. Left Atrium: Left atrial size was normal in size. Right Atrium: Right atrial size was normal in size. Pericardium: There is no evidence of pericardial effusion. Mitral Valve: The mitral valve is normal in structure. Mild to moderate mitral annular calcification. Trivial mitral valve regurgitation. No evidence of mitral valve stenosis. Tricuspid Valve: The tricuspid valve is normal in structure. Tricuspid valve regurgitation is not demonstrated. No evidence of tricuspid stenosis. Aortic Valve: The aortic valve is tricuspid. Aortic valve regurgitation is trivial. Mild aortic valve sclerosis is present, with no evidence of aortic valve stenosis. Pulmonic Valve: The pulmonic valve was normal in structure. Pulmonic valve regurgitation is trivial. No evidence of pulmonic stenosis. Aorta: The aortic root is normal in size and structure. Venous: The inferior vena cava is normal in size with greater than 50% respiratory variability, suggesting right atrial pressure of 3 mmHg. IAS/Shunts: No atrial level shunt detected by color flow Doppler.  LEFT VENTRICLE PLAX 2D LVIDd:         4.80 cm         Diastology LVIDs:         3.40 cm         LV e' medial:    5.03 cm/s LV PW:  1.00 cm         LV E/e' medial:  22.3 LV IVS:        0.90 cm         LV e' lateral:   8.25 cm/s LVOT diam:     1.90 cm         LV E/e' lateral: 13.6 LV SV:         85 LV SV Index:   44              2D LVOT Area:     2.84 cm        Longitudinal                                Strain                                2D Strain GLS  -22.5 %                                (A2C):                                2D Strain GLS  -22.0 %                                (A3C):                                2D Strain GLS  -21.9 %                                (A4C):                                 2D Strain GLS  -22.2 %                                Avg:                                 3D Volume EF                                LV 3D EF:    Left                                             ventricul                                             ar  ejection                                             fraction                                             by 3D                                             volume is                                             67 %.                                 3D Volume EF:                                3D EF:        67 %                                LV EDV:       116 ml                                LV ESV:       38 ml                                LV SV:        78 ml RIGHT VENTRICLE RV S prime:     12.60 cm/s TAPSE (M-mode): 2.4 cm LEFT ATRIUM             Index       RIGHT ATRIUM           Index LA diam:        4.10 cm 2.14 cm/m  RA Area:     12.10 cm LA Vol (A2C):   36.7 ml 19.11 ml/m RA Volume:   22.80 ml  11.87 ml/m LA Vol (A4C):   44.9 ml 23.38 ml/m LA Biplane Vol: 42.9 ml 22.34 ml/m  AORTIC VALVE LVOT Vmax:   124.00 cm/s LVOT Vmean:  78.600 cm/s LVOT VTI:    0.300 m  AORTA Ao Root diam: 3.40 cm Ao Asc diam:  3.50 cm MITRAL VALVE MV Area (PHT): 2.77 cm     SHUNTS MV Decel Time: 274 msec     Systemic VTI:  0.30 m MV E velocity: 112.00 cm/s  Systemic Diam: 1.90 cm MV A velocity: 119.00 cm/s MV E/A ratio:  0.94 Fransico Him MD Electronically signed by Fransico Him MD Signature Date/Time: 12/07/2020/12:45:45 PM    Final    Disposition   Pt is being discharged home today in  good condition.  Follow-up Plans & Appointments     Follow-up Information     Imogene Burn, PA-C Follow up on 12/31/2020.   Specialty: Cardiology Why: at 10:45am for your follow up appt with Dr. Thompson Caul PA Contact information: [address] [place] [address] [phone]                 Discharge Instructions     Call MD for:  difficulty breathing, headache or visual disturbances   Complete by: As directed    Call MD for:  persistant dizziness or light-headedness   Complete by: As directed    Call MD for:  redness, tenderness, or signs of infection (pain, swelling, redness, odor or green/yellow discharge around incision site)   Complete by: As directed    Diet - low sodium heart healthy   Complete by: As directed    Discharge instructions   Complete by: As directed    Radial Site Care Refer to this sheet in the next few weeks. These instructions provide you with information on caring for yourself after your procedure. Your caregiver may also give you more specific instructions. Your treatment has been planned according to current medical practices, but problems sometimes occur. Call your caregiver if you have any problems or questions after your procedure. HOME CARE INSTRUCTIONS You may shower the day after the procedure. Remove the bandage (dressing) and gently wash the site with plain soap and water. Gently pat the site dry.  Do not apply powder or lotion to the site.  Do not submerge the affected site in water for 3 to 5 days.  Inspect the site at least twice daily.  Do not flex or bend the affected arm for 24 hours.  No lifting over 5 pounds (2.3 kg) for 5 days after your procedure.  Do not drive home if you are discharged the same day of the procedure. Have someone else drive you.  You may drive 24 hours after the procedure unless otherwise instructed by your caregiver.  What to expect: Any bruising will usually fade within 1 to 2 weeks.  Blood that collects in the tissue (hematoma) may be painful to the touch. It should usually decrease in size and tenderness within 1 to 2 weeks.  SEEK IMMEDIATE MEDICAL CARE IF: You have unusual pain at the radial site.  You have redness, warmth, swelling, or pain at the radial site.  You have drainage (other than a small  amount of blood on the dressing).  You have chills.  You have a fever or persistent symptoms for more than 72 hours.  You have a fever and your symptoms suddenly get worse.  Your arm becomes pale, cool, tingly, or numb.  You have heavy bleeding from the site. Hold pressure on the site.   Increase activity slowly   Complete by: As directed        Discharge Medications   Allergies as of 12/09/2020       Reactions   Fenofibrate Other (See Comments)   elevated LFTs   Ondansetron Other (See Comments)   drug interaction        Medication List     TAKE these medications    acetaminophen 650 MG CR tablet Commonly known as: TYLENOL Take 1,300 mg by mouth every 8 (eight) hours as needed for pain.   amLODipine 10 MG tablet Commonly known as: NORVASC Take 1 tablet (10 mg total) by mouth daily. Start taking on: December 10, 2020   BIOTIN PO Take 10,000 Units by mouth daily with breakfast.   cyanocobalamin 1000 MCG tablet Take 1,000 mcg by mouth daily.   dofetilide 125 MCG capsule Commonly known as: TIKOSYN TAKE ONE CAPSULE BY MOUTH TWICE A DAY   Eliquis 5 MG Tabs tablet Generic drug: apixaban Take 1 tablet (5 mg total) by mouth 2 (two) times daily.   ferrous sulfate 325 (65 FE) MG tablet Take 325 mg by mouth daily with breakfast.   furosemide 40 MG tablet Commonly known as: LASIX TAKE 1 TABLET(40 MG) BY MOUTH DAILY What changed: See the new instructions.   gabapentin 300 MG capsule Commonly known as: NEURONTIN Take 300 mg by mouth 2 (two) times daily.   LORazepam 1 MG tablet Commonly known as: ATIVAN Take 1 mg by mouth at bedtime.   losartan 100 MG tablet Commonly known as: COZAAR Take 100 mg by mouth daily.   magnesium oxide 400 (241.3 Mg) MG tablet Commonly known as: MAG-OX Take 1 tablet (400 mg total) by mouth daily.   Melatonin 10 MG Tabs Take 1 tablet by mouth as needed (sleep).   omeprazole 20 MG capsule Commonly known as: PRILOSEC Take 20 mg by  mouth daily.   OVER THE COUNTER MEDICATION Take 6 capsules by mouth daily. Balance of nature 3 capsules of fruit and 3 capsules of vegetables   potassium chloride 10 MEQ tablet Commonly known as: KLOR-CON Take 4 tablets (40 mEq total) by mouth daily.   pyridOXINE 100 MG tablet Commonly known as: VITAMIN B-6 Take 100 mg by mouth daily.   rosuvastatin 40 MG tablet Commonly known as: CRESTOR Take 1 tablet (40 mg total) by mouth daily at 6 PM.   vitamin C 1000 MG tablet Take 1,000 mg by mouth daily.   Vitamin D-3 25 MCG (1000 UT) Caps Take 1,000 Units by mouth daily with breakfast.   vitamin E 180 MG (400 UNITS) capsule Take 400 Units by mouth daily.         Outstanding Labs/Studies   N/a   Duration of Discharge Encounter   Greater than 30 minutes including physician time.  Signed, Reino Bellis, NP 12/09/2020, 11:45 AM

## 2020-12-09 NOTE — Progress Notes (Signed)
Mobility Specialist Progress Note    12/09/20 1103  Mobility  Activity Ambulated in hall  Level of Assistance Independent  Assistive Device None  Distance Ambulated (ft) 515 ft  Mobility Ambulated independently in hallway  Mobility Response Tolerated well  Mobility performed by Mobility specialist  $Mobility charge 1 Mobility   Pt received in bed and agreeable. No complaints during. HR at 59-60 midway through. Slight SOB towards end of walk. Returned to bed with call bell in reach.   Hildred Alamin Mobility Specialist  Mobility Specialist Phone: [phone]

## 2020-12-10 ENCOUNTER — Encounter: Payer: Self-pay | Admitting: *Deleted

## 2020-12-10 DIAGNOSIS — R778 Other specified abnormalities of plasma proteins: Secondary | ICD-10-CM | POA: Diagnosis not present

## 2020-12-10 DIAGNOSIS — I10 Essential (primary) hypertension: Secondary | ICD-10-CM | POA: Diagnosis not present

## 2020-12-10 DIAGNOSIS — Z006 Encounter for examination for normal comparison and control in clinical research program: Secondary | ICD-10-CM

## 2020-12-10 DIAGNOSIS — I248 Other forms of acute ischemic heart disease: Secondary | ICD-10-CM

## 2020-12-10 DIAGNOSIS — I7091 Generalized atherosclerosis: Secondary | ICD-10-CM

## 2020-12-10 DIAGNOSIS — I214 Non-ST elevation (NSTEMI) myocardial infarction: Secondary | ICD-10-CM | POA: Diagnosis not present

## 2020-12-10 LAB — CBC
HCT: 33 % — ABNORMAL LOW (ref 36.0–46.0)
Hemoglobin: 11 g/dL — ABNORMAL LOW (ref 12.0–15.0)
MCH: 30.6 pg (ref 26.0–34.0)
MCHC: 33.3 g/dL (ref 30.0–36.0)
MCV: 91.7 fL (ref 80.0–100.0)
Platelets: 231 10*3/uL (ref 150–400)
RBC: 3.6 MIL/uL — ABNORMAL LOW (ref 3.87–5.11)
RDW: 12.7 % (ref 11.5–15.5)
WBC: 9.4 10*3/uL (ref 4.0–10.5)
nRBC: 0 % (ref 0.0–0.2)

## 2020-12-10 MED ORDER — POTASSIUM CHLORIDE CRYS ER 10 MEQ PO TBCR
20.0000 meq | EXTENDED_RELEASE_TABLET | Freq: Once | ORAL | Status: AC
  Administered 2020-12-10: 20 meq via ORAL
  Filled 2020-12-10: qty 2

## 2020-12-10 MED ORDER — MECLIZINE HCL 12.5 MG PO TABS: 12.5000 mg | ORAL_TABLET | Freq: Every day | ORAL | 0 refills | Status: AC | PRN

## 2020-12-10 NOTE — Research (Signed)
Subject # [id number] [employer] Lp(a) [id number] Site # [phone]  SEX []    Female                      [x]    Female  Ethnicity []   Hispanic or Latino   [x]   Not Hispanic or Latino  Race [x]   White                 []   Black or African American  []   Asian []   American [place] or [place] Native            []   Native Hawaiian or Other Pacific Islander                      []   Other  Other   Age 85  Subject Group [x]    Local Lab           []   Historical Lp(a) value                                       Results:  Future research [x]    Yes                    []   No    [employer] [id number] Site # [id number] Subject ID # [id number]         ELIGIBILITY CRITERIA WORKSHEET INCLUSION CRITERIA   Subject has provided informed consent prior to the initiation of any study specific activities/procedures [x]   Age 62 to 47 years [x]   MI (presumed type 1) OR [x]   PCI (with high-risk features) with at least 1 of the following: []   Age >59 []   Diabetes mellitus  HbA1c: []   History of ischemic stroke []   History of peripheral arterial disease []   Residual stenosis ? 50% []   Multivessel PCI (ie, ? 2 vessels, including branch arteries []   EXCLUSIONS THE FOLLOWING N/A []   Subjects known to be currently receiving investigational drug in a clinical study that is anticipated to last > 1 year []   Known Lp(a) value 90mg /dL or < 200nmol/L []   Subject has a diagnosis of end-stage renal disease or requires dialysis. []   Poorly controlled (glycated hemoglobin [HbA1c] > 10%) diabetes mellitus (type 1 or type 2) []   Subject is receiving or has received lipoprotein apheresis to reduce Lp(a) within 3 months prior to enrollment. []   Known uncontrolled or recurrent ventricular tachycardia in the past 3 months prior to enrollment. []   Known malignancy (except non-melanoma skin cancers, cervical in situ carcinoma, breast ductal carcinoma in situ, or stage 1 prostate carcinoma) within the last 5 years prior to enrollment. []   Known history  or evidence of clinically significant disease (eg, respiratory, gastrointestinal, or psychiatric disease) or unstable disorder or biomarker that, in the opinion of the investigator(s), would result in life expectancy < 5 years. []   Known hemorrhagic stroke. []    [employer] Lp(a) Informed Consent   Subject Name: Sheila Gardner  Subject met inclusion and exclusion criteria.  The informed consent form, study requirements and expectations were reviewed with the subject and questions and concerns were addressed prior to the signing of the consent form.  The subject verbalized understanding of the trial requirements.  The subject agreed to participate in the [facility] Lp(a) trial and signed the informed consent at 1000 on 12/10/20.  The informed consent was obtained prior to  performance of any protocol-specific procedures for the subject.  A copy of the signed informed consent was given to the subject and a copy was placed in the subject's medical record.   Chanda Busing  [employer] Consent Version 2 Protocol Version 2

## 2020-12-10 NOTE — Discharge Summary (Signed)
Discharge Summary    Patient ID: Sheila Gardner MRN: [id number]; DOB: [birth year]  Admit date: 12/07/2020 Discharge date: 12/10/2020  PCP:  Lawerance Cruel, MD   [place] [facility] Cardiologist:  Sinclair Grooms, MD  Electrophysiologist:  Cristopher Peru, MD   Discharge Diagnoses    Principal Problem:   NSTEMI (non-ST elevated myocardial infarction) [facility]) Active Problems:   Hyperlipidemia LDL goal <70   Hypertension   Persistent atrial fibrillation ([place])   Elevated troponin  Diagnostic Studies/Procedures    Cath: 12/08/20  Dist Cx lesion is 50% stenosed.   Mid LAD-1 lesion is 40% stenosed with 50% stenosed side branch in 2nd Diag.   Dist LAD lesion is 30% stenosed.   Mid LAD-2 lesion is 30% stenosed.   Previously placed Dist LM to Mid LAD stent (unknown type) is  widely patent.   The left ventricular systolic function is normal.   LV end diastolic pressure is normal.   The left ventricular ejection fraction is 55-65% by visual estimate.   Widely patent ostial to proximal LAD stent at site of prior atherectomy/DES stenting with 40 to 50% LAD stenoses beyond the stented segment and 30% mid and mid distal stenosis; large codominant left circumflex vessel with 50% stenosis after the OM 2 vessel in the AV groove circumflex and normal RCA.   Normal LV function without focal segmental wall motion abnormalities with EF estimated 55 to 60%.  LVEDP 14 mmHg   RECOMMENDATION: Eliquis can be resumed in a.m.  Medical therapy for concomitant CAD.  Rest of lipid-lowering therapy with target LDL less than 70.   Diagnostic Dominance: Co-dominant   Echo: 12/07/20  IMPRESSIONS     1. Left ventricular ejection fraction, by estimation, is 65 to 70%. Left  ventricular ejection fraction by 3D volume is 67 %. The left ventricle has  normal function. The left ventricle has no regional wall motion  abnormalities. There is severe left  ventricular hypertrophy of the basal-septal  segment. Left ventricular  diastolic parameters are indeterminate. Elevated left ventricular  end-diastolic pressure. The average left ventricular global longitudinal  strain is -22.2 %. The global longitudinal  strain is normal.   2. Right ventricular systolic function is normal. The right ventricular  size is normal. Tricuspid regurgitation signal is inadequate for assessing  PA pressure.   3. The mitral valve is normal in structure. Trivial mitral valve  regurgitation. No evidence of mitral stenosis.   4. The aortic valve is tricuspid. Aortic valve regurgitation is trivial.  Mild aortic valve sclerosis is present, with no evidence of aortic valve  stenosis.   5. The inferior vena cava is normal in size with greater than 50%  respiratory variability, suggesting right atrial pressure of 3 mmHg.   FINDINGS   Left Ventricle: Left ventricular ejection fraction, by estimation, is 65  to 70%. Left ventricular ejection fraction by 3D volume is 67 %. The left  ventricle has normal function. The left ventricle has no regional wall  motion abnormalities. The average  left ventricular global longitudinal strain is -22.2 %. The global  longitudinal strain is normal. The left ventricular internal cavity size  was normal in size. There is severe left ventricular hypertrophy of the  basal-septal segment. Left ventricular  diastolic parameters are indeterminate. Elevated left ventricular  end-diastolic pressure.   Right Ventricle: The right ventricular size is normal. No increase in  right ventricular wall thickness. Right ventricular systolic function is  normal. Tricuspid regurgitation signal is inadequate  for assessing PA  pressure.   Left Atrium: Left atrial size was normal in size.   Right Atrium: Right atrial size was normal in size.   Pericardium: There is no evidence of pericardial effusion.   Mitral Valve: The mitral valve is normal in structure. Mild to moderate  mitral annular  calcification. Trivial mitral valve regurgitation. No  evidence of mitral valve stenosis.   Tricuspid Valve: The tricuspid valve is normal in structure. Tricuspid  valve regurgitation is not demonstrated. No evidence of tricuspid  stenosis.   Aortic Valve: The aortic valve is tricuspid. Aortic valve regurgitation is  trivial. Mild aortic valve sclerosis is present, with no evidence of  aortic valve stenosis.   Pulmonic Valve: The pulmonic valve was normal in structure. Pulmonic valve  regurgitation is trivial. No evidence of pulmonic stenosis.   Aorta: The aortic root is normal in size and structure.   Venous: The inferior vena cava is normal in size with greater than 50%  respiratory variability, suggesting right atrial pressure of 3 mmHg.   IAS/Shunts: No atrial level shunt detected by color flow Doppler.  _____________   History of Present Illness     Sheila Gardner is a 85 y.o. female with HTN, HLD, DM II, RBBB, CAD, heart murmur, mild sinus node dysfunction with intermittent asymptomatic bradycardia, h/o syncope s/p ILR and h/o persistent afib on dofetilide who presented on 10/2.  Sheila Gardner is a 85 year old female with past medical history of HTN, HLD, DM II, RBBB, CAD, heart murmur, mild sinus node dysfunction with intermittent asymptomatic bradycardia, h/o syncope s/p ILR and h/o persistent afib on dofetilide.  Echocardiogram obtained on 12/27/2018 showed EF 65 to 70%, trace MR, mild aortic valve sclerosis without stenosis.  Patient underwent DCCV on 02/19/2019.  Coronary CT obtained on 04/23/2019 showed at least moderate 50 to 69% plaque in the proximal to mid LAD, due to heavy calcification, this may be inconclusive.  Subsequent FFR came back showing significant proximal LAD stenosis with FFR less than 0.5, no significant stenosis in left main, left circumflex or RCA based on FFR study.  Patient ultimately underwent cardiac catheterization on 04/30/2019 that revealed proximal to mid  LAD lesions around 50%, 80% and 70% stenosis.  All 3 lesions were treated with a single 2.75 x 38 mm Onyx DES postdilated to 3.25 mm in diameter.  Distal to the stented area, there was 50 to 60% mid LAD residual.  There was also 60% mid left circumflex residual as well.  EF and LVEDP were normal at the time of cath.  Postprocedure, patient was placed on triple therapy with plan to drop aspirin after 30 days.  She was on reduced dose of dofetilide due to QT prolongation.  She has a history of asymptomatic bradycardia with heart rate down to the 40s, given lack of symptom, continued observation was recommended by Dr. Lovena Le.  She has orthostatic dizziness when she stands up too quickly however no recent presyncope or sudden onset of dizziness without change in the body position.  Patient was last seen by Dr. [name] in February 2022 at which time she was doing well, she was noted to have a heart murmur which was likely progressive aortic disease, outpatient echocardiogram was ordered and scheduled for 12/29/2020.   Patient was in her usual state of health until Friday night 12/05/2020 when she started having nausea and central chest pressure.  She denies any worsening shortness of breath or dizziness.  Symptom worsened by Saturday evening.  She checked her blood pressure and noted her blood pressure was quite high at 215/90s.  This prompted her to call EMS and seek medical attention at [facility].  On arrival, blood pressure was 185/69 with heart rate ranges from mid 40s to low 50s.  Sodium mildly low at 130.  Creatinine normal, hemoglobin 11.4.  Serial troponin 37--> 170.  Cardiology service consulted for possible NSTEMI.  Nitropatch has been placed on the patient.  Last dose of Eliquis prior to ED arrival was around 6:30 PM last night.      Hospital Course     NSTEMI/Demand ischemia: hsTn peaked at 308. Underwent cardiac cath noted above with widely patent pLAD stent, 50% stenosis beyond stented  segment. No culprit lesion noted for troponin elevation. Continue medical therapy. Suspect her significantly elevated BP contributed to leak of cardiac markers. No recurrent chest pain. Follow up echo showed EF of 65-70% with no rWMA.    Hypertensive urgency: Patient self-reported blood pressure was 215/90s the night prior to admission.  On arrival to the ED, systolic blood pressure was over 180s.  She has been compliant with medication at home including 100 mg losartan.   -- added amlodipine 10mg  daily with improvement. Avoid AVN blocking agents with bradycardia. Continued on home ARB. -- will have her keep log of BPs and bring to follow up appt  -- also complained of headache/nausea which improved with addition of meclizine. Will give 30 day supply at discharge as we are limited on nausea treatment options of tikosyn.   Hyponatremia: Unclear cause, based on blood work, she was also hyponatremic a year ago.  She is on 40 mg daily of Lasix but no thiazide diuretic. Na+ 133 on discharge    CAD: Stent to proximal to mid LAD in February 2021.  At the time he had 50 to 60% mid LAD and a 60% mid left circumflex residuals. -- stent mLAD stent on cath, will continue on plavix, no ASA with need for Eliquis   Hyperlipidemia: On Crestor 40 mg daily   Heart murmur: Echo this admission without evidence of significant valvular disease.   Mild sinus node dysfunction: intermittent asymptomatic bradycardia. Noted to have rates in the upper 30s-40s while at rest. No high degree AVB noted.  -- she is on tikosyn at 151mcg BID in the setting of QT prolongation -- will arrange for close follow up with EP to determine plan going forward   History of syncope s/p implantable loop recorder: No recent syncope.     Persistent A. fib on dofetilide: History of cardioversions in December 2020.  A. fib is well suppressed on low-dose dofetilide.  General: Well developed, well nourished, female appearing in no acute  distress. Head: Normocephalic, atraumatic.  Neck: Supple without bruits, JVD. Lungs:  Resp regular and unlabored, CTA. Heart: RRR, S1, S2, no S3, S4, soft systolic murmur; no rub. Abdomen: Soft, non-tender, non-distended with normoactive bowel sounds. No hepatomegaly. No rebound/guarding. No obvious abdominal masses. Extremities: No clubbing, cyanosis, edema. Distal pedal pulses are 2+ bilaterally. Right cath site stable without bruising or hematoma Neuro: Alert and oriented X 3. Moves all extremities spontaneously. Psych: Normal affect.  Patient was seen by Dr. Martinique and deemed stable for discharge home. Follow up in the office has been arranged.  Did the patient have an acute coronary syndrome (MI, NSTEMI, STEMI, etc) this admission?:  No.   The elevated Troponin was due to the acute medical illness (demand ischemia).  _____________  Discharge Vitals Blood pressure (!) 148/44, pulse (!) 55, temperature 98.7 F (37.1 C), temperature source Oral, resp. rate 18, height 5\' 4"  (1.626 m), weight 82.6 kg, SpO2 98 %.  Filed Weights   12/08/20 0442 12/09/20 0454 12/10/20 0415  Weight: 82 kg 82 kg 82.6 kg    Labs & Radiologic Studies    CBC Recent Labs    12/09/20 0258 12/10/20 0323  WBC 7.6 9.4  HGB 11.2* 11.0*  HCT 32.8* 33.0*  MCV 91.1 91.7  PLT 230 025   Basic Metabolic Panel Recent Labs    12/07/20 1700 12/08/20 0307 12/09/20 0258  NA  --  132* 133*  K  --  3.4* 3.9  CL  --  96* 101  CO2  --  27 24  GLUCOSE  --  115* 95  BUN  --  9 11  CREATININE  --  0.70 0.63  CALCIUM  --  9.4 8.9  MG 1.9  --  2.2   Liver Function Tests No results for input(s): AST, ALT, ALKPHOS, BILITOT, PROT, ALBUMIN in the last 72 hours. No results for input(s): LIPASE, AMYLASE in the last 72 hours. High Sensitivity Troponin:   Recent Labs  Lab 12/07/20 0101 12/07/20 0252 12/07/20 0916 12/07/20 1052  TROPONINIHS 37* 170* 308* 321*    BNP Invalid input(s): POCBNP D-Dimer No  results for input(s): DDIMER in the last 72 hours. Hemoglobin A1C Recent Labs    12/08/20 0307  HGBA1C 6.2*   Fasting Lipid Panel Recent Labs    12/08/20 0307  CHOL 124  HDL 57  LDLCALC 44  TRIG 115  CHOLHDL 2.2   Thyroid Function Tests No results for input(s): TSH, T4TOTAL, T3FREE, THYROIDAB in the last 72 hours.  Invalid input(s): FREET3 _____________  DG Chest 2 View  Result Date: 12/07/2020 CLINICAL DATA:  Central chest pressure. EXAM: CHEST - 2 VIEW COMPARISON:  None. FINDINGS: The heart size and mediastinal contours are within normal limits. A radiopaque loop recorder device is noted. Both lungs are clear. The visualized skeletal structures are unremarkable. IMPRESSION: No active cardiopulmonary disease. Electronically Signed   By: Virgina Norfolk M.D.   On: 12/07/2020 01:15   CARDIAC CATHETERIZATION  Result Date: 12/08/2020   Dist Cx lesion is 50% stenosed.   Mid LAD-1 lesion is 40% stenosed with 50% stenosed side branch in 2nd Diag.   Dist LAD lesion is 30% stenosed.   Mid LAD-2 lesion is 30% stenosed.   Previously placed Dist LM to Mid LAD stent (unknown type) is  widely patent.   The left ventricular systolic function is normal.   LV end diastolic pressure is normal.   The left ventricular ejection fraction is 55-65% by visual estimate. Widely patent ostial to proximal LAD stent at site of prior atherectomy/DES stenting with 40 to 50% LAD stenoses beyond the stented segment and 30% mid and mid distal stenosis; large codominant left circumflex vessel with 50% stenosis after the OM 2 vessel in the AV groove circumflex and normal RCA. Normal LV function without focal segmental wall motion abnormalities with EF estimated 55 to 60%.  LVEDP 14 mmHg RECOMMENDATION: Eliquis can be resumed in a.m.  Medical therapy for concomitant CAD.  Rest of lipid-lowering therapy with target LDL less than 70.   ECHOCARDIOGRAM COMPLETE  Result Date: 12/07/2020    ECHOCARDIOGRAM REPORT   Patient  Name:   Sheila Gardner Date of Exam: 12/07/2020 Medical Rec #:  [id number]     Height:  65.0 in Accession #:    [id number]    Weight:       186.5 lb Date of Birth:  09/22/1935     BSA:          1.920 m Patient Age:    23 years      BP:           161/55 mmHg Patient Gender: F             HR:           49 bpm. Exam Location:  Inpatient Procedure: 2D Echo, Cardiac Doppler and Color Doppler Indications:    Chest Pain R07.9  History:        Patient has prior history of Echocardiogram examinations, most                 recent 12/27/2018. Arrythmias:Atrial Fibrillation,                 Signs/Symptoms:Murmur; Risk Factors:Hypertension and                 Dyslipidemia. GERD.  Sonographer:    Darlina Sicilian RDCS Referring Phys: [id number] HAO MENG IMPRESSIONS  1. Left ventricular ejection fraction, by estimation, is 65 to 70%. Left ventricular ejection fraction by 3D volume is 67 %. The left ventricle has normal function. The left ventricle has no regional wall motion abnormalities. There is severe left ventricular hypertrophy of the basal-septal segment. Left ventricular diastolic parameters are indeterminate. Elevated left ventricular end-diastolic pressure. The average left ventricular global longitudinal strain is -22.2 %. The global longitudinal strain is normal.  2. Right ventricular systolic function is normal. The right ventricular size is normal. Tricuspid regurgitation signal is inadequate for assessing PA pressure.  3. The mitral valve is normal in structure. Trivial mitral valve regurgitation. No evidence of mitral stenosis.  4. The aortic valve is tricuspid. Aortic valve regurgitation is trivial. Mild aortic valve sclerosis is present, with no evidence of aortic valve stenosis.  5. The inferior vena cava is normal in size with greater than 50% respiratory variability, suggesting right atrial pressure of 3 mmHg. FINDINGS  Left Ventricle: Left ventricular ejection fraction, by estimation, is 65 to 70%. Left  ventricular ejection fraction by 3D volume is 67 %. The left ventricle has normal function. The left ventricle has no regional wall motion abnormalities. The average left ventricular global longitudinal strain is -22.2 %. The global longitudinal strain is normal. The left ventricular internal cavity size was normal in size. There is severe left ventricular hypertrophy of the basal-septal segment. Left ventricular diastolic parameters are indeterminate. Elevated left ventricular end-diastolic pressure. Right Ventricle: The right ventricular size is normal. No increase in right ventricular wall thickness. Right ventricular systolic function is normal. Tricuspid regurgitation signal is inadequate for assessing PA pressure. Left Atrium: Left atrial size was normal in size. Right Atrium: Right atrial size was normal in size. Pericardium: There is no evidence of pericardial effusion. Mitral Valve: The mitral valve is normal in structure. Mild to moderate mitral annular calcification. Trivial mitral valve regurgitation. No evidence of mitral valve stenosis. Tricuspid Valve: The tricuspid valve is normal in structure. Tricuspid valve regurgitation is not demonstrated. No evidence of tricuspid stenosis. Aortic Valve: The aortic valve is tricuspid. Aortic valve regurgitation is trivial. Mild aortic valve sclerosis is present, with no evidence of aortic valve stenosis. Pulmonic Valve: The pulmonic valve was normal in structure. Pulmonic valve regurgitation is trivial. No evidence of pulmonic stenosis. Aorta: The aortic  root is normal in size and structure. Venous: The inferior vena cava is normal in size with greater than 50% respiratory variability, suggesting right atrial pressure of 3 mmHg. IAS/Shunts: No atrial level shunt detected by color flow Doppler.  LEFT VENTRICLE PLAX 2D LVIDd:         4.80 cm         Diastology LVIDs:         3.40 cm         LV e' medial:    5.03 cm/s LV PW:         1.00 cm         LV E/e' medial:   22.3 LV IVS:        0.90 cm         LV e' lateral:   8.25 cm/s LVOT diam:     1.90 cm         LV E/e' lateral: 13.6 LV SV:         85 LV SV Index:   44              2D LVOT Area:     2.84 cm        Longitudinal                                Strain                                2D Strain GLS  -22.5 %                                (A2C):                                2D Strain GLS  -22.0 %                                (A3C):                                2D Strain GLS  -21.9 %                                (A4C):                                2D Strain GLS  -22.2 %                                Avg:                                 3D Volume EF                                LV 3D EF:    Left  ventricul                                             ar                                             ejection                                             fraction                                             by 3D                                             volume is                                             67 %.                                 3D Volume EF:                                3D EF:        67 %                                LV EDV:       116 ml                                LV ESV:       38 ml                                LV SV:        78 ml RIGHT VENTRICLE RV S prime:     12.60 cm/s TAPSE (M-mode): 2.4 cm LEFT ATRIUM             Index       RIGHT ATRIUM           Index LA diam:        4.10 cm 2.14 cm/m  RA Area:     12.10 cm LA Vol (A2C):   36.7 ml 19.11 ml/m RA Volume:   22.80 ml  11.87 ml/m LA Vol (A4C):   44.9 ml 23.38 ml/m LA Biplane Vol: 42.9 ml 22.34 ml/m  AORTIC VALVE LVOT Vmax:   124.00 cm/s LVOT Vmean:  78.600 cm/s LVOT VTI:    0.300  m  AORTA Ao Root diam: 3.40 cm Ao Asc diam:  3.50 cm MITRAL VALVE MV Area (PHT): 2.77 cm     SHUNTS MV Decel Time: 274 msec     Systemic VTI:  0.30 m MV E velocity: 112.00 cm/s  Systemic Diam: 1.90 cm MV A  velocity: 119.00 cm/s MV E/A ratio:  0.94 Fransico Him MD Electronically signed by Fransico Him MD Signature Date/Time: 12/07/2020/12:45:45 PM    Final    Disposition   Pt is being discharged home today in good condition.  Follow-up Plans & Appointments     Follow-up Information     Imogene Burn, PA-C Follow up on 12/31/2020.   Specialty: Cardiology Why: at 10:45am for your follow up appt with Dr. Thompson Caul PA Contact information: [address] [place] [address] [phone]         Lawerance Cruel, MD. Go on 12/22/2020.   Specialty: Family Medicine Why: @10 :00AM Contact information: [address] [place] [address] [phone]         Evans Lance, MD Follow up on 12/16/2020.   Specialty: Cardiology Why: at 9:30am for your follow up appt. Contact information: [address] [place] [address] [phone]                Discharge Instructions     Call MD for:  difficulty breathing, headache or visual disturbances   Complete by: As directed    Call MD for:  persistant dizziness or light-headedness   Complete by: As directed    Call MD for:  redness, tenderness, or signs of infection (pain, swelling, redness, odor or green/yellow discharge around incision site)   Complete by: As directed    Diet - low sodium heart healthy   Complete by: As directed    Discharge instructions   Complete by: As directed    Radial Site Care Refer to this sheet in the next few weeks. These instructions provide you with information on caring for yourself after your procedure. Your caregiver may also give you more specific instructions. Your treatment has been planned according to current medical practices, but problems sometimes occur. Call your caregiver if you have any problems or questions after your procedure. HOME CARE INSTRUCTIONS You may shower the day after the procedure. Remove the bandage (dressing) and gently  wash the site with plain soap and water. Gently pat the site dry.  Do not apply powder or lotion to the site.  Do not submerge the affected site in water for 3 to 5 days.  Inspect the site at least twice daily.  Do not flex or bend the affected arm for 24 hours.  No lifting over 5 pounds (2.3 kg) for 5 days after your procedure.  Do not drive home if you are discharged the same day of the procedure. Have someone else drive you.  You may drive 24 hours after the procedure unless otherwise instructed by your caregiver.  What to expect: Any bruising will usually fade within 1 to 2 weeks.  Blood that collects in the tissue (hematoma) may be painful to the touch. It should usually decrease in size and tenderness within 1 to 2 weeks.  SEEK IMMEDIATE MEDICAL CARE IF: You have unusual pain at the radial site.  You have redness, warmth, swelling, or pain at the radial site.  You have drainage (other than a small amount of blood on the dressing).  You have chills.  You  have a fever or persistent symptoms for more than 72 hours.  You have a fever and your symptoms suddenly get worse.  Your arm becomes pale, cool, tingly, or numb.  You have heavy bleeding from the site. Hold pressure on the site.   Increase activity slowly   Complete by: As directed        Discharge Medications   Allergies as of 12/10/2020       Reactions   Fenofibrate Other (See Comments)   elevated LFTs   Ondansetron Other (See Comments)   drug interaction        Medication List     TAKE these medications    acetaminophen 650 MG CR tablet Commonly known as: TYLENOL Take 1,300 mg by mouth every 8 (eight) hours as needed for pain.   amLODipine 10 MG tablet Commonly known as: NORVASC Take 1 tablet (10 mg total) by mouth daily.   BIOTIN PO Take 10,000 Units by mouth daily with breakfast.   cyanocobalamin 1000 MCG tablet Take 1,000 mcg by mouth daily.   dofetilide 125 MCG capsule Commonly known as:  TIKOSYN TAKE ONE CAPSULE BY MOUTH TWICE A DAY   Eliquis 5 MG Tabs tablet Generic drug: apixaban Take 1 tablet (5 mg total) by mouth 2 (two) times daily.   ferrous sulfate 325 (65 FE) MG tablet Take 325 mg by mouth daily with breakfast.   furosemide 40 MG tablet Commonly known as: LASIX TAKE 1 TABLET(40 MG) BY MOUTH DAILY What changed: See the new instructions.   gabapentin 300 MG capsule Commonly known as: NEURONTIN Take 300 mg by mouth 2 (two) times daily.   LORazepam 1 MG tablet Commonly known as: ATIVAN Take 1 mg by mouth at bedtime.   losartan 100 MG tablet Commonly known as: COZAAR Take 100 mg by mouth daily.   magnesium oxide 400 (241.3 Mg) MG tablet Commonly known as: MAG-OX Take 1 tablet (400 mg total) by mouth daily.   meclizine 12.5 MG tablet Commonly known as: ANTIVERT Take 1 tablet (12.5 mg total) by mouth daily as needed for dizziness.   Melatonin 10 MG Tabs Take 1 tablet by mouth as needed (sleep).   omeprazole 20 MG capsule Commonly known as: PRILOSEC Take 20 mg by mouth daily.   OVER THE COUNTER MEDICATION Take 6 capsules by mouth daily. Balance of nature 3 capsules of fruit and 3 capsules of vegetables   potassium chloride 10 MEQ tablet Commonly known as: KLOR-CON Take 4 tablets (40 mEq total) by mouth daily.   pyridOXINE 100 MG tablet Commonly known as: VITAMIN B-6 Take 100 mg by mouth daily.   rosuvastatin 40 MG tablet Commonly known as: CRESTOR Take 1 tablet (40 mg total) by mouth daily at 6 PM.   vitamin C 1000 MG tablet Take 1,000 mg by mouth daily.   Vitamin D-3 25 MCG (1000 UT) Caps Take 1,000 Units by mouth daily with breakfast.   vitamin E 180 MG (400 UNITS) capsule Take 400 Units by mouth daily.        Outstanding Labs/Studies   N/a   Duration of Discharge Encounter   Greater than 30 minutes including physician time.  Signed, Reino Bellis, NP 12/10/2020, 10:14 AM

## 2020-12-10 NOTE — Progress Notes (Signed)
Mobility Specialist Progress Note    12/10/20 1030  Mobility  Activity Ambulated in hall  Level of Assistance Standby assist, set-up cues, supervision of patient - no hands on  Assistive Device None  Distance Ambulated (ft) 440 ft  Mobility Ambulated independently in hallway  Mobility Response Tolerated well  Mobility performed by Mobility specialist  Bed Position Chair  $Mobility charge 1 Mobility   Pre-Mobility: 51 HR, 124/45 BP During Mobility: 64 HR Post-Mobility: 55 HR, 127/50 BP  Pt received in bed and agreeable. No complaints on walk. Took one short standing break at end of hall to practice pursed lip breathing d/t feeling slight SOB. Returned to chair with call bell in reach.   Hildred Alamin Mobility Specialist  Mobility Specialist Phone: [phone]

## 2020-12-11 LAB — CUP PACEART REMOTE DEVICE CHECK
Date Time Interrogation Session: 20221005183423
Implantable Pulse Generator Implant Date: 20211109

## 2020-12-11 LAB — LIPOPROTEIN A (LPA): Lipoprotein (a): 9.2 nmol/L (ref <75.0)

## 2020-12-11 NOTE — Research (Signed)
Consent for [employer] L[employer] was signed by Lily Lovings RN with the patient on 12/10/2020

## 2020-12-12 DIAGNOSIS — R519 Headache, unspecified: Secondary | ICD-10-CM | POA: Diagnosis not present

## 2020-12-12 DIAGNOSIS — M549 Dorsalgia, unspecified: Secondary | ICD-10-CM | POA: Diagnosis not present

## 2020-12-12 NOTE — Telephone Encounter (Signed)
Leaving pt 2 boxes of Eliquis 5 mg tablets at the front desk for pt to pick up.   LOT: [id number]   EXP: 07/2022

## 2020-12-12 NOTE — Telephone Encounter (Signed)
**Note De-Identified Sheila Gardner Obfuscation** Pt calling for update on her BMSPA[employer] application for Eliquis. I advised her that we have not heard back from them and that she should call them to check the progress of her application.  Also, she states she is out of Eliquis. She is advised that we are leaving her 2 weeks of Eliquis 5 mg samples up front for her to pick up at Dr Darliss Ridgel office at [facility] at [address]	[place], [address] in [place].

## 2020-12-15 ENCOUNTER — Ambulatory Visit (INDEPENDENT_AMBULATORY_CARE_PROVIDER_SITE_OTHER): Payer: Medicare Other

## 2020-12-15 ENCOUNTER — Telehealth: Payer: Self-pay | Admitting: Internal Medicine

## 2020-12-15 DIAGNOSIS — I4819 Other persistent atrial fibrillation: Secondary | ICD-10-CM | POA: Diagnosis not present

## 2020-12-15 NOTE — Telephone Encounter (Signed)
Pt is returning call regarding her red/swollen feet. Please advise pt further

## 2020-12-15 NOTE — Telephone Encounter (Signed)
Pt d/c'ed from [facility] on 10/5 after NSTEMI.  States yesterday she felt her feet tingling and looked down and both feet and ankles were swollen and red.  Does have some heat at touch.  Called PCP and they couldn't see her.  Swelling and redness resolved overnight but returned today once pt got up moving around.  BP this morning was 147/71. Noticed that Amlodipine 10mg  was added at recent hospitalization.  Advised pt to reduce to 5mg  and keep appt with Dr. Lovena Le tomorrow.  Advised pt if he feels this is the likely culprit, we can send in a new prescription. Pt verbalized understanding and was appreciative for call.

## 2020-12-15 NOTE — Telephone Encounter (Signed)
Pt c/o swelling: STAT is pt has developed SOB within 24 hours  If swelling, where is the swelling located? feet  How much weight have you gained and in what time span? Has not noticed a weight gain  Have you gained 3 pounds in a day or 5 pounds in a week? no  Do you have a log of your daily weights (if so, list)? No, does weigh herself, but has not noticed a change  Are you currently taking a fluid pill? yes  Are you currently SOB? no  Have you traveled recently? no   Patient states yesterday her feet turned red and started swelling. She states they are also burning. She states she has also been nauseas and went to the hospital for it, but they could not come to a conclusion of what caused it.

## 2020-12-15 NOTE — Telephone Encounter (Signed)
Left message to call back  

## 2020-12-16 ENCOUNTER — Other Ambulatory Visit: Payer: Self-pay

## 2020-12-16 ENCOUNTER — Ambulatory Visit (INDEPENDENT_AMBULATORY_CARE_PROVIDER_SITE_OTHER): Payer: Medicare Other | Admitting: Internal Medicine

## 2020-12-16 VITALS — BP 148/64 | HR 54 | Ht 63.75 in | Wt 186.0 lb

## 2020-12-16 DIAGNOSIS — R55 Syncope and collapse: Secondary | ICD-10-CM

## 2020-12-16 DIAGNOSIS — I4819 Other persistent atrial fibrillation: Secondary | ICD-10-CM

## 2020-12-16 DIAGNOSIS — I2510 Atherosclerotic heart disease of native coronary artery without angina pectoris: Secondary | ICD-10-CM | POA: Diagnosis not present

## 2020-12-16 DIAGNOSIS — I10 Essential (primary) hypertension: Secondary | ICD-10-CM

## 2020-12-16 MED ORDER — AMLODIPINE BESYLATE 5 MG PO TABS: 5.0000 mg | ORAL_TABLET | Freq: Every day | ORAL | 3 refills | Status: DC

## 2020-12-16 NOTE — Progress Notes (Signed)
HPI [name] returns [place] for followup. She is a pleasant 85 yo woman with PAF who has been maintained in NSR on dofetilide. She has an ILR. She denies palpitations. She was started on amlodipine after being found to have worsening HTN and she notes that her legs and feet have been swollen and red. They are painful. She denies sodium indiscretion. She has rare palpitations. She has not lost weight Allergies  Allergen Reactions   Fenofibrate Other (See Comments)    elevated LFTs   Ondansetron Other (See Comments)    drug interaction     Current Outpatient Medications  Medication Sig Dispense Refill   acetaminophen (TYLENOL) 650 MG CR tablet Take 1,300 mg by mouth every 8 (eight) hours as needed for pain.     amLODipine (NORVASC) 10 MG tablet Take 1 tablet (10 mg total) by mouth daily. 90 tablet 1   Ascorbic Acid (VITAMIN C) 1000 MG tablet Take 1,000 mg by mouth daily.      BIOTIN PO Take 10,000 Units by mouth daily with breakfast.      Cholecalciferol (VITAMIN D-3) 1000 UNITS CAPS Take 1,000 Units by mouth daily with breakfast.      cyanocobalamin 1000 MCG tablet Take 1,000 mcg by mouth daily.      dofetilide (TIKOSYN) 125 MCG capsule TAKE ONE CAPSULE BY MOUTH TWICE A DAY (Patient taking differently: Take 125 mcg by mouth 2 (two) times daily.) 60 capsule 11   ELIQUIS 5 MG TABS tablet Take 1 tablet (5 mg total) by mouth 2 (two) times daily. 60 tablet 5   ferrous sulfate 325 (65 FE) MG tablet Take 325 mg by mouth daily with breakfast.     furosemide (LASIX) 40 MG tablet TAKE 1 TABLET(40 MG) BY MOUTH DAILY (Patient taking differently: Take 40 mg by mouth daily.) 90 tablet 1   gabapentin (NEURONTIN) 300 MG capsule Take 300 mg by mouth 2 (two) times daily.     LORazepam (ATIVAN) 1 MG tablet Take 1 mg by mouth at bedtime.     losartan (COZAAR) 100 MG tablet Take 100 mg by mouth daily.      magnesium oxide (MAG-OX) 400 (241.3 Mg) MG tablet Take 1 tablet (400 mg total) by mouth daily.  90 tablet 3   meclizine (ANTIVERT) 12.5 MG tablet Take 1 tablet (12.5 mg total) by mouth daily as needed for dizziness. 30 tablet 0   omeprazole (PRILOSEC) 20 MG capsule Take 20 mg by mouth daily.     OVER THE COUNTER MEDICATION Take 6 capsules by mouth daily. Balance of nature 3 capsules of fruit and 3 capsules of vegetables     potassium chloride (KLOR-CON) 10 MEQ tablet Take 4 tablets (40 mEq total) by mouth daily. 120 tablet 6   pyridOXINE (VITAMIN B-6) 100 MG tablet Take 100 mg by mouth daily.     rosuvastatin (CRESTOR) 40 MG tablet Take 1 tablet (40 mg total) by mouth daily at 6 PM. 30 tablet 6   vitamin E 400 UNIT capsule Take 400 Units by mouth daily.     No current facility-administered medications for this visit.     Past Medical History:  Diagnosis Date   Arthritis    Atrial fibrillation (HCC)    Breast cancer ([place])    Cancer ([place])    Breast- rt   Cough with sputum 2016   Elevated liver function tests 05/09/2013   GERD (gastroesophageal reflux disease)    COSTOCHONDRITIS   Heart  murmur    Hip pain 05/08/2014   Hyperlipidemia    Hypertension    Lipoma    back of neck   PONV (postoperative nausea and vomiting)    only after rt hip surgery   Primary osteoarthritis of left knee 11/04/2015   Primary osteoarthritis of right hip 07/30/2014   Squamous cell carcinoma of skin 08/21/2019   atypical squa. proliferation-Left corner of mouth    ROS:   All systems reviewed and negative except as noted in the HPI.   Past Surgical History:  Procedure Laterality Date   ANKLE FRACTURE SURGERY Right 2013   RIGHT    BREAST LUMPECTOMY Right 2011   BREAST SURGERY  04/22/2009   Rt lumpectomy   CARDIOVERSION N/A 02/19/2019   Procedure: CARDIOVERSION;  Surgeon: Dorothy Spark, MD;  Location: [facility];  Service: Cardiovascular;  Laterality: N/A;   CORONARY ATHERECTOMY N/A 04/30/2019   Procedure: CORONARY ATHERECTOMY;  Surgeon: Belva Crome, MD;  Location: [facility];   Service: Cardiovascular;  Laterality: N/A;  LAD   CORONARY STENT INTERVENTION N/A 04/30/2019   Procedure: CORONARY STENT INTERVENTION;  Surgeon: Belva Crome, MD;  Location: [facility];  Service: Cardiovascular;  Laterality: N/A;  DES TO PROX-MID LAD   EYE SURGERY  2001   macular hole   JOINT REPLACEMENT  2016   KNEE SURGERY     LEFT HEART CATH AND CORONARY ANGIOGRAPHY N/A 04/30/2019   Procedure: LEFT HEART CATH AND CORONARY ANGIOGRAPHY;  Surgeon: Belva Crome, MD;  Location: [facility];  Service: Cardiovascular;  Laterality: N/A;   LEFT HEART CATH AND CORONARY ANGIOGRAPHY N/A 12/08/2020   Procedure: LEFT HEART CATH AND CORONARY ANGIOGRAPHY;  Surgeon: Troy Sine, MD;  Location: [facility];  Service: Cardiovascular;  Laterality: N/A;   MIDDLE EAR SURGERY Left 08/18/2015   repair eardrum and reconstruction   OVARY SURGERY  40 years ago   wedge   TOTAL HIP ARTHROPLASTY Right 07/30/2014   Procedure: TOTAL HIP ARTHROPLASTY ANTERIOR APPROACH;  Surgeon: Melrose Nakayama, MD;  Location: [facility];  Service: Orthopedics;  Laterality: Right;   TOTAL KNEE ARTHROPLASTY Left 11/04/2015   TOTAL KNEE ARTHROPLASTY Left 11/04/2015   Procedure: TOTAL KNEE ARTHROPLASTY;  Surgeon: Melrose Nakayama, MD;  Location: [facility];  Service: Orthopedics;  Laterality: Left;   TYMPANOPLASTY  30 years ago     Family History  Problem Relation Age of Onset   Heart disease Mother    Cancer Brother        colon, pancreatic, brain     Social History   Socioeconomic History   Marital status: Widowed    Spouse name: Not on file   Number of children: Not on file   Years of education: Not on file   Highest education level: Not on file  Occupational History   Not on file  Tobacco Use   Smoking status: Never   Smokeless tobacco: Never  Vaping Use   Vaping Use: Never used  Substance and Sexual Activity   Alcohol use: Not Currently   Drug use: No   Sexual activity: Not Currently    Birth  control/protection: None  Other Topics Concern   Not on file  Social History Narrative   Not on file   Social Determinants of Health   Financial Resource Strain: Not on file  Food Insecurity: Not on file  Transportation Needs: Not on file  Physical Activity: Not on file  Stress: Not  on file  Social Connections: Not on file  Intimate Partner Violence: Not on file     BP (!) 148/64   Pulse (!) 54   Ht 5' 3.75" (1.619 m)   Wt 186 lb (84.4 kg)   BMI 32.18 kg/m   Physical Exam:  Well appearing NAD HEENT: Unremarkable Neck:  No JVD, no thyromegally Lymphatics:  No adenopathy Back:  No CVA tenderness Lungs:  Clear with no wheezes HEART:  Regular rate rhythm, no murmurs, no rubs, no clicks Abd:  soft, positive bowel sounds, no organomegally, no rebound, no guarding Ext:  2 plus pulses, 1+ edema, no cyanosis, no clubbing Skin:  No rashes no nodules Neuro:  CN II through XII intact, motor grossly intact  EKG - nsr  Assess/Plan:  PAF - she is maintaining NSR. She will continue dofetilide. HTN - her bp is ok. I have asked her to reduce her dose of amlodipine. Sinus node dysfunction - she is asymptomatic.  Syncope - she has not had any and will undergo watchful waiting.  Sheila Overlie Rutilio Yellowhair,MD

## 2020-12-16 NOTE — Patient Instructions (Addendum)
Medication Instructions:  Your physician has recommended you make the following change in your medication:    REDUCE your amlodipine-  Take 5 mg by mouth once a day   Labwork: None ordered.  Testing/Procedures: None ordered.  Follow-Up: Your physician wants you to follow-up in: 6 months with Cristopher Peru, MD -  loop recorder  Remote monthly monitoring is used to monitor loop recorder from home.   Any Other Special Instructions Will Be Listed Below (If Applicable).  If you need a refill on your cardiac medications before your next appointment, please call your pharmacy.

## 2020-12-22 DIAGNOSIS — R350 Frequency of micturition: Secondary | ICD-10-CM | POA: Diagnosis not present

## 2020-12-22 DIAGNOSIS — I219 Acute myocardial infarction, unspecified: Secondary | ICD-10-CM | POA: Diagnosis not present

## 2020-12-22 DIAGNOSIS — I10 Essential (primary) hypertension: Secondary | ICD-10-CM | POA: Diagnosis not present

## 2020-12-22 DIAGNOSIS — R8290 Unspecified abnormal findings in urine: Secondary | ICD-10-CM | POA: Diagnosis not present

## 2020-12-22 DIAGNOSIS — Z09 Encounter for follow-up examination after completed treatment for conditions other than malignant neoplasm: Secondary | ICD-10-CM | POA: Diagnosis not present

## 2020-12-22 DIAGNOSIS — I214 Non-ST elevation (NSTEMI) myocardial infarction: Secondary | ICD-10-CM | POA: Diagnosis not present

## 2020-12-23 NOTE — Progress Notes (Signed)
Carelink Summary Report / Loop Recorder 

## 2020-12-26 ENCOUNTER — Telehealth: Payer: Self-pay | Admitting: Interventional Cardiology

## 2020-12-26 NOTE — Telephone Encounter (Signed)
Will route to Dr. [name] for review.

## 2020-12-26 NOTE — Telephone Encounter (Signed)
Will route to triage to contact pt when Dr. [name] replies as I will be out of the office next week.

## 2020-12-26 NOTE — Telephone Encounter (Signed)
Patient would like to know if it is safe for her to have an injection in her wrist for carpel tunnel.

## 2020-12-29 ENCOUNTER — Ambulatory Visit (HOSPITAL_COMMUNITY): Payer: Medicare Other

## 2020-12-29 NOTE — Progress Notes (Signed)
Cardiology Office Note    Date:  12/31/2020   ID:  Sheila Gardner, DOB 1935/10/29, MRN [id number]   PCP:  Lawerance Cruel, MD   [place]  Cardiologist:  Sinclair Grooms, MD   Advanced Practice Provider:  No care team member to display Electrophysiologist:  Cristopher Peru, MD   [phone]   Chief Complaint  Patient presents with   Hospitalization Follow-up     History of Present Illness:  Sheila Gardner is a 85 y.o. female  with HTN, HLD, DM II, RBBB, CAD, heart murmur, mild sinus node dysfunction with intermittent asymptomatic bradycardia, h/o syncope s/p ILR and h/o persistent afib on dofetilide    Patient underwent DCCV on 02/19/2019.  Coronary CT obtained on 04/23/2019 showed at least moderate 50 to 69% plaque in the proximal to mid LAD, due to heavy calcification, this may be inconclusive.  Subsequent FFR came back showing significant proximal LAD stenosis with FFR less than 0.5, no significant stenosis in left main, left circumflex or RCA based on FFR study.  Patient ultimately underwent cardiac catheterization on 04/30/2019 that revealed proximal to mid LAD lesions around 50%, 80% and 70% stenosis.  All 3 lesions were treated with a single 2.75 x 38 mm Onyx DES postdilated to 3.25 mm in diameter.  Distal to the stented area, there was 50 to 60% mid LAD residual.  There was also 60% mid left circumflex residual as well.  EF and LVEDP were normal at the time of cath.  Postprocedure, patient was placed on triple therapy with plan to drop aspirin after 30 days.  She was on reduced dose of dofetilide due to QT prolongation.  She has a history of asymptomatic bradycardia with heart rate down to the 40s, given lack of symptom, continued observation was recommended by Dr. Lovena Le.  Admitted with NSTEMI 12/08/2020 in the setting of hypertensive urgency peak troponin 308 cardiac cath widely patent LAD stent, 50% stenosis beyond the stented segment no culprit lesion  for troponin elevation medical therapy recommended.  Suspect her significantly elevated blood pressure contributed to leak of cardiac markers.  Echo normal LVEF 65 to 70% no wall motion abnormality.  Amlodipine 10 mg added but reduced to 5 mg daily by Dr. Lovena Le for edema, avoid AV nodal blocking agents with bradycardia.  She was also hyponatremic unclear cause. Echo yest unchanged.  Patient comes in for f/u accompanied by her son. Multiple questions answered. Getting a lot of salt in her diet. Some loneliness living alone on the weekend but keeps busy during the week.   Past Medical History:  Diagnosis Date   Arthritis    Atrial fibrillation (HCC)    Breast cancer ([place])    Cancer ([place])    Breast- rt   Cough with sputum 2016   Elevated liver function tests 05/09/2013   GERD (gastroesophageal reflux disease)    COSTOCHONDRITIS   Heart murmur    Hip pain 05/08/2014   Hyperlipidemia    Hypertension    Lipoma    back of neck   PONV (postoperative nausea and vomiting)    only after rt hip surgery   Primary osteoarthritis of left knee 11/04/2015   Primary osteoarthritis of right hip 07/30/2014   Squamous cell carcinoma of skin 08/21/2019   atypical squa. proliferation-Left corner of mouth    Past Surgical History:  Procedure Laterality Date   ANKLE FRACTURE SURGERY Right 2013   RIGHT    BREAST LUMPECTOMY  Right 2011   BREAST SURGERY  04/22/2009   Rt lumpectomy   CARDIOVERSION N/A 02/19/2019   Procedure: CARDIOVERSION;  Surgeon: Dorothy Spark, MD;  Location: [facility];  Service: Cardiovascular;  Laterality: N/A;   CORONARY ATHERECTOMY N/A 04/30/2019   Procedure: CORONARY ATHERECTOMY;  Surgeon: Belva Crome, MD;  Location: [facility];  Service: Cardiovascular;  Laterality: N/A;  LAD   CORONARY STENT INTERVENTION N/A 04/30/2019   Procedure: CORONARY STENT INTERVENTION;  Surgeon: Belva Crome, MD;  Location: [facility];  Service: Cardiovascular;  Laterality: N/A;   DES TO PROX-MID LAD   EYE SURGERY  2001   macular hole   JOINT REPLACEMENT  2016   KNEE SURGERY     LEFT HEART CATH AND CORONARY ANGIOGRAPHY N/A 04/30/2019   Procedure: LEFT HEART CATH AND CORONARY ANGIOGRAPHY;  Surgeon: Belva Crome, MD;  Location: [facility];  Service: Cardiovascular;  Laterality: N/A;   LEFT HEART CATH AND CORONARY ANGIOGRAPHY N/A 12/08/2020   Procedure: LEFT HEART CATH AND CORONARY ANGIOGRAPHY;  Surgeon: Troy Sine, MD;  Location: [facility];  Service: Cardiovascular;  Laterality: N/A;   MIDDLE EAR SURGERY Left 08/18/2015   repair eardrum and reconstruction   OVARY SURGERY  40 years ago   wedge   TOTAL HIP ARTHROPLASTY Right 07/30/2014   Procedure: TOTAL HIP ARTHROPLASTY ANTERIOR APPROACH;  Surgeon: Melrose Nakayama, MD;  Location: [facility];  Service: Orthopedics;  Laterality: Right;   TOTAL KNEE ARTHROPLASTY Left 11/04/2015   TOTAL KNEE ARTHROPLASTY Left 11/04/2015   Procedure: TOTAL KNEE ARTHROPLASTY;  Surgeon: Melrose Nakayama, MD;  Location: [facility];  Service: Orthopedics;  Laterality: Left;   TYMPANOPLASTY  30 years ago    Current Medications: Current Meds  Medication Sig   acetaminophen (TYLENOL) 650 MG CR tablet Take 1,300 mg by mouth every 8 (eight) hours as needed for pain.   Ascorbic Acid (VITAMIN C) 1000 MG tablet Take 1,000 mg by mouth daily.    BIOTIN PO Take 10,000 Units by mouth daily with breakfast.    Cholecalciferol (VITAMIN D-3) 1000 UNITS CAPS Take 1,000 Units by mouth daily with breakfast.    cyanocobalamin 1000 MCG tablet Take 1,000 mcg by mouth daily.    dofetilide (TIKOSYN) 125 MCG capsule TAKE ONE CAPSULE BY MOUTH TWICE A DAY (Patient taking differently: Take 125 mcg by mouth 2 (two) times daily.)   ELIQUIS 5 MG TABS tablet Take 1 tablet (5 mg total) by mouth 2 (two) times daily.   ferrous sulfate 325 (65 FE) MG tablet Take 325 mg by mouth daily with breakfast.   furosemide (LASIX) 40 MG tablet TAKE 1 TABLET(40 MG) BY MOUTH DAILY  (Patient taking differently: Take 40 mg by mouth daily.)   gabapentin (NEURONTIN) 300 MG capsule Take 300 mg by mouth 2 (two) times daily.   hydrALAZINE (APRESOLINE) 25 MG tablet Take 1 tablet (25 mg total) by mouth in the morning and at bedtime.   LORazepam (ATIVAN) 1 MG tablet Take 1 mg by mouth at bedtime.   losartan (COZAAR) 100 MG tablet Take 100 mg by mouth daily.    magnesium oxide (MAG-OX) 400 (241.3 Mg) MG tablet Take 1 tablet (400 mg total) by mouth daily.   meclizine (ANTIVERT) 12.5 MG tablet Take 1 tablet (12.5 mg total) by mouth daily as needed for dizziness.   omeprazole (PRILOSEC) 20 MG capsule Take 20 mg by mouth daily.   OVER THE COUNTER MEDICATION Take 6 capsules  by mouth daily. Balance of nature 3 capsules of fruit and 3 capsules of vegetables   potassium chloride (KLOR-CON) 10 MEQ tablet Take 4 tablets (40 mEq total) by mouth daily.   pyridOXINE (VITAMIN B-6) 100 MG tablet Take 100 mg by mouth daily.   rosuvastatin (CRESTOR) 40 MG tablet Take 1 tablet (40 mg total) by mouth daily at 6 PM.   vitamin E 400 UNIT capsule Take 400 Units by mouth daily.   [DISCONTINUED] amLODipine (NORVASC) 5 MG tablet Take 1 tablet (5 mg total) by mouth daily.     Allergies:   Fenofibrate and Ondansetron   Social History   Socioeconomic History   Marital status: Widowed    Spouse name: Not on file   Number of children: Not on file   Years of education: Not on file   Highest education level: Not on file  Occupational History   Not on file  Tobacco Use   Smoking status: Never   Smokeless tobacco: Never  Vaping Use   Vaping Use: Never used  Substance and Sexual Activity   Alcohol use: Not Currently   Drug use: No   Sexual activity: Not Currently    Birth control/protection: None  Other Topics Concern   Not on file  Social History Narrative   Not on file   Social Determinants of Health   Financial Resource Strain: Not on file  Food Insecurity: Not on file  Transportation  Needs: Not on file  Physical Activity: Not on file  Stress: Not on file  Social Connections: Not on file     Family History:  The patient's  family history includes Cancer in her brother; Heart disease in her mother.   ROS:   Please see the history of present illness.    ROS All other systems reviewed and are negative.   PHYSICAL EXAM:   VS:  BP 138/66   Pulse (!) 57   Ht 5' 3.75" (1.619 m)   Wt 187 lb 3.2 oz (84.9 kg)   SpO2 91%   BMI 32.39 kg/m   Physical Exam  GEN: Obese, in no acute distress  Neck: no JVD, carotid bruits, or masses Cardiac:RRR; no murmurs, rubs, or gallops  Respiratory:  clear to auscultation bilaterally, normal work of breathing GI: soft, nontender, nondistended, + BS Ext: without cyanosis, clubbing, or edema, Good distal pulses bilaterally Neuro:  Alert and Oriented x 3 Psych: euthymic mood, full affect  Wt Readings from Last 3 Encounters:  12/31/20 187 lb 3.2 oz (84.9 kg)  12/16/20 186 lb (84.4 kg)  12/10/20 182 lb 3.2 oz (82.6 kg)      Studies/Labs Reviewed:   EKG:  EKG is not ordered today.    Recent Labs: 12/09/2020: BUN 11; Creatinine, Ser 0.63; Magnesium 2.2; Potassium 3.9; Sodium 133 12/10/2020: Hemoglobin 11.0; Platelets 231   Lipid Panel    Component Value Date/Time   CHOL 124 12/08/2020 0307   TRIG 115 12/08/2020 0307   HDL 57 12/08/2020 0307   CHOLHDL 2.2 12/08/2020 0307   VLDL 23 12/08/2020 0307   LDLCALC 44 12/08/2020 0307    Additional studies/ records that were reviewed today include:    Dist Cx lesion is 50% stenosed.   Mid LAD-1 lesion is 40% stenosed with 50% stenosed side branch in 2nd Diag.   Dist LAD lesion is 30% stenosed.   Mid LAD-2 lesion is 30% stenosed.   Previously placed Dist LM to Mid LAD stent (unknown type) is  widely patent.  The left ventricular systolic function is normal.   LV end diastolic pressure is normal.   The left ventricular ejection fraction is 55-65% by visual estimate.   Widely  patent ostial to proximal LAD stent at site of prior atherectomy/DES stenting with 40 to 50% LAD stenoses beyond the stented segment and 30% mid and mid distal stenosis; large codominant left circumflex vessel with 50% stenosis after the OM 2 vessel in the AV groove circumflex and normal RCA.   Normal LV function without focal segmental wall motion abnormalities with EF estimated 55 to 60%.  LVEDP 14 mmHg   RECOMMENDATION: Eliquis can be resumed in a.m.  Medical therapy for concomitant CAD.  Rest of lipid-lowering therapy with target LDL less than 70.  IMPRESSIONS     1. Left ventricular ejection fraction, by estimation, is 65 to 70%. Left  ventricular ejection fraction by 3D volume is 67 %. The left ventricle has  normal function. The left ventricle has no regional wall motion  abnormalities. There is severe left  ventricular hypertrophy of the basal-septal segment. Left ventricular  diastolic parameters are indeterminate. Elevated left ventricular  end-diastolic pressure. The average left ventricular global longitudinal  strain is -22.2 %. The global longitudinal  strain is normal.   2. Right ventricular systolic function is normal. The right ventricular  size is normal. Tricuspid regurgitation signal is inadequate for assessing  PA pressure.   3. The mitral valve is normal in structure. Trivial mitral valve  regurgitation. No evidence of mitral stenosis.   4. The aortic valve is tricuspid. Aortic valve regurgitation is trivial.  Mild aortic valve sclerosis is present, with no evidence of aortic valve  stenosis.   5. The inferior vena cava is normal in size with greater than 50%  respiratory variability, suggesting right atrial pressure of 3 mmHg.   FINDINGS   Left Ventricle: Left ventricular ejection fraction, by estimation, is 65  to 70%. Left ventricular ejection fraction by 3D volume is 67 %. The left  ventricle has normal function. The left ventricle has no regional wall   motion abnormalities. The average  left ventricular global longitudinal strain is -22.2 %. The global  longitudinal strain is normal. The left ventricular internal cavity size  was normal in size. There is severe left ventricular hypertrophy of the  basal-septal segment. Left ventricular  diastolic parameters are indeterminate. Elevated left ventricular  end-diastolic pressure.   Right Ventricle: The right ventricular size is normal. No increase in  right ventricular wall thickness. Right ventricular systolic function is  normal. Tricuspid regurgitation signal is inadequate for assessing PA  pressure.   Left Atrium: Left atrial size was normal in size.   Right Atrium: Right atrial size was normal in size.   Pericardium: There is no evidence of pericardial effusion.   Mitral Valve: The mitral valve is normal in structure. Mild to moderate  mitral annular calcification. Trivial mitral valve regurgitation. No  evidence of mitral valve stenosis.   Tricuspid Valve: The tricuspid valve is normal in structure. Tricuspid  valve regurgitation is not demonstrated. No evidence of tricuspid  stenosis.   Aortic Valve: The aortic valve is tricuspid. Aortic valve regurgitation is  trivial. Mild aortic valve sclerosis is present, with no evidence of  aortic valve stenosis.   Pulmonic Valve: The pulmonic valve was normal in structure. Pulmonic valve  regurgitation is trivial. No evidence of pulmonic stenosis.   Aorta: The aortic root is normal in size and structure.   Venous: The  inferior vena cava is normal in size with greater than 50%  respiratory variability, suggesting right atrial pressure of 3 mmHg.   IAS/Shunts: No atrial level shunt detected by color flow Doppler.       Risk Assessment/Calculations:    CHA2DS2-VASc Score = 5   This indicates a 7.2% annual risk of stroke. The patient's score is based upon: CHF History: 0 HTN History: 1 Diabetes History: 0 Stroke History:  0 Vascular Disease History: 1 Age Score: 2 Gender Score: 1        ASSESSMENT:    1. Coronary artery disease involving native coronary artery of native heart without angina pectoris   2. Primary hypertension   3. Sinus node dysfunction (HCC)   4. Persistent atrial fibrillation ([place])   5. History of syncope      PLAN:  In order of problems listed above:  CAD status post DES to the LAD 04/2019 residual 60% mid circumflex, NSTEMI 12/08/2020 widely patent LAD stent 50% beyond the stented segment no culprit lesion.  NSTEMI felt secondary to elevated blood pressure contributing to leak of cardiac markers normal LVEF 65 to 70% on echo. No chest pain  Hypertensive urgency amlodipine 10 mg daily added with improvement but developed edema so decreased to 5 mg. Swelling hasn't improved.Getting extra salt in her diet. Will stop amlodipine and start hydralazine 25 mg bid. 2 gm sodium diet.   Avoid AV nodal blocking agents with bradycardia, continue ARB  Mild sinus node dysfunction with intermittent asymptomatic bradycardia rates in the upper 30s to 40s at rest on Tikosyn  Persistent atrial fibrillation on Tikosyn cardioversion 02/2019  History of syncope status post loop recorder no recurrent syncope  Shared Decision Making/Informed Consent        Medication Adjustments/Labs and Tests Ordered: Current medicines are reviewed at length with the patient today.  Concerns regarding medicines are outlined above.  Medication changes, Labs and Tests ordered today are listed in the Patient Instructions below. Patient Instructions  Medication Instructions:  1) Stop Amlodipine   2) Start Hydralazine 25 mg 2 times a day   *If you need a refill on your cardiac medications before your next appointment, please call your pharmacy*   Lab Work: None ordered   If you have labs (blood work) drawn today and your tests are completely normal, you will receive your results only by: [place] [facility] (if  you have MyChart) OR A paper copy in the mail If you have any lab test that is abnormal or we need to change your treatment, we will call you to review the results.   Testing/Procedures: None ordered    Follow-Up: Follow up as scheduled     Other Instructions Two Gram Sodium Diet 2000 mg  What is Sodium? Sodium is a mineral found naturally in many foods. The most significant source of sodium in the diet is table salt, which is about 40% sodium.  Processed, convenience, and preserved foods also contain a large amount of sodium.  The body needs only 500 mg of sodium daily to function,  A normal diet provides more than enough sodium even if you do not use salt.  Why Limit Sodium? A build up of sodium in the body can cause thirst, increased blood pressure, shortness of breath, and water retention.  Decreasing sodium in the diet can reduce edema and risk of heart attack or stroke associated with high blood pressure.  Keep in mind that there are many other factors involved in these health  problems.  Heredity, obesity, lack of exercise, cigarette smoking, stress and what you eat all play a role.  General Guidelines: Do not add salt at the table or in cooking.  One teaspoon of salt contains over 2 grams of sodium. Read food labels Avoid processed and convenience foods Ask your dietitian before eating any foods not dicussed in the menu planning guidelines Consult your physician if you wish to use a salt substitute or a sodium containing medication such as antacids.  Limit milk and milk products to 16 oz (2 cups) per day.  Shopping Hints: READ LABELS!! "Dietetic" does not necessarily mean low sodium. Salt and other sodium ingredients are often added to foods during processing.    Menu Planning Guidelines Food Group Choose More Often Avoid  Beverages (see also the milk group All fruit juices, low-sodium, salt-free vegetables juices, low-sodium carbonated beverages Regular vegetable or tomato  juices, commercially softened water used for drinking or cooking  Breads and Cereals Enriched white, wheat, rye and pumpernickel bread, hard rolls and dinner rolls; muffins, cornbread and waffles; most dry cereals, cooked cereal without added salt; unsalted crackers and breadsticks; low sodium or homemade bread crumbs Bread, rolls and crackers with salted tops; quick breads; instant hot cereals; pancakes; commercial bread stuffing; self-rising flower and biscuit mixes; regular bread crumbs or cracker crumbs  Desserts and Sweets Desserts and sweets mad with mild should be within allowance Instant pudding mixes and cake mixes  Fats Butter or margarine; vegetable oils; unsalted salad dressings, regular salad dressings limited to 1 Tbs; light, sour and heavy cream Regular salad dressings containing bacon fat, bacon bits, and salt pork; snack dips made with instant soup mixes or processed cheese; salted nuts  Fruits Most fresh, frozen and canned fruits Fruits processed with salt or sodium-containing ingredient (some dried fruits are processed with sodium sulfites        Vegetables Fresh, frozen vegetables and low- sodium canned vegetables Regular canned vegetables, sauerkraut, pickled vegetables, and others prepared in brine; frozen vegetables in sauces; vegetables seasoned with ham, bacon or salt pork  Condiments, Sauces, Miscellaneous  Salt substitute with physician's approval; pepper, herbs, spices; vinegar, lemon or lime juice; hot pepper sauce; garlic powder, onion powder, low sodium soy sauce (1 Tbs.); low sodium condiments (ketchup, chili sauce, mustard) in limited amounts (1 tsp.) fresh ground horseradish; unsalted tortilla chips, pretzels, potato chips, popcorn, salsa (1/4 cup) Any seasoning made with salt including garlic salt, celery salt, onion salt, and seasoned salt; sea salt, rock salt, kosher salt; meat tenderizers; monosodium glutamate; mustard, regular soy sauce, barbecue, sauce, chili  sauce, teriyaki sauce, steak sauce, Worcestershire sauce, and most flavored vinegars; canned gravy and mixes; regular condiments; salted snack foods, olives, picles, relish, horseradish sauce, catsup   Food preparation: Try these seasonings Meats:    Pork Sage, onion Serve with applesauce  Chicken Poultry seasoning, thyme, parsley Serve with cranberry sauce  Lamb Curry powder, rosemary, garlic, thyme Serve with mint sauce or jelly  Veal Marjoram, basil Serve with current jelly, cranberry sauce  Beef Pepper, bay leaf Serve with dry mustard, unsalted chive butter  Fish [place] leaf, dill Serve with unsalted lemon butter, unsalted parsley butter  Vegetables:    Asparagus Lemon juice   Broccoli Lemon juice   Carrots Mustard dressing parsley, mint, nutmeg, glazed with unsalted butter and sugar   Green beans Marjoram, lemon juice, nutmeg,dill seed   Tomatoes Basil, marjoram, onion   Spice /blend for [employer]" 4 tsp ground thyme 1 tsp  ground sage 3 tsp ground rosemary 4 tsp ground marjoram   Test your knowledge A product that says "Salt Free" may still contain sodium. True or False Garlic Powder and Hot Pepper Sauce an be used as alternative seasonings.True or False Processed foods have more sodium than fresh foods.  True or False Canned Vegetables have less sodium than froze True or False   WAYS TO DECREASE YOUR SODIUM INTAKE Avoid the use of added salt in cooking and at the table.  Table salt (and other prepared seasonings which contain salt) is probably one of the greatest sources of sodium in the diet.  Unsalted foods can gain flavor from the sweet, sour, and butter taste sensations of herbs and spices.  Instead of using salt for seasoning, try the following seasonings with the foods listed.  Remember: how you use them to enhance natural food flavors is limited only by your creativity... Allspice-Meat, fish, eggs, fruit, peas, red and yellow vegetables Almond Extract-Fruit baked  goods Anise Seed-Sweet breads, fruit, carrots, beets, cottage cheese, cookies (tastes like licorice) Basil-Meat, fish, eggs, vegetables, rice, vegetables salads, soups, sauces Bay Leaf-Meat, fish, stews, poultry Burnet-Salad, vegetables (cucumber-like flavor) Caraway Seed-Bread, cookies, cottage cheese, meat, vegetables, cheese, rice Cardamon-Baked goods, fruit, soups Celery Powder or seed-Salads, salad dressings, sauces, meatloaf, soup, bread.Do not use  celery salt Chervil-Meats, salads, fish, eggs, vegetables, cottage cheese (parsley-like flavor) Chili Power-Meatloaf, chicken cheese, corn, eggplant, egg dishes Chives-Salads cottage cheese, egg dishes, soups, vegetables, sauces Cilantro-Salsa, casseroles Cinnamon-Baked goods, fruit, pork, lamb, chicken, carrots Cloves-Fruit, baked goods, fish, pot roast, green beans, beets, carrots Coriander-Pastry, cookies, meat, salads, cheese (lemon-orange flavor) Cumin-Meatloaf, fish,cheese, eggs, cabbage,fruit pie (caraway flavor) [employer], fruit, eggs, fish, poultry, cottage cheese, vegetables Dill Seed-Meat, cottage cheese, poultry, vegetables, fish, salads, bread Fennel Seed-Bread, cookies, apples, pork, eggs, fish, beets, cabbage, cheese, Licorice-like flavor Garlic-(buds or powder) Salads, meat, poultry, fish, bread, butter, vegetables, potatoes.Do not  use garlic salt Ginger-Fruit, vegetables, baked goods, meat, fish, poultry Horseradish Root-Meet, vegetables, butter Lemon Juice or Extract-Vegetables, fruit, tea, baked goods, fish salads Mace-Baked goods fruit, vegetables, fish, poultry (taste like nutmeg) Maple Extract-Syrups Marjoram-Meat, chicken, fish, vegetables, breads, green salads (taste like Sage) Mint-Tea, lamb, sherbet, vegetables, desserts, carrots, cabbage Mustard, Dry or Seed-Cheese, eggs, meats, vegetables, poultry Nutmeg-Baked goods, fruit, chicken, eggs, vegetables, desserts Onion Powder-Meat, fish, poultry,  vegetables, cheese, eggs, bread, rice salads (Do not use   Onion salt) Orange Extract-Desserts, baked goods Oregano-Pasta, eggs, cheese, onions, pork, lamb, fish, chicken, vegetables, green salads Paprika-Meat, fish, poultry, eggs, cheese, vegetables Parsley Flakes-Butter, vegetables, meat fish, poultry, eggs, bread, salads (certain forms may   Contain sodium Pepper-Meat fish, poultry, vegetables, eggs Peppermint Extract-Desserts, baked goods Poppy Seed-Eggs, bread, cheese, fruit dressings, baked goods, noodles, vegetables, cottage  [employer], poultry, meat, fish, cauliflower, turnips,eggs bread Saffron-Rice, bread, veal, chicken, fish, eggs Sage-Meat, fish, poultry, onions, eggplant, tomateos, pork, stews Savory-Eggs, salads, poultry, meat, rice, vegetables, soups, pork Tarragon-Meat, poultry, fish, eggs, butter, vegetables (licorice-like flavor)  Thyme-Meat, poultry, fish, eggs, vegetables, (clover-like flavor), sauces, soups Tumeric-Salads, butter, eggs, fish, rice, vegetables (saffron-like flavor) Vanilla Extract-Baked goods, candy Vinegar-Salads, vegetables, meat marinades Walnut Extract-baked goods, candy   2. Choose your Foods Wisely   The following is a list of foods to avoid which are high in sodium:  Meats-Avoid all smoked, canned, salt cured, dried and kosher meat and fish as well as Anchovies   [employer] meats:Bologna, Liverwurst, Pastrami Canned meat or fish  Marinated herring  Caviar    Pepperoni Corned Beef   Pizza Dried chipped beef  Salami Frozen breaded fish or meat Salt pork Frankfurters or hot dogs  Sardines Gefilte fish   Sausage Ham (boiled ham, Proscuitto Smoked butt    spiced ham)   Spam      TV Dinners Vegetables Canned vegetables (Regular) Relish Canned mushrooms  Sauerkraut Olives    Tomato juice Pickles  Bakery and Dessert Products Canned puddings  Cream pies Cheesecake   Decorated  cakes Cookies  Beverages/Juices Tomato juice, regular  Gatorade   V-8 vegetable juice, regular  Breads and Cereals Biscuit mixes   Salted potato chips, corn chips, pretzels Bread stuffing mixes  Salted crackers and rolls Pancake and waffle mixes Self-rising flour  Seasonings Accent    Meat sauces Barbecue sauce  Meat tenderizer Catsup    Monosodium glutamate (MSG) Celery salt   Onion salt Chili sauce   Prepared mustard Garlic salt   Salt, seasoned salt, sea salt Gravy mixes   Soy sauce Horseradish   Steak sauce Ketchup   Tartar sauce Lite salt    Teriyaki sauce Marinade mixes   Worcestershire sauce  Others Baking powder   Cocoa and cocoa mixes Baking soda   Commercial casserole mixes Candy-caramels, chocolate  Dehydrated soups    Bars, fudge,nougats  Instant rice and pasta mixes Canned broth or soup  Maraschino cherries Cheese, aged and processed cheese and cheese spreads  Learning Assessment Quiz  Indicated T (for True) or F (for False) for each of the following statements:  _____ Fresh fruits and vegetables and unprocessed grains are generally low in sodium _____ Water may contain a considerable amount of sodium, depending on the source _____ You can always tell if a food is high in sodium by tasting it _____ Certain laxatives my be high in sodium and should be avoided unless prescribed   by a physician or pharmacist _____ Salt substitutes may be used freely by anyone on a sodium restricted diet _____ Sodium is present in table salt, food additives and as a natural component of   most foods _____ Table salt is approximately 90% sodium _____ Limiting sodium intake may help prevent excess fluid accumulation in the body _____ On a sodium-restricted diet, seasonings such as bouillon soy sauce, and    cooking wine should be used in place of table salt _____ On an ingredient list, a product which lists monosodium glutamate as the first   ingredient is an appropriate food to  include on a low sodium diet  Circle the best answer(s) to the following statements (Hint: there may be more than one correct answer)  11. On a low-sodium diet, some acceptable snack items are:    A. Olives  F. Bean dip   K. Grapefruit juice    B. Salted Pretzels G. Commercial Popcorn   L. Canned peaches    C. Carrot Sticks  H. Bouillon   M. Unsalted nuts   D. [place] fries  I. Peanut butter crackers N. Salami   E. Sweet pickles J. Tomato Juice   O. Pizza  12.  Seasonings that may be used freely on a reduced - sodium diet include   A. Lemon wedges F.Monosodium glutamate K. Celery seed    B.Soysauce   G. Pepper   L. Mustard powder   C. Sea salt  H. Cooking wine  M. Onion flakes   D. Vinegar  E. Prepared horseradish N. Salsa   E. Sage   J. Worcestershire  sauce  O. [address]   Sumner Boast, PA-C  12/31/2020 11:30 AM    [facility] [place], [place], [place]  [address] Phone: [phone]; Fax: [phone]

## 2020-12-29 NOTE — Telephone Encounter (Signed)
Spoke with pt and advised per Dr [name] she may have the wrist injection for carpal tunnel and may hold Eliquis x 3 days if required by that provider.  Pt verbalizes understanding.  Pt states she is scheduled for an echo today ordered by Dr [name] last February.  Pt states she was admitted to hospital 12/07/20 for an NSTEMI and an echo was completed at that time.  She is asking if it is necessary for her to keep this appointment.  Pt advised Dr [name] is out of the office for the next 2 weeks.  She is asking if someone could weigh in on this for her.  Will forward to echo tech who pt is scheduled with to ask for assistance with this.  Pt verbalizes understanding and thanked Therapist, sports for the call.

## 2020-12-30 ENCOUNTER — Other Ambulatory Visit (HOSPITAL_COMMUNITY): Payer: Medicare Other

## 2020-12-30 ENCOUNTER — Ambulatory Visit (HOSPITAL_COMMUNITY): Payer: Medicare Other | Attending: Internal Medicine

## 2020-12-30 ENCOUNTER — Other Ambulatory Visit: Payer: Self-pay

## 2020-12-30 DIAGNOSIS — I4819 Other persistent atrial fibrillation: Secondary | ICD-10-CM

## 2020-12-30 LAB — ECHOCARDIOGRAM COMPLETE
AR max vel: 1.81 cm2
AV Area VTI: 1.75 cm2
AV Area mean vel: 1.78 cm2
AV Mean grad: 8.5 mmHg
AV Peak grad: 15.7 mmHg
Ao pk vel: 1.98 m/s
Area-P 1/2: 3.27 cm2
S' Lateral: 2.7 cm

## 2020-12-31 ENCOUNTER — Encounter: Payer: Self-pay | Admitting: Physician Assistant

## 2020-12-31 ENCOUNTER — Ambulatory Visit (INDEPENDENT_AMBULATORY_CARE_PROVIDER_SITE_OTHER): Payer: Medicare Other | Admitting: Physician Assistant

## 2020-12-31 VITALS — BP 138/66 | HR 57 | Ht 63.75 in | Wt 187.2 lb

## 2020-12-31 DIAGNOSIS — I495 Sick sinus syndrome: Secondary | ICD-10-CM

## 2020-12-31 DIAGNOSIS — M13849 Other specified arthritis, unspecified hand: Secondary | ICD-10-CM | POA: Diagnosis not present

## 2020-12-31 DIAGNOSIS — I2510 Atherosclerotic heart disease of native coronary artery without angina pectoris: Secondary | ICD-10-CM | POA: Diagnosis not present

## 2020-12-31 DIAGNOSIS — I10 Essential (primary) hypertension: Secondary | ICD-10-CM | POA: Diagnosis not present

## 2020-12-31 DIAGNOSIS — I4819 Other persistent atrial fibrillation: Secondary | ICD-10-CM

## 2020-12-31 DIAGNOSIS — Z87898 Personal history of other specified conditions: Secondary | ICD-10-CM

## 2020-12-31 DIAGNOSIS — G5603 Carpal tunnel syndrome, bilateral upper limbs: Secondary | ICD-10-CM | POA: Diagnosis not present

## 2020-12-31 MED ORDER — HYDRALAZINE HCL 25 MG PO TABS: 25.0000 mg | ORAL_TABLET | Freq: Two times a day (BID) | ORAL | 3 refills | Status: DC

## 2020-12-31 NOTE — Patient Instructions (Addendum)
Medication Instructions:  1) Stop Amlodipine   2) Start Hydralazine 25 mg 2 times a day   *If you need a refill on your cardiac medications before your next appointment, please call your pharmacy*   Lab Work: None ordered   If you have labs (blood work) drawn today and your tests are completely normal, you will receive your results only by: [place] (if you have MyChart) OR A paper copy in the mail If you have any lab test that is abnormal or we need to change your treatment, we will call you to review the results.   Testing/Procedures: None ordered    Follow-Up: Follow up as scheduled     Other Instructions Two Gram Sodium Diet 2000 mg  What is Sodium? Sodium is a mineral found naturally in many foods. The most significant source of sodium in the diet is table salt, which is about 40% sodium.  Processed, convenience, and preserved foods also contain a large amount of sodium.  The body needs only 500 mg of sodium daily to function,  A normal diet provides more than enough sodium even if you do not use salt.  Why Limit Sodium? A build up of sodium in the body can cause thirst, increased blood pressure, shortness of breath, and water retention.  Decreasing sodium in the diet can reduce edema and risk of heart attack or stroke associated with high blood pressure.  Keep in mind that there are many other factors involved in these health problems.  Heredity, obesity, lack of exercise, cigarette smoking, stress and what you eat all play a role.  General Guidelines: Do not add salt at the table or in cooking.  One teaspoon of salt contains over 2 grams of sodium. Read food labels Avoid processed and convenience foods Ask your dietitian before eating any foods not dicussed in the menu planning guidelines Consult your physician if you wish to use a salt substitute or a sodium containing medication such as antacids.  Limit milk and milk products to 16 oz (2 cups) per  day.  Shopping Hints: READ LABELS!! "Dietetic" does not necessarily mean low sodium. Salt and other sodium ingredients are often added to foods during processing.    Menu Planning Guidelines Food Group Choose More Often Avoid  Beverages (see also the milk group All fruit juices, low-sodium, salt-free vegetables juices, low-sodium carbonated beverages Regular vegetable or tomato juices, commercially softened water used for drinking or cooking  Breads and Cereals Enriched white, wheat, rye and pumpernickel bread, hard rolls and dinner rolls; muffins, cornbread and waffles; most dry cereals, cooked cereal without added salt; unsalted crackers and breadsticks; low sodium or homemade bread crumbs Bread, rolls and crackers with salted tops; quick breads; instant hot cereals; pancakes; commercial bread stuffing; self-rising flower and biscuit mixes; regular bread crumbs or cracker crumbs  Desserts and Sweets Desserts and sweets mad with mild should be within allowance Instant pudding mixes and cake mixes  Fats Butter or margarine; vegetable oils; unsalted salad dressings, regular salad dressings limited to 1 Tbs; light, sour and heavy cream Regular salad dressings containing bacon fat, bacon bits, and salt pork; snack dips made with instant soup mixes or processed cheese; salted nuts  Fruits Most fresh, frozen and canned fruits Fruits processed with salt or sodium-containing ingredient (some dried fruits are processed with sodium sulfites        Vegetables Fresh, frozen vegetables and low- sodium canned vegetables Regular canned vegetables, sauerkraut, pickled vegetables, and others prepared in brine;  frozen vegetables in sauces; vegetables seasoned with ham, bacon or salt pork  Condiments, Sauces, Miscellaneous  Salt substitute with physician's approval; pepper, herbs, spices; vinegar, lemon or lime juice; hot pepper sauce; garlic powder, onion powder, low sodium soy sauce (1 Tbs.); low sodium  condiments (ketchup, chili sauce, mustard) in limited amounts (1 tsp.) fresh ground horseradish; unsalted tortilla chips, pretzels, potato chips, popcorn, salsa (1/4 cup) Any seasoning made with salt including garlic salt, celery salt, onion salt, and seasoned salt; sea salt, rock salt, kosher salt; meat tenderizers; monosodium glutamate; mustard, regular soy sauce, barbecue, sauce, chili sauce, teriyaki sauce, steak sauce, Worcestershire sauce, and most flavored vinegars; canned gravy and mixes; regular condiments; salted snack foods, olives, picles, relish, horseradish sauce, catsup   Food preparation: Try these seasonings Meats:    Pork Sage, onion Serve with applesauce  Chicken Poultry seasoning, thyme, parsley Serve with cranberry sauce  Lamb Curry powder, rosemary, garlic, thyme Serve with mint sauce or jelly  Veal Marjoram, basil Serve with current jelly, cranberry sauce  Beef Pepper, bay leaf Serve with dry mustard, unsalted chive butter  Fish Bay leaf, dill Serve with unsalted lemon butter, unsalted parsley butter  Vegetables:    Asparagus Lemon juice   Broccoli Lemon juice   Carrots Mustard dressing parsley, mint, nutmeg, glazed with unsalted butter and sugar   Green beans Marjoram, lemon juice, nutmeg,dill seed   Tomatoes Basil, marjoram, onion   Spice /blend for [employer]" 4 tsp ground thyme 1 tsp ground sage 3 tsp ground rosemary 4 tsp ground marjoram   Test your knowledge A product that says "Salt Free" may still contain sodium. True or False Garlic Powder and Hot Pepper Sauce an be used as alternative seasonings.True or False Processed foods have more sodium than fresh foods.  True or False Canned Vegetables have less sodium than froze True or False   WAYS TO DECREASE YOUR SODIUM INTAKE Avoid the use of added salt in cooking and at the table.  Table salt (and other prepared seasonings which contain salt) is probably one of the greatest sources of sodium in the diet.   Unsalted foods can gain flavor from the sweet, sour, and butter taste sensations of herbs and spices.  Instead of using salt for seasoning, try the following seasonings with the foods listed.  Remember: how you use them to enhance natural food flavors is limited only by your creativity... Allspice-Meat, fish, eggs, fruit, peas, red and yellow vegetables Almond Extract-Fruit baked goods Anise Seed-Sweet breads, fruit, carrots, beets, cottage cheese, cookies (tastes like licorice) Basil-Meat, fish, eggs, vegetables, rice, vegetables salads, soups, sauces Bay Leaf-Meat, fish, stews, poultry Burnet-Salad, vegetables (cucumber-like flavor) Caraway Seed-Bread, cookies, cottage cheese, meat, vegetables, cheese, rice Cardamon-Baked goods, fruit, soups Celery Powder or seed-Salads, salad dressings, sauces, meatloaf, soup, bread.Do not use  celery salt Chervil-Meats, salads, fish, eggs, vegetables, cottage cheese (parsley-like flavor) Chili Power-Meatloaf, chicken cheese, corn, eggplant, egg dishes Chives-Salads cottage cheese, egg dishes, soups, vegetables, sauces Cilantro-Salsa, casseroles Cinnamon-Baked goods, fruit, pork, lamb, chicken, carrots Cloves-Fruit, baked goods, fish, pot roast, green beans, beets, carrots Coriander-Pastry, cookies, meat, salads, cheese (lemon-orange flavor) Cumin-Meatloaf, fish,cheese, eggs, cabbage,fruit pie (caraway flavor) [employer], fruit, eggs, fish, poultry, cottage cheese, vegetables Dill Seed-Meat, cottage cheese, poultry, vegetables, fish, salads, bread Fennel Seed-Bread, cookies, apples, pork, eggs, fish, beets, cabbage, cheese, Licorice-like flavor Garlic-(buds or powder) Salads, meat, poultry, fish, bread, butter, vegetables, potatoes.Do not  use garlic salt Ginger-Fruit, vegetables, baked goods, meat, fish, poultry Horseradish Root-Meet, vegetables, butter  Lemon Juice or Extract-Vegetables, fruit, tea, baked goods, fish salads Mace-Baked goods fruit,  vegetables, fish, poultry (taste like nutmeg) Maple Extract-Syrups Marjoram-Meat, chicken, fish, vegetables, breads, green salads (taste like Sage) Mint-Tea, lamb, sherbet, vegetables, desserts, carrots, cabbage Mustard, Dry or Seed-Cheese, eggs, meats, vegetables, poultry Nutmeg-Baked goods, fruit, chicken, eggs, vegetables, desserts Onion Powder-Meat, fish, poultry, vegetables, cheese, eggs, bread, rice salads (Do not use   Onion salt) Orange Extract-Desserts, baked goods Oregano-Pasta, eggs, cheese, onions, pork, lamb, fish, chicken, vegetables, green salads Paprika-Meat, fish, poultry, eggs, cheese, vegetables Parsley Flakes-Butter, vegetables, meat fish, poultry, eggs, bread, salads (certain forms may   Contain sodium Pepper-Meat fish, poultry, vegetables, eggs Peppermint Extract-Desserts, baked goods Poppy Seed-Eggs, bread, cheese, fruit dressings, baked goods, noodles, vegetables, cottage  [employer], poultry, meat, fish, cauliflower, turnips,eggs bread Saffron-Rice, bread, veal, chicken, fish, eggs Sage-Meat, fish, poultry, onions, eggplant, tomateos, pork, stews Savory-Eggs, salads, poultry, meat, rice, vegetables, soups, pork Tarragon-Meat, poultry, fish, eggs, butter, vegetables (licorice-like flavor)  Thyme-Meat, poultry, fish, eggs, vegetables, (clover-like flavor), sauces, soups Tumeric-Salads, butter, eggs, fish, rice, vegetables (saffron-like flavor) Vanilla Extract-Baked goods, candy Vinegar-Salads, vegetables, meat marinades Walnut Extract-baked goods, candy   2. Choose your Foods Wisely   The following is a list of foods to avoid which are high in sodium:  Meats-Avoid all smoked, canned, salt cured, dried and kosher meat and fish as well as Anchovies   [employer] meats:Bologna, Liverwurst, Pastrami Canned meat or fish  Marinated herring Caviar    Pepperoni Corned Beef   Pizza Dried chipped  beef  Salami Frozen breaded fish or meat Salt pork Frankfurters or hot dogs  Sardines Gefilte fish   Sausage Ham (boiled ham, Proscuitto Smoked butt    spiced ham)   Spam      TV Dinners Vegetables Canned vegetables (Regular) Relish Canned mushrooms  Sauerkraut Olives    Tomato juice Pickles  Bakery and Dessert Products Canned puddings  Cream pies Cheesecake   Decorated cakes Cookies  Beverages/Juices Tomato juice, regular  Gatorade   V-8 vegetable juice, regular  Breads and Cereals Biscuit mixes   Salted potato chips, corn chips, pretzels Bread stuffing mixes  Salted crackers and rolls Pancake and waffle mixes Self-rising flour  Seasonings Accent    Meat sauces Barbecue sauce  Meat tenderizer Catsup    Monosodium glutamate (MSG) Celery salt   Onion salt Chili sauce   Prepared mustard Garlic salt   Salt, seasoned salt, sea salt Gravy mixes   Soy sauce Horseradish   Steak sauce Ketchup   Tartar sauce Lite salt    Teriyaki sauce Marinade mixes   Worcestershire sauce  Others Baking powder   Cocoa and cocoa mixes Baking soda   Commercial casserole mixes Candy-caramels, chocolate  Dehydrated soups    Bars, fudge,nougats  Instant rice and pasta mixes Canned broth or soup  Maraschino cherries Cheese, aged and processed cheese and cheese spreads  Learning Assessment Quiz  Indicated T (for True) or F (for False) for each of the following statements:  _____ Fresh fruits and vegetables and unprocessed grains are generally low in sodium _____ Water may contain a considerable amount of sodium, depending on the source _____ You can always tell if a food is high in sodium by tasting it _____ Certain laxatives my be high in sodium and should be avoided unless prescribed   by a physician or pharmacist _____ Salt substitutes may be used freely by anyone on a sodium restricted diet _____  Sodium is present in table salt, food additives and as a natural component of   most  foods _____ Table salt is approximately 90% sodium _____ Limiting sodium intake may help prevent excess fluid accumulation in the body _____ On a sodium-restricted diet, seasonings such as bouillon soy sauce, and    cooking wine should be used in place of table salt _____ On an ingredient list, a product which lists monosodium glutamate as the first   ingredient is an appropriate food to include on a low sodium diet  Circle the best answer(s) to the following statements (Hint: there may be more than one correct answer)  11. On a low-sodium diet, some acceptable snack items are:    A. Olives  F. Bean dip   K. Grapefruit juice    B. Salted Pretzels G. Commercial Popcorn   L. Canned peaches    C. Carrot Sticks  H. Bouillon   M. Unsalted nuts   D. [place] fries  I. Peanut butter crackers N. Salami   E. Sweet pickles J. Tomato Juice   O. Pizza  12.  Seasonings that may be used freely on a reduced - sodium diet include   A. Lemon wedges F.Monosodium glutamate K. Celery seed    B.Soysauce   G. Pepper   L. Mustard powder   C. Sea salt  H. Cooking wine  M. Onion flakes   D. Vinegar  E. Prepared horseradish N. Salsa   E. Sage   J. Worcestershire sauce  O. Chutney

## 2021-01-05 ENCOUNTER — Other Ambulatory Visit: Payer: Self-pay

## 2021-01-05 ENCOUNTER — Encounter: Payer: Self-pay | Admitting: Dermatology

## 2021-01-05 ENCOUNTER — Ambulatory Visit (INDEPENDENT_AMBULATORY_CARE_PROVIDER_SITE_OTHER): Payer: Medicare Other | Admitting: Dermatology

## 2021-01-05 DIAGNOSIS — L570 Actinic keratosis: Secondary | ICD-10-CM

## 2021-01-05 DIAGNOSIS — Z1283 Encounter for screening for malignant neoplasm of skin: Secondary | ICD-10-CM

## 2021-01-05 DIAGNOSIS — I2510 Atherosclerotic heart disease of native coronary artery without angina pectoris: Secondary | ICD-10-CM | POA: Diagnosis not present

## 2021-01-05 DIAGNOSIS — L700 Acne vulgaris: Secondary | ICD-10-CM | POA: Diagnosis not present

## 2021-01-05 DIAGNOSIS — L821 Other seborrheic keratosis: Secondary | ICD-10-CM | POA: Diagnosis not present

## 2021-01-15 LAB — CUP PACEART REMOTE DEVICE CHECK
Date Time Interrogation Session: 20221107183643
Implantable Pulse Generator Implant Date: 20211109

## 2021-01-19 ENCOUNTER — Ambulatory Visit (INDEPENDENT_AMBULATORY_CARE_PROVIDER_SITE_OTHER): Payer: Medicare Other

## 2021-01-19 DIAGNOSIS — I4819 Other persistent atrial fibrillation: Secondary | ICD-10-CM

## 2021-01-21 ENCOUNTER — Other Ambulatory Visit: Payer: Self-pay

## 2021-01-21 ENCOUNTER — Encounter: Payer: Self-pay | Admitting: Dermatology

## 2021-01-21 MED ORDER — MAGNESIUM OXIDE 400 MG PO TABS: 400.0000 mg | ORAL_TABLET | Freq: Every day | ORAL | 3 refills | Status: DC

## 2021-01-21 NOTE — Progress Notes (Signed)
   Follow-Up Visit   Subjective  Sheila Gardner is a 85 y.o. female who presents for the following: Annual Exam (Pink scale on the nose ).  General skin check, crust on nose Location:  Duration:  Quality:  Associated Signs/Symptoms: Modifying Factors:  Severity:  Timing: Context:   Objective  Well appearing patient in no apparent distress; mood and affect are within normal limits. Left Upper Back Open cyst, noninflamed  Mid Back Full body skin exam: No atypical pigmented lesions or nonmelanoma skin cancer  Chest - Medial (Center), Left Dorsal Hand, Right Dorsal Hand, Right Lower Leg - Anterior, Right Temple, Right Upper Arm - Posterior Several dozen 3 to 10 mm flattopped textured brown papules  Mid Tip of Nose Gritty 4 mm flat crust    A full examination was performed including scalp, head, eyes, ears, nose, lips, neck, chest, axillae, abdomen, back, buttocks, bilateral upper extremities, bilateral lower extremities, hands, feet, fingers, toes, fingernails, and toenails. All findings within normal limits unless otherwise noted below.  Areas beneath undergarments not fully examined   Assessment & Plan    Open comedone Left Upper Back  No intervention necessary  Screening for malignant neoplasm of skin Mid Back  Yearly skin exam.  Seborrheic keratosis (6) Right Upper Arm - Posterior; Right Lower Leg - Anterior; Chest - Medial [facility]); Left Dorsal Hand; Right Dorsal Hand; Right Temple  Leave if stable  Destruction of lesion - Left Dorsal Hand, Right Dorsal Hand, Right Temple Complexity: simple   Destruction method: cryotherapy   Informed consent: discussed and consent obtained   Timeout:  patient name, date of birth, surgical site, and procedure verified Lesion destroyed using liquid nitrogen: Yes   Cryotherapy cycles:  1 Outcome: patient tolerated procedure well with no complications   Post-procedure details: wound care instructions given    AK (actinic  keratosis) Mid Tip of Nose  Destruction of lesion - Mid Tip of Nose Complexity: simple   Destruction method: cryotherapy   Informed consent: discussed and consent obtained   Timeout:  patient name, date of birth, surgical site, and procedure verified Lesion destroyed using liquid nitrogen: Yes   Cryotherapy cycles:  3 Outcome: patient tolerated procedure well with no complications   Post-procedure details: wound care instructions given        I, Sheila Monarch, MD, have reviewed all documentation for this visit.  The documentation on 01/21/21 for the exam, diagnosis, procedures, and orders are all accurate and complete.

## 2021-01-26 NOTE — Progress Notes (Signed)
Carelink Summary Report / Loop Recorder 

## 2021-02-01 NOTE — Progress Notes (Addendum)
Cardiology Office Note:    Date:  02/03/2021   ID:  Sheila Gardner, DOB September 03, 1935, MRN [id number]  PCP:  Lawerance Cruel, MD  Cardiologist:  Sinclair Grooms, MD   Referring MD: Lawerance Cruel, MD   Chief Complaint  Patient presents with   Coronary Artery Disease    Recent non-ST elevation MI   Atrial Fibrillation     History of Present Illness:    Sheila Gardner is a 85 y.o. female with a hx of persistent atrial fibrillation with rhythm control on Tikosyn, anticoagulation, heart murmur, hypertension, h/o syncope, and osteoarthritis.    She was admitted to the hospital with chest discomfort and had low flat troponin elevation in the 300 range.  Angiography did not demonstrate obstructive disease.  LAD stents were patent.  She did not have wall motion abnormality that suggested Takotsubo.  No clinical evidence of pericarditis/myocarditis.  Echo did demonstrate mild aortic valve calcification.  She does have a systolic murmur.  Well since discharge from the hospital.  She does not believe she has had any episode of atrial fibrillation.  Past Medical History:  Diagnosis Date   Arthritis    Atrial fibrillation (HCC)    Breast cancer ([place])    Cancer ([place])    Breast- rt   Cough with sputum 2016   Elevated liver function tests 05/09/2013   GERD (gastroesophageal reflux disease)    COSTOCHONDRITIS   Heart murmur    Hip pain 05/08/2014   Hyperlipidemia    Hypertension    Lipoma    back of neck   PONV (postoperative nausea and vomiting)    only after rt hip surgery   Primary osteoarthritis of left knee 11/04/2015   Primary osteoarthritis of right hip 07/30/2014   Squamous cell carcinoma of skin 08/21/2019   atypical squa. proliferation-Left corner of mouth    Past Surgical History:  Procedure Laterality Date   ANKLE FRACTURE SURGERY Right 2013   RIGHT    BREAST LUMPECTOMY Right 2011   BREAST SURGERY  04/22/2009   Rt lumpectomy   CARDIOVERSION N/A 02/19/2019    Procedure: CARDIOVERSION;  Surgeon: Dorothy Spark, MD;  Location: [facility];  Service: Cardiovascular;  Laterality: N/A;   CORONARY ATHERECTOMY N/A 04/30/2019   Procedure: CORONARY ATHERECTOMY;  Surgeon: Belva Crome, MD;  Location: [place] [facility];  Service: Cardiovascular;  Laterality: N/A;  LAD   CORONARY STENT INTERVENTION N/A 04/30/2019   Procedure: CORONARY STENT INTERVENTION;  Surgeon: Belva Crome, MD;  Location: [place] [facility];  Service: Cardiovascular;  Laterality: N/A;  DES TO PROX-MID LAD   EYE SURGERY  2001   macular hole   JOINT REPLACEMENT  2016   KNEE SURGERY     LEFT HEART CATH AND CORONARY ANGIOGRAPHY N/A 04/30/2019   Procedure: LEFT HEART CATH AND CORONARY ANGIOGRAPHY;  Surgeon: Belva Crome, MD;  Location: [place] [facility];  Service: Cardiovascular;  Laterality: N/A;   LEFT HEART CATH AND CORONARY ANGIOGRAPHY N/A 12/08/2020   Procedure: LEFT HEART CATH AND CORONARY ANGIOGRAPHY;  Surgeon: Troy Sine, MD;  Location: [place] [facility];  Service: Cardiovascular;  Laterality: N/A;   MIDDLE EAR SURGERY Left 08/18/2015   repair eardrum and reconstruction   OVARY SURGERY  40 years ago   wedge   TOTAL HIP ARTHROPLASTY Right 07/30/2014   Procedure: TOTAL HIP ARTHROPLASTY ANTERIOR APPROACH;  Surgeon: Melrose Nakayama, MD;  Location: [facility];  Service: Orthopedics;  Laterality:  Right;   TOTAL KNEE ARTHROPLASTY Left 11/04/2015   TOTAL KNEE ARTHROPLASTY Left 11/04/2015   Procedure: TOTAL KNEE ARTHROPLASTY;  Surgeon: Melrose Nakayama, MD;  Location: [place];  Service: Orthopedics;  Laterality: Left;   TYMPANOPLASTY  30 years ago    Current Medications: Current Meds  Medication Sig   acetaminophen (TYLENOL) 650 MG CR tablet Take 1,300 mg by mouth every 8 (eight) hours as needed for pain.   Ascorbic Acid (VITAMIN C) 1000 MG tablet Take 1,000 mg by mouth daily.    BIOTIN PO Take 10,000 Units by mouth daily with breakfast.    Cholecalciferol (VITAMIN D-3) 1000 UNITS  CAPS Take 1,000 Units by mouth daily with breakfast.    cyanocobalamin 1000 MCG tablet Take 1,000 mcg by mouth daily.    dofetilide (TIKOSYN) 125 MCG capsule TAKE ONE CAPSULE BY MOUTH TWICE A DAY (Patient taking differently: Take 125 mcg by mouth 2 (two) times daily.)   ELIQUIS 5 MG TABS tablet Take 1 tablet (5 mg total) by mouth 2 (two) times daily.   ferrous sulfate 325 (65 FE) MG tablet Take 325 mg by mouth daily with breakfast.   fexofenadine (ALLEGRA) 180 MG tablet Take 1 tablet by mouth as needed.   furosemide (LASIX) 40 MG tablet Take 1 tablet (40 mg total) by mouth daily. TAKE 1 TABLET(40 MG) BY MOUTH DAILY Strength: 40 mg   gabapentin (NEURONTIN) 300 MG capsule Take 300 mg by mouth 2 (two) times daily.   hydrALAZINE (APRESOLINE) 25 MG tablet Take 1 tablet (25 mg total) by mouth in the morning and at bedtime.   iron polysaccharides (NIFEREX) 150 MG capsule Take 1 capsule by mouth 2 (two) times daily.   LORazepam (ATIVAN) 1 MG tablet Take 1 mg by mouth at bedtime.   losartan (COZAAR) 100 MG tablet Take 100 mg by mouth daily.    magnesium oxide (MAG-OX) 400 MG tablet Take 1 tablet (400 mg total) by mouth daily.   meclizine (ANTIVERT) 12.5 MG tablet Take 1 tablet (12.5 mg total) by mouth daily as needed for dizziness.   Melatonin 10 MG TABS Take 10 mg by mouth See admin instructions.   omeprazole (PRILOSEC) 20 MG capsule Take 20 mg by mouth daily.   OVER THE COUNTER MEDICATION Take 6 capsules by mouth daily. Balance of nature 3 capsules of fruit and 3 capsules of vegetables   potassium chloride (KLOR-CON) 10 MEQ tablet Take 4 tablets (40 mEq total) by mouth daily.   pyridOXINE (VITAMIN B-6) 100 MG tablet Take 100 mg by mouth daily.   rosuvastatin (CRESTOR) 40 MG tablet Take 1 tablet (40 mg total) by mouth daily at 6 PM.   vitamin E 400 UNIT capsule Take 400 Units by mouth daily.     Allergies:   Fenofibrate and Ondansetron   Social History   Socioeconomic History   Marital status:  Widowed    Spouse name: Not on file   Number of children: Not on file   Years of education: Not on file   Highest education level: Not on file  Occupational History   Not on file  Tobacco Use   Smoking status: Never   Smokeless tobacco: Never  Vaping Use   Vaping Use: Never used  Substance and Sexual Activity   Alcohol use: Not Currently   Drug use: No   Sexual activity: Not Currently    Birth control/protection: None  Other Topics Concern   Not on file  Social History Narrative   Not  on file   Social Determinants of Health   Financial Resource Strain: Not on file  Food Insecurity: Not on file  Transportation Needs: Not on file  Physical Activity: Not on file  Stress: Not on file  Social Connections: Not on file     Family History: The patient's family history includes Cancer in her brother; Heart disease in her mother.  ROS:   Please see the history of present illness.    Some anxiety concerning her overall condition.  Her son is with her who has multiple questions today.  All other systems reviewed and are negative.  EKGs/Labs/Other Studies Reviewed:    The following studies were reviewed today:  2D Doppler echocardiogram December 30, 2020 IMPRESSIONS     1. Left ventricular ejection fraction, by estimation, is 65 to 70%. The  left ventricle has normal function. The left ventricle has no regional  wall motion abnormalities. Left ventricular diastolic parameters are  indeterminate.   2. Right ventricular systolic function is normal. The right ventricular  size is normal. There is normal pulmonary artery systolic pressure.   3. No evidence of mitral valve regurgitation. Moderate mitral annular  calcification.   4. AV with mild calcification/sclerosis. . The aortic valve is normal in  structure. Aortic valve regurgitation is not visualized. No aortic  stenosis is present.   5. The inferior vena cava is normal in size with greater than 50%  respiratory  variability, suggesting right atrial pressure of 3 mmHg.   Conclusion(s)/Recommendation(s): Otherwise normal echocardiogram, with  minor abnormalities described in the report. No significant changes from  echo 12/07/2020.    CARDIAC CATH 12/08/2020: Widely patent ostial to proximal LAD stent at site of prior atherectomy/DES stenting with 40 to 50% LAD stenoses beyond the stented segment and 30% mid and mid distal stenosis; large codominant left circumflex vessel with 50% stenosis after the OM 2 vessel in the AV groove circumflex and normal RCA.   Normal LV function without focal segmental wall motion abnormalities with EF estimated 55 to 60%.  LVEDP 14 mmHg   RECOMMENDATION: Eliquis can be resumed in a.m.  Medical therapy for concomitant CAD.  Rest of lipid-lowering therapy with target LDL less than 70.   January 2021 continuous monitor: Study Highlights  Basic rhythm is sinus rhythm with occasional profound sinus bradycardia during sleep, less than 35 bpm. Occasional PACs and PVCs are noted. Nonsustained SVT less than 10 beats, is very rare. No ventricular or atrial sustained dysrhythmia.  EKG:  EKG performed 12/16/2020 demonstrates sinus bradycardia, right bundle, left anterior hemiblock.  Recent Labs: 12/09/2020: BUN 11; Creatinine, Ser 0.63; Magnesium 2.2; Potassium 3.9; Sodium 133 12/10/2020: Hemoglobin 11.0; Platelets 231  Recent Lipid Panel    Component Value Date/Time   CHOL 124 12/08/2020 0307   TRIG 115 12/08/2020 0307   HDL 57 12/08/2020 0307   CHOLHDL 2.2 12/08/2020 0307   VLDL 23 12/08/2020 0307   LDLCALC 44 12/08/2020 0307    Physical Exam:    VS:  BP (!) 124/56   Pulse (!) 47   Ht 5' 3.75" (1.619 m)   Wt 180 lb 12.8 oz (82 kg)   SpO2 97%   BMI 31.28 kg/m     Wt Readings from Last 3 Encounters:  02/03/21 180 lb 12.8 oz (82 kg)  12/31/20 187 lb 3.2 oz (84.9 kg)  12/16/20 186 lb (84.4 kg)     GEN: Healthy appearing. No acute distress HEENT:  Normal NECK: No JVD. LYMPHATICS: No lymphadenopathy  CARDIAC: No murmur. RRR no gallop, or edema. VASCULAR:  Normal Pulses. No bruits. RESPIRATORY:  Clear to auscultation without rales, wheezing or rhonchi  ABDOMEN: Soft, non-tender, non-distended, No pulsatile mass, MUSCULOSKELETAL: No deformity  SKIN: Warm and dry NEUROLOGIC:  Alert and oriented x 3 PSYCHIATRIC:  Normal affect   ASSESSMENT:    1. Coronary artery disease involving native coronary artery of native heart without angina pectoris   2. Persistent atrial fibrillation ([place])   3. Primary hypertension   4. Sinus node dysfunction (HCC)   5. Chronic anticoagulation   6. Bradycardia   7. Dyspnea on exertion    PLAN:    In order of problems listed above:  Continue secondary prevention with recent cath demonstrated patent LAD stents. Maintaining sinus rhythm on Tikosyn.  She frequently requires heart rates less than 45 during daytime and states that she feels poorly.  We will do a 48-hour monitor to determine heart rate response activity and baseline establishment compared to 12 months ago.  Continue Tikosyn. Continue current therapy.  Blood pressure is controlled. Monitor as noted above. Continue Eliquis. Monitor is noted. Recent dyspnea on exertion possibly related to diastolic heart failure although echocardiogram done recently did not allow determination of diastolic parameters.  If she has chronotropic incompetence, pacemaker therapy may have exertional dyspnea.  We will consider adding SGLT2 therapy if dyspnea continues and pacemaker therapy is not decided upon.   Overall education and awareness concerning secondary risk prevention was discussed in detail: LDL less than 70, hemoglobin A1c less than 7, blood pressure target less than 130/80 mmHg, >150 minutes of moderate aerobic activity per week, avoidance of smoking, weight control (via diet and exercise), and continued surveillance/management of/for obstructive sleep  apnea.   Medication Adjustments/Labs and Tests Ordered: Current medicines are reviewed at length with the patient today.  Concerns regarding medicines are outlined above.  Orders Placed This Encounter  Procedures   LONG TERM MONITOR (3-14 DAYS)   No orders of the defined types were placed in this encounter.   Patient Instructions  Medication Instructions:  Your physician recommends that you continue on your current medications as directed. Please refer to the Current Medication list given to you today.  *If you need a refill on your cardiac medications before your next appointment, please call your pharmacy*   Lab Work: None If you have labs (blood work) drawn today and your tests are completely normal, you will receive your results only by: [place] (if you have MyChart) OR A paper copy in the mail If you have any lab test that is abnormal or we need to change your treatment, we will call you to review the results.   Testing/Procedures: Your physician recommends that you wear a monitor for 3 days.   Follow-Up: At [facility], you and your health needs are our priority.  As part of our continuing mission to provide you with exceptional heart care, we have created designated Provider Care Teams.  These Care Teams include your primary Cardiologist (physician) and Advanced Practice Providers (APPs -  Physician Assistants and Nurse Practitioners) who all work together to provide you with the care you need, when you need it.  We recommend signing up for the patient portal called "MyChart".  Sign up information is provided on this After Visit Summary.  MyChart is used to connect with patients for Virtual Visits (Telemedicine).  Patients are able to view lab/test results, encounter notes, upcoming appointments, etc.  Non-urgent messages can be sent to  your provider as well.   To learn more about what you can do with MyChart, go to [email or web address].    Your next  appointment:   6 month(s)  The format for your next appointment:   In Person  Provider:   Sinclair Grooms, MD     Other Instructions  [place] Monitor Instructions  Your physician has requested you wear a ZIO patch monitor for 3 days.  This is a single patch monitor. [employer] supplies one patch monitor per enrollment. Additional stickers are not available. Please do not apply patch if you will be having a Nuclear Stress Test,  Echocardiogram, Cardiac CT, MRI, or Chest Xray during the period you would be wearing the  monitor. The patch cannot be worn during these tests. You cannot remove and re-apply the  ZIO XT patch monitor.  Your ZIO patch monitor will be mailed 3 day USPS to your address on file. It may take 3-5 days  to receive your monitor after you have been enrolled.  Once you have received your monitor, please review the enclosed instructions. Your monitor  has already been registered assigning a specific monitor serial # to you.  Billing and Patient Assistance Program Information  We have supplied [employer] with any of your insurance information on file for billing purposes. [employer] offers a sliding scale Patient Assistance Program for patients that do not have  insurance, or whose insurance does not completely cover the cost of the ZIO monitor.  You must apply for the Patient Assistance Program to qualify for this discounted rate.  To apply, please call [employer] at [phone], select option 4, select option 2, ask to apply for  Patient Assistance Program. Theodore Demark will ask your household income, and how many people  are in your household. They will quote your out-of-pocket cost based on that information.  [employer] will also be able to set up a 51-month, interest-free payment plan if needed.  Applying the monitor   Shave hair from upper left chest.  Hold abrader disc by orange tab. Rub abrader in 40 strokes over the upper left chest as  indicated in your  monitor instructions.  Clean area with 4 enclosed alcohol pads. Let dry.  Apply patch as indicated in monitor instructions. Patch will be placed under collarbone on left  side of chest with arrow pointing upward.  Rub patch adhesive wings for 2 minutes. Remove white label marked "1". Remove the white  label marked "2". Rub patch adhesive wings for 2 additional minutes.  While looking in a mirror, press and release button in center of patch. A small green light will  flash 3-4 times. This will be your only indicator that the monitor has been turned on.  Do not shower for the first 24 hours. You may shower after the first 24 hours.  Press the button if you feel a symptom. You will hear a small click. Record Date, Time and  Symptom in the Patient Logbook.  When you are ready to remove the patch, follow instructions on the last 2 pages of Patient  Logbook. Stick patch monitor onto the last page of Patient Logbook.  Place Patient Logbook in the blue and white box. Use locking tab on box and tape box closed  securely. The blue and white box has prepaid postage on it. Please place it in the mailbox as  soon as possible. Your physician should have your test results approximately 7 days after the  monitor has been  mailed back to [facility].  Call [place] at [phone] if you have questions regarding  your ZIO XT patch monitor. Call them immediately if you see an orange light blinking on your  monitor.  If your monitor falls off in less than 4 days, contact our Monitor department at [phone].  If your monitor becomes loose or falls off after 4 days call [employer] at [phone] for  suggestions on securing your monitor     Signed, Sinclair Grooms, MD  02/03/2021 5:16 PM    [place]

## 2021-02-03 ENCOUNTER — Other Ambulatory Visit: Payer: Self-pay | Admitting: *Deleted

## 2021-02-03 ENCOUNTER — Ambulatory Visit (INDEPENDENT_AMBULATORY_CARE_PROVIDER_SITE_OTHER): Payer: Medicare Other | Admitting: Interventional Cardiology

## 2021-02-03 ENCOUNTER — Other Ambulatory Visit: Payer: Self-pay

## 2021-02-03 ENCOUNTER — Encounter: Payer: Self-pay | Admitting: Interventional Cardiology

## 2021-02-03 ENCOUNTER — Ambulatory Visit (INDEPENDENT_AMBULATORY_CARE_PROVIDER_SITE_OTHER): Payer: Medicare Other

## 2021-02-03 VITALS — BP 124/56 | HR 47 | Ht 63.75 in | Wt 180.8 lb

## 2021-02-03 DIAGNOSIS — I495 Sick sinus syndrome: Secondary | ICD-10-CM

## 2021-02-03 DIAGNOSIS — I2510 Atherosclerotic heart disease of native coronary artery without angina pectoris: Secondary | ICD-10-CM

## 2021-02-03 DIAGNOSIS — R0609 Other forms of dyspnea: Secondary | ICD-10-CM

## 2021-02-03 DIAGNOSIS — Z7901 Long term (current) use of anticoagulants: Secondary | ICD-10-CM | POA: Diagnosis not present

## 2021-02-03 DIAGNOSIS — R001 Bradycardia, unspecified: Secondary | ICD-10-CM

## 2021-02-03 DIAGNOSIS — I4819 Other persistent atrial fibrillation: Secondary | ICD-10-CM

## 2021-02-03 DIAGNOSIS — I10 Essential (primary) hypertension: Secondary | ICD-10-CM

## 2021-02-03 MED ORDER — FUROSEMIDE 40 MG PO TABS: 40.0000 mg | ORAL_TABLET | Freq: Every day | ORAL | 1 refills | Status: DC

## 2021-02-03 NOTE — Progress Notes (Unsigned)
Enrolled patient for a 3 day Zio XT monitor to be mailed to patients home  

## 2021-02-03 NOTE — Patient Instructions (Signed)
Medication Instructions:  Your physician recommends that you continue on your current medications as directed. Please refer to the Current Medication list given to you today.  *If you need a refill on your cardiac medications before your next appointment, please call your pharmacy*   Lab Work: None If you have labs (blood work) drawn today and your tests are completely normal, you will receive your results only by: [place] (if you have MyChart) OR A paper copy in the mail If you have any lab test that is abnormal or we need to change your treatment, we will call you to review the results.   Testing/Procedures: Your physician recommends that you wear a monitor for 3 days.   Follow-Up: At [facility], you and your health needs are our priority.  As part of our continuing mission to provide you with exceptional heart care, we have created designated Provider Care Teams.  These Care Teams include your primary Cardiologist (physician) and Advanced Practice Providers (APPs -  Physician Assistants and Nurse Practitioners) who all work together to provide you with the care you need, when you need it.  We recommend signing up for the patient portal called "MyChart".  Sign up information is provided on this After Visit Summary.  MyChart is used to connect with patients for Virtual Visits (Telemedicine).  Patients are able to view lab/test results, encounter notes, upcoming appointments, etc.  Non-urgent messages can be sent to your provider as well.   To learn more about what you can do with MyChart, go to [email or web address].    Your next appointment:   6 month(s)  The format for your next appointment:   In Person  Provider:   Sinclair Grooms, MD     Other Instructions  [place] Monitor Instructions  Your physician has requested you wear a ZIO patch monitor for 3 days.  This is a single patch monitor. [employer] supplies one patch monitor per enrollment.  Additional stickers are not available. Please do not apply patch if you will be having a Nuclear Stress Test,  Echocardiogram, Cardiac CT, MRI, or Chest Xray during the period you would be wearing the  monitor. The patch cannot be worn during these tests. You cannot remove and re-apply the  ZIO XT patch monitor.  Your ZIO patch monitor will be mailed 3 day USPS to your address on file. It may take 3-5 days  to receive your monitor after you have been enrolled.  Once you have received your monitor, please review the enclosed instructions. Your monitor  has already been registered assigning a specific monitor serial # to you.  Billing and Patient Assistance Program Information  We have supplied [employer] with any of your insurance information on file for billing purposes. [employer] offers a sliding scale Patient Assistance Program for patients that do not have  insurance, or whose insurance does not completely cover the cost of the ZIO monitor.  You must apply for the Patient Assistance Program to qualify for this discounted rate.  To apply, please call [employer] at [phone], select option 4, select option 2, ask to apply for  Patient Assistance Program. Theodore Demark will ask your household income, and how many people  are in your household. They will quote your out-of-pocket cost based on that information.  [employer] will also be able to set up a 72-month, interest-free payment plan if needed.  Applying the monitor   Shave hair from upper left chest.  Hold abrader disc by orange  tab. Rub abrader in 40 strokes over the upper left chest as  indicated in your monitor instructions.  Clean area with 4 enclosed alcohol pads. Let dry.  Apply patch as indicated in monitor instructions. Patch will be placed under collarbone on left  side of chest with arrow pointing upward.  Rub patch adhesive wings for 2 minutes. Remove white label marked "1". Remove the white  label marked "2". Rub patch adhesive wings  for 2 additional minutes.  While looking in a mirror, press and release button in center of patch. A small green light will  flash 3-4 times. This will be your only indicator that the monitor has been turned on.  Do not shower for the first 24 hours. You may shower after the first 24 hours.  Press the button if you feel a symptom. You will hear a small click. Record Date, Time and  Symptom in the Patient Logbook.  When you are ready to remove the patch, follow instructions on the last 2 pages of Patient  Logbook. Stick patch monitor onto the last page of Patient Logbook.  Place Patient Logbook in the blue and white box. Use locking tab on box and tape box closed  securely. The blue and white box has prepaid postage on it. Please place it in the mailbox as  soon as possible. Your physician should have your test results approximately 7 days after the  monitor has been mailed back to [facility].  Call [place] at [phone] if you have questions regarding  your ZIO XT patch monitor. Call them immediately if you see an orange light blinking on your  monitor.  If your monitor falls off in less than 4 days, contact our Monitor department at [phone].  If your monitor becomes loose or falls off after 4 days call [employer] at [phone] for  suggestions on securing your monitor

## 2021-02-07 DIAGNOSIS — R001 Bradycardia, unspecified: Secondary | ICD-10-CM

## 2021-02-12 DIAGNOSIS — Z20822 Contact with and (suspected) exposure to covid-19: Secondary | ICD-10-CM | POA: Diagnosis not present

## 2021-02-16 DIAGNOSIS — R001 Bradycardia, unspecified: Secondary | ICD-10-CM | POA: Diagnosis not present

## 2021-02-18 ENCOUNTER — Telehealth: Payer: Self-pay | Admitting: Interventional Cardiology

## 2021-02-18 NOTE — Telephone Encounter (Signed)
Spoke with pt and reviewed monitor information.  Advised I will be in touch once Dr. [name] reviews and sends me his recommendations.

## 2021-02-18 NOTE — Telephone Encounter (Signed)
Patient received her heart monitor results and she would like to go over the info with the nurse/dr. Please advise

## 2021-02-23 ENCOUNTER — Ambulatory Visit (INDEPENDENT_AMBULATORY_CARE_PROVIDER_SITE_OTHER): Payer: Medicare Other

## 2021-02-23 DIAGNOSIS — R55 Syncope and collapse: Secondary | ICD-10-CM

## 2021-02-23 DIAGNOSIS — I4819 Other persistent atrial fibrillation: Secondary | ICD-10-CM

## 2021-02-24 LAB — CUP PACEART REMOTE DEVICE CHECK
Date Time Interrogation Session: 20221217230905
Implantable Pulse Generator Implant Date: 20211109

## 2021-02-25 ENCOUNTER — Telehealth: Payer: Self-pay | Admitting: Interventional Cardiology

## 2021-02-25 NOTE — Telephone Encounter (Signed)
Sheila Crome, MD  02/20/2021  1:11 PM EST     Let the patient know the Ave HR is slow. There is poor HR increase with activity. Has chronotropic incompetence. Needs to e seen by EP. ? Pacemaker for chronotropic incompetence.  The patient has been notified of the result and verbalized understanding.  All questions (if any) were answered. Antonieta Iba, RN 02/25/2021 1:17 PM  Patient is followed by Dr. Lovena Le.  Will send a message to scheduler to set up appointment.

## 2021-02-25 NOTE — Telephone Encounter (Signed)
Pragya is returning Jennifer's call from yesterday in regards to her heart monitor results.

## 2021-02-26 ENCOUNTER — Telehealth: Payer: Self-pay | Admitting: Interventional Cardiology

## 2021-02-26 ENCOUNTER — Other Ambulatory Visit: Payer: Self-pay

## 2021-02-26 MED ORDER — ELIQUIS 5 MG PO TABS: 5.0000 mg | ORAL_TABLET | Freq: Two times a day (BID) | ORAL | 5 refills | Status: DC

## 2021-02-26 NOTE — Telephone Encounter (Signed)
Prescription refill request for Eliquis received. Indication: Afib  Last office visit:02/03/21 Sheila Gardner)  Scr: 0.63 (12/09/20)  Age: 85 Weight: 82kg  Appropriate dose and refill sent to requested pharmacy.

## 2021-02-26 NOTE — Telephone Encounter (Signed)
Patient calling the office for samples of medication:   1.  What medication and dosage are you requesting samples for? ELIQUIS 5 MG TABS tablet  2.  Are you currently out of this medication? No, she has 5 more tablets, but needs enough to make it to the new year.

## 2021-03-04 NOTE — Progress Notes (Signed)
Carelink Summary Report / Loop Recorder 

## 2021-03-11 ENCOUNTER — Telehealth: Payer: Self-pay | Admitting: Interventional Cardiology

## 2021-03-11 NOTE — Telephone Encounter (Signed)
Spoke with pt and made her aware that she does not have CHF.  Pt appreciative for call.

## 2021-03-11 NOTE — Telephone Encounter (Signed)
Patient called wanting to know if she would be consider a CHF patient.  She needs to know for insurance reasons.

## 2021-03-24 DIAGNOSIS — R109 Unspecified abdominal pain: Secondary | ICD-10-CM | POA: Diagnosis not present

## 2021-03-24 DIAGNOSIS — R350 Frequency of micturition: Secondary | ICD-10-CM | POA: Diagnosis not present

## 2021-03-24 DIAGNOSIS — R7303 Prediabetes: Secondary | ICD-10-CM | POA: Diagnosis not present

## 2021-03-24 DIAGNOSIS — R300 Dysuria: Secondary | ICD-10-CM | POA: Diagnosis not present

## 2021-03-26 ENCOUNTER — Other Ambulatory Visit: Payer: Self-pay

## 2021-03-26 ENCOUNTER — Encounter: Payer: Self-pay | Admitting: Internal Medicine

## 2021-03-26 ENCOUNTER — Ambulatory Visit: Payer: PPO | Admitting: Internal Medicine

## 2021-03-26 VITALS — BP 146/70 | HR 57 | Ht 64.5 in | Wt 177.8 lb

## 2021-03-26 DIAGNOSIS — I495 Sick sinus syndrome: Secondary | ICD-10-CM | POA: Insufficient documentation

## 2021-03-26 DIAGNOSIS — R55 Syncope and collapse: Secondary | ICD-10-CM | POA: Diagnosis not present

## 2021-03-26 DIAGNOSIS — I4819 Other persistent atrial fibrillation: Secondary | ICD-10-CM

## 2021-03-26 DIAGNOSIS — I10 Essential (primary) hypertension: Secondary | ICD-10-CM

## 2021-03-26 DIAGNOSIS — I480 Paroxysmal atrial fibrillation: Secondary | ICD-10-CM | POA: Insufficient documentation

## 2021-03-26 NOTE — Patient Instructions (Addendum)
Medication Instructions:  Your physician recommends that you continue on your current medications as directed. Please refer to the Current Medication list given to you today.  Labwork: None ordered.  Testing/Procedures: None ordered.  Follow-Up: Your physician wants you to follow-up in: 6 months with Cristopher Peru, MD   September 04, 2021 at 1:45 pm  Any Other Special Instructions Will Be Listed Below (If Applicable).  If you need a refill on your cardiac medications before your next appointment, please call your pharmacy.

## 2021-03-26 NOTE — Progress Notes (Signed)
HPI Sheila Gardner returns today for followup. She is a pleasant 86 yo woman with a h/o PAF who has been maintained on dofetilide. She lost almost 10 lbs. She has had rare palpitations. She has class 2 dyspnea. She denies syncope.  Allergies  Allergen Reactions   Fenofibrate Other (See Comments)    elevated LFTs   Ondansetron Other (See Comments)    drug interaction     Current Outpatient Medications  Medication Sig Dispense Refill   acetaminophen (TYLENOL) 650 MG CR tablet Take 1,300 mg by mouth every 8 (eight) hours as needed for pain.     Ascorbic Acid (VITAMIN C) 1000 MG tablet Take 1,000 mg by mouth daily.      BIOTIN PO Take 10,000 Units by mouth daily with breakfast.      Cholecalciferol (VITAMIN D-3) 1000 UNITS CAPS Take 1,000 Units by mouth daily with breakfast.      cyanocobalamin 1000 MCG tablet Take 1,000 mcg by mouth daily.      dofetilide (TIKOSYN) 125 MCG capsule TAKE ONE CAPSULE BY MOUTH TWICE A DAY (Patient taking differently: Take 125 mcg by mouth 2 (two) times daily.) 60 capsule 11   ELIQUIS 5 MG TABS tablet Take 1 tablet (5 mg total) by mouth 2 (two) times daily. 60 tablet 5   ferrous sulfate 325 (65 FE) MG tablet Take 325 mg by mouth daily with breakfast.     fexofenadine (ALLEGRA) 180 MG tablet Take 1 tablet by mouth as needed.     furosemide (LASIX) 40 MG tablet Take 1 tablet (40 mg total) by mouth daily. TAKE 1 TABLET(40 MG) BY MOUTH DAILY Strength: 40 mg 90 tablet 1   gabapentin (NEURONTIN) 300 MG capsule Take 300 mg by mouth 2 (two) times daily.     hydrALAZINE (APRESOLINE) 25 MG tablet Take 1 tablet (25 mg total) by mouth in the morning and at bedtime. 180 tablet 3   iron polysaccharides (NIFEREX) 150 MG capsule Take 1 capsule by mouth 2 (two) times daily.     LORazepam (ATIVAN) 1 MG tablet Take 1 mg by mouth at bedtime.     losartan (COZAAR) 100 MG tablet Take 100 mg by mouth daily.      magnesium oxide (MAG-OX) 400 MG tablet Take 1 tablet (400 mg  total) by mouth daily. 90 tablet 3   meclizine (ANTIVERT) 12.5 MG tablet Take 1 tablet (12.5 mg total) by mouth daily as needed for dizziness. 30 tablet 0   Melatonin 10 MG TABS Take 10 mg by mouth See admin instructions.     Omega-3 Fatty Acids (OMEGA 3 500 PO) Take 1 tablet by mouth daily.     omeprazole (PRILOSEC) 20 MG capsule Take 20 mg by mouth daily.     OVER THE COUNTER MEDICATION Take 6 capsules by mouth daily. Balance of nature 3 capsules of fruit and 3 capsules of vegetables     potassium chloride (KLOR-CON) 10 MEQ tablet Take 4 tablets (40 mEq total) by mouth daily. 120 tablet 6   pyridOXINE (VITAMIN B-6) 100 MG tablet Take 100 mg by mouth daily.     rosuvastatin (CRESTOR) 40 MG tablet Take 1 tablet (40 mg total) by mouth daily at 6 PM. 30 tablet 6   vitamin E 400 UNIT capsule Take 400 Units by mouth daily.     No current facility-administered medications for this visit.     Past Medical History:  Diagnosis Date   Arthritis  Atrial fibrillation (HCC)    Breast cancer (HCC)    Cancer (HCC)    Breast- rt   Cough with sputum 2016   Elevated liver function tests 05/09/2013   GERD (gastroesophageal reflux disease)    COSTOCHONDRITIS   Heart murmur    Hip pain 05/08/2014   Hyperlipidemia    Hypertension    Lipoma    back of neck   PONV (postoperative nausea and vomiting)    only after rt hip surgery   Primary osteoarthritis of left knee 11/04/2015   Primary osteoarthritis of right hip 07/30/2014   Squamous cell carcinoma of skin 08/21/2019   atypical squa. proliferation-Left corner of mouth    ROS:   All systems reviewed and negative except as noted in the HPI.   Past Surgical History:  Procedure Laterality Date   ANKLE FRACTURE SURGERY Right 2013   RIGHT    BREAST LUMPECTOMY Right 2011   BREAST SURGERY  04/22/2009   Rt lumpectomy   CARDIOVERSION N/A 02/19/2019   Procedure: CARDIOVERSION;  Surgeon: Dorothy Spark, MD;  Location: [facility];  Service:  Cardiovascular;  Laterality: N/A;   CORONARY ATHERECTOMY N/A 04/30/2019   Procedure: CORONARY ATHERECTOMY;  Surgeon: Belva Crome, MD;  Location: [facility];  Service: Cardiovascular;  Laterality: N/A;  LAD   CORONARY STENT INTERVENTION N/A 04/30/2019   Procedure: CORONARY STENT INTERVENTION;  Surgeon: Belva Crome, MD;  Location: [facility];  Service: Cardiovascular;  Laterality: N/A;  DES TO PROX-MID LAD   EYE SURGERY  2001   macular hole   JOINT REPLACEMENT  2016   KNEE SURGERY     LEFT HEART CATH AND CORONARY ANGIOGRAPHY N/A 04/30/2019   Procedure: LEFT HEART CATH AND CORONARY ANGIOGRAPHY;  Surgeon: Belva Crome, MD;  Location: [facility];  Service: Cardiovascular;  Laterality: N/A;   LEFT HEART CATH AND CORONARY ANGIOGRAPHY N/A 12/08/2020   Procedure: LEFT HEART CATH AND CORONARY ANGIOGRAPHY;  Surgeon: Troy Sine, MD;  Location: [facility];  Service: Cardiovascular;  Laterality: N/A;   MIDDLE EAR SURGERY Left 08/18/2015   repair eardrum and reconstruction   OVARY SURGERY  40 years ago   wedge   TOTAL HIP ARTHROPLASTY Right 07/30/2014   Procedure: TOTAL HIP ARTHROPLASTY ANTERIOR APPROACH;  Surgeon: Melrose Nakayama, MD;  Location: [facility];  Service: Orthopedics;  Laterality: Right;   TOTAL KNEE ARTHROPLASTY Left 11/04/2015   TOTAL KNEE ARTHROPLASTY Left 11/04/2015   Procedure: TOTAL KNEE ARTHROPLASTY;  Surgeon: Melrose Nakayama, MD;  Location: [facility];  Service: Orthopedics;  Laterality: Left;   TYMPANOPLASTY  30 years ago     Family History  Problem Relation Age of Onset   Heart disease Mother    Cancer Brother        colon, pancreatic, brain     Social History   Socioeconomic History   Marital status: Widowed    Spouse name: Not on file   Number of children: Not on file   Years of education: Not on file   Highest education level: Not on file  Occupational History   Not on file  Tobacco Use   Smoking status: Never   Smokeless tobacco:  Never  Vaping Use   Vaping Use: Never used  Substance and Sexual Activity   Alcohol use: Not Currently   Drug use: No   Sexual activity: Not Currently    Birth control/protection: None  Other Topics Concern   Not on  file  Social History Narrative   Not on file   Social Determinants of Health   Financial Resource Strain: Not on file  Food Insecurity: Not on file  Transportation Needs: Not on file  Physical Activity: Not on file  Stress: Not on file  Social Connections: Not on file  Intimate Partner Violence: Not on file     BP (!) 146/70    Pulse (!) 57    Ht 5' 4.5" (1.638 m)    Wt 177 lb 12.8 oz (80.6 kg)    SpO2 97%    BMI 30.05 kg/m   Physical Exam:  Well appearing NAD HEENT: Unremarkable Neck:  No JVD, no thyromegally Lymphatics:  No adenopathy Back:  No CVA tenderness Lungs:  Clear with no wheezes HEART:  Regular rate rhythm, no murmurs, no rubs, no clicks Abd:  soft, positive bowel sounds, no organomegally, no rebound, no guarding Ext:  2 plus pulses, no edema, no cyanosis, no clubbing Skin:  No rashes no nodules Neuro:  CN II through XII intact, motor grossly intact  EKG - NSR with RBBB  Assess/Plan:  PAF - she is maintaining NSR. She will continue dofetilide. Sinus node dysfunction - the patient has had episodes of HR's in the low 40's. Unclear if she was awake and she is not clearly symptomatic. I discussed the symptoms that she might experience if she had worsening bradycardia. HTN - her bp is minimally elevated. We will follow. No change in her meds today. I asked her to take additional hydralazine if her bp was above 170.  Syncope - no pauses. No symptoms. We will follow.  Sheila Overlie Rekisha Welling,MD

## 2021-03-30 ENCOUNTER — Ambulatory Visit (INDEPENDENT_AMBULATORY_CARE_PROVIDER_SITE_OTHER): Payer: PPO

## 2021-03-30 DIAGNOSIS — R55 Syncope and collapse: Secondary | ICD-10-CM | POA: Diagnosis not present

## 2021-03-30 LAB — CUP PACEART REMOTE DEVICE CHECK
Date Time Interrogation Session: 20230122231156
Implantable Pulse Generator Implant Date: 20211109

## 2021-04-02 DIAGNOSIS — I10 Essential (primary) hypertension: Secondary | ICD-10-CM | POA: Diagnosis not present

## 2021-04-02 DIAGNOSIS — K219 Gastro-esophageal reflux disease without esophagitis: Secondary | ICD-10-CM | POA: Diagnosis not present

## 2021-04-02 DIAGNOSIS — E871 Hypo-osmolality and hyponatremia: Secondary | ICD-10-CM | POA: Diagnosis not present

## 2021-04-09 NOTE — Progress Notes (Signed)
Carelink Summary Report / Loop Recorder 

## 2021-04-14 ENCOUNTER — Other Ambulatory Visit: Payer: Self-pay | Admitting: Family Medicine

## 2021-04-14 DIAGNOSIS — Z1231 Encounter for screening mammogram for malignant neoplasm of breast: Secondary | ICD-10-CM

## 2021-04-20 ENCOUNTER — Telehealth: Payer: Self-pay | Admitting: Internal Medicine

## 2021-04-20 DIAGNOSIS — D1771 Benign lipomatous neoplasm of kidney: Secondary | ICD-10-CM | POA: Diagnosis not present

## 2021-04-20 DIAGNOSIS — M545 Low back pain, unspecified: Secondary | ICD-10-CM | POA: Diagnosis not present

## 2021-04-20 DIAGNOSIS — I10 Essential (primary) hypertension: Secondary | ICD-10-CM

## 2021-04-20 DIAGNOSIS — R109 Unspecified abdominal pain: Secondary | ICD-10-CM | POA: Diagnosis not present

## 2021-04-20 NOTE — Telephone Encounter (Signed)
Spoke with pt and over the 3-4 days has noted elevated  B/P pt also c/o headache and nausea Per pt checked B/P 2 hours after taking meds and was 165/70 and per pt was at urologists a little later and B/P remained 165 Per pt states was told could take a extra Hydralazine as needed for elvated B/P Per pt had to do this the other day Pt taking Losartan 100 mg every day and Hydralazine 25 mg twice daily  furosemide 40 mg  every day Will forward to Dr [name] for review and recommendations ./cy

## 2021-04-20 NOTE — Telephone Encounter (Signed)
Per pt has tried Amlodipine in the past and did not tolerate Will forward to Dr [name] .Adonis Housekeeper

## 2021-04-20 NOTE — Telephone Encounter (Signed)
° °  Pt c/o BP issue: STAT if pt c/o blurred vision, one-sided weakness or slurred speech  1. What are your last 5 BP readings?   154/79, 165/70, 165/71  2. Are you having any other symptoms (ex. Dizziness, headache, blurred vision, passed out)?   Headaches, nausea  3. What is your BP issue?  Patient states that even with medication her BP has been running high and is not sure what to do

## 2021-04-22 ENCOUNTER — Other Ambulatory Visit: Payer: Self-pay | Admitting: Urology

## 2021-04-22 DIAGNOSIS — R109 Unspecified abdominal pain: Secondary | ICD-10-CM

## 2021-04-22 DIAGNOSIS — M545 Low back pain, unspecified: Secondary | ICD-10-CM

## 2021-04-23 MED ORDER — VALSARTAN 320 MG PO TABS: 320.0000 mg | ORAL_TABLET | Freq: Every day | ORAL | 3 refills | Status: AC

## 2021-04-23 NOTE — Telephone Encounter (Signed)
Received clarification from Dr. [name] that he does want pt to take Valsartan 320mg  QD.  Prescription sent.

## 2021-04-23 NOTE — Telephone Encounter (Signed)
Called pt and reviewed recommendations.  She will come for labs on 2/24. Pt verbalized understanding and was in agreement with plan.   Message sent to Dr. [name] to clarify that he wanted Valsartan 320mg , not 300mg .

## 2021-04-27 ENCOUNTER — Ambulatory Visit
Admission: RE | Admit: 2021-04-27 | Discharge: 2021-04-27 | Disposition: A | Discharge status: Home / Self Care | Payer: PPO | Source: Ambulatory Visit | Attending: Urology | Admitting: Urology

## 2021-04-27 DIAGNOSIS — K8020 Calculus of gallbladder without cholecystitis without obstruction: Secondary | ICD-10-CM | POA: Diagnosis not present

## 2021-04-27 DIAGNOSIS — R109 Unspecified abdominal pain: Secondary | ICD-10-CM

## 2021-04-27 DIAGNOSIS — M545 Low back pain, unspecified: Secondary | ICD-10-CM

## 2021-04-27 DIAGNOSIS — D1771 Benign lipomatous neoplasm of kidney: Secondary | ICD-10-CM | POA: Diagnosis not present

## 2021-04-28 DIAGNOSIS — H353122 Nonexudative age-related macular degeneration, left eye, intermediate dry stage: Secondary | ICD-10-CM | POA: Diagnosis not present

## 2021-04-28 DIAGNOSIS — H43812 Vitreous degeneration, left eye: Secondary | ICD-10-CM | POA: Diagnosis not present

## 2021-04-28 DIAGNOSIS — H35372 Puckering of macula, left eye: Secondary | ICD-10-CM | POA: Diagnosis not present

## 2021-04-28 DIAGNOSIS — H31091 Other chorioretinal scars, right eye: Secondary | ICD-10-CM | POA: Diagnosis not present

## 2021-04-28 DIAGNOSIS — H43392 Other vitreous opacities, left eye: Secondary | ICD-10-CM | POA: Diagnosis not present

## 2021-04-28 DIAGNOSIS — D3132 Benign neoplasm of left choroid: Secondary | ICD-10-CM | POA: Diagnosis not present

## 2021-05-01 ENCOUNTER — Telehealth: Payer: Self-pay | Admitting: *Deleted

## 2021-05-01 ENCOUNTER — Other Ambulatory Visit: Payer: PPO | Admitting: *Deleted

## 2021-05-01 ENCOUNTER — Encounter (HOSPITAL_COMMUNITY): Payer: Self-pay

## 2021-05-01 ENCOUNTER — Ambulatory Visit (INDEPENDENT_AMBULATORY_CARE_PROVIDER_SITE_OTHER): Payer: PPO

## 2021-05-01 ENCOUNTER — Ambulatory Visit (HOSPITAL_COMMUNITY)
Admission: EM | Admit: 2021-05-01 | Discharge: 2021-05-01 | Disposition: A | Discharge status: Home / Self Care | Payer: PPO | Attending: Family Medicine | Admitting: Family Medicine

## 2021-05-01 ENCOUNTER — Other Ambulatory Visit: Payer: Self-pay

## 2021-05-01 DIAGNOSIS — C50919 Malignant neoplasm of unspecified site of unspecified female breast: Secondary | ICD-10-CM | POA: Diagnosis not present

## 2021-05-01 DIAGNOSIS — I10 Essential (primary) hypertension: Secondary | ICD-10-CM

## 2021-05-01 DIAGNOSIS — R0989 Other specified symptoms and signs involving the circulatory and respiratory systems: Secondary | ICD-10-CM | POA: Diagnosis not present

## 2021-05-01 DIAGNOSIS — J069 Acute upper respiratory infection, unspecified: Secondary | ICD-10-CM

## 2021-05-01 DIAGNOSIS — R059 Cough, unspecified: Secondary | ICD-10-CM | POA: Diagnosis not present

## 2021-05-01 DIAGNOSIS — R051 Acute cough: Secondary | ICD-10-CM

## 2021-05-01 DIAGNOSIS — R062 Wheezing: Secondary | ICD-10-CM

## 2021-05-01 MED ORDER — ALBUTEROL SULFATE HFA 108 (90 BASE) MCG/ACT IN AERS: 2.0000 | INHALATION_SPRAY | RESPIRATORY_TRACT | 0 refills | Status: DC | PRN

## 2021-05-01 NOTE — ED Provider Notes (Signed)
[facility]    CSN: [id number] Arrival date & time: 05/01/21  1353      History   Chief Complaint No chief complaint on file.   HPI Sheila Gardner is a 86 y.o. female.   About 1 week ago she started with tightness in her chest, head cold, cough.  She used mucinex the last week, and felt well yesterday, but then started feeling poorly again last night.  She started again cough, head cold, tightness in her chest.   No fever/chills.  No nausea/vomiting.  No covid exposures, but some family members have been sick with something.   She does get dizzy if she get up too quickly.   Past Medical History:  Diagnosis Date   Arthritis    Atrial fibrillation (HCC)    Breast cancer ([place])    Cancer ([facility])    Breast- rt   Cough with sputum 2016   Elevated liver function tests 05/09/2013   GERD (gastroesophageal reflux disease)    COSTOCHONDRITIS   Heart murmur    Hip pain 05/08/2014   Hyperlipidemia    Hypertension    Lipoma    back of neck   PONV (postoperative nausea and vomiting)    only after rt hip surgery   Primary osteoarthritis of left knee 11/04/2015   Primary osteoarthritis of right hip 07/30/2014   Squamous cell carcinoma of skin 08/21/2019   atypical squa. proliferation-Left corner of mouth    Patient Active Problem List   Diagnosis Date Noted   Sinus node dysfunction ([place]) 03/26/2021   Paroxysmal atrial fibrillation ([place]) 03/26/2021   Elevated troponin    NSTEMI (non-ST elevated myocardial infarction) ([place]) 12/07/2020   Dizziness 01/11/2020   Syncope 09/19/2019   Secondary hypercoagulable state ([place]) 06/15/2019   Persistent atrial fibrillation ([place]) 06/11/2019   CAD (coronary artery disease) 04/30/2019   CAD (coronary artery disease), native coronary artery 04/28/2019   Hyperlipidemia LDL goal <70 04/28/2019   Hypertension 04/28/2019   Primary osteoarthritis of left knee 11/04/2015   Primary osteoarthritis of right hip 07/30/2014   Hip pain 05/08/2014    Elevated liver function tests 05/09/2013   Vitamin D deficiency 03/25/2011   Malignant neoplasm of upper-outer quadrant of right breast in female, estrogen receptor positive ([place]) 12/30/2010    Past Surgical History:  Procedure Laterality Date   ANKLE FRACTURE SURGERY Right 2013   RIGHT    BREAST LUMPECTOMY Right 2011   BREAST SURGERY  04/22/2009   Rt lumpectomy   CARDIOVERSION N/A 02/19/2019   Procedure: CARDIOVERSION;  Surgeon: Dorothy Spark, MD;  Location: [facility];  Service: Cardiovascular;  Laterality: N/A;   CORONARY ATHERECTOMY N/A 04/30/2019   Procedure: CORONARY ATHERECTOMY;  Surgeon: Belva Crome, MD;  Location: [facility];  Service: Cardiovascular;  Laterality: N/A;  LAD   CORONARY STENT INTERVENTION N/A 04/30/2019   Procedure: CORONARY STENT INTERVENTION;  Surgeon: Belva Crome, MD;  Location: [facility];  Service: Cardiovascular;  Laterality: N/A;  DES TO PROX-MID LAD   EYE SURGERY  2001   macular hole   JOINT REPLACEMENT  2016   KNEE SURGERY     LEFT HEART CATH AND CORONARY ANGIOGRAPHY N/A 04/30/2019   Procedure: LEFT HEART CATH AND CORONARY ANGIOGRAPHY;  Surgeon: Belva Crome, MD;  Location: [facility];  Service: Cardiovascular;  Laterality: N/A;   LEFT HEART CATH AND CORONARY ANGIOGRAPHY N/A 12/08/2020   Procedure: LEFT HEART CATH AND CORONARY ANGIOGRAPHY;  Surgeon:  Troy Sine, MD;  Location: [facility];  Service: Cardiovascular;  Laterality: N/A;   MIDDLE EAR SURGERY Left 08/18/2015   repair eardrum and reconstruction   OVARY SURGERY  40 years ago   wedge   TOTAL HIP ARTHROPLASTY Right 07/30/2014   Procedure: TOTAL HIP ARTHROPLASTY ANTERIOR APPROACH;  Surgeon: Melrose Nakayama, MD;  Location: [facility];  Service: Orthopedics;  Laterality: Right;   TOTAL KNEE ARTHROPLASTY Left 11/04/2015   TOTAL KNEE ARTHROPLASTY Left 11/04/2015   Procedure: TOTAL KNEE ARTHROPLASTY;  Surgeon: Melrose Nakayama, MD;  Location: [facility];  Service:  Orthopedics;  Laterality: Left;   TYMPANOPLASTY  30 years ago    OB History   No obstetric history on file.      Home Medications    Prior to Admission medications   Medication Sig Start Date End Date Taking? Authorizing Provider  acetaminophen (TYLENOL) 650 MG CR tablet Take 1,300 mg by mouth every 8 (eight) hours as needed for pain.    [provider]  Ascorbic Acid (VITAMIN C) 1000 MG tablet Take 1,000 mg by mouth daily.     [provider]  BIOTIN PO Take 10,000 Units by mouth daily with breakfast.     [provider]  Cholecalciferol (VITAMIN D-3) 1000 UNITS CAPS Take 1,000 Units by mouth daily with breakfast.     [provider]  cyanocobalamin 1000 MCG tablet Take 1,000 mcg by mouth daily.     [provider]  dofetilide (TIKOSYN) 125 MCG capsule TAKE ONE CAPSULE BY MOUTH TWICE A DAY Patient taking differently: Take 125 mcg by mouth 2 (two) times daily. 09/11/20   Evans Lance, MD  ELIQUIS 5 MG TABS tablet Take 1 tablet (5 mg total) by mouth 2 (two) times daily. 02/26/21   Belva Crome, MD  ferrous sulfate 325 (65 FE) MG tablet Take 325 mg by mouth daily with breakfast.    [provider]  fexofenadine (ALLEGRA) 180 MG tablet Take 1 tablet by mouth as needed. 12/02/20   [provider]  furosemide (LASIX) 40 MG tablet Take 1 tablet (40 mg total) by mouth daily. TAKE 1 TABLET(40 MG) BY MOUTH DAILY Strength: 40 mg 02/03/21   Belva Crome, MD  gabapentin (NEURONTIN) 300 MG capsule Take 300 mg by mouth 2 (two) times daily. 12/02/20   [provider]  hydrALAZINE (APRESOLINE) 25 MG tablet Take 1 tablet (25 mg total) by mouth in the morning and at bedtime. 12/31/20   Imogene Burn, PA-C  iron polysaccharides (NIFEREX) 150 MG capsule Take 1 capsule by mouth 2 (two) times daily.    [provider]  LORazepam (ATIVAN) 1 MG tablet Take 1 mg by mouth at bedtime.    [provider]  magnesium oxide  (MAG-OX) 400 MG tablet Take 1 tablet (400 mg total) by mouth daily. 01/21/21   Evans Lance, MD  meclizine (ANTIVERT) 12.5 MG tablet Take 1 tablet (12.5 mg total) by mouth daily as needed for dizziness. 12/10/20   Cheryln Manly, NP  Melatonin 10 MG TABS Take 10 mg by mouth See admin instructions.    [provider]  Omega-3 Fatty Acids (OMEGA 3 500 PO) Take 1 tablet by mouth daily.    [provider]  omeprazole (PRILOSEC) 20 MG capsule Take 20 mg by mouth daily. 09/29/20   [provider]  OVER THE COUNTER MEDICATION Take 6 capsules by mouth daily. Balance of nature 3 capsules of fruit  and 3 capsules of vegetables    [provider]  potassium chloride (KLOR-CON) 10 MEQ tablet Take 4 tablets (40 mEq total) by mouth daily. 06/14/19   Shirley Friar, PA-C  pyridOXINE (VITAMIN B-6) 100 MG tablet Take 100 mg by mouth daily.    [provider]  rosuvastatin (CRESTOR) 40 MG tablet Take 1 tablet (40 mg total) by mouth daily at 6 PM. 11/26/19   Belva Crome, MD  valsartan (DIOVAN) 320 MG tablet Take 1 tablet (320 mg total) by mouth daily. 04/23/21   Belva Crome, MD  vitamin E 400 UNIT capsule Take 400 Units by mouth daily.    [provider]    Family History Family History  Problem Relation Age of Onset   Heart disease Mother    Cancer Brother        colon, pancreatic, brain    Social History Social History   Tobacco Use   Smoking status: Never   Smokeless tobacco: Never  Vaping Use   Vaping Use: Never used  Substance Use Topics   Alcohol use: Not Currently   Drug use: No     Allergies   Fenofibrate and Ondansetron   Review of Systems Review of Systems  Constitutional:  Positive for chills and fatigue. Negative for fever.  HENT:  Positive for congestion and rhinorrhea. Negative for sore throat.   Respiratory:  Positive for cough and wheezing.   Cardiovascular: Negative.   Gastrointestinal: Negative.    Genitourinary: Negative.     Physical Exam Triage Vital Signs ED Triage Vitals [05/01/21 1450]  Enc Vitals Group     BP (!) 190/63     Pulse Rate (!) 54     Resp 16     Temp 98.3 F (36.8 C)     Temp Source Oral     SpO2 100 %     Weight      Height      Head Circumference      Peak Flow      Pain Score      Pain Loc      Pain Edu?      Excl. in [place]?    No data found.  Updated Vital Signs BP (!) 190/63 (BP Location: Left Arm)    Pulse (!) 54    Temp 98.3 F (36.8 C) (Oral)    Resp 16    SpO2 100%   Visual Acuity Right Eye Distance:   Left Eye Distance:   Bilateral Distance:    Right Eye Near:   Left Eye Near:    Bilateral Near:     Physical Exam Constitutional:      Appearance: Normal appearance.  HENT:     Head: Normocephalic and atraumatic.     Nose: Congestion and rhinorrhea present.     Mouth/Throat:     Pharynx: Posterior oropharyngeal erythema present.  Cardiovascular:     Rate and Rhythm: Normal rate and regular rhythm.  Pulmonary:     Effort: Pulmonary effort is normal.     Breath sounds: Wheezing and rhonchi present.  Abdominal:     Palpations: Abdomen is soft.  Musculoskeletal:     Cervical back: Normal range of motion and neck supple.  Neurological:     Mental Status: She is alert.     UC Treatments / Results  Labs (all labs ordered are listed, but only abnormal results are displayed) Labs Reviewed - No data to display  EKG  Radiology DG Chest 2 View  Result Date: 05/01/2021 CLINICAL DATA:  Cough, abnormal breath sounds, congestion, history atrial fibrillation, breast cancer, GERD, hypertension EXAM: CHEST - 2 VIEW COMPARISON:  12/07/2020 FINDINGS: Loop recorder projects over LEFT chest. Enlargement of cardiac silhouette. Mediastinal contours and pulmonary vascularity normal. Atherosclerotic calcification aorta. Lungs clear. No pulmonary infiltrate, pleural effusion, or pneumothorax. Osseous structures unremarkable. IMPRESSION:  Enlargement of cardiac silhouette without acute abnormalities. Aortic Atherosclerosis (ICD10-I70.0). Electronically Signed   By: Lavonia Dana M.D.   On: 05/01/2021 15:37    Procedures Procedures (including critical care time)  Medications Ordered in UC Medications - No data to display  Initial Impression / Assessment and Plan / UC Course  I have reviewed the triage vital signs and the nursing notes.  Pertinent labs & imaging results that were available during my care of the patient were reviewed by me and considered in my medical decision making (see chart for details).    Final Clinical Impressions(s) / UC Diagnoses   Final diagnoses:  Acute cough  Wheezing  Acute upper respiratory infection     Discharge Instructions      You were seen today for upper respiratory symptoms.  You chest xray was normal and did not show any infection.  I have sent out an inhaler to use for wheezing.  I recommend you stop mucinex.  You may take over the counter zyrtec or claritin to help with nasal congestion and ear fullness.  You may take over the counter coricidin to help with the cough.  As discussed, I recommend you do a home covid test this evening.  Please follow up with your primary care provider or return if you are feeling worse over the next several days.     ED Prescriptions     Medication Sig Dispense Auth. Provider   albuterol (VENTOLIN HFA) 108 (90 Base) MCG/ACT inhaler Inhale 2 puffs into the lungs every 4 (four) hours as needed for wheezing or shortness of breath. 1 each Rondel Oh, MD      PDMP not reviewed this encounter.   Rondel Oh, MD 05/01/21 [phone]

## 2021-05-01 NOTE — ED Triage Notes (Signed)
Pt reports cough and congestion x 7 days. She is coughing up mucus.  Pt would like a covid test.

## 2021-05-01 NOTE — Telephone Encounter (Signed)
Pt here initially for scheduled lab work.  After getting her lab done, she mentioned to check in that she would like to see someone for complaints of chest discomfort due to productive cough for the last week. Check-in called triage for assistance with walk-in.  Pulled the pt to a pod to inquire more information about complaints.  Pt states for the last week she has had cold-like symptoms and productive cough. She denies fever at this time, but unsure if she has had one or not over the course of her complaints.   Pt states she has coughed so much it causes her chest to be sore.  She denies any sob, doe, palpitations, swelling, dizziness, N/V, pre-syncopal or syncopal episodes.   While speaking with the pt, noted she coughed during the majority of our conversation, and cough sounded very wet.   Pt in no obvious distress while speaking with her.  She states she has been taking OTC Mucinex W/OUT D for the last 5 days, and felt like she was turning the corner, but ran out of the med and symptoms returned.  She has not reached out to her PCP or Urgent Care for assistance with this.   She states she did a covid test on herself well over week ago, but has not done one recently. Pt is concerned about respiratory complaints and would like someone to listen to her lungs and call in an antibiotic if needed.   She states while she was already here for her scheduled blood work, she thought she could ask to see Dr. [name] or another Provider, so that they could listen to her lungs and treat her complaints accordingly.  Advised the pt that she should go to Urgent Care or call her PCP for further assistance with this.  Informed her she will need further testing to determine what type respiratory infection she has.  Advised her that she should get some more OTC Mucinex with out D, hydrate well, and report to Urgent Care now for evaluation of this.  Also provided the pt with our [facility] facilitated e-visit website and  where to find this on her mychart, if she didn't want to go into the office to see her PCP or Urgent care today for complaints. Advised her to keep [place] posted sometime next week, on how she's feeling.   Pt verbalized understanding and agrees with this plan.  Pt states she will go to [facility] now for further evaluation.  Pt was gracious for all the assistance provided.   Marland Kitchen   Pt aware I will make Dr. [name] and RN aware of this plan.

## 2021-05-01 NOTE — Discharge Instructions (Addendum)
You were seen today for upper respiratory symptoms.  You chest xray was normal and did not show any infection.  I have sent out an inhaler to use for wheezing.  I recommend you stop mucinex.  You may take over the counter zyrtec or claritin to help with nasal congestion and ear fullness.  You may take over the counter coricidin to help with the cough.  As discussed, I recommend you do a home covid test this evening.  Please follow up with your primary care provider or return if you are feeling worse over the next several days.

## 2021-05-02 LAB — BASIC METABOLIC PANEL
BUN/Creatinine Ratio: 19 (ref 12–28)
BUN: 11 mg/dL (ref 8–27)
CO2: 26 mmol/L (ref 20–29)
Calcium: 9.9 mg/dL (ref 8.7–10.3)
Chloride: 94 mmol/L — ABNORMAL LOW (ref 96–106)
Creatinine, Ser: 0.58 mg/dL (ref 0.57–1.00)
Glucose: 66 mg/dL — ABNORMAL LOW (ref 70–99)
Potassium: 4.8 mmol/L (ref 3.5–5.2)
Sodium: 133 mmol/L — ABNORMAL LOW (ref 134–144)
eGFR: 90 mL/min/{1.73_m2} (ref >59)

## 2021-05-02 LAB — SPECIMEN STATUS REPORT

## 2021-05-04 ENCOUNTER — Ambulatory Visit (INDEPENDENT_AMBULATORY_CARE_PROVIDER_SITE_OTHER): Payer: PPO

## 2021-05-04 ENCOUNTER — Encounter: Payer: Self-pay | Admitting: Interventional Cardiology

## 2021-05-04 DIAGNOSIS — R55 Syncope and collapse: Secondary | ICD-10-CM

## 2021-05-05 LAB — CUP PACEART REMOTE DEVICE CHECK
Date Time Interrogation Session: 20230224230855
Implantable Pulse Generator Implant Date: 20211109

## 2021-05-11 NOTE — Progress Notes (Signed)
Carelink Summary Report / Loop Recorder 

## 2021-05-18 ENCOUNTER — Other Ambulatory Visit: Payer: Self-pay | Admitting: Family Medicine

## 2021-05-18 DIAGNOSIS — N644 Mastodynia: Secondary | ICD-10-CM

## 2021-06-01 ENCOUNTER — Other Ambulatory Visit: Payer: Self-pay

## 2021-06-01 MED ORDER — ELIQUIS 5 MG PO TABS: 5.0000 mg | ORAL_TABLET | Freq: Two times a day (BID) | ORAL | 1 refills | Status: DC

## 2021-06-01 NOTE — Telephone Encounter (Signed)
Pt last saw Dr Lovena Le 03/26/21, last labs 05/01/21 Creat 0.58, age 85, weight 80.6kg, based on specified criteria pt is on appropriate dosage of Eliquis '5mg'$  BID for afib.  Will refill rx.  ?

## 2021-06-02 ENCOUNTER — Encounter: Payer: Medicare Other | Admitting: Internal Medicine

## 2021-06-03 DIAGNOSIS — Z822 Family history of deafness and hearing loss: Secondary | ICD-10-CM | POA: Diagnosis not present

## 2021-06-03 DIAGNOSIS — H9313 Tinnitus, bilateral: Secondary | ICD-10-CM | POA: Diagnosis not present

## 2021-06-03 DIAGNOSIS — H90A32 Mixed conductive and sensorineural hearing loss, unilateral, left ear with restricted hearing on the contralateral side: Secondary | ICD-10-CM | POA: Diagnosis not present

## 2021-06-03 DIAGNOSIS — H90A21 Sensorineural hearing loss, unilateral, right ear, with restricted hearing on the contralateral side: Secondary | ICD-10-CM | POA: Diagnosis not present

## 2021-06-04 ENCOUNTER — Ambulatory Visit (INDEPENDENT_AMBULATORY_CARE_PROVIDER_SITE_OTHER): Payer: PPO

## 2021-06-04 DIAGNOSIS — R55 Syncope and collapse: Secondary | ICD-10-CM

## 2021-06-04 LAB — CUP PACEART REMOTE DEVICE CHECK
Date Time Interrogation Session: 20230330091834
Implantable Pulse Generator Implant Date: 20211109

## 2021-06-08 ENCOUNTER — Ambulatory Visit: Payer: PPO

## 2021-06-11 ENCOUNTER — Ambulatory Visit
Admission: RE | Admit: 2021-06-11 | Discharge: 2021-06-11 | Disposition: A | Discharge status: Home / Self Care | Payer: PPO | Source: Ambulatory Visit | Attending: Family Medicine | Admitting: Family Medicine

## 2021-06-11 DIAGNOSIS — R922 Inconclusive mammogram: Secondary | ICD-10-CM | POA: Diagnosis not present

## 2021-06-11 DIAGNOSIS — N644 Mastodynia: Secondary | ICD-10-CM

## 2021-06-17 NOTE — Progress Notes (Signed)
Carelink Summary Report / Loop Recorder 

## 2021-06-24 DIAGNOSIS — H6121 Impacted cerumen, right ear: Secondary | ICD-10-CM | POA: Diagnosis not present

## 2021-06-24 DIAGNOSIS — H903 Sensorineural hearing loss, bilateral: Secondary | ICD-10-CM | POA: Diagnosis not present

## 2021-06-24 DIAGNOSIS — H9201 Otalgia, right ear: Secondary | ICD-10-CM | POA: Diagnosis not present

## 2021-06-26 DIAGNOSIS — G5601 Carpal tunnel syndrome, right upper limb: Secondary | ICD-10-CM | POA: Diagnosis not present

## 2021-06-26 DIAGNOSIS — M25532 Pain in left wrist: Secondary | ICD-10-CM | POA: Diagnosis not present

## 2021-07-07 ENCOUNTER — Ambulatory Visit (INDEPENDENT_AMBULATORY_CARE_PROVIDER_SITE_OTHER): Payer: PPO

## 2021-07-07 DIAGNOSIS — R55 Syncope and collapse: Secondary | ICD-10-CM | POA: Diagnosis not present

## 2021-07-07 LAB — CUP PACEART REMOTE DEVICE CHECK
Date Time Interrogation Session: 20230501231016
Implantable Pulse Generator Implant Date: 20211109

## 2021-07-14 ENCOUNTER — Other Ambulatory Visit: Payer: Self-pay

## 2021-07-14 MED ORDER — FUROSEMIDE 40 MG PO TABS: 40.0000 mg | ORAL_TABLET | Freq: Every day | ORAL | 1 refills | Status: DC

## 2021-07-21 NOTE — Progress Notes (Signed)
Carelink Summary Report / Loop Recorder 

## 2021-07-23 DIAGNOSIS — E559 Vitamin D deficiency, unspecified: Secondary | ICD-10-CM | POA: Diagnosis not present

## 2021-07-23 DIAGNOSIS — E1169 Type 2 diabetes mellitus with other specified complication: Secondary | ICD-10-CM | POA: Diagnosis not present

## 2021-07-23 DIAGNOSIS — E782 Mixed hyperlipidemia: Secondary | ICD-10-CM | POA: Diagnosis not present

## 2021-07-23 DIAGNOSIS — D72829 Elevated white blood cell count, unspecified: Secondary | ICD-10-CM | POA: Diagnosis not present

## 2021-07-23 DIAGNOSIS — I10 Essential (primary) hypertension: Secondary | ICD-10-CM | POA: Diagnosis not present

## 2021-07-30 ENCOUNTER — Telehealth: Payer: Self-pay | Admitting: Hematology and Oncology

## 2021-07-30 DIAGNOSIS — Z6831 Body mass index (BMI) 31.0-31.9, adult: Secondary | ICD-10-CM | POA: Diagnosis not present

## 2021-07-30 DIAGNOSIS — F411 Generalized anxiety disorder: Secondary | ICD-10-CM | POA: Diagnosis not present

## 2021-07-30 DIAGNOSIS — R710 Precipitous drop in hematocrit: Secondary | ICD-10-CM | POA: Diagnosis not present

## 2021-07-30 DIAGNOSIS — E1169 Type 2 diabetes mellitus with other specified complication: Secondary | ICD-10-CM | POA: Diagnosis not present

## 2021-07-30 DIAGNOSIS — M792 Neuralgia and neuritis, unspecified: Secondary | ICD-10-CM | POA: Diagnosis not present

## 2021-07-30 DIAGNOSIS — R5383 Other fatigue: Secondary | ICD-10-CM | POA: Diagnosis not present

## 2021-07-30 NOTE — Telephone Encounter (Signed)
Scheduled appt per 5/24 referral. Pt is aware of appt date and time. Pt is aware to arrive 15 mins prior to appt time and to bring and updated insurance card. Pt is aware of appt location.   

## 2021-08-07 DIAGNOSIS — E1169 Type 2 diabetes mellitus with other specified complication: Secondary | ICD-10-CM | POA: Diagnosis not present

## 2021-08-07 DIAGNOSIS — D649 Anemia, unspecified: Secondary | ICD-10-CM | POA: Diagnosis not present

## 2021-08-10 ENCOUNTER — Ambulatory Visit (INDEPENDENT_AMBULATORY_CARE_PROVIDER_SITE_OTHER): Payer: PPO

## 2021-08-10 DIAGNOSIS — R55 Syncope and collapse: Secondary | ICD-10-CM | POA: Diagnosis not present

## 2021-08-11 LAB — CUP PACEART REMOTE DEVICE CHECK
Date Time Interrogation Session: 20230606112502
Implantable Pulse Generator Implant Date: 20211109

## 2021-08-13 ENCOUNTER — Inpatient Hospital Stay: Payer: PPO | Admitting: Hematology and Oncology

## 2021-08-13 ENCOUNTER — Other Ambulatory Visit: Payer: Self-pay

## 2021-08-13 ENCOUNTER — Inpatient Hospital Stay: Payer: PPO | Attending: Hematology and Oncology | Admitting: Hematology and Oncology

## 2021-08-13 DIAGNOSIS — Z8249 Family history of ischemic heart disease and other diseases of the circulatory system: Secondary | ICD-10-CM | POA: Insufficient documentation

## 2021-08-13 DIAGNOSIS — D539 Nutritional anemia, unspecified: Secondary | ICD-10-CM | POA: Diagnosis not present

## 2021-08-13 DIAGNOSIS — I10 Essential (primary) hypertension: Secondary | ICD-10-CM | POA: Diagnosis not present

## 2021-08-13 DIAGNOSIS — Z853 Personal history of malignant neoplasm of breast: Secondary | ICD-10-CM | POA: Insufficient documentation

## 2021-08-13 DIAGNOSIS — Z800 Family history of malignant neoplasm of digestive organs: Secondary | ICD-10-CM | POA: Diagnosis not present

## 2021-08-13 DIAGNOSIS — Z7901 Long term (current) use of anticoagulants: Secondary | ICD-10-CM | POA: Diagnosis not present

## 2021-08-13 DIAGNOSIS — Z79899 Other long term (current) drug therapy: Secondary | ICD-10-CM | POA: Insufficient documentation

## 2021-08-13 DIAGNOSIS — Z85828 Personal history of other malignant neoplasm of skin: Secondary | ICD-10-CM | POA: Diagnosis not present

## 2021-08-13 DIAGNOSIS — D72821 Monocytosis (symptomatic): Secondary | ICD-10-CM | POA: Insufficient documentation

## 2021-08-13 NOTE — Assessment & Plan Note (Signed)
No signs of recurrence She can continue follow-up with primary care doctor No need long term follow-up at the cancer center

## 2021-08-13 NOTE — Assessment & Plan Note (Signed)
Multifactorial, could be related to mild GI blood loss from eliquis versus component of anemia of chronic illness She can continue daily iron supplement She does not need excessive work-up

## 2021-08-13 NOTE — Assessment & Plan Note (Signed)
This is not a consistent feature  Likely benign No need long term follow-up unless she has persistent absolute monocyte count of greater than 1.5 drawn 6 months apart

## 2021-08-13 NOTE — Progress Notes (Signed)
[facility] CONSULT NOTE  Patient Care Team: Lawerance Cruel, MD as PCP - General (Family Medicine) Belva Crome, MD as PCP - Cardiology (Cardiology) Evans Lance, MD as PCP - Electrophysiology (Clinical Cardiac Electrophysiology) Magrinat, Virgie Dad, MD (Inactive) as Consulting Physician (Hematology and Oncology) Thea Silversmith, MD as Consulting Physician (Radiation Oncology) Warren Danes, PA-C as Physician Assistant (Dermatology) [name], MD as Consulting Physician (Dermatology)  ASSESSMENT & PLAN:  History of right breast cancer No signs of recurrence She can continue follow-up with primary care doctor No need long term follow-up at the cancer center  Deficiency anemia Multifactorial, could be related to mild GI blood loss from eliquis versus component of anemia of chronic illness She can continue daily iron supplement She does not need excessive work-up  Monocytosis This is not a consistent feature  Likely benign No need long term follow-up unless she has persistent absolute monocyte count of greater than 1.5 drawn 6 months apart No orders of the defined types were placed in this encounter.   All questions were answered. The patient knows to call the clinic with any problems, questions or concerns.  The total time spent in the appointment was 55 minutes encounter with patients including review of chart and various tests results, discussions about plan of care and coordination of care plan  Heath Lark, MD 6/8/20235:36 PM   CHIEF COMPLAINTS/PURPOSE OF CONSULTATION:  Anemia and intermittent monocytosis  HISTORY OF PRESENTING ILLNESS:  Sheila Gardner 86 y.o. female is here because of anemia, intermittent monocytosis with background history of right breast cancer The patient was seen by Dr. Jana Hakim remotely several years ago for early stage right breast cancer She received adjuvant antiestrogen therapy for a brief period of time and then  was switched to observation only due to side effects of aromatase inhibitor Even though Dr. Jana Hakim documented approximately 2 years of treatment, according to the patient, she only took it for about 2 weeks Over the years, she had numerous CBC done I have the opportunity to review his CBC dated back to 04/16/2009 She is noted to have intermittent monocytosis Her total white blood cell count ranged from 4.9-16 intermittently and absolute monocyte count ranged from 0.6-1.2 intermittently  Her hemoglobin ranged between 9.5-13.2 but over the last 2 years, has been around 11-1/2  She denies recent chest pain on exertion, shortness of breath on minimal exertion, pre-syncopal episodes, or palpitations. She had not noticed any recent bleeding such as epistaxis, hematuria or hematochezia The patient denies over the counter NSAID ingestion. She is on anticoagulation therapy for her heart condition. She denies any pica and eats a variety of diet. She never donated blood or received blood transfusion The patient was prescribed oral iron supplements and she takes 2 iron pills daily She denies recent infection She is concerned about hair loss From the breast cancer standpoint, she continues on annual mammogram. She denies any recent abnormal breast examination, palpable mass, abnormal breast appearance or nipple changes   MEDICAL HISTORY:  Past Medical History:  Diagnosis Date   Arthritis    Atrial fibrillation ([place])    Breast cancer ([place])    Cancer ([place])    Breast- rt   Cough with sputum 2016   Elevated liver function tests 05/09/2013   GERD (gastroesophageal reflux disease)    COSTOCHONDRITIS   Heart murmur    Hip pain 05/08/2014   Hyperlipidemia    Hypertension    Lipoma    back of  neck   PONV (postoperative nausea and vomiting)    only after rt hip surgery   Primary osteoarthritis of left knee 11/04/2015   Primary osteoarthritis of right hip 07/30/2014   Squamous cell carcinoma of skin  08/21/2019   atypical squa. proliferation-Left corner of mouth    SURGICAL HISTORY: Past Surgical History:  Procedure Laterality Date   ANKLE FRACTURE SURGERY Right 2013   RIGHT    BREAST LUMPECTOMY Right 2011   BREAST SURGERY  04/22/2009   Rt lumpectomy   CARDIOVERSION N/A 02/19/2019   Procedure: CARDIOVERSION;  Surgeon: Dorothy Spark, MD;  Location: [facility];  Service: Cardiovascular;  Laterality: N/A;   CORONARY ATHERECTOMY N/A 04/30/2019   Procedure: CORONARY ATHERECTOMY;  Surgeon: Belva Crome, MD;  Location: [facility];  Service: Cardiovascular;  Laterality: N/A;  LAD   CORONARY STENT INTERVENTION N/A 04/30/2019   Procedure: CORONARY STENT INTERVENTION;  Surgeon: Belva Crome, MD;  Location: [facility];  Service: Cardiovascular;  Laterality: N/A;  DES TO PROX-MID LAD   EYE SURGERY  2001   macular hole   JOINT REPLACEMENT  2016   KNEE SURGERY     LEFT HEART CATH AND CORONARY ANGIOGRAPHY N/A 04/30/2019   Procedure: LEFT HEART CATH AND CORONARY ANGIOGRAPHY;  Surgeon: Belva Crome, MD;  Location: [facility];  Service: Cardiovascular;  Laterality: N/A;   LEFT HEART CATH AND CORONARY ANGIOGRAPHY N/A 12/08/2020   Procedure: LEFT HEART CATH AND CORONARY ANGIOGRAPHY;  Surgeon: Troy Sine, MD;  Location: [facility];  Service: Cardiovascular;  Laterality: N/A;   MIDDLE EAR SURGERY Left 08/18/2015   repair eardrum and reconstruction   OVARY SURGERY  40 years ago   wedge   TOTAL HIP ARTHROPLASTY Right 07/30/2014   Procedure: TOTAL HIP ARTHROPLASTY ANTERIOR APPROACH;  Surgeon: Melrose Nakayama, MD;  Location: [facility];  Service: Orthopedics;  Laterality: Right;   TOTAL KNEE ARTHROPLASTY Left 11/04/2015   TOTAL KNEE ARTHROPLASTY Left 11/04/2015   Procedure: TOTAL KNEE ARTHROPLASTY;  Surgeon: Melrose Nakayama, MD;  Location: [facility];  Service: Orthopedics;  Laterality: Left;   TYMPANOPLASTY  30 years ago    SOCIAL HISTORY: Social History    Socioeconomic History   Marital status: Widowed    Spouse name: Not on file   Number of children: Not on file   Years of education: Not on file   Highest education level: Not on file  Occupational History   Not on file  Tobacco Use   Smoking status: Never   Smokeless tobacco: Never  Vaping Use   Vaping Use: Never used  Substance and Sexual Activity   Alcohol use: Not Currently   Drug use: No   Sexual activity: Not Currently    Birth control/protection: None  Other Topics Concern   Not on file  Social History Narrative   Not on file   Social Determinants of Health   Financial Resource Strain: Not on file  Food Insecurity: Not on file  Transportation Needs: Not on file  Physical Activity: Not on file  Stress: Not on file  Social Connections: Not on file  Intimate Partner Violence: Not on file    FAMILY HISTORY: Family History  Problem Relation Age of Onset   Heart disease Mother    Cancer Brother        colon, pancreatic, brain    ALLERGIES:  is allergic to fenofibrate and ondansetron.  MEDICATIONS:  Current Outpatient Medications  Medication Sig  Dispense Refill   acetaminophen (TYLENOL) 650 MG CR tablet Take 1,300 mg by mouth every 8 (eight) hours as needed for pain.     Ascorbic Acid (VITAMIN C) 1000 MG tablet Take 1,000 mg by mouth daily.      BIOTIN PO Take 10,000 Units by mouth daily with breakfast.      Cholecalciferol (VITAMIN D-3) 1000 UNITS CAPS Take 1,000 Units by mouth daily with breakfast.      cyanocobalamin 1000 MCG tablet Take 1,000 mcg by mouth daily.      dofetilide (TIKOSYN) 125 MCG capsule TAKE ONE CAPSULE BY MOUTH TWICE A DAY (Patient taking differently: Take 125 mcg by mouth 2 (two) times daily.) 60 capsule 11   ELIQUIS 5 MG TABS tablet Take 1 tablet (5 mg total) by mouth 2 (two) times daily. 180 tablet 1   ferrous sulfate 325 (65 FE) MG tablet Take 325 mg by mouth daily with breakfast.     furosemide (LASIX) 40 MG tablet Take 1 tablet (40  mg total) by mouth daily. 90 tablet 1   gabapentin (NEURONTIN) 300 MG capsule Take 300 mg by mouth 2 (two) times daily.     hydrALAZINE (APRESOLINE) 25 MG tablet Take 1 tablet (25 mg total) by mouth in the morning and at bedtime. 180 tablet 3   iron polysaccharides (NIFEREX) 150 MG capsule Take 1 capsule by mouth 2 (two) times daily.     LORazepam (ATIVAN) 1 MG tablet Take 1 mg by mouth at bedtime.     magnesium oxide (MAG-OX) 400 MG tablet Take 1 tablet (400 mg total) by mouth daily. 90 tablet 3   meclizine (ANTIVERT) 12.5 MG tablet Take 1 tablet (12.5 mg total) by mouth daily as needed for dizziness. 30 tablet 0   Melatonin 10 MG TABS Take 10 mg by mouth See admin instructions.     Omega-3 Fatty Acids (OMEGA 3 500 PO) Take 1 tablet by mouth daily.     omeprazole (PRILOSEC) 20 MG capsule Take 20 mg by mouth daily.     OVER THE COUNTER MEDICATION Take 6 capsules by mouth daily. Balance of nature 3 capsules of fruit and 3 capsules of vegetables     potassium chloride (KLOR-CON) 10 MEQ tablet Take 4 tablets (40 mEq total) by mouth daily. 120 tablet 6   pyridOXINE (VITAMIN B-6) 100 MG tablet Take 100 mg by mouth daily.     rosuvastatin (CRESTOR) 40 MG tablet Take 1 tablet (40 mg total) by mouth daily at 6 PM. 30 tablet 6   valsartan (DIOVAN) 320 MG tablet Take 1 tablet (320 mg total) by mouth daily. 90 tablet 3   vitamin E 400 UNIT capsule Take 400 Units by mouth daily.     No current facility-administered medications for this visit.    REVIEW OF SYSTEMS:   Constitutional: Denies fevers, chills or abnormal night sweats Eyes: Denies blurriness of vision, double vision or watery eyes Ears, nose, mouth, throat, and face: Denies mucositis or sore throat Respiratory: Denies cough, dyspnea or wheezes Cardiovascular: Denies palpitation, chest discomfort or lower extremity swelling Gastrointestinal:  Denies nausea, heartburn or change in bowel habits Skin: Denies abnormal skin rashes Lymphatics:  Denies new lymphadenopathy or easy bruising Neurological:Denies numbness, tingling or new weaknesses Behavioral/Psych: Mood is stable, no new changes  All other systems were reviewed with the patient and are negative.  PHYSICAL EXAMINATION: ECOG PERFORMANCE STATUS: 0 - Asymptomatic  Vitals:   08/13/21 1301  BP: (!) 144/52  Pulse: Marland Kitchen)  55  Resp: 18  Temp: 98.4 F (36.9 C)  SpO2: 95%   Filed Weights   08/13/21 1301  Weight: 185 lb 6.4 oz (84.1 kg)    GENERAL:alert, no distress and comfortable SKIN: skin color, texture, turgor are normal, no rashes or significant lesions EYES: normal, conjunctiva are pink and non-injected, sclera clear OROPHARYNX:no exudate, no erythema and lips, buccal mucosa, and tongue normal  NECK: supple, thyroid normal size, non-tender, without nodularity LYMPH:  no palpable lymphadenopathy in the cervical, axillary or inguinal LUNGS: clear to auscultation and percussion with normal breathing effort HEART: regular rate & rhythm and no murmurs and no lower extremity edema ABDOMEN:abdomen soft, non-tender and normal bowel sounds Musculoskeletal:no cyanosis of digits and no clubbing  PSYCH: alert & oriented x 3 with fluent speech NEURO: no focal motor/sensory deficits Bilateral breast examination is performed and they were normal

## 2021-08-14 ENCOUNTER — Encounter: Payer: Self-pay | Admitting: Hematology and Oncology

## 2021-08-14 DIAGNOSIS — Z96652 Presence of left artificial knee joint: Secondary | ICD-10-CM | POA: Diagnosis not present

## 2021-08-24 NOTE — Progress Notes (Signed)
Carelink Summary Report / Loop Recorder 

## 2021-08-25 DIAGNOSIS — L659 Nonscarring hair loss, unspecified: Secondary | ICD-10-CM | POA: Diagnosis not present

## 2021-09-04 ENCOUNTER — Encounter: Payer: Self-pay | Admitting: Internal Medicine

## 2021-09-04 ENCOUNTER — Ambulatory Visit: Payer: PPO | Admitting: Internal Medicine

## 2021-09-04 ENCOUNTER — Telehealth: Payer: Self-pay | Admitting: Internal Medicine

## 2021-09-04 ENCOUNTER — Other Ambulatory Visit: Payer: PPO

## 2021-09-04 VITALS — BP 138/58 | HR 48 | Ht 64.5 in | Wt 183.0 lb

## 2021-09-04 DIAGNOSIS — I480 Paroxysmal atrial fibrillation: Secondary | ICD-10-CM | POA: Diagnosis not present

## 2021-09-04 DIAGNOSIS — I10 Essential (primary) hypertension: Secondary | ICD-10-CM | POA: Diagnosis not present

## 2021-09-04 DIAGNOSIS — R55 Syncope and collapse: Secondary | ICD-10-CM

## 2021-09-04 DIAGNOSIS — I495 Sick sinus syndrome: Secondary | ICD-10-CM

## 2021-09-04 NOTE — Telephone Encounter (Signed)
Patient calling the office for samples of medication:   1.  What medication and dosage are you requesting samples for? ELIQUIS 5 MG TABS tablet  2.  Are you currently out of this medication?  Yes. Pt states she is in the "donut hole" with her insurance and would like to know if there are some samples she can get while at her appt today with Dr. Lovena Le.

## 2021-09-04 NOTE — Patient Instructions (Addendum)
Medication Instructions:  Your physician recommends that you continue on your current medications as directed. Please refer to the Current Medication list given to you today.  Labwork: Please schedule lab draw for September 21, 2021; CBC, BMET  Testing/Procedures: None ordered.  Follow-Up: See INSTRUCTION LETTER   Any Other Special Instructions Will Be Listed Below (If Applicable).  If you need a refill on your cardiac medications before your next appointment, please call your pharmacy.   Important Information About Sugar       Pacemaker Implantation, Adult Pacemaker implantation is a procedure to place a pacemaker inside the chest. A pacemaker is a small computer that sends electrical signals to the heart and helps the heart beat normally. A pacemaker also stores information about heart rhythms. You may need pacemaker implantation if you have: A slow heartbeat (bradycardia). Loss of consciousness that happens repeatedly (syncope) or repeated episodes of dizziness or light-headedness because of an irregular heart rate. Shortness of breath (dyspnea) due to heart problems. The pacemaker usually attaches to your heart through a wire called a lead. One or two leads may be needed. There are different types of pacemakers: Transvenous pacemaker. This type is placed under the skin or muscle of your upper chest area. The lead goes through a vein in the chest area to reach the inside of the heart. Epicardial pacemaker. This type is placed under the skin or muscle of your chest or abdomen. The lead goes through your chest to the outside of the heart. Tell a health care provider about: Any allergies you have. All medicines you are taking, including vitamins, herbs, eye drops, creams, and over-the-counter medicines. Any problems you or family members have had with anesthetic medicines. Any blood or bone disorders you have. Any surgeries you have had. Any medical conditions you have. Whether you  are pregnant or may be pregnant. What are the risks? Generally, this is a safe procedure. However, problems may occur, including: Infection. Bleeding. Failure of the pacemaker or the lead. Collapse of a lung or bleeding into a lung. Blood clot inside a blood vessel with a lead. Damage to the heart. Infection inside the heart (endocarditis). Allergic reactions to medicines. What happens before the procedure? Staying hydrated Follow instructions from your health care provider about hydration, which may include: Up to 2 hours before the procedure - you may continue to drink clear liquids, such as water, clear fruit juice, black coffee, and plain tea.  Eating and drinking restrictions Follow instructions from your health care provider about eating and drinking, which may include: 8 hours before the procedure - stop eating heavy meals or foods, such as meat, fried foods, or fatty foods. 6 hours before the procedure - stop eating light meals or foods, such as toast or cereal. 6 hours before the procedure - stop drinking milk or drinks that contain milk. 2 hours before the procedure - stop drinking clear liquids. Medicines Ask your health care provider about: Changing or stopping your regular medicines. This is especially important if you are taking diabetes medicines or blood thinners. Taking medicines such as aspirin and ibuprofen. These medicines can thin your blood. Do not take these medicines unless your health care provider tells you to take them. Taking over-the-counter medicines, vitamins, herbs, and supplements. Tests You may have: A heart evaluation. This may include: An electrocardiogram (ECG). This involves placing patches on your skin to check your heart rhythm. A chest X-ray. An echocardiogram. This is a test that uses sound waves (ultrasound) to  produce an image of the heart. A cardiac rhythm monitor. This is used to record your heart rhythm and any events for a longer period  of time. Blood tests. Genetic testing. General instructions Do not use any products that contain nicotine or tobacco for at least 4 weeks before the procedure. These products include cigarettes, e-cigarettes, and chewing tobacco. If you need help quitting, ask your health care provider. Ask your health care provider: How your surgery site will be marked. What steps will be taken to help prevent infection. These steps may include: Removing hair at the surgery site. Washing skin with a germ-killing soap. Receiving antibiotic medicine. Plan to have someone take you home from the hospital or clinic. If you will be going home right after the procedure, plan to have someone with you for 24 hours. What happens during the procedure? An IV will be inserted into one of your veins. You will be given one or more of the following: A medicine to help you relax (sedative). A medicine to numb the area (local anesthetic). A medicine to make you fall asleep (general anesthetic). The next steps vary depending on the type of pacemaker you will be getting. If you are getting a transvenous pacemaker: An incision will be made in your upper chest. A pocket will be made for the pacemaker. It may be placed under the skin or between layers of muscle. The lead will be inserted into a blood vessel that goes to the heart. While X-rays are taken by an imaging machine (fluoroscopy), the lead will be advanced through the vein to the inside of your heart. The other end of the lead will be tunneled under the skin and attached to the pacemaker. If you are getting an epicardial pacemaker: An incision will be made near your ribs or breastbone (sternum) for the lead. The lead will be attached to the outside of your heart. Another incision will be made in your chest or upper abdomen to create a pocket for the pacemaker. The free end of the lead will be tunneled under the skin and attached to the pacemaker. The transvenous or  epicardial pacemaker will be tested. Imaging studies may be done to check the lead position. The incisions will be closed with stitches (sutures), adhesive strips, or skin glue. Bandages (dressings) will be placed over the incisions. The procedure may vary among health care providers and hospitals. What happens after the procedure? Your blood pressure, heart rate, breathing rate, and blood oxygen level will be monitored until you leave the hospital or clinic. You may be given antibiotics. You will be given pain medicine. An ECG and chest X-rays will be done. You may need to wear a continuous type of ECG (Holter monitor) to check your heart rhythm. Your health care provider will program the pacemaker. If you were given a sedative during the procedure, it can affect you for several hours. Do not drive or operate machinery until your health care provider says that it is safe. You will be given a pacemaker identification card. This card lists the implant date, device model, and manufacturer of your pacemaker. Summary A pacemaker is a small computer that sends electrical signals to the heart and helps the heart beat normally. There are different types of pacemakers. A pacemaker may be placed under the skin or muscle of your chest or abdomen. Follow instructions from your health care provider about eating and drinking and about taking medicines before the procedure. This information is not intended to replace  advice given to you by your health care provider. Make sure you discuss any questions you have with your health care provider. Document Revised: 11/04/2020 Document Reviewed: 01/24/2019 Elsevier Patient Education  [employer].

## 2021-09-04 NOTE — Telephone Encounter (Signed)
Called pt to inform her that we are leaving 1 box of Eliquis 5 mg tablet and a [employer] application for her to pick up at the front desk. I advised pt that if she has any other problems, questions or concerns, to give our office a call back. Pt verbalized understanding.  Lot# [id number]   Exp:  05/2023 FYI

## 2021-09-04 NOTE — Progress Notes (Signed)
HPI [name] returns today for followup. She is a pleasant 86 yo woman with a h/o PAF who has been maintained on dofetilide. She lost almost 10 lbs. She has had rare palpitations. She has class 2 dyspnea. She denies syncope but she does have weak spells which correlate with her bradycardia. Her HR is running slow.   Allergies  Allergen Reactions   Fenofibrate Other (See Comments)    elevated LFTs   Ondansetron Other (See Comments)    drug interaction     Current Outpatient Medications  Medication Sig Dispense Refill   acetaminophen (TYLENOL) 650 MG CR tablet Take 1,300 mg by mouth every 8 (eight) hours as needed for pain.     Ascorbic Acid (VITAMIN C) 1000 MG tablet Take 1,000 mg by mouth daily.      BIOTIN PO Take 10,000 Units by mouth daily with breakfast.      Cholecalciferol (VITAMIN D-3) 1000 UNITS CAPS Take 1,000 Units by mouth daily with breakfast.      cyanocobalamin 1000 MCG tablet Take 1,000 mcg by mouth daily.      dofetilide (TIKOSYN) 125 MCG capsule TAKE ONE CAPSULE BY MOUTH TWICE A DAY 60 capsule 11   ELIQUIS 5 MG TABS tablet Take 1 tablet (5 mg total) by mouth 2 (two) times daily. 180 tablet 1   ferrous sulfate 325 (65 FE) MG tablet Take 325 mg by mouth daily with breakfast.     furosemide (LASIX) 40 MG tablet Take 1 tablet (40 mg total) by mouth daily. 90 tablet 1   gabapentin (NEURONTIN) 300 MG capsule Take 300 mg by mouth 2 (two) times daily.     hydrALAZINE (APRESOLINE) 25 MG tablet Take 1 tablet (25 mg total) by mouth in the morning and at bedtime. 180 tablet 3   iron polysaccharides (NIFEREX) 150 MG capsule Take 1 capsule by mouth 2 (two) times daily.     LORazepam (ATIVAN) 1 MG tablet Take 1 mg by mouth at bedtime.     magnesium oxide (MAG-OX) 400 (240 Mg) MG tablet Take 1 tablet by mouth daily.     magnesium oxide (MAG-OX) 400 MG tablet Take 1 tablet (400 mg total) by mouth daily. 90 tablet 3   meclizine (ANTIVERT) 12.5 MG tablet Take 1 tablet (12.5  mg total) by mouth daily as needed for dizziness. 30 tablet 0   Melatonin 10 MG TABS Take 10 mg by mouth See admin instructions.     Multiple Vitamin (MULTI VITAMIN) TABS daily at 6 (six) AM.     Omega-3 Fatty Acids (OMEGA 3 500 PO) Take 1 tablet by mouth daily.     OVER THE COUNTER MEDICATION Take 6 capsules by mouth daily. Balance of nature 3 capsules of fruit and 3 capsules of vegetables     pantoprazole (PROTONIX) 20 MG tablet daily at 6 (six) AM.     potassium chloride (KLOR-CON) 10 MEQ tablet Take 4 tablets (40 mEq total) by mouth daily. 120 tablet 6   Probiotic Product ([employer] PROBIOTIC) CHEW daily at 6 (six) AM.     pyridOXINE (VITAMIN B-6) 100 MG tablet Take 100 mg by mouth daily.     rosuvastatin (CRESTOR) 40 MG tablet Take 1 tablet (40 mg total) by mouth daily at 6 PM. 30 tablet 6   valsartan (DIOVAN) 320 MG tablet Take 1 tablet (320 mg total) by mouth daily. 90 tablet 3   vitamin E 400 UNIT capsule Take 400 Units by mouth daily.  No current facility-administered medications for this visit.     Past Medical History:  Diagnosis Date   Arthritis    Atrial fibrillation (HCC)    Breast cancer ([place])    Cancer ([place])    Breast- rt   Cough with sputum 2016   Elevated liver function tests 05/09/2013   GERD (gastroesophageal reflux disease)    COSTOCHONDRITIS   Heart murmur    Hip pain 05/08/2014   Hyperlipidemia    Hypertension    Lipoma    back of neck   PONV (postoperative nausea and vomiting)    only after rt hip surgery   Primary osteoarthritis of left knee 11/04/2015   Primary osteoarthritis of right hip 07/30/2014   Squamous cell carcinoma of skin 08/21/2019   atypical squa. proliferation-Left corner of mouth    ROS:   All systems reviewed and negative except as noted in the HPI.   Past Surgical History:  Procedure Laterality Date   ANKLE FRACTURE SURGERY Right 2013   RIGHT    BREAST LUMPECTOMY Right 2011   BREAST SURGERY  04/22/2009   Rt lumpectomy    CARDIOVERSION N/A 02/19/2019   Procedure: CARDIOVERSION;  Surgeon: Dorothy Spark, MD;  Location: [facility];  Service: Cardiovascular;  Laterality: N/A;   CORONARY ATHERECTOMY N/A 04/30/2019   Procedure: CORONARY ATHERECTOMY;  Surgeon: Belva Crome, MD;  Location: [facility];  Service: Cardiovascular;  Laterality: N/A;  LAD   CORONARY STENT INTERVENTION N/A 04/30/2019   Procedure: CORONARY STENT INTERVENTION;  Surgeon: Belva Crome, MD;  Location: [facility] [facility];  Service: Cardiovascular;  Laterality: N/A;  DES TO PROX-MID LAD   EYE SURGERY  2001   macular hole   JOINT REPLACEMENT  2016   KNEE SURGERY     LEFT HEART CATH AND CORONARY ANGIOGRAPHY N/A 04/30/2019   Procedure: LEFT HEART CATH AND CORONARY ANGIOGRAPHY;  Surgeon: Belva Crome, MD;  Location: [facility] [facility];  Service: Cardiovascular;  Laterality: N/A;   LEFT HEART CATH AND CORONARY ANGIOGRAPHY N/A 12/08/2020   Procedure: LEFT HEART CATH AND CORONARY ANGIOGRAPHY;  Surgeon: Troy Sine, MD;  Location: [facility];  Service: Cardiovascular;  Laterality: N/A;   MIDDLE EAR SURGERY Left 08/18/2015   repair eardrum and reconstruction   OVARY SURGERY  40 years ago   wedge   TOTAL HIP ARTHROPLASTY Right 07/30/2014   Procedure: TOTAL HIP ARTHROPLASTY ANTERIOR APPROACH;  Surgeon: Melrose Nakayama, MD;  Location: [facility];  Service: Orthopedics;  Laterality: Right;   TOTAL KNEE ARTHROPLASTY Left 11/04/2015   TOTAL KNEE ARTHROPLASTY Left 11/04/2015   Procedure: TOTAL KNEE ARTHROPLASTY;  Surgeon: Melrose Nakayama, MD;  Location: [facility];  Service: Orthopedics;  Laterality: Left;   TYMPANOPLASTY  30 years ago     Family History  Problem Relation Age of Onset   Heart disease Mother    Cancer Brother        colon, pancreatic, brain     Social History   Socioeconomic History   Marital status: Widowed    Spouse name: Not on file   Number of children: Not on file   Years of education: Not on file   Highest  education level: Not on file  Occupational History   Not on file  Tobacco Use   Smoking status: Never   Smokeless tobacco: Never  Vaping Use   Vaping Use: Never used  Substance and Sexual Activity   Alcohol use: Not Currently  Drug use: No   Sexual activity: Not Currently    Birth control/protection: None  Other Topics Concern   Not on file  Social History Narrative   Not on file   Social Determinants of Health   Financial Resource Strain: Not on file  Food Insecurity: Not on file  Transportation Needs: Not on file  Physical Activity: Not on file  Stress: Not on file  Social Connections: Not on file  Intimate Partner Violence: Not on file     BP (!) 138/58   Pulse (!) 48   Ht 5' 4.5" (1.638 m)   Wt 183 lb (83 kg)   SpO2 98%   BMI 30.93 kg/m   Physical Exam:  Well appearing 86 yo woman, NAD HEENT: Unremarkable Neck:  No JVD, no thyromegally Lymphatics:  No adenopathy Back:  No CVA tenderness Lungs:  Clear with wheezes HEART:  Regular rate rhythm, no murmurs, no rubs, no clicks Abd:  soft, positive bowel sounds, no organomegally, no rebound, no guarding Ext:  2 plus pulses, no edema, no cyanosis, no clubbing Skin:  No rashes no nodules Neuro:  CN II through XII intact, motor grossly intact  DEVICE  Normal device function.  See PaceArt for details. She is in rhythm but slow in the 40's.   Assess/Plan:   PAF - she is maintaining NSR. She will continue dofetilide. Sinus node dysfunction - the patient has had episodes of HR's in the low 40's. I think that she has become symptomatic. I discussed the risks/benefits/goals/expectations and she wishes to proceed HTN - her bp is minimally elevated. We will follow. No change in her meds today. I asked her to take additional hydralazine if her bp was above 170.  Syncope - no pauses. No symptoms. We will follow.   Carleene Overlie Chera Slivka,MD

## 2021-09-07 DIAGNOSIS — H903 Sensorineural hearing loss, bilateral: Secondary | ICD-10-CM | POA: Diagnosis not present

## 2021-09-07 NOTE — Telephone Encounter (Signed)
Pt came and dropped off pt assistance application and was given 2 boxes of Eiquis 5 mg tablets until Jeani Hawking, LPN, pt assistance coordinator can address this matter. I advised pt that if she has any other problems, questions or concerns, to give our office a call back. Pt verbalize understanding.  FYI    Lot# [id number]   Exp: 3/25

## 2021-09-09 ENCOUNTER — Other Ambulatory Visit: Payer: Self-pay

## 2021-09-09 MED ORDER — DOFETILIDE 125 MCG PO CAPS: 125.0000 ug | ORAL_CAPSULE | Freq: Two times a day (BID) | ORAL | 11 refills | Status: DC

## 2021-09-10 ENCOUNTER — Other Ambulatory Visit: Payer: Self-pay | Admitting: *Deleted

## 2021-09-10 MED ORDER — DOFETILIDE 125 MCG PO CAPS: 125.0000 ug | ORAL_CAPSULE | Freq: Two times a day (BID) | ORAL | 11 refills | Status: DC

## 2021-09-10 NOTE — Telephone Encounter (Signed)
Provider portion of application has been completed and placed in Dr. Thompson Caul box for signature.

## 2021-09-12 LAB — CUP PACEART REMOTE DEVICE CHECK
Date Time Interrogation Session: 20230706231642
Implantable Pulse Generator Implant Date: 20211109

## 2021-09-14 ENCOUNTER — Ambulatory Visit (INDEPENDENT_AMBULATORY_CARE_PROVIDER_SITE_OTHER): Payer: PPO

## 2021-09-14 DIAGNOSIS — I480 Paroxysmal atrial fibrillation: Secondary | ICD-10-CM

## 2021-09-16 NOTE — Telephone Encounter (Signed)
Patient was calling in for update. Please advise  

## 2021-09-16 NOTE — Telephone Encounter (Signed)
Returned call to patient and informed her that the patient assistance forms for Eliquis were faxed to [facility] today.  Patient asked how long she can expect to wait before hearing back on if she is approved and when she will receive her medication. Informed patient I would reach out to our prior auth nurse Jeani Hawking Via) for this information.  Patient verbalized understanding and expressed appreciation for call.

## 2021-09-17 NOTE — Telephone Encounter (Signed)
**Note De-Identified Brownie Gockel Obfuscation** The pt is advised that I cannot answer her questions concerning [employer] and that she should be calling them directly for information about how long it takes to be approved/denied and how long it will take to get her Eliquis from them.  She states that she has enough Eliquis samples to last until next Saturday but is requesting samples now as she states she will run out before they get hers to her.  I advised her to call [employer] for an update daily (if need be) to check the progress of her application so she will know where she is in the process and can update [place] on the status of her application if she requires samples going forward.  She is aware to call me back next Wednesday or Thursday for Eliquis samples if [employer] has not made a determination or she is awaiting delivery of her Eliquis from [employer].  She verbalized understanding and thanked me for calling her to discuss.

## 2021-09-21 ENCOUNTER — Other Ambulatory Visit: Payer: PPO

## 2021-09-21 DIAGNOSIS — I495 Sick sinus syndrome: Secondary | ICD-10-CM

## 2021-09-21 DIAGNOSIS — I10 Essential (primary) hypertension: Secondary | ICD-10-CM | POA: Diagnosis not present

## 2021-09-21 DIAGNOSIS — I480 Paroxysmal atrial fibrillation: Secondary | ICD-10-CM

## 2021-09-21 DIAGNOSIS — R55 Syncope and collapse: Secondary | ICD-10-CM

## 2021-09-22 LAB — CBC WITH DIFFERENTIAL/PLATELET
Basophils Absolute: 0 10*3/uL (ref 0.0–0.2)
Basos: 0 %
EOS (ABSOLUTE): 0.2 10*3/uL (ref 0.0–0.4)
Eos: 2 %
Hematocrit: 34.7 % (ref 34.0–46.6)
Hemoglobin: 11.4 g/dL (ref 11.1–15.9)
Immature Grans (Abs): 0 10*3/uL (ref 0.0–0.1)
Immature Granulocytes: 0 %
Lymphocytes Absolute: 2 10*3/uL (ref 0.7–3.1)
Lymphs: 29 %
MCH: 31 pg (ref 26.6–33.0)
MCHC: 32.9 g/dL (ref 31.5–35.7)
MCV: 94 fL (ref 79–97)
Monocytes Absolute: 0.8 10*3/uL (ref 0.1–0.9)
Monocytes: 12 %
Neutrophils Absolute: 3.8 10*3/uL (ref 1.4–7.0)
Neutrophils: 57 %
Platelets: 267 10*3/uL (ref 150–450)
RBC: 3.68 x10E6/uL — ABNORMAL LOW (ref 3.77–5.28)
RDW: 12.6 % (ref 11.7–15.4)
WBC: 6.8 10*3/uL (ref 3.4–10.8)

## 2021-09-22 LAB — BASIC METABOLIC PANEL
BUN/Creatinine Ratio: 17 (ref 12–28)
BUN: 12 mg/dL (ref 8–27)
CO2: 25 mmol/L (ref 20–29)
Calcium: 10.5 mg/dL — ABNORMAL HIGH (ref 8.7–10.3)
Chloride: 96 mmol/L (ref 96–106)
Creatinine, Ser: 0.69 mg/dL (ref 0.57–1.00)
Glucose: 149 mg/dL — ABNORMAL HIGH (ref 70–99)
Potassium: 4.3 mmol/L (ref 3.5–5.2)
Sodium: 136 mmol/L (ref 134–144)
eGFR: 86 mL/min/{1.73_m2} (ref >59)

## 2021-09-24 ENCOUNTER — Ambulatory Visit: Payer: PPO | Admitting: Interventional Cardiology

## 2021-09-24 ENCOUNTER — Encounter: Payer: Self-pay | Admitting: Interventional Cardiology

## 2021-09-24 VITALS — BP 130/64 | HR 53 | Ht 64.5 in | Wt 182.4 lb

## 2021-09-24 DIAGNOSIS — I452 Bifascicular block: Secondary | ICD-10-CM

## 2021-09-24 DIAGNOSIS — I480 Paroxysmal atrial fibrillation: Secondary | ICD-10-CM

## 2021-09-24 DIAGNOSIS — I495 Sick sinus syndrome: Secondary | ICD-10-CM | POA: Diagnosis not present

## 2021-09-24 DIAGNOSIS — Z7901 Long term (current) use of anticoagulants: Secondary | ICD-10-CM | POA: Diagnosis not present

## 2021-09-24 DIAGNOSIS — R55 Syncope and collapse: Secondary | ICD-10-CM | POA: Diagnosis not present

## 2021-09-24 NOTE — Patient Instructions (Addendum)
Medication Instructions:  Your physician recommends that you continue on your current medications as directed. Please refer to the Current Medication list given to you today.  *If you need a refill on your cardiac medications before your next appointment, please call your pharmacy*  Lab Work: NONE  Testing/Procedures: NONE  Follow-Up: At [facility], you and your health needs are our priority.  As part of our continuing mission to provide you with exceptional heart care, we have created designated Provider Care Teams.  These Care Teams include your primary Cardiologist (physician) and Advanced Practice Providers (APPs -  Physician Assistants and Nurse Practitioners) who all work together to provide you with the care you need, when you need it.  Your next appointment:   6 month(s)  Please call our office at [phone] in September/October to schedule your follow-up appointment with Dr. [name] due in January 2024.  The format for your next appointment:   In Person  Provider:   Sinclair Grooms, MD {  Important Information About Sugar

## 2021-09-24 NOTE — Telephone Encounter (Signed)
Received fax from [employer] stating that pt is not eligible to receive Eliquis for the following reasons:  1) The patient's household income is over the current eligibility criteria. 2) Documentation of 3% out-of-pocket prescription expenses, based on household adjusted gross income, not met.   Application Case #: [id number]

## 2021-09-24 NOTE — Progress Notes (Signed)
Cardiology Office Note:    Date:  09/24/2021   ID:  Sheila Gardner, DOB 03/23/1935, MRN [id number]  PCP:  Lawerance Cruel, MD  Cardiologist:  Sinclair Grooms, MD   Referring MD: Lawerance Cruel, MD   Chief Complaint  Patient presents with   Atrial Fibrillation    History of Present Illness:    Sheila Gardner is a 86 y.o. female with a hx of persistent atrial fibrillation with rhythm control on Tikosyn, anticoagulation, heart murmur, hypertension, h/o syncope, and osteoarthritis.  She is here to discuss whether she should have the pacemaker as recommended by Dr. Lovena Le.  She had an event monitor done in December 2022 that demonstrated a heart rates varying between 33 and 79 bpm raising the question of chronotropic incompetence.  She does have symptoms but is never had syncope.  She is on dofetilide to suppress recurrent atrial fibrillation which causes incapacitation.  Past Medical History:  Diagnosis Date   Arthritis    Atrial fibrillation (HCC)    Breast cancer ([place])    Cancer ([place])    Breast- rt   Cough with sputum 2016   Elevated liver function tests 05/09/2013   GERD (gastroesophageal reflux disease)    COSTOCHONDRITIS   Heart murmur    Hip pain 05/08/2014   Hyperlipidemia    Hypertension    Lipoma    back of neck   PONV (postoperative nausea and vomiting)    only after rt hip surgery   Primary osteoarthritis of left knee 11/04/2015   Primary osteoarthritis of right hip 07/30/2014   Squamous cell carcinoma of skin 08/21/2019   atypical squa. proliferation-Left corner of mouth    Past Surgical History:  Procedure Laterality Date   ANKLE FRACTURE SURGERY Right 2013   RIGHT    BREAST LUMPECTOMY Right 2011   BREAST SURGERY  04/22/2009   Rt lumpectomy   CARDIOVERSION N/A 02/19/2019   Procedure: CARDIOVERSION;  Surgeon: Dorothy Spark, MD;  Location: [facility];  Service: Cardiovascular;  Laterality: N/A;   CORONARY ATHERECTOMY N/A 04/30/2019   Procedure:  CORONARY ATHERECTOMY;  Surgeon: Belva Crome, MD;  Location: [facility];  Service: Cardiovascular;  Laterality: N/A;  LAD   CORONARY STENT INTERVENTION N/A 04/30/2019   Procedure: CORONARY STENT INTERVENTION;  Surgeon: Belva Crome, MD;  Location: [facility];  Service: Cardiovascular;  Laterality: N/A;  DES TO PROX-MID LAD   EYE SURGERY  2001   macular hole   JOINT REPLACEMENT  2016   KNEE SURGERY     LEFT HEART CATH AND CORONARY ANGIOGRAPHY N/A 04/30/2019   Procedure: LEFT HEART CATH AND CORONARY ANGIOGRAPHY;  Surgeon: Belva Crome, MD;  Location: [facility];  Service: Cardiovascular;  Laterality: N/A;   LEFT HEART CATH AND CORONARY ANGIOGRAPHY N/A 12/08/2020   Procedure: LEFT HEART CATH AND CORONARY ANGIOGRAPHY;  Surgeon: Troy Sine, MD;  Location: [facility];  Service: Cardiovascular;  Laterality: N/A;   MIDDLE EAR SURGERY Left 08/18/2015   repair eardrum and reconstruction   OVARY SURGERY  40 years ago   wedge   TOTAL HIP ARTHROPLASTY Right 07/30/2014   Procedure: TOTAL HIP ARTHROPLASTY ANTERIOR APPROACH;  Surgeon: Melrose Nakayama, MD;  Location: [facility];  Service: Orthopedics;  Laterality: Right;   TOTAL KNEE ARTHROPLASTY Left 11/04/2015   TOTAL KNEE ARTHROPLASTY Left 11/04/2015   Procedure: TOTAL KNEE ARTHROPLASTY;  Surgeon: Melrose Nakayama, MD;  Location: [facility];  Service:  Orthopedics;  Laterality: Left;   TYMPANOPLASTY  30 years ago    Current Medications: Current Meds  Medication Sig   acetaminophen (TYLENOL) 650 MG CR tablet Take 1,300 mg by mouth every 8 (eight) hours as needed for pain.   Ascorbic Acid (VITAMIN C) 1000 MG tablet Take 1,000 mg by mouth daily.    BIOTIN PO Take 10,000 Units by mouth daily with breakfast.    Cholecalciferol (VITAMIN D-3) 1000 UNITS CAPS Take 1,000 Units by mouth daily with breakfast.    cyanocobalamin 1000 MCG tablet Take 1,000 mcg by mouth daily.    dofetilide (TIKOSYN) 125 MCG capsule Take 1 capsule (125 mcg  total) by mouth 2 (two) times daily.   ELIQUIS 5 MG TABS tablet Take 1 tablet (5 mg total) by mouth 2 (two) times daily.   ferrous sulfate 325 (65 FE) MG tablet Take 325 mg by mouth daily with breakfast.   furosemide (LASIX) 40 MG tablet Take 1 tablet (40 mg total) by mouth daily.   gabapentin (NEURONTIN) 300 MG capsule Take 300 mg by mouth 2 (two) times daily.   hydrALAZINE (APRESOLINE) 25 MG tablet Take 1 tablet (25 mg total) by mouth in the morning and at bedtime.   iron polysaccharides (NIFEREX) 150 MG capsule Take 1 capsule by mouth 2 (two) times daily.   LORazepam (ATIVAN) 1 MG tablet Take 1 mg by mouth at bedtime.   magnesium oxide (MAG-OX) 400 MG tablet Take 1 tablet (400 mg total) by mouth daily.   meclizine (ANTIVERT) 12.5 MG tablet Take 1 tablet (12.5 mg total) by mouth daily as needed for dizziness.   Melatonin 10 MG TABS Take 10 mg by mouth See admin instructions.   Multiple Vitamin (MULTI VITAMIN) TABS daily at 6 (six) AM.   Omega-3 Fatty Acids (OMEGA 3 500 PO) Take 1 tablet by mouth daily.   OVER THE COUNTER MEDICATION Take 6 capsules by mouth daily. Balance of nature 3 capsules of fruit and 3 capsules of vegetables   pantoprazole (PROTONIX) 20 MG tablet daily at 6 (six) AM.   potassium chloride (KLOR-CON) 10 MEQ tablet Take 4 tablets (40 mEq total) by mouth daily.   Probiotic Product ([employer]S PROBIOTIC) CHEW daily at 6 (six) AM.   pyridOXINE (VITAMIN B-6) 100 MG tablet Take 100 mg by mouth daily.   rosuvastatin (CRESTOR) 40 MG tablet Take 1 tablet (40 mg total) by mouth daily at 6 PM.   valsartan (DIOVAN) 320 MG tablet Take 1 tablet (320 mg total) by mouth daily.   vitamin E 400 UNIT capsule Take 400 Units by mouth daily.     Allergies:   Fenofibrate and Ondansetron   Social History   Socioeconomic History   Marital status: Widowed    Spouse name: Not on file   Number of children: Not on file   Years of education: Not on file   Highest education level: Not on file   Occupational History   Not on file  Tobacco Use   Smoking status: Never   Smokeless tobacco: Never  Vaping Use   Vaping Use: Never used  Substance and Sexual Activity   Alcohol use: Not Currently   Drug use: No   Sexual activity: Not Currently    Birth control/protection: None  Other Topics Concern   Not on file  Social History Narrative   Not on file   Social Determinants of Health   Financial Resource Strain: Not on file  Food Insecurity: Not on file  Transportation  Needs: Not on file  Physical Activity: Not on file  Stress: Not on file  Social Connections: Not on file     Family History: The patient's family history includes Cancer in her brother; Heart disease in her mother.  ROS:   Please see the history of present illness.    Feels she is having hair loss from being on Eliquis.  All other systems reviewed and are negative.  EKGs/Labs/Other Studies Reviewed:    The following studies were reviewed today: 14-day monitor: Study Highlights    Basic underlying rhythm is Sinus Bradycardia with avg HR 42 bpm, with range 33-79 bpm. 7 beat SVT at 114 bpm No atrial fibrillation Rare PVC's and PAC's     Patch Wear Time:  3 days and 0 hours (2022-12-03T13:24:29-0500 to 2022-12-06T13:25:45-0500)   Patient had a min HR of 33 bpm, max HR of 114 bpm, and avg HR of 42 bpm. Predominant underlying rhythm was Sinus Rhythm. First Degree AV Block was present. Bundle Branch Block/IVCD was present. 2 Supraventricular Tachycardia runs occurred, the run with  the fastest interval lasting 7 beats with a max rate of 114 bpm (avg 99 bpm); the run with the fastest interval was also the longest. Isolated SVEs were rare (<1.0%), SVE Couplets were rare (<1.0%), and SVE Triplets were rare (<1.0%). Isolated VEs were  rare (<1.0%), and no VE Couplets or VE Triplets were present.    EKG:  EKG last EKG revealed right bundle, left anterior hemiblock, and sinus bradycardia at 47 bpm in June  2023.  Recent Labs: 12/09/2020: Magnesium 2.2 09/21/2021: BUN 12; Creatinine, Ser 0.69; Hemoglobin 11.4; Platelets 267; Potassium 4.3; Sodium 136  Recent Lipid Panel    Component Value Date/Time   CHOL 124 12/08/2020 0307   TRIG 115 12/08/2020 0307   HDL 57 12/08/2020 0307   CHOLHDL 2.2 12/08/2020 0307   VLDL 23 12/08/2020 0307   LDLCALC 44 12/08/2020 0307    Physical Exam:    VS:  BP 130/64   Pulse (!) 53   Ht 5' 4.5" (1.638 m)   Wt 182 lb 6.4 oz (82.7 kg)   SpO2 99%   BMI 30.83 kg/m     Wt Readings from Last 3 Encounters:  09/24/21 182 lb 6.4 oz (82.7 kg)  09/04/21 183 lb (83 kg)  08/13/21 185 lb 6.4 oz (84.1 kg)     GEN: Appears younger than stated age. No acute distress HEENT: Normal NECK: No JVD. LYMPHATICS: No lymphadenopathy CARDIAC: No murmur.  Bradycardia with RR no gallop, or edema. VASCULAR:  Normal Pulses. No bruits. RESPIRATORY:  Clear to auscultation without rales, wheezing or rhonchi  ABDOMEN: Soft, non-tender, non-distended, No pulsatile mass, MUSCULOSKELETAL: No deformity  SKIN: Warm and dry NEUROLOGIC:  Alert and oriented x 3 PSYCHIATRIC:  Normal affect   ASSESSMENT:    1. Sinus node dysfunction (HCC)   2. Paroxysmal atrial fibrillation ([place])   3. Syncope, unspecified syncope type   4. Chronic anticoagulation    PLAN:    In order of problems listed above:  Recommend that she proceed with permanent pacemaker therapy to avoid having a bradycardia related loss of consciousness on anticoagulation therapy.  She has underlying fascicular block due to right bundle and left anterior hemiblock.  I will plan to see her back in 6 months.   Medication Adjustments/Labs and Tests Ordered: Current medicines are reviewed at length with the patient today.  Concerns regarding medicines are outlined above.  No orders of the defined types  were placed in this encounter.  No orders of the defined types were placed in this encounter.   Patient Instructions   Medication Instructions:  Your physician recommends that you continue on your current medications as directed. Please refer to the Current Medication list given to you today.  *If you need a refill on your cardiac medications before your next appointment, please call your pharmacy*  Lab Work: NONE  Testing/Procedures: NONE  Follow-Up: At [facility], you and your health needs are our priority.  As part of our continuing mission to provide you with exceptional heart care, we have created designated Provider Care Teams.  These Care Teams include your primary Cardiologist (physician) and Advanced Practice Providers (APPs -  Physician Assistants and Nurse Practitioners) who all work together to provide you with the care you need, when you need it.  Your next appointment:   6 month(s)  Please call our office at [phone] in September/October to schedule your follow-up appointment with Dr. [name] due in January 2024.  The format for your next appointment:   In Person  Provider:   Sinclair Grooms, MD {  Important Information About Sugar         Signed, Sinclair Grooms, MD  09/24/2021 5:16 PM    [place]

## 2021-09-28 NOTE — Telephone Encounter (Signed)
**Note De-Identified Sheila Gardner Obfuscation** Letter received from [facility] stating that they have approved the pt for Elquis asst until 03/07/2022. HQRF#[id number].  The letter states that they have notified the pt of this approval as well.

## 2021-09-30 ENCOUNTER — Telehealth: Payer: Self-pay

## 2021-09-30 NOTE — Telephone Encounter (Signed)
Left message for patient advising her that the time for her procedure with Dr. Lovena Le on 8/7 has been moved. The procedure will now be at 11:30am so she will need to arrive at 9:30am. Advised to call back or reply in MyChart to confirm time change.

## 2021-10-01 ENCOUNTER — Telehealth: Payer: Self-pay

## 2021-10-01 NOTE — Telephone Encounter (Signed)
Outreach made to Pt.  Confirmed change in arrival time for procedure.  All questions answered.

## 2021-10-05 NOTE — Progress Notes (Signed)
Carelink Summary Report / Loop Recorder 

## 2021-10-09 DIAGNOSIS — H16142 Punctate keratitis, left eye: Secondary | ICD-10-CM | POA: Diagnosis not present

## 2021-10-09 DIAGNOSIS — H524 Presbyopia: Secondary | ICD-10-CM | POA: Diagnosis not present

## 2021-10-09 DIAGNOSIS — H5203 Hypermetropia, bilateral: Secondary | ICD-10-CM | POA: Diagnosis not present

## 2021-10-09 DIAGNOSIS — H52223 Regular astigmatism, bilateral: Secondary | ICD-10-CM | POA: Diagnosis not present

## 2021-10-09 NOTE — Pre-Procedure Instructions (Signed)
Attempted to call patient regarding procedure instructions.  Left voice mail on the following items: Arrival time 0930 Nothing to eat or drink after midnight No meds AM of procedure Responsible person to drive you home and stay with you for 24 hrs Wash with special soap night before and morning of procedure If on anti-coagulant drug instructions Eliquis- last dose Friday evening

## 2021-10-12 ENCOUNTER — Encounter (HOSPITAL_COMMUNITY)
Admission: RE | Disposition: A | Discharge status: Home / Self Care | Payer: Self-pay | Source: Home / Self Care | Attending: Internal Medicine

## 2021-10-12 ENCOUNTER — Ambulatory Visit (HOSPITAL_COMMUNITY)
Admission: RE | Admit: 2021-10-12 | Discharge: 2021-10-13 | Disposition: A | Discharge status: Home / Self Care | Payer: PPO | Attending: Internal Medicine | Admitting: Internal Medicine

## 2021-10-12 ENCOUNTER — Other Ambulatory Visit: Payer: Self-pay

## 2021-10-12 ENCOUNTER — Other Ambulatory Visit (HOSPITAL_COMMUNITY): Payer: Self-pay

## 2021-10-12 ENCOUNTER — Encounter (HOSPITAL_COMMUNITY): Payer: Self-pay | Admitting: Internal Medicine

## 2021-10-12 ENCOUNTER — Telehealth (HOSPITAL_COMMUNITY): Payer: Self-pay

## 2021-10-12 DIAGNOSIS — R55 Syncope and collapse: Secondary | ICD-10-CM | POA: Insufficient documentation

## 2021-10-12 DIAGNOSIS — R2689 Other abnormalities of gait and mobility: Secondary | ICD-10-CM | POA: Insufficient documentation

## 2021-10-12 DIAGNOSIS — I495 Sick sinus syndrome: Secondary | ICD-10-CM | POA: Diagnosis not present

## 2021-10-12 DIAGNOSIS — Z7901 Long term (current) use of anticoagulants: Secondary | ICD-10-CM | POA: Insufficient documentation

## 2021-10-12 DIAGNOSIS — I10 Essential (primary) hypertension: Secondary | ICD-10-CM | POA: Insufficient documentation

## 2021-10-12 DIAGNOSIS — I480 Paroxysmal atrial fibrillation: Secondary | ICD-10-CM | POA: Diagnosis not present

## 2021-10-12 DIAGNOSIS — R0600 Dyspnea, unspecified: Secondary | ICD-10-CM | POA: Diagnosis not present

## 2021-10-12 HISTORY — PX: PACEMAKER IMPLANT: EP1218

## 2021-10-12 HISTORY — PX: LOOP RECORDER REMOVAL: EP1215

## 2021-10-12 LAB — BASIC METABOLIC PANEL
Anion gap: 14 (ref 5–15)
BUN: 15 mg/dL (ref 8–23)
CO2: 23 mmol/L (ref 22–32)
Calcium: 9.7 mg/dL (ref 8.9–10.3)
Chloride: 99 mmol/L (ref 98–111)
Creatinine, Ser: 0.57 mg/dL (ref 0.44–1.00)
GFR, Estimated: >60 mL/min (ref >60)
Glucose, Bld: 138 mg/dL — ABNORMAL HIGH (ref 70–99)
Potassium: 4.1 mmol/L (ref 3.5–5.1)
Sodium: 136 mmol/L (ref 135–145)

## 2021-10-12 LAB — MAGNESIUM: Magnesium: 2 mg/dL (ref 1.7–2.4)

## 2021-10-12 SURGERY — PACEMAKER IMPLANT

## 2021-10-12 MED ORDER — CVS PROBIOTIC PO CHEW: 1.0000 | CHEWABLE_TABLET | Freq: Every day | ORAL | Status: DC

## 2021-10-12 MED ORDER — POLYSACCHARIDE IRON COMPLEX 150 MG PO CAPS
150.0000 mg | ORAL_CAPSULE | Freq: Two times a day (BID) | ORAL | Status: DC
  Administered 2021-10-12 – 2021-10-13 (×2): 150 mg via ORAL
  Filled 2021-10-12 (×2): qty 1

## 2021-10-12 MED ORDER — BIOTIN 5000 MCG PO TABS
5000.0000 [IU] | ORAL_TABLET | Freq: Every day | ORAL | Status: DC
Start: 2021-10-13 — End: 2021-10-12

## 2021-10-12 MED ORDER — POVIDONE-IODINE 10 % EX SWAB
2.0000 | Freq: Once | CUTANEOUS | Status: AC
  Administered 2021-10-12: 2 via TOPICAL

## 2021-10-12 MED ORDER — FUROSEMIDE 40 MG PO TABS
40.0000 mg | ORAL_TABLET | Freq: Every day | ORAL | Status: DC
Start: 2021-10-12 — End: 2021-10-13
  Administered 2021-10-12 – 2021-10-13 (×2): 40 mg via ORAL
  Filled 2021-10-12 (×2): qty 1

## 2021-10-12 MED ORDER — MELATONIN 5 MG PO TABS: 10.0000 mg | ORAL_TABLET | Freq: Every evening | ORAL | Status: DC | PRN

## 2021-10-12 MED ORDER — SODIUM CHLORIDE 0.9 % IV SOLN
80.0000 mg | INTRAVENOUS | Status: AC
  Administered 2021-10-12: 80 mg

## 2021-10-12 MED ORDER — DOFETILIDE 125 MCG PO CAPS
125.0000 ug | ORAL_CAPSULE | Freq: Two times a day (BID) | ORAL | Status: DC
  Filled 2021-10-12 (×2): qty 1

## 2021-10-12 MED ORDER — LORAZEPAM 1 MG PO TABS
1.0000 mg | ORAL_TABLET | Freq: Two times a day (BID) | ORAL | Status: DC | PRN
Start: 2021-10-12 — End: 2021-10-13
  Administered 2021-10-12: 1 mg via ORAL
  Filled 2021-10-12: qty 1

## 2021-10-12 MED ORDER — SODIUM CHLORIDE 0.9 % IV SOLN: INTRAVENOUS | Status: DC

## 2021-10-12 MED ORDER — CEFAZOLIN SODIUM-DEXTROSE 1-4 GM/50ML-% IV SOLN
1.0000 g | Freq: Four times a day (QID) | INTRAVENOUS | Status: AC
  Administered 2021-10-12 – 2021-10-13 (×3): 1 g via INTRAVENOUS
  Filled 2021-10-12 (×3): qty 50

## 2021-10-12 MED ORDER — FERROUS SULFATE 325 (65 FE) MG PO TABS
325.0000 mg | ORAL_TABLET | Freq: Every day | ORAL | Status: DC
  Administered 2021-10-13: 325 mg via ORAL
  Filled 2021-10-12: qty 1

## 2021-10-12 MED ORDER — FENTANYL CITRATE (PF) 100 MCG/2ML IJ SOLN
INTRAMUSCULAR | Status: AC
  Filled 2021-10-12: qty 2

## 2021-10-12 MED ORDER — MECLIZINE HCL 25 MG PO TABS: 12.5000 mg | ORAL_TABLET | Freq: Every day | ORAL | Status: DC | PRN

## 2021-10-12 MED ORDER — GABAPENTIN 300 MG PO CAPS
300.0000 mg | ORAL_CAPSULE | Freq: Two times a day (BID) | ORAL | Status: DC
  Administered 2021-10-12 – 2021-10-13 (×2): 300 mg via ORAL
  Filled 2021-10-12 (×2): qty 1

## 2021-10-12 MED ORDER — SODIUM CHLORIDE 0.9 % IV SOLN
INTRAVENOUS | Status: AC
  Filled 2021-10-12: qty 2

## 2021-10-12 MED ORDER — ASCORBIC ACID 500 MG PO TABS
500.0000 mg | ORAL_TABLET | Freq: Every day | ORAL | Status: DC
  Administered 2021-10-12 – 2021-10-13 (×2): 500 mg via ORAL
  Filled 2021-10-12 (×2): qty 1

## 2021-10-12 MED ORDER — HEPARIN (PORCINE) IN NACL 1000-0.9 UT/500ML-% IV SOLN
INTRAVENOUS | Status: DC | PRN
  Administered 2021-10-12: 500 mL

## 2021-10-12 MED ORDER — MAGNESIUM OXIDE -MG SUPPLEMENT 400 (240 MG) MG PO TABS
400.0000 mg | ORAL_TABLET | Freq: Every day | ORAL | Status: DC
Start: 2021-10-12 — End: 2021-10-13
  Administered 2021-10-12 – 2021-10-13 (×2): 400 mg via ORAL
  Filled 2021-10-12 (×2): qty 1

## 2021-10-12 MED ORDER — CHLORHEXIDINE GLUCONATE 4 % EX LIQD: 4.0000 | Freq: Once | CUTANEOUS | Status: DC

## 2021-10-12 MED ORDER — VITAMIN B-12 1000 MCG PO TABS
1000.0000 ug | ORAL_TABLET | Freq: Every day | ORAL | Status: DC
  Administered 2021-10-13: 1000 ug via ORAL
  Filled 2021-10-12: qty 1

## 2021-10-12 MED ORDER — VITAMIN B-6 100 MG PO TABS
100.0000 mg | ORAL_TABLET | Freq: Every day | ORAL | Status: DC
  Administered 2021-10-13: 100 mg via ORAL
  Filled 2021-10-12: qty 1

## 2021-10-12 MED ORDER — LIDOCAINE HCL 1 % IJ SOLN
INTRAMUSCULAR | Status: AC
  Filled 2021-10-12: qty 60

## 2021-10-12 MED ORDER — MIDAZOLAM HCL 5 MG/5ML IJ SOLN
INTRAMUSCULAR | Status: AC
  Filled 2021-10-12: qty 5

## 2021-10-12 MED ORDER — CEFAZOLIN SODIUM-DEXTROSE 2-4 GM/100ML-% IV SOLN
INTRAVENOUS | Status: AC
  Filled 2021-10-12: qty 100

## 2021-10-12 MED ORDER — DOFETILIDE 125 MCG PO CAPS
125.0000 ug | ORAL_CAPSULE | Freq: Two times a day (BID) | ORAL | Status: DC
  Administered 2021-10-12 – 2021-10-13 (×2): 125 ug via ORAL
  Filled 2021-10-12 (×2): qty 1

## 2021-10-12 MED ORDER — PANTOPRAZOLE SODIUM 20 MG PO TBEC
20.0000 mg | DELAYED_RELEASE_TABLET | Freq: Every day | ORAL | Status: DC
  Administered 2021-10-13: 20 mg via ORAL
  Filled 2021-10-12: qty 1

## 2021-10-12 MED ORDER — POTASSIUM CHLORIDE CRYS ER 20 MEQ PO TBCR
40.0000 meq | EXTENDED_RELEASE_TABLET | Freq: Every day | ORAL | Status: DC
Start: 2021-10-12 — End: 2021-10-13
  Administered 2021-10-12 – 2021-10-13 (×2): 40 meq via ORAL
  Filled 2021-10-12 (×2): qty 2

## 2021-10-12 MED ORDER — VITAMIN D 25 MCG (1000 UNIT) PO TABS
2000.0000 [IU] | ORAL_TABLET | Freq: Every day | ORAL | Status: DC
  Administered 2021-10-13: 2000 [IU] via ORAL
  Filled 2021-10-12: qty 2

## 2021-10-12 MED ORDER — LIDOCAINE HCL (PF) 1 % IJ SOLN
INTRAMUSCULAR | Status: DC | PRN
  Administered 2021-10-12: 60 mL

## 2021-10-12 MED ORDER — APIXABAN 5 MG PO TABS
5.0000 mg | ORAL_TABLET | Freq: Two times a day (BID) | ORAL | Status: DC
Start: 2021-10-12 — End: 2021-10-12

## 2021-10-12 MED ORDER — FENTANYL CITRATE (PF) 100 MCG/2ML IJ SOLN
INTRAMUSCULAR | Status: DC | PRN
  Administered 2021-10-12: 50 ug via INTRAVENOUS

## 2021-10-12 MED ORDER — VITAMIN E 45 MG (100 UNIT) PO CAPS
400.0000 [IU] | ORAL_CAPSULE | Freq: Every day | ORAL | Status: DC
  Administered 2021-10-13: 400 [IU] via ORAL
  Filled 2021-10-12: qty 4

## 2021-10-12 MED ORDER — CEFAZOLIN SODIUM-DEXTROSE 2-4 GM/100ML-% IV SOLN
2.0000 g | INTRAVENOUS | Status: AC
  Administered 2021-10-12: 2 g via INTRAVENOUS

## 2021-10-12 MED ORDER — MIDAZOLAM HCL 5 MG/5ML IJ SOLN
INTRAMUSCULAR | Status: DC | PRN
  Administered 2021-10-12: 2 mg via INTRAVENOUS

## 2021-10-12 MED ORDER — ACETAMINOPHEN 500 MG PO TABS
1000.0000 mg | ORAL_TABLET | Freq: Four times a day (QID) | ORAL | Status: DC | PRN
  Administered 2021-10-12 – 2021-10-13 (×2): 1000 mg via ORAL
  Filled 2021-10-12 (×2): qty 2

## 2021-10-12 MED ORDER — ACETAMINOPHEN 325 MG PO TABS: 325.0000 mg | ORAL_TABLET | ORAL | Status: DC | PRN

## 2021-10-12 MED ORDER — ROSUVASTATIN CALCIUM 20 MG PO TABS
40.0000 mg | ORAL_TABLET | Freq: Every day | ORAL | Status: DC
  Administered 2021-10-12: 40 mg via ORAL
  Filled 2021-10-12: qty 2

## 2021-10-12 SURGICAL SUPPLY — 12 items
CABLE SURGICAL S-101-97-12 (CABLE) ×2 IMPLANT
CATH RIGHTSITE C315HIS02 (CATHETERS) ×1 IMPLANT
IPG PACE AZUR XT DR MRI W1DR01 (Pacemaker) IMPLANT
LEAD CAPSURE NOVUS 5076-52CM (Lead) ×1 IMPLANT
LEAD SELECT SECURE 3830 383069 (Lead) IMPLANT
PACE AZURE XT DR MRI W1DR01 (Pacemaker) ×2 IMPLANT
PAD DEFIB RADIO PHYSIO CONN (PAD) ×2 IMPLANT
SELECT SECURE 3830 383069 (Lead) ×2 IMPLANT
SHEATH 7FR PRELUDE SNAP 13 (SHEATH) ×2 IMPLANT
SLITTER 6232ADJ (MISCELLANEOUS) ×1 IMPLANT
TRAY PACEMAKER INSERTION (PACKS) ×2 IMPLANT
WIRE HI TORQ VERSACORE-J 145CM (WIRE) ×1 IMPLANT

## 2021-10-12 NOTE — H&P (Signed)
HPI [name] returns today for followup. She is a pleasant 86 yo woman with a h/o PAF who has been maintained on dofetilide. She lost almost 10 lbs. She has had rare palpitations. She has class 2 dyspnea. She denies syncope but she does have weak spells which correlate with her bradycardia. Her HR is running slow.         Allergies  Allergen Reactions   Fenofibrate Other (See Comments)      elevated LFTs   Ondansetron Other (See Comments)      drug interaction              Current Outpatient Medications  Medication Sig Dispense Refill   acetaminophen (TYLENOL) 650 MG CR tablet Take 1,300 mg by mouth every 8 (eight) hours as needed for pain.       Ascorbic Acid (VITAMIN C) 1000 MG tablet Take 1,000 mg by mouth daily.        BIOTIN PO Take 10,000 Units by mouth daily with breakfast.        Cholecalciferol (VITAMIN D-3) 1000 UNITS CAPS Take 1,000 Units by mouth daily with breakfast.        cyanocobalamin 1000 MCG tablet Take 1,000 mcg by mouth daily.        dofetilide (TIKOSYN) 125 MCG capsule TAKE ONE CAPSULE BY MOUTH TWICE A DAY 60 capsule 11   ELIQUIS 5 MG TABS tablet Take 1 tablet (5 mg total) by mouth 2 (two) times daily. 180 tablet 1   ferrous sulfate 325 (65 FE) MG tablet Take 325 mg by mouth daily with breakfast.       furosemide (LASIX) 40 MG tablet Take 1 tablet (40 mg total) by mouth daily. 90 tablet 1   gabapentin (NEURONTIN) 300 MG capsule Take 300 mg by mouth 2 (two) times daily.       hydrALAZINE (APRESOLINE) 25 MG tablet Take 1 tablet (25 mg total) by mouth in the morning and at bedtime. 180 tablet 3   iron polysaccharides (NIFEREX) 150 MG capsule Take 1 capsule by mouth 2 (two) times daily.       LORazepam (ATIVAN) 1 MG tablet Take 1 mg by mouth at bedtime.       magnesium oxide (MAG-OX) 400 (240 Mg) MG tablet Take 1 tablet by mouth daily.       magnesium oxide (MAG-OX) 400 MG tablet Take 1 tablet (400 mg total) by mouth daily. 90 tablet 3   meclizine  (ANTIVERT) 12.5 MG tablet Take 1 tablet (12.5 mg total) by mouth daily as needed for dizziness. 30 tablet 0   Melatonin 10 MG TABS Take 10 mg by mouth See admin instructions.       Multiple Vitamin (MULTI VITAMIN) TABS daily at 6 (six) AM.       Omega-3 Fatty Acids (OMEGA 3 500 PO) Take 1 tablet by mouth daily.       OVER THE COUNTER MEDICATION Take 6 capsules by mouth daily. Balance of nature 3 capsules of fruit and 3 capsules of vegetables       pantoprazole (PROTONIX) 20 MG tablet daily at 6 (six) AM.       potassium chloride (KLOR-CON) 10 MEQ tablet Take 4 tablets (40 mEq total) by mouth daily. 120 tablet 6   Probiotic Product (CVS PROBIOTIC) CHEW daily at 6 (six) AM.       pyridOXINE (VITAMIN B-6) 100 MG tablet Take 100 mg by mouth daily.  rosuvastatin (CRESTOR) 40 MG tablet Take 1 tablet (40 mg total) by mouth daily at 6 PM. 30 tablet 6   valsartan (DIOVAN) 320 MG tablet Take 1 tablet (320 mg total) by mouth daily. 90 tablet 3   vitamin E 400 UNIT capsule Take 400 Units by mouth daily.        No current facility-administered medications for this visit.            Past Medical History:  Diagnosis Date   Arthritis     Atrial fibrillation (HCC)     Breast cancer ([place])     Cancer ([place])      Breast- rt   Cough with sputum 2016   Elevated liver function tests 05/09/2013   GERD (gastroesophageal reflux disease)      COSTOCHONDRITIS   Heart murmur     Hip pain 05/08/2014   Hyperlipidemia     Hypertension     Lipoma      back of neck   PONV (postoperative nausea and vomiting)      only after rt hip surgery   Primary osteoarthritis of left knee 11/04/2015   Primary osteoarthritis of right hip 07/30/2014   Squamous cell carcinoma of skin 08/21/2019    atypical squa. proliferation-Left corner of mouth      ROS:    All systems reviewed and negative except as noted in the HPI.          Past Surgical History:  Procedure Laterality Date   ANKLE FRACTURE SURGERY Right 2013     RIGHT    BREAST LUMPECTOMY Right 2011   BREAST SURGERY   04/22/2009    Rt lumpectomy   CARDIOVERSION N/A 02/19/2019    Procedure: CARDIOVERSION;  Surgeon: Dorothy Spark, MD;  Location: [facility];  Service: Cardiovascular;  Laterality: N/A;   CORONARY ATHERECTOMY N/A 04/30/2019    Procedure: CORONARY ATHERECTOMY;  Surgeon: Belva Crome, MD;  Location: [facility];  Service: Cardiovascular;  Laterality: N/A;  LAD   CORONARY STENT INTERVENTION N/A 04/30/2019    Procedure: CORONARY STENT INTERVENTION;  Surgeon: Belva Crome, MD;  Location: [facility];  Service: Cardiovascular;  Laterality: N/A;  DES TO PROX-MID LAD   EYE SURGERY   2001    macular hole   JOINT REPLACEMENT   2016   KNEE SURGERY       LEFT HEART CATH AND CORONARY ANGIOGRAPHY N/A 04/30/2019    Procedure: LEFT HEART CATH AND CORONARY ANGIOGRAPHY;  Surgeon: Belva Crome, MD;  Location: [facility];  Service: Cardiovascular;  Laterality: N/A;   LEFT HEART CATH AND CORONARY ANGIOGRAPHY N/A 12/08/2020    Procedure: LEFT HEART CATH AND CORONARY ANGIOGRAPHY;  Surgeon: Troy Sine, MD;  Location: [facility];  Service: Cardiovascular;  Laterality: N/A;   MIDDLE EAR SURGERY Left 08/18/2015    repair eardrum and reconstruction   OVARY SURGERY   40 years ago    wedge   TOTAL HIP ARTHROPLASTY Right 07/30/2014    Procedure: TOTAL HIP ARTHROPLASTY ANTERIOR APPROACH;  Surgeon: Melrose Nakayama, MD;  Location: [facility];  Service: Orthopedics;  Laterality: Right;   TOTAL KNEE ARTHROPLASTY Left 11/04/2015   TOTAL KNEE ARTHROPLASTY Left 11/04/2015    Procedure: TOTAL KNEE ARTHROPLASTY;  Surgeon: Melrose Nakayama, MD;  Location: [facility];  Service: Orthopedics;  Laterality: Left;   TYMPANOPLASTY   30 years ago             Family  History  Problem Relation Age of Onset   Heart disease Mother     Cancer Brother          colon, pancreatic, brain        Social History         Socioeconomic History   Marital  status: Widowed      Spouse name: Not on file   Number of children: Not on file   Years of education: Not on file   Highest education level: Not on file  Occupational History   Not on file  Tobacco Use   Smoking status: Never   Smokeless tobacco: Never  Vaping Use   Vaping Use: Never used  Substance and Sexual Activity   Alcohol use: Not Currently   Drug use: No   Sexual activity: Not Currently      Birth control/protection: None  Other Topics Concern   Not on file  Social History Narrative   Not on file    Social Determinants of Health    Financial Resource Strain: Not on file  Food Insecurity: Not on file  Transportation Needs: Not on file  Physical Activity: Not on file  Stress: Not on file  Social Connections: Not on file  Intimate Partner Violence: Not on file        BP (!) 138/58   Pulse (!) 48   Ht 5' 4.5" (1.638 m)   Wt 183 lb (83 kg)   SpO2 98%   BMI 30.93 kg/m    Physical Exam:   Well appearing 86 yo woman, NAD HEENT: Unremarkable Neck:  No JVD, no thyromegally Lymphatics:  No adenopathy Back:  No CVA tenderness Lungs:  Clear with wheezes HEART:  Regular rate rhythm, no murmurs, no rubs, no clicks Abd:  soft, positive bowel sounds, no organomegally, no rebound, no guarding Ext:  2 plus pulses, no edema, no cyanosis, no clubbing Skin:  No rashes no nodules Neuro:  CN II through XII intact, motor grossly intact   DEVICE  Normal device function.  See PaceArt for details. She is in rhythm but slow in the 40's.    Assess/Plan:    PAF - she is maintaining NSR. She will continue dofetilide. Sinus node dysfunction - the patient has had episodes of HR's in the low 40's. I think that she has become symptomatic. I discussed the risks/benefits/goals/expectations and she wishes to proceed HTN - her bp is minimally elevated. We will follow. No change in her meds today. I asked her to take additional hydralazine if her bp was above 170.  Syncope - no pauses.  No symptoms. We will follow.   Carleene Overlie Lekeith Wulf,MD

## 2021-10-12 NOTE — Care Management (Addendum)
  Transition of Care [facility]) Screening Note   Patient Details  Name: Sheila Gardner Date of Birth: 1936-02-02   Transition of [facility] CM/SW Contact:    Bethena Roys, RN Phone Number: 10/12/2021, 2:38 PM    Transition of Care Department [facility]) has reviewed the patient. Patient presented for HR in the 40's- per notes will continue Tikosyn. Case Manager received benefits check for Tikosyn-benefits check submitted. Case Manager will discuss cost and pharmacy of choice as the patient progresses.    1445 10-12-21 Benefits check submitted for cost- patient current cost at [employer] is $43.67 .

## 2021-10-12 NOTE — Telephone Encounter (Signed)
Pharmacy Patient Advocate Encounter  Insurance verification completed.    The patient is insured through [employer]   The patient is currently admitted and ran test claims for the following: Tikosyn.  Copays and coinsurance results were relayed to Inpatient clinical team.

## 2021-10-12 NOTE — TOC Benefit Eligibility Note (Signed)
Patient Advocate Encounter  This patient is insured through [employer]   The patient is currently admitted and upon discharge could be taking Tikosyn with a 30 day co-pay of $43.37.  Clista Bernhardt, CPhT Rx Patient Advocate Specialist Phone: [phone]

## 2021-10-13 ENCOUNTER — Encounter (HOSPITAL_COMMUNITY): Payer: Self-pay | Admitting: Internal Medicine

## 2021-10-13 ENCOUNTER — Ambulatory Visit (HOSPITAL_COMMUNITY): Payer: PPO

## 2021-10-13 DIAGNOSIS — Z950 Presence of cardiac pacemaker: Secondary | ICD-10-CM | POA: Diagnosis not present

## 2021-10-13 DIAGNOSIS — M47814 Spondylosis without myelopathy or radiculopathy, thoracic region: Secondary | ICD-10-CM | POA: Diagnosis not present

## 2021-10-13 DIAGNOSIS — I517 Cardiomegaly: Secondary | ICD-10-CM | POA: Diagnosis not present

## 2021-10-13 DIAGNOSIS — I10 Essential (primary) hypertension: Secondary | ICD-10-CM | POA: Diagnosis not present

## 2021-10-13 DIAGNOSIS — R55 Syncope and collapse: Secondary | ICD-10-CM | POA: Diagnosis not present

## 2021-10-13 DIAGNOSIS — R0600 Dyspnea, unspecified: Secondary | ICD-10-CM | POA: Diagnosis not present

## 2021-10-13 DIAGNOSIS — I480 Paroxysmal atrial fibrillation: Secondary | ICD-10-CM | POA: Diagnosis not present

## 2021-10-13 DIAGNOSIS — I700 Atherosclerosis of aorta: Secondary | ICD-10-CM | POA: Diagnosis not present

## 2021-10-13 DIAGNOSIS — R2689 Other abnormalities of gait and mobility: Secondary | ICD-10-CM | POA: Diagnosis not present

## 2021-10-13 DIAGNOSIS — I495 Sick sinus syndrome: Secondary | ICD-10-CM | POA: Diagnosis not present

## 2021-10-13 DIAGNOSIS — Z7901 Long term (current) use of anticoagulants: Secondary | ICD-10-CM | POA: Diagnosis not present

## 2021-10-13 LAB — BASIC METABOLIC PANEL
Anion gap: 9 (ref 5–15)
BUN: 17 mg/dL (ref 8–23)
CO2: 25 mmol/L (ref 22–32)
Calcium: 9.3 mg/dL (ref 8.9–10.3)
Chloride: 99 mmol/L (ref 98–111)
Creatinine, Ser: 0.64 mg/dL (ref 0.44–1.00)
GFR, Estimated: >60 mL/min (ref >60)
Glucose, Bld: 117 mg/dL — ABNORMAL HIGH (ref 70–99)
Potassium: 3.7 mmol/L (ref 3.5–5.1)
Sodium: 133 mmol/L — ABNORMAL LOW (ref 135–145)

## 2021-10-13 LAB — MAGNESIUM: Magnesium: 2.1 mg/dL (ref 1.7–2.4)

## 2021-10-13 MED ORDER — ELIQUIS 5 MG PO TABS: 5.0000 mg | ORAL_TABLET | Freq: Two times a day (BID) | ORAL | 1 refills | Status: DC

## 2021-10-13 MED ORDER — ACETAMINOPHEN 325 MG PO TABS: 325.0000 mg | ORAL_TABLET | ORAL | Status: DC | PRN

## 2021-10-13 MED ORDER — POTASSIUM CHLORIDE CRYS ER 20 MEQ PO TBCR
40.0000 meq | EXTENDED_RELEASE_TABLET | Freq: Once | ORAL | Status: DC
  Filled 2021-10-13: qty 2

## 2021-10-13 NOTE — Discharge Summary (Addendum)
ELECTROPHYSIOLOGY PROCEDURE DISCHARGE SUMMARY    Patient ID: Sheila Gardner,  MRN: [id number], DOB/AGE: 84-Jan-1937 84 y.o.  Admit date: 10/12/2021 Discharge date: 10/13/2021  Primary Care Physician: Lawerance Cruel, MD  Primary Cardiologist: Sinclair Grooms, MD  Electrophysiologist: Cristopher Peru, MD   Primary Discharge Diagnosis:  Symptomatic bradycardia status post pacemaker implantation this admission  Secondary Discharge Diagnosis:  SND  Allergies  Allergen Reactions   Fenofibrate Other (See Comments)    elevated LFTs   Ondansetron Other (See Comments)    drug interaction     Procedures This Admission:  1.  Implantation of a Medtronic Dual Chamber PPM on 10/12/2021 by Dr. Lovena Le. The patient received a Medtronic Azure XT DR [id number]  with a Medtronic CapSureFix Novus 5076 right atrial lead and a Medtronic SelectSure 1497 right ventricular lead. There were no immediate post procedure complications.   2.  CXR on 10/13/21 demonstrated no pneumothorax status post device implantation.    Brief HPI: Sheila Gardner is a 86 y.o. female was followed by electrophysiology in the outpatient setting for AF. Noted to have periods of symptomatic bradycardia on her loop recorder. Seen for consideration of PPM implantation.  Past medical history includes above.  The patient has had symptomatic bradycardia without reversible causes identified, and requires medications to control her atrial fibrillation.  Risks, benefits, and alternatives to PPM implantation were reviewed with the patient who wished to proceed.   Hospital Course:  The patient was admitted and underwent implantation of a Medtronic dual chamber PPM with details as outlined above.  She was monitored on telemetry overnight which demonstrated appropriate pacing.  Left chest was without hematoma or ecchymosis.  The device was interrogated and found to be functioning normally.  CXR was obtained and demonstrated no pneumothorax  status post device implantation.  Wound care, arm mobility, and restrictions were reviewed with the patient.  The patient was examined and considered stable for discharge to home.    Anticoagulation resumption This patient should resume their Eliquis on Saturday October 17, 2021    Physical Exam: Vitals:   10/12/21 2120 10/12/21 2336 10/13/21 0441 10/13/21 0804  BP: (!) 148/53 (!) 141/67 135/63 (!) 120/56  Pulse: 70 70 69 70  Resp:  '16 17 17  '$ Temp:  98.3 F (36.8 C) (!) 97.4 F (36.3 C) 97.6 F (36.4 C)  TempSrc:  Oral Oral Oral  SpO2: 97% 96%  100%  Weight:      Height:        GEN- The patient is well appearing, alert and oriented x 3 today.   HEENT: normocephalic, atraumatic; sclera clear, conjunctiva pink; hearing intact; oropharynx clear; neck supple, no JVP Lymph- no cervical lymphadenopathy Lungs- Clear to ausculation bilaterally, normal work of breathing.  No wheezes, rales, rhonchi Heart- Regular rate and rhythm, no murmurs, rubs or gallops, PMI not laterally displaced GI- soft, non-tender, non-distended, bowel sounds present, no hepatosplenomegaly Extremities- no clubbing, cyanosis, or edema; DP/PT/radial pulses 2+ bilaterally MS- no significant deformity or atrophy Skin- warm and dry, no rash or lesion, left chest without hematoma/ecchymosis Psych- euthymic mood, full affect Neuro- strength and sensation are intact   Labs:   Lab Results  Component Value Date   WBC 6.8 09/21/2021   HGB 11.4 09/21/2021   HCT 34.7 09/21/2021   MCV 94 09/21/2021   PLT 267 09/21/2021    Recent Labs  Lab 10/13/21 0421  NA 133*  K 3.7  CL 99  CO2  25  BUN 17  CREATININE 0.64  CALCIUM 9.3  GLUCOSE 117*    Discharge Medications:  Allergies as of 10/13/2021       Reactions   Fenofibrate Other (See Comments)   elevated LFTs   Ondansetron Other (See Comments)   drug interaction        Medication List     TAKE these medications    acetaminophen 500 MG  tablet Commonly known as: TYLENOL Take 1,000 mg by mouth every 6 (six) hours as needed for moderate pain or mild pain. What changed: Another medication with the same name was added. Make sure you understand how and when to take each.   acetaminophen 325 MG tablet Commonly known as: TYLENOL Take 1-2 tablets (325-650 mg total) by mouth every 4 (four) hours as needed for mild pain. What changed: You were already taking a medication with the same name, and this prescription was added. Make sure you understand how and when to take each.   ascorbic acid 500 MG tablet Commonly known as: VITAMIN C Take 500 mg by mouth daily.   Biotin 5000 MCG Tabs Take 5,000 Units by mouth daily with breakfast.   CVS Probiotic Chew Chew 1 tablet by mouth daily at 6 (six) AM.   cyanocobalamin 1000 MCG tablet Take 1,000 mcg by mouth daily.   dofetilide 125 MCG capsule Commonly known as: TIKOSYN Take 1 capsule (125 mcg total) by mouth 2 (two) times daily.   Eliquis 5 MG Tabs tablet Generic drug: apixaban Take 1 tablet (5 mg total) by mouth 2 (two) times daily. Start taking on: October 17, 2021 What changed: These instructions start on October 17, 2021. If you are unsure what to do until then, ask your doctor or other care provider.   ferrous sulfate 325 (65 FE) MG tablet Take 325 mg by mouth daily with breakfast.   furosemide 40 MG tablet Commonly known as: LASIX Take 1 tablet (40 mg total) by mouth daily.   gabapentin 300 MG capsule Commonly known as: NEURONTIN Take 300 mg by mouth 2 (two) times daily.   hydrALAZINE 25 MG tablet Commonly known as: APRESOLINE Take 1 tablet (25 mg total) by mouth in the morning and at bedtime.   hydrocortisone cream 1 % Apply 1 Application topically daily as needed for itching (irritation). Ultra Moisturizing   iron polysaccharides 150 MG capsule Commonly known as: NIFEREX Take 150 mg by mouth 2 (two) times daily.   lidocaine 4 % cream Commonly known as:  LMX Apply 1 Application topically daily as needed (leg pain).   LORazepam 1 MG tablet Commonly known as: ATIVAN Take 1 mg by mouth 2 (two) times daily as needed for anxiety.   magnesium oxide 400 MG tablet Commonly known as: MAG-OX Take 1 tablet (400 mg total) by mouth daily.   meclizine 12.5 MG tablet Commonly known as: ANTIVERT Take 1 tablet (12.5 mg total) by mouth daily as needed for dizziness.   Melatonin 10 MG Tabs Take 10 mg by mouth at bedtime as needed (Sleep).   OMEGA 3 500 PO Take 1 tablet by mouth daily. Multi omega 3   OVER THE COUNTER MEDICATION Take 1 tablet by mouth daily. Health brain all day focus   pantoprazole 20 MG tablet Commonly known as: PROTONIX Take 20 mg by mouth daily at 6 (six) AM.   potassium chloride 10 MEQ tablet Commonly known as: KLOR-CON M Take 4 tablets (40 mEq total) by mouth daily.   pyridOXINE 100 MG  tablet Commonly known as: VITAMIN B6 Take 100 mg by mouth daily.   rosuvastatin 40 MG tablet Commonly known as: CRESTOR Take 1 tablet (40 mg total) by mouth daily at 6 PM.   tetrahydrozoline 0.05 % ophthalmic solution Place 1 drop into both eyes daily as needed (dry eyes).   valsartan 320 MG tablet Commonly known as: DIOVAN Take 1 tablet (320 mg total) by mouth daily. What changed: when to take this   Vitamin D-3 25 MCG (1000 UT) Caps Take 2,000 Units by mouth daily with breakfast.   vitamin E 180 MG (400 UNITS) capsule Take 400 Units by mouth daily.   Voltaren 1 % Gel Generic drug: diclofenac Sodium Apply 2 g topically daily as needed (pain).        Disposition:    Follow-up Information     [facility] Follow up.   Why: on 8/17 at noon for post pacemaker follow up Contact information: [place] [phone]                Duration of Discharge Encounter: Greater than 30 minutes including physician  time.  Jacalyn Lefevre, PA-C  10/13/2021 8:47 AM  EP Attending  Patient seen and examined. Agree with the findings as noted above. The patient is doing well after undergoing DDD PM insertion. She feel well this morning. Interrogation of her DDD PM under my direction demonstrates normal DDD PM function. Her CXR demonstrates good lead position and no PTX. She will be discharged home with usual followup.  Carleene Overlie Ivyonna Hoelzel,MD

## 2021-10-13 NOTE — Discharge Instructions (Addendum)
After Your Pacemaker   You have a Medtronic Pacemaker  ACTIVITY Do not lift your arm above shoulder height for 1 week after your procedure. After 7 days, you may progress as below.  You should remove your sling 24 hours after your procedure, unless otherwise instructed by your provider.     Tuesday October 20, 2021  Wednesday October 21, 2021 Thursday October 22, 2021 Friday October 23, 2021   Do not lift, push, pull, or carry anything over 10 pounds with the affected arm until 6 weeks (Tuesday November 24, 2021 ) after your procedure.   You may drive AFTER your wound check, unless you have been told otherwise by your provider.   Ask your healthcare provider when you can go back to work   INCISION/Dressing If you are on a blood thinner such as Coumadin, Xarelto, Eliquis, Plavix, or Pradaxa please confirm with your provider when this should be resumed.   If large square, outer bandage is left in place, this can be removed after 24 hours from your procedure. Do not remove steri-strips or glue as below.   Monitor your Pacemaker site for redness, swelling, and drainage. Call the device clinic at [phone] if you experience these symptoms or fever/chills.  If your incision is sealed with Steri-strips or staples, you may shower 7 days after your procedure or when told by your provider. Do not remove the steri-strips or let the shower hit directly on your site. You may wash around your site with soap and water.    If you were discharged in a sling, please do not wear this during the day more than 48 hours after your surgery unless otherwise instructed. This may increase the risk of stiffness and soreness in your shoulder.   Avoid lotions, ointments, or perfumes over your incision until it is well-healed.  You may use a hot tub or a pool AFTER your wound check appointment if the incision is completely closed.  Pacemaker Alerts:  Some alerts are vibratory and others beep. These are NOT  emergencies. Please call our office to let us know. If this occurs at night or on weekends, it can wait until the next business day. Send a remote transmission.  If your device is capable of reading fluid status (for heart failure), you will be offered monthly monitoring to review this with you.   DEVICE MANAGEMENT Remote monitoring is used to monitor your pacemaker from home. This monitoring is scheduled every 91 days by our office. It allows [place] to keep an eye on the functioning of your device to ensure it is working properly. You will routinely see your Electrophysiologist annually (more often if necessary).   You should receive your ID card for your new device in 4-8 weeks. Keep this card with you at all times once received. Consider wearing a medical alert bracelet or necklace.  Your Pacemaker may be MRI compatible. This will be discussed at your next office visit/wound check.  You should avoid contact with strong electric or magnetic fields.   Do not use amateur (ham) radio equipment or electric (arc) welding torches. MP3 player headphones with magnets should not be used. Some devices are safe to use if held at least 12 inches (30 cm) from your Pacemaker. These include power tools, lawn mowers, and speakers. If you are unsure if something is safe to use, ask your health care provider.  When using your cell phone, hold it to the ear that is on the opposite side from the  Pacemaker. Do not leave your cell phone in a pocket over the Pacemaker.  You may safely use electric blankets, heating pads, computers, and microwave ovens.  Call the office right away if: You have chest pain. You feel more short of breath than you have felt before. You feel more light-headed than you have felt before. Your incision starts to open up.  This information is not intended to replace advice given to you by your health care provider. Make sure you discuss any questions you have with your health care provider.

## 2021-10-13 NOTE — Evaluation (Signed)
Physical Therapy Evaluation Patient Details Name: Sheila Gardner MRN: [id number] DOB: 1935/05/07 Today's Date: 10/13/2021  History of Present Illness  Pt is an 86 y/o female s/p pacemaker placement on 8/7. PMH includes breast cancer, a fib, HTN, L TKA, and R THA.  Clinical Impression  Pt admitted secondary to problem above with deficits below. Pt overall at a mod I level with mobility tasks. Reviewed pacemaker precautions and gave handout. Pt maintained appropriately throughout. Pt reports family can assist as needed at d/c. No further skilled PT needs at this time. Will sign off. If needs change, please re-consult.         Recommendations for follow up therapy are one component of a multi-disciplinary discharge planning process, led by the attending physician.  Recommendations may be updated based on patient status, additional functional criteria and insurance authorization.  Follow Up Recommendations No PT follow up      Assistance Recommended at Discharge Intermittent Supervision/Assistance  Patient can return home with the following  A little help with bathing/dressing/bathroom;Assistance with cooking/housework;Assist for transportation    Equipment Recommendations None recommended by PT  Recommendations for Other Services       Functional Status Assessment Patient has had a recent decline in their functional status and demonstrates the ability to make significant improvements in function in a reasonable and predictable amount of time.     Precautions / Restrictions Precautions Precautions: Fall;ICD/Pacemaker Precaution Comments: Reviewed pacemaker precaution handout Restrictions Weight Bearing Restrictions: Yes LUE Weight Bearing:  (pacemaker)      Mobility  Bed Mobility Overal bed mobility: Modified Independent                  Transfers Overall transfer level: Modified independent                      Ambulation/Gait Ambulation/Gait assistance:  Modified independent (Device/Increase time) Gait Distance (Feet): 250 Feet Assistive device: None Gait Pattern/deviations: Step-through pattern Gait velocity: Decreased     General Gait Details: Mildly slower. Overall steady. No LOB noted.  Stairs            Wheelchair Mobility    Modified Rankin (Stroke Patients Only)       Balance Overall balance assessment: No apparent balance deficits (not formally assessed)                                           Pertinent Vitals/Pain Pain Assessment Pain Assessment: Faces Faces Pain Scale: Hurts little more Pain Location: incision site Pain Descriptors / Indicators: Burning Pain Intervention(s): Limited activity within patient's tolerance, Monitored during session, Repositioned    Home Living Family/patient expects to be discharged to:: Private residence Living Arrangements: Alone Available Help at Discharge: Family Type of Home: Apartment Home Access: Level entry       Home Layout: One level Home Equipment: Shower seat      Prior Function Prior Level of Function : Independent/Modified Independent                     Hand Dominance        Extremity/Trunk Assessment   Upper Extremity Assessment Upper Extremity Assessment: LUE deficits/detail LUE Deficits / Details: maintained pacemaker precautions throughout    Lower Extremity Assessment Lower Extremity Assessment: Overall WFL for tasks assessed    Cervical / Trunk Assessment Cervical / Trunk Assessment:  Normal  Communication   Communication: No difficulties  Cognition Arousal/Alertness: Awake/alert Behavior During Therapy: WFL for tasks assessed/performed Overall Cognitive Status: Within Functional Limits for tasks assessed                                          General Comments General comments (skin integrity, edema, etc.): Reviewed pacemaker precautions with pt and how to perform ADLs.    Exercises      Assessment/Plan    PT Assessment Patient does not need any further PT services  PT Problem List         PT Treatment Interventions      PT Goals (Current goals can be found in the Care Plan section)  Acute Rehab PT Goals Patient Stated Goal: to go home PT Goal Formulation: With patient Time For Goal Achievement: 10/13/21 Potential to Achieve Goals: Good    Frequency       Co-evaluation               AM-PAC PT "6 Clicks" Mobility  Outcome Measure Help needed turning from your back to your side while in a flat bed without using bedrails?: None Help needed moving from lying on your back to sitting on the side of a flat bed without using bedrails?: None Help needed moving to and from a bed to a chair (including a wheelchair)?: None Help needed standing up from a chair using your arms (e.g., wheelchair or bedside chair)?: None Help needed to walk in hospital room?: None Help needed climbing 3-5 steps with a railing? : A Little 6 Click Score: 23    End of Session   Activity Tolerance: Patient tolerated treatment well Patient left: in chair;with call bell/phone within reach Nurse Communication: Mobility status PT Visit Diagnosis: Other abnormalities of gait and mobility (R26.89)    Time: 2409-7353 PT Time Calculation (min) (ACUTE ONLY): 17 min   Charges:   PT Evaluation $PT Eval Low Complexity: 1 Low          Lou Miner, DPT  Acute Rehabilitation Services  Office: [phone]   Rudean Hitt 10/13/2021, 9:44 AM

## 2021-10-15 ENCOUNTER — Telehealth: Payer: Self-pay | Admitting: Internal Medicine

## 2021-10-15 LAB — CUP PACEART REMOTE DEVICE CHECK
Date Time Interrogation Session: 20230808231410
Implantable Pulse Generator Implant Date: 20211109

## 2021-10-15 NOTE — Telephone Encounter (Signed)
Patient is calling c/o her head feeling heavy as well as feeling bad since her surgery. Her BP today is 120/68 HR 70 and she is denying any pain, but states she felt better with her HR in the 40's than she does now with it in the 70's. Patient is wanting to know if this is just an adjustment period from the procedure  or something she should be concerned with. Please advise.

## 2021-10-15 NOTE — Telephone Encounter (Signed)
Patient reports the only complaint she has is her head is feeling heavy. States when she wakes up in the morning she feels great, eats something for breakfast then shortly after feels like she needs to lay down for majority of the day.   Currently unable to send a transmission on the app but is working with tech support on this.   Dr. Lovena Le is currently on out the office so will forward to Dr. Quentin Ore and Dr. Curt Bears who is covering in the hospital today/tomorrow.

## 2021-10-16 ENCOUNTER — Ambulatory Visit: Payer: Self-pay

## 2021-10-16 NOTE — Telephone Encounter (Signed)
Outreach made to Pt.  Provided reassurance that she is recovering from a procedure.  Advised having a new pacemaker was not making her tired and or causing a headache.  Encouraged Pt to relax and listen to her body for the next few days.  Nap and take tylenol as needed.  Reassured Pt she will feel better soon.  She states she already feels better today.  She also is going to switch from the phone app to a bedside monitor.  Added this change to her wound check appt.  She will call with further needs.

## 2021-10-20 NOTE — Telephone Encounter (Signed)
Currently waiting on transmission

## 2021-10-20 NOTE — Telephone Encounter (Signed)
I called the patient to help her with her monitor. Monitor is now set up. There is a delay in Medtronic system with the transmission. Just got to keep checking for the transmissions.

## 2021-10-22 ENCOUNTER — Ambulatory Visit (INDEPENDENT_AMBULATORY_CARE_PROVIDER_SITE_OTHER): Payer: PPO

## 2021-10-22 DIAGNOSIS — I495 Sick sinus syndrome: Secondary | ICD-10-CM | POA: Diagnosis not present

## 2021-10-22 DIAGNOSIS — I480 Paroxysmal atrial fibrillation: Secondary | ICD-10-CM | POA: Diagnosis not present

## 2021-10-22 DIAGNOSIS — R001 Bradycardia, unspecified: Secondary | ICD-10-CM | POA: Diagnosis not present

## 2021-10-22 DIAGNOSIS — I4819 Other persistent atrial fibrillation: Secondary | ICD-10-CM

## 2021-10-22 LAB — CUP PACEART INCLINIC DEVICE CHECK
Battery Remaining Longevity: 121 mo
Battery Voltage: 3.21 V
Brady Statistic AP VP Percent: 95.94 %
Brady Statistic AP VS Percent: 0.49 %
Brady Statistic AS VP Percent: 0.75 %
Brady Statistic AS VS Percent: 2.81 %
Brady Statistic RA Percent Paced: 96.4 %
Brady Statistic RV Percent Paced: 96.69 %
Date Time Interrogation Session: 20230817155507
Implantable Lead Implant Date: 20230807
Implantable Lead Implant Date: 20230807
Implantable Lead Location: 753859
Implantable Lead Location: 753860
Implantable Lead Model: 3830
Implantable Lead Model: 5076
Implantable Pulse Generator Implant Date: 20230807
Lead Channel Impedance Value: 380 Ohm
Lead Channel Impedance Value: 513 Ohm
Lead Channel Impedance Value: 532 Ohm
Lead Channel Impedance Value: 608 Ohm
Lead Channel Pacing Threshold Amplitude: 0.75 V
Lead Channel Pacing Threshold Amplitude: 0.875 V
Lead Channel Pacing Threshold Amplitude: 1 V
Lead Channel Pacing Threshold Pulse Width: 0.4 ms
Lead Channel Pacing Threshold Pulse Width: 0.4 ms
Lead Channel Pacing Threshold Pulse Width: 0.4 ms
Lead Channel Sensing Intrinsic Amplitude: 12.625 mV
Lead Channel Sensing Intrinsic Amplitude: 18.125 mV
Lead Channel Sensing Intrinsic Amplitude: 4.125 mV
Lead Channel Sensing Intrinsic Amplitude: 4.125 mV
Lead Channel Setting Pacing Amplitude: 3.5 V
Lead Channel Setting Pacing Amplitude: 3.5 V
Lead Channel Setting Pacing Pulse Width: 0.4 ms
Lead Channel Setting Sensing Sensitivity: 1.2 mV

## 2021-10-22 NOTE — Patient Instructions (Addendum)
   After Your Pacemaker   Monitor your pacemaker site for redness, swelling, and drainage. Call the device clinic at [phone] if you experience these symptoms or fever/chills.  Your incision was closed with Steri-strips or staples:  You may shower 7 days after your procedure and wash your incision with soap and water. Avoid lotions, ointments, or perfumes over your incision until it is well-healed.  You may use a hot tub or a pool after your wound check appointment if the incision is completely closed.  Do not lift, push or pull greater than 10 pounds with the affected arm until November 24 2021. There are no other restrictions in arm movement after your wound check appointment.  You may drive, unless driving has been restricted by your healthcare providers.   Remote monitoring is used to monitor your pacemaker from home. This monitoring is scheduled every 91 days by our office. It allows [place] to keep an eye on the functioning of your device to ensure it is working properly. You will routinely see your Electrophysiologist annually (more often if necessary).

## 2021-10-22 NOTE — Progress Notes (Signed)
Wound check appointment. Steri-strips removed. Wound without redness or edema. Incision edges approximated, wound well healed. Normal device function. Thresholds, sensing, and impedances consistent with implant measurements. Device programmed at 3.5V/auto capture programmed on for extra safety margin until 3 month visit. Histogram distribution appropriate for patient and level of activity. No mode switches or high ventricular rates noted. Patient educated about wound care, arm mobility, lifting restrictions. ROV in 3 months with implanting physician.  Patient is to go home and re-send a transmission as we are showing her new home remote device should be set up now.

## 2021-10-23 ENCOUNTER — Ambulatory Visit: Payer: Self-pay

## 2021-10-23 NOTE — Patient Outreach (Signed)
  Care Coordination   10/23/2021 Name: ERAINA WINNIE MRN: [id number] DOB: 12/23/1935   Care Coordination Outreach Attempts:  An unsuccessful telephone outreach was attempted today to offer the patient information about available care coordination services as a benefit of their health plan.   Follow Up Plan:  Additional outreach attempts will be made to offer the patient care coordination information and services.   Encounter Outcome:  No Answer  Care Coordination Interventions Activated:  No   Care Coordination Interventions:  No, not indicated    [facility] [phone]

## 2021-10-26 NOTE — Telephone Encounter (Signed)
Pt sent transmission on 10/26/2021. Leigh, rn reviewed the transmission and everything looked normal. The patient verbalized understanding and thanked me for the call.

## 2021-10-27 DIAGNOSIS — H31091 Other chorioretinal scars, right eye: Secondary | ICD-10-CM | POA: Diagnosis not present

## 2021-10-27 DIAGNOSIS — H35372 Puckering of macula, left eye: Secondary | ICD-10-CM | POA: Diagnosis not present

## 2021-10-27 DIAGNOSIS — D3132 Benign neoplasm of left choroid: Secondary | ICD-10-CM | POA: Diagnosis not present

## 2021-10-27 DIAGNOSIS — H43812 Vitreous degeneration, left eye: Secondary | ICD-10-CM | POA: Diagnosis not present

## 2021-10-27 DIAGNOSIS — H353122 Nonexudative age-related macular degeneration, left eye, intermediate dry stage: Secondary | ICD-10-CM | POA: Diagnosis not present

## 2021-10-30 ENCOUNTER — Telehealth: Payer: Self-pay | Admitting: Internal Medicine

## 2021-10-30 NOTE — Telephone Encounter (Signed)
Pt c/o Syncope: STAT if syncope occurred within 30 minutes and pt complains of lightheadedness  High Priority if episode of passing out, completely, today or in last 24 hours   Did you pass out today?  No   When is the last time you passed out? Just felt faint   Has this occurred multiple times?  Multiple times  Did you have any symptoms prior to passing out?     Patient called stating that her pacemaker is set on 70 and she feels this may be too fast.  Patient stated she only feels like laying down and does not have energy.  Patient stated her HR is at 70 and her blood pressure is good.  Patient stated yesterday she had a nagging headache and sometimes her chest feels full, but no achy pains.

## 2021-10-30 NOTE — Telephone Encounter (Signed)
Returned call to Pt.  She states since Monday she has felt "sick".  States she has been sleepy.  She states she has been dizzy and felt faint at times.  She also states she has been sweating.  Pt sent a remote transmission.  This was reviewed by Dr. Quentin Ore.  Normal transmission.    Follow up made with Oda Kilts, PA next Friday to recheck device and provide reassurance.  Pt aware.  Advised her symptoms could be related to sickness, and she should reach out to her PCP if symptoms persist.  She indicates understanding.

## 2021-11-06 ENCOUNTER — Ambulatory Visit: Payer: PPO | Attending: Student | Admitting: Student

## 2021-11-06 ENCOUNTER — Encounter: Payer: Self-pay | Admitting: Student

## 2021-11-06 VITALS — BP 108/62 | HR 69 | Ht 64.5 in | Wt 184.0 lb

## 2021-11-06 DIAGNOSIS — I480 Paroxysmal atrial fibrillation: Secondary | ICD-10-CM

## 2021-11-06 DIAGNOSIS — R5381 Other malaise: Secondary | ICD-10-CM

## 2021-11-06 DIAGNOSIS — I10 Essential (primary) hypertension: Secondary | ICD-10-CM | POA: Diagnosis not present

## 2021-11-06 DIAGNOSIS — I495 Sick sinus syndrome: Secondary | ICD-10-CM | POA: Diagnosis not present

## 2021-11-06 LAB — CUP PACEART INCLINIC DEVICE CHECK
Battery Remaining Longevity: 119 mo
Battery Voltage: 3.2 V
Brady Statistic AP VP Percent: 94.16 %
Brady Statistic AP VS Percent: 0.33 %
Brady Statistic AS VP Percent: 2.71 %
Brady Statistic AS VS Percent: 2.8 %
Brady Statistic RA Percent Paced: 94.48 %
Brady Statistic RV Percent Paced: 96.87 %
Date Time Interrogation Session: 20230901123548
Implantable Lead Implant Date: 20230807
Implantable Lead Implant Date: 20230807
Implantable Lead Location: 753859
Implantable Lead Location: 753860
Implantable Lead Model: 3830
Implantable Lead Model: 5076
Implantable Pulse Generator Implant Date: 20230807
Lead Channel Impedance Value: 399 Ohm
Lead Channel Impedance Value: 418 Ohm
Lead Channel Impedance Value: 513 Ohm
Lead Channel Impedance Value: 551 Ohm
Lead Channel Pacing Threshold Amplitude: 0.5 V
Lead Channel Pacing Threshold Amplitude: 0.875 V
Lead Channel Pacing Threshold Pulse Width: 0.4 ms
Lead Channel Pacing Threshold Pulse Width: 0.4 ms
Lead Channel Sensing Intrinsic Amplitude: 16.125 mV
Lead Channel Sensing Intrinsic Amplitude: 17.5 mV
Lead Channel Sensing Intrinsic Amplitude: 4.875 mV
Lead Channel Sensing Intrinsic Amplitude: 5.125 mV
Lead Channel Setting Pacing Amplitude: 3.5 V
Lead Channel Setting Pacing Amplitude: 3.5 V
Lead Channel Setting Pacing Pulse Width: 0.4 ms
Lead Channel Setting Sensing Sensitivity: 1.2 mV

## 2021-11-06 NOTE — Progress Notes (Signed)
Electrophysiology Office Note Date: 11/06/2021  ID:  [name], DOB 02/20/1936, MRN [id number]  PCP: Lawerance Cruel, MD Primary Cardiologist: Sinclair Grooms, MD Electrophysiologist: Cristopher Peru, MD   CC: Pacemaker follow-up  Sheila Gardner is a 86 y.o. female seen today for Cristopher Peru, MD for acute visit due to fatigue s/p PPM implant .    Since last being seen in our clinic the patient reports doing OK. She continues to do about the same amount of exercise, but has not noticed any improvement in her functional status. HR has consistently been 69-71 on pulse ox. She has had several episode of what she felt like were heart racing and dizziness. Otherwise, she denies chest pain, PND, orthopnea, nausea, vomiting, dizziness, syncope, edema, weight gain, or early satiety. SOB with more than mild exertion, but able to get around OK.   Device History: Medtronic Dual Chamber PPM implanted 10/12/2021 for SND Medtronic ILR 11/2019 for syncope, Explanted for [facility] 10/2021  Past Medical History:  Diagnosis Date   Arthritis    Atrial fibrillation (HCC)    Breast cancer ([place])    Cancer ([place])    Breast- rt   Cough with sputum 2016   Elevated liver function tests 05/09/2013   GERD (gastroesophageal reflux disease)    COSTOCHONDRITIS   Heart murmur    Hip pain 05/08/2014   Hyperlipidemia    Hypertension    Lipoma    back of neck   PONV (postoperative nausea and vomiting)    only after rt hip surgery   Primary osteoarthritis of left knee 11/04/2015   Primary osteoarthritis of right hip 07/30/2014   Squamous cell carcinoma of skin 08/21/2019   atypical squa. proliferation-Left corner of mouth   Past Surgical History:  Procedure Laterality Date   ANKLE FRACTURE SURGERY Right 2013   RIGHT    BREAST LUMPECTOMY Right 2011   BREAST SURGERY  04/22/2009   Rt lumpectomy   CARDIOVERSION N/A 02/19/2019   Procedure: CARDIOVERSION;  Surgeon: Dorothy Spark, MD;  Location: [facility];   Service: Cardiovascular;  Laterality: N/A;   CORONARY ATHERECTOMY N/A 04/30/2019   Procedure: CORONARY ATHERECTOMY;  Surgeon: Belva Crome, MD;  Location: [facility];  Service: Cardiovascular;  Laterality: N/A;  LAD   CORONARY STENT INTERVENTION N/A 04/30/2019   Procedure: CORONARY STENT INTERVENTION;  Surgeon: Belva Crome, MD;  Location: [facility];  Service: Cardiovascular;  Laterality: N/A;  DES TO PROX-MID LAD   EYE SURGERY  2001   macular hole   JOINT REPLACEMENT  2016   KNEE SURGERY     LEFT HEART CATH AND CORONARY ANGIOGRAPHY N/A 04/30/2019   Procedure: LEFT HEART CATH AND CORONARY ANGIOGRAPHY;  Surgeon: Belva Crome, MD;  Location: [facility];  Service: Cardiovascular;  Laterality: N/A;   LEFT HEART CATH AND CORONARY ANGIOGRAPHY N/A 12/08/2020   Procedure: LEFT HEART CATH AND CORONARY ANGIOGRAPHY;  Surgeon: Troy Sine, MD;  Location: [facility];  Service: Cardiovascular;  Laterality: N/A;   LOOP RECORDER REMOVAL N/A 10/12/2021   Procedure: LOOP RECORDER REMOVAL;  Surgeon: Evans Lance, MD;  Location: [facility];  Service: Cardiovascular;  Laterality: N/A;   MIDDLE EAR SURGERY Left 08/18/2015   repair eardrum and reconstruction   OVARY SURGERY  40 years ago   wedge   PACEMAKER IMPLANT N/A 10/12/2021   Procedure: PACEMAKER IMPLANT;  Surgeon: Evans Lance, MD;  Location:  [facility];  Service: Cardiovascular;  Laterality: N/A;   TOTAL HIP ARTHROPLASTY Right 07/30/2014   Procedure: TOTAL HIP ARTHROPLASTY ANTERIOR APPROACH;  Surgeon: Melrose Nakayama, MD;  Location: [facility];  Service: Orthopedics;  Laterality: Right;   TOTAL KNEE ARTHROPLASTY Left 11/04/2015   TOTAL KNEE ARTHROPLASTY Left 11/04/2015   Procedure: TOTAL KNEE ARTHROPLASTY;  Surgeon: Melrose Nakayama, MD;  Location: [facility];  Service: Orthopedics;  Laterality: Left;   TYMPANOPLASTY  30 years ago    Current Outpatient Medications  Medication Sig Dispense Refill    acetaminophen (TYLENOL) 500 MG tablet Take 1,000 mg by mouth every 6 (six) hours as needed for moderate pain or mild pain.     ascorbic acid (VITAMIN C) 500 MG tablet Take 500 mg by mouth daily.     Biotin 5000 MCG TABS Take 5,000 Units by mouth daily with breakfast.     Cholecalciferol (VITAMIN D-3) 1000 UNITS CAPS Take 2,000 Units by mouth daily with breakfast.     cyanocobalamin 1000 MCG tablet Take 1,000 mcg by mouth daily.      diclofenac Sodium (VOLTAREN) 1 % GEL Apply 2 g topically daily as needed (pain).     dofetilide (TIKOSYN) 125 MCG capsule Take 1 capsule (125 mcg total) by mouth 2 (two) times daily. 60 capsule 11   ELIQUIS 5 MG TABS tablet Take 1 tablet (5 mg total) by mouth 2 (two) times daily. 180 tablet 1   ferrous sulfate 325 (65 FE) MG tablet Take 325 mg by mouth daily with breakfast.     furosemide (LASIX) 40 MG tablet Take 1 tablet (40 mg total) by mouth daily. 90 tablet 1   gabapentin (NEURONTIN) 300 MG capsule Take 300 mg by mouth 2 (two) times daily.     hydrALAZINE (APRESOLINE) 25 MG tablet Take 1 tablet (25 mg total) by mouth in the morning and at bedtime. 180 tablet 3   hydrocortisone cream 1 % Apply 1 Application topically daily as needed for itching (irritation). Ultra Moisturizing     iron polysaccharides (NIFEREX) 150 MG capsule Take 150 mg by mouth 2 (two) times daily.     lidocaine (LMX) 4 % cream Apply 1 Application topically daily as needed (leg pain).     LORazepam (ATIVAN) 1 MG tablet Take 1 mg by mouth 2 (two) times daily as needed for anxiety.     magnesium oxide (MAG-OX) 400 MG tablet Take 1 tablet (400 mg total) by mouth daily. 90 tablet 3   meclizine (ANTIVERT) 12.5 MG tablet Take 1 tablet (12.5 mg total) by mouth daily as needed for dizziness. 30 tablet 0   Melatonin 10 MG TABS Take 10 mg by mouth at bedtime as needed (Sleep).     neomycin-polymyxin b-dexamethasone (MAXITROL) 3.5-10000-0.1 SUSP Place 1 drop into the left eye 4 (four) times daily.      Omega-3 Fatty Acids (OMEGA 3 500 PO) Take 1 tablet by mouth daily. Multi omega 3     OVER THE COUNTER MEDICATION Take 1 tablet by mouth daily. Health brain all day focus     OVER THE COUNTER MEDICATION Take 1 capsule by mouth daily. Balance of Nature/Fruits&Vegetables     pantoprazole (PROTONIX) 20 MG tablet Take 20 mg by mouth daily at 6 (six) AM.     potassium chloride (KLOR-CON) 10 MEQ tablet Take 40 mEq by mouth daily.     Probiotic Product ([employer] PROBIOTIC) CHEW Chew 1 tablet by mouth daily at 6 (six) AM.  pyridOXINE (VITAMIN B-6) 100 MG tablet Take 100 mg by mouth daily.     rosuvastatin (CRESTOR) 40 MG tablet Take 1 tablet (40 mg total) by mouth daily at 6 PM. 30 tablet 6   tetrahydrozoline 0.05 % ophthalmic solution Place 1 drop into both eyes daily as needed (dry eyes).     valsartan (DIOVAN) 320 MG tablet Take 1 tablet (320 mg total) by mouth daily. (Patient taking differently: Take 320 mg by mouth every evening.) 90 tablet 3   vitamin E 400 UNIT capsule Take 400 Units by mouth daily.     No current facility-administered medications for this visit.    Allergies:   Fenofibrate and Ondansetron   Social History: Social History   Socioeconomic History   Marital status: Widowed    Spouse name: Not on file   Number of children: Not on file   Years of education: Not on file   Highest education level: Not on file  Occupational History   Not on file  Tobacco Use   Smoking status: Never   Smokeless tobacco: Never  Vaping Use   Vaping Use: Never used  Substance and Sexual Activity   Alcohol use: Not Currently   Drug use: No   Sexual activity: Not Currently    Birth control/protection: None  Other Topics Concern   Not on file  Social History Narrative   Not on file   Social Determinants of Health   Financial Resource Strain: Not on file  Food Insecurity: Not on file  Transportation Needs: Not on file  Physical Activity: Not on file  Stress: Not on file  Social  Connections: Not on file  Intimate Partner Violence: Not on file    Family History: Family History  Problem Relation Age of Onset   Heart disease Mother    Cancer Brother        colon, pancreatic, brain     Review of Systems: All other systems reviewed and are otherwise negative except as noted above.  Physical Exam: Vitals:   11/06/21 1138  BP: 108/62  Pulse: 69  SpO2: 97%  Weight: 184 lb (83.5 kg)  Height: 5' 4.5" (1.638 m)     GEN- The patient is well appearing, alert and oriented x 3 today.   HEENT: normocephalic, atraumatic; sclera clear, conjunctiva pink; hearing intact; oropharynx clear; neck supple  Lungs- Clear to ausculation bilaterally, normal work of breathing.  No wheezes, rales, rhonchi Heart- Regular rate and rhythm, no murmurs, rubs or gallops  GI- soft, non-tender, non-distended, bowel sounds present  Extremities- no clubbing or cyanosis. No edema MS- no significant deformity or atrophy Skin- warm and dry, no rash or lesion; PPM pocket well healed Psych- euthymic mood, full affect Neuro- strength and sensation are intact  PPM Interrogation- reviewed in detail today,  See PACEART report  EKG:  EKG is not ordered today.  Recent Labs: 09/21/2021: Hemoglobin 11.4; Platelets 267 10/13/2021: BUN 17; Creatinine, Ser 0.64; Magnesium 2.1; Potassium 3.7; Sodium 133   Wt Readings from Last 3 Encounters:  11/06/21 184 lb (83.5 kg)  10/12/21 180 lb (81.6 kg)  09/24/21 182 lb 6.4 oz (82.7 kg)     Other studies Reviewed: Additional studies/ records that were reviewed today include: Previous EP office notes, Previous remote checks, Most recent labwork.   Assessment and Plan:  1. SND s/p Medtronic PPM  2. Malaise Normal PPM function See Pace Art report No changes today No HR variability Rate response turned on at this visit.  2. PAF Continue Tikosyn EKG 10/13/2021 showed AV dual pacing at 70 bpm  3. HTN Stable on current regimen   Current medicines  are reviewed at length with the patient today.    Disposition:   Follow up with Dr. Lovena Le as scheduled for 91 day post op check.     Jacalyn Lefevre, PA-C  11/06/2021 12:39 PM  [place] [address] [place] [address] [phone] (office) [phone] (fax)

## 2021-11-06 NOTE — Patient Instructions (Signed)
Medication Instructions:  Your physician recommends that you continue on your current medications as directed. Please refer to the Current Medication list given to you today.  *If you need a refill on your cardiac medications before your next appointment, please call your pharmacy*   Lab Work: None  If you have labs (blood work) drawn today and your tests are completely normal, you will receive your results only by: [facility] (if you have MyChart) OR A paper copy in the mail If you have any lab test that is abnormal or we need to change your treatment, we will call you to review the results.   Testing/Procedures: None  Follow-Up: As scheduled with Dr. Lovena Le on 01/21/22 at 11:45 AM.  Important Information About Sugar

## 2021-11-25 ENCOUNTER — Other Ambulatory Visit: Payer: Self-pay | Admitting: Family Medicine

## 2021-11-25 DIAGNOSIS — E2839 Other primary ovarian failure: Secondary | ICD-10-CM

## 2021-11-27 DIAGNOSIS — E1169 Type 2 diabetes mellitus with other specified complication: Secondary | ICD-10-CM | POA: Diagnosis not present

## 2021-11-27 DIAGNOSIS — D72829 Elevated white blood cell count, unspecified: Secondary | ICD-10-CM | POA: Diagnosis not present

## 2021-11-27 DIAGNOSIS — E782 Mixed hyperlipidemia: Secondary | ICD-10-CM | POA: Diagnosis not present

## 2021-11-27 DIAGNOSIS — I10 Essential (primary) hypertension: Secondary | ICD-10-CM | POA: Diagnosis not present

## 2021-11-27 DIAGNOSIS — Z87440 Personal history of urinary (tract) infections: Secondary | ICD-10-CM | POA: Diagnosis not present

## 2021-11-27 DIAGNOSIS — D1771 Benign lipomatous neoplasm of kidney: Secondary | ICD-10-CM | POA: Diagnosis not present

## 2021-12-03 DIAGNOSIS — J45909 Unspecified asthma, uncomplicated: Secondary | ICD-10-CM | POA: Diagnosis not present

## 2021-12-03 DIAGNOSIS — I4891 Unspecified atrial fibrillation: Secondary | ICD-10-CM | POA: Diagnosis not present

## 2021-12-03 DIAGNOSIS — E782 Mixed hyperlipidemia: Secondary | ICD-10-CM | POA: Diagnosis not present

## 2021-12-03 DIAGNOSIS — Z0000 Encounter for general adult medical examination without abnormal findings: Secondary | ICD-10-CM | POA: Diagnosis not present

## 2021-12-03 DIAGNOSIS — D6869 Other thrombophilia: Secondary | ICD-10-CM | POA: Diagnosis not present

## 2021-12-03 DIAGNOSIS — K219 Gastro-esophageal reflux disease without esophagitis: Secondary | ICD-10-CM | POA: Diagnosis not present

## 2021-12-03 DIAGNOSIS — E1169 Type 2 diabetes mellitus with other specified complication: Secondary | ICD-10-CM | POA: Diagnosis not present

## 2021-12-03 DIAGNOSIS — F411 Generalized anxiety disorder: Secondary | ICD-10-CM | POA: Diagnosis not present

## 2021-12-03 DIAGNOSIS — E559 Vitamin D deficiency, unspecified: Secondary | ICD-10-CM | POA: Diagnosis not present

## 2021-12-03 DIAGNOSIS — M79606 Pain in leg, unspecified: Secondary | ICD-10-CM | POA: Diagnosis not present

## 2021-12-03 DIAGNOSIS — D72829 Elevated white blood cell count, unspecified: Secondary | ICD-10-CM | POA: Diagnosis not present

## 2021-12-03 DIAGNOSIS — I10 Essential (primary) hypertension: Secondary | ICD-10-CM | POA: Diagnosis not present

## 2021-12-13 ENCOUNTER — Other Ambulatory Visit: Payer: Self-pay | Admitting: Physician Assistant

## 2021-12-14 ENCOUNTER — Telehealth: Payer: Self-pay | Admitting: Internal Medicine

## 2021-12-14 MED ORDER — DOFETILIDE 125 MCG PO CAPS: 125.0000 ug | ORAL_CAPSULE | Freq: Two times a day (BID) | ORAL | 0 refills | Status: DC

## 2021-12-14 NOTE — Telephone Encounter (Signed)
Patient calling the office for samples of medication:   1.  What medication and dosage are you requesting samples for?   dofetilide (TIKOSYN) 125 MCG capsule    2.  Are you currently out of this medication? No - Last dose will be Thursday. Would like to know if she is able to get samples until medication arrives in the mail

## 2021-12-14 NOTE — Telephone Encounter (Signed)
S/w pt the office does not carry samples of Tikosyn.  Will send in # 30 to requested pharmacy till pt receives mail order. Confirmed pt's pharmacy.

## 2021-12-23 NOTE — Progress Notes (Unsigned)
Cardiology Office Note Date:  12/23/2021  Patient ID:  Sheila Gardner, Sheila Gardner 1935-10-24, MRN [id number] PCP:  Lawerance Cruel, MD  Cardiologist:  Dr. [name] Electrophysiologist: Dr. Lovena Le  ***refresh   Chief Complaint: *** pt concerned about pacer and nausea  History of Present Illness: Sheila Gardner is a 86 y.o. female with history of CAD (, PCI to LAD 2021)HTN, HLD, OA, AFib, chronotropic incompetence/SND > PPM   She saw Dr. Lovena Le in June 2023, having weak spells, no syncope, these seemed to correspond with her HR slow.  With HRs towards the 40's and her Afib, recommended PPM.  She was hesitant, d/w Dr. [name] at their visit in July, agreed that she may benefit from [facility].  Underwent implant 10/12/21  She was added on to Andy's schedule 11/06/21 for c/o new fatigue, palpitations and some SOB post PPM.  She reported HRs 69-71 on her pulse ox. PPM function was intact,  noted Blunted HR histograms and rate response was programmed on.  *** new symptoms? *** reduce Rate response slope? *** echo? *** tikosyn EKG, labs, med list  Device information MDT dual chamber PPM implanted 10/12/21 (loop removed)   Past Medical History:  Diagnosis Date   Arthritis    Atrial fibrillation ([place])    Breast cancer ([place])    Cancer ([place])    Breast- rt   Cough with sputum 2016   Elevated liver function tests 05/09/2013   GERD (gastroesophageal reflux disease)    COSTOCHONDRITIS   Heart murmur    Hip pain 05/08/2014   Hyperlipidemia    Hypertension    Lipoma    back of neck   PONV (postoperative nausea and vomiting)    only after rt hip surgery   Primary osteoarthritis of left knee 11/04/2015   Primary osteoarthritis of right hip 07/30/2014   Squamous cell carcinoma of skin 08/21/2019   atypical squa. proliferation-Left corner of mouth    Past Surgical History:  Procedure Laterality Date   ANKLE FRACTURE SURGERY Right 2013   RIGHT    BREAST LUMPECTOMY Right 2011   BREAST SURGERY   04/22/2009   Rt lumpectomy   CARDIOVERSION N/A 02/19/2019   Procedure: CARDIOVERSION;  Surgeon: Dorothy Spark, MD;  Location: [place];  Service: Cardiovascular;  Laterality: N/A;   CORONARY ATHERECTOMY N/A 04/30/2019   Procedure: CORONARY ATHERECTOMY;  Surgeon: Belva Crome, MD;  Location: [place] [facility];  Service: Cardiovascular;  Laterality: N/A;  LAD   CORONARY STENT INTERVENTION N/A 04/30/2019   Procedure: CORONARY STENT INTERVENTION;  Surgeon: Belva Crome, MD;  Location: [place] [facility];  Service: Cardiovascular;  Laterality: N/A;  DES TO PROX-MID LAD   EYE SURGERY  2001   macular hole   JOINT REPLACEMENT  2016   KNEE SURGERY     LEFT HEART CATH AND CORONARY ANGIOGRAPHY N/A 04/30/2019   Procedure: LEFT HEART CATH AND CORONARY ANGIOGRAPHY;  Surgeon: Belva Crome, MD;  Location: [place] [facility];  Service: Cardiovascular;  Laterality: N/A;   LEFT HEART CATH AND CORONARY ANGIOGRAPHY N/A 12/08/2020   Procedure: LEFT HEART CATH AND CORONARY ANGIOGRAPHY;  Surgeon: Troy Sine, MD;  Location: [place] [facility];  Service: Cardiovascular;  Laterality: N/A;   LOOP RECORDER REMOVAL N/A 10/12/2021   Procedure: LOOP RECORDER REMOVAL;  Surgeon: Evans Lance, MD;  Location: [place] [facility];  Service: Cardiovascular;  Laterality: N/A;   MIDDLE EAR SURGERY Left 08/18/2015   repair  eardrum and reconstruction   OVARY SURGERY  40 years ago   wedge   PACEMAKER IMPLANT N/A 10/12/2021   Procedure: PACEMAKER IMPLANT;  Surgeon: Evans Lance, MD;  Location: [place] [facility];  Service: Cardiovascular;  Laterality: N/A;   TOTAL HIP ARTHROPLASTY Right 07/30/2014   Procedure: TOTAL HIP ARTHROPLASTY ANTERIOR APPROACH;  Surgeon: Melrose Nakayama, MD;  Location: [place];  Service: Orthopedics;  Laterality: Right;   TOTAL KNEE ARTHROPLASTY Left 11/04/2015   TOTAL KNEE ARTHROPLASTY Left 11/04/2015   Procedure: TOTAL KNEE ARTHROPLASTY;  Surgeon: Melrose Nakayama, MD;  Location: [place];   Service: Orthopedics;  Laterality: Left;   TYMPANOPLASTY  30 years ago    Current Outpatient Medications  Medication Sig Dispense Refill   acetaminophen (TYLENOL) 500 MG tablet Take 1,000 mg by mouth every 6 (six) hours as needed for moderate pain or mild pain.     ascorbic acid (VITAMIN C) 500 MG tablet Take 500 mg by mouth daily.     Biotin 5000 MCG TABS Take 5,000 Units by mouth daily with breakfast.     Cholecalciferol (VITAMIN D-3) 1000 UNITS CAPS Take 2,000 Units by mouth daily with breakfast.     cyanocobalamin 1000 MCG tablet Take 1,000 mcg by mouth daily.      diclofenac Sodium (VOLTAREN) 1 % GEL Apply 2 g topically daily as needed (pain).     dofetilide (TIKOSYN) 125 MCG capsule Take 1 capsule (125 mcg total) by mouth 2 (two) times daily. 60 capsule 0   ELIQUIS 5 MG TABS tablet Take 1 tablet (5 mg total) by mouth 2 (two) times daily. 180 tablet 1   ferrous sulfate 325 (65 FE) MG tablet Take 325 mg by mouth daily with breakfast.     furosemide (LASIX) 40 MG tablet Take 1 tablet (40 mg total) by mouth daily. 90 tablet 1   gabapentin (NEURONTIN) 300 MG capsule Take 300 mg by mouth 2 (two) times daily.     hydrALAZINE (APRESOLINE) 25 MG tablet TAKE 1 TABLET(25 MG) BY MOUTH IN THE MORNING AND AT BEDTIME 180 tablet 3   hydrocortisone cream 1 % Apply 1 Application topically daily as needed for itching (irritation). Ultra Moisturizing     iron polysaccharides (NIFEREX) 150 MG capsule Take 150 mg by mouth 2 (two) times daily.     lidocaine (LMX) 4 % cream Apply 1 Application topically daily as needed (leg pain).     LORazepam (ATIVAN) 1 MG tablet Take 1 mg by mouth 2 (two) times daily as needed for anxiety.     magnesium oxide (MAG-OX) 400 MG tablet Take 1 tablet (400 mg total) by mouth daily. 90 tablet 3   meclizine (ANTIVERT) 12.5 MG tablet Take 1 tablet (12.5 mg total) by mouth daily as needed for dizziness. 30 tablet 0   Melatonin 10 MG TABS Take 10 mg by mouth at bedtime as needed  (Sleep).     neomycin-polymyxin b-dexamethasone (MAXITROL) 3.5-10000-0.1 SUSP Place 1 drop into the left eye 4 (four) times daily.     Omega-3 Fatty Acids (OMEGA 3 500 PO) Take 1 tablet by mouth daily. Multi omega 3     OVER THE COUNTER MEDICATION Take 1 tablet by mouth daily. Health brain all day focus     OVER THE COUNTER MEDICATION Take 1 capsule by mouth daily. Balance of Nature/Fruits&Vegetables     pantoprazole (PROTONIX) 20 MG tablet Take 20 mg by mouth daily at 6 (six) AM.     potassium chloride (KLOR-CON)  10 MEQ tablet Take 40 mEq by mouth daily.     Probiotic Product (CVS PROBIOTIC) CHEW Chew 1 tablet by mouth daily at 6 (six) AM.     pyridOXINE (VITAMIN B-6) 100 MG tablet Take 100 mg by mouth daily.     rosuvastatin (CRESTOR) 40 MG tablet Take 1 tablet (40 mg total) by mouth daily at 6 PM. 30 tablet 6   tetrahydrozoline 0.05 % ophthalmic solution Place 1 drop into both eyes daily as needed (dry eyes).     valsartan (DIOVAN) 320 MG tablet Take 1 tablet (320 mg total) by mouth daily. (Patient taking differently: Take 320 mg by mouth every evening.) 90 tablet 3   vitamin E 400 UNIT capsule Take 400 Units by mouth daily.     No current facility-administered medications for this visit.    Allergies:   Fenofibrate and Ondansetron   Social History:  The patient  reports that she has never smoked. She has never used smokeless tobacco. She reports that she does not currently use alcohol. She reports that she does not use drugs.   Family History:  The patient's family history includes Cancer in her brother; Heart disease in her mother.  ROS:  Please see the history of present illness.    All other systems are reviewed and otherwise negative.   PHYSICAL EXAM:  VS:  There were no vitals taken for this visit. BMI: There is no height or weight on file to calculate BMI. Well nourished, well developed, in no acute distress HEENT: normocephalic, atraumatic Neck: no JVD, carotid bruits or  masses Cardiac:  *** RRR; no significant murmurs, no rubs, or gallops Lungs:  *** CTA b/l, no wheezing, rhonchi or rales Abd: soft, nontender MS: no deformity or *** atrophy Ext: *** no edema Skin: warm and dry, no rash Neuro:  No gross deficits appreciated Psych: euthymic mood, full affect  *** PPM site is stable, no tethering or discomfort   EKG:  Done today and reviewed by myself shows  ***  Device interrogation done today and reviewed by myself:  ***   12/30/20: TTE  1. Left ventricular ejection fraction, by estimation, is 65 to 70%. The  left ventricle has normal function. The left ventricle has no regional  wall motion abnormalities. Left ventricular diastolic parameters are  indeterminate.   2. Right ventricular systolic function is normal. The right ventricular  size is normal. There is normal pulmonary artery systolic pressure.   3. No evidence of mitral valve regurgitation. Moderate mitral annular  calcification.   4. AV with mild calcification/sclerosis. . The aortic valve is normal in  structure. Aortic valve regurgitation is not visualized. No aortic  stenosis is present.   5. The inferior vena cava is normal in size with greater than 50%  respiratory variability, suggesting right atrial pressure of 3 mmHg.   Conclusion(s)/Recommendation(s): Otherwise normal echocardiogram, with  minor abnormalities described in the report. No significant changes from  echo 12/07/2020.    12/08/21: LHC   Dist Cx lesion is 50% stenosed.   Mid LAD-1 lesion is 40% stenosed with 50% stenosed side branch in 2nd Diag.   Dist LAD lesion is 30% stenosed.   Mid LAD-2 lesion is 30% stenosed.   Previously placed Dist LM to Mid LAD stent (unknown type) is  widely patent.   The left ventricular systolic function is normal.   LV end diastolic pressure is normal.   The left ventricular ejection fraction is 55-65% by visual estimate.  Widely patent ostial to proximal LAD stent at site of  prior atherectomy/DES stenting with 40 to 50% LAD stenoses beyond the stented segment and 30% mid and mid distal stenosis; large codominant left circumflex vessel with 50% stenosis after the OM 2 vessel in the AV groove circumflex and normal RCA.   Normal LV function without focal segmental wall motion abnormalities with EF estimated 55 to 60%.  LVEDP 14 mmHg    Recent Labs: 09/21/2021: Hemoglobin 11.4; Platelets 267 10/13/2021: BUN 17; Creatinine, Ser 0.64; Magnesium 2.1; Potassium 3.7; Sodium 133  No results found for requested labs within last 365 days.   CrCl cannot be calculated (Patient's most recent lab result is older than the maximum 21 days allowed.).   Wt Readings from Last 3 Encounters:  11/06/21 184 lb (83.5 kg)  10/12/21 180 lb (81.6 kg)  09/24/21 182 lb 6.4 oz (82.7 kg)     Other studies reviewed: Additional studies/records reviewed today include: summarized above  ASSESSMENT AND PLAN:  PPM ***  Persistent Afib CHA2DS2Vasc is 5, on *** Eliquis, appropriately dosed Tikosyn ***  CAD *** C/w Dr. [name]  HTN ***  Disposition: F/u with ***  Current medicines are reviewed at length with the patient today.  The patient did not have any concerns regarding medicines.  Venetia Night, PA-C 12/23/2021 12:08 PM     [address] [place] [id number] [phone] (office)  [phone] (fax)

## 2021-12-24 ENCOUNTER — Ambulatory Visit: Payer: PPO | Attending: Physician Assistant | Admitting: Physician Assistant

## 2021-12-24 ENCOUNTER — Encounter: Payer: Self-pay | Admitting: Physician Assistant

## 2021-12-24 VITALS — BP 132/72 | HR 82 | Ht 64.5 in | Wt 185.0 lb

## 2021-12-24 DIAGNOSIS — R110 Nausea: Secondary | ICD-10-CM | POA: Diagnosis not present

## 2021-12-24 DIAGNOSIS — Z79899 Other long term (current) drug therapy: Secondary | ICD-10-CM

## 2021-12-24 DIAGNOSIS — Z950 Presence of cardiac pacemaker: Secondary | ICD-10-CM

## 2021-12-24 DIAGNOSIS — Z5181 Encounter for therapeutic drug level monitoring: Secondary | ICD-10-CM

## 2021-12-24 DIAGNOSIS — I10 Essential (primary) hypertension: Secondary | ICD-10-CM | POA: Diagnosis not present

## 2021-12-24 DIAGNOSIS — R5383 Other fatigue: Secondary | ICD-10-CM | POA: Diagnosis not present

## 2021-12-24 DIAGNOSIS — I480 Paroxysmal atrial fibrillation: Secondary | ICD-10-CM | POA: Diagnosis not present

## 2021-12-24 LAB — CUP PACEART INCLINIC DEVICE CHECK
Date Time Interrogation Session: 20231019190825
Implantable Lead Implant Date: 20230807
Implantable Lead Implant Date: 20230807
Implantable Lead Location: 753859
Implantable Lead Location: 753860
Implantable Lead Model: 3830
Implantable Lead Model: 5076
Implantable Pulse Generator Implant Date: 20230807
Lead Channel Pacing Threshold Amplitude: 0.75 V
Lead Channel Pacing Threshold Amplitude: 0.75 V
Lead Channel Pacing Threshold Pulse Width: 0.4 ms
Lead Channel Pacing Threshold Pulse Width: 0.4 ms
Lead Channel Sensing Intrinsic Amplitude: 15.5 mV
Lead Channel Sensing Intrinsic Amplitude: 4.1 mV

## 2021-12-24 NOTE — Patient Instructions (Signed)
Medication Instructions:    *If you need a refill on your cardiac medications before your next appointment, please call your pharmacy*   Lab Work:   If you have labs (blood work) drawn today and your tests are completely normal, you will receive your results only by: [place] (if you have MyChart) OR A paper copy in the mail If you have any lab test that is abnormal or we need to change your treatment, we will call you to review the results.   Testing/Procedures:    Follow-Up: At [facility], you and your health needs are our priority.  As part of our continuing mission to provide you with exceptional heart care, we have created designated Provider Care Teams.  These Care Teams include your primary Cardiologist (physician) and Advanced Practice Providers (APPs -  Physician Assistants and Nurse Practitioners) who all work together to provide you with the care you need, when you need it.  We recommend signing up for the patient portal called "MyChart".  Sign up information is provided on this After Visit Summary.  MyChart is used to connect with patients for Virtual Visits (Telemedicine).  Patients are able to view lab/test results, encounter notes, upcoming appointments, etc.  Non-urgent messages can be sent to your provider as well.   To learn more about what you can do with MyChart, go to [email or web address].    Your next appointment:   AS SCHEDULED   The format for your next appointment:   In Person  Provider:   Cristopher [place], MD    Other Instructions   Important Information About Sugar

## 2021-12-25 DIAGNOSIS — Z6831 Body mass index (BMI) 31.0-31.9, adult: Secondary | ICD-10-CM | POA: Diagnosis not present

## 2021-12-25 DIAGNOSIS — R110 Nausea: Secondary | ICD-10-CM | POA: Diagnosis not present

## 2021-12-25 DIAGNOSIS — K8020 Calculus of gallbladder without cholecystitis without obstruction: Secondary | ICD-10-CM | POA: Diagnosis not present

## 2021-12-25 DIAGNOSIS — R1310 Dysphagia, unspecified: Secondary | ICD-10-CM | POA: Diagnosis not present

## 2021-12-25 LAB — BASIC METABOLIC PANEL
BUN/Creatinine Ratio: 20 (ref 12–28)
BUN: 14 mg/dL (ref 8–27)
CO2: 28 mmol/L (ref 20–29)
Calcium: 10.1 mg/dL (ref 8.7–10.3)
Chloride: 96 mmol/L (ref 96–106)
Creatinine, Ser: 0.69 mg/dL (ref 0.57–1.00)
Glucose: 98 mg/dL (ref 70–99)
Potassium: 4.4 mmol/L (ref 3.5–5.2)
Sodium: 135 mmol/L (ref 134–144)
eGFR: 86 mL/min/{1.73_m2} (ref >59)

## 2021-12-25 LAB — CBC
Hematocrit: 34.9 % (ref 34.0–46.6)
Hemoglobin: 11.4 g/dL (ref 11.1–15.9)
MCH: 30.5 pg (ref 26.6–33.0)
MCHC: 32.7 g/dL (ref 31.5–35.7)
MCV: 93 fL (ref 79–97)
Platelets: 231 10*3/uL (ref 150–450)
RBC: 3.74 x10E6/uL — ABNORMAL LOW (ref 3.77–5.28)
RDW: 12.4 % (ref 11.7–15.4)
WBC: 7.3 10*3/uL (ref 3.4–10.8)

## 2021-12-25 LAB — MAGNESIUM: Magnesium: 2.3 mg/dL (ref 1.6–2.3)

## 2021-12-30 DIAGNOSIS — D1771 Benign lipomatous neoplasm of kidney: Secondary | ICD-10-CM | POA: Diagnosis not present

## 2022-01-12 ENCOUNTER — Telehealth: Payer: Self-pay

## 2022-01-12 NOTE — Telephone Encounter (Signed)
I helped the patient reset her monitor. Monitor working normal.

## 2022-01-14 ENCOUNTER — Ambulatory Visit (INDEPENDENT_AMBULATORY_CARE_PROVIDER_SITE_OTHER): Payer: PPO

## 2022-01-14 DIAGNOSIS — I495 Sick sinus syndrome: Secondary | ICD-10-CM | POA: Diagnosis not present

## 2022-01-14 DIAGNOSIS — K8020 Calculus of gallbladder without cholecystitis without obstruction: Secondary | ICD-10-CM | POA: Insufficient documentation

## 2022-01-14 LAB — CUP PACEART REMOTE DEVICE CHECK
Battery Remaining Longevity: 141 mo
Battery Voltage: 3.18 V
Brady Statistic AP VP Percent: 97.82 %
Brady Statistic AP VS Percent: 0.56 %
Brady Statistic AS VP Percent: 0.42 %
Brady Statistic AS VS Percent: 1.2 %
Brady Statistic RA Percent Paced: 98.38 %
Brady Statistic RV Percent Paced: 98.24 %
Date Time Interrogation Session: 20231109044321
Implantable Lead Connection Status: 753985
Implantable Lead Connection Status: 753985
Implantable Lead Implant Date: 20230807
Implantable Lead Implant Date: 20230807
Implantable Lead Location: 753859
Implantable Lead Location: 753860
Implantable Lead Model: 3830
Implantable Lead Model: 5076
Implantable Pulse Generator Implant Date: 20230807
Lead Channel Impedance Value: 399 Ohm
Lead Channel Impedance Value: 494 Ohm
Lead Channel Impedance Value: 532 Ohm
Lead Channel Impedance Value: 646 Ohm
Lead Channel Pacing Threshold Amplitude: 0.5 V
Lead Channel Pacing Threshold Amplitude: 0.625 V
Lead Channel Pacing Threshold Pulse Width: 0.4 ms
Lead Channel Pacing Threshold Pulse Width: 0.4 ms
Lead Channel Sensing Intrinsic Amplitude: 18.25 mV
Lead Channel Sensing Intrinsic Amplitude: 18.25 mV
Lead Channel Sensing Intrinsic Amplitude: 4.25 mV
Lead Channel Sensing Intrinsic Amplitude: 4.25 mV
Lead Channel Setting Pacing Amplitude: 1.5 V
Lead Channel Setting Pacing Amplitude: 2 V
Lead Channel Setting Pacing Pulse Width: 0.4 ms
Lead Channel Setting Sensing Sensitivity: 1.2 mV
Zone Setting Status: 755011

## 2022-01-15 DIAGNOSIS — E669 Obesity, unspecified: Secondary | ICD-10-CM | POA: Diagnosis not present

## 2022-01-15 DIAGNOSIS — D6869 Other thrombophilia: Secondary | ICD-10-CM | POA: Diagnosis not present

## 2022-01-15 DIAGNOSIS — G4700 Insomnia, unspecified: Secondary | ICD-10-CM | POA: Diagnosis not present

## 2022-01-15 DIAGNOSIS — I110 Hypertensive heart disease with heart failure: Secondary | ICD-10-CM | POA: Diagnosis not present

## 2022-01-15 DIAGNOSIS — E876 Hypokalemia: Secondary | ICD-10-CM | POA: Diagnosis not present

## 2022-01-15 DIAGNOSIS — I509 Heart failure, unspecified: Secondary | ICD-10-CM | POA: Diagnosis not present

## 2022-01-15 DIAGNOSIS — I480 Paroxysmal atrial fibrillation: Secondary | ICD-10-CM | POA: Diagnosis not present

## 2022-01-15 DIAGNOSIS — E261 Secondary hyperaldosteronism: Secondary | ICD-10-CM | POA: Diagnosis not present

## 2022-01-15 DIAGNOSIS — H04123 Dry eye syndrome of bilateral lacrimal glands: Secondary | ICD-10-CM | POA: Diagnosis not present

## 2022-01-15 DIAGNOSIS — G629 Polyneuropathy, unspecified: Secondary | ICD-10-CM | POA: Diagnosis not present

## 2022-01-15 DIAGNOSIS — E785 Hyperlipidemia, unspecified: Secondary | ICD-10-CM | POA: Diagnosis not present

## 2022-01-15 DIAGNOSIS — D509 Iron deficiency anemia, unspecified: Secondary | ICD-10-CM | POA: Diagnosis not present

## 2022-01-21 ENCOUNTER — Ambulatory Visit: Payer: PPO | Attending: Internal Medicine | Admitting: Internal Medicine

## 2022-01-21 ENCOUNTER — Ambulatory Visit: Payer: PPO | Admitting: Internal Medicine

## 2022-01-21 ENCOUNTER — Encounter: Payer: Self-pay | Admitting: Internal Medicine

## 2022-01-21 VITALS — BP 122/70 | HR 93 | Ht 64.0 in | Wt 186.0 lb

## 2022-01-21 DIAGNOSIS — I495 Sick sinus syndrome: Secondary | ICD-10-CM | POA: Diagnosis not present

## 2022-01-21 DIAGNOSIS — I480 Paroxysmal atrial fibrillation: Secondary | ICD-10-CM

## 2022-01-21 DIAGNOSIS — I10 Essential (primary) hypertension: Secondary | ICD-10-CM

## 2022-01-21 DIAGNOSIS — Z950 Presence of cardiac pacemaker: Secondary | ICD-10-CM

## 2022-01-21 LAB — CUP PACEART INCLINIC DEVICE CHECK
Battery Remaining Longevity: 126 mo
Battery Voltage: 3.18 V
Brady Statistic AP VP Percent: 97.9 %
Brady Statistic AP VS Percent: 0.55 %
Brady Statistic AS VP Percent: 0.45 %
Brady Statistic AS VS Percent: 1.09 %
Brady Statistic RA Percent Paced: 98.46 %
Brady Statistic RV Percent Paced: 98.35 %
Date Time Interrogation Session: 20231116144344
Implantable Lead Connection Status: 753985
Implantable Lead Connection Status: 753985
Implantable Lead Implant Date: 20230807
Implantable Lead Implant Date: 20230807
Implantable Lead Location: 753859
Implantable Lead Location: 753860
Implantable Lead Model: 3830
Implantable Lead Model: 5076
Implantable Pulse Generator Implant Date: 20230807
Lead Channel Impedance Value: 418 Ohm
Lead Channel Impedance Value: 475 Ohm
Lead Channel Impedance Value: 551 Ohm
Lead Channel Impedance Value: 646 Ohm
Lead Channel Pacing Threshold Amplitude: 0.5 V
Lead Channel Pacing Threshold Amplitude: 0.625 V
Lead Channel Pacing Threshold Pulse Width: 0.4 ms
Lead Channel Pacing Threshold Pulse Width: 0.4 ms
Lead Channel Sensing Intrinsic Amplitude: 15.375 mV
Lead Channel Sensing Intrinsic Amplitude: 17.875 mV
Lead Channel Sensing Intrinsic Amplitude: 4.625 mV
Lead Channel Sensing Intrinsic Amplitude: 5.25 mV
Lead Channel Setting Pacing Amplitude: 2 V
Lead Channel Setting Pacing Amplitude: 2.5 V
Lead Channel Setting Pacing Pulse Width: 0.4 ms
Lead Channel Setting Sensing Sensitivity: 1.2 mV
Zone Setting Status: 755011

## 2022-01-21 NOTE — Patient Instructions (Addendum)
Medication Instructions:  Your physician recommends that you continue on your current medications as directed. Please refer to the Current Medication list given to you today.  *If you need a refill on your cardiac medications before your next appointment, please call your pharmacy*  Lab Work: None ordered.  If you have labs (blood work) drawn today and your tests are completely normal, you will receive your results only by: [place] (if you have MyChart) OR A paper copy in the mail If you have any lab test that is abnormal or we need to change your treatment, we will call you to review the results.  Testing/Procedures: None ordered.  Follow-Up: At [facility], you and your health needs are our priority.  As part of our continuing mission to provide you with exceptional heart care, we have created designated Provider Care Teams.  These Care Teams include your primary Cardiologist (physician) and Advanced Practice Providers (APPs -  Physician Assistants and Nurse Practitioners) who all work together to provide you with the care you need, when you need it.  We recommend signing up for the patient portal called "MyChart".  Sign up information is provided on this After Visit Summary.  MyChart is used to connect with patients for Virtual Visits (Telemedicine).  Patients are able to view lab/test results, encounter notes, upcoming appointments, etc.  Non-urgent messages can be sent to your provider as well.   To learn more about what you can do with MyChart, go to [email or web address].    Your next appointment:   1 year(s)  The format for your next appointment:   In Person  Provider:   Cristopher [place], MD{or one of the following Advanced Practice Providers on your designated Care Team:   Tommye Standard, [place] Legrand Como "Jonni Sanger" Chalmers Cater, [place]  Remote monitoring is used to monitor your Pacemaker from home. This monitoring reduces the number of office visits required to check your device to  one time per year. It allows [place] to keep an eye on the functioning of your device to ensure it is working properly. You are scheduled for a device check from home on 04/15/22. You may send your transmission at any time that day. If you have a wireless device, the transmission will be sent automatically. After your physician reviews your transmission, you will receive a postcard with your next transmission date.

## 2022-01-21 NOTE — Progress Notes (Signed)
HPI [name] returns today for followup. She is a pleasant 86 yo woman with a h/o PAF who has been maintained on dofetilide. She lost almost 10 lbs. She has had rare palpitations. She has class 2 dyspnea. She underwent DDD PM insertion several months ago. She still has occaisional periods of dizziness. She tries to be active. Allergies  Allergen Reactions   Fenofibrate Other (See Comments)    elevated LFTs   Ondansetron Other (See Comments)    drug interaction     Current Outpatient Medications  Medication Sig Dispense Refill   acetaminophen (TYLENOL) 500 MG tablet Take 1,000 mg by mouth every 6 (six) hours as needed for moderate pain or mild pain.     ascorbic acid (VITAMIN C) 500 MG tablet Take 500 mg by mouth daily.     Biotin 5000 MCG TABS Take 5,000 Units by mouth daily with breakfast.     Cholecalciferol (VITAMIN D-3) 1000 UNITS CAPS Take 2,000 Units by mouth daily with breakfast.     cyanocobalamin 1000 MCG tablet Take 1,000 mcg by mouth daily.      diclofenac Sodium (VOLTAREN) 1 % GEL Apply 2 g topically daily as needed (pain).     dofetilide (TIKOSYN) 125 MCG capsule Take 1 capsule (125 mcg total) by mouth 2 (two) times daily. 60 capsule 0   ELIQUIS 5 MG TABS tablet Take 1 tablet (5 mg total) by mouth 2 (two) times daily. 180 tablet 1   ferrous sulfate 325 (65 FE) MG tablet Take 325 mg by mouth daily with breakfast.     furosemide (LASIX) 40 MG tablet Take 1 tablet (40 mg total) by mouth daily. 90 tablet 1   gabapentin (NEURONTIN) 300 MG capsule Take 300 mg by mouth 2 (two) times daily.     hydrALAZINE (APRESOLINE) 25 MG tablet TAKE 1 TABLET(25 MG) BY MOUTH IN THE MORNING AND AT BEDTIME 180 tablet 3   hydrocortisone cream 1 % Apply 1 Application topically daily as needed for itching (irritation). Ultra Moisturizing     iron polysaccharides (NIFEREX) 150 MG capsule Take 150 mg by mouth 2 (two) times daily.     lidocaine (LMX) 4 % cream Apply 1 Application topically  daily as needed (leg pain).     LORazepam (ATIVAN) 1 MG tablet Take 1 mg by mouth 2 (two) times daily as needed for anxiety.     magnesium oxide (MAG-OX) 400 (240 Mg) MG tablet Take 1 tablet by mouth daily.     meclizine (ANTIVERT) 12.5 MG tablet Take 1 tablet (12.5 mg total) by mouth daily as needed for dizziness. 30 tablet 0   Melatonin 10 MG TABS Take 10 mg by mouth at bedtime as needed (Sleep).     neomycin-polymyxin b-dexamethasone (MAXITROL) 3.5-10000-0.1 SUSP Place 1 drop into the left eye 4 (four) times daily.     Omega-3 Fatty Acids (OMEGA 3 500 PO) Take 1 tablet by mouth daily. Multi omega 3     OVER THE COUNTER MEDICATION Take 1 tablet by mouth daily. Health brain all day focus     OVER THE COUNTER MEDICATION Take 1 capsule by mouth daily. Balance of Nature/Fruits&Vegetables     pantoprazole (PROTONIX) 20 MG tablet Take 20 mg by mouth daily at 6 (six) AM.     potassium chloride (KLOR-CON) 10 MEQ tablet Take 40 mEq by mouth daily.     Probiotic Product (CVS PROBIOTIC) CHEW Chew 1 tablet by mouth daily at 6 (six) AM.  pyridOXINE (VITAMIN B-6) 100 MG tablet Take 100 mg by mouth daily.     rosuvastatin (CRESTOR) 40 MG tablet Take 1 tablet (40 mg total) by mouth daily at 6 PM. 30 tablet 6   tetrahydrozoline 0.05 % ophthalmic solution Place 1 drop into both eyes daily as needed (dry eyes).     valsartan (DIOVAN) 320 MG tablet Take 1 tablet (320 mg total) by mouth daily. (Patient taking differently: Take 320 mg by mouth every evening.) 90 tablet 3   vitamin E 400 UNIT capsule Take 400 Units by mouth daily.     No current facility-administered medications for this visit.     Past Medical History:  Diagnosis Date   Arthritis    Atrial fibrillation ([facility])    Breast cancer ([place])    Cancer ([place])    Breast- rt   Cough with sputum 2016   Elevated liver function tests 05/09/2013   GERD (gastroesophageal reflux disease)    COSTOCHONDRITIS   Heart murmur    Hip pain 05/08/2014    Hyperlipidemia    Hypertension    Lipoma    back of neck   PONV (postoperative nausea and vomiting)    only after rt hip surgery   Primary osteoarthritis of left knee 11/04/2015   Primary osteoarthritis of right hip 07/30/2014   Squamous cell carcinoma of skin 08/21/2019   atypical squa. proliferation-Left corner of mouth    ROS:   All systems reviewed and negative except as noted in the HPI.   Past Surgical History:  Procedure Laterality Date   ANKLE FRACTURE SURGERY Right 2013   RIGHT    BREAST LUMPECTOMY Right 2011   BREAST SURGERY  04/22/2009   Rt lumpectomy   CARDIOVERSION N/A 02/19/2019   Procedure: CARDIOVERSION;  Surgeon: Sheila Spark, MD;  Location: [place];  Service: Cardiovascular;  Laterality: N/A;   CORONARY ATHERECTOMY N/A 04/30/2019   Procedure: CORONARY ATHERECTOMY;  Surgeon: Sheila Crome, MD;  Location: [place] [facility];  Service: Cardiovascular;  Laterality: N/A;  LAD   CORONARY STENT INTERVENTION N/A 04/30/2019   Procedure: CORONARY STENT INTERVENTION;  Surgeon: Sheila Crome, MD;  Location: [place] [facility];  Service: Cardiovascular;  Laterality: N/A;  DES TO PROX-MID LAD   EYE SURGERY  2001   macular hole   JOINT REPLACEMENT  2016   KNEE SURGERY     LEFT HEART CATH AND CORONARY ANGIOGRAPHY N/A 04/30/2019   Procedure: LEFT HEART CATH AND CORONARY ANGIOGRAPHY;  Surgeon: Sheila Crome, MD;  Location: [place] [facility];  Service: Cardiovascular;  Laterality: N/A;   LEFT HEART CATH AND CORONARY ANGIOGRAPHY N/A 12/08/2020   Procedure: LEFT HEART CATH AND CORONARY ANGIOGRAPHY;  Surgeon: Sheila Sine, MD;  Location: [place] [facility];  Service: Cardiovascular;  Laterality: N/A;   LOOP RECORDER REMOVAL N/A 10/12/2021   Procedure: LOOP RECORDER REMOVAL;  Surgeon: Sheila Lance, MD;  Location: [place] [facility];  Service: Cardiovascular;  Laterality: N/A;   MIDDLE EAR SURGERY Left 08/18/2015   repair eardrum and reconstruction   OVARY SURGERY  40  years ago   wedge   PACEMAKER IMPLANT N/A 10/12/2021   Procedure: PACEMAKER IMPLANT;  Surgeon: Sheila Lance, MD;  Location: [place] [facility];  Service: Cardiovascular;  Laterality: N/A;   TOTAL HIP ARTHROPLASTY Right 07/30/2014   Procedure: TOTAL HIP ARTHROPLASTY ANTERIOR APPROACH;  Surgeon: Melrose Nakayama, MD;  Location: [place];  Service: Orthopedics;  Laterality: Right;  TOTAL KNEE ARTHROPLASTY Left 11/04/2015   TOTAL KNEE ARTHROPLASTY Left 11/04/2015   Procedure: TOTAL KNEE ARTHROPLASTY;  Surgeon: Melrose Nakayama, MD;  Location: [place];  Service: Orthopedics;  Laterality: Left;   TYMPANOPLASTY  30 years ago     Family History  Problem Relation Age of Onset   Heart disease Mother    Cancer Brother        colon, pancreatic, brain     Social History   Socioeconomic History   Marital status: Widowed    Spouse name: Not on file   Number of children: Not on file   Years of education: Not on file   Highest education level: Not on file  Occupational History   Not on file  Tobacco Use   Smoking status: Never   Smokeless tobacco: Never  Vaping Use   Vaping Use: Never used  Substance and Sexual Activity   Alcohol use: Not Currently   Drug use: No   Sexual activity: Not Currently    Birth control/protection: None  Other Topics Concern   Not on file  Social History Narrative   Not on file   Social Determinants of Health   Financial Resource Strain: Not on file  Food Insecurity: Not on file  Transportation Needs: Not on file  Physical Activity: Not on file  Stress: Not on file  Social Connections: Not on file  Intimate Partner Violence: Not on file     BP 122/70   Pulse 93   Ht '5\' 4"'$  (1.626 m)   Wt 186 lb (84.4 kg)   SpO2 97%   BMI 31.93 kg/m   Physical Exam:  Well appearing NAD HEENT: Unremarkable Neck:  No JVD, no thyromegally Lymphatics:  No adenopathy Back:  No CVA tenderness Lungs:  Clear with no wheezes HEART:  Regular rate rhythm, no murmurs, no  rubs, no clicks Abd:  soft, positive bowel sounds, no organomegally, no rebound, no guarding Ext:  2 plus pulses, no edema, no cyanosis, no clubbing Skin:  No rashes no nodules Neuro:  CN II through XII intact, motor grossly intact  EKG - nsr with ventricular pacing  DEVICE  Normal device function.  See PaceArt for details.   Assess/Plan:  PAF - she is maintaining NSR. She will continue dofetilide. Sinus node dysfunction - the patient is asymptomatic s/p PPM insertion. HTN - her bp is minimally elevated. We will follow. No change in her meds today.  Syncope - no pauses. No symptoms. We will follow. Seh has had some dizziness. No low bp's documented.    Carleene Overlie Chantella Creech,MD

## 2022-01-22 DIAGNOSIS — H903 Sensorineural hearing loss, bilateral: Secondary | ICD-10-CM | POA: Diagnosis not present

## 2022-01-22 DIAGNOSIS — H7202 Central perforation of tympanic membrane, left ear: Secondary | ICD-10-CM | POA: Diagnosis not present

## 2022-01-22 NOTE — Progress Notes (Signed)
Remote pacemaker transmission.   

## 2022-01-28 ENCOUNTER — Other Ambulatory Visit: Payer: Self-pay | Admitting: Internal Medicine

## 2022-01-28 ENCOUNTER — Other Ambulatory Visit: Payer: Self-pay | Admitting: Interventional Cardiology

## 2022-02-02 NOTE — Progress Notes (Signed)
Cardiology Office Note:    Date:  02/03/2022   ID:  CABELLA KIMM, DOB Feb 14, 1936, MRN [id number]  PCP:  Lawerance Cruel, MD  Cardiologist:  Sinclair Grooms, MD   Referring MD: Lawerance Cruel, MD   Chief Complaint  Patient presents with   Coronary Artery Disease   Atrial Fibrillation   Follow-up    Tachycardia-bradycardia syndrome    History of Present Illness:    Sheila Gardner is a 86 y.o. female with a hx of persistent atrial fibrillation with rhythm control on Tikosyn, anticoagulation, heart murmur, hypertension, h/o syncope, osteoarthritis, dysfunction with permanent pacemaker placed 09/04/2021 for asymptomatic severe bradycardia Cristopher Peru).  Well.  Energy is improved.  No syncope or near syncope.  Exertional tolerance is improved.  No chest pain, orthopnea, or PND.  She denies angina.  She denies exertional dyspnea.  Past Medical History:  Diagnosis Date   Arthritis    Atrial fibrillation (HCC)    Breast cancer ([place])    Cancer ([place])    Breast- rt   Cough with sputum 2016   Elevated liver function tests 05/09/2013   GERD (gastroesophageal reflux disease)    COSTOCHONDRITIS   Heart murmur    Hip pain 05/08/2014   Hyperlipidemia    Hypertension    Lipoma    back of neck   PONV (postoperative nausea and vomiting)    only after rt hip surgery   Primary osteoarthritis of left knee 11/04/2015   Primary osteoarthritis of right hip 07/30/2014   Squamous cell carcinoma of skin 08/21/2019   atypical squa. proliferation-Left corner of mouth    Past Surgical History:  Procedure Laterality Date   ANKLE FRACTURE SURGERY Right 2013   RIGHT    BREAST LUMPECTOMY Right 2011   BREAST SURGERY  04/22/2009   Rt lumpectomy   CARDIOVERSION N/A 02/19/2019   Procedure: CARDIOVERSION;  Surgeon: Dorothy Spark, MD;  Location: [facility];  Service: Cardiovascular;  Laterality: N/A;   CORONARY ATHERECTOMY N/A 04/30/2019   Procedure: CORONARY ATHERECTOMY;  Surgeon:  Belva Crome, MD;  Location: [facility];  Service: Cardiovascular;  Laterality: N/A;  LAD   CORONARY STENT INTERVENTION N/A 04/30/2019   Procedure: CORONARY STENT INTERVENTION;  Surgeon: Belva Crome, MD;  Location: [facility];  Service: Cardiovascular;  Laterality: N/A;  DES TO PROX-MID LAD   EYE SURGERY  2001   macular hole   JOINT REPLACEMENT  2016   KNEE SURGERY     LEFT HEART CATH AND CORONARY ANGIOGRAPHY N/A 04/30/2019   Procedure: LEFT HEART CATH AND CORONARY ANGIOGRAPHY;  Surgeon: Belva Crome, MD;  Location: [facility];  Service: Cardiovascular;  Laterality: N/A;   LEFT HEART CATH AND CORONARY ANGIOGRAPHY N/A 12/08/2020   Procedure: LEFT HEART CATH AND CORONARY ANGIOGRAPHY;  Surgeon: Troy Sine, MD;  Location: [facility];  Service: Cardiovascular;  Laterality: N/A;   LOOP RECORDER REMOVAL N/A 10/12/2021   Procedure: LOOP RECORDER REMOVAL;  Surgeon: Evans Lance, MD;  Location: [facility];  Service: Cardiovascular;  Laterality: N/A;   MIDDLE EAR SURGERY Left 08/18/2015   repair eardrum and reconstruction   OVARY SURGERY  40 years ago   wedge   PACEMAKER IMPLANT N/A 10/12/2021   Procedure: PACEMAKER IMPLANT;  Surgeon: Evans Lance, MD;  Location: [facility];  Service: Cardiovascular;  Laterality: N/A;   TOTAL HIP ARTHROPLASTY Right 07/30/2014   Procedure: TOTAL  HIP ARTHROPLASTY ANTERIOR APPROACH;  Surgeon: Melrose Nakayama, MD;  Location: [facility];  Service: Orthopedics;  Laterality: Right;   TOTAL KNEE ARTHROPLASTY Left 11/04/2015   TOTAL KNEE ARTHROPLASTY Left 11/04/2015   Procedure: TOTAL KNEE ARTHROPLASTY;  Surgeon: Melrose Nakayama, MD;  Location: [facility];  Service: Orthopedics;  Laterality: Left;   TYMPANOPLASTY  30 years ago    Current Medications: Current Meds  Medication Sig   acetaminophen (TYLENOL) 500 MG tablet Take 1,000 mg by mouth every 6 (six) hours as needed for moderate pain or mild pain.   ascorbic acid (VITAMIN  C) 500 MG tablet Take 500 mg by mouth daily.   Biotin 5000 MCG TABS Take 5,000 Units by mouth daily with breakfast.   Cholecalciferol (VITAMIN D-3) 1000 UNITS CAPS Take 2,000 Units by mouth daily with breakfast.   cyanocobalamin 1000 MCG tablet Take 1,000 mcg by mouth daily.    diclofenac Sodium (VOLTAREN) 1 % GEL Apply 2 g topically daily as needed (pain).   dofetilide (TIKOSYN) 125 MCG capsule Take 1 capsule (125 mcg total) by mouth 2 (two) times daily.   ELIQUIS 5 MG TABS tablet Take 1 tablet (5 mg total) by mouth 2 (two) times daily.   ferrous sulfate 325 (65 FE) MG tablet Take 325 mg by mouth daily with breakfast.   furosemide (LASIX) 40 MG tablet TAKE 1 TABLET(40 MG) BY MOUTH DAILY   gabapentin (NEURONTIN) 300 MG capsule Take 300 mg by mouth 2 (two) times daily.   hydrALAZINE (APRESOLINE) 25 MG tablet TAKE 1 TABLET(25 MG) BY MOUTH IN THE MORNING AND AT BEDTIME   hydrocortisone cream 1 % Apply 1 Application topically daily as needed for itching (irritation). Ultra Moisturizing   iron polysaccharides (NIFEREX) 150 MG capsule Take 150 mg by mouth 2 (two) times daily.   lidocaine (LMX) 4 % cream Apply 1 Application topically daily as needed (leg pain).   LORazepam (ATIVAN) 1 MG tablet Take 1 mg by mouth 2 (two) times daily as needed for anxiety.   magnesium oxide (MAG-OX) 400 (240 Mg) MG tablet TAKE 1 TABLET(400 MG) BY MOUTH DAILY   meclizine (ANTIVERT) 12.5 MG tablet Take 1 tablet (12.5 mg total) by mouth daily as needed for dizziness.   Melatonin 10 MG TABS Take 10 mg by mouth at bedtime as needed (Sleep).   neomycin-polymyxin b-dexamethasone (MAXITROL) 3.5-10000-0.1 SUSP Place 1 drop into the left eye 4 (four) times daily.   Omega-3 Fatty Acids (OMEGA 3 500 PO) Take 1 tablet by mouth daily. Multi omega 3   OVER THE COUNTER MEDICATION Take 1 tablet by mouth daily. Health brain all day focus   OVER THE COUNTER MEDICATION Take 1 capsule by mouth daily. Balance of Nature/Fruits&Vegetables    pantoprazole (PROTONIX) 20 MG tablet Take 20 mg by mouth daily at 6 (six) AM.   potassium chloride (KLOR-CON) 10 MEQ tablet Take 40 mEq by mouth daily.   Probiotic Product ([employer] PROBIOTIC) CHEW Chew 1 tablet by mouth daily at 6 (six) AM.   pyridOXINE (VITAMIN B-6) 100 MG tablet Take 100 mg by mouth daily.   rosuvastatin (CRESTOR) 40 MG tablet Take 1 tablet (40 mg total) by mouth daily at 6 PM.   tetrahydrozoline 0.05 % ophthalmic solution Place 1 drop into both eyes daily as needed (dry eyes).   valsartan (DIOVAN) 320 MG tablet Take 1 tablet (320 mg total) by mouth daily. (Patient taking differently: Take 320 mg by mouth every evening.)   vitamin E 400 UNIT capsule Take  400 Units by mouth daily.     Allergies:   Fenofibrate and Ondansetron   Social History   Socioeconomic History   Marital status: Widowed    Spouse name: Not on file   Number of children: Not on file   Years of education: Not on file   Highest education level: Not on file  Occupational History   Not on file  Tobacco Use   Smoking status: Never   Smokeless tobacco: Never  Vaping Use   Vaping Use: Never used  Substance and Sexual Activity   Alcohol use: Not Currently   Drug use: No   Sexual activity: Not Currently    Birth control/protection: None  Other Topics Concern   Not on file  Social History Narrative   Not on file   Social Determinants of Health   Financial Resource Strain: Not on file  Food Insecurity: Not on file  Transportation Needs: Not on file  Physical Activity: Not on file  Stress: Not on file  Social Connections: Not on file     Family History: The patient's family history includes Cancer in her brother; Heart disease in her mother.  ROS:   Please see the history of present illness.    No medication side effects.  Hydralazine has been discontinued.  She monitors blood pressure at home.  It tends to run between 120 and 749 mmHg systolic.  All other systems reviewed and are  negative.  EKGs/Labs/Other Studies Reviewed:    The following studies were reviewed today: No new data  EKG:  EKG AV sequential pacing noted on tracing performed 01/21/2022  Recent Labs: 12/24/2021: BUN 14; Creatinine, Ser 0.69; Hemoglobin 11.4; Magnesium 2.3; Platelets 231; Potassium 4.4; Sodium 135  Recent Lipid Panel    Component Value Date/Time   CHOL 124 12/08/2020 0307   TRIG 115 12/08/2020 0307   HDL 57 12/08/2020 0307   CHOLHDL 2.2 12/08/2020 0307   VLDL 23 12/08/2020 0307   LDLCALC 44 12/08/2020 0307    Physical Exam:    VS:  BP 102/68   Pulse 77   Ht '5\' 4"'$  (1.626 m)   Wt 188 lb 12.8 oz (85.6 kg)   SpO2 97%   BMI 32.41 kg/m     Wt Readings from Last 3 Encounters:  02/03/22 188 lb 12.8 oz (85.6 kg)  01/21/22 186 lb (84.4 kg)  12/24/21 185 lb (83.9 kg)     GEN: Overweight. No acute distress HEENT: Normal NECK: No JVD. LYMPHATICS: No lymphadenopathy CARDIAC: No murmur. RRR no gallop, or edema. VASCULAR:  Normal Pulses. No bruits. RESPIRATORY:  Clear to auscultation without rales, wheezing or rhonchi  ABDOMEN: Soft, non-tender, non-distended, No pulsatile mass, MUSCULOSKELETAL: No deformity  SKIN: Warm and dry NEUROLOGIC:  Alert and oriented x 3 PSYCHIATRIC:  Normal affect   ASSESSMENT:    1. Paroxysmal atrial fibrillation (HCC)   2. Sinus node dysfunction (HCC)   3. Pacemaker   4. Hypertension, unspecified type   5. Bifascicular bundle branch block   6. Coronary artery disease involving native coronary artery of native heart without angina pectoris    PLAN:    In order of problems listed above:  On dofetilide with suppression.  Recurrence of atrial fibrillation is being monitored by her pacemaker device histograms. Continue to follow-up in device clinic Device clinic under the control/advice of Dr. Lovena Le. Excellent blood pressure control despite hydralazine being discontinued.  May also at some point be able to decrease intensity of  Diovan. No  comments Continue aggressive risk factor modification with high intensity statin therapy.  LDL target less than 70.  Most recent LDL was 63.  Overall education and awareness concerning primary/secondary risk prevention was discussed in detail: LDL less than 70, hemoglobin A1c less than 7, blood pressure target less than 130/80 mmHg, >150 minutes of moderate aerobic activity per week, avoidance of smoking, weight control (via diet and exercise), and continued surveillance/management of/for obstructive sleep apnea.   Chandrashekar.   Medication Adjustments/Labs and Tests Ordered: Current medicines are reviewed at length with the patient today.  Concerns regarding medicines are outlined above.  No orders of the defined types were placed in this encounter.  No orders of the defined types were placed in this encounter.   Patient Instructions  Medication Instructions:  Your physician recommends that you continue on your current medications as directed. Please refer to the Current Medication list given to you today.  *If you need a refill on your cardiac medications before your next appointment, please call your pharmacy*  Follow-Up: At [facility], you and your health needs are our priority.  As part of our continuing mission to provide you with exceptional heart care, we have created designated Provider Care Teams.  These Care Teams include your primary Cardiologist (physician) and Advanced Practice Providers (APPs -  Physician Assistants and Nurse Practitioners) who all work together to provide you with the care you need, when you need it.  Your next appointment:   1 year(s)  The format for your next appointment:   In Person  Provider:   Rudean Haskell, MD  Important Information About Sugar         Signed, Sinclair Grooms, MD  02/03/2022 9:24 AM    [place]

## 2022-02-03 ENCOUNTER — Ambulatory Visit: Payer: PPO | Attending: Interventional Cardiology | Admitting: Interventional Cardiology

## 2022-02-03 ENCOUNTER — Encounter: Payer: Self-pay | Admitting: Interventional Cardiology

## 2022-02-03 VITALS — BP 102/68 | HR 77 | Ht 64.0 in | Wt 188.8 lb

## 2022-02-03 DIAGNOSIS — I10 Essential (primary) hypertension: Secondary | ICD-10-CM | POA: Diagnosis not present

## 2022-02-03 DIAGNOSIS — I2510 Atherosclerotic heart disease of native coronary artery without angina pectoris: Secondary | ICD-10-CM | POA: Diagnosis not present

## 2022-02-03 DIAGNOSIS — I480 Paroxysmal atrial fibrillation: Secondary | ICD-10-CM | POA: Diagnosis not present

## 2022-02-03 DIAGNOSIS — I452 Bifascicular block: Secondary | ICD-10-CM | POA: Diagnosis not present

## 2022-02-03 DIAGNOSIS — Z950 Presence of cardiac pacemaker: Secondary | ICD-10-CM

## 2022-02-03 DIAGNOSIS — I495 Sick sinus syndrome: Secondary | ICD-10-CM

## 2022-02-03 NOTE — Patient Instructions (Signed)
Medication Instructions:  Your physician recommends that you continue on your current medications as directed. Please refer to the Current Medication list given to you today.  *If you need a refill on your cardiac medications before your next appointment, please call your pharmacy*  Follow-Up: At [facility], you and your health needs are our priority.  As part of our continuing mission to provide you with exceptional heart care, we have created designated Provider Care Teams.  These Care Teams include your primary Cardiologist (physician) and Advanced Practice Providers (APPs -  Physician Assistants and Nurse Practitioners) who all work together to provide you with the care you need, when you need it.  Your next appointment:   1 year(s)  The format for your next appointment:   In Person  Provider:   Rudean Haskell, MD  Important Information About Sugar

## 2022-02-09 ENCOUNTER — Encounter: Payer: PPO | Admitting: Internal Medicine

## 2022-03-24 DIAGNOSIS — I4891 Unspecified atrial fibrillation: Secondary | ICD-10-CM | POA: Diagnosis not present

## 2022-03-24 DIAGNOSIS — K219 Gastro-esophageal reflux disease without esophagitis: Secondary | ICD-10-CM | POA: Diagnosis not present

## 2022-03-24 DIAGNOSIS — E1169 Type 2 diabetes mellitus with other specified complication: Secondary | ICD-10-CM | POA: Diagnosis not present

## 2022-03-24 DIAGNOSIS — E782 Mixed hyperlipidemia: Secondary | ICD-10-CM | POA: Diagnosis not present

## 2022-03-24 DIAGNOSIS — I10 Essential (primary) hypertension: Secondary | ICD-10-CM | POA: Diagnosis not present

## 2022-03-30 DIAGNOSIS — H7202 Central perforation of tympanic membrane, left ear: Secondary | ICD-10-CM | POA: Diagnosis not present

## 2022-03-30 DIAGNOSIS — H608X3 Other otitis externa, bilateral: Secondary | ICD-10-CM | POA: Diagnosis not present

## 2022-03-30 DIAGNOSIS — E1169 Type 2 diabetes mellitus with other specified complication: Secondary | ICD-10-CM | POA: Diagnosis not present

## 2022-03-30 DIAGNOSIS — D649 Anemia, unspecified: Secondary | ICD-10-CM | POA: Diagnosis not present

## 2022-04-01 DIAGNOSIS — L820 Inflamed seborrheic keratosis: Secondary | ICD-10-CM | POA: Diagnosis not present

## 2022-04-01 DIAGNOSIS — L821 Other seborrheic keratosis: Secondary | ICD-10-CM | POA: Diagnosis not present

## 2022-04-01 DIAGNOSIS — Z789 Other specified health status: Secondary | ICD-10-CM | POA: Diagnosis not present

## 2022-04-01 DIAGNOSIS — L814 Other melanin hyperpigmentation: Secondary | ICD-10-CM | POA: Diagnosis not present

## 2022-04-01 DIAGNOSIS — D225 Melanocytic nevi of trunk: Secondary | ICD-10-CM | POA: Diagnosis not present

## 2022-04-01 DIAGNOSIS — L538 Other specified erythematous conditions: Secondary | ICD-10-CM | POA: Diagnosis not present

## 2022-04-01 DIAGNOSIS — L298 Other pruritus: Secondary | ICD-10-CM | POA: Diagnosis not present

## 2022-04-01 DIAGNOSIS — R208 Other disturbances of skin sensation: Secondary | ICD-10-CM | POA: Diagnosis not present

## 2022-04-05 DIAGNOSIS — H401131 Primary open-angle glaucoma, bilateral, mild stage: Secondary | ICD-10-CM | POA: Diagnosis not present

## 2022-04-05 DIAGNOSIS — H52223 Regular astigmatism, bilateral: Secondary | ICD-10-CM | POA: Diagnosis not present

## 2022-04-05 DIAGNOSIS — H40013 Open angle with borderline findings, low risk, bilateral: Secondary | ICD-10-CM | POA: Diagnosis not present

## 2022-04-05 DIAGNOSIS — H524 Presbyopia: Secondary | ICD-10-CM | POA: Diagnosis not present

## 2022-04-05 DIAGNOSIS — H5203 Hypermetropia, bilateral: Secondary | ICD-10-CM | POA: Diagnosis not present

## 2022-04-05 DIAGNOSIS — H5340 Unspecified visual field defects: Secondary | ICD-10-CM | POA: Diagnosis not present

## 2022-04-05 DIAGNOSIS — H40053 Ocular hypertension, bilateral: Secondary | ICD-10-CM | POA: Diagnosis not present

## 2022-04-07 ENCOUNTER — Other Ambulatory Visit: Payer: Self-pay | Admitting: Family Medicine

## 2022-04-07 DIAGNOSIS — Z1231 Encounter for screening mammogram for malignant neoplasm of breast: Secondary | ICD-10-CM

## 2022-04-15 ENCOUNTER — Ambulatory Visit (INDEPENDENT_AMBULATORY_CARE_PROVIDER_SITE_OTHER): Payer: PPO

## 2022-04-15 ENCOUNTER — Telehealth: Payer: Self-pay | Admitting: Internal Medicine

## 2022-04-15 ENCOUNTER — Encounter (HOSPITAL_COMMUNITY): Payer: Self-pay | Admitting: *Deleted

## 2022-04-15 DIAGNOSIS — I495 Sick sinus syndrome: Secondary | ICD-10-CM | POA: Diagnosis not present

## 2022-04-15 DIAGNOSIS — H90A32 Mixed conductive and sensorineural hearing loss, unilateral, left ear with restricted hearing on the contralateral side: Secondary | ICD-10-CM | POA: Diagnosis not present

## 2022-04-15 LAB — CUP PACEART REMOTE DEVICE CHECK
Battery Remaining Longevity: 121 mo
Battery Voltage: 3.12 V
Brady Statistic AP VP Percent: 97.85 %
Brady Statistic AP VS Percent: 0.5 %
Brady Statistic AS VP Percent: 0.72 %
Brady Statistic AS VS Percent: 0.94 %
Brady Statistic RA Percent Paced: 98.4 %
Brady Statistic RV Percent Paced: 98.57 %
Date Time Interrogation Session: 20240208041925
Implantable Lead Connection Status: 753985
Implantable Lead Connection Status: 753985
Implantable Lead Implant Date: 20230807
Implantable Lead Implant Date: 20230807
Implantable Lead Location: 753859
Implantable Lead Location: 753860
Implantable Lead Model: 3830
Implantable Lead Model: 5076
Implantable Pulse Generator Implant Date: 20230807
Lead Channel Impedance Value: 399 Ohm
Lead Channel Impedance Value: 475 Ohm
Lead Channel Impedance Value: 532 Ohm
Lead Channel Impedance Value: 608 Ohm
Lead Channel Pacing Threshold Amplitude: 0.5 V
Lead Channel Pacing Threshold Amplitude: 0.625 V
Lead Channel Pacing Threshold Pulse Width: 0.4 ms
Lead Channel Pacing Threshold Pulse Width: 0.4 ms
Lead Channel Sensing Intrinsic Amplitude: 17.25 mV
Lead Channel Sensing Intrinsic Amplitude: 17.25 mV
Lead Channel Sensing Intrinsic Amplitude: 4.875 mV
Lead Channel Sensing Intrinsic Amplitude: 4.875 mV
Lead Channel Setting Pacing Amplitude: 2 V
Lead Channel Setting Pacing Amplitude: 2.5 V
Lead Channel Setting Pacing Pulse Width: 0.4 ms
Lead Channel Setting Sensing Sensitivity: 1.2 mV
Zone Setting Status: 755011

## 2022-04-15 NOTE — Telephone Encounter (Signed)
Ron with [employer] states they would like to start the patient on a beta blocker and they would like to confirm whether or not Dr. Lovena Le is agreeable first. Please ask to speak with Ron when the call is returned.

## 2022-04-15 NOTE — Telephone Encounter (Signed)
Left message for Ron with [facility] to call the clinic.

## 2022-04-15 NOTE — Telephone Encounter (Signed)
Pt has pacemaker - ok to use eye drops.

## 2022-04-15 NOTE — Telephone Encounter (Signed)
Spoke with Sheila Gardner with [employer], he wanted to know if its okay to prescribe Sheila Gardner 0.5 or 0.2% for glaucoma. Patient stated she does have a history  bradycardia. Patient MD and nurse not in the  office, will sent to pharm D.

## 2022-04-19 NOTE — Telephone Encounter (Signed)
Spoke to Ron with [employer], relayed the message from  Pharm D :  Pt has pacemaker - ok to use eye drops.  Ron voiced understanding.

## 2022-04-20 DIAGNOSIS — H59811 Chorioretinal scars after surgery for detachment, right eye: Secondary | ICD-10-CM | POA: Diagnosis not present

## 2022-04-20 DIAGNOSIS — H401132 Primary open-angle glaucoma, bilateral, moderate stage: Secondary | ICD-10-CM | POA: Diagnosis not present

## 2022-04-20 DIAGNOSIS — H35372 Puckering of macula, left eye: Secondary | ICD-10-CM | POA: Diagnosis not present

## 2022-04-20 DIAGNOSIS — D3132 Benign neoplasm of left choroid: Secondary | ICD-10-CM | POA: Diagnosis not present

## 2022-04-20 DIAGNOSIS — H353132 Nonexudative age-related macular degeneration, bilateral, intermediate dry stage: Secondary | ICD-10-CM | POA: Diagnosis not present

## 2022-04-20 NOTE — Progress Notes (Signed)
Cardiology Office Note:    Date:  04/27/2022   ID:  Sheila Gardner, DOB [birth year], MRN [id number]  PCP:  Freada Bergeron, MD  [facility] Cardiologist:  Sinclair Grooms, MD (Inactive) Electrophysiologist:  Cristopher Peru, MD     Referring MD: Lawerance Cruel, MD   Chief Complaint:  Dizziness and Follow-up     History of Present Illness:   Sheila Gardner is a 87 y.o. female with a hx of persistent atrial fibrillation with rhythm control on Tikosyn, anticoagulation, heart murmur, hypertension, h/o syncope, osteoarthritis, dysfunction with permanent pacemaker placed 09/04/2021 for asymptomatic severe bradycardia Cristopher Peru).      Patient last saw Dr. [name] 02/03/22 and was doing well.  Patient comes in for f/u. She gets a little lightheaded at times if her BP drops and her balance is off. No other cardiac complaints.     Past Medical History:  Diagnosis Date   Arthritis    Atrial fibrillation (HCC)    Breast cancer ([place])    Cancer ([place])    Breast- rt   Cough with sputum 2016   Elevated liver function tests 05/09/2013   GERD (gastroesophageal reflux disease)    COSTOCHONDRITIS   Heart murmur    Hip pain 05/08/2014   Hyperlipidemia    Hypertension    Lipoma    back of neck   PONV (postoperative nausea and vomiting)    only after rt hip surgery   Primary osteoarthritis of left knee 11/04/2015   Primary osteoarthritis of right hip 07/30/2014   Squamous cell carcinoma of skin 08/21/2019   atypical squa. proliferation-Left corner of mouth   Current Medications: Current Meds  Medication Sig   acetaminophen (TYLENOL) 500 MG tablet Take 1,000 mg by mouth every 6 (six) hours as needed for moderate pain or mild pain.   ascorbic acid (VITAMIN C) 500 MG tablet Take 500 mg by mouth daily.   Biotin 5000 MCG TABS Take 5,000 Units by mouth daily with breakfast.   Cholecalciferol (VITAMIN D-3) 1000 UNITS CAPS Take 2,000 Units by mouth daily with breakfast.    cyanocobalamin 1000 MCG tablet Take 1,000 mcg by mouth daily.    diclofenac Sodium (VOLTAREN) 1 % GEL Apply 2 g topically daily as needed (pain).   dofetilide (TIKOSYN) 125 MCG capsule Take 1 capsule (125 mcg total) by mouth 2 (two) times daily.   ELIQUIS 5 MG TABS tablet Take 1 tablet (5 mg total) by mouth 2 (two) times daily.   ferrous sulfate 325 (65 FE) MG tablet Take 325 mg by mouth daily with breakfast.   furosemide (LASIX) 40 MG tablet Take 0.5 tablets (20 mg total) by mouth daily.   gabapentin (NEURONTIN) 300 MG capsule Take 300 mg by mouth 2 (two) times daily.   hydrALAZINE (APRESOLINE) 25 MG tablet Take 25 mg by mouth as needed.   hydrocortisone cream 1 % Apply 1 Application topically daily as needed for itching (irritation). Ultra Moisturizing   iron polysaccharides (NIFEREX) 150 MG capsule Take 150 mg by mouth 2 (two) times daily.   lidocaine (LMX) 4 % cream Apply 1 Application topically daily as needed (leg pain).   LORazepam (ATIVAN) 1 MG tablet Take 1 mg by mouth 2 (two) times daily as needed for anxiety.   magnesium oxide (MAG-OX) 400 (240 Mg) MG tablet TAKE 1 TABLET(400 MG) BY MOUTH DAILY   meclizine (ANTIVERT) 12.5 MG tablet Take 1 tablet (12.5 mg total) by mouth daily as needed  for dizziness.   Melatonin 10 MG TABS Take 10 mg by mouth at bedtime as needed (Sleep).   neomycin-polymyxin b-dexamethasone (MAXITROL) 3.5-10000-0.1 SUSP Place 1 drop into the left eye 4 (four) times daily.   Omega-3 Fatty Acids (OMEGA 3 500 PO) Take 1 tablet by mouth daily. Multi omega 3   OVER THE COUNTER MEDICATION Take 1 tablet by mouth daily. Health brain all day focus   OVER THE COUNTER MEDICATION Take 1 capsule by mouth daily. Balance of Nature/Fruits&Vegetables   pantoprazole (PROTONIX) 20 MG tablet Take 20 mg by mouth daily at 6 (six) AM.   potassium chloride (KLOR-CON) 10 MEQ tablet Take 2 tablets (20 mEq total) by mouth daily.   Probiotic Product ([employer]S PROBIOTIC) CHEW Chew 1 tablet by mouth  daily at 6 (six) AM.   pyridOXINE (VITAMIN B-6) 100 MG tablet Take 100 mg by mouth daily.   rosuvastatin (CRESTOR) 40 MG tablet Take 1 tablet (40 mg total) by mouth daily at 6 PM.   tetrahydrozoline 0.05 % ophthalmic solution Place 1 drop into both eyes daily as needed (dry eyes).   timolol (TIMOPTIC) 0.25 % ophthalmic solution 1 drop. In affected eye twice per day   valsartan (DIOVAN) 320 MG tablet Take 1 tablet (320 mg total) by mouth daily. (Patient taking differently: Take 320 mg by mouth every evening.)   vitamin E 400 UNIT capsule Take 400 Units by mouth daily.   [DISCONTINUED] furosemide (LASIX) 40 MG tablet TAKE 1 TABLET(40 MG) BY MOUTH DAILY   [DISCONTINUED] potassium chloride (KLOR-CON) 10 MEQ tablet Take 40 mEq by mouth daily.    Allergies:   Fenofibrate and Ondansetron   Social History   Tobacco Use   Smoking status: Never   Smokeless tobacco: Never  Vaping Use   Vaping Use: Never used  Substance Use Topics   Alcohol use: Not Currently   Drug use: No    Family Hx: The patient's family history includes Cancer in her brother; Heart disease in her mother.  ROS     Physical Exam:    VS:  BP 108/66   Pulse 75   Ht 5' 4"$  (1.626 m)   Wt 187 lb 9.6 oz (85.1 kg)   SpO2 98%   BMI 32.20 kg/m     Wt Readings from Last 3 Encounters:  04/27/22 187 lb 9.6 oz (85.1 kg)  02/03/22 188 lb 12.8 oz (85.6 kg)  01/21/22 186 lb (84.4 kg)    Physical Exam  GEN: Well nourished, well developed, in no acute distress  Neck: no JVD, carotid bruits, or masses Cardiac:RRR; no murmurs, rubs, or gallops  Respiratory:  clear to auscultation bilaterally, normal work of breathing GI: soft, nontender, nondistended, + BS Ext: without cyanosis, clubbing, or edema, Good distal pulses bilaterally Neuro:  Alert and Oriented x 3,  Psych: euthymic mood, full affect        EKGs/Labs/Other Test Reviewed:    EKG:  EKG is  not ordered today.    Recent Labs: 12/24/2021: BUN 14; Creatinine,  Ser 0.69; Hemoglobin 11.4; Magnesium 2.3; Platelets 231; Potassium 4.4; Sodium 135   Recent Lipid Panel No results for input(s): "CHOL", "TRIG", "HDL", "VLDL", "LDLCALC", "LDLDIRECT" in the last 8760 hours.   Prior CV Studies:   Cardiac cath 12/08/2020   Dist Cx lesion is 50% stenosed.   Mid LAD-1 lesion is 40% stenosed with 50% stenosed side branch in 2nd Diag.   Dist LAD lesion is 30% stenosed.   Mid LAD-2 lesion is  30% stenosed.   Previously placed Dist LM to Mid LAD stent (unknown type) is  widely patent.   The left ventricular systolic function is normal.   LV end diastolic pressure is normal.   The left ventricular ejection fraction is 55-65% by visual estimate.   Widely patent ostial to proximal LAD stent at site of prior atherectomy/DES stenting with 40 to 50% LAD stenoses beyond the stented segment and 30% mid and mid distal stenosis; large codominant left circumflex vessel with 50% stenosis after the OM 2 vessel in the AV groove circumflex and normal RCA.   Normal LV function without focal segmental wall motion abnormalities with EF estimated 55 to 60%.  LVEDP 14 mmHg   RECOMMENDATION: Eliquis can be resumed in a.m.  Medical therapy for concomitant CAD.  Rest of lipid-lowering therapy with target LDL less than 70.    Risk Assessment/Calculations/Metrics:    CHA2DS2-VASc Score = 6   This indicates a 9.7% annual risk of stroke. The patient's score is based upon: CHF History: 1 HTN History: 1 Diabetes History: 0 Stroke History: 0 Vascular Disease History: 1 Age Score: 2 Gender Score: 1             ASSESSMENT & PLAN:   No problem-specific Assessment & Plan notes found for this encounter.   PAF on dofetilide and eliquis. No complaints and HR regular  Status post pacemaker 08/2021 for severe bradycardia followed by Dr. Lovena Le  Hypertension BP has been dropping. Will decrease lasix 20 mg daily, K 20 meq daily. Consider decreasing valsartan if BP still low at  f/u.  CAD orbital atherectomy and stent to the proximal and mid LAD 04/2019, cardiac cath 12/08/2020 widely patent ostial to proximal LAD stent with 40 to 50% LAD stenosis beyond the stented segment and 30% mid and mid distal stenosis, 50% large codominant left circumflex after OM 2, LVEF 55 to 65%. No angina            Dispo:  No follow-ups on file.   Medication Adjustments/Labs and Tests Ordered: Current medicines are reviewed at length with the patient today.  Concerns regarding medicines are outlined above.  Tests Ordered: No orders of the defined types were placed in this encounter.  Medication Changes: Meds ordered this encounter  Medications   furosemide (LASIX) 40 MG tablet    Sig: Take 0.5 tablets (20 mg total) by mouth daily.    Dispense:  45 tablet    Refill:  3   potassium chloride (KLOR-CON) 10 MEQ tablet    Sig: Take 2 tablets (20 mEq total) by mouth daily.    Dispense:  180 tablet    Refill:  3   Signed, Ermalinda Barrios, PA-C  04/27/2022 1:02 PM    [address], [place], [place]  [address] Phone: [phone]; Fax: [phone]

## 2022-04-22 DIAGNOSIS — N950 Postmenopausal bleeding: Secondary | ICD-10-CM | POA: Diagnosis not present

## 2022-04-22 DIAGNOSIS — R3989 Other symptoms and signs involving the genitourinary system: Secondary | ICD-10-CM | POA: Diagnosis not present

## 2022-04-22 DIAGNOSIS — N8110 Cystocele, unspecified: Secondary | ICD-10-CM | POA: Diagnosis not present

## 2022-04-22 DIAGNOSIS — N952 Postmenopausal atrophic vaginitis: Secondary | ICD-10-CM | POA: Diagnosis not present

## 2022-04-22 DIAGNOSIS — N949 Unspecified condition associated with female genital organs and menstrual cycle: Secondary | ICD-10-CM | POA: Diagnosis not present

## 2022-04-27 ENCOUNTER — Ambulatory Visit: Payer: PPO | Attending: Physician Assistant | Admitting: Physician Assistant

## 2022-04-27 ENCOUNTER — Encounter: Payer: Self-pay | Admitting: Physician Assistant

## 2022-04-27 VITALS — BP 108/66 | HR 75 | Ht 64.0 in | Wt 187.6 lb

## 2022-04-27 DIAGNOSIS — I480 Paroxysmal atrial fibrillation: Secondary | ICD-10-CM | POA: Diagnosis not present

## 2022-04-27 DIAGNOSIS — I2510 Atherosclerotic heart disease of native coronary artery without angina pectoris: Secondary | ICD-10-CM | POA: Diagnosis not present

## 2022-04-27 DIAGNOSIS — I10 Essential (primary) hypertension: Secondary | ICD-10-CM

## 2022-04-27 DIAGNOSIS — Z950 Presence of cardiac pacemaker: Secondary | ICD-10-CM

## 2022-04-27 MED ORDER — FUROSEMIDE 40 MG PO TABS: 20.0000 mg | ORAL_TABLET | Freq: Every day | ORAL | 3 refills | Status: DC

## 2022-04-27 MED ORDER — POTASSIUM CHLORIDE ER 10 MEQ PO TBCR: 20.0000 meq | EXTENDED_RELEASE_TABLET | Freq: Every day | ORAL | 3 refills | Status: DC

## 2022-04-27 NOTE — Patient Instructions (Signed)
Medication Instructions:  Your physician has recommended you make the following change in your medication:  1. DECREASE LASIX TO 20 MG (1/2 TABLET) DAILY. 2. DECREASE POTASSIUM TO 2 TABLETS DAILY. *If you need a refill on your cardiac medications before your next appointment, please call your pharmacy*   Lab Work: NONE If you have labs (blood work) drawn today and your tests are completely normal, you will receive your results only by: [place] (if you have MyChart) OR A paper copy in the mail If you have any lab test that is abnormal or we need to change your treatment, we will call you to review the results.   Testing/Procedures: NONE   Follow-Up: At [facility], you and your health needs are our priority.  As part of our continuing mission to provide you with exceptional heart care, we have created designated Provider Care Teams.  These Care Teams include your primary Cardiologist (physician) and Advanced Practice Providers (APPs -  Physician Assistants and Nurse Practitioners) who all work together to provide you with the care you need, when you need it.  We recommend signing up for the patient portal called "MyChart".  Sign up information is provided on this After Visit Summary.  MyChart is used to connect with patients for Virtual Visits (Telemedicine).  Patients are able to view lab/test results, encounter notes, upcoming appointments, etc.  Non-urgent messages can be sent to your provider as well.   To learn more about what you can do with MyChart, go to [email or web address].    Your next appointment:   3 week(s)  Provider:   Ermalinda Barrios, PA-C

## 2022-05-04 ENCOUNTER — Telehealth: Payer: Self-pay

## 2022-05-04 DIAGNOSIS — N950 Postmenopausal bleeding: Secondary | ICD-10-CM | POA: Diagnosis not present

## 2022-05-04 NOTE — Telephone Encounter (Signed)
Remote transmission received.  Pt has a dual chamber pacemaker programmed DDDR at 70 BPM.  She is concerned that her heart rate is too fast, thinks it is making her dizzy and nauseas.   States her heart rate gets as high as 96 BPM.  Advised that that was a normal heart rate.  This nurse advised that it did not sound like her symptoms were related to her pacemaker, but could lower her base rate to 60 BPM to see if that helped with symptoms.      Would not advise making multiple changes at same time.  If lowering base rate to 60 did not improve symptoms could consider turning off rate response.  If either of those changes do not improve symptoms-would confirm symptoms not related to pacemaker.  Pt would like to turn base rate to 60 BPM.   Will confirm with Dr. Lovena Le.  Pt will come to device clinic this afternoon for reprogramming.

## 2022-05-04 NOTE — Telephone Encounter (Signed)
Patient called in concerned with her heart rate being high. Patient has been dizzy and nauseated for a few weeks now. Patient is sending in a transmission to see if anything abnormal is seen. Patient wants to be seen for this because she is concerned it has something to do with her ppm. Patient would like a call back

## 2022-05-04 NOTE — Telephone Encounter (Signed)
Pt arrived for reprogramming.  Base rate decreased to 60 BPM.  Advised Pt would check in with her next week to see if she has noticed an improvement in symptoms.  If no improvement would consider turning of rate response.

## 2022-05-04 NOTE — Progress Notes (Signed)
Cardiology Office Note:    Date:  05/11/2022   ID:  Sheila Gardner, DOB 06/18/1935, MRN [id number]  PCP:  Sheila Bergeron, MD  Sheila Gardner Cardiologist:  Sheila Grooms, MD (Inactive) Electrophysiologist:  Sheila Peru, MD     Referring MD: Sheila Bergeron, MD   Chief Complaint:  Hypotension     History of Present Illness:   Sheila Gardner is a 87 y.o. female with a hx of persistent atrial fibrillation with rhythm control on Tikosyn, anticoagulation, heart murmur, hypertension, h/o syncope, osteoarthritis, dysfunction with permanent pacemaker placed 09/04/2021 for asymptomatic severe bradycardia Sheila Gardner).      Patient last saw Dr. Tamala Gardner 02/03/22 and was doing well.  I saw the patient 04/27/22 with hypotension and decreased her lasix 20 mg daily with plans to decrease valsartan if still low at f/u.   05/04/22 Dr. Lovena Le dropped her base rate to 60 bpm and HR going up to 83 but doesn't make her feel bad. Still gets dizzy at times if she gets up quickly but some better.            Past Medical History:  Diagnosis Date   Arthritis    Atrial fibrillation (HCC)    Breast cancer ([place])    Cancer ([place])    Breast- rt   Cough with sputum 2016   Elevated liver function tests 05/09/2013   GERD (gastroesophageal reflux disease)    COSTOCHONDRITIS   Heart murmur    Hip pain 05/08/2014   Hyperlipidemia    Hypertension    Lipoma    back of neck   PONV (postoperative nausea and vomiting)    only after rt hip surgery   Primary osteoarthritis of left knee 11/04/2015   Primary osteoarthritis of right hip 07/30/2014   Squamous cell carcinoma of skin 08/21/2019   atypical squa. proliferation-Left corner of mouth   Current Medications: Current Meds  Medication Sig   acetaminophen (TYLENOL) 500 MG tablet Take 1,000 mg by mouth every 6 (six) hours as needed for moderate pain or mild pain.   ascorbic acid (VITAMIN C) 500 MG tablet Take 500 mg by mouth daily.    Biotin 5000 MCG TABS Take 5,000 Units by mouth daily with breakfast.   Cholecalciferol (VITAMIN D-3) 1000 UNITS CAPS Take 2,000 Units by mouth daily with breakfast.   cyanocobalamin 1000 MCG tablet Take 1,000 mcg by mouth daily.    diclofenac Sodium (VOLTAREN) 1 % GEL Apply 2 g topically daily as needed (pain).   dofetilide (TIKOSYN) 125 MCG capsule Take 1 capsule (125 mcg total) by mouth 2 (two) times daily.   ELIQUIS 5 MG TABS tablet Take 1 tablet (5 mg total) by mouth 2 (two) times daily.   ferrous sulfate 325 (65 FE) MG tablet Take 325 mg by mouth daily with breakfast.   furosemide (LASIX) 40 MG tablet Take 20 mg by mouth as needed (Increased weight gain).   gabapentin (NEURONTIN) 300 MG capsule Take 300 mg by mouth 2 (two) times daily.   hydrALAZINE (APRESOLINE) 25 MG tablet TAKE 1 TABLET(25 MG) BY MOUTH IN THE MORNING AND AT BEDTIME   hydrALAZINE (APRESOLINE) 25 MG tablet Take 25 mg by mouth as needed.   hydrocortisone cream 1 % Apply 1 Application topically daily as needed for itching (irritation). Ultra Moisturizing   iron polysaccharides (NIFEREX) 150 MG capsule Take 150 mg by mouth 2 (two) times daily.   lidocaine (LMX) 4 % cream Apply 1 Application  topically daily as needed (leg pain).   LORazepam (ATIVAN) 1 MG tablet Take 1 mg by mouth 2 (two) times daily as needed for anxiety.   magnesium oxide (MAG-OX) 400 (240 Mg) MG tablet TAKE 1 TABLET(400 MG) BY MOUTH DAILY   meclizine (ANTIVERT) 12.5 MG tablet Take 1 tablet (12.5 mg total) by mouth daily as needed for dizziness.   Melatonin 10 MG TABS Take 10 mg by mouth at bedtime as needed (Sleep).   neomycin-polymyxin b-dexamethasone (MAXITROL) 3.5-10000-0.1 SUSP Place 1 drop into the left eye 4 (four) times daily.   Omega-3 Fatty Acids (OMEGA 3 500 PO) Take 1 tablet by mouth daily. Multi omega 3   OVER THE COUNTER MEDICATION Take 1 tablet by mouth daily. Health brain all day focus   OVER THE COUNTER MEDICATION Take 1 capsule by mouth  daily. Balance of Nature/Fruits&Vegetables   pantoprazole (PROTONIX) 20 MG tablet Take 20 mg by mouth daily at 6 (six) AM.   potassium chloride (KLOR-CON) 10 MEQ tablet Take 10 mEq by mouth as needed (Increased weight gain).   Probiotic Product ([employer] PROBIOTIC) CHEW Chew 1 tablet by mouth daily at 6 (six) AM.   pyridOXINE (VITAMIN B-6) 100 MG tablet Take 100 mg by mouth daily.   rosuvastatin (CRESTOR) 40 MG tablet Take 1 tablet (40 mg total) by mouth daily at 6 PM.   tetrahydrozoline 0.05 % ophthalmic solution Place 1 drop into both eyes daily as needed (dry eyes).   timolol (TIMOPTIC) 0.25 % ophthalmic solution 1 drop. In affected eye twice per day   valsartan (DIOVAN) 320 MG tablet Take 1 tablet (320 mg total) by mouth daily. (Patient taking differently: Take 320 mg by mouth every evening.)   vitamin E 400 UNIT capsule Take 400 Units by mouth daily.   [DISCONTINUED] furosemide (LASIX) 40 MG tablet Take 0.5 tablets (20 mg total) by mouth daily.   [DISCONTINUED] potassium chloride (KLOR-CON) 10 MEQ tablet Take 2 tablets (20 mEq total) by mouth daily.    Allergies:   Fenofibrate and Ondansetron   Social History   Tobacco Use   Smoking status: Never   Smokeless tobacco: Never  Vaping Use   Vaping Use: Never used  Substance Use Topics   Alcohol use: Not Currently   Drug use: No    Family Hx: The patient's family history includes Cancer in her brother; Heart disease in her mother.  ROS     Physical Exam:    VS:  BP 110/60   Pulse 84   Ht '5\' 4"'$  (1.626 m)   Wt 186 lb 9.6 oz (84.6 kg)   SpO2 94%   BMI 32.03 kg/m     Wt Readings from Last 3 Encounters:  05/11/22 186 lb 9.6 oz (84.6 kg)  04/27/22 187 lb 9.6 oz (85.1 kg)  02/03/22 188 lb 12.8 oz (85.6 kg)    Physical Exam  GEN: Well nourished, well developed, in no acute distress  Neck: no JVD, carotid bruits, or masses Cardiac:RRR; no murmurs, rubs, or gallops  Respiratory:  clear to auscultation bilaterally, normal work of  breathing GI: soft, nontender, nondistended, + BS Ext: without cyanosis, clubbing, or edema, Good distal pulses bilaterally Neuro:  Alert and Oriented x 3, Psych: euthymic mood, full affect        EKGs/Labs/Other Test Reviewed:    EKG:  EKG is not ordered today.     Recent Labs: 12/24/2021: BUN 14; Creatinine, Ser 0.69; Hemoglobin 11.4; Magnesium 2.3; Platelets 231; Potassium 4.4; Sodium 135  Recent Lipid Panel No results for input(s): "CHOL", "TRIG", "HDL", "VLDL", "LDLCALC", "LDLDIRECT" in the last 8760 hours.   Prior CV Studies:   Cardiac cath 12/08/2020    Dist Cx lesion is 50% stenosed.   Mid LAD-1 lesion is 40% stenosed with 50% stenosed side branch in 2nd Diag.   Dist LAD lesion is 30% stenosed.   Mid LAD-2 lesion is 30% stenosed.   Previously placed Dist LM to Mid LAD stent (unknown type) is  widely patent.   The left ventricular systolic function is normal.   LV end diastolic pressure is normal.   The left ventricular ejection fraction is 55-65% by visual estimate.   Widely patent ostial to proximal LAD stent at site of prior atherectomy/DES stenting with 40 to 50% LAD stenoses beyond the stented segment and 30% mid and mid distal stenosis; large codominant left circumflex vessel with 50% stenosis after the OM 2 vessel in the AV groove circumflex and normal RCA.   Normal LV function without focal segmental wall motion abnormalities with EF estimated 55 to 60%.  LVEDP 14 mmHg   RECOMMENDATION: Eliquis can be resumed in a.m.  Medical therapy for concomitant CAD.  Rest of lipid-lowering therapy with target LDL less than 70.     Risk Assessment/Calculations/Metrics:    CHA2DS2-VASc Score = 6   This indicates a 9.7% annual risk of stroke. The patient's score is based upon: CHF History: 1 HTN History: 1 Diabetes History: 0 Stroke History: 0 Vascular Disease History: 1 Age Score: 2 Gender Score: 1             ASSESSMENT & PLAN:   No problem-specific  Assessment & Plan notes found for this encounter.  Dizziness with lower BP's-stop lasix and K. Check cmet and CBC today. F/u in 4-6 weeks.   PAF on dofetilide and eliquis. No complaints and HR regular   Status post pacemaker 08/2021 for severe bradycardia followed by Dr. Georgia Lopes base rate decreased to 60 bpm and feeling much better.    Hypertension BP has been dropping. Will stop  lasix  and K and take prn for weight gain/edema. Consider decreasing valsartan if BP still low at f/u.   CAD orbital atherectomy and stent to the proximal and mid LAD 04/2019, cardiac cath 12/08/2020 widely patent ostial to proximal LAD stent with 40 to 50% LAD stenosis beyond the stented segment and 30% mid and mid distal stenosis, 50% large codominant left circumflex after OM 2, LVEF 55 to 65%. No angina              Dispo:  No follow-ups on file.   Medication Adjustments/Labs and Tests Ordered: Current medicines are reviewed at length with the patient today.  Concerns regarding medicines are outlined above.  Tests Ordered: Orders Placed This Encounter  Procedures   Comprehensive metabolic panel   CBC   Medication Changes: No orders of the defined types were placed in this encounter.  Sumner Boast, PA-C  05/11/2022 12:00 PM    [address], [place], [place]  [address] Phone: [phone]; Fax: [phone]

## 2022-05-04 NOTE — Telephone Encounter (Signed)
Discussed with Dr. Lovena Le.  Ok to drop base rate to 60 BPM.

## 2022-05-10 NOTE — Progress Notes (Signed)
Remote pacemaker transmission.   

## 2022-05-11 ENCOUNTER — Encounter: Payer: Self-pay | Admitting: Physician Assistant

## 2022-05-11 ENCOUNTER — Ambulatory Visit: Payer: PPO | Attending: Physician Assistant | Admitting: Physician Assistant

## 2022-05-11 VITALS — BP 110/60 | HR 84 | Ht 64.0 in | Wt 186.6 lb

## 2022-05-11 DIAGNOSIS — I952 Hypotension due to drugs: Secondary | ICD-10-CM | POA: Diagnosis not present

## 2022-05-11 DIAGNOSIS — I10 Essential (primary) hypertension: Secondary | ICD-10-CM | POA: Diagnosis not present

## 2022-05-11 DIAGNOSIS — I2510 Atherosclerotic heart disease of native coronary artery without angina pectoris: Secondary | ICD-10-CM

## 2022-05-11 DIAGNOSIS — R42 Dizziness and giddiness: Secondary | ICD-10-CM

## 2022-05-11 DIAGNOSIS — Z950 Presence of cardiac pacemaker: Secondary | ICD-10-CM | POA: Diagnosis not present

## 2022-05-11 NOTE — Patient Instructions (Addendum)
Medication Instructions:  Decrease Lasix to half a tablet as needed for increased weight gain Decrease Potassium to 1 tablet as needed for increased weigh gain   *If you need a refill on your cardiac medications before your next appointment, please call your pharmacy*   Lab Work: CMP, CBC- today   If you have labs (blood work) drawn today and your tests are completely normal, you will receive your results only by: [place] (if you have MyChart) OR A paper copy in the mail If you have any lab test that is abnormal or we need to change your treatment, we will call you to review the results.   Testing/Procedures: None ordered    Follow-Up: Follow up as scheduled   Other Instructions

## 2022-05-12 LAB — COMPREHENSIVE METABOLIC PANEL
ALT: 16 IU/L (ref 0–32)
AST: 23 IU/L (ref 0–40)
Albumin/Globulin Ratio: 1.9 (ref 1.2–2.2)
Albumin: 4.5 g/dL (ref 3.7–4.7)
Alkaline Phosphatase: 67 IU/L (ref 44–121)
BUN/Creatinine Ratio: 18 (ref 12–28)
BUN: 14 mg/dL (ref 8–27)
Bilirubin Total: 0.6 mg/dL (ref 0.0–1.2)
CO2: 25 mmol/L (ref 20–29)
Calcium: 10 mg/dL (ref 8.7–10.3)
Chloride: 96 mmol/L (ref 96–106)
Creatinine, Ser: 0.76 mg/dL (ref 0.57–1.00)
Globulin, Total: 2.4 g/dL (ref 1.5–4.5)
Glucose: 95 mg/dL (ref 70–99)
Potassium: 4.7 mmol/L (ref 3.5–5.2)
Sodium: 135 mmol/L (ref 134–144)
Total Protein: 6.9 g/dL (ref 6.0–8.5)
eGFR: 77 mL/min/{1.73_m2} (ref >59)

## 2022-05-12 LAB — CBC
Hematocrit: 33.6 % — ABNORMAL LOW (ref 34.0–46.6)
Hemoglobin: 11.4 g/dL (ref 11.1–15.9)
MCH: 31.1 pg (ref 26.6–33.0)
MCHC: 33.9 g/dL (ref 31.5–35.7)
MCV: 92 fL (ref 79–97)
Platelets: 256 10*3/uL (ref 150–450)
RBC: 3.67 x10E6/uL — ABNORMAL LOW (ref 3.77–5.28)
RDW: 12.9 % (ref 11.7–15.4)
WBC: 7.2 10*3/uL (ref 3.4–10.8)

## 2022-05-18 ENCOUNTER — Ambulatory Visit
Admission: RE | Admit: 2022-05-18 | Discharge: 2022-05-18 | Disposition: A | Discharge status: Home / Self Care | Payer: PPO | Source: Ambulatory Visit | Attending: Family Medicine | Admitting: Family Medicine

## 2022-05-18 DIAGNOSIS — E2839 Other primary ovarian failure: Secondary | ICD-10-CM

## 2022-05-18 DIAGNOSIS — Z780 Asymptomatic menopausal state: Secondary | ICD-10-CM | POA: Diagnosis not present

## 2022-05-27 ENCOUNTER — Encounter (HOSPITAL_COMMUNITY): Payer: Self-pay | Admitting: Emergency Medicine

## 2022-05-27 ENCOUNTER — Emergency Department (HOSPITAL_COMMUNITY): Payer: PPO

## 2022-05-27 ENCOUNTER — Encounter (HOSPITAL_COMMUNITY): Payer: Self-pay

## 2022-05-27 ENCOUNTER — Ambulatory Visit (HOSPITAL_COMMUNITY)
Admission: EM | Admit: 2022-05-27 | Discharge: 2022-05-27 | Disposition: A | Discharge status: Home / Self Care | Payer: PPO | Attending: Family Medicine | Admitting: Family Medicine

## 2022-05-27 ENCOUNTER — Emergency Department (HOSPITAL_COMMUNITY)
Admission: EM | Admit: 2022-05-27 | Discharge: 2022-05-27 | Disposition: A | Discharge status: Home / Self Care | Payer: PPO | Attending: Emergency Medicine | Admitting: Emergency Medicine

## 2022-05-27 ENCOUNTER — Other Ambulatory Visit: Payer: Self-pay

## 2022-05-27 DIAGNOSIS — W01198A Fall on same level from slipping, tripping and stumbling with subsequent striking against other object, initial encounter: Secondary | ICD-10-CM | POA: Insufficient documentation

## 2022-05-27 DIAGNOSIS — S0083XA Contusion of other part of head, initial encounter: Secondary | ICD-10-CM | POA: Insufficient documentation

## 2022-05-27 DIAGNOSIS — Z7901 Long term (current) use of anticoagulants: Secondary | ICD-10-CM | POA: Insufficient documentation

## 2022-05-27 DIAGNOSIS — W19XXXA Unspecified fall, initial encounter: Secondary | ICD-10-CM

## 2022-05-27 DIAGNOSIS — S0990XA Unspecified injury of head, initial encounter: Secondary | ICD-10-CM | POA: Diagnosis present

## 2022-05-27 DIAGNOSIS — M545 Low back pain, unspecified: Secondary | ICD-10-CM | POA: Diagnosis not present

## 2022-05-27 DIAGNOSIS — S60512A Abrasion of left hand, initial encounter: Secondary | ICD-10-CM | POA: Diagnosis not present

## 2022-05-27 DIAGNOSIS — S80211A Abrasion, right knee, initial encounter: Secondary | ICD-10-CM | POA: Insufficient documentation

## 2022-05-27 DIAGNOSIS — S199XXA Unspecified injury of neck, initial encounter: Secondary | ICD-10-CM | POA: Diagnosis not present

## 2022-05-27 DIAGNOSIS — S60511A Abrasion of right hand, initial encounter: Secondary | ICD-10-CM | POA: Diagnosis not present

## 2022-05-27 DIAGNOSIS — I4891 Unspecified atrial fibrillation: Secondary | ICD-10-CM | POA: Diagnosis not present

## 2022-05-27 DIAGNOSIS — G501 Atypical facial pain: Secondary | ICD-10-CM | POA: Diagnosis not present

## 2022-05-27 DIAGNOSIS — M542 Cervicalgia: Secondary | ICD-10-CM | POA: Insufficient documentation

## 2022-05-27 NOTE — ED Notes (Signed)
Patient is being discharged from the Urgent Care and sent to the Emergency Department via pov . Per Dr Windy Carina, patient is in need of higher level of care due to limited resources. Patient is aware and verbalizes understanding of plan of care.  Vitals:   05/27/22 1623  BP: (!) 139/48  Pulse: 71  Resp: 18  Temp: 98 F (36.7 C)  SpO2: 96%

## 2022-05-27 NOTE — ED Triage Notes (Signed)
Patient fell approximately 30 minutes ago. Patient was walking on concrete and stumbled.  Hematoma to right forehead.  Abrasions to knees, elbows, and hands.    Patient is on a blood thinner

## 2022-05-27 NOTE — ED Provider Notes (Signed)
Briefly in triage.  Earlier this afternoon she was walking on concrete and stumbled.  She fell forward and hit her head pretty hard.  On questioning she states maybe she briefly lost consciousness because she had so hard, but she is not sure.  She also has a couple of scrapes and bruises on her hands.  She is on Eliquis.  On exam there is a hematoma about 4 cm in diameter on her right frontal area.  She also has a couple of small abrasions on her forearms and hands.  Nothing is bleeding   I have asked her to proceed to the emergency room for higher level of evaluation and treatment then we can do here in the urgent care setting.  She is going to go by private car with her son driving.   Barrett Henle, MD 05/27/22 [phone]

## 2022-05-27 NOTE — ED Provider Triage Note (Signed)
Emergency Medicine Provider Triage Evaluation Note  Sheila Gardner , a 87 y.o. female  was evaluated in triage.  Pt complains of headache.  Patient had a mechanical fall while walking.  She has a bruise on her head and skin abrasions on her hands.  Denies nausea, vomiting, chest pain, shortness of breath.   Review of Systems  Positive: As above Negative: As above  Physical Exam  BP (!) 172/94 (BP Location: Right Arm)   Pulse 82   Temp 97.8 F (36.6 C) (Oral)   Resp 18   Ht 5\' 4"  (1.626 m)   Wt 81.6 kg   SpO2 96%   BMI 30.90 kg/m  Gen:   Awake, no distress   Resp:  Normal effort  MSK:   Moves extremities without difficulty  Other:    Medical Decision Making  Medically screening exam initiated at 5:11 PM.  Appropriate orders placed.  Sheila Gardner was informed that the remainder of the evaluation will be completed by another provider, this initial triage assessment does not replace that evaluation, and the importance of remaining in the ED until their evaluation is complete.    Rex Kras, [place] 05/27/22 [phone]

## 2022-05-27 NOTE — ED Provider Notes (Signed)
[facility] Provider Note   CSN: [id number] Arrival date & time: 05/27/22  1643     History  Chief Complaint  Patient presents with   Fall   Head Injury    Sheila Gardner is a 87 y.o. female.  Patient with history of A-fib on Eliquis presents today with complaints of fall.  She states that same occurred earlier today when she was walking on concrete and subsequently tripped on the curb and fell and struck the right side of her forehead on concrete.  She is unsure if she lost consciousness but if she did it was very brief.  She was able to get up off the ground and walk around afterwards without difficulty.  She originally went to urgent care for evaluation of her wounds and was recommended to come here given that she had a fall on anticoagulation.  Endorsing pain to the right side of her face as well as the back of her neck.  She denies any other injuries or complaints.  She does have a few abrasions on the palm of her hand but denies any wrist or hand pain.  No active bleeding.   The history is provided by the patient. No language interpreter was used.  Fall  Head Injury      Home Medications Prior to Admission medications   Medication Sig Start Date End Date Taking? Authorizing Provider  acetaminophen (TYLENOL) 500 MG tablet Take 1,000 mg by mouth every 6 (six) hours as needed for moderate pain or mild pain.    [provider]  ascorbic acid (VITAMIN C) 500 MG tablet Take 500 mg by mouth daily.    [provider]  Biotin 5000 MCG TABS Take 5,000 Units by mouth daily with breakfast.    [provider]  Cholecalciferol (VITAMIN D-3) 1000 UNITS CAPS Take 2,000 Units by mouth daily with breakfast.    [provider]  cyanocobalamin 1000 MCG tablet Take 1,000 mcg by mouth daily.     [provider]  diclofenac Sodium (VOLTAREN) 1 % GEL Apply 2 g topically daily as needed (pain).    [provider]  dofetilide (TIKOSYN) 125 MCG capsule Take 1 capsule (125 mcg total) by mouth 2 (two) times daily. 12/14/21   Evans Lance, MD  ELIQUIS 5 MG TABS tablet Take 1 tablet (5 mg total) by mouth 2 (two) times daily. 10/17/21   Shirley Friar, PA-C  ferrous sulfate 325 (65 FE) MG tablet Take 325 mg by mouth daily with breakfast.    [provider]  furosemide (LASIX) 40 MG tablet Take 20 mg by mouth as needed (Increased weight gain).    [provider]  gabapentin (NEURONTIN) 300 MG capsule Take 300 mg by mouth 2 (two) times daily. 12/02/20   [provider]  hydrALAZINE (APRESOLINE) 25 MG tablet TAKE 1 TABLET(25 MG) BY MOUTH IN THE MORNING AND AT BEDTIME 12/14/21   Imogene Burn, PA-C  hydrALAZINE (APRESOLINE) 25 MG tablet Take 25 mg by mouth as needed.    [provider]  hydrocortisone cream 1 % Apply 1 Application topically daily as needed for itching (irritation). Ultra Moisturizing    [provider]  iron polysaccharides (NIFEREX) 150 MG capsule Take 150 mg by mouth 2 (two) times daily.    [provider]  lidocaine (LMX) 4 % cream Apply 1 Application topically daily as needed (leg pain).    [provider]  LORazepam (ATIVAN) 1 MG tablet Take 1 mg by mouth 2 (two) times daily as needed for anxiety.    [provider]  magnesium oxide (MAG-OX) 400 (240 Mg) MG tablet TAKE 1 TABLET(400 MG) BY MOUTH DAILY 02/01/22   Evans Lance, MD  meclizine (ANTIVERT) 12.5 MG tablet Take 1 tablet (12.5 mg total) by mouth daily as needed for dizziness. 12/10/20   Cheryln Manly, NP  Melatonin 10 MG TABS Take 10 mg by mouth at bedtime as needed (Sleep).    [provider]  neomycin-polymyxin b-dexamethasone (MAXITROL) 3.5-10000-0.1 SUSP Place 1 drop into the left eye 4 (four) times daily. 10/09/21   [provider]  Omega-3 Fatty Acids (OMEGA 3 500 PO) Take 1 tablet by mouth daily. Multi omega 3     [provider]  OVER THE COUNTER MEDICATION Take 1 tablet by mouth daily. Health brain all day focus    [provider]  OVER THE COUNTER MEDICATION Take 1 capsule by mouth daily. Balance of Nature/Fruits&Vegetables    [provider]  pantoprazole (PROTONIX) 20 MG tablet Take 20 mg by mouth daily at 6 (six) AM.    [provider]  potassium chloride (KLOR-CON) 10 MEQ tablet Take 10 mEq by mouth as needed (Increased weight gain).    [provider]  Probiotic Product ([employer] PROBIOTIC) CHEW Chew 1 tablet by mouth daily at 6 (six) AM.    [provider]  pyridOXINE (VITAMIN B-6) 100 MG tablet Take 100 mg by mouth daily.    [provider]  rosuvastatin (CRESTOR) 40 MG tablet Take 1 tablet (40 mg total) by mouth daily at 6 PM. 11/26/19   Belva Crome, MD  tetrahydrozoline 0.05 % ophthalmic solution Place 1 drop into both eyes daily as needed (dry eyes).    [provider]  timolol (TIMOPTIC) 0.25 % ophthalmic solution 1 drop. In affected eye twice per day    [provider]  valsartan (DIOVAN) 320 MG tablet Take 1 tablet (320 mg total) by mouth daily. Patient taking differently: Take 320 mg by mouth every evening. 04/23/21   Belva Crome, MD  vitamin E 400 UNIT capsule Take 400 Units by mouth daily.    [provider]      Allergies    Fenofibrate and Ondansetron    Review of Systems   Review of Systems  All other systems reviewed and are negative.   Physical Exam Updated Vital Signs BP 102/64   Pulse 63   Temp 97.8 F (36.6 C) (Oral)   Resp 18   Ht 5\' 4"  (1.626 m)   Wt 81.6 kg   SpO2 99%   BMI 30.90 kg/m  Physical Exam Vitals and nursing note reviewed.  Constitutional:      General: She is not in acute distress.    Appearance: Normal appearance. She is normal weight. She is not ill-appearing, toxic-appearing or diaphoretic.  HENT:     Head: Normocephalic and atraumatic.     Comments:  Bruising noted to the right anterior forehead.  No crepitus or deformity. Eyes:     Extraocular Movements: Extraocular movements intact.     Pupils: Pupils are equal, round, and reactive to light.  Neck:     Comments: Tenderness to palpation of midline cervical spine.  No step-offs, lesions, deformity, or overlying skin changes. Cardiovascular:     Rate and Rhythm: Normal rate and regular rhythm.     Heart sounds: Normal heart sounds.  Pulmonary:     Effort: Pulmonary effort is normal. No respiratory distress.     Breath sounds: Normal breath sounds.  Abdominal:     General: Abdomen is flat.     Palpations: Abdomen is soft.  Musculoskeletal:        General: No tenderness. Normal range of motion.     Cervical back: Normal range of motion.     Right lower leg: No edema.     Left lower leg: No edema.     Comments: Superficial abrasion noted to the anterior right knee without active bleeding. No tenderness to palpation. Patient ambulatory with steady gait.   Superficial abrasions noted to bilateral palms without tenderness to palpation or obvious deformity. No active bleeding.  Skin:    General: Skin is warm and dry.  Neurological:     General: No focal deficit present.     Mental Status: She is alert and oriented to person, place, and time.  Psychiatric:        Mood and Affect: Mood normal.        Behavior: Behavior normal.     ED Results / Procedures / Treatments   Labs (all labs ordered are listed, but only abnormal results are displayed) Labs Reviewed - No data to display  EKG None  Radiology CT Cervical Spine Wo Contrast  Result Date: 05/27/2022 CLINICAL DATA:  Neck trauma. EXAM: CT CERVICAL SPINE WITHOUT CONTRAST TECHNIQUE: Multidetector CT imaging of the cervical spine was performed without intravenous contrast. Multiplanar CT image reconstructions were also generated. RADIATION DOSE REDUCTION: This exam was performed according to the departmental dose-optimization  program which includes automated exposure control, adjustment of the mA and/or kV according to patient size and/or use of iterative reconstruction technique. COMPARISON:  None Available. FINDINGS: Alignment: No acute subluxation. There is straightening of normal cervical lordosis which may be positional or due to muscle spasm. Skull base and vertebrae: No acute fracture.  Osteopenia. Soft tissues and spinal canal: No prevertebral fluid or swelling. No visible canal hematoma. Disc levels:  No acute findings.  Degenerative changes. Upper chest: Biapical subpleural scarring. Other: Pacemaker wires. IMPRESSION: No acute/traumatic cervical spine pathology. Electronically Signed   By: Anner Crete M.D.   On: 05/27/2022 20:04   CT Head Wo Contrast  Result Date: 05/27/2022 CLINICAL DATA:  Fall. On blood thinners. Hematoma to right forehead. EXAM: CT HEAD WITHOUT CONTRAST TECHNIQUE: Contiguous axial images were obtained from the base of the skull through the vertex without intravenous contrast. RADIATION DOSE REDUCTION: This exam was performed according to the departmental dose-optimization program which includes automated exposure control, adjustment of the mA and/or kV according to patient size and/or use of iterative reconstruction technique. COMPARISON:  10/05/2018 FINDINGS: Brain: No mass lesion, hemorrhage, hydrocephalus, acute infarct, intra-axial, or extra-axial fluid collection. Vascular: No hyperdense vessel or unexpected calcification. Skull: Mild right supraorbital scalp soft tissue swelling. No underlying skull fracture. Sinuses/Orbits: Surgical changes in the right globe. Left mastoidectomy. Hypoplastic left frontal sinus. Other: None. IMPRESSION: No acute intracranial abnormality. Electronically Signed   By: Abigail Miyamoto M.D.   On: 05/27/2022 18:14    Procedures Procedures    Medications Ordered in ED Medications - No data to display  ED Course/ Medical Decision Making/ A&P                              Medical Decision Making Amount and/or Complexity of Data Reviewed Radiology: ordered.  Patient presents today with complaints of mechanical fall earlier today.  She is anticoagulated with Eliquis.  She is afebrile, nontoxic-appearing, and in no acute distress with reassuring vital signs.  Physical exam reveals small hematoma noted to the right anterior forehead as well as midline cervical spine tenderness to palpation.  CT imaging obtained of the head and cervical spine which was unremarkable for acute findings.  I personally reviewed and interpreted this imaging and agree with radiology interpretation.  Patient observed to be ambulatory with steady gait and has no other complaints.  She is overall well-appearing and states that her pain is minimal and she would like to go home.  No indication for additional workup with laboratory evaluation at this time. Evaluation and diagnostic testing in the emergency department does not suggest an emergent condition requiring admission or immediate intervention beyond what has been performed at this time.  Plan for discharge with close PCP follow-up and recommendations for RICE with tylenol as needed for pain. Patient is understanding and amenable with plan, educated on red flag symptoms that would prompt immediate return.  Patient discharged in stable condition.  Findings and plan of care discussed with supervising physician Dr. Darl Householder who is in agreement.     Final Clinical Impression(s) / ED Diagnoses Final diagnoses:  Fall, initial encounter    Rx / DC Orders ED Discharge Orders     None     An After Visit Summary was printed and given to the patient.     Nestor Lewandowsky 05/27/22 2042    Drenda Freeze, MD 05/27/22 [phone]

## 2022-05-27 NOTE — ED Triage Notes (Signed)
Mechanical fall today. Hit head, right hematoma. No LOC, pt is on eliquis. Pt denies dizziness or any other difficulties. Just soreness to right side of head. Pt has ambulated since injury and denies any other injury. 1000mg  tylenol PTA.

## 2022-05-27 NOTE — Discharge Instructions (Signed)
As we discussed, your workup in the ER today was reassuring for acute findings.  CT imaging of your head and neck did not reveal any emergent concerns.  I recommend that you get plenty of rest and ice areas that are painful and take Tylenol as needed for pain.  Please be aware that you may feel more sore in the morning and this is to be expected.  I recommend that you follow-up with your primary care doctor as needed.  Return if development of any new or worsening symptoms.

## 2022-05-28 DIAGNOSIS — H53143 Visual discomfort, bilateral: Secondary | ICD-10-CM | POA: Diagnosis not present

## 2022-05-28 DIAGNOSIS — H43392 Other vitreous opacities, left eye: Secondary | ICD-10-CM | POA: Diagnosis not present

## 2022-05-31 DIAGNOSIS — E1169 Type 2 diabetes mellitus with other specified complication: Secondary | ICD-10-CM | POA: Diagnosis not present

## 2022-05-31 DIAGNOSIS — E559 Vitamin D deficiency, unspecified: Secondary | ICD-10-CM | POA: Diagnosis not present

## 2022-05-31 DIAGNOSIS — E782 Mixed hyperlipidemia: Secondary | ICD-10-CM | POA: Diagnosis not present

## 2022-05-31 DIAGNOSIS — D6859 Other primary thrombophilia: Secondary | ICD-10-CM | POA: Diagnosis not present

## 2022-06-02 DIAGNOSIS — H90A32 Mixed conductive and sensorineural hearing loss, unilateral, left ear with restricted hearing on the contralateral side: Secondary | ICD-10-CM | POA: Diagnosis not present

## 2022-06-03 DIAGNOSIS — F411 Generalized anxiety disorder: Secondary | ICD-10-CM | POA: Diagnosis not present

## 2022-06-03 DIAGNOSIS — I4891 Unspecified atrial fibrillation: Secondary | ICD-10-CM | POA: Diagnosis not present

## 2022-06-03 DIAGNOSIS — K219 Gastro-esophageal reflux disease without esophagitis: Secondary | ICD-10-CM | POA: Diagnosis not present

## 2022-06-03 DIAGNOSIS — W19XXXD Unspecified fall, subsequent encounter: Secondary | ICD-10-CM | POA: Diagnosis not present

## 2022-06-03 DIAGNOSIS — D6869 Other thrombophilia: Secondary | ICD-10-CM | POA: Diagnosis not present

## 2022-06-03 DIAGNOSIS — I10 Essential (primary) hypertension: Secondary | ICD-10-CM | POA: Diagnosis not present

## 2022-06-03 DIAGNOSIS — E1140 Type 2 diabetes mellitus with diabetic neuropathy, unspecified: Secondary | ICD-10-CM | POA: Diagnosis not present

## 2022-06-03 DIAGNOSIS — Z6831 Body mass index (BMI) 31.0-31.9, adult: Secondary | ICD-10-CM | POA: Diagnosis not present

## 2022-06-03 DIAGNOSIS — G629 Polyneuropathy, unspecified: Secondary | ICD-10-CM | POA: Diagnosis not present

## 2022-06-07 ENCOUNTER — Telehealth: Payer: Self-pay | Admitting: Internal Medicine

## 2022-06-07 NOTE — Telephone Encounter (Signed)
Pt had a fall on 03/21 and went to the ED for it. Pt stated the feel nauseous and worried that the fall would of interfered with their pacemaker

## 2022-06-07 NOTE — Telephone Encounter (Signed)
Pt stated on 05/27/22 she feel on the cement and was evaluated by the ER. Patient stated has a pacemaker and wanted to make sure the pacemaker and wires are in the correct location. Pt stated since the fall her she still feels weak and will like an office visit to talk about her symptoms. Pt is scheduled with APP on 06/10/22.

## 2022-06-08 NOTE — Progress Notes (Deleted)
Cardiology Office Note:    Date:  06/08/2022   ID:  Sheila Gardner, DOB Mar 03, 1936, MRN [id number]  PCP:  Lawerance Cruel, [facility] Cardiologist:  Sinclair Grooms, MD (Inactive) Electrophysiologist:  Cristopher Peru, MD { Click to update primary MD,subspecialty MD or APP then REFRESH:1}  *** Referring MD: Freada Bergeron, MD   Chief Complaint:  No chief complaint on file. {Click here for Visit Info    :1}    History of Present Illness:   Sheila Gardner is a 87 y.o. female with  hx of persistent atrial fibrillation with rhythm control on Tikosyn, anticoagulation, heart murmur, hypertension, h/o syncope, osteoarthritis, dysfunction with permanent pacemaker placed 09/04/2021 for asymptomatic severe bradycardia Cristopher Peru).      Patient last saw Dr. [name] 02/03/22 and was doing well.   I saw the patient 04/27/22 with hypotension and decreased her lasix 20 mg daily with plans to decrease valsartan if still low at f/u.    05/04/22 Dr. Lovena Le dropped her base rate to 60 bpm and HR going up to 83 but doesn't make her feel bad. Still gets dizzy at times if she gets up quickly but some better.   I saw her 05/11/22 and stopped her lasix and K because of low BP's.   She called in and had a fall and wants to make sure pacemaker still in proper place and working and saw Jonni Sanger 06/10/22.                Past Medical History:  Diagnosis Date   Arthritis    Atrial fibrillation (HCC)    Breast cancer ([place])    Cancer ([place])    Breast- rt   Cough with sputum 2016   Elevated liver function tests 05/09/2013   GERD (gastroesophageal reflux disease)    COSTOCHONDRITIS   Heart murmur    Hip pain 05/08/2014   Hyperlipidemia    Hypertension    Lipoma    back of neck   PONV (postoperative nausea and vomiting)    only after rt hip surgery   Primary osteoarthritis of left knee 11/04/2015   Primary osteoarthritis of right hip 07/30/2014   Squamous cell carcinoma of skin  08/21/2019   atypical squa. proliferation-Left corner of mouth   Current Medications: No outpatient medications have been marked as taking for the 06/22/22 encounter (Appointment) with Imogene Burn, PA-C.    Allergies:   Fenofibrate and Ondansetron   Social History   Tobacco Use   Smoking status: Never   Smokeless tobacco: Never  Vaping Use   Vaping Use: Never used  Substance Use Topics   Alcohol use: Not Currently   Drug use: No    Family Hx: The patient's family history includes Cancer in her brother; Heart disease in her mother.  ROS     Physical Exam:    VS:  There were no vitals taken for this visit.    Wt Readings from Last 3 Encounters:  05/27/22 180 lb (81.6 kg)  05/11/22 186 lb 9.6 oz (84.6 kg)  04/27/22 187 lb 9.6 oz (85.1 kg)    Physical Exam  GEN: Well nourished, well developed, in no acute distress  HEENT: normal  Neck: no JVD, carotid bruits, or masses Cardiac:RRR; no murmurs, rubs, or gallops  Respiratory:  clear to auscultation bilaterally, normal work of breathing GI: soft, nontender, nondistended, + BS Ext: without cyanosis, clubbing, or edema, Good distal pulses bilaterally MS: no  deformity or atrophy  Skin: warm and dry, no rash Neuro:  Alert and Oriented x 3, Strength and sensation are intact Psych: euthymic mood, full affect        EKGs/Labs/Other Test Reviewed:    EKG:  EKG is *** ordered today.  The ekg ordered today demonstrates ***  Recent Labs: 12/24/2021: Magnesium 2.3 05/11/2022: ALT 16; BUN 14; Creatinine, Ser 0.76; Hemoglobin 11.4; Platelets 256; Potassium 4.7; Sodium 135   Recent Lipid Panel No results for input(s): "CHOL", "TRIG", "HDL", "VLDL", "LDLCALC", "LDLDIRECT" in the last 8760 hours.   Prior CV Studies: {Select studies to display:26339}  Cardiac cath 12/08/2020    Dist Cx lesion is 50% stenosed.   Mid LAD-1 lesion is 40% stenosed with 50% stenosed side branch in 2nd Diag.   Dist LAD lesion is 30% stenosed.    Mid LAD-2 lesion is 30% stenosed.   Previously placed Dist LM to Mid LAD stent (unknown type) is  widely patent.   The left ventricular systolic function is normal.   LV end diastolic pressure is normal.   The left ventricular ejection fraction is 55-65% by visual estimate.   Widely patent ostial to proximal LAD stent at site of prior atherectomy/DES stenting with 40 to 50% LAD stenoses beyond the stented segment and 30% mid and mid distal stenosis; large codominant left circumflex vessel with 50% stenosis after the OM 2 vessel in the AV groove circumflex and normal RCA.   Normal LV function without focal segmental wall motion abnormalities with EF estimated 55 to 60%.  LVEDP 14 mmHg   RECOMMENDATION: Eliquis can be resumed in a.m.  Medical therapy for concomitant CAD.  Rest of lipid-lowering therapy with target LDL less than 70.       Risk Assessment/Calculations/Metrics:   {Does this patient have ATRIAL FIBRILLATION?[phone]}     No BP recorded.  {Refresh Note OR Click here to enter BP  :1}***    ASSESSMENT & PLAN:   No problem-specific Assessment & Plan notes found for this encounter.   Dizziness with lower BP's-I stopped lasix and K. May need to decrease valsartan. Fall on cement and saw Mr. Tillery 06/10/22.   PAF on dofetilide and eliquis. No complaints and HR regular   Status post pacemaker 08/2021 for severe bradycardia followed by Dr. Georgia Lopes base rate decreased to 60 bpm and feeling much better.    Hypertension BP has been dropping. Will stop  lasix  and K and take prn for weight gain/edema. Consider decreasing valsartan if BP still low at f/u.   CAD orbital atherectomy and stent to the proximal and mid LAD 04/2019, cardiac cath 12/08/2020 widely patent ostial to proximal LAD stent with 40 to 50% LAD stenosis beyond the stented segment and 30% mid and mid distal stenosis, 50% large codominant left circumflex after OM 2, LVEF 55 to 65%. No angina            {Are you  ordering a CV Procedure (e.g. stress test, cath, DCCV, TEE, etc)?   Press F2        :[id number]   Dispo:  No follow-ups on file.   Medication Adjustments/Labs and Tests Ordered: Current medicines are reviewed at length with the patient today.  Concerns regarding medicines are outlined above.  Tests Ordered: No orders of the defined types were placed in this encounter.  Medication Changes: No orders of the defined types were placed in this encounter.  Sumner Boast, PA-C  06/08/2022 1:54 PM  [facility] [place], [place], [place]  [address] Phone: [phone]; Fax: [phone]

## 2022-06-09 DIAGNOSIS — H903 Sensorineural hearing loss, bilateral: Secondary | ICD-10-CM | POA: Diagnosis not present

## 2022-06-09 NOTE — Progress Notes (Unsigned)
  Electrophysiology Office Note:   Date:  06/10/2022  ID:  Sheila Gardner, DOB 06-06-1935, MRN [id number]  Primary Cardiologist: Sinclair Grooms, MD (Inactive) Electrophysiologist: Cristopher Peru, MD   History of Present Illness:   Sheila Gardner is a 87 y.o. female with h/o PAF on tikosyn, SND, HTN, and h/o syncope seen today for routine electrophysiology followup after a fall.   She called the office in February and was worried with intermittent dizziness that her HR may be "too high"  Since last being seen in our clinic the patient reports doing OK . She fell last month and wanted to make sure her PPM is OK. She has some fatigue and nausea in the middle of the day, but has many medications which could be contributing.  she denies chest pain, palpitations, PND, orthopnea, vomiting, dizziness, syncope, edema, weight gain, or early satiety.   Review of systems complete and found to be negative unless listed in HPI.   Device History: Medtronic Dual Chamber PPM implanted 10/2021 for Symptomatic bradycardia Loop recorder which showed symptomatic bradycardia/SND  Studies Reviewed:    PPM Interrogation-  reviewed in detail today,  See PACEART report.  EKG is ordered today. Personal review shows AV dual paced whythm with stable QT  Risk Assessment/Calculations:            Physical Exam:   VS:  BP 120/72   Pulse 68   Ht 5\' 4"  (1.626 m)   Wt 184 lb (83.5 kg)   SpO2 97%   BMI 31.58 kg/m    Wt Readings from Last 3 Encounters:  06/10/22 184 lb (83.5 kg)  05/27/22 180 lb (81.6 kg)  05/11/22 186 lb 9.6 oz (84.6 kg)     GEN: Well nourished, well developed in no acute distress NECK: No JVD; No carotid bruits CARDIAC: Regular rate and rhythm, no murmurs, rubs, gallops RESPIRATORY:  Clear to auscultation without rales, wheezing or rhonchi  ABDOMEN: Soft, non-tender, non-distended EXTREMITIES:  No edema; No deformity   ASSESSMENT AND PLAN:    Symptomatic bradycardia s/p Medtronic PPM   Normal PPM function See Pace Art report No changes today  HTN Stable on current regimen   PAF EKG today shows NSR with stable intervals on Tikosyn Continue eliquis 5 mg BID for CHA2DS2/VASc of at least 6.\   Dizziness / Falls 1 fall, likely mechanical  Nothing on device to explain, and she has not had syncope.  Re-assurance given.  Dizziness has been vertiginous at times and she does have meclizine to use as needed.   Disposition:   Follow up with Dr. Lovena Le in 6 months for tikosyn f/u.   Signed, Shirley Friar, PA-C

## 2022-06-10 ENCOUNTER — Ambulatory Visit: Payer: PPO | Attending: Student | Admitting: Student

## 2022-06-10 ENCOUNTER — Encounter: Payer: Self-pay | Admitting: Student

## 2022-06-10 VITALS — BP 120/72 | HR 68 | Ht 64.0 in | Wt 184.0 lb

## 2022-06-10 DIAGNOSIS — I480 Paroxysmal atrial fibrillation: Secondary | ICD-10-CM | POA: Diagnosis not present

## 2022-06-10 DIAGNOSIS — Z950 Presence of cardiac pacemaker: Secondary | ICD-10-CM | POA: Diagnosis not present

## 2022-06-10 DIAGNOSIS — R42 Dizziness and giddiness: Secondary | ICD-10-CM

## 2022-06-10 DIAGNOSIS — I2510 Atherosclerotic heart disease of native coronary artery without angina pectoris: Secondary | ICD-10-CM | POA: Diagnosis not present

## 2022-06-10 DIAGNOSIS — I495 Sick sinus syndrome: Secondary | ICD-10-CM

## 2022-06-10 LAB — CUP PACEART INCLINIC DEVICE CHECK
Date Time Interrogation Session: 20240404101105
Implantable Lead Connection Status: 753985
Implantable Lead Connection Status: 753985
Implantable Lead Implant Date: 20230807
Implantable Lead Implant Date: 20230807
Implantable Lead Location: 753859
Implantable Lead Location: 753860
Implantable Lead Model: 3830
Implantable Lead Model: 5076
Implantable Pulse Generator Implant Date: 20230807

## 2022-06-10 NOTE — Patient Instructions (Signed)
Medication Instructions:  Your physician recommends that you continue on your current medications as directed. Please refer to the Current Medication list given to you today.  *If you need a refill on your cardiac medications before your next appointment, please call your pharmacy*  Lab Work: BMET, MAG--TODAY If you have labs (blood work) drawn today and your tests are completely normal, you will receive your results only by: [place] (if you have MyChart) OR A paper copy in the mail If you have any lab test that is abnormal or we need to change your treatment, we will call you to review the results.  Follow-Up: At [facility], you and your health needs are our priority.  As part of our continuing mission to provide you with exceptional heart care, we have created designated Provider Care Teams.  These Care Teams include your primary Cardiologist (physician) and Advanced Practice Providers (APPs -  Physician Assistants and Nurse Practitioners) who all work together to provide you with the care you need, when you need it.  Your next appointment:   6 month(s)  Provider:   Cristopher Peru, MD

## 2022-06-11 ENCOUNTER — Telehealth: Payer: Self-pay | Admitting: Internal Medicine

## 2022-06-11 DIAGNOSIS — Z79899 Other long term (current) drug therapy: Secondary | ICD-10-CM

## 2022-06-11 DIAGNOSIS — I10 Essential (primary) hypertension: Secondary | ICD-10-CM

## 2022-06-11 LAB — BASIC METABOLIC PANEL
BUN/Creatinine Ratio: 24 (ref 12–28)
BUN: 14 mg/dL (ref 8–27)
CO2: 23 mmol/L (ref 20–29)
Calcium: 9.4 mg/dL (ref 8.7–10.3)
Chloride: 98 mmol/L (ref 96–106)
Creatinine, Ser: 0.58 mg/dL (ref 0.57–1.00)
Glucose: 125 mg/dL — ABNORMAL HIGH (ref 70–99)
Potassium: 3.8 mmol/L (ref 3.5–5.2)
Sodium: 137 mmol/L (ref 134–144)
eGFR: 89 mL/min/{1.73_m2} (ref >59)

## 2022-06-11 LAB — MAGNESIUM: Magnesium: 2.1 mg/dL (ref 1.6–2.3)

## 2022-06-11 NOTE — Telephone Encounter (Signed)
Spoke with patient and discussed recommendation per Otilio Saber, PA-C: If she is taking K 40 meq daily would continue for now and needs repeat BMET in 7-10 days.    BMET ordered and lab appt scheduled for 06/21/22.  Patient verbalized understanding.

## 2022-06-11 NOTE — Telephone Encounter (Signed)
Returned call to patient and discussed lab results with patient.   Per Otilio Saber, PA-C: Would have her take Potassium 10 meq daily and recheck BMET at upcoming appointment with PA [facility]    Patient states last week she started taking 40 meq of potassium daily because she felt like her potassium was low. She states since taking potassium 40 meq daily she has had no dizziness and feels good. She requested to cancel her appt with Jacolyn Reedy, PA-C on 06/22/22 as she does not need to follow-up on her dizziness now that it has resolved. She will call if anything changes.  Medication list updated.  Will forward to Otilio Saber, PA-C and Jacolyn Reedy, PA-C for review.

## 2022-06-11 NOTE — Telephone Encounter (Signed)
Patient returned RN's call. 

## 2022-06-14 ENCOUNTER — Ambulatory Visit
Admission: RE | Admit: 2022-06-14 | Discharge: 2022-06-14 | Disposition: A | Discharge status: Home / Self Care | Payer: PPO | Source: Ambulatory Visit | Attending: Family Medicine | Admitting: Family Medicine

## 2022-06-14 DIAGNOSIS — Z1231 Encounter for screening mammogram for malignant neoplasm of breast: Secondary | ICD-10-CM | POA: Diagnosis not present

## 2022-06-17 ENCOUNTER — Telehealth: Payer: Self-pay | Admitting: Internal Medicine

## 2022-06-17 MED ORDER — DOFETILIDE 125 MCG PO CAPS: 125.0000 ug | ORAL_CAPSULE | Freq: Two times a day (BID) | ORAL | 3 refills | Status: DC

## 2022-06-17 NOTE — Telephone Encounter (Signed)
Pt's medication was sent to pt's pharmacy as requested. Confirmation received.  °

## 2022-06-17 NOTE — Telephone Encounter (Signed)
We don't have samples of this med. Looks like she may just need a refill, ok to send in. Important that she does not go without med - if she misses a few doses, it needs to be reinitiated in the hospital.

## 2022-06-17 NOTE — Telephone Encounter (Signed)
Patient calling the office for samples of medication:   1.  What medication and dosage are you requesting samples for? dofetilide (TIKOSYN) 125 MCG capsule   2.  Are you currently out of this medication? No - Has only 1 tablet left

## 2022-06-20 ENCOUNTER — Other Ambulatory Visit: Payer: Self-pay | Admitting: Internal Medicine

## 2022-06-21 ENCOUNTER — Ambulatory Visit: Payer: PPO | Attending: Internal Medicine

## 2022-06-21 DIAGNOSIS — Z79899 Other long term (current) drug therapy: Secondary | ICD-10-CM | POA: Diagnosis not present

## 2022-06-21 DIAGNOSIS — I10 Essential (primary) hypertension: Secondary | ICD-10-CM | POA: Diagnosis not present

## 2022-06-22 ENCOUNTER — Ambulatory Visit: Payer: PPO | Admitting: Physician Assistant

## 2022-06-22 LAB — BASIC METABOLIC PANEL
BUN/Creatinine Ratio: 16 (ref 12–28)
BUN: 13 mg/dL (ref 8–27)
CO2: 26 mmol/L (ref 20–29)
Calcium: 9.6 mg/dL (ref 8.7–10.3)
Chloride: 94 mmol/L — ABNORMAL LOW (ref 96–106)
Creatinine, Ser: 0.83 mg/dL (ref 0.57–1.00)
Glucose: 136 mg/dL — ABNORMAL HIGH (ref 70–99)
Potassium: 4.3 mmol/L (ref 3.5–5.2)
Sodium: 136 mmol/L (ref 134–144)
eGFR: 69 mL/min/{1.73_m2} (ref >59)

## 2022-07-15 ENCOUNTER — Ambulatory Visit (INDEPENDENT_AMBULATORY_CARE_PROVIDER_SITE_OTHER): Payer: PPO

## 2022-07-15 DIAGNOSIS — I495 Sick sinus syndrome: Secondary | ICD-10-CM | POA: Diagnosis not present

## 2022-07-15 LAB — CUP PACEART REMOTE DEVICE CHECK
Battery Remaining Longevity: 119 mo
Battery Voltage: 3.06 V
Brady Statistic AP VP Percent: 94.51 %
Brady Statistic AP VS Percent: 0.32 %
Brady Statistic AS VP Percent: 2.36 %
Brady Statistic AS VS Percent: 2.82 %
Brady Statistic RA Percent Paced: 94.88 %
Brady Statistic RV Percent Paced: 96.86 %
Date Time Interrogation Session: 20240509022226
Implantable Lead Connection Status: 753985
Implantable Lead Connection Status: 753985
Implantable Lead Implant Date: 20230807
Implantable Lead Implant Date: 20230807
Implantable Lead Location: 753859
Implantable Lead Location: 753860
Implantable Lead Model: 3830
Implantable Lead Model: 5076
Implantable Pulse Generator Implant Date: 20230807
Lead Channel Impedance Value: 399 Ohm
Lead Channel Impedance Value: 456 Ohm
Lead Channel Impedance Value: 513 Ohm
Lead Channel Impedance Value: 608 Ohm
Lead Channel Pacing Threshold Amplitude: 0.625 V
Lead Channel Pacing Threshold Amplitude: 0.625 V
Lead Channel Pacing Threshold Pulse Width: 0.4 ms
Lead Channel Pacing Threshold Pulse Width: 0.4 ms
Lead Channel Sensing Intrinsic Amplitude: 19.125 mV
Lead Channel Sensing Intrinsic Amplitude: 19.125 mV
Lead Channel Sensing Intrinsic Amplitude: 4.625 mV
Lead Channel Sensing Intrinsic Amplitude: 4.625 mV
Lead Channel Setting Pacing Amplitude: 2 V
Lead Channel Setting Pacing Amplitude: 2.5 V
Lead Channel Setting Pacing Pulse Width: 0.4 ms
Lead Channel Setting Sensing Sensitivity: 1.2 mV
Zone Setting Status: 755011

## 2022-07-27 DIAGNOSIS — Z950 Presence of cardiac pacemaker: Secondary | ICD-10-CM | POA: Diagnosis not present

## 2022-07-27 DIAGNOSIS — D6869 Other thrombophilia: Secondary | ICD-10-CM | POA: Diagnosis not present

## 2022-07-27 DIAGNOSIS — R42 Dizziness and giddiness: Secondary | ICD-10-CM | POA: Diagnosis not present

## 2022-07-27 DIAGNOSIS — R110 Nausea: Secondary | ICD-10-CM | POA: Diagnosis not present

## 2022-07-27 DIAGNOSIS — E1121 Type 2 diabetes mellitus with diabetic nephropathy: Secondary | ICD-10-CM | POA: Diagnosis not present

## 2022-07-27 DIAGNOSIS — Z6831 Body mass index (BMI) 31.0-31.9, adult: Secondary | ICD-10-CM | POA: Diagnosis not present

## 2022-07-27 DIAGNOSIS — R300 Dysuria: Secondary | ICD-10-CM | POA: Diagnosis not present

## 2022-07-27 DIAGNOSIS — I10 Essential (primary) hypertension: Secondary | ICD-10-CM | POA: Diagnosis not present

## 2022-07-27 DIAGNOSIS — R531 Weakness: Secondary | ICD-10-CM | POA: Diagnosis not present

## 2022-07-27 DIAGNOSIS — I4891 Unspecified atrial fibrillation: Secondary | ICD-10-CM | POA: Diagnosis not present

## 2022-07-27 DIAGNOSIS — I700 Atherosclerosis of aorta: Secondary | ICD-10-CM | POA: Diagnosis not present

## 2022-07-27 DIAGNOSIS — R519 Headache, unspecified: Secondary | ICD-10-CM | POA: Diagnosis not present

## 2022-07-28 DIAGNOSIS — R350 Frequency of micturition: Secondary | ICD-10-CM | POA: Diagnosis not present

## 2022-08-04 NOTE — Progress Notes (Signed)
Remote pacemaker transmission.   

## 2022-08-18 ENCOUNTER — Telehealth: Payer: Self-pay | Admitting: Internal Medicine

## 2022-08-18 NOTE — Telephone Encounter (Signed)
I spoke with patient who reports dizziness is not new and has been going on for awhile.  Was seen in April and dizziness was discussed at that office visit.  Patient reports dizziness has been going on since at least that time.  At times it is worse.  She feels tired and often feels she has to lie down.  Does not report any recent falls.  Patient is going to send device transmission

## 2022-08-18 NOTE — Telephone Encounter (Signed)
STAT if patient feels like he/she is going to faint   Are you dizzy now? No   Do you feel faint or have you passed out? No   Do you have any other symptoms? Weakness   Have you checked your HR and BP (record if available)? 120/74 BP 76 HR   Patient states she can not take a few steps forward without feeling unbearably dizzy. Requesting call back to discuss.

## 2022-08-19 ENCOUNTER — Telehealth: Payer: Self-pay | Admitting: Internal Medicine

## 2022-08-19 NOTE — Telephone Encounter (Signed)
Patient called and advised of findings. Patient appreciative of call.   Patient would like to schedule an apt. With gen cards provider.

## 2022-08-19 NOTE — Telephone Encounter (Signed)
Patient is asking that the nurse gives her a call back,so that she can discuss her heart issues. Please advise

## 2022-08-19 NOTE — Telephone Encounter (Signed)
Returned call to patient. Patient states she has been experiencing increased SOB when walking. She states this has been going on for "a while" now but it's happening almost every day now.  She also reports feeling nauseated and dizzy to the point she feels as though she is going to pass out. She notes this occurs more often when her HR is in the upper 80's/lower 90's. When this happens she will lie down and let it pass.  Patient denies any CP, swelling, or increased weight.  Patient has pacemaker set to 60bpm. She states this has been better than when it was set at 70.  Patient is a former Dr. Katrinka Blazing patient and requests to see Dr. Anne Fu to evaluate her DOE/dizziness. Patient also has appt with PCP on 6/17 to assess symptoms and determine if medications are possibly the cause. Offered next available appt with Dr. Anne Fu on 6/24 at 2:00pm, patient accepted.

## 2022-08-19 NOTE — Telephone Encounter (Signed)
Remote transmission received and reviewed. Shows normal device function. No episodes triggered. Nothing on report to correlate with symptoms. Will call patient to f/u after 8 AM.

## 2022-08-20 DIAGNOSIS — H524 Presbyopia: Secondary | ICD-10-CM | POA: Diagnosis not present

## 2022-08-20 DIAGNOSIS — H00025 Hordeolum internum left lower eyelid: Secondary | ICD-10-CM | POA: Diagnosis not present

## 2022-08-20 DIAGNOSIS — H52223 Regular astigmatism, bilateral: Secondary | ICD-10-CM | POA: Diagnosis not present

## 2022-08-20 DIAGNOSIS — H16142 Punctate keratitis, left eye: Secondary | ICD-10-CM | POA: Diagnosis not present

## 2022-08-20 DIAGNOSIS — H401121 Primary open-angle glaucoma, left eye, mild stage: Secondary | ICD-10-CM | POA: Diagnosis not present

## 2022-08-20 DIAGNOSIS — H5203 Hypermetropia, bilateral: Secondary | ICD-10-CM | POA: Diagnosis not present

## 2022-08-23 DIAGNOSIS — F411 Generalized anxiety disorder: Secondary | ICD-10-CM | POA: Diagnosis not present

## 2022-08-23 DIAGNOSIS — N390 Urinary tract infection, site not specified: Secondary | ICD-10-CM | POA: Diagnosis not present

## 2022-08-23 DIAGNOSIS — Z6831 Body mass index (BMI) 31.0-31.9, adult: Secondary | ICD-10-CM | POA: Diagnosis not present

## 2022-08-23 DIAGNOSIS — R0600 Dyspnea, unspecified: Secondary | ICD-10-CM | POA: Diagnosis not present

## 2022-08-27 DIAGNOSIS — M545 Low back pain, unspecified: Secondary | ICD-10-CM | POA: Diagnosis not present

## 2022-08-27 DIAGNOSIS — M79604 Pain in right leg: Secondary | ICD-10-CM | POA: Diagnosis not present

## 2022-08-27 DIAGNOSIS — R262 Difficulty in walking, not elsewhere classified: Secondary | ICD-10-CM | POA: Diagnosis not present

## 2022-08-27 DIAGNOSIS — M4696 Unspecified inflammatory spondylopathy, lumbar region: Secondary | ICD-10-CM | POA: Diagnosis not present

## 2022-08-27 DIAGNOSIS — M5416 Radiculopathy, lumbar region: Secondary | ICD-10-CM | POA: Diagnosis not present

## 2022-08-30 ENCOUNTER — Encounter: Payer: Self-pay | Admitting: Cardiology

## 2022-08-30 ENCOUNTER — Ambulatory Visit: Payer: PPO | Attending: Cardiology | Admitting: Cardiology

## 2022-08-30 VITALS — BP 124/70 | HR 68 | Ht 63.0 in | Wt 183.0 lb

## 2022-08-30 DIAGNOSIS — Z950 Presence of cardiac pacemaker: Secondary | ICD-10-CM | POA: Diagnosis not present

## 2022-08-30 DIAGNOSIS — I2510 Atherosclerotic heart disease of native coronary artery without angina pectoris: Secondary | ICD-10-CM | POA: Diagnosis not present

## 2022-08-30 DIAGNOSIS — I10 Essential (primary) hypertension: Secondary | ICD-10-CM

## 2022-08-30 NOTE — Progress Notes (Signed)
Cardiology Office Note:    Date:  08/30/2022   ID:  Sheila Gardner, DOB 1936/01/09, MRN [id number]  PCP:  Daisy Floro, MD   [facility] Providers Cardiologist:  Donato Schultz, MD Electrophysiologist:  Lewayne Bunting, MD     Referring MD: Daisy Floro, MD   No chief complaint on file.   History of Present Illness:    Sheila Gardner is a 87 y.o. female former patient of Dr. Sherilyn Cooter Smith's here for the follow-up of persistent atrial fibrillation with rhythm control on Tikosyn, anticoagulation, pacemaker, Dr. Ladona Ridgel placed on 09/04/2021 for severe bradycardia.  Overall been doing quite well.  No syncope.  No chest pain no orthopnea. SOB with walking. No dizziness today. Called last week with those symptoms.  Her symptoms somewhat sound vagal like in etiology.  She had to leave a party the other day because she felt poor like she was going to faint.  Nausea.  Sweaty.  Pacer changed to 60.  Felt better at 1 point   Past Medical History:  Diagnosis Date   Arthritis    Atrial fibrillation (HCC)    Breast cancer (HCC)    Cancer (HCC)    Breast- rt   Cough with sputum 2016   Elevated liver function tests 05/09/2013   GERD (gastroesophageal reflux disease)    COSTOCHONDRITIS   Heart murmur    Hip pain 05/08/2014   Hyperlipidemia    Hypertension    Lipoma    back of neck   PONV (postoperative nausea and vomiting)    only after rt hip surgery   Primary osteoarthritis of left knee 11/04/2015   Primary osteoarthritis of right hip 07/30/2014   Squamous cell carcinoma of skin 08/21/2019   atypical squa. proliferation-Left corner of mouth    Past Surgical History:  Procedure Laterality Date   ANKLE FRACTURE SURGERY Right 2013   RIGHT    BREAST LUMPECTOMY Right 2011   BREAST SURGERY  04/22/2009   Rt lumpectomy   CARDIOVERSION N/A 02/19/2019   Procedure: CARDIOVERSION;  Surgeon: Lars Masson, MD;  Location: [facility] ENDOSCOPY;  Service: Cardiovascular;  Laterality: N/A;    CORONARY ATHERECTOMY N/A 04/30/2019   Procedure: CORONARY ATHERECTOMY;  Surgeon: Lyn Records, MD;  Location: [facility] INVASIVE CV LAB;  Service: Cardiovascular;  Laterality: N/A;  LAD   CORONARY STENT INTERVENTION N/A 04/30/2019   Procedure: CORONARY STENT INTERVENTION;  Surgeon: Lyn Records, MD;  Location: [facility] INVASIVE CV LAB;  Service: Cardiovascular;  Laterality: N/A;  DES TO PROX-MID LAD   EYE SURGERY  2001   macular hole   JOINT REPLACEMENT  2016   KNEE SURGERY     LEFT HEART CATH AND CORONARY ANGIOGRAPHY N/A 04/30/2019   Procedure: LEFT HEART CATH AND CORONARY ANGIOGRAPHY;  Surgeon: Lyn Records, MD;  Location: [facility] INVASIVE CV LAB;  Service: Cardiovascular;  Laterality: N/A;   LEFT HEART CATH AND CORONARY ANGIOGRAPHY N/A 12/08/2020   Procedure: LEFT HEART CATH AND CORONARY ANGIOGRAPHY;  Surgeon: Lennette Bihari, MD;  Location: [facility] INVASIVE CV LAB;  Service: Cardiovascular;  Laterality: N/A;   LOOP RECORDER REMOVAL N/A 10/12/2021   Procedure: LOOP RECORDER REMOVAL;  Surgeon: Marinus Maw, MD;  Location: [facility] INVASIVE CV LAB;  Service: Cardiovascular;  Laterality: N/A;   MIDDLE EAR SURGERY Left 08/18/2015   repair eardrum and reconstruction   OVARY SURGERY  40 years ago   wedge   PACEMAKER IMPLANT N/A 10/12/2021   Procedure: PACEMAKER IMPLANT;  Surgeon: Marinus Maw, MD;  Location: [facility] INVASIVE CV LAB;  Service: Cardiovascular;  Laterality: N/A;   TOTAL HIP ARTHROPLASTY Right 07/30/2014   Procedure: TOTAL HIP ARTHROPLASTY ANTERIOR APPROACH;  Surgeon: Marcene Corning, MD;  Location: [facility] OR;  Service: Orthopedics;  Laterality: Right;   TOTAL KNEE ARTHROPLASTY Left 11/04/2015   TOTAL KNEE ARTHROPLASTY Left 11/04/2015   Procedure: TOTAL KNEE ARTHROPLASTY;  Surgeon: Marcene Corning, MD;  Location: [facility] OR;  Service: Orthopedics;  Laterality: Left;   TYMPANOPLASTY  30 years ago    Current Medications: Current Meds  Medication Sig   acetaminophen (TYLENOL) 500 MG tablet Take 1,000 mg by mouth every  6 (six) hours as needed for moderate pain or mild pain.   ascorbic acid (VITAMIN C) 500 MG tablet Take 500 mg by mouth daily.   Biotin 5000 MCG TABS Take 5,000 Units by mouth daily with breakfast.   Cholecalciferol (VITAMIN D-3) 1000 UNITS CAPS Take 2,000 Units by mouth daily with breakfast.   cyanocobalamin 1000 MCG tablet Take 1,000 mcg by mouth daily.    diclofenac Sodium (VOLTAREN) 1 % GEL Apply 2 g topically daily as needed (pain).   dofetilide (TIKOSYN) 125 MCG capsule Take 1 capsule (125 mcg total) by mouth 2 (two) times daily.   ELIQUIS 5 MG TABS tablet Take 1 tablet (5 mg total) by mouth 2 (two) times daily.   ferrous sulfate 325 (65 FE) MG tablet Take 325 mg by mouth daily with breakfast.   furosemide (LASIX) 40 MG tablet Take 20 mg by mouth as needed (Increased weight gain).   gabapentin (NEURONTIN) 300 MG capsule Take 300 mg by mouth 2 (two) times daily.   hydrALAZINE (APRESOLINE) 25 MG tablet TAKE 1 TABLET(25 MG) BY MOUTH IN THE MORNING AND AT BEDTIME   hydrocortisone cream 1 % Apply 1 Application topically daily as needed for itching (irritation). Ultra Moisturizing   iron polysaccharides (NIFEREX) 150 MG capsule Take 150 mg by mouth 2 (two) times daily.   lidocaine (LMX) 4 % cream Apply 1 Application topically daily as needed (leg pain).   LORazepam (ATIVAN) 1 MG tablet Take 1 mg by mouth 2 (two) times daily as needed for anxiety.   magnesium oxide (MAG-OX) 400 (240 Mg) MG tablet TAKE 1 TABLET BY MOUTH EVERY DAY   meclizine (ANTIVERT) 12.5 MG tablet Take 1 tablet (12.5 mg total) by mouth daily as needed for dizziness.   Melatonin 10 MG TABS Take 10 mg by mouth at bedtime as needed (Sleep).   neomycin-polymyxin b-dexamethasone (MAXITROL) 3.5-10000-0.1 SUSP Place 1 drop into the left eye 4 (four) times daily.   Omega-3 Fatty Acids (OMEGA 3 500 PO) Take 1 tablet by mouth daily. Multi omega 3   OVER THE COUNTER MEDICATION Take 1 tablet by mouth daily. Health brain all day focus    OVER THE COUNTER MEDICATION Take 1 capsule by mouth daily. Balance of Nature/Fruits&Vegetables   pantoprazole (PROTONIX) 20 MG tablet Take 20 mg by mouth daily at 6 (six) AM.   potassium chloride (KLOR-CON) 10 MEQ tablet Take 40 mEq by mouth daily.   Probiotic Product ([employer] PROBIOTIC) CHEW Chew 1 tablet by mouth daily at 6 (six) AM.   pyridOXINE (VITAMIN B-6) 100 MG tablet Take 100 mg by mouth daily.   rosuvastatin (CRESTOR) 40 MG tablet Take 1 tablet (40 mg total) by mouth daily at 6 PM.   tetrahydrozoline 0.05 % ophthalmic solution Place 1 drop into both eyes daily as needed (dry eyes).   timolol (  TIMOPTIC) 0.25 % ophthalmic solution 1 drop. In affected eye twice per day   valsartan (DIOVAN) 320 MG tablet Take 1 tablet (320 mg total) by mouth daily. (Patient taking differently: Take 320 mg by mouth every evening.)   vitamin E 400 UNIT capsule Take 400 Units by mouth daily.     Allergies:   Fenofibrate and Ondansetron   Social History   Socioeconomic History   Marital status: Widowed    Spouse name: Not on file   Number of children: Not on file   Years of education: Not on file   Highest education level: Not on file  Occupational History   Not on file  Tobacco Use   Smoking status: Never   Smokeless tobacco: Never  Vaping Use   Vaping Use: Never used  Substance and Sexual Activity   Alcohol use: Not Currently   Drug use: No   Sexual activity: Not Currently    Birth control/protection: None  Other Topics Concern   Not on file  Social History Narrative   Not on file   Social Determinants of Health   Financial Resource Strain: Not on file  Food Insecurity: Not on file  Transportation Needs: Not on file  Physical Activity: Not on file  Stress: Not on file  Social Connections: Not on file     Family History: The patient's family history includes Cancer in her brother; Heart disease in her mother.  ROS:   Please see the history of present illness.     All other systems  reviewed and are negative.  EKGs/Labs/Other Studies Reviewed:    The following studies were reviewed today:  Cardiac cath 12/08/2020    Dist Cx lesion is 50% stenosed.   Mid LAD-1 lesion is 40% stenosed with 50% stenosed side branch in 2nd Diag.   Dist LAD lesion is 30% stenosed.   Mid LAD-2 lesion is 30% stenosed.   Previously placed Dist LM to Mid LAD stent (unknown type) is  widely patent.   The left ventricular systolic function is normal.   LV end diastolic pressure is normal.   The left ventricular ejection fraction is 55-65% by visual estimate.   Widely patent ostial to proximal LAD stent at site of prior atherectomy/DES stenting with 40 to 50% LAD stenoses beyond the stented segment and 30% mid and mid distal stenosis; large codominant left circumflex vessel with 50% stenosis after the OM 2 vessel in the AV groove circumflex and normal RCA.   Normal LV function without focal segmental wall motion abnormalities with EF estimated 55 to 60%.  LVEDP 14 mmHg   RECOMMENDATION: Eliquis can be resumed in a.m.  Medical therapy for concomitant CAD.  Rest of lipid-lowering therapy with target LDL less than 70. EKG Interpretation  Date/Time:  Monday August 30 2022 14:16:36 EDT Ventricular Rate:  68 PR Interval:  192 QRS Duration: 108 QT Interval:  416 QTC Calculation: 442 R Axis:   -42 Text Interpretation: AV dual-paced rhythm When compared with ECG of 13-Oct-2021 04:51, Vent. rate has decreased BY   2 BPM Confirmed by Donato Schultz ([id number]) on 08/30/2022 2:35:58 PM    Recent Labs: 05/11/2022: ALT 16; Hemoglobin 11.4; Platelets 256 06/10/2022: Magnesium 2.1 06/21/2022: BUN 13; Creatinine, Ser 0.83; Potassium 4.3; Sodium 136  Recent Lipid Panel    Component Value Date/Time   CHOL 124 12/08/2020 0307   TRIG 115 12/08/2020 0307   HDL 57 12/08/2020 0307   CHOLHDL 2.2 12/08/2020 0307   VLDL 23 12/08/2020  0307   LDLCALC 44 12/08/2020 0307     Risk Assessment/Calculations:                Physical Exam:    VS:  BP 124/70   Pulse 68   Ht 5\' 3"  (1.6 m)   Wt 183 lb (83 kg)   SpO2 95%   BMI 32.42 kg/m     Wt Readings from Last 3 Encounters:  08/30/22 183 lb (83 kg)  06/10/22 184 lb (83.5 kg)  05/27/22 180 lb (81.6 kg)     GEN:  Well nourished, well developed in no acute distress HEENT: Normal NECK: No JVD; No carotid bruits LYMPHATICS: No lymphadenopathy CARDIAC: RRR, no murmurs, rubs, gallops RESPIRATORY:  Clear to auscultation without rales, wheezing or rhonchi  ABDOMEN: Soft, non-tender, non-distended MUSCULOSKELETAL:  No edema; No deformity  SKIN: Warm and dry NEUROLOGIC:  Alert and oriented x 3 PSYCHIATRIC:  Normal affect   ASSESSMENT:    1. Coronary artery disease involving native coronary artery of native heart without angina pectoris   2. Primary hypertension   3. Pacemaker    PLAN:    In order of problems listed above:  Coronary artery disease - Left main/proximal LAD stent widely patent in 2022 cardiac catheterization.  No anginal symptoms.  Paroxysmal atrial fibrillation - Currently on dofetilide, rhythm control.  Pacemaker can detect return of A-fib.  Eliquis  Pacemaker - Placed in 2023, doing well.  Dr. Ladona Ridgel.  Functioning well.  Symptoms of dizziness, transient tiredness - Could be hyper vagal especially since associated with nausea.  Continue to hydrate.  Hold furosemide on those days.  She states that she has several good days and then has a day where she feels washed out.  Hypertension - Good blood pressure control despite hydralazine being discontinued.  May at some point be able to decrease Diovan.  Hyperlipidemia - LDL 77 on 05/31/2022.  Continue with medical management.          Medication Adjustments/Labs and Tests Ordered: Current medicines are reviewed at length with the patient today.  Concerns regarding medicines are outlined above.  Orders Placed This Encounter  Procedures   EKG 12-Lead   No orders of the  defined types were placed in this encounter.   Patient Instructions  Medication Instructions:  The current medical regimen is effective;  continue present plan and medications.  *If you need a refill on your cardiac medications before your next appointment, please call your pharmacy*   Follow-Up: At [facility], you and your health needs are our priority.  As part of our continuing mission to provide you with exceptional heart care, we have created designated Provider Care Teams.  These Care Teams include your primary Cardiologist (physician) and Advanced Practice Providers (APPs -  Physician Assistants and Nurse Practitioners) who all work together to provide you with the care you need, when you need it.  We recommend signing up for the patient portal called "MyChart".  Sign up information is provided on this After Visit Summary.  MyChart is used to connect with patients for Virtual Visits (Telemedicine).  Patients are able to view lab/test results, encounter notes, upcoming appointments, etc.  Non-urgent messages can be sent to your provider as well.   To learn more about what you can do with MyChart, go to [email or web address].    Your next appointment:   6 month(s)  Provider:   Jacolyn Reedy, PA-C     Then, Donato Schultz, MD will plan to  see you again in 1 year(s).       Signed, Donato Schultz, MD  08/30/2022 2:46 PM    [facility]

## 2022-08-30 NOTE — Patient Instructions (Signed)
Medication Instructions:  The current medical regimen is effective;  continue present plan and medications.  *If you need a refill on your cardiac medications before your next appointment, please call your pharmacy*   Follow-Up: At [facility], you and your health needs are our priority.  As part of our continuing mission to provide you with exceptional heart care, we have created designated Provider Care Teams.  These Care Teams include your primary Cardiologist (physician) and Advanced Practice Providers (APPs -  Physician Assistants and Nurse Practitioners) who all work together to provide you with the care you need, when you need it.  We recommend signing up for the patient portal called "MyChart".  Sign up information is provided on this After Visit Summary.  MyChart is used to connect with patients for Virtual Visits (Telemedicine).  Patients are able to view lab/test results, encounter notes, upcoming appointments, etc.  Non-urgent messages can be sent to your provider as well.   To learn more about what you can do with MyChart, go to [email or web address].    Your next appointment:   6 month(s)  Provider:   Michele Lenze, PA-C     Then, Mark Skains, MD will plan to see you again in 1 year(s).     

## 2022-09-02 DIAGNOSIS — M545 Low back pain, unspecified: Secondary | ICD-10-CM | POA: Diagnosis not present

## 2022-09-02 DIAGNOSIS — M79604 Pain in right leg: Secondary | ICD-10-CM | POA: Diagnosis not present

## 2022-09-02 DIAGNOSIS — R262 Difficulty in walking, not elsewhere classified: Secondary | ICD-10-CM | POA: Diagnosis not present

## 2022-09-07 DIAGNOSIS — M79604 Pain in right leg: Secondary | ICD-10-CM | POA: Diagnosis not present

## 2022-09-07 DIAGNOSIS — R262 Difficulty in walking, not elsewhere classified: Secondary | ICD-10-CM | POA: Diagnosis not present

## 2022-09-07 DIAGNOSIS — M545 Low back pain, unspecified: Secondary | ICD-10-CM | POA: Diagnosis not present

## 2022-09-10 DIAGNOSIS — M79604 Pain in right leg: Secondary | ICD-10-CM | POA: Diagnosis not present

## 2022-09-10 DIAGNOSIS — M545 Low back pain, unspecified: Secondary | ICD-10-CM | POA: Diagnosis not present

## 2022-09-10 DIAGNOSIS — R262 Difficulty in walking, not elsewhere classified: Secondary | ICD-10-CM | POA: Diagnosis not present

## 2022-09-15 DIAGNOSIS — H401121 Primary open-angle glaucoma, left eye, mild stage: Secondary | ICD-10-CM | POA: Diagnosis not present

## 2022-09-15 DIAGNOSIS — R262 Difficulty in walking, not elsewhere classified: Secondary | ICD-10-CM | POA: Diagnosis not present

## 2022-09-15 DIAGNOSIS — M545 Low back pain, unspecified: Secondary | ICD-10-CM | POA: Diagnosis not present

## 2022-09-15 DIAGNOSIS — M79604 Pain in right leg: Secondary | ICD-10-CM | POA: Diagnosis not present

## 2022-09-17 DIAGNOSIS — M545 Low back pain, unspecified: Secondary | ICD-10-CM | POA: Diagnosis not present

## 2022-09-17 DIAGNOSIS — R262 Difficulty in walking, not elsewhere classified: Secondary | ICD-10-CM | POA: Diagnosis not present

## 2022-09-17 DIAGNOSIS — M79604 Pain in right leg: Secondary | ICD-10-CM | POA: Diagnosis not present

## 2022-09-24 DIAGNOSIS — M79604 Pain in right leg: Secondary | ICD-10-CM | POA: Diagnosis not present

## 2022-09-24 DIAGNOSIS — M545 Low back pain, unspecified: Secondary | ICD-10-CM | POA: Diagnosis not present

## 2022-09-24 DIAGNOSIS — M4696 Unspecified inflammatory spondylopathy, lumbar region: Secondary | ICD-10-CM | POA: Diagnosis not present

## 2022-09-24 DIAGNOSIS — R262 Difficulty in walking, not elsewhere classified: Secondary | ICD-10-CM | POA: Diagnosis not present

## 2022-09-28 DIAGNOSIS — M47816 Spondylosis without myelopathy or radiculopathy, lumbar region: Secondary | ICD-10-CM | POA: Diagnosis not present

## 2022-10-03 ENCOUNTER — Other Ambulatory Visit: Payer: Self-pay | Admitting: Student

## 2022-10-03 DIAGNOSIS — I480 Paroxysmal atrial fibrillation: Secondary | ICD-10-CM

## 2022-10-04 NOTE — Telephone Encounter (Signed)
Prescription refill request for Eliquis received. Indication: Afib  Last office visit: 08/30/22 Anne Fu)  Scr: 0.83 (06/21/22)  Age: 87 Weight: 83kg  Appropriate dose. Refill sent.

## 2022-10-05 DIAGNOSIS — I495 Sick sinus syndrome: Secondary | ICD-10-CM | POA: Diagnosis not present

## 2022-10-05 DIAGNOSIS — E261 Secondary hyperaldosteronism: Secondary | ICD-10-CM | POA: Diagnosis not present

## 2022-10-05 DIAGNOSIS — I110 Hypertensive heart disease with heart failure: Secondary | ICD-10-CM | POA: Diagnosis not present

## 2022-10-05 DIAGNOSIS — E669 Obesity, unspecified: Secondary | ICD-10-CM | POA: Diagnosis not present

## 2022-10-05 DIAGNOSIS — I4891 Unspecified atrial fibrillation: Secondary | ICD-10-CM | POA: Diagnosis not present

## 2022-10-05 DIAGNOSIS — D509 Iron deficiency anemia, unspecified: Secondary | ICD-10-CM | POA: Diagnosis not present

## 2022-10-05 DIAGNOSIS — I509 Heart failure, unspecified: Secondary | ICD-10-CM | POA: Diagnosis not present

## 2022-10-05 DIAGNOSIS — D6859 Other primary thrombophilia: Secondary | ICD-10-CM | POA: Diagnosis not present

## 2022-10-05 DIAGNOSIS — D6869 Other thrombophilia: Secondary | ICD-10-CM | POA: Diagnosis not present

## 2022-10-05 DIAGNOSIS — I700 Atherosclerosis of aorta: Secondary | ICD-10-CM | POA: Diagnosis not present

## 2022-10-05 DIAGNOSIS — E1142 Type 2 diabetes mellitus with diabetic polyneuropathy: Secondary | ICD-10-CM | POA: Diagnosis not present

## 2022-10-05 DIAGNOSIS — E1121 Type 2 diabetes mellitus with diabetic nephropathy: Secondary | ICD-10-CM | POA: Diagnosis not present

## 2022-10-07 ENCOUNTER — Other Ambulatory Visit: Payer: Self-pay | Admitting: Otolaryngology

## 2022-10-07 DIAGNOSIS — J324 Chronic pansinusitis: Secondary | ICD-10-CM | POA: Diagnosis not present

## 2022-10-07 DIAGNOSIS — J329 Chronic sinusitis, unspecified: Secondary | ICD-10-CM

## 2022-10-07 DIAGNOSIS — H903 Sensorineural hearing loss, bilateral: Secondary | ICD-10-CM | POA: Diagnosis not present

## 2022-10-07 DIAGNOSIS — J343 Hypertrophy of nasal turbinates: Secondary | ICD-10-CM | POA: Diagnosis not present

## 2022-10-14 ENCOUNTER — Ambulatory Visit (INDEPENDENT_AMBULATORY_CARE_PROVIDER_SITE_OTHER): Payer: Medicare Other

## 2022-10-14 DIAGNOSIS — I495 Sick sinus syndrome: Secondary | ICD-10-CM | POA: Diagnosis not present

## 2022-10-14 DIAGNOSIS — I480 Paroxysmal atrial fibrillation: Secondary | ICD-10-CM

## 2022-10-14 LAB — CUP PACEART REMOTE DEVICE CHECK
Battery Remaining Longevity: 120 mo
Battery Voltage: 3.04 V
Brady Statistic AP VP Percent: 92.46 %
Brady Statistic AP VS Percent: 0.26 %
Brady Statistic AS VP Percent: 3.29 %
Brady Statistic AS VS Percent: 3.99 %
Brady Statistic RA Percent Paced: 92.84 %
Brady Statistic RV Percent Paced: 95.75 %
Date Time Interrogation Session: 20240808003856
Implantable Lead Connection Status: 753985
Implantable Lead Connection Status: 753985
Implantable Lead Implant Date: 20230807
Implantable Lead Implant Date: 20230807
Implantable Lead Location: 753859
Implantable Lead Location: 753860
Implantable Lead Model: 3830
Implantable Lead Model: 5076
Implantable Pulse Generator Implant Date: 20230807
Lead Channel Impedance Value: 437 Ohm
Lead Channel Impedance Value: 513 Ohm
Lead Channel Impedance Value: 551 Ohm
Lead Channel Impedance Value: 665 Ohm
Lead Channel Pacing Threshold Amplitude: 0.625 V
Lead Channel Pacing Threshold Amplitude: 0.75 V
Lead Channel Pacing Threshold Pulse Width: 0.4 ms
Lead Channel Pacing Threshold Pulse Width: 0.4 ms
Lead Channel Sensing Intrinsic Amplitude: 22.625 mV
Lead Channel Sensing Intrinsic Amplitude: 22.625 mV
Lead Channel Sensing Intrinsic Amplitude: 5.5 mV
Lead Channel Sensing Intrinsic Amplitude: 5.5 mV
Lead Channel Setting Pacing Amplitude: 2 V
Lead Channel Setting Pacing Amplitude: 2.5 V
Lead Channel Setting Pacing Pulse Width: 0.4 ms
Lead Channel Setting Sensing Sensitivity: 1.2 mV
Zone Setting Status: 755011

## 2022-10-18 ENCOUNTER — Other Ambulatory Visit: Payer: PPO

## 2022-10-26 ENCOUNTER — Telehealth: Payer: Self-pay

## 2022-10-26 NOTE — Telephone Encounter (Signed)
Pt states she feels like she is dizzy every single day. Her heart rate by in the 80 bpm. She she feels like a drag. She says she feels better when her heart rate is 69 bpm. I asked the patient to send in a manual transmission. Transmission received. I let her speak with Baird Lyons, rn.

## 2022-10-26 NOTE — Telephone Encounter (Signed)
MDT PPM GT Made appointment w/ DC for Thursday @ 0840 to adjust RR to less aggressive settings. Pt c/o HR too high. ADL HR set at 95. Presenting on remote manual 93 BPM. Looks like RR was enabled 11/06/21 and base rate lowered from 70 to 60 06/10/22.

## 2022-10-28 ENCOUNTER — Ambulatory Visit: Payer: PPO | Attending: Cardiovascular Disease

## 2022-10-28 DIAGNOSIS — I495 Sick sinus syndrome: Secondary | ICD-10-CM

## 2022-10-28 LAB — CUP PACEART INCLINIC DEVICE CHECK
Date Time Interrogation Session: 20240822184035
Implantable Lead Connection Status: 753985
Implantable Lead Connection Status: 753985
Implantable Lead Implant Date: 20230807
Implantable Lead Implant Date: 20230807
Implantable Lead Location: 753859
Implantable Lead Location: 753860
Implantable Lead Model: 3830
Implantable Lead Model: 5076
Implantable Pulse Generator Implant Date: 20230807

## 2022-10-28 NOTE — Progress Notes (Signed)
Patient brought in today for evaluation of her rate response programming. [place], MDT rep, present and assisting today. Patient has been complaining of dizziness, increased heart rates, pounding in chest and dyspnea during resting and active periods.  She reports these symptoms have been progressively getting worse.  Her family has noted her heavy breathing even during rest periods.  She reports her blood pressure has been normal.   See device report for full details around RR adjustments to make it less sensitive/aggressive.  In addition, patient has been set up to see Dr. Ladona Ridgel on Monday 8/26 to further evaluate symptoms and consider overall if patient may not be tolerating VPacing.  Suspect a work up with general cardiology may also be warranted.  We have made numerous adjustments over the past year to RR and LRL which seem to help for a period of time before patient reports returning symptoms.   Patient does have ER precautions if symptoms worsen or experiences severe SOB, Chest pain, dizziness or syncopal event.   PROGRAMMING CHANGES:  Upper Sensor Rate 130 bpm to 110 bpm ADL Response from 3 to 1 ADL Setpoint from 6 to 11 Exertion Response from 3 to 2 Activity Threshold from Low to High

## 2022-10-28 NOTE — Patient Instructions (Signed)
We have made changes to your rate response feature on your device to make your heart rate less aggressive with activity.  It will give you good support without "too much".    Given your ongoing symptoms, we will have you follow up with Dr. Ladona Ridgel on Monday, 11/01/22 at 1015am.   Call our device clinic at [phone] if any questions or concerns.  If any severe, progressing shortness of breath, chest pain, syncope/near syncope or stroke like symptoms, call 911 and go to the ER.   Thank you for your visit today.

## 2022-11-01 ENCOUNTER — Ambulatory Visit: Payer: PPO | Attending: Internal Medicine | Admitting: Internal Medicine

## 2022-11-01 ENCOUNTER — Encounter: Payer: Self-pay | Admitting: Internal Medicine

## 2022-11-01 VITALS — BP 148/80 | HR 61 | Ht 63.0 in | Wt 186.0 lb

## 2022-11-01 DIAGNOSIS — I509 Heart failure, unspecified: Secondary | ICD-10-CM

## 2022-11-01 DIAGNOSIS — R42 Dizziness and giddiness: Secondary | ICD-10-CM | POA: Diagnosis not present

## 2022-11-01 DIAGNOSIS — Z950 Presence of cardiac pacemaker: Secondary | ICD-10-CM | POA: Diagnosis not present

## 2022-11-01 LAB — CUP PACEART INCLINIC DEVICE CHECK
Date Time Interrogation Session: 20240826104906
Implantable Lead Connection Status: 753985
Implantable Lead Connection Status: 753985
Implantable Lead Implant Date: 20230807
Implantable Lead Implant Date: 20230807
Implantable Lead Location: 753859
Implantable Lead Location: 753860
Implantable Lead Model: 3830
Implantable Lead Model: 5076
Implantable Pulse Generator Implant Date: 20230807

## 2022-11-01 NOTE — Progress Notes (Signed)
HPI Sheila Gardner returns today for followup. She is a pleasant 87 yo woman with a h/o PAF who has been maintained on dofetilide. She lost almost 10 lbs. She has had rare palpitations. She has class 2 dyspnea. She underwent DDD PM insertion several months ago. She still has occaisional periods of dizziness. She tries to be active. Her biggest complaint over the past few months has been progressive dyspnea with exertion.  Allergies  Allergen Reactions   Fenofibrate Other (See Comments)    elevated LFTs   Ondansetron Other (See Comments)    drug interaction     Current Outpatient Medications  Medication Sig Dispense Refill   acetaminophen (TYLENOL) 500 MG tablet Take 1,000 mg by mouth every 6 (six) hours as needed for moderate pain or mild pain.     apixaban (ELIQUIS) 5 MG TABS tablet TAKE 1 TABLET BY MOUTH TWICE A DAY 180 tablet 1   ascorbic acid (VITAMIN C) 500 MG tablet Take 500 mg by mouth daily.     Biotin 5000 MCG TABS Take 5,000 Units by mouth daily with breakfast.     Cholecalciferol (VITAMIN D-3) 1000 UNITS CAPS Take 2,000 Units by mouth daily with breakfast.     cyanocobalamin 1000 MCG tablet Take 1,000 mcg by mouth daily.      diclofenac Sodium (VOLTAREN) 1 % GEL Apply 2 g topically daily as needed (pain).     dofetilide (TIKOSYN) 125 MCG capsule Take 1 capsule (125 mcg total) by mouth 2 (two) times daily. 180 capsule 3   ferrous sulfate 325 (65 FE) MG tablet Take 325 mg by mouth daily with breakfast.     furosemide (LASIX) 40 MG tablet Take 20 mg by mouth as needed (Increased weight gain).     gabapentin (NEURONTIN) 300 MG capsule Take 300 mg by mouth 2 (two) times daily.     hydrALAZINE (APRESOLINE) 25 MG tablet TAKE 1 TABLET(25 MG) BY MOUTH IN THE MORNING AND AT BEDTIME 180 tablet 3   hydrocortisone cream 1 % Apply 1 Application topically daily as needed for itching (irritation). Ultra Moisturizing     iron polysaccharides (NIFEREX) 150 MG capsule Take 150 mg by mouth 2  (two) times daily.     lidocaine (LMX) 4 % cream Apply 1 Application topically daily as needed (leg pain).     LORazepam (ATIVAN) 1 MG tablet Take 1 mg by mouth 2 (two) times daily as needed for anxiety.     magnesium oxide (MAG-OX) 400 (240 Mg) MG tablet TAKE 1 TABLET BY MOUTH EVERY DAY 90 tablet 3   meclizine (ANTIVERT) 12.5 MG tablet Take 1 tablet (12.5 mg total) by mouth daily as needed for dizziness. 30 tablet 0   Omega-3 Fatty Acids (OMEGA 3 500 PO) Take 1 tablet by mouth daily. Multi omega 3     OVER THE COUNTER MEDICATION Take 1 tablet by mouth daily. Health brain all day focus     OVER THE COUNTER MEDICATION Take 1 capsule by mouth daily. Balance of Nature/Fruits&Vegetables     pantoprazole (PROTONIX) 20 MG tablet Take 20 mg by mouth daily at 6 (six) AM.     potassium chloride (KLOR-CON) 10 MEQ tablet Take 40 mEq by mouth daily.     Probiotic Product ([employer] PROBIOTIC) CHEW Chew 1 tablet by mouth daily at 6 (six) AM.     pyridOXINE (VITAMIN B-6) 100 MG tablet Take 100 mg by mouth daily.     rosuvastatin (CRESTOR) 40 MG  tablet Take 1 tablet (40 mg total) by mouth daily at 6 PM. 30 tablet 6   timolol (TIMOPTIC) 0.25 % ophthalmic solution 1 drop. In affected eye twice per day     valsartan (DIOVAN) 320 MG tablet Take 1 tablet (320 mg total) by mouth daily. (Patient taking differently: Take 320 mg by mouth every evening.) 90 tablet 3   vitamin E 400 UNIT capsule Take 400 Units by mouth daily.     Melatonin 10 MG TABS Take 10 mg by mouth at bedtime as needed (Sleep).     neomycin-polymyxin b-dexamethasone (MAXITROL) 3.5-10000-0.1 SUSP Place 1 drop into the left eye 4 (four) times daily.     tetrahydrozoline 0.05 % ophthalmic solution Place 1 drop into both eyes daily as needed (dry eyes).     No current facility-administered medications for this visit.     Past Medical History:  Diagnosis Date   Arthritis    Atrial fibrillation (HCC)    Breast cancer (HCC)    Cancer (HCC)    Breast- rt    Cough with sputum 2016   Elevated liver function tests 05/09/2013   GERD (gastroesophageal reflux disease)    COSTOCHONDRITIS   Heart murmur    Hip pain 05/08/2014   Hyperlipidemia    Hypertension    Lipoma    back of neck   PONV (postoperative nausea and vomiting)    only after rt hip surgery   Primary osteoarthritis of left knee 11/04/2015   Primary osteoarthritis of right hip 07/30/2014   Squamous cell carcinoma of skin 08/21/2019   atypical squa. proliferation-Left corner of mouth    ROS:   All systems reviewed and negative except as noted in the HPI.   Past Surgical History:  Procedure Laterality Date   ANKLE FRACTURE SURGERY Right 2013   RIGHT    BREAST LUMPECTOMY Right 2011   BREAST SURGERY  04/22/2009   Rt lumpectomy   CARDIOVERSION N/A 02/19/2019   Procedure: CARDIOVERSION;  Surgeon: Lars Masson, MD;  Location: [facility] ENDOSCOPY;  Service: Cardiovascular;  Laterality: N/A;   CORONARY ATHERECTOMY N/A 04/30/2019   Procedure: CORONARY ATHERECTOMY;  Surgeon: Lyn Records, MD;  Location: [facility] INVASIVE CV LAB;  Service: Cardiovascular;  Laterality: N/A;  LAD   CORONARY STENT INTERVENTION N/A 04/30/2019   Procedure: CORONARY STENT INTERVENTION;  Surgeon: Lyn Records, MD;  Location: [facility] INVASIVE CV LAB;  Service: Cardiovascular;  Laterality: N/A;  DES TO PROX-MID LAD   EYE SURGERY  2001   macular hole   JOINT REPLACEMENT  2016   KNEE SURGERY     LEFT HEART CATH AND CORONARY ANGIOGRAPHY N/A 04/30/2019   Procedure: LEFT HEART CATH AND CORONARY ANGIOGRAPHY;  Surgeon: Lyn Records, MD;  Location: [facility] INVASIVE CV LAB;  Service: Cardiovascular;  Laterality: N/A;   LEFT HEART CATH AND CORONARY ANGIOGRAPHY N/A 12/08/2020   Procedure: LEFT HEART CATH AND CORONARY ANGIOGRAPHY;  Surgeon: Lennette Bihari, MD;  Location: [facility] INVASIVE CV LAB;  Service: Cardiovascular;  Laterality: N/A;   LOOP RECORDER REMOVAL N/A 10/12/2021   Procedure: LOOP RECORDER REMOVAL;  Surgeon: Marinus Maw, MD;   Location: [facility] INVASIVE CV LAB;  Service: Cardiovascular;  Laterality: N/A;   MIDDLE EAR SURGERY Left 08/18/2015   repair eardrum and reconstruction   OVARY SURGERY  40 years ago   wedge   PACEMAKER IMPLANT N/A 10/12/2021   Procedure: PACEMAKER IMPLANT;  Surgeon: Marinus Maw, MD;  Location: [facility] INVASIVE CV LAB;  Service:  Cardiovascular;  Laterality: N/A;   TOTAL HIP ARTHROPLASTY Right 07/30/2014   Procedure: TOTAL HIP ARTHROPLASTY ANTERIOR APPROACH;  Surgeon: Marcene Corning, MD;  Location: [facility] OR;  Service: Orthopedics;  Laterality: Right;   TOTAL KNEE ARTHROPLASTY Left 11/04/2015   TOTAL KNEE ARTHROPLASTY Left 11/04/2015   Procedure: TOTAL KNEE ARTHROPLASTY;  Surgeon: Marcene Corning, MD;  Location: [facility] OR;  Service: Orthopedics;  Laterality: Left;   TYMPANOPLASTY  30 years ago     Family History  Problem Relation Age of Onset   Heart disease Mother    Cancer Brother        colon, pancreatic, brain     Social History   Socioeconomic History   Marital status: Widowed    Spouse name: Not on file   Number of children: Not on file   Years of education: Not on file   Highest education level: Not on file  Occupational History   Not on file  Tobacco Use   Smoking status: Never   Smokeless tobacco: Never  Vaping Use   Vaping status: Never Used  Substance and Sexual Activity   Alcohol use: Not Currently   Drug use: No   Sexual activity: Not Currently    Birth control/protection: None  Other Topics Concern   Not on file  Social History Narrative   Not on file   Social Determinants of Health   Financial Resource Strain: Not on file  Food Insecurity: Not on file  Transportation Needs: Not on file  Physical Activity: Not on file  Stress: Not on file  Social Connections: Not on file  Intimate Partner Violence: Not on file     BP (!) 148/80   Pulse 61   Ht 5\' 3"  (1.6 m)   Wt 186 lb (84.4 kg)   SpO2 98%   BMI 32.95 kg/m   Physical Exam:  Well appearing NAD HEENT:  Unremarkable Neck:  No JVD, no thyromegally Lymphatics:  No adenopathy Back:  No CVA tenderness Lungs:  Clear with no wheezes HEART:  Regular rate rhythm, no murmurs, no rubs, no clicks Abd:  soft, positive bowel sounds, no organomegally, no rebound, no guarding Ext:  2 plus pulses, no edema, no cyanosis, no clubbing Skin:  No rashes no nodules Neuro:  CN II through XII intact, motor grossly intact  EKG - NSR with AV pacing  DEVICE  Normal device function.  See PaceArt for details.   Assess/Plan: Dyspnea - the etiology is unclear. I will ask her to undergo a 2D echo. Additional testing based on the echo results. CHB - she is s/p PPM insertion with no additional bradycardia. HTN - her bp is better at home. I migh conisder entresto in the future. PAF - she appears to be maintaining NSR. No change.   Sheila Gowda Joriel Streety,MD

## 2022-11-01 NOTE — Patient Instructions (Addendum)
Medication Instructions:  Your physician recommends that you continue on your current medications as directed. Please refer to the Current Medication list given to you today.  *If you need a refill on your cardiac medications before your next appointment, please call your pharmacy*  Lab Work: None ordered.   If you have labs (blood work) drawn today and your tests are completely normal, you will receive your results only by: MyChart Message (if you have MyChart) OR A paper copy in the mail If you have any lab test that is abnormal or we need to change your treatment, we will call you to review the results.  Testing/Procedures: Echo  Follow-Up: At [facility], you and your health needs are our priority.  As part of our continuing mission to provide you with exceptional heart care, we have created designated Provider Care Teams.  These Care Teams include your primary Cardiologist (physician) and Advanced Practice Providers (APPs -  Physician Assistants and Nurse Practitioners) who all work together to provide you with the care you need, when you need it.    Your next appointment:   Pending results of test   .ech Important Information About Sugar

## 2022-11-02 DIAGNOSIS — I509 Heart failure, unspecified: Secondary | ICD-10-CM | POA: Diagnosis not present

## 2022-11-02 DIAGNOSIS — Z6830 Body mass index (BMI) 30.0-30.9, adult: Secondary | ICD-10-CM | POA: Diagnosis not present

## 2022-11-02 DIAGNOSIS — I110 Hypertensive heart disease with heart failure: Secondary | ICD-10-CM | POA: Diagnosis not present

## 2022-11-02 DIAGNOSIS — I480 Paroxysmal atrial fibrillation: Secondary | ICD-10-CM | POA: Diagnosis not present

## 2022-11-03 ENCOUNTER — Ambulatory Visit
Admission: RE | Admit: 2022-11-03 | Discharge: 2022-11-03 | Disposition: A | Discharge status: Home / Self Care | Payer: PPO | Source: Ambulatory Visit | Attending: Otolaryngology | Admitting: Otolaryngology

## 2022-11-03 DIAGNOSIS — R519 Headache, unspecified: Secondary | ICD-10-CM | POA: Diagnosis not present

## 2022-11-03 DIAGNOSIS — J329 Chronic sinusitis, unspecified: Secondary | ICD-10-CM

## 2022-11-03 DIAGNOSIS — R42 Dizziness and giddiness: Secondary | ICD-10-CM | POA: Diagnosis not present

## 2022-11-09 ENCOUNTER — Telehealth: Payer: Self-pay | Admitting: Internal Medicine

## 2022-11-09 NOTE — Telephone Encounter (Signed)
Pt would like to know if Dorzolamide Timolo is going to interfere with her heart medication. Please advise

## 2022-11-09 NOTE — Telephone Encounter (Signed)
Will get input about this matter from our PharmD team.  Triage nursing to follow-up with the pt accordingly thereafter, once PharmD advises.

## 2022-11-09 NOTE — Telephone Encounter (Signed)
Called the pt back and endorsed to her that per Pharmacist here in our clinic, her eyedrop is fine for her to take, and should have minimal systemic absorption.  Pt verbalized understanding and agrees with this plan.  Pt was more than gracious for all the assistance provided.

## 2022-11-09 NOTE — Telephone Encounter (Signed)
This is an eyedrop and is just fine for her to take, should have minimal systemic absorption.

## 2022-11-11 DIAGNOSIS — Z96652 Presence of left artificial knee joint: Secondary | ICD-10-CM | POA: Diagnosis not present

## 2022-11-11 DIAGNOSIS — M1612 Unilateral primary osteoarthritis, left hip: Secondary | ICD-10-CM | POA: Diagnosis not present

## 2022-11-11 DIAGNOSIS — M25552 Pain in left hip: Secondary | ICD-10-CM | POA: Diagnosis not present

## 2022-11-17 DIAGNOSIS — H401131 Primary open-angle glaucoma, bilateral, mild stage: Secondary | ICD-10-CM | POA: Diagnosis not present

## 2022-11-19 DIAGNOSIS — D3132 Benign neoplasm of left choroid: Secondary | ICD-10-CM | POA: Diagnosis not present

## 2022-11-19 DIAGNOSIS — H353132 Nonexudative age-related macular degeneration, bilateral, intermediate dry stage: Secondary | ICD-10-CM | POA: Diagnosis not present

## 2022-11-19 DIAGNOSIS — H401132 Primary open-angle glaucoma, bilateral, moderate stage: Secondary | ICD-10-CM | POA: Diagnosis not present

## 2022-11-19 DIAGNOSIS — H59811 Chorioretinal scars after surgery for detachment, right eye: Secondary | ICD-10-CM | POA: Diagnosis not present

## 2022-11-19 DIAGNOSIS — H35372 Puckering of macula, left eye: Secondary | ICD-10-CM | POA: Diagnosis not present

## 2022-11-22 ENCOUNTER — Telehealth: Payer: Self-pay

## 2022-11-22 ENCOUNTER — Ambulatory Visit (HOSPITAL_COMMUNITY): Payer: PPO | Attending: Internal Medicine

## 2022-11-22 DIAGNOSIS — R42 Dizziness and giddiness: Secondary | ICD-10-CM | POA: Diagnosis not present

## 2022-11-22 DIAGNOSIS — I509 Heart failure, unspecified: Secondary | ICD-10-CM | POA: Insufficient documentation

## 2022-11-22 DIAGNOSIS — Z950 Presence of cardiac pacemaker: Secondary | ICD-10-CM | POA: Insufficient documentation

## 2022-11-22 LAB — ECHOCARDIOGRAM COMPLETE
Area-P 1/2: 2.32 cm2
S' Lateral: 2.1 cm

## 2022-11-22 NOTE — Telephone Encounter (Signed)
Patient presenting for scheduled echo, requests order for CBC/BMET as she is concerned about ongoing dizziness that she discussed with Dr. Ladona Ridgel at visit 11/01/22. Advised that I would request labs on her behalf, last labs performed in April.

## 2022-11-24 NOTE — Telephone Encounter (Signed)
Spoke with pt (see echo result note) on 11/24/22. Told pt to try recommendations given by Dr Ladona Ridgel for a few weeks and if no improvements we will circle back around to asking for lab work etc.

## 2022-11-30 NOTE — Progress Notes (Signed)
Remote pacemaker transmission.   

## 2022-12-08 DIAGNOSIS — Z6831 Body mass index (BMI) 31.0-31.9, adult: Secondary | ICD-10-CM | POA: Diagnosis not present

## 2022-12-08 DIAGNOSIS — R2230 Localized swelling, mass and lump, unspecified upper limb: Secondary | ICD-10-CM | POA: Diagnosis not present

## 2022-12-13 ENCOUNTER — Other Ambulatory Visit: Payer: Self-pay | Admitting: Family Medicine

## 2022-12-13 DIAGNOSIS — E559 Vitamin D deficiency, unspecified: Secondary | ICD-10-CM | POA: Diagnosis not present

## 2022-12-13 DIAGNOSIS — Z79899 Other long term (current) drug therapy: Secondary | ICD-10-CM | POA: Diagnosis not present

## 2022-12-13 DIAGNOSIS — R2232 Localized swelling, mass and lump, left upper limb: Secondary | ICD-10-CM

## 2022-12-13 DIAGNOSIS — E1169 Type 2 diabetes mellitus with other specified complication: Secondary | ICD-10-CM | POA: Diagnosis not present

## 2022-12-13 DIAGNOSIS — E782 Mixed hyperlipidemia: Secondary | ICD-10-CM | POA: Diagnosis not present

## 2022-12-15 DIAGNOSIS — E1142 Type 2 diabetes mellitus with diabetic polyneuropathy: Secondary | ICD-10-CM | POA: Diagnosis not present

## 2022-12-15 DIAGNOSIS — D6869 Other thrombophilia: Secondary | ICD-10-CM | POA: Diagnosis not present

## 2022-12-15 DIAGNOSIS — I4891 Unspecified atrial fibrillation: Secondary | ICD-10-CM | POA: Diagnosis not present

## 2022-12-15 DIAGNOSIS — G629 Polyneuropathy, unspecified: Secondary | ICD-10-CM | POA: Diagnosis not present

## 2022-12-15 DIAGNOSIS — I10 Essential (primary) hypertension: Secondary | ICD-10-CM | POA: Diagnosis not present

## 2022-12-15 DIAGNOSIS — F411 Generalized anxiety disorder: Secondary | ICD-10-CM | POA: Diagnosis not present

## 2022-12-15 DIAGNOSIS — K219 Gastro-esophageal reflux disease without esophagitis: Secondary | ICD-10-CM | POA: Diagnosis not present

## 2022-12-15 DIAGNOSIS — Z1331 Encounter for screening for depression: Secondary | ICD-10-CM | POA: Diagnosis not present

## 2022-12-15 DIAGNOSIS — Z6832 Body mass index (BMI) 32.0-32.9, adult: Secondary | ICD-10-CM | POA: Diagnosis not present

## 2022-12-15 DIAGNOSIS — Z0000 Encounter for general adult medical examination without abnormal findings: Secondary | ICD-10-CM | POA: Diagnosis not present

## 2022-12-15 DIAGNOSIS — I700 Atherosclerosis of aorta: Secondary | ICD-10-CM | POA: Diagnosis not present

## 2022-12-22 ENCOUNTER — Other Ambulatory Visit: Payer: Self-pay | Admitting: Family Medicine

## 2022-12-22 DIAGNOSIS — R2232 Localized swelling, mass and lump, left upper limb: Secondary | ICD-10-CM

## 2022-12-30 ENCOUNTER — Other Ambulatory Visit: Payer: PPO

## 2023-01-04 DIAGNOSIS — M25562 Pain in left knee: Secondary | ICD-10-CM | POA: Diagnosis not present

## 2023-01-04 DIAGNOSIS — M25572 Pain in left ankle and joints of left foot: Secondary | ICD-10-CM | POA: Diagnosis not present

## 2023-01-04 DIAGNOSIS — M25552 Pain in left hip: Secondary | ICD-10-CM | POA: Diagnosis not present

## 2023-01-12 ENCOUNTER — Other Ambulatory Visit (HOSPITAL_COMMUNITY): Payer: Self-pay | Admitting: Orthopedic Surgery

## 2023-01-12 DIAGNOSIS — M5416 Radiculopathy, lumbar region: Secondary | ICD-10-CM

## 2023-01-13 ENCOUNTER — Telehealth: Payer: Self-pay | Admitting: Cardiology

## 2023-01-13 ENCOUNTER — Ambulatory Visit (INDEPENDENT_AMBULATORY_CARE_PROVIDER_SITE_OTHER): Payer: Medicare Other

## 2023-01-13 DIAGNOSIS — R55 Syncope and collapse: Secondary | ICD-10-CM | POA: Diagnosis not present

## 2023-01-13 LAB — CUP PACEART REMOTE DEVICE CHECK
Battery Remaining Longevity: 117 mo
Battery Voltage: 3.03 V
Brady Statistic AP VP Percent: 88.03 %
Brady Statistic AP VS Percent: 0.25 %
Brady Statistic AS VP Percent: 2.59 %
Brady Statistic AS VS Percent: 9.12 %
Brady Statistic RA Percent Paced: 88.3 %
Brady Statistic RV Percent Paced: 90.61 %
Date Time Interrogation Session: 20241107053452
Implantable Lead Connection Status: 753985
Implantable Lead Connection Status: 753985
Implantable Lead Implant Date: 20230807
Implantable Lead Implant Date: 20230807
Implantable Lead Location: 753859
Implantable Lead Location: 753860
Implantable Lead Model: 3830
Implantable Lead Model: 5076
Implantable Pulse Generator Implant Date: 20230807
Lead Channel Impedance Value: 418 Ohm
Lead Channel Impedance Value: 532 Ohm
Lead Channel Impedance Value: 532 Ohm
Lead Channel Impedance Value: 665 Ohm
Lead Channel Pacing Threshold Amplitude: 0.625 V
Lead Channel Pacing Threshold Amplitude: 0.75 V
Lead Channel Pacing Threshold Pulse Width: 0.4 ms
Lead Channel Pacing Threshold Pulse Width: 0.4 ms
Lead Channel Sensing Intrinsic Amplitude: 18.875 mV
Lead Channel Sensing Intrinsic Amplitude: 18.875 mV
Lead Channel Sensing Intrinsic Amplitude: 3.875 mV
Lead Channel Sensing Intrinsic Amplitude: 3.875 mV
Lead Channel Setting Pacing Amplitude: 2 V
Lead Channel Setting Pacing Amplitude: 2.5 V
Lead Channel Setting Pacing Pulse Width: 0.4 ms
Lead Channel Setting Sensing Sensitivity: 1.2 mV
Zone Setting Status: 755011

## 2023-01-13 NOTE — Telephone Encounter (Signed)
Toradol increases risk of bleeding with Eliquis and reduces antihypertensive effects of valsartan

## 2023-01-13 NOTE — Telephone Encounter (Signed)
Pt c/o medication issue:  1. Name of Medication: Toradol    2. How are you currently taking this medication (dosage and times per day)? Not currently taking   3. Are you having a reaction (difficulty breathing--STAT)?   4. What is your medication issue? Patient is requesting a call back to discuss this medication for knee pain. Would like to know med interaction for heart meds. Please advise.

## 2023-01-13 NOTE — Telephone Encounter (Signed)
Reviewed this information with pt who states understanding.  She will talk to her ortho MD further about pain management.

## 2023-01-20 DIAGNOSIS — M25562 Pain in left knee: Secondary | ICD-10-CM | POA: Diagnosis not present

## 2023-01-25 DIAGNOSIS — M25562 Pain in left knee: Secondary | ICD-10-CM | POA: Diagnosis not present

## 2023-01-25 DIAGNOSIS — M25552 Pain in left hip: Secondary | ICD-10-CM | POA: Diagnosis not present

## 2023-02-01 ENCOUNTER — Ambulatory Visit (HOSPITAL_COMMUNITY)
Admission: RE | Admit: 2023-02-01 | Discharge: 2023-02-01 | Disposition: A | Discharge status: Home / Self Care | Payer: PPO | Source: Ambulatory Visit | Attending: Orthopedic Surgery | Admitting: Orthopedic Surgery

## 2023-02-01 ENCOUNTER — Telehealth: Payer: Self-pay | Admitting: Cardiology

## 2023-02-01 DIAGNOSIS — M5416 Radiculopathy, lumbar region: Secondary | ICD-10-CM | POA: Diagnosis not present

## 2023-02-01 DIAGNOSIS — K8020 Calculus of gallbladder without cholecystitis without obstruction: Secondary | ICD-10-CM | POA: Diagnosis not present

## 2023-02-01 DIAGNOSIS — M5116 Intervertebral disc disorders with radiculopathy, lumbar region: Secondary | ICD-10-CM | POA: Diagnosis not present

## 2023-02-01 DIAGNOSIS — M4319 Spondylolisthesis, multiple sites in spine: Secondary | ICD-10-CM | POA: Diagnosis not present

## 2023-02-01 DIAGNOSIS — M48061 Spinal stenosis, lumbar region without neurogenic claudication: Secondary | ICD-10-CM | POA: Diagnosis not present

## 2023-02-01 NOTE — Telephone Encounter (Signed)
Patient states she has been having pain in her leg from hip to knee. She states this started 4 weeks ago and has progressively gotten worse.   She states she had a MRI performed today to see if pain is coming from her back.  Patient takes Tylenol every 6 hours, but this does not help the pain.  She is asking if she can take Diclofenac Sodium. Informed patient this is a NSAID and it is contraindicated with her Eliquis and valsartan (similar to the Toradol she inquired about earlier this month).  Patient would like to know what alternative she can use. Tylenol and Tramadol do not help her manage her pain effectively. She states she now has to lay in one position to tolerate the pain.  Will forward to Pharm D and Dr. Anne Fu to review and advise.

## 2023-02-01 NOTE — Telephone Encounter (Signed)
Pt would like to know if she is able to take Diclofenac Sodium for pain due to her heart issues. Pt would like a c/b as soon as possibly being that she is in a lot of pain. Please advise

## 2023-02-01 NOTE — Telephone Encounter (Signed)
She can use topical voltaren gel.  She can also try capsaicin cream, topical lidocaine (ointment or patch) or SalonPas patches to help with the pain.  If she is taking Tylenol, try to limit to 6 of the 500 mg tablets per day, otherwise it may be more than the liver can process.

## 2023-02-01 NOTE — Telephone Encounter (Signed)
Returned call to patient and shared pharmacist's recommendations:  She can use topical voltaren gel.  She can also try capsaicin cream, topical lidocaine (ointment or patch) or SalonPas patches to help with the pain.  If she is taking Tylenol, try to limit to 6 of the 500 mg tablets per day, otherwise it may be more than the liver can process.      Patient verbalized understanding and expressed appreciation for assistance.

## 2023-02-01 NOTE — Progress Notes (Signed)
Remote pacemaker transmission.   

## 2023-02-07 ENCOUNTER — Ambulatory Visit: Payer: PPO | Admitting: Cardiology

## 2023-02-08 ENCOUNTER — Other Ambulatory Visit: Payer: Self-pay | Admitting: Physical Medicine and Rehabilitation

## 2023-02-08 DIAGNOSIS — M5416 Radiculopathy, lumbar region: Secondary | ICD-10-CM | POA: Diagnosis not present

## 2023-02-08 DIAGNOSIS — M545 Low back pain, unspecified: Secondary | ICD-10-CM

## 2023-02-09 ENCOUNTER — Telehealth: Payer: Self-pay

## 2023-02-09 ENCOUNTER — Other Ambulatory Visit: Payer: Self-pay | Admitting: Physical Medicine and Rehabilitation

## 2023-02-09 ENCOUNTER — Encounter: Payer: Self-pay | Admitting: Physical Medicine and Rehabilitation

## 2023-02-09 DIAGNOSIS — M5416 Radiculopathy, lumbar region: Secondary | ICD-10-CM

## 2023-02-09 DIAGNOSIS — M545 Low back pain, unspecified: Secondary | ICD-10-CM

## 2023-02-09 NOTE — Telephone Encounter (Signed)
   Pre-operative Risk Assessment    Patient Name: Sheila Gardner  DOB: [birth year] MRN: [id number]  Last seen: 11/01/2022, Dr. Lewayne Bunting Next pt: 03/11/2023 with  Mayford Knife, Even    Request for Surgical Clearance    Procedure:   Lumbar epi/  Date of Surgery:  Clearance TBD                                 Surgeon:  [facility]  Surgeon's Group or Practice Name:  [facility]  Phone number:  [phone] Fax number:  [phone]   Type of Clearance Requested:   - Medical  - Pharmacy:  Hold Apixaban (Eliquis) 2 day prior    Type of Anesthesia:  Not Indicated   Additional requests/questions:   N/A  Signed, Stevan Born   02/09/2023, 3:12 PM

## 2023-02-09 NOTE — Telephone Encounter (Signed)
Pharmacy, can you please comment on how long Eliquis can be held prior to spinal injection?  Thank you!

## 2023-02-10 ENCOUNTER — Telehealth: Payer: Self-pay | Admitting: Cardiology

## 2023-02-10 NOTE — Telephone Encounter (Signed)
Patient is calling to talk with Dr. Anne Fu or nurse

## 2023-02-10 NOTE — Telephone Encounter (Signed)
Patient with diagnosis of A Fib on Eliquis for anticoagulation.    Procedure:  Lumbar epi  Date of procedure: TBD   CHA2DS2-VASc Score = 6  This indicates a 9.7% annual risk of stroke. The patient's score is based upon: CHF History: 1 HTN History: 1 Diabetes History: 0 Stroke History: 0 Vascular Disease History: 1 Age Score: 2 Gender Score: 1    CrCl 66 mL/min Platelet count 256K   Per office protocol, patient can hold Eliquis for 3 days prior to procedure.    **This guidance is not considered finalized until pre-operative APP has relayed final recommendations.**

## 2023-02-10 NOTE — Telephone Encounter (Signed)
   Name: Sheila Gardner  DOB: [birth year]  MRN: [id number]  Primary Cardiologist: Donato Schultz, MD   Preoperative team, please contact this patient and set up a phone call appointment for further preoperative risk assessment. Please obtain consent and complete medication review. Thank you for your help.  I confirm that guidance regarding antiplatelet and oral anticoagulation therapy has been completed and, if necessary, noted below.  Per office protocol, patient can hold Eliquis for 3 days prior to procedure.   I also confirmed the patient resides in the state of [place]. As per [facility] Medical Board telemedicine laws, the patient must reside in the state in which the provider is licensed.   Ronney Asters, NP 02/10/2023, 9:57 AM [facility]

## 2023-02-10 NOTE — Telephone Encounter (Signed)
Pt advised that her preop information has been placed in her chart and being managed by our proper team.

## 2023-02-11 NOTE — Telephone Encounter (Signed)
Patient already has an appointment with Dr. Ladona Ridgel on 12/9 for pre-op clearance.

## 2023-02-14 ENCOUNTER — Telehealth: Payer: Self-pay | Admitting: *Deleted

## 2023-02-14 ENCOUNTER — Telehealth: Payer: Self-pay | Admitting: Internal Medicine

## 2023-02-14 ENCOUNTER — Ambulatory Visit: Payer: PPO | Admitting: Internal Medicine

## 2023-02-14 NOTE — Telephone Encounter (Signed)
Patient scheduled Televisit 02-16-23

## 2023-02-14 NOTE — Telephone Encounter (Signed)
  Patient Consent for Virtual Visit    Sheila Gardner has provided verbal consent on 02/14/2023 for a virtual visit (video or telephone).   CONSENT FOR VIRTUAL VISIT FOR:  Sheila Gardner  By participating in this virtual visit I agree to the following:  I hereby voluntarily request, consent and authorize [facility] and its employed or contracted physicians, physician assistants, nurse practitioners or other licensed health care professionals (the Practitioner), to provide me with telemedicine health care services (the "Services") as deemed necessary by the treating Practitioner. I acknowledge and consent to receive the Services by the Practitioner via telemedicine. I understand that the telemedicine visit will involve communicating with the Practitioner through live audiovisual communication technology and the disclosure of certain medical information by electronic transmission. I acknowledge that I have been given the opportunity to request an in-person assessment or other available alternative prior to the telemedicine visit and am voluntarily participating in the telemedicine visit.  I understand that I have the right to withhold or withdraw my consent to the use of telemedicine in the course of my care at any time, without affecting my right to future care or treatment, and that the Practitioner or I may terminate the telemedicine visit at any time. I understand that I have the right to inspect all information obtained and/or recorded in the course of the telemedicine visit and may receive copies of available information for a reasonable fee.  I understand that some of the potential risks of receiving the Services via telemedicine include:  Delay or interruption in medical evaluation due to technological equipment failure or disruption; Information transmitted may not be sufficient (e.g. poor resolution of images) to allow for appropriate medical decision making by the Practitioner; and/or   In rare instances, security protocols could fail, causing a breach of personal health information.  Furthermore, I acknowledge that it is my responsibility to provide information about my medical history, conditions and care that is complete and accurate to the best of my ability. I acknowledge that Practitioner's advice, recommendations, and/or decision may be based on factors not within their control, such as incomplete or inaccurate data provided by me or distortions of diagnostic images or specimens that may result from electronic transmissions. I understand that the practice of medicine is not an exact science and that Practitioner makes no warranties or guarantees regarding treatment outcomes. I acknowledge that a copy of this consent can be made available to me via my patient portal [facility] MyChart), or I can request a printed copy by calling the office of  [facility].    I understand that my insurance will be billed for this visit.   I have read or had this consent read to me. I understand the contents of this consent, which adequately explains the benefits and risks of the Services being provided via telemedicine.  I have been provided ample opportunity to ask questions regarding this consent and the Services and have had my questions answered to my satisfaction. I give my informed consent for the services to be provided through the use of telemedicine in my medical care

## 2023-02-14 NOTE — Telephone Encounter (Signed)
Patient is requesting to speak with the Pre-Op team in regards to her upcoming procedure.

## 2023-02-16 ENCOUNTER — Telehealth: Payer: Self-pay | Admitting: Cardiology

## 2023-02-16 ENCOUNTER — Ambulatory Visit: Payer: PPO | Attending: Cardiology

## 2023-02-16 ENCOUNTER — Telehealth: Payer: Self-pay | Admitting: *Deleted

## 2023-02-16 DIAGNOSIS — Z01810 Encounter for preprocedural cardiovascular examination: Secondary | ICD-10-CM | POA: Diagnosis not present

## 2023-02-16 DIAGNOSIS — Z01818 Encounter for other preprocedural examination: Secondary | ICD-10-CM

## 2023-02-16 NOTE — Telephone Encounter (Signed)
Pt had tele appt today. I will fax notes to requesting office.

## 2023-02-16 NOTE — Telephone Encounter (Signed)
Caller Jodelle Green) is following-up on patient's clearance for an epidural.

## 2023-02-16 NOTE — Telephone Encounter (Signed)
  Tarri Fuller, CMA Certified Medical Assistant   Telephone Encounter Sign at exiting of workspace   Creation Time: 02/16/2023  4:16 PM   Sign at exiting of workspace     Pt had tele appt today. I will fax notes to requesting office.            Tag   Copy Erskine Squibb B   Telephone Encounter Signed   Creation Time: 02/16/2023  3:11 PM   Signed     Caller Jodelle Green) is following-up on patient's clearance for an epidural.

## 2023-02-16 NOTE — Progress Notes (Signed)
Virtual Visit via Telephone Note   Because of Sheila Gardner's co-morbid illnesses, she is at least at moderate risk for complications without adequate follow up.  This format is felt to be most appropriate for this patient at this time.  The patient did not have access to video technology/had technical difficulties with video requiring transitioning to audio format only (telephone).  All issues noted in this document were discussed and addressed.  No physical exam could be performed with this format.  Please refer to the patient's chart for her consent to telehealth for [facility].  Evaluation Performed:  Preoperative cardiovascular risk assessment _____________   Date:  02/16/2023   Patient ID:  Sheila Gardner, DOB 1935/12/27, MRN [id number] Patient Location:  Home Provider location:   Office  Primary Care Provider:  Daisy Floro, MD Primary Cardiologist:  Donato Schultz, MD  Chief Complaint / Patient Profile   87 y.o. y/o female with a h/o persistent atrial fibrillation with rhythm control on Tikosyn, anticoagulation, HTN,  CAD with DES and stenting to the LAD 2022, pacemaker, Dr. Ladona Ridgel placed on 09/04/2021 for severe bradycardia.    She is pending Lumbar EPI with [facility] and presents today for telephonic preoperative cardiovascular risk assessment.  History of Present Illness    Sheila Gardner is a 87 y.o. female who presents via audio/video conferencing for a telehealth visit today.  Pt was last seen in cardiology clinic on 08/30/2022 by DrMarland Kitchen Anne Fu.  At that time Sheila Gardner was doing well but still had days when she felt washed out.  The patient is now pending procedure as outlined above. Since her last visit, she has had occasions when her blood pressure is low. She drinks more fluids and eat a small meal. This helps and she feels much better. Her main complaint is that of leg pain which is causing her to not be active and have significant discomfort.    Past Medical History    Past Medical History:  Diagnosis Date   Arthritis    Atrial fibrillation (HCC)    Breast cancer (HCC)    Cancer (HCC)    Breast- rt   Cough with sputum 2016   Elevated liver function tests 05/09/2013   GERD (gastroesophageal reflux disease)    COSTOCHONDRITIS   Heart murmur    Hip pain 05/08/2014   Hyperlipidemia    Hypertension    Lipoma    back of neck   PONV (postoperative nausea and vomiting)    only after rt hip surgery   Primary osteoarthritis of left knee 11/04/2015   Primary osteoarthritis of right hip 07/30/2014   Squamous cell carcinoma of skin 08/21/2019   atypical squa. proliferation-Left corner of mouth   Past Surgical History:  Procedure Laterality Date   ANKLE FRACTURE SURGERY Right 2013   RIGHT    BREAST LUMPECTOMY Right 2011   BREAST SURGERY  04/22/2009   Rt lumpectomy   CARDIOVERSION N/A 02/19/2019   Procedure: CARDIOVERSION;  Surgeon: Lars Masson, MD;  Location[facility] ENDOSCOPY;  Service: Cardiovascular;  Laterality: N/A;   CORONARY ATHERECTOMY N/A 04/30/2019   Procedure: CORONARY ATHERECTOMY;  Surgeon: Lyn Records, MD;  Location: [facility] INVASIVE CV LAB;  Service: Cardiovascular;  Laterality: N/A;  LAD   CORONARY STENT INTERVENTION N/A 04/30/2019   Procedure: CORONARY STENT INTERVENTION;  Surgeon: Lyn Records, MD;  Location: [facility] INVASIVE CV LAB;  Service: Cardiovascular;  Laterality: N/A;  DES TO PROX-MID LAD  EYE SURGERY  2001   macular hole   JOINT REPLACEMENT  2016   KNEE SURGERY     LEFT HEART CATH AND CORONARY ANGIOGRAPHY N/A 04/30/2019   Procedure: LEFT HEART CATH AND CORONARY ANGIOGRAPHY;  Surgeon: Lyn Records, MD;  Location: [facility] INVASIVE CV LAB;  Service: Cardiovascular;  Laterality: N/A;   LEFT HEART CATH AND CORONARY ANGIOGRAPHY N/A 12/08/2020   Procedure: LEFT HEART CATH AND CORONARY ANGIOGRAPHY;  Surgeon: Lennette Bihari, MD;  Location: [facility] INVASIVE CV LAB;  Service: Cardiovascular;  Laterality: N/A;   LOOP  RECORDER REMOVAL N/A 10/12/2021   Procedure: LOOP RECORDER REMOVAL;  Surgeon: Marinus Maw, MD;  Location: [facility] INVASIVE CV LAB;  Service: Cardiovascular;  Laterality: N/A;   MIDDLE EAR SURGERY Left 08/18/2015   repair eardrum and reconstruction   OVARY SURGERY  40 years ago   wedge   PACEMAKER IMPLANT N/A 10/12/2021   Procedure: PACEMAKER IMPLANT;  Surgeon: Marinus Maw, MD;  Location: [facility] INVASIVE CV LAB;  Service: Cardiovascular;  Laterality: N/A;   TOTAL HIP ARTHROPLASTY Right 07/30/2014   Procedure: TOTAL HIP ARTHROPLASTY ANTERIOR APPROACH;  Surgeon: Marcene Corning, MD;  Location: [facility] OR;  Service: Orthopedics;  Laterality: Right;   TOTAL KNEE ARTHROPLASTY Left 11/04/2015   TOTAL KNEE ARTHROPLASTY Left 11/04/2015   Procedure: TOTAL KNEE ARTHROPLASTY;  Surgeon: Marcene Corning, MD;  Location: [facility] OR;  Service: Orthopedics;  Laterality: Left;   TYMPANOPLASTY  30 years ago    Allergies  Allergies  Allergen Reactions   Fenofibrate Other (See Comments)    elevated LFTs   Ondansetron Other (See Comments)    drug interaction    Home Medications    Prior to Admission medications   Medication Sig Start Date End Date Taking? Authorizing Provider  acetaminophen (TYLENOL) 500 MG tablet Take 1,000 mg by mouth every 6 (six) hours as needed for moderate pain or mild pain.    [provider]  apixaban (ELIQUIS) 5 MG TABS tablet TAKE 1 TABLET BY MOUTH TWICE A DAY 10/04/22   Jake Bathe, MD  ascorbic acid (VITAMIN C) 500 MG tablet Take 500 mg by mouth daily.    [provider]  Biotin 5000 MCG TABS Take 5,000 Units by mouth daily with breakfast.    [provider]  Cholecalciferol (VITAMIN D-3) 1000 UNITS CAPS Take 2,000 Units by mouth daily with breakfast.    [provider]  cyanocobalamin 1000 MCG tablet Take 1,000 mcg by mouth daily.     [provider]  diclofenac Sodium (VOLTAREN) 1 % GEL Apply 2 g topically daily as needed (pain).    [provider]  dofetilide (TIKOSYN) 125 MCG capsule Take 1 capsule (125 mcg total) by mouth 2 (two) times daily. 06/17/22   Marinus Maw, MD  ferrous sulfate 325 (65 FE) MG tablet Take 325 mg by mouth daily with breakfast.    [provider]  furosemide (LASIX) 40 MG tablet Take 20 mg by mouth as needed (Increased weight gain).    [provider]  gabapentin (NEURONTIN) 300 MG capsule Take 300 mg by mouth 2 (two) times daily. 12/02/20   [provider]  hydrALAZINE (APRESOLINE) 25 MG tablet TAKE 1 TABLET(25 MG) BY MOUTH IN THE MORNING AND AT BEDTIME 12/14/21   Dyann Kief, PA-C  hydrocortisone cream 1 % Apply 1 Application topically daily as needed for itching (irritation). Ultra Moisturizing    [provider]  iron polysaccharides (NIFEREX) 150 MG  capsule Take 150 mg by mouth 2 (two) times daily.    [provider]  lidocaine (LMX) 4 % cream Apply 1 Application topically daily as needed (leg pain).    [provider]  LORazepam (ATIVAN) 1 MG tablet Take 1 mg by mouth 2 (two) times daily as needed for anxiety.    [provider]  magnesium oxide (MAG-OX) 400 (240 Mg) MG tablet TAKE 1 TABLET BY MOUTH EVERY DAY 06/22/22   Marinus Maw, MD  meclizine (ANTIVERT) 12.5 MG tablet Take 1 tablet (12.5 mg total) by mouth daily as needed for dizziness. 12/10/20   Arty Baumgartner, NP  Melatonin 10 MG TABS Take 10 mg by mouth at bedtime as needed (Sleep).    [provider]  neomycin-polymyxin b-dexamethasone (MAXITROL) 3.5-10000-0.1 SUSP Place 1 drop into the left eye 4 (four) times daily. 10/09/21   [provider]  Omega-3 Fatty Acids (OMEGA 3 500 PO) Take 1 tablet by mouth daily. Multi omega 3    [provider]  OVER THE COUNTER MEDICATION Take 1 tablet by mouth daily. Health brain all day focus    [provider]  OVER THE COUNTER MEDICATION Take 1 capsule by mouth daily. Balance of  Nature/Fruits&Vegetables    [provider]  pantoprazole (PROTONIX) 20 MG tablet Take 20 mg by mouth daily at 6 (six) AM.    [provider]  potassium chloride (KLOR-CON) 10 MEQ tablet Take 40 mEq by mouth daily.    [provider]  Probiotic Product ([employer] PROBIOTIC) CHEW Chew 1 tablet by mouth daily at 6 (six) AM.    [provider]  pyridOXINE (VITAMIN B-6) 100 MG tablet Take 100 mg by mouth daily.    [provider]  rosuvastatin (CRESTOR) 40 MG tablet Take 1 tablet (40 mg total) by mouth daily at 6 PM. 11/26/19   Lyn Records, MD  tetrahydrozoline 0.05 % ophthalmic solution Place 1 drop into both eyes daily as needed (dry eyes).    [provider]  timolol (TIMOPTIC) 0.25 % ophthalmic solution 1 drop. In affected eye twice per day    [provider]  valsartan (DIOVAN) 320 MG tablet Take 1 tablet (320 mg total) by mouth daily. Patient taking differently: Take 320 mg by mouth every evening. 04/23/21   Lyn Records, MD  vitamin E 400 UNIT capsule Take 400 Units by mouth daily.    [provider]    Physical Exam    Vital Signs:  Sheila Gardner does not have vital signs available for review today.  Given telephonic nature of communication, physical exam is limited. AAOx3. NAD. Normal affect.  Speech and respirations are unlabored.  Accessory Clinical Findings    None  Assessment & Plan    1.  Preoperative Cardiovascular Risk Assessment:  According to the Revised Cardiac Risk Index (RCRI), her Perioperative Risk of Major Cardiac Event is (%): 0.4  Her Functional Capacity in METs is: 7.25 according to the Duke Activity Status Index (DASI).   The patient was advised that if she develops new symptoms prior to surgery to contact our office to arrange for a follow-up visit, and she verbalized understanding.   Per office protocol, patient can hold Eliquis for 3 days prior to procedure.   A copy of this note will  be routed to requesting surgeon.  Time:   Today, I have spent 10 minutes with the patient with telehealth technology discussing medical history, symptoms, and  management plan.     Joni Reining, NP  02/16/2023, 2:20 PM

## 2023-02-22 DIAGNOSIS — M25562 Pain in left knee: Secondary | ICD-10-CM | POA: Diagnosis not present

## 2023-03-01 ENCOUNTER — Ambulatory Visit (HOSPITAL_COMMUNITY): Payer: PPO

## 2023-03-03 ENCOUNTER — Other Ambulatory Visit: Payer: Self-pay | Admitting: Family Medicine

## 2023-03-03 DIAGNOSIS — Z1231 Encounter for screening mammogram for malignant neoplasm of breast: Secondary | ICD-10-CM

## 2023-03-03 NOTE — Discharge Instructions (Addendum)
Post Procedure Spinal Discharge Instruction Sheet  You may resume a regular diet and any medications that you routinely take (including pain medications) unless otherwise noted by MD.  No driving day of procedure.  Light activity throughout the rest of the day.  Do not do any strenuous work, exercise, bending or lifting.  The day following the procedure, you can resume normal physical activity but you should refrain from exercising or physical therapy for at least three days thereafter.  You may apply ice to the injection site, 20 minutes on, 20 minutes off, as needed. Do not apply ice directly to skin.    Common Side Effects:  Headaches- take your usual medications as directed by your physician.  Increase your fluid intake.  Caffeinated beverages may be helpful.  Lie flat in bed until your headache resolves.  Restlessness or inability to sleep- you may have trouble sleeping for the next few days.  Ask your referring physician if you need any medication for sleep.  Facial flushing or redness- should subside within a few days.  Increased pain- a temporary increase in pain a day or two following your procedure is not unusual.  Take your pain medication as prescribed by your referring physician.  Leg cramps  Please contact our office at [phone] for the following symptoms: Fever greater than 100 degrees. Headaches unresolved with medication after 2-3 days. Increased swelling, pain, or redness at injection site.   Thank you for visiting [facility] today.   YOU MAY RESUME YOUR ELIQUIS IN 48 HOURS.

## 2023-03-04 ENCOUNTER — Ambulatory Visit
Admission: RE | Admit: 2023-03-04 | Discharge: 2023-03-04 | Disposition: A | Discharge status: Home / Self Care | Payer: PPO | Source: Ambulatory Visit | Attending: Physical Medicine and Rehabilitation | Admitting: Physical Medicine and Rehabilitation

## 2023-03-04 ENCOUNTER — Other Ambulatory Visit: Payer: Self-pay | Admitting: Physical Medicine and Rehabilitation

## 2023-03-04 DIAGNOSIS — M5126 Other intervertebral disc displacement, lumbar region: Secondary | ICD-10-CM | POA: Diagnosis not present

## 2023-03-04 DIAGNOSIS — M47816 Spondylosis without myelopathy or radiculopathy, lumbar region: Secondary | ICD-10-CM | POA: Diagnosis not present

## 2023-03-04 DIAGNOSIS — M51369 Other intervertebral disc degeneration, lumbar region without mention of lumbar back pain or lower extremity pain: Secondary | ICD-10-CM | POA: Diagnosis not present

## 2023-03-04 DIAGNOSIS — M4316 Spondylolisthesis, lumbar region: Secondary | ICD-10-CM | POA: Diagnosis not present

## 2023-03-04 DIAGNOSIS — M5416 Radiculopathy, lumbar region: Secondary | ICD-10-CM

## 2023-03-04 MED ORDER — IOPAMIDOL (ISOVUE-M 200) INJECTION 41%
1.0000 mL | Freq: Once | INTRAMUSCULAR | Status: AC
  Administered 2023-03-04: 1 mL via EPIDURAL

## 2023-03-04 MED ORDER — METHYLPREDNISOLONE ACETATE 40 MG/ML INJ SUSP (RADIOLOG
80.0000 mg | Freq: Once | INTRAMUSCULAR | Status: AC
  Administered 2023-03-04: 80 mg via EPIDURAL

## 2023-03-11 ENCOUNTER — Ambulatory Visit: Payer: PPO | Admitting: Cardiology

## 2023-03-15 ENCOUNTER — Other Ambulatory Visit: Payer: Self-pay | Admitting: Cardiology

## 2023-03-15 DIAGNOSIS — I480 Paroxysmal atrial fibrillation: Secondary | ICD-10-CM

## 2023-03-15 NOTE — Telephone Encounter (Signed)
 Prescription refill request for Eliquis received. Indication:pacer Last office visit:8/24 Scr:0.83  4/24 Age: 88 Weight:84.4  kg  Prescription refilled

## 2023-03-31 ENCOUNTER — Telehealth: Payer: Self-pay

## 2023-03-31 NOTE — Telephone Encounter (Signed)
Pt called in wanting to send a transmission because she has been having knee pain since November of 2024 and has been stressed and wants to make sure nothing is on her monitor. Pt is aware that once we get the transmission we will give her a call back

## 2023-03-31 NOTE — Telephone Encounter (Signed)
Waiting on transmission. 

## 2023-03-31 NOTE — Telephone Encounter (Signed)
Remote transmission received. Normal device function. Pt contacted to advise findings. Pt appreciative of call.

## 2023-04-07 DIAGNOSIS — H401131 Primary open-angle glaucoma, bilateral, mild stage: Secondary | ICD-10-CM | POA: Diagnosis not present

## 2023-04-14 ENCOUNTER — Telehealth: Payer: Self-pay | Admitting: Cardiology

## 2023-04-14 ENCOUNTER — Ambulatory Visit (INDEPENDENT_AMBULATORY_CARE_PROVIDER_SITE_OTHER): Payer: PPO

## 2023-04-14 DIAGNOSIS — I495 Sick sinus syndrome: Secondary | ICD-10-CM | POA: Diagnosis not present

## 2023-04-14 LAB — CUP PACEART REMOTE DEVICE CHECK
Battery Remaining Longevity: 118 mo
Battery Voltage: 3.02 V
Brady Statistic AP VP Percent: 79.33 %
Brady Statistic AP VS Percent: 0.07 %
Brady Statistic AS VP Percent: 4.27 %
Brady Statistic AS VS Percent: 16.33 %
Brady Statistic RA Percent Paced: 79.55 %
Brady Statistic RV Percent Paced: 83.6 %
Date Time Interrogation Session: 20250206024325
Implantable Lead Connection Status: 753985
Implantable Lead Connection Status: 753985
Implantable Lead Implant Date: 20230807
Implantable Lead Implant Date: 20230807
Implantable Lead Location: 753859
Implantable Lead Location: 753860
Implantable Lead Model: 3830
Implantable Lead Model: 5076
Implantable Pulse Generator Implant Date: 20230807
Lead Channel Impedance Value: 418 Ohm
Lead Channel Impedance Value: 570 Ohm
Lead Channel Impedance Value: 570 Ohm
Lead Channel Impedance Value: 703 Ohm
Lead Channel Pacing Threshold Amplitude: 0.625 V
Lead Channel Pacing Threshold Amplitude: 0.625 V
Lead Channel Pacing Threshold Pulse Width: 0.4 ms
Lead Channel Pacing Threshold Pulse Width: 0.4 ms
Lead Channel Sensing Intrinsic Amplitude: 19.375 mV
Lead Channel Sensing Intrinsic Amplitude: 19.375 mV
Lead Channel Sensing Intrinsic Amplitude: 4.375 mV
Lead Channel Sensing Intrinsic Amplitude: 4.375 mV
Lead Channel Setting Pacing Amplitude: 2 V
Lead Channel Setting Pacing Amplitude: 2.5 V
Lead Channel Setting Pacing Pulse Width: 0.4 ms
Lead Channel Setting Sensing Sensitivity: 1.2 mV
Zone Setting Status: 755011

## 2023-04-14 NOTE — Telephone Encounter (Signed)
 Pt c/o Shortness Of Breath: STAT if SOB developed within the last 24 hours or pt is noticeably SOB on the phone  1. Are you currently SOB (can you hear that pt is SOB on the phone)? No  2. How long have you been experiencing SOB? For weeks  3. Are you SOB when sitting or when up moving around? Sitting and moving  4. Are you currently experiencing any other symptoms? Patient stated she is having issues with feeling nauseous, dizziness, and sob. Patient is very concerned.

## 2023-04-14 NOTE — Telephone Encounter (Signed)
 Spoke with patient and she states her appointment was canceled with Dr.Skains and they have scheduled her so far out but she needs to be seen sooner. She has been having SOB w/exertion. She stated it has been getting worse. Some chest tightness off and on.  Did inform patient if she is experiencing chest and SOB she needs to call 911 and go to the ED.   She is currently asymptomatic. Appointment scheduled with Dr. Jeffrie on Tuesday at 1:20. ED precautions discussed

## 2023-04-15 ENCOUNTER — Encounter: Payer: Self-pay | Admitting: Internal Medicine

## 2023-04-18 ENCOUNTER — Telehealth: Payer: Self-pay | Admitting: Cardiology

## 2023-04-18 NOTE — Telephone Encounter (Signed)
 Noted -will follow up tomorrow for further evaluation.

## 2023-04-18 NOTE — Telephone Encounter (Signed)
 STAT if patient feels like he/she is going to faint   1. Are you feeling dizzy, lightheaded, or faint right now? No     2. Have you passed out?  No (If yes move to .SYNCOPECHMG)   3. Do you have any other symptoms? no   4. Have you checked your HR and BP (record if available)?  Pt called paramedics this morning because she almost passed out. They told her that her vitals were fine

## 2023-04-18 NOTE — Telephone Encounter (Signed)
 Spoke with Pt. Pt has appointment with Dr Renna Cary tomorrow and call was more for Dr Renna Cary FYI than for Dr Carolynne Citron. Pt states she became dizzy while in the shower this morning. After getting out of shower she sat down to call her son. Once off the phone pt stood and felt as if she were going to pass out and had nausea. Pt called son and then 911. 911 came out and said vitals were good but her glucose level was 179.  Told pt I would sent this message over to Dr Renna Cary for his review for tomorrows visit but that I would also recommend calling her PCP to check on her glucose levels. Gave 911/ED precautions. Pt stated understanding.

## 2023-04-19 ENCOUNTER — Ambulatory Visit: Payer: PPO | Admitting: Cardiology

## 2023-04-19 ENCOUNTER — Ambulatory Visit: Payer: PPO | Attending: Cardiology | Admitting: Cardiology

## 2023-04-19 ENCOUNTER — Encounter: Payer: Self-pay | Admitting: Cardiology

## 2023-04-19 VITALS — BP 124/58 | HR 60 | Ht 63.0 in | Wt 185.4 lb

## 2023-04-19 DIAGNOSIS — Z09 Encounter for follow-up examination after completed treatment for conditions other than malignant neoplasm: Secondary | ICD-10-CM | POA: Diagnosis not present

## 2023-04-19 DIAGNOSIS — I480 Paroxysmal atrial fibrillation: Secondary | ICD-10-CM

## 2023-04-19 DIAGNOSIS — I495 Sick sinus syndrome: Secondary | ICD-10-CM

## 2023-04-19 DIAGNOSIS — Z950 Presence of cardiac pacemaker: Secondary | ICD-10-CM

## 2023-04-19 DIAGNOSIS — R0602 Shortness of breath: Secondary | ICD-10-CM

## 2023-04-19 DIAGNOSIS — I2510 Atherosclerotic heart disease of native coronary artery without angina pectoris: Secondary | ICD-10-CM | POA: Diagnosis not present

## 2023-04-19 NOTE — Telephone Encounter (Signed)
Pt seen and evaluated 04/19/23 by Dr Anne Fu.  Please see that note for further documentation.

## 2023-04-19 NOTE — Progress Notes (Signed)
Cardiology Office Note:  .   Date:  04/19/2023  ID:  Sheila Gardner, DOB October 01, 1935, MRN [id number] PCP: Daisy Floro, MD  [facility] Providers Cardiologist:  Donato Schultz, MD Electrophysiologist:  Lewayne Bunting, MD     History of Present Illness: .   Sheila Gardner is a 88 y.o. female Discussed with the use of AI scribe  History of Present Illness   Sheila Gardner is an 88 year old female with paroxysmal atrial fibrillation who presents for follow-up. She is accompanied by her daughter.  She has been maintained on dofetilide and experiences rare palpitations. She underwent pacemaker insertion and maintains sinus rhythm. An echocardiogram on November 22, 2022, showed an ejection fraction of 65%, normal right ventricular function, no aortic valve stenosis, and moderate mitral annular calcification, with no significant change from the prior study. In 2022, she had a left heart catheterization that revealed moderate coronary artery disease with a widely patent stent from the mid-distal left main to mid LAD.  She experiences significant shortness of breath, fatigue, and decreased ability to perform activities. These symptoms have persisted for about six to eight weeks. She has not been able to drive due to a bulging disc in her leg, causing significant pain, and is concerned about the stress on her heart due to this pain.  She experiences nausea, which she attributes to her medications. She takes hydrocodone for pain and has been on potassium supplements since her stent placement. She questions whether her nausea could be related to her potassium or furosemide use. She has been taking furosemide since her stent placement to manage fluid retention.  She has chronic pain, particularly in her left leg, due to a bulging disc. She is currently taking hydrocodone for pain management. The pain has limited her mobility and ability to perform daily activities.  She lives alone but has supportive  children. She prepares her meals and attempts light housework but finds it challenging due to her symptoms. She has not smoked, except for a few cigarettes as a teenager.          Studies Reviewed: Marland Kitchen   EKG Interpretation Date/Time:  Tuesday April 19 2023 13:21:20 EST Ventricular Rate:  60 PR Interval:  158 QRS Duration:  134 QT Interval:  468 QTC Calculation: 468 R Axis:   -29  Text Interpretation: AV dual-paced rhythm When compared with ECG of 01-Nov-2022 10:26, No significant change was found Confirmed by Donato Schultz ([id number]) on 04/19/2023 1:25:17 PM    Results   LABS Potassium: 4.7 (04/12/2023)  DIAGNOSTIC Echocardiogram: EF 65%, normal RV function, no aortic valve stenosis, moderate mitral annular calcification (11/22/2022) Left heart catheterization: Moderate CAD, widely patent mid to distal left main to mid LAD stent (2022) EKG: Normal (04/19/2023)     Risk Assessment/Calculations:            Physical Exam:   VS:  BP (!) 124/58   Pulse 60   Ht 5\' 3"  (1.6 m)   Wt 185 lb 6.4 oz (84.1 kg)   SpO2 96%   BMI 32.84 kg/m    Wt Readings from Last 3 Encounters:  04/19/23 185 lb 6.4 oz (84.1 kg)  11/01/22 186 lb (84.4 kg)  08/30/22 183 lb (83 kg)    GEN: Well nourished, well developed in no acute distress NECK: No JVD; No carotid bruits CARDIAC: RRR, no murmurs, no rubs, no gallops RESPIRATORY:  Clear to auscultation without rales, wheezing or rhonchi  ABDOMEN: Soft, non-tender,  non-distended EXTREMITIES:  No edema; No deformity   ASSESSMENT AND PLAN: .    Assessment and Plan    Paroxysmal Atrial Fibrillation [age]-year-old female with paroxysmal atrial fibrillation, maintained on dofetilide. Rare palpitations, pacemaker insertion. Echocardiogram on 09/16: EF 65%, normal RV function, no aortic valve stenosis, moderate mitral annular calcification. No significant change from prior study. EKG today: good sinus rhythm, pacemaker functioning well. - Continue  dofetilide - Annual follow-up with cardiology  Shortness of Breath Significant shortness of breath, fatigue, and limited activity. Likely deconditioning due to limited mobility from bulging disc in left leg. Cardiac function normal (recent echocardiogram and EKG).  - Encourage gradual increase in physical activity as tolerated  Nausea Persistent nausea, possibly medication-induced (hydrocodone, potassium supplements). Discussed discontinuing potassium supplements, making furosemide (Lasix) PRN for weight gain of 3-5 pounds. If Lasix is taken, one 10 mEq potassium supplement should be taken. - Discontinue potassium supplements - Make furosemide (Lasix) PRN for weight gain of 3-5 pounds - Monitor weight daily - If Lasix is taken, take one 10 mEq potassium supplement  Chronic Pain due to Bulging Disc Bulging disc in left leg causing significant pain and limiting mobility. Managed with hydrocodone and tramadol. Discussed potential referral to pain management specialist if symptoms persist. - Continue current pain management regimen - Consider referral to pain management specialist if symptoms persist  General Health Maintenance Generally active, no smoking history. Discussed importance of maintaining physical activity and monitoring health. - Encourage regular physical activity as tolerated - Annual follow-up with primary care physician  Follow-up - Follow up with Dr. Tenny Craw  - Annual follow-up with cardiology - Return to clinic in one year for follow-up.      Prior cardiac catheterization reviewed as well as echocardiogram.  There is no evidence of elevated cardiac pressures.  She is not developed any improvement in symptoms after longstanding furosemide.  We will use this as needed and potassium as needed as well.  Potassium can contribute to nausea as well as hydrocodone and tramadol.  Okay to stop Prilosec at this point as she was on this for many years.  Ultimately though I asked her to  discuss with Dr. Tenny Craw.  Conditioning likely playing a significant role in her symptomatology at this point.        Signed, Donato Schultz, MD

## 2023-04-19 NOTE — Telephone Encounter (Signed)
Pt has been seen and evaluated today by Dr Anne Fu.  Please see that visit of documentation.

## 2023-04-19 NOTE — Patient Instructions (Signed)
Medication Instructions:  Please take Furosemide and potassium as needed. Please discontinue your Prilosec. Continue all other medications as listed.  *If you need a refill on your cardiac medications before your next appointment, please call your pharmacy*   Follow-Up: At [facility], you and your health needs are our priority.  As part of our continuing mission to provide you with exceptional heart care, we have created designated Provider Care Teams.  These Care Teams include your primary Cardiologist (physician) and Advanced Practice Providers (APPs -  Physician Assistants and Nurse Practitioners) who all work together to provide you with the care you need, when you need it.  We recommend signing up for the patient portal called "MyChart".  Sign up information is provided on this After Visit Summary.  MyChart is used to connect with patients for Virtual Visits (Telemedicine).  Patients are able to view lab/test results, encounter notes, upcoming appointments, etc.  Non-urgent messages can be sent to your provider as well.   To learn more about what you can do with MyChart, go to [email or web address].    Your next appointment:   1 year(s)  Provider:   Donato Schultz, MD

## 2023-05-02 ENCOUNTER — Other Ambulatory Visit: Payer: Self-pay | Admitting: Cardiology

## 2023-05-02 DIAGNOSIS — I480 Paroxysmal atrial fibrillation: Secondary | ICD-10-CM

## 2023-05-02 NOTE — Telephone Encounter (Signed)
 Prescription refill request for Eliquis received. Indication:afib Last office visit:2/25 Scr:0.83  4/24 Age: 88 Weight:84.1  kg  Prescription refilled

## 2023-05-16 DIAGNOSIS — M5459 Other low back pain: Secondary | ICD-10-CM | POA: Diagnosis not present

## 2023-05-16 DIAGNOSIS — R262 Difficulty in walking, not elsewhere classified: Secondary | ICD-10-CM | POA: Diagnosis not present

## 2023-05-16 DIAGNOSIS — M79605 Pain in left leg: Secondary | ICD-10-CM | POA: Diagnosis not present

## 2023-05-18 DIAGNOSIS — R262 Difficulty in walking, not elsewhere classified: Secondary | ICD-10-CM | POA: Diagnosis not present

## 2023-05-18 DIAGNOSIS — M79605 Pain in left leg: Secondary | ICD-10-CM | POA: Diagnosis not present

## 2023-05-18 DIAGNOSIS — M5459 Other low back pain: Secondary | ICD-10-CM | POA: Diagnosis not present

## 2023-05-19 NOTE — Progress Notes (Signed)
 Remote pacemaker transmission.

## 2023-05-24 ENCOUNTER — Ambulatory Visit: Payer: PPO | Attending: Internal Medicine | Admitting: Internal Medicine

## 2023-05-24 ENCOUNTER — Encounter: Payer: Self-pay | Admitting: Internal Medicine

## 2023-05-24 VITALS — BP 143/68 | HR 60 | Ht 63.0 in | Wt 188.2 lb

## 2023-05-24 DIAGNOSIS — Z950 Presence of cardiac pacemaker: Secondary | ICD-10-CM | POA: Diagnosis not present

## 2023-05-24 DIAGNOSIS — I480 Paroxysmal atrial fibrillation: Secondary | ICD-10-CM | POA: Diagnosis not present

## 2023-05-24 NOTE — Patient Instructions (Signed)
 Medication Instructions:  Your physician recommends that you continue on your current medications as directed. Please refer to the Current Medication list given to you today. *If you need a refill on your cardiac medications before your next appointment, please call your pharmacy*   Lab Work: CBC, BMET, TSH, and Free T4 - please have lab work completed today at [facility] on the first floor of our building If you have labs (blood work) drawn today and your tests are completely normal, you will receive your results only by: [employer] (if you have MyChart) OR A paper copy in the mail If you have any lab test that is abnormal or we need to change your treatment, we will call you to review the results.   Follow-Up: At [facility], you and your health needs are our priority.  As part of our continuing mission to provide you with exceptional heart care, we have created designated Provider Care Teams.  These Care Teams include your primary Cardiologist (physician) and Advanced Practice Providers (APPs -  Physician Assistants and Nurse Practitioners) who all work together to provide you with the care you need, when you need it.  We recommend signing up for the patient portal called "MyChart".  Sign up information is provided on this After Visit Summary.  MyChart is used to connect with patients for Virtual Visits (Telemedicine).  Patients are able to view lab/test results, encounter notes, upcoming appointments, etc.  Non-urgent messages can be sent to your provider as well.   To learn more about what you can do with MyChart, go to [email or web address].    Your next appointment:   1 year(s)  Provider:   You will see one of the following Advanced Practice Providers on your designated Care Team:   Francis Dowse, [place]

## 2023-05-24 NOTE — Progress Notes (Signed)
 HPI Sheila Gardner returns today for followup. She is a pleasant 88 yo woman with a h/o PAF who has been maintained on dofetilide. She lost almost 10 lbs. She has had rare palpitations. She has class 2 dyspnea. She underwent DDD PM insertion 1.5 years ago. She still has occaisional periods of dizziness. She tries to be active. Her biggest complaint over the past few months has been progressive dyspnea with exertion. An echo done 6 months ago shows preserved LV function. She has had minimal atrial fib.  Allergies  Allergen Reactions   Fenofibrate Other (See Comments)    elevated LFTs   Ondansetron Other (See Comments)    drug interaction     Current Outpatient Medications  Medication Sig Dispense Refill   ascorbic acid (VITAMIN C) 500 MG tablet Take 500 mg by mouth daily.     Biotin 5000 MCG TABS Take 5,000 Units by mouth daily with breakfast.     Cholecalciferol (VITAMIN D-3) 1000 UNITS CAPS Take 2,000 Units by mouth daily with breakfast.     cyanocobalamin 1000 MCG tablet Take 1,000 mcg by mouth daily.      diclofenac Sodium (VOLTAREN) 1 % GEL Apply 2 g topically daily as needed (pain).     dofetilide (TIKOSYN) 125 MCG capsule Take 1 capsule (125 mcg total) by mouth 2 (two) times daily. 180 capsule 3   ELIQUIS 5 MG TABS tablet TAKE 1 TABLET BY MOUTH TWICE A DAY 180 tablet 1   ferrous sulfate 325 (65 FE) MG tablet Take 325 mg by mouth daily with breakfast.     furosemide (LASIX) 40 MG tablet Take 20 mg by mouth as needed (Increased weight gain).     gabapentin (NEURONTIN) 300 MG capsule Take 300 mg by mouth 2 (two) times daily.     hydrALAZINE (APRESOLINE) 25 MG tablet TAKE 1 TABLET(25 MG) BY MOUTH IN THE MORNING AND AT BEDTIME 180 tablet 3   HYDROcodone-acetaminophen (NORCO/VICODIN) 5-325 MG tablet Take 1 tablet by mouth at bedtime as needed.     hydrocortisone cream 1 % Apply 1 Application topically daily as needed for itching (irritation). Ultra Moisturizing     iron  polysaccharides (NIFEREX) 150 MG capsule Take 150 mg by mouth 2 (two) times daily.     lidocaine (LMX) 4 % cream Apply 1 Application topically daily as needed (leg pain).     LORazepam (ATIVAN) 1 MG tablet Take 1 mg by mouth 2 (two) times daily as needed for anxiety.     magnesium oxide (MAG-OX) 400 (240 Mg) MG tablet TAKE 1 TABLET BY MOUTH EVERY DAY 90 tablet 3   meclizine (ANTIVERT) 12.5 MG tablet Take 1 tablet (12.5 mg total) by mouth daily as needed for dizziness. 30 tablet 0   Omega-3 Fatty Acids (OMEGA 3 500 PO) Take 1 tablet by mouth daily. Multi omega 3     OVER THE COUNTER MEDICATION Take 1 tablet by mouth daily. Health brain all day focus     OVER THE COUNTER MEDICATION Take 1 capsule by mouth daily. Balance of Nature/Fruits&Vegetables     Probiotic Product ([employer] PROBIOTIC) CHEW Chew 1 tablet by mouth daily at 6 (six) AM.     pyridOXINE (VITAMIN B-6) 100 MG tablet Take 100 mg by mouth daily.     rosuvastatin (CRESTOR) 40 MG tablet Take 1 tablet (40 mg total) by mouth daily at 6 PM. 30 tablet 6   timolol (TIMOPTIC) 0.25 % ophthalmic solution 1 drop. In affected  eye twice per day     traMADol (ULTRAM) 50 MG tablet Take 50 mg by mouth every 6 (six) hours as needed.     valsartan (DIOVAN) 320 MG tablet Take 1 tablet (320 mg total) by mouth daily. (Patient taking differently: Take 320 mg by mouth every evening.) 90 tablet 3   vitamin E 400 UNIT capsule Take 400 Units by mouth daily.     potassium chloride (KLOR-CON) 10 MEQ tablet Take 40 mEq by mouth as needed. Take as needed with Furosemide (Patient not taking: Reported on 05/24/2023)     No current facility-administered medications for this visit.     Past Medical History:  Diagnosis Date   Arthritis    Atrial fibrillation (HCC)    Breast cancer (HCC)    Cancer (HCC)    Breast- rt   Cough with sputum 2016   Elevated liver function tests 05/09/2013   GERD (gastroesophageal reflux disease)    COSTOCHONDRITIS   Heart murmur    Hip  pain 05/08/2014   Hyperlipidemia    Hypertension    Lipoma    back of neck   PONV (postoperative nausea and vomiting)    only after rt hip surgery   Primary osteoarthritis of left knee 11/04/2015   Primary osteoarthritis of right hip 07/30/2014   Squamous cell carcinoma of skin 08/21/2019   atypical squa. proliferation-Left corner of mouth    ROS:   All systems reviewed and negative except as noted in the HPI.   Past Surgical History:  Procedure Laterality Date   ANKLE FRACTURE SURGERY Right 2013   RIGHT    BREAST LUMPECTOMY Right 2011   BREAST SURGERY  04/22/2009   Rt lumpectomy   CARDIOVERSION N/A 02/19/2019   Procedure: CARDIOVERSION;  Surgeon: Lars Masson, MD;  [facility] ENDOSCOPY;  Service: Cardiovascular;  Laterality: N/A;   CORONARY ATHERECTOMY N/A 04/30/2019   Procedure: CORONARY ATHERECTOMY;  Surgeon: Lyn Records, MD;  Location: [facility] INVASIVE CV LAB;  Service: Cardiovascular;  Laterality: N/A;  LAD   CORONARY STENT INTERVENTION N/A 04/30/2019   Procedure: CORONARY STENT INTERVENTION;  Surgeon: Lyn Records, MD;  Location: [facility] INVASIVE CV LAB;  Service: Cardiovascular;  Laterality: N/A;  DES TO PROX-MID LAD   EYE SURGERY  2001   macular hole   JOINT REPLACEMENT  2016   KNEE SURGERY     LEFT HEART CATH AND CORONARY ANGIOGRAPHY N/A 04/30/2019   Procedure: LEFT HEART CATH AND CORONARY ANGIOGRAPHY;  Surgeon: Lyn Records, MD;  Location: [facility] INVASIVE CV LAB;  Service: Cardiovascular;  Laterality: N/A;   LEFT HEART CATH AND CORONARY ANGIOGRAPHY N/A 12/08/2020   Procedure: LEFT HEART CATH AND CORONARY ANGIOGRAPHY;  Surgeon: Lennette Bihari, MD;  Location: [facility] INVASIVE CV LAB;  Service: Cardiovascular;  Laterality: N/A;   LOOP RECORDER REMOVAL N/A 10/12/2021   Procedure: LOOP RECORDER REMOVAL;  Surgeon: Marinus Maw, MD;  Location: [facility] INVASIVE CV LAB;  Service: Cardiovascular;  Laterality: N/A;   MIDDLE EAR SURGERY Left 08/18/2015   repair eardrum and reconstruction    OVARY SURGERY  40 years ago   wedge   PACEMAKER IMPLANT N/A 10/12/2021   Procedure: PACEMAKER IMPLANT;  Surgeon: Marinus Maw, MD;  Location: [facility] INVASIVE CV LAB;  Service: Cardiovascular;  Laterality: N/A;   TOTAL HIP ARTHROPLASTY Right 07/30/2014   Procedure: TOTAL HIP ARTHROPLASTY ANTERIOR APPROACH;  Surgeon: Marcene Corning, MD;  Location: [facility] OR;  Service: Orthopedics;  Laterality: Right;   TOTAL KNEE  ARTHROPLASTY Left 11/04/2015   TOTAL KNEE ARTHROPLASTY Left 11/04/2015   Procedure: TOTAL KNEE ARTHROPLASTY;  Surgeon: Marcene Corning, MD;  Location: [facility] OR;  Service: Orthopedics;  Laterality: Left;   TYMPANOPLASTY  30 years ago     Family History  Problem Relation Age of Onset   Heart disease Mother    Cancer Brother        colon, pancreatic, brain     Social History   Socioeconomic History   Marital status: Widowed    Spouse name: Not on file   Number of children: Not on file   Years of education: Not on file   Highest education level: Not on file  Occupational History   Not on file  Tobacco Use   Smoking status: Never   Smokeless tobacco: Never  Vaping Use   Vaping status: Never Used  Substance and Sexual Activity   Alcohol use: Not Currently   Drug use: No   Sexual activity: Not Currently    Birth control/protection: None  Other Topics Concern   Not on file  Social History Narrative   Not on file   Social Drivers of Health   Financial Resource Strain: Not on file  Food Insecurity: Not on file  Transportation Needs: Not on file  Physical Activity: Not on file  Stress: Not on file  Social Connections: Not on file  Intimate Partner Violence: Not on file     BP (!) 143/68   Pulse 60   Ht 5\' 3"  (1.6 m)   Wt 188 lb 3.2 oz (85.4 kg)   SpO2 99%   BMI 33.34 kg/m   Physical Exam:  Well appearing NAD HEENT: Unremarkable Neck:  No JVD, no thyromegally Lymphatics:  No adenopathy Back:  No CVA tenderness Lungs:  Clear with no wheezes HEART:  Regular rate  rhythm, no murmurs, no rubs, no clicks Abd:  soft, positive bowel sounds, no organomegally, no rebound, no guarding Ext:  2 plus pulses, no edema, no cyanosis, no clubbing Skin:  No rashes no nodules Neuro:  CN II through XII intact, motor grossly intact  DEVICE  Normal device function.  See PaceArt for details.   Assess/Plan: Fatigue and weakness - I am unclear as to the etiology. I have asked her to get a CBC and a bmp and thyroid function. She has normal LV function by echo. HTN - her bp is minimally elevated. We will follow. Near syncope - SHe has had these symptoms for years and not due to brady or hypotension. I asked her to sit down or lie down if she feels her symptoms coming on. I suspect an inner ear component.  Sinus node dysfunction  - her Medtronic DDD PM is working normally. We will follow.  Sheila Gardner Date,MD

## 2023-05-25 LAB — BASIC METABOLIC PANEL
BUN/Creatinine Ratio: 28 (ref 12–28)
BUN: 15 mg/dL (ref 8–27)
CO2: 24 mmol/L (ref 20–29)
Calcium: 9.9 mg/dL (ref 8.7–10.3)
Chloride: 82 mmol/L — ABNORMAL LOW (ref 96–106)
Creatinine, Ser: 0.54 mg/dL — ABNORMAL LOW (ref 0.57–1.00)
Glucose: 117 mg/dL — ABNORMAL HIGH (ref 70–99)
Potassium: 3.9 mmol/L (ref 3.5–5.2)
Sodium: 123 mmol/L — ABNORMAL LOW (ref 134–144)
eGFR: 90 mL/min/{1.73_m2} (ref >59)

## 2023-05-25 LAB — CBC
Hematocrit: 33.4 % — ABNORMAL LOW (ref 34.0–46.6)
Hemoglobin: 11.3 g/dL (ref 11.1–15.9)
MCH: 31.2 pg (ref 26.6–33.0)
MCHC: 33.8 g/dL (ref 31.5–35.7)
MCV: 92 fL (ref 79–97)
Platelets: 279 10*3/uL (ref 150–450)
RBC: 3.62 x10E6/uL — ABNORMAL LOW (ref 3.77–5.28)
RDW: 12 % (ref 11.7–15.4)
WBC: 8.9 10*3/uL (ref 3.4–10.8)

## 2023-05-25 LAB — TSH: TSH: 1.96 u[IU]/mL (ref 0.450–4.500)

## 2023-05-25 LAB — T4, FREE: Free T4: 0.98 ng/dL (ref 0.82–1.77)

## 2023-05-27 DIAGNOSIS — R262 Difficulty in walking, not elsewhere classified: Secondary | ICD-10-CM | POA: Diagnosis not present

## 2023-05-27 DIAGNOSIS — M5459 Other low back pain: Secondary | ICD-10-CM | POA: Diagnosis not present

## 2023-05-27 DIAGNOSIS — M79605 Pain in left leg: Secondary | ICD-10-CM | POA: Diagnosis not present

## 2023-05-29 LAB — CUP PACEART INCLINIC DEVICE CHECK
Date Time Interrogation Session: 20250318132955
Implantable Lead Connection Status: 753985
Implantable Lead Connection Status: 753985
Implantable Lead Implant Date: 20230807
Implantable Lead Implant Date: 20230807
Implantable Lead Location: 753859
Implantable Lead Location: 753860
Implantable Lead Model: 3830
Implantable Lead Model: 5076
Implantable Pulse Generator Implant Date: 20230807

## 2023-05-30 DIAGNOSIS — M5459 Other low back pain: Secondary | ICD-10-CM | POA: Diagnosis not present

## 2023-05-30 DIAGNOSIS — R262 Difficulty in walking, not elsewhere classified: Secondary | ICD-10-CM | POA: Diagnosis not present

## 2023-05-30 DIAGNOSIS — M79605 Pain in left leg: Secondary | ICD-10-CM | POA: Diagnosis not present

## 2023-06-01 DIAGNOSIS — R262 Difficulty in walking, not elsewhere classified: Secondary | ICD-10-CM | POA: Diagnosis not present

## 2023-06-01 DIAGNOSIS — M79605 Pain in left leg: Secondary | ICD-10-CM | POA: Diagnosis not present

## 2023-06-01 DIAGNOSIS — M5459 Other low back pain: Secondary | ICD-10-CM | POA: Diagnosis not present

## 2023-06-06 DIAGNOSIS — M79605 Pain in left leg: Secondary | ICD-10-CM | POA: Diagnosis not present

## 2023-06-06 DIAGNOSIS — M5459 Other low back pain: Secondary | ICD-10-CM | POA: Diagnosis not present

## 2023-06-06 DIAGNOSIS — R262 Difficulty in walking, not elsewhere classified: Secondary | ICD-10-CM | POA: Diagnosis not present

## 2023-06-08 DIAGNOSIS — R262 Difficulty in walking, not elsewhere classified: Secondary | ICD-10-CM | POA: Diagnosis not present

## 2023-06-08 DIAGNOSIS — M79605 Pain in left leg: Secondary | ICD-10-CM | POA: Diagnosis not present

## 2023-06-08 DIAGNOSIS — M5459 Other low back pain: Secondary | ICD-10-CM | POA: Diagnosis not present

## 2023-06-13 ENCOUNTER — Other Ambulatory Visit: Payer: Self-pay

## 2023-06-13 DIAGNOSIS — M5459 Other low back pain: Secondary | ICD-10-CM | POA: Diagnosis not present

## 2023-06-13 DIAGNOSIS — M79605 Pain in left leg: Secondary | ICD-10-CM | POA: Diagnosis not present

## 2023-06-13 DIAGNOSIS — Z79899 Other long term (current) drug therapy: Secondary | ICD-10-CM

## 2023-06-13 DIAGNOSIS — R262 Difficulty in walking, not elsewhere classified: Secondary | ICD-10-CM | POA: Diagnosis not present

## 2023-06-13 NOTE — Progress Notes (Signed)
 bmp

## 2023-06-14 LAB — BASIC METABOLIC PANEL WITH GFR
BUN/Creatinine Ratio: 20 (ref 12–28)
BUN: 12 mg/dL (ref 8–27)
CO2: 22 mmol/L (ref 20–29)
Calcium: 10.1 mg/dL (ref 8.7–10.3)
Chloride: 97 mmol/L (ref 96–106)
Creatinine, Ser: 0.6 mg/dL (ref 0.57–1.00)
Glucose: 105 mg/dL — ABNORMAL HIGH (ref 70–99)
Potassium: 4.3 mmol/L (ref 3.5–5.2)
Sodium: 138 mmol/L (ref 134–144)
eGFR: 87 mL/min/{1.73_m2} (ref >59)

## 2023-06-15 DIAGNOSIS — M79605 Pain in left leg: Secondary | ICD-10-CM | POA: Diagnosis not present

## 2023-06-15 DIAGNOSIS — M5459 Other low back pain: Secondary | ICD-10-CM | POA: Diagnosis not present

## 2023-06-15 DIAGNOSIS — R262 Difficulty in walking, not elsewhere classified: Secondary | ICD-10-CM | POA: Diagnosis not present

## 2023-06-16 ENCOUNTER — Ambulatory Visit
Admission: RE | Admit: 2023-06-16 | Discharge: 2023-06-16 | Disposition: A | Discharge status: Home / Self Care | Payer: PPO | Source: Ambulatory Visit | Attending: Family Medicine | Admitting: Family Medicine

## 2023-06-16 DIAGNOSIS — Z1231 Encounter for screening mammogram for malignant neoplasm of breast: Secondary | ICD-10-CM | POA: Diagnosis not present

## 2023-06-17 DIAGNOSIS — D3132 Benign neoplasm of left choroid: Secondary | ICD-10-CM | POA: Diagnosis not present

## 2023-06-17 DIAGNOSIS — H401132 Primary open-angle glaucoma, bilateral, moderate stage: Secondary | ICD-10-CM | POA: Diagnosis not present

## 2023-06-17 DIAGNOSIS — H353132 Nonexudative age-related macular degeneration, bilateral, intermediate dry stage: Secondary | ICD-10-CM | POA: Diagnosis not present

## 2023-06-17 DIAGNOSIS — H35372 Puckering of macula, left eye: Secondary | ICD-10-CM | POA: Diagnosis not present

## 2023-06-17 DIAGNOSIS — H59811 Chorioretinal scars after surgery for detachment, right eye: Secondary | ICD-10-CM | POA: Diagnosis not present

## 2023-06-20 DIAGNOSIS — M9903 Segmental and somatic dysfunction of lumbar region: Secondary | ICD-10-CM | POA: Diagnosis not present

## 2023-06-20 DIAGNOSIS — M9904 Segmental and somatic dysfunction of sacral region: Secondary | ICD-10-CM | POA: Diagnosis not present

## 2023-06-20 DIAGNOSIS — M9905 Segmental and somatic dysfunction of pelvic region: Secondary | ICD-10-CM | POA: Diagnosis not present

## 2023-06-20 DIAGNOSIS — M51361 Other intervertebral disc degeneration, lumbar region with lower extremity pain only: Secondary | ICD-10-CM | POA: Diagnosis not present

## 2023-06-21 DIAGNOSIS — M51361 Other intervertebral disc degeneration, lumbar region with lower extremity pain only: Secondary | ICD-10-CM | POA: Diagnosis not present

## 2023-06-21 DIAGNOSIS — M9903 Segmental and somatic dysfunction of lumbar region: Secondary | ICD-10-CM | POA: Diagnosis not present

## 2023-06-21 DIAGNOSIS — M9904 Segmental and somatic dysfunction of sacral region: Secondary | ICD-10-CM | POA: Diagnosis not present

## 2023-06-21 DIAGNOSIS — M9905 Segmental and somatic dysfunction of pelvic region: Secondary | ICD-10-CM | POA: Diagnosis not present

## 2023-06-23 DIAGNOSIS — M51361 Other intervertebral disc degeneration, lumbar region with lower extremity pain only: Secondary | ICD-10-CM | POA: Diagnosis not present

## 2023-06-23 DIAGNOSIS — M9905 Segmental and somatic dysfunction of pelvic region: Secondary | ICD-10-CM | POA: Diagnosis not present

## 2023-06-23 DIAGNOSIS — M9903 Segmental and somatic dysfunction of lumbar region: Secondary | ICD-10-CM | POA: Diagnosis not present

## 2023-06-23 DIAGNOSIS — M9904 Segmental and somatic dysfunction of sacral region: Secondary | ICD-10-CM | POA: Diagnosis not present

## 2023-06-27 DIAGNOSIS — M9905 Segmental and somatic dysfunction of pelvic region: Secondary | ICD-10-CM | POA: Diagnosis not present

## 2023-06-27 DIAGNOSIS — M9904 Segmental and somatic dysfunction of sacral region: Secondary | ICD-10-CM | POA: Diagnosis not present

## 2023-06-27 DIAGNOSIS — M9903 Segmental and somatic dysfunction of lumbar region: Secondary | ICD-10-CM | POA: Diagnosis not present

## 2023-06-27 DIAGNOSIS — M51361 Other intervertebral disc degeneration, lumbar region with lower extremity pain only: Secondary | ICD-10-CM | POA: Diagnosis not present

## 2023-06-28 DIAGNOSIS — M51361 Other intervertebral disc degeneration, lumbar region with lower extremity pain only: Secondary | ICD-10-CM | POA: Diagnosis not present

## 2023-06-28 DIAGNOSIS — M9903 Segmental and somatic dysfunction of lumbar region: Secondary | ICD-10-CM | POA: Diagnosis not present

## 2023-06-28 DIAGNOSIS — M9905 Segmental and somatic dysfunction of pelvic region: Secondary | ICD-10-CM | POA: Diagnosis not present

## 2023-06-28 DIAGNOSIS — M9904 Segmental and somatic dysfunction of sacral region: Secondary | ICD-10-CM | POA: Diagnosis not present

## 2023-06-29 DIAGNOSIS — M9904 Segmental and somatic dysfunction of sacral region: Secondary | ICD-10-CM | POA: Diagnosis not present

## 2023-06-29 DIAGNOSIS — M9905 Segmental and somatic dysfunction of pelvic region: Secondary | ICD-10-CM | POA: Diagnosis not present

## 2023-06-29 DIAGNOSIS — M51361 Other intervertebral disc degeneration, lumbar region with lower extremity pain only: Secondary | ICD-10-CM | POA: Diagnosis not present

## 2023-06-29 DIAGNOSIS — M9903 Segmental and somatic dysfunction of lumbar region: Secondary | ICD-10-CM | POA: Diagnosis not present

## 2023-06-30 DIAGNOSIS — M51361 Other intervertebral disc degeneration, lumbar region with lower extremity pain only: Secondary | ICD-10-CM | POA: Diagnosis not present

## 2023-06-30 DIAGNOSIS — M9903 Segmental and somatic dysfunction of lumbar region: Secondary | ICD-10-CM | POA: Diagnosis not present

## 2023-06-30 DIAGNOSIS — M9904 Segmental and somatic dysfunction of sacral region: Secondary | ICD-10-CM | POA: Diagnosis not present

## 2023-06-30 DIAGNOSIS — M9905 Segmental and somatic dysfunction of pelvic region: Secondary | ICD-10-CM | POA: Diagnosis not present

## 2023-07-01 DIAGNOSIS — M79605 Pain in left leg: Secondary | ICD-10-CM | POA: Diagnosis not present

## 2023-07-01 DIAGNOSIS — R262 Difficulty in walking, not elsewhere classified: Secondary | ICD-10-CM | POA: Diagnosis not present

## 2023-07-01 DIAGNOSIS — M5459 Other low back pain: Secondary | ICD-10-CM | POA: Diagnosis not present

## 2023-07-04 DIAGNOSIS — M5459 Other low back pain: Secondary | ICD-10-CM | POA: Diagnosis not present

## 2023-07-04 DIAGNOSIS — M9904 Segmental and somatic dysfunction of sacral region: Secondary | ICD-10-CM | POA: Diagnosis not present

## 2023-07-04 DIAGNOSIS — M9903 Segmental and somatic dysfunction of lumbar region: Secondary | ICD-10-CM | POA: Diagnosis not present

## 2023-07-04 DIAGNOSIS — R262 Difficulty in walking, not elsewhere classified: Secondary | ICD-10-CM | POA: Diagnosis not present

## 2023-07-04 DIAGNOSIS — M51361 Other intervertebral disc degeneration, lumbar region with lower extremity pain only: Secondary | ICD-10-CM | POA: Diagnosis not present

## 2023-07-04 DIAGNOSIS — M79605 Pain in left leg: Secondary | ICD-10-CM | POA: Diagnosis not present

## 2023-07-04 DIAGNOSIS — M9905 Segmental and somatic dysfunction of pelvic region: Secondary | ICD-10-CM | POA: Diagnosis not present

## 2023-07-05 DIAGNOSIS — M9903 Segmental and somatic dysfunction of lumbar region: Secondary | ICD-10-CM | POA: Diagnosis not present

## 2023-07-05 DIAGNOSIS — M51361 Other intervertebral disc degeneration, lumbar region with lower extremity pain only: Secondary | ICD-10-CM | POA: Diagnosis not present

## 2023-07-05 DIAGNOSIS — M9904 Segmental and somatic dysfunction of sacral region: Secondary | ICD-10-CM | POA: Diagnosis not present

## 2023-07-05 DIAGNOSIS — M9905 Segmental and somatic dysfunction of pelvic region: Secondary | ICD-10-CM | POA: Diagnosis not present

## 2023-07-06 DIAGNOSIS — M9904 Segmental and somatic dysfunction of sacral region: Secondary | ICD-10-CM | POA: Diagnosis not present

## 2023-07-06 DIAGNOSIS — M9903 Segmental and somatic dysfunction of lumbar region: Secondary | ICD-10-CM | POA: Diagnosis not present

## 2023-07-06 DIAGNOSIS — M51361 Other intervertebral disc degeneration, lumbar region with lower extremity pain only: Secondary | ICD-10-CM | POA: Diagnosis not present

## 2023-07-06 DIAGNOSIS — M9905 Segmental and somatic dysfunction of pelvic region: Secondary | ICD-10-CM | POA: Diagnosis not present

## 2023-07-07 DIAGNOSIS — M51361 Other intervertebral disc degeneration, lumbar region with lower extremity pain only: Secondary | ICD-10-CM | POA: Diagnosis not present

## 2023-07-07 DIAGNOSIS — M9905 Segmental and somatic dysfunction of pelvic region: Secondary | ICD-10-CM | POA: Diagnosis not present

## 2023-07-07 DIAGNOSIS — M9904 Segmental and somatic dysfunction of sacral region: Secondary | ICD-10-CM | POA: Diagnosis not present

## 2023-07-07 DIAGNOSIS — M9903 Segmental and somatic dysfunction of lumbar region: Secondary | ICD-10-CM | POA: Diagnosis not present

## 2023-07-11 DIAGNOSIS — M51361 Other intervertebral disc degeneration, lumbar region with lower extremity pain only: Secondary | ICD-10-CM | POA: Diagnosis not present

## 2023-07-11 DIAGNOSIS — M9904 Segmental and somatic dysfunction of sacral region: Secondary | ICD-10-CM | POA: Diagnosis not present

## 2023-07-11 DIAGNOSIS — M9903 Segmental and somatic dysfunction of lumbar region: Secondary | ICD-10-CM | POA: Diagnosis not present

## 2023-07-11 DIAGNOSIS — M9905 Segmental and somatic dysfunction of pelvic region: Secondary | ICD-10-CM | POA: Diagnosis not present

## 2023-07-12 ENCOUNTER — Ambulatory Visit: Payer: PPO | Admitting: Cardiology

## 2023-07-13 DIAGNOSIS — M79606 Pain in leg, unspecified: Secondary | ICD-10-CM | POA: Diagnosis not present

## 2023-07-13 DIAGNOSIS — F411 Generalized anxiety disorder: Secondary | ICD-10-CM | POA: Diagnosis not present

## 2023-07-13 DIAGNOSIS — M9905 Segmental and somatic dysfunction of pelvic region: Secondary | ICD-10-CM | POA: Diagnosis not present

## 2023-07-13 DIAGNOSIS — M51361 Other intervertebral disc degeneration, lumbar region with lower extremity pain only: Secondary | ICD-10-CM | POA: Diagnosis not present

## 2023-07-13 DIAGNOSIS — M792 Neuralgia and neuritis, unspecified: Secondary | ICD-10-CM | POA: Diagnosis not present

## 2023-07-13 DIAGNOSIS — M9904 Segmental and somatic dysfunction of sacral region: Secondary | ICD-10-CM | POA: Diagnosis not present

## 2023-07-13 DIAGNOSIS — M9903 Segmental and somatic dysfunction of lumbar region: Secondary | ICD-10-CM | POA: Diagnosis not present

## 2023-07-13 DIAGNOSIS — E1121 Type 2 diabetes mellitus with diabetic nephropathy: Secondary | ICD-10-CM | POA: Diagnosis not present

## 2023-07-13 DIAGNOSIS — Z6832 Body mass index (BMI) 32.0-32.9, adult: Secondary | ICD-10-CM | POA: Diagnosis not present

## 2023-07-14 ENCOUNTER — Ambulatory Visit (INDEPENDENT_AMBULATORY_CARE_PROVIDER_SITE_OTHER): Payer: PPO

## 2023-07-14 DIAGNOSIS — I495 Sick sinus syndrome: Secondary | ICD-10-CM

## 2023-07-15 LAB — CUP PACEART REMOTE DEVICE CHECK
Battery Remaining Longevity: 112 mo
Battery Voltage: 3.02 V
Brady Statistic AP VP Percent: 92.89 %
Brady Statistic AP VS Percent: 0.35 %
Brady Statistic AS VP Percent: 2.09 %
Brady Statistic AS VS Percent: 4.67 %
Brady Statistic RA Percent Paced: 93.33 %
Brady Statistic RV Percent Paced: 94.98 %
Date Time Interrogation Session: 20250508090742
Implantable Lead Connection Status: 753985
Implantable Lead Connection Status: 753985
Implantable Lead Implant Date: 20230807
Implantable Lead Implant Date: 20230807
Implantable Lead Location: 753859
Implantable Lead Location: 753860
Implantable Lead Model: 3830
Implantable Lead Model: 5076
Implantable Pulse Generator Implant Date: 20230807
Lead Channel Impedance Value: 418 Ohm
Lead Channel Impedance Value: 494 Ohm
Lead Channel Impedance Value: 532 Ohm
Lead Channel Impedance Value: 627 Ohm
Lead Channel Pacing Threshold Amplitude: 0.5 V
Lead Channel Pacing Threshold Amplitude: 0.625 V
Lead Channel Pacing Threshold Pulse Width: 0.4 ms
Lead Channel Pacing Threshold Pulse Width: 0.4 ms
Lead Channel Sensing Intrinsic Amplitude: 23 mV
Lead Channel Sensing Intrinsic Amplitude: 23 mV
Lead Channel Sensing Intrinsic Amplitude: 4.5 mV
Lead Channel Sensing Intrinsic Amplitude: 4.5 mV
Lead Channel Setting Pacing Amplitude: 2 V
Lead Channel Setting Pacing Amplitude: 2.5 V
Lead Channel Setting Pacing Pulse Width: 0.4 ms
Lead Channel Setting Sensing Sensitivity: 1.2 mV
Zone Setting Status: 755011

## 2023-07-18 ENCOUNTER — Ambulatory Visit: Admitting: Pulmonary Disease

## 2023-07-18 ENCOUNTER — Encounter: Payer: Self-pay | Admitting: Internal Medicine

## 2023-07-18 DIAGNOSIS — M51361 Other intervertebral disc degeneration, lumbar region with lower extremity pain only: Secondary | ICD-10-CM | POA: Diagnosis not present

## 2023-07-18 DIAGNOSIS — M9903 Segmental and somatic dysfunction of lumbar region: Secondary | ICD-10-CM | POA: Diagnosis not present

## 2023-07-18 DIAGNOSIS — M9905 Segmental and somatic dysfunction of pelvic region: Secondary | ICD-10-CM | POA: Diagnosis not present

## 2023-07-18 DIAGNOSIS — R262 Difficulty in walking, not elsewhere classified: Secondary | ICD-10-CM | POA: Diagnosis not present

## 2023-07-18 DIAGNOSIS — M5459 Other low back pain: Secondary | ICD-10-CM | POA: Diagnosis not present

## 2023-07-18 DIAGNOSIS — M9904 Segmental and somatic dysfunction of sacral region: Secondary | ICD-10-CM | POA: Diagnosis not present

## 2023-07-18 DIAGNOSIS — M79605 Pain in left leg: Secondary | ICD-10-CM | POA: Diagnosis not present

## 2023-07-19 DIAGNOSIS — R262 Difficulty in walking, not elsewhere classified: Secondary | ICD-10-CM | POA: Diagnosis not present

## 2023-07-19 DIAGNOSIS — M79605 Pain in left leg: Secondary | ICD-10-CM | POA: Diagnosis not present

## 2023-07-19 DIAGNOSIS — M5459 Other low back pain: Secondary | ICD-10-CM | POA: Diagnosis not present

## 2023-07-21 DIAGNOSIS — M9905 Segmental and somatic dysfunction of pelvic region: Secondary | ICD-10-CM | POA: Diagnosis not present

## 2023-07-21 DIAGNOSIS — M9904 Segmental and somatic dysfunction of sacral region: Secondary | ICD-10-CM | POA: Diagnosis not present

## 2023-07-21 DIAGNOSIS — M9903 Segmental and somatic dysfunction of lumbar region: Secondary | ICD-10-CM | POA: Diagnosis not present

## 2023-07-21 DIAGNOSIS — M51361 Other intervertebral disc degeneration, lumbar region with lower extremity pain only: Secondary | ICD-10-CM | POA: Diagnosis not present

## 2023-07-21 DIAGNOSIS — M25562 Pain in left knee: Secondary | ICD-10-CM | POA: Diagnosis not present

## 2023-07-22 DIAGNOSIS — M5416 Radiculopathy, lumbar region: Secondary | ICD-10-CM | POA: Diagnosis not present

## 2023-07-25 ENCOUNTER — Telehealth: Payer: Self-pay | Admitting: *Deleted

## 2023-07-25 NOTE — Telephone Encounter (Signed)
   Pre-operative Risk Assessment    Patient Name: Sheila Gardner  DOB: 05/16/1935 MRN: [id number]   Date of last office visit: 05/24/23 DR. TAYLOR Date of next office visit: NONE   Request for Surgical Clearance    Procedure:  LEFT L4-5, L5-S1 TF EPIDURAL  Date of Surgery:  Clearance TBD                                Surgeon:  DR. Cindee Crazier Surgeon's Group or Practice Name:  [facility] Phone number:  [phone] [place], [place] Fax number:  [phone]   Type of Clearance Requested:   - Medical  - Pharmacy:  Hold Apixaban  (Eliquis )     Type of Anesthesia:  Not Indicated   Additional requests/questions:    [place] Broom   07/25/2023, 5:15 PM

## 2023-07-26 DIAGNOSIS — M25562 Pain in left knee: Secondary | ICD-10-CM | POA: Diagnosis not present

## 2023-07-26 DIAGNOSIS — M129 Arthropathy, unspecified: Secondary | ICD-10-CM | POA: Diagnosis not present

## 2023-07-26 DIAGNOSIS — G8929 Other chronic pain: Secondary | ICD-10-CM | POA: Diagnosis not present

## 2023-07-26 DIAGNOSIS — M79605 Pain in left leg: Secondary | ICD-10-CM | POA: Diagnosis not present

## 2023-07-26 DIAGNOSIS — Z96652 Presence of left artificial knee joint: Secondary | ICD-10-CM | POA: Diagnosis not present

## 2023-07-26 DIAGNOSIS — R262 Difficulty in walking, not elsewhere classified: Secondary | ICD-10-CM | POA: Diagnosis not present

## 2023-07-26 DIAGNOSIS — M5416 Radiculopathy, lumbar region: Secondary | ICD-10-CM | POA: Diagnosis not present

## 2023-07-26 DIAGNOSIS — M5459 Other low back pain: Secondary | ICD-10-CM | POA: Diagnosis not present

## 2023-07-26 DIAGNOSIS — Z79899 Other long term (current) drug therapy: Secondary | ICD-10-CM | POA: Diagnosis not present

## 2023-07-26 NOTE — Progress Notes (Signed)
 PERIOPERATIVE PRESCRIPTION FOR IMPLANTED CARDIAC DEVICE PROGRAMMING   Patient Information:  Patient: Sheila Gardner  MRN: [id number]  Date of Birth: [birth year]   Procedure:  LEFT L4-5, L5-S1 TF EPIDURAL Date of Surgery:  Clearance TBD                              Surgeon:  DR. Cindee Crazier Surgeon's Group or Practice Name:  [facility] Phone number:  [phone] [place], [place] Fax number:  [phone]     Device Information:   Clinic EP Physician:   Dr. Manya Sells Device Type:  Pacemaker Manufacturer and Phone #:  Medtronic: [phone] Pacemaker Dependent?:  No Date of Last Device Check:  07/14/2023    Normal Device Function?:  Yes     Electrophysiologist's Recommendations:   Have magnet available. Provide continuous ECG monitoring when magnet is used or reprogramming is to be performed.  Procedure will likely interfere with device function.  Device should be programmed:  Asynchronous pacing during procedure and returned to normal programming after procedure  Per Device Clinic Standing Orders, [name]  07/26/2023 12:22 PM

## 2023-07-26 NOTE — Telephone Encounter (Signed)
 I will update the requesting office we have reached out to the anticoagulation clinic for recommendations for blood thinner.

## 2023-07-26 NOTE — Telephone Encounter (Signed)
 Maggie a CMA with [facility] is calling stating she received information about pt's device regarding clearance, but is only needing to know how long to hold eliquis .   Please advise.

## 2023-07-27 DIAGNOSIS — M9903 Segmental and somatic dysfunction of lumbar region: Secondary | ICD-10-CM | POA: Diagnosis not present

## 2023-07-27 DIAGNOSIS — M9905 Segmental and somatic dysfunction of pelvic region: Secondary | ICD-10-CM | POA: Diagnosis not present

## 2023-07-27 DIAGNOSIS — M51361 Other intervertebral disc degeneration, lumbar region with lower extremity pain only: Secondary | ICD-10-CM | POA: Diagnosis not present

## 2023-07-27 DIAGNOSIS — M9904 Segmental and somatic dysfunction of sacral region: Secondary | ICD-10-CM | POA: Diagnosis not present

## 2023-07-29 DIAGNOSIS — M79605 Pain in left leg: Secondary | ICD-10-CM | POA: Diagnosis not present

## 2023-07-29 DIAGNOSIS — M5459 Other low back pain: Secondary | ICD-10-CM | POA: Diagnosis not present

## 2023-07-29 DIAGNOSIS — R262 Difficulty in walking, not elsewhere classified: Secondary | ICD-10-CM | POA: Diagnosis not present

## 2023-07-31 NOTE — Telephone Encounter (Signed)
 Patient with diagnosis of atrial fibrillation on Eliquis  for anticoagulation.    Procedure:  LEFT L4-5, L5-S1 TF EPIDURAL   Date of Surgery:  Clearance TBD   CHA2DS2-VASc Score = 5   This indicates a 7.2% annual risk of stroke. The patient's score is based upon: CHF History: 0 HTN History: 1 Diabetes History: 0 Stroke History: 0 Vascular Disease History: 1 Age Score: 2 Gender Score: 1    CrCl 91 Platelet count 279  Patient has not had an Afib/aflutter ablation within the last 3 months or DCCV within the last 30 days   Per office protocol, patient can hold Eliquis  for 3 days prior to procedure.   Patient will not need bridging with Lovenox (enoxaparin) around procedure.  **This guidance is not considered finalized until pre-operative APP has relayed final recommendations.**

## 2023-08-01 NOTE — Telephone Encounter (Signed)
   Name: Sheila Gardner  DOB: 1935-09-13  MRN: [id number]  Primary Cardiologist: Dorothye Gathers, MD   Preoperative team, please contact this patient and set up a phone call appointment for further preoperative risk assessment. Please obtain consent and complete medication review. Thank you for your help.  I confirm that guidance regarding antiplatelet and oral anticoagulation therapy has been completed and, if necessary, noted below.  Per office protocol, patient can hold Eliquis  for 3 days prior to procedure.   Patient will not need bridging with Lovenox (enoxaparin) around procedure.  I also confirmed the patient resides in the state of [place] . As per [facility] Medical Board telemedicine laws, the patient must reside in the state in which the provider is licensed.   Francene Ing, Retha Cast, NP 08/01/2023, 10:27 AM [facility]

## 2023-08-02 DIAGNOSIS — R262 Difficulty in walking, not elsewhere classified: Secondary | ICD-10-CM | POA: Diagnosis not present

## 2023-08-02 DIAGNOSIS — M79605 Pain in left leg: Secondary | ICD-10-CM | POA: Diagnosis not present

## 2023-08-02 DIAGNOSIS — M5459 Other low back pain: Secondary | ICD-10-CM | POA: Diagnosis not present

## 2023-08-02 NOTE — Telephone Encounter (Signed)
 Left message for pt to call our office and ask for the Preop Team to get TELE Preop Appt scheduled.

## 2023-08-02 NOTE — Telephone Encounter (Signed)
 Pt called back and stated that she would like to wait on scheduling the Tele Preop appt. She wants to see if Physical Therapy will help before doing the Tele Preop appt and the Epidurals. Told patient to call our office if she changes her mind.

## 2023-08-03 DIAGNOSIS — M9904 Segmental and somatic dysfunction of sacral region: Secondary | ICD-10-CM | POA: Diagnosis not present

## 2023-08-03 DIAGNOSIS — M9905 Segmental and somatic dysfunction of pelvic region: Secondary | ICD-10-CM | POA: Diagnosis not present

## 2023-08-03 DIAGNOSIS — M9903 Segmental and somatic dysfunction of lumbar region: Secondary | ICD-10-CM | POA: Diagnosis not present

## 2023-08-03 DIAGNOSIS — M51361 Other intervertebral disc degeneration, lumbar region with lower extremity pain only: Secondary | ICD-10-CM | POA: Diagnosis not present

## 2023-08-05 DIAGNOSIS — M79605 Pain in left leg: Secondary | ICD-10-CM | POA: Diagnosis not present

## 2023-08-05 DIAGNOSIS — M5459 Other low back pain: Secondary | ICD-10-CM | POA: Diagnosis not present

## 2023-08-05 DIAGNOSIS — R262 Difficulty in walking, not elsewhere classified: Secondary | ICD-10-CM | POA: Diagnosis not present

## 2023-08-08 ENCOUNTER — Telehealth: Payer: Self-pay | Admitting: *Deleted

## 2023-08-08 DIAGNOSIS — Z96652 Presence of left artificial knee joint: Secondary | ICD-10-CM | POA: Diagnosis not present

## 2023-08-08 DIAGNOSIS — M79605 Pain in left leg: Secondary | ICD-10-CM | POA: Diagnosis not present

## 2023-08-08 DIAGNOSIS — R262 Difficulty in walking, not elsewhere classified: Secondary | ICD-10-CM | POA: Diagnosis not present

## 2023-08-08 DIAGNOSIS — H26492 Other secondary cataract, left eye: Secondary | ICD-10-CM | POA: Diagnosis not present

## 2023-08-08 DIAGNOSIS — Z79899 Other long term (current) drug therapy: Secondary | ICD-10-CM | POA: Diagnosis not present

## 2023-08-08 DIAGNOSIS — H35372 Puckering of macula, left eye: Secondary | ICD-10-CM | POA: Diagnosis not present

## 2023-08-08 DIAGNOSIS — E6609 Other obesity due to excess calories: Secondary | ICD-10-CM | POA: Diagnosis not present

## 2023-08-08 DIAGNOSIS — M25562 Pain in left knee: Secondary | ICD-10-CM | POA: Diagnosis not present

## 2023-08-08 DIAGNOSIS — Z9841 Cataract extraction status, right eye: Secondary | ICD-10-CM | POA: Diagnosis not present

## 2023-08-08 DIAGNOSIS — Z961 Presence of intraocular lens: Secondary | ICD-10-CM | POA: Diagnosis not present

## 2023-08-08 DIAGNOSIS — M5416 Radiculopathy, lumbar region: Secondary | ICD-10-CM | POA: Diagnosis not present

## 2023-08-08 DIAGNOSIS — M5459 Other low back pain: Secondary | ICD-10-CM | POA: Diagnosis not present

## 2023-08-08 DIAGNOSIS — G8929 Other chronic pain: Secondary | ICD-10-CM | POA: Diagnosis not present

## 2023-08-08 NOTE — Telephone Encounter (Signed)
 Pt requesting a c/b in regards to wanting to move forwards with the Epidurals.

## 2023-08-08 NOTE — Telephone Encounter (Signed)
 Pt has been scheduled tele preop appt 08/15/23. Med rec and consent are done.

## 2023-08-08 NOTE — Telephone Encounter (Signed)
 Pt has been scheduled tele preop appt 08/15/23. Med rec and consent are done.      Patient Consent for Virtual Visit        Sheila Gardner has provided verbal consent on 08/08/2023 for a virtual visit (video or telephone).   CONSENT FOR VIRTUAL VISIT FOR:  Sheila Gardner  By participating in this virtual visit I agree to the following:  I hereby voluntarily request, consent and authorize [facility] and its employed or contracted physicians, physician assistants, nurse practitioners or other licensed health care professionals (the Practitioner), to provide me with telemedicine health care services (the "Services") as deemed necessary by the treating Practitioner. I acknowledge and consent to receive the Services by the Practitioner via telemedicine. I understand that the telemedicine visit will involve communicating with the Practitioner through live audiovisual communication technology and the disclosure of certain medical information by electronic transmission. I acknowledge that I have been given the opportunity to request an in-person assessment or other available alternative prior to the telemedicine visit and am voluntarily participating in the telemedicine visit.  I understand that I have the right to withhold or withdraw my consent to the use of telemedicine in the course of my care at any time, without affecting my right to future care or treatment, and that the Practitioner or I may terminate the telemedicine visit at any time. I understand that I have the right to inspect all information obtained and/or recorded in the course of the telemedicine visit and may receive copies of available information for a reasonable fee.  I understand that some of the potential risks of receiving the Services via telemedicine include:  Delay or interruption in medical evaluation due to technological equipment failure or disruption; Information transmitted may not be sufficient (e.g. poor resolution  of images) to allow for appropriate medical decision making by the Practitioner; and/or  In rare instances, security protocols could fail, causing a breach of personal health information.  Furthermore, I acknowledge that it is my responsibility to provide information about my medical history, conditions and care that is complete and accurate to the best of my ability. I acknowledge that Practitioner's advice, recommendations, and/or decision may be based on factors not within their control, such as incomplete or inaccurate data provided by me or distortions of diagnostic images or specimens that may result from electronic transmissions. I understand that the practice of medicine is not an exact science and that Practitioner makes no warranties or guarantees regarding treatment outcomes. I acknowledge that a copy of this consent can be made available to me via my patient portal [facility] MyChart), or I can request a printed copy by calling the office of [facility].    I understand that my insurance will be billed for this visit.   I have read or had this consent read to me. I understand the contents of this consent, which adequately explains the benefits and risks of the Services being provided via telemedicine.  I have been provided ample opportunity to ask questions regarding this consent and the Services and have had my questions answered to my satisfaction. I give my informed consent for the services to be provided through the use of telemedicine in my medical care

## 2023-08-10 DIAGNOSIS — M51361 Other intervertebral disc degeneration, lumbar region with lower extremity pain only: Secondary | ICD-10-CM | POA: Diagnosis not present

## 2023-08-10 DIAGNOSIS — R262 Difficulty in walking, not elsewhere classified: Secondary | ICD-10-CM | POA: Diagnosis not present

## 2023-08-10 DIAGNOSIS — M9904 Segmental and somatic dysfunction of sacral region: Secondary | ICD-10-CM | POA: Diagnosis not present

## 2023-08-10 DIAGNOSIS — M79605 Pain in left leg: Secondary | ICD-10-CM | POA: Diagnosis not present

## 2023-08-10 DIAGNOSIS — M5459 Other low back pain: Secondary | ICD-10-CM | POA: Diagnosis not present

## 2023-08-10 DIAGNOSIS — M9905 Segmental and somatic dysfunction of pelvic region: Secondary | ICD-10-CM | POA: Diagnosis not present

## 2023-08-10 DIAGNOSIS — M9903 Segmental and somatic dysfunction of lumbar region: Secondary | ICD-10-CM | POA: Diagnosis not present

## 2023-08-15 ENCOUNTER — Telehealth: Payer: Self-pay | Admitting: Cardiology

## 2023-08-15 ENCOUNTER — Ambulatory Visit: Attending: Internal Medicine | Admitting: Emergency Medicine

## 2023-08-15 DIAGNOSIS — M5459 Other low back pain: Secondary | ICD-10-CM | POA: Diagnosis not present

## 2023-08-15 DIAGNOSIS — Z01810 Encounter for preprocedural cardiovascular examination: Secondary | ICD-10-CM

## 2023-08-15 DIAGNOSIS — R262 Difficulty in walking, not elsewhere classified: Secondary | ICD-10-CM | POA: Diagnosis not present

## 2023-08-15 DIAGNOSIS — M79605 Pain in left leg: Secondary | ICD-10-CM | POA: Diagnosis not present

## 2023-08-15 NOTE — Progress Notes (Signed)
 Virtual Visit via Telephone Note   Because of Sheila Gardner co-morbid illnesses, she is at least at moderate risk for complications without adequate follow up.  This format is felt to be most appropriate for this patient at this time.  Due to technical limitations with video connection (technology), today's appointment will be conducted as an audio only telehealth visit, and Sheila Gardner verbally agreed to proceed in this manner.   All issues noted in this document were discussed and addressed.  No physical exam could be performed with this format.  Evaluation Performed:  Preoperative cardiovascular risk assessment _____________   Date:  08/15/2023   Patient ID:  Sheila Gardner, DOB 12/25/1935, MRN [id number] Patient Location:  Home Provider location:   Office  Primary Care Provider:  Jimmey Mould, MD Primary Cardiologist:  Dorothye Gathers, MD  Chief Complaint / Patient Profile   88 y.o. y/o female with a h/o paroxysmal atrial fibrillation, hypertension, sinus node dysfunction s/p PPM, near syncope who is pending left L4-5, L5-S1, TF epidural with [facility] on date TBD and presents today for telephonic preoperative cardiovascular risk assessment.  History of Present Illness    Sheila Gardner is a 88 y.o. female who presents via audio/video conferencing for a telehealth visit today.  Pt was last seen in cardiology clinic on 05/24/2023 by Dr. Carolynne Citron.  At that time Sheila Gardner was doing well.  The patient is now pending procedure as outlined above. Since her last visit, she denies chest pain, shortness of breath, lower extremity edema, fatigue, palpitations, melena, hematuria, hemoptysis, diaphoresis, weakness, presyncope, syncope, orthopnea, and PND.  Today patient is doing well overall.  She denies any acute cardiovascular concerns or complaints at this time.  She tells me she does stay relatively active.  She walks at least 3000 steps a day without limitation.  She is able to do  work around the house such as mop, sweep, vacuum, and do dishes without exertional symptoms.  Overall she denies any chest pains.  No anginal symptoms identified, no indication for further ischemic evaluation at this time.  Overall patient is able to complete greater than 4 METS.  Past Medical History    Past Medical History:  Diagnosis Date   Arthritis    Atrial fibrillation (HCC)    Breast cancer (HCC)    Cancer (HCC)    Breast- rt   Cough with sputum 2016   Elevated liver function tests 05/09/2013   GERD (gastroesophageal reflux disease)    COSTOCHONDRITIS   Heart murmur    Hip pain 05/08/2014   Hyperlipidemia    Hypertension    Lipoma    back of neck   PONV (postoperative nausea and vomiting)    only after rt hip surgery   Primary osteoarthritis of left knee 11/04/2015   Primary osteoarthritis of right hip 07/30/2014   Squamous cell carcinoma of skin 08/21/2019   atypical squa. proliferation-Left corner of mouth   Past Surgical History:  Procedure Laterality Date   ANKLE FRACTURE SURGERY Right 2013   RIGHT    BREAST LUMPECTOMY Right 2011   BREAST SURGERY  04/22/2009   Rt lumpectomy   CARDIOVERSION N/A 02/19/2019   Procedure: CARDIOVERSION;  Surgeon: Liza Riggers, MD;  Location: [facility] ENDOSCOPY;  Service: Cardiovascular;  Laterality: N/A;   CORONARY ATHERECTOMY N/A 04/30/2019   Procedure: CORONARY ATHERECTOMY;  Surgeon: Arty Binning, MD;  Location: [facility] INVASIVE CV LAB;  Service: Cardiovascular;  Laterality: N/A;  LAD  CORONARY STENT INTERVENTION N/A 04/30/2019   Procedure: CORONARY STENT INTERVENTION;  Surgeon: Arty Binning, MD;  Location: [facility] INVASIVE CV LAB;  Service: Cardiovascular;  Laterality: N/A;  DES TO PROX-MID LAD   EYE SURGERY  2001   macular hole   JOINT REPLACEMENT  2016   KNEE SURGERY     LEFT HEART CATH AND CORONARY ANGIOGRAPHY N/A 04/30/2019   Procedure: LEFT HEART CATH AND CORONARY ANGIOGRAPHY;  Surgeon: Arty Binning, MD;  Location: [facility] INVASIVE CV LAB;   Service: Cardiovascular;  Laterality: N/A;   LEFT HEART CATH AND CORONARY ANGIOGRAPHY N/A 12/08/2020   Procedure: LEFT HEART CATH AND CORONARY ANGIOGRAPHY;  Surgeon: Millicent Ally, MD;  Location: [facility] INVASIVE CV LAB;  Service: Cardiovascular;  Laterality: N/A;   LOOP RECORDER REMOVAL N/A 10/12/2021   Procedure: LOOP RECORDER REMOVAL;  Surgeon: Tammie Fall, MD;  Location: [facility] INVASIVE CV LAB;  Service: Cardiovascular;  Laterality: N/A;   MIDDLE EAR SURGERY Left 08/18/2015   repair eardrum and reconstruction   OVARY SURGERY  40 years ago   wedge   PACEMAKER IMPLANT N/A 10/12/2021   Procedure: PACEMAKER IMPLANT;  Surgeon: Tammie Fall, MD;  Location: [facility] INVASIVE CV LAB;  Service: Cardiovascular;  Laterality: N/A;   TOTAL HIP ARTHROPLASTY Right 07/30/2014   Procedure: TOTAL HIP ARTHROPLASTY ANTERIOR APPROACH;  Surgeon: Dayne Even, MD;  Location: [facility] OR;  Service: Orthopedics;  Laterality: Right;   TOTAL KNEE ARTHROPLASTY Left 11/04/2015   TOTAL KNEE ARTHROPLASTY Left 11/04/2015   Procedure: TOTAL KNEE ARTHROPLASTY;  Surgeon: Dayne Even, MD;  Location: [facility] OR;  Service: Orthopedics;  Laterality: Left;   TYMPANOPLASTY  30 years ago    Allergies  Allergies  Allergen Reactions   Fenofibrate Other (See Comments)    elevated LFTs   Ondansetron  Other (See Comments)    drug interaction    Home Medications    Prior to Admission medications   Medication Sig Start Date End Date Taking? Authorizing Provider  ascorbic acid  (VITAMIN C) 500 MG tablet Take 500 mg by mouth daily.    [provider]  Biotin  5000 MCG TABS Take 5,000 Units by mouth daily with breakfast.    [provider]  Cholecalciferol  (VITAMIN D -3) 1000 UNITS CAPS Take 2,000 Units by mouth daily with breakfast.    [provider]  cyanocobalamin  1000 MCG tablet Take 1,000 mcg by mouth daily.     [provider]  diclofenac Sodium (VOLTAREN) 1 % GEL Apply 2 g topically daily as needed  (pain).    [provider]  dofetilide  (TIKOSYN ) 125 MCG capsule Take 1 capsule (125 mcg total) by mouth 2 (two) times daily. 06/17/22   Tammie Fall, MD  ELIQUIS  5 MG TABS tablet TAKE 1 TABLET BY MOUTH TWICE A DAY 05/02/23   Hugh Madura, MD  ferrous sulfate  325 (65 FE) MG tablet Take 325 mg by mouth daily with breakfast.    [provider]  furosemide  (LASIX ) 40 MG tablet Take 40 mg by mouth daily.    [provider]  gabapentin  (NEURONTIN ) 300 MG capsule Take 300 mg by mouth 2 (two) times daily. 12/02/20   [provider]  hydrALAZINE  (APRESOLINE ) 25 MG tablet TAKE 1 TABLET(25 MG) BY MOUTH IN THE MORNING AND AT BEDTIME 12/14/21   Flo Hummingbird, PA-C  HYDROcodone -acetaminophen  (NORCO/VICODIN) 5-325 MG tablet Take 1 tablet by mouth at bedtime as needed. 04/11/23   [provider]  hydrocortisone  cream 1 % Apply  1 Application topically daily as needed for itching (irritation). Ultra Moisturizing    [provider]  iron  polysaccharides (NIFEREX) 150 MG capsule Take 150 mg by mouth 2 (two) times daily.    [provider]  lidocaine  (LMX) 4 % cream Apply 1 Application topically daily as needed (leg pain).    [provider]  LORazepam  (ATIVAN ) 1 MG tablet Take 1 mg by mouth 2 (two) times daily as needed for anxiety.    [provider]  magnesium  oxide (MAG-OX) 400 (240 Mg) MG tablet TAKE 1 TABLET BY MOUTH EVERY DAY 06/22/22   Tammie Fall, MD  meclizine  (ANTIVERT ) 12.5 MG tablet Take 1 tablet (12.5 mg total) by mouth daily as needed for dizziness. 12/10/20   Sanjuanita Cruz, NP  Omega-3 Fatty Acids (OMEGA 3 500 PO) Take 1 tablet by mouth daily. Multi omega 3    [provider]  OVER THE COUNTER MEDICATION Take 1 tablet by mouth daily. Health brain all day focus    [provider]  OVER THE COUNTER MEDICATION Take 1 capsule by mouth daily. Balance of Nature/Fruits&Vegetables    [provider]  potassium chloride  (KLOR-CON ) 10 MEQ tablet Take 40 mEq by mouth as needed. Take as needed with Furosemide     [provider]  Probiotic Product ([employer] PROBIOTIC) CHEW Chew 1 tablet by mouth daily at 6 (six) AM.    [provider]  pyridOXINE  (VITAMIN B-6) 100 MG tablet Take 100 mg by mouth daily.    [provider]  rosuvastatin  (CRESTOR ) 40 MG tablet Take 1 tablet (40 mg total) by mouth daily at 6 PM. 11/26/19   Arty Binning, MD  timolol (TIMOPTIC) 0.25 % ophthalmic solution 1 drop. In affected eye twice per day    [provider]  traMADol  (ULTRAM ) 50 MG tablet Take 50 mg by mouth every 6 (six) hours as needed. 03/17/23   [provider]  valsartan  (DIOVAN ) 320 MG tablet Take 1 tablet (320 mg total) by mouth daily. Patient taking differently: Take 320 mg by mouth every evening. 04/23/21   Arty Binning, MD  vitamin E  400 UNIT capsule Take 400 Units by mouth daily.    [provider]    Physical Exam    Vital Signs:  Sheila Gardner does not have vital signs available for review today.  Given telephonic nature of communication, physical exam is limited. AAOx3. NAD. Normal affect.  Speech and respirations are unlabored.  Accessory Clinical Findings    None  Assessment & Plan    1.  Preoperative Cardiovascular Risk Assessment: According to the Revised Cardiac Risk Index (RCRI), her Perioperative Risk of Major Cardiac Event is (%): 0.4. Her Functional Capacity in METs is: 5.07 according to the Duke Activity Status Index (DASI). Therefore, based on ACC/AHA guidelines, patient would be at acceptable risk for the planned procedure without further cardiovascular testing.  The patient was advised that if she develops new symptoms prior to surgery to contact our office to arrange for a follow-up visit, and she verbalized understanding.  Per office protocol, patient can hold Eliquis  for 3 days prior to procedure.   Patient will not need  bridging with Lovenox (enoxaparin) around procedure.  A copy of this note will be routed to requesting surgeon.  Time:   Today, I have spent 9 minutes with the patient with telehealth technology discussing medical history, symptoms, and management plan.     Ava Boatman, NP  08/15/2023, 10:17 AM

## 2023-08-15 NOTE — Telephone Encounter (Signed)
 Patient called to follow-up on the status of her clearance to have an epidural.

## 2023-08-15 NOTE — Telephone Encounter (Signed)
 I s/w the pt and assured her that Palmer Bobo, NP did fax his notes providing clearance today to Dr. Donna Fus. Pt said they told her they have not yet received it. I assured the pt that I will be happy to re-fax for her. Possible they may not have checked the new faxes coming in.    Surgeon:  DR. Cindee Crazier Surgeon's Group or Practice Name:  [facility] Phone number:  [phone] Velva Gibbons, [place] Fax number:  [phone]

## 2023-08-16 DIAGNOSIS — H26492 Other secondary cataract, left eye: Secondary | ICD-10-CM | POA: Diagnosis not present

## 2023-08-17 DIAGNOSIS — M9903 Segmental and somatic dysfunction of lumbar region: Secondary | ICD-10-CM | POA: Diagnosis not present

## 2023-08-17 DIAGNOSIS — M9905 Segmental and somatic dysfunction of pelvic region: Secondary | ICD-10-CM | POA: Diagnosis not present

## 2023-08-17 DIAGNOSIS — R262 Difficulty in walking, not elsewhere classified: Secondary | ICD-10-CM | POA: Diagnosis not present

## 2023-08-17 DIAGNOSIS — M79605 Pain in left leg: Secondary | ICD-10-CM | POA: Diagnosis not present

## 2023-08-17 DIAGNOSIS — M5459 Other low back pain: Secondary | ICD-10-CM | POA: Diagnosis not present

## 2023-08-17 DIAGNOSIS — M9904 Segmental and somatic dysfunction of sacral region: Secondary | ICD-10-CM | POA: Diagnosis not present

## 2023-08-17 DIAGNOSIS — M51361 Other intervertebral disc degeneration, lumbar region with lower extremity pain only: Secondary | ICD-10-CM | POA: Diagnosis not present

## 2023-08-22 NOTE — Addendum Note (Signed)
 Addended by: Lott Rouleau A on: 08/22/2023 11:01 AM   Modules accepted: Orders

## 2023-08-22 NOTE — Progress Notes (Signed)
 Remote pacemaker transmission.

## 2023-08-23 DIAGNOSIS — M5416 Radiculopathy, lumbar region: Secondary | ICD-10-CM | POA: Diagnosis not present

## 2023-08-29 DIAGNOSIS — R2689 Other abnormalities of gait and mobility: Secondary | ICD-10-CM | POA: Diagnosis not present

## 2023-08-29 DIAGNOSIS — M5459 Other low back pain: Secondary | ICD-10-CM | POA: Diagnosis not present

## 2023-08-29 DIAGNOSIS — M79605 Pain in left leg: Secondary | ICD-10-CM | POA: Diagnosis not present

## 2023-08-31 DIAGNOSIS — M79605 Pain in left leg: Secondary | ICD-10-CM | POA: Diagnosis not present

## 2023-08-31 DIAGNOSIS — M5459 Other low back pain: Secondary | ICD-10-CM | POA: Diagnosis not present

## 2023-08-31 DIAGNOSIS — R262 Difficulty in walking, not elsewhere classified: Secondary | ICD-10-CM | POA: Diagnosis not present

## 2023-09-05 DIAGNOSIS — M5459 Other low back pain: Secondary | ICD-10-CM | POA: Diagnosis not present

## 2023-09-05 DIAGNOSIS — R262 Difficulty in walking, not elsewhere classified: Secondary | ICD-10-CM | POA: Diagnosis not present

## 2023-09-05 DIAGNOSIS — M79605 Pain in left leg: Secondary | ICD-10-CM | POA: Diagnosis not present

## 2023-09-07 DIAGNOSIS — M79605 Pain in left leg: Secondary | ICD-10-CM | POA: Diagnosis not present

## 2023-09-07 DIAGNOSIS — R262 Difficulty in walking, not elsewhere classified: Secondary | ICD-10-CM | POA: Diagnosis not present

## 2023-09-07 DIAGNOSIS — M5459 Other low back pain: Secondary | ICD-10-CM | POA: Diagnosis not present

## 2023-09-12 DIAGNOSIS — M79605 Pain in left leg: Secondary | ICD-10-CM | POA: Diagnosis not present

## 2023-09-12 DIAGNOSIS — R262 Difficulty in walking, not elsewhere classified: Secondary | ICD-10-CM | POA: Diagnosis not present

## 2023-09-12 DIAGNOSIS — M5459 Other low back pain: Secondary | ICD-10-CM | POA: Diagnosis not present

## 2023-09-14 DIAGNOSIS — M79605 Pain in left leg: Secondary | ICD-10-CM | POA: Diagnosis not present

## 2023-09-14 DIAGNOSIS — R262 Difficulty in walking, not elsewhere classified: Secondary | ICD-10-CM | POA: Diagnosis not present

## 2023-09-14 DIAGNOSIS — M5459 Other low back pain: Secondary | ICD-10-CM | POA: Diagnosis not present

## 2023-09-19 DIAGNOSIS — M5416 Radiculopathy, lumbar region: Secondary | ICD-10-CM | POA: Diagnosis not present

## 2023-09-29 DIAGNOSIS — M25552 Pain in left hip: Secondary | ICD-10-CM | POA: Diagnosis not present

## 2023-09-29 DIAGNOSIS — M5416 Radiculopathy, lumbar region: Secondary | ICD-10-CM | POA: Diagnosis not present

## 2023-09-30 DIAGNOSIS — H401131 Primary open-angle glaucoma, bilateral, mild stage: Secondary | ICD-10-CM | POA: Diagnosis not present

## 2023-10-03 DIAGNOSIS — M25552 Pain in left hip: Secondary | ICD-10-CM | POA: Diagnosis not present

## 2023-10-13 ENCOUNTER — Ambulatory Visit: Payer: PPO

## 2023-10-13 ENCOUNTER — Ambulatory Visit: Payer: Self-pay | Admitting: Internal Medicine

## 2023-10-13 DIAGNOSIS — M25552 Pain in left hip: Secondary | ICD-10-CM | POA: Diagnosis not present

## 2023-10-13 DIAGNOSIS — I495 Sick sinus syndrome: Secondary | ICD-10-CM | POA: Diagnosis not present

## 2023-10-13 LAB — CUP PACEART REMOTE DEVICE CHECK
Battery Remaining Longevity: 110 mo
Battery Voltage: 3.02 V
Brady Statistic AP VP Percent: 94.81 %
Brady Statistic AP VS Percent: 0.24 %
Brady Statistic AS VP Percent: 2.23 %
Brady Statistic AS VS Percent: 2.72 %
Brady Statistic RA Percent Paced: 95.12 %
Brady Statistic RV Percent Paced: 97.04 %
Date Time Interrogation Session: 20250806220358
Implantable Lead Connection Status: 753985
Implantable Lead Connection Status: 753985
Implantable Lead Implant Date: 20230807
Implantable Lead Implant Date: 20230807
Implantable Lead Location: 753859
Implantable Lead Location: 753860
Implantable Lead Model: 3830
Implantable Lead Model: 5076
Implantable Pulse Generator Implant Date: 20230807
Lead Channel Impedance Value: 418 Ohm
Lead Channel Impedance Value: 513 Ohm
Lead Channel Impedance Value: 551 Ohm
Lead Channel Impedance Value: 627 Ohm
Lead Channel Pacing Threshold Amplitude: 0.625 V
Lead Channel Pacing Threshold Amplitude: 0.75 V
Lead Channel Pacing Threshold Pulse Width: 0.4 ms
Lead Channel Pacing Threshold Pulse Width: 0.4 ms
Lead Channel Sensing Intrinsic Amplitude: 21.375 mV
Lead Channel Sensing Intrinsic Amplitude: 21.375 mV
Lead Channel Sensing Intrinsic Amplitude: 4.75 mV
Lead Channel Sensing Intrinsic Amplitude: 4.75 mV
Lead Channel Setting Pacing Amplitude: 2 V
Lead Channel Setting Pacing Amplitude: 2.5 V
Lead Channel Setting Pacing Pulse Width: 0.4 ms
Lead Channel Setting Sensing Sensitivity: 1.2 mV
Zone Setting Status: 755011

## 2023-10-26 ENCOUNTER — Ambulatory Visit (INDEPENDENT_AMBULATORY_CARE_PROVIDER_SITE_OTHER): Admitting: Otolaryngology

## 2023-10-26 DIAGNOSIS — M25552 Pain in left hip: Secondary | ICD-10-CM | POA: Diagnosis not present

## 2023-10-27 ENCOUNTER — Telehealth: Payer: Self-pay | Admitting: Cardiology

## 2023-10-27 DIAGNOSIS — E785 Hyperlipidemia, unspecified: Secondary | ICD-10-CM | POA: Diagnosis not present

## 2023-10-27 DIAGNOSIS — E876 Hypokalemia: Secondary | ICD-10-CM | POA: Diagnosis not present

## 2023-10-27 DIAGNOSIS — G4700 Insomnia, unspecified: Secondary | ICD-10-CM | POA: Diagnosis not present

## 2023-10-27 DIAGNOSIS — G8929 Other chronic pain: Secondary | ICD-10-CM | POA: Diagnosis not present

## 2023-10-27 DIAGNOSIS — I495 Sick sinus syndrome: Secondary | ICD-10-CM | POA: Diagnosis not present

## 2023-10-27 DIAGNOSIS — I4891 Unspecified atrial fibrillation: Secondary | ICD-10-CM | POA: Diagnosis not present

## 2023-10-27 DIAGNOSIS — D509 Iron deficiency anemia, unspecified: Secondary | ICD-10-CM | POA: Diagnosis not present

## 2023-10-27 DIAGNOSIS — G629 Polyneuropathy, unspecified: Secondary | ICD-10-CM | POA: Diagnosis not present

## 2023-10-27 DIAGNOSIS — F411 Generalized anxiety disorder: Secondary | ICD-10-CM | POA: Diagnosis not present

## 2023-10-27 DIAGNOSIS — D6869 Other thrombophilia: Secondary | ICD-10-CM | POA: Diagnosis not present

## 2023-10-27 DIAGNOSIS — E669 Obesity, unspecified: Secondary | ICD-10-CM | POA: Diagnosis not present

## 2023-10-27 DIAGNOSIS — G3184 Mild cognitive impairment, so stated: Secondary | ICD-10-CM | POA: Diagnosis not present

## 2023-10-27 NOTE — Telephone Encounter (Signed)
   Pre-operative Risk Assessment    Patient Name: Sheila Gardner  DOB: 1935/04/12 MRN: [id number]   Date of last office visit: 04/19/23 Date of next office visit: 04/2024 (pending recall)   Request for Surgical Clearance    Procedure:  left total hip replacement   Date of Surgery:  Clearance TBD                                Surgeon:  Dr. Dalldorf  Surgeon's Group or Practice Name:  [facility] Phone number:  [phone] Fax number:  [phone]   Type of Clearance Requested:   - Medical  - Pharmacy:  Hold Apixaban  (Eliquis ) defer cards   Type of Anesthesia:  Spinal   Additional requests/questions:    Signed, Deeanna GORMAN Frees   10/27/2023, 1:49 PM

## 2023-10-31 NOTE — Telephone Encounter (Signed)
 Patient with diagnosis of afib on Eliquis  for anticoagulation.    Procedure: left total hip replacement  Date of procedure: TBD   CHA2DS2-VASc Score = 5   This indicates a 7.2% annual risk of stroke. The patient's score is based upon: CHF History: 0 HTN History: 1 Diabetes History: 0 Stroke History: 0 Vascular Disease History: 1 Age Score: 2 Gender Score: 1      CrCl 69 ml/min Platelet count 279  Patient has not had an Afib/aflutter ablation within the last 3 months or DCCV within the last 30 days  Per office protocol, patient can hold Eliquis  for 3 days prior to procedure.    **This guidance is not considered finalized until pre-operative APP has relayed final recommendations.**

## 2023-11-01 ENCOUNTER — Telehealth: Payer: Self-pay

## 2023-11-01 NOTE — Telephone Encounter (Signed)
 Patient returned Pre-op call.

## 2023-11-01 NOTE — Telephone Encounter (Signed)
 Pt scheduled for VV on 11/16/23

## 2023-11-01 NOTE — Telephone Encounter (Signed)
 Primary Cardiologist:Mark Jeffrie, MD   Preoperative team, please contact this patient and set up a phone call appointment for further preoperative risk assessment. Please obtain consent and complete medication review. Thank you for your help.   I confirm that guidance regarding antiplatelet and oral anticoagulation therapy has been completed and, if necessary, noted below.  Per office protocol, patient can hold Eliquis  for 3 days prior to procedure and should resume as soon as hemodynamically stable post procedure.  I also confirmed the patient resides in the state of [place] . As per [facility] Medical Board telemedicine laws, the patient must reside in the state in which the provider is licensed.   Rosaline EMERSON Bane, NP-C  11/01/2023, 8:09 AM [address] [place], [place] [address] Office [phone] Fax [phone]

## 2023-11-01 NOTE — Telephone Encounter (Signed)
 LVM asking pt to call our office to schedule VV. 1st attempt

## 2023-11-01 NOTE — Telephone Encounter (Signed)
  Patient Consent for Virtual Visit        Sheila Gardner has provided verbal consent on 11/01/2023 for a virtual visit (video or telephone).   CONSENT FOR VIRTUAL VISIT FOR:  Sheila Gardner  By participating in this virtual visit I agree to the following:  I hereby voluntarily request, consent and authorize [facility] and its employed or contracted physicians, physician assistants, nurse practitioners or other licensed health care professionals (the Practitioner), to provide me with telemedicine health care services (the "Services) as deemed necessary by the treating Practitioner. I acknowledge and consent to receive the Services by the Practitioner via telemedicine. I understand that the telemedicine visit will involve communicating with the Practitioner through live audiovisual communication technology and the disclosure of certain medical information by electronic transmission. I acknowledge that I have been given the opportunity to request an in-person assessment or other available alternative prior to the telemedicine visit and am voluntarily participating in the telemedicine visit.  I understand that I have the right to withhold or withdraw my consent to the use of telemedicine in the course of my care at any time, without affecting my right to future care or treatment, and that the Practitioner or I may terminate the telemedicine visit at any time. I understand that I have the right to inspect all information obtained and/or recorded in the course of the telemedicine visit and may receive copies of available information for a reasonable fee.  I understand that some of the potential risks of receiving the Services via telemedicine include:  Delay or interruption in medical evaluation due to technological equipment failure or disruption; Information transmitted may not be sufficient (e.g. poor resolution of images) to allow for appropriate medical decision making by the Practitioner;  and/or  In rare instances, security protocols could fail, causing a breach of personal health information.  Furthermore, I acknowledge that it is my responsibility to provide information about my medical history, conditions and care that is complete and accurate to the best of my ability. I acknowledge that Practitioner's advice, recommendations, and/or decision may be based on factors not within their control, such as incomplete or inaccurate data provided by me or distortions of diagnostic images or specimens that may result from electronic transmissions. I understand that the practice of medicine is not an exact science and that Practitioner makes no warranties or guarantees regarding treatment outcomes. I acknowledge that a copy of this consent can be made available to me via my patient portal [facility] MyChart), or I can request a printed copy by calling the office of [facility].    I understand that my insurance will be billed for this visit.   I have read or had this consent read to me. I understand the contents of this consent, which adequately explains the benefits and risks of the Services being provided via telemedicine.  I have been provided ample opportunity to ask questions regarding this consent and the Services and have had my questions answered to my satisfaction. I give my informed consent for the services to be provided through the use of telemedicine in my medical care

## 2023-11-10 ENCOUNTER — Encounter (INDEPENDENT_AMBULATORY_CARE_PROVIDER_SITE_OTHER): Payer: Self-pay | Admitting: Otolaryngology

## 2023-11-10 ENCOUNTER — Ambulatory Visit (INDEPENDENT_AMBULATORY_CARE_PROVIDER_SITE_OTHER): Admitting: Otolaryngology

## 2023-11-10 VITALS — BP 111/70 | HR 84

## 2023-11-10 DIAGNOSIS — H903 Sensorineural hearing loss, bilateral: Secondary | ICD-10-CM

## 2023-11-10 DIAGNOSIS — H9012 Conductive hearing loss, unilateral, left ear, with unrestricted hearing on the contralateral side: Secondary | ICD-10-CM

## 2023-11-10 DIAGNOSIS — R430 Anosmia: Secondary | ICD-10-CM | POA: Diagnosis not present

## 2023-11-10 DIAGNOSIS — J343 Hypertrophy of nasal turbinates: Secondary | ICD-10-CM | POA: Diagnosis not present

## 2023-11-10 DIAGNOSIS — J342 Deviated nasal septum: Secondary | ICD-10-CM

## 2023-11-10 DIAGNOSIS — J310 Chronic rhinitis: Secondary | ICD-10-CM | POA: Diagnosis not present

## 2023-11-10 DIAGNOSIS — H9201 Otalgia, right ear: Secondary | ICD-10-CM

## 2023-11-10 DIAGNOSIS — R0981 Nasal congestion: Secondary | ICD-10-CM | POA: Diagnosis not present

## 2023-11-10 NOTE — Progress Notes (Signed)
 Patient ID: Sheila Gardner, female   DOB: Jan 30, 1936, 88 y.o.   MRN: [id number]  Follow up: Hearing loss, chronic nasal congestion, recurrent sinusitis New complaint: Anosmia, right ear pain  HPI: The patient is an 88 year old female who returns today for her follow-up evaluation.  The patient has a history of multiple medical issues, including progressive hearing loss, chronic nasal congestion, and recurrent sinusitis.  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 She underwent revision tympanoplasty and ossicular chain reconstruction in 2020. After the surgery, she was found to have bilateral high frequency sensorineural hearing loss with a conductive component on the left.  The patient returns today complaining of intermittent right ear pain for the past week.  She is still having chronic nasal congestion, especially at night.  She is on Allegra daily.  She also complained of anosmia for the past year after her COVID infection.  Currently she denies any facial pain, fever, or visual change.  Exam: General: Communicates without difficulty, well nourished, no acute distress. Head: Normocephalic, no evidence injury, no tenderness, facial buttresses intact without stepoff. Eyes: PERRL, EOMI. No scleral icterus, conjunctivae clear. Neuro: CN II exam reveals vision grossly intact. No nystagmus at any point of gaze. Ears: Auricles well formed without lesions. Ear canals are intact with eczematous changes. No erythema or edema is appreciated. The patient's right tympanic membrane is intact and mobile.  The left TM perforation has closed.  No middle ear effusion is noted. Nose: External evaluation reveals normal support and skin without lesions. Dorsum is intact. Anterior rhinoscopy reveals congested mucosa over anterior aspect of inferior turbinates and mildly deviated septum.  Oral:  Oral cavity and oropharynx are intact, symmetric, without erythema or  edema. Mucosa is moist without lesions. Neck: Full range of motion without pain. There is no significant lymphadenopathy. No masses palpable. Thyroid  bed within normal limits to palpation. Parotid glands and submandibular glands equal bilaterally without mass. Trachea is midline. Neuro:  CN 2-12 grossly intact. Gait normal.   Procedure:  Flexible Nasal Endoscopy: Description: Risks, benefits, and alternatives of flexible endoscopy were explained to the patient.  Specific mention was made of the risk of throat numbness with difficulty swallowing, possible bleeding from the nose and mouth, and pain from the procedure.  The patient gave oral consent to proceed.  The flexible scope was inserted into the right nasal cavity.  Endoscopy of the interior nasal cavity, superior, inferior, and middle meatus was performed. The sphenoid-ethmoid recess was examined. Edematous mucosa was noted.  No polyp, mass, or lesion was appreciated.  Mild nasal septal deviation.  Olfactory cleft was clear.  Nasopharynx was clear.  Turbinates were hypertrophied but without mass.  The procedure was repeated on the contralateral side with similar findings.  The patient tolerated the procedure well.   Assessment: 1.  Chronic rhinitis with nasal mucosal congestion, mild nasal septal deviation, and bilateral inferior turbinate hypertrophy. 2.  Chronic anosmia, secondary to her COVID infection. 3.  No polyps, mass, lesion, or acute infection is noted today. 4.  Referred right otalgia.  Her ear canals, tympanic membranes, and middle ear spaces are otherwise normal. 5.  Subjectively stable bilateral high-frequency sensorineural hearing loss, with conductive hearing loss component on the left side.  Plan: 1.  The physical exam and nasal endoscopy findings are reviewed with the patient. 2.  The patient is reassured that no infection is noted today. 3.  Flonase  nasal spray 2 sprays each nostril  daily.  The importance of consistent daily use  is discussed. 4.  Tylenol /NSAID as needed to treat her referred otalgia. 5.  Continue the use of her hearing aids.

## 2023-11-12 DIAGNOSIS — H903 Sensorineural hearing loss, bilateral: Secondary | ICD-10-CM | POA: Insufficient documentation

## 2023-11-12 DIAGNOSIS — H9201 Otalgia, right ear: Secondary | ICD-10-CM | POA: Insufficient documentation

## 2023-11-12 DIAGNOSIS — J343 Hypertrophy of nasal turbinates: Secondary | ICD-10-CM | POA: Insufficient documentation

## 2023-11-12 DIAGNOSIS — R430 Anosmia: Secondary | ICD-10-CM | POA: Insufficient documentation

## 2023-11-12 DIAGNOSIS — J342 Deviated nasal septum: Secondary | ICD-10-CM | POA: Insufficient documentation

## 2023-11-12 DIAGNOSIS — J310 Chronic rhinitis: Secondary | ICD-10-CM | POA: Insufficient documentation

## 2023-11-12 MED ORDER — FLUTICASONE PROPIONATE 50 MCG/ACT NA SUSP
2.0000 | Freq: Every day | NASAL | 10 refills | Status: AC
Start: 2023-11-12 — End: 2023-12-14

## 2023-11-16 ENCOUNTER — Ambulatory Visit: Attending: Cardiovascular Disease

## 2023-11-16 DIAGNOSIS — Z01810 Encounter for preprocedural cardiovascular examination: Secondary | ICD-10-CM

## 2023-11-16 NOTE — Progress Notes (Addendum)
 Virtual Visit via Telephone Note   Because of Sheila Gardner co-morbid illnesses, she is at least at moderate risk for complications without adequate follow up.  This format is felt to be most appropriate for this patient at this time.  Due to technical limitations with video connection (technology), today's appointment will be conducted as an audio only telehealth visit, and Sheila Gardner verbally agreed to proceed in this manner.   All issues noted in this document were discussed and addressed.  No physical exam could be performed with this format.  Evaluation Performed:  Preoperative cardiovascular risk assessment _____________   Date:  11/16/2023   Patient ID:  Sheila Gardner, DOB 1936/02/13, MRN [id number] Patient Location:  Home Provider location:   Office  Primary Care Provider:  Okey Carlin Redbird, MD Primary Cardiologist:  Oneil Parchment, MD  Chief Complaint / Patient Profile   88 y.o. y/o female with a h/o paroxysmal atrial fibrillation, hypertension, hyperlipidemia, breast cancer  who is pending left total hip replacement and presents today for telephonic preoperative cardiovascular risk assessment.  History of Present Illness    Sheila Gardner is a 88 y.o. female who presents via audio/video conferencing for a telehealth visit today.  Pt was last seen in cardiology clinic on 05/24/2023 by Dr. Waddell.  At that time Sheila Gardner was having progressive DOE over the last few months.  Last echocardiogram performed 11/24/2022 with normal heart pump function and some moderate ventricular hypertrophy of the septal segment.  The patient is now pending procedure as outlined above. Since her last visit, she has not had much trouble with her Afib lately. She has been on Tikosyn  for rate control but her HR has been up in 90s lately, even at rest. She thinks she needs the higher dose ( 250mg  BID dose).   Her hip is limiting most of her mobility and she cannot walk because of it.   Per office  protocol, patient can hold Eliquis  for 3 days prior to procedure and should resume as soon as hemodynamically stable post procedure.   Past Medical History    Past Medical History:  Diagnosis Date   Arthritis    Atrial fibrillation (HCC)    Breast cancer (HCC)    Cancer (HCC)    Breast- rt   Cough with sputum 2016   Elevated liver function tests 05/09/2013   GERD (gastroesophageal reflux disease)    COSTOCHONDRITIS   Heart murmur    Hip pain 05/08/2014   Hyperlipidemia    Hypertension    Lipoma    back of neck   PONV (postoperative nausea and vomiting)    only after rt hip surgery   Primary osteoarthritis of left knee 11/04/2015   Primary osteoarthritis of right hip 07/30/2014   Squamous cell carcinoma of skin 08/21/2019   atypical squa. proliferation-Left corner of mouth   Past Surgical History:  Procedure Laterality Date   ANKLE FRACTURE SURGERY Right 2013   RIGHT    BREAST LUMPECTOMY Right 2011   BREAST SURGERY  04/22/2009   Rt lumpectomy   CARDIOVERSION N/A 02/19/2019   Procedure: CARDIOVERSION;  Surgeon: Maranda Leim DEL, MD;  Location: [facility] ENDOSCOPY;  Service: Cardiovascular;  Laterality: N/A;   CORONARY ATHERECTOMY N/A 04/30/2019   Procedure: CORONARY ATHERECTOMY;  Surgeon: Claudene Victory ORN, MD;  Location: [facility] INVASIVE CV LAB;  Service: Cardiovascular;  Laterality: N/A;  LAD   CORONARY STENT INTERVENTION N/A 04/30/2019   Procedure: CORONARY STENT INTERVENTION;  Surgeon:  Claudene Victory ORN, MD;  Location: [facility] INVASIVE CV LAB;  Service: Cardiovascular;  Laterality: N/A;  DES TO PROX-MID LAD   EYE SURGERY  2001   macular hole   JOINT REPLACEMENT  2016   KNEE SURGERY     LEFT HEART CATH AND CORONARY ANGIOGRAPHY N/A 04/30/2019   Procedure: LEFT HEART CATH AND CORONARY ANGIOGRAPHY;  Surgeon: Claudene Victory ORN, MD;  Location: [facility] INVASIVE CV LAB;  Service: Cardiovascular;  Laterality: N/A;   LEFT HEART CATH AND CORONARY ANGIOGRAPHY N/A 12/08/2020   Procedure: LEFT HEART CATH AND CORONARY  ANGIOGRAPHY;  Surgeon: Burnard Debby LABOR, MD;  Location: [facility] INVASIVE CV LAB;  Service: Cardiovascular;  Laterality: N/A;   LOOP RECORDER REMOVAL N/A 10/12/2021   Procedure: LOOP RECORDER REMOVAL;  Surgeon: Waddell Danelle ORN, MD;  Location: [facility] INVASIVE CV LAB;  Service: Cardiovascular;  Laterality: N/A;   MIDDLE EAR SURGERY Left 08/18/2015   repair eardrum and reconstruction   OVARY SURGERY  40 years ago   wedge   PACEMAKER IMPLANT N/A 10/12/2021   Procedure: PACEMAKER IMPLANT;  Surgeon: Waddell Danelle ORN, MD;  Location: [facility] INVASIVE CV LAB;  Service: Cardiovascular;  Laterality: N/A;   TOTAL HIP ARTHROPLASTY Right 07/30/2014   Procedure: TOTAL HIP ARTHROPLASTY ANTERIOR APPROACH;  Surgeon: Maude Herald, MD;  Location: [facility] OR;  Service: Orthopedics;  Laterality: Right;   TOTAL KNEE ARTHROPLASTY Left 11/04/2015   TOTAL KNEE ARTHROPLASTY Left 11/04/2015   Procedure: TOTAL KNEE ARTHROPLASTY;  Surgeon: Maude Herald, MD;  Location: [facility] OR;  Service: Orthopedics;  Laterality: Left;   TYMPANOPLASTY  30 years ago    Allergies  Allergies  Allergen Reactions   Fenofibrate Other (See Comments)    elevated LFTs   Ondansetron  Other (See Comments)    drug interaction    Home Medications    Prior to Admission medications   Medication Sig Start Date End Date Taking? Authorizing Provider  ascorbic acid  (VITAMIN C) 500 MG tablet Take 500 mg by mouth daily.    [provider]  Biotin  5000 MCG TABS Take 5,000 Units by mouth daily with breakfast.    [provider]  Cholecalciferol  (VITAMIN D -3) 1000 UNITS CAPS Take 2,000 Units by mouth daily with breakfast.    [provider]  cyanocobalamin  1000 MCG tablet Take 1,000 mcg by mouth daily.     [provider]  diclofenac Sodium (VOLTAREN) 1 % GEL Apply 2 g topically daily as needed (pain).    [provider]  dofetilide  (TIKOSYN ) 125 MCG capsule Take 1 capsule (125 mcg total) by mouth 2 (two) times daily. 06/17/22    Waddell Danelle ORN, MD  ELIQUIS  5 MG TABS tablet TAKE 1 TABLET BY MOUTH TWICE A DAY 05/02/23   Jeffrie Oneil BROCKS, MD  ferrous sulfate  325 (65 FE) MG tablet Take 325 mg by mouth daily with breakfast.    [provider]  fluticasone  (FLONASE ) 50 MCG/ACT nasal spray Place 2 sprays into both nostrils daily. 11/12/23 12/12/23  Karis Clunes, MD  furosemide  (LASIX ) 40 MG tablet Take 40 mg by mouth daily.    [provider]  gabapentin  (NEURONTIN ) 300 MG capsule Take 300 mg by mouth 2 (two) times daily. 12/02/20   [provider]  hydrALAZINE  (APRESOLINE ) 25 MG tablet TAKE 1 TABLET(25 MG) BY MOUTH IN THE MORNING AND AT BEDTIME 12/14/21   Parthenia Olivia HERO, PA-C  HYDROcodone -acetaminophen  (NORCO/VICODIN) 5-325 MG tablet Take 1 tablet by mouth at bedtime as needed. 04/11/23   [provider]  hydrocortisone  cream 1 % Apply 1 Application topically daily as needed for itching (irritation). Ultra Moisturizing    [provider]  iron  polysaccharides (NIFEREX) 150 MG capsule Take 150 mg by mouth 2 (two) times daily.    [provider]  lidocaine  (LMX) 4 % cream Apply 1 Application topically daily as needed (leg pain).    [provider]  LORazepam  (ATIVAN ) 1 MG tablet Take 1 mg by mouth 2 (two) times daily as needed for anxiety.    [provider]  magnesium  oxide (MAG-OX) 400 (240 Mg) MG tablet TAKE 1 TABLET BY MOUTH EVERY DAY 06/22/22   Waddell Danelle ORN, MD  meclizine  (ANTIVERT ) 12.5 MG tablet Take 1 tablet (12.5 mg total) by mouth daily as needed for dizziness. 12/10/20   Henry Manuelita NOVAK, NP  Omega-3 Fatty Acids (OMEGA 3 500 PO) Take 1 tablet by mouth daily. Multi omega 3    [provider]  OVER THE COUNTER MEDICATION Take 1 tablet by mouth daily. Health brain all day focus    [provider]  OVER THE COUNTER MEDICATION Take 1 capsule by mouth daily. Balance of Nature/Fruits&Vegetables    [provider]  potassium chloride   (KLOR-CON ) 10 MEQ tablet Take 40 mEq by mouth as needed. Take as needed with Furosemide     [provider]  Probiotic Product ([employer] PROBIOTIC) CHEW Chew 1 tablet by mouth daily at 6 (six) AM.    [provider]  pyridOXINE  (VITAMIN B-6) 100 MG tablet Take 100 mg by mouth daily.    [provider]  rosuvastatin  (CRESTOR ) 40 MG tablet Take 1 tablet (40 mg total) by mouth daily at 6 PM. 11/26/19   Claudene Victory ORN, MD  timolol (TIMOPTIC) 0.25 % ophthalmic solution 1 drop. In affected eye twice per day    [provider]  traMADol  (ULTRAM ) 50 MG tablet Take 50 mg by mouth every 6 (six) hours as needed. 03/17/23   [provider]  valsartan  (DIOVAN ) 320 MG tablet Take 1 tablet (320 mg total) by mouth daily. Patient taking differently: Take 320 mg by mouth every evening. 04/23/21   Claudene Victory ORN, MD  vitamin E  400 UNIT capsule Take 400 Units by mouth daily.    [provider]    Physical Exam    Vital Signs:  ANABELA CRAYTON does not have vital signs available for review today.  Given telephonic nature of communication, physical exam is limited. AAOx3. NAD. Normal affect.  Speech and respirations are unlabored.  Accessory Clinical Findings    None  Assessment & Plan    1.  Preoperative Cardiovascular Risk Assessment:     Ms. Aber perioperative risk of a major cardiac event is 0.4% according to the Revised Cardiac Risk Index (RCRI).  Therefore, she is at low risk for perioperative complications.   Her functional capacity is fair at 4.06 METs according to the Duke Activity Status Index (DASI). Recommendations: According to ACC/AHA guidelines, no further cardiovascular testing needed.  The patient may proceed to surgery at acceptable risk.   Antiplatelet and/or Anticoagulation Recommendations: Eliquis  (Apixaban ) can be held for 3 days prior to surgery.  Please resume post op when felt to be safe.     The patient was advised that if she  develops new symptoms prior to surgery to contact our office to arrange for a follow-up visit, and she verbalized understanding.   A copy of this note will be routed to requesting surgeon.  Time:   Today, I have spent 24 minutes with the patient with telehealth technology discussing medical history, symptoms, and management plan.     Orren LOISE Fabry, PA-C  11/16/2023, 12:05 PM

## 2023-12-02 DIAGNOSIS — H53141 Visual discomfort, right eye: Secondary | ICD-10-CM | POA: Diagnosis not present

## 2023-12-02 DIAGNOSIS — H43393 Other vitreous opacities, bilateral: Secondary | ICD-10-CM | POA: Diagnosis not present

## 2023-12-02 DIAGNOSIS — H16141 Punctate keratitis, right eye: Secondary | ICD-10-CM | POA: Diagnosis not present

## 2023-12-03 NOTE — Progress Notes (Signed)
 Remote PPM Transmission

## 2023-12-09 NOTE — Progress Notes (Signed)
 Sent message, via epic in basket, requesting orders in epic from Careers adviser.

## 2023-12-14 NOTE — Progress Notes (Signed)
Second request for pre op orders spoke with: Bethena Roys

## 2023-12-15 ENCOUNTER — Encounter: Payer: Self-pay | Admitting: Internal Medicine

## 2023-12-15 NOTE — Patient Instructions (Signed)
 SURGICAL WAITING ROOM VISITATION  Patients having surgery or a procedure may have no more than 2 support people in the waiting area - these visitors may rotate.    Children under the age of 47 must have an adult with them who is not the patient.  Visitors with respiratory illnesses are discouraged from visiting and should remain at home.  If the patient needs to stay at the hospital during part of their recovery, the visitor guidelines for inpatient rooms apply. Pre-op nurse will coordinate an appropriate time for 1 support person to accompany patient in pre-op.  This support person may not rotate.    Please refer to the [facility] website for the visitor guidelines for Inpatients (after your surgery is over and you are in a regular room).    Your procedure is scheduled on: 12/27/23   Report to [facility] Main Entrance    Report to admitting at 7:30 AM   Call this number if you have problems the morning of surgery [phone]   Do not eat food :After Midnight.   After Midnight you may have the following liquids until 7:00 AM DAY OF SURGERY  Water Non-Citrus Juices (without pulp, NO RED-Apple, White grape, White cranberry) Black Coffee (NO MILK/CREAM OR CREAMERS, sugar ok)  Clear Tea (NO MILK/CREAM OR CREAMERS, sugar ok) regular and decaf                             Plain Jell-O (NO RED)                                           Fruit ices (not with fruit pulp, NO RED)                                     Popsicles (NO RED)                                                               Sports drinks like Gatorade (NO RED)                      If you have questions, please contact your surgeon's office.   FOLLOW BOWEL PREP AND ANY ADDITIONAL PRE OP INSTRUCTIONS YOU RECEIVED FROM YOUR SURGEON'S OFFICE!!!     Oral Hygiene is also important to reduce your risk of infection.                                    Remember - BRUSH YOUR TEETH THE MORNING OF SURGERY WITH YOUR  REGULAR TOOTHPASTE  DENTURES WILL BE REMOVED PRIOR TO SURGERY PLEASE DO NOT APPLY Poly grip OR ADHESIVES!!!   Stop all vitamins and herbal supplements 7 days before surgery.   Take these medicines the morning of surgery with A SIP OF WATER: Gabapentin , Meclizine , Pantoprazole   You may not have any metal on your body including hair pins, jewelry, and body piercing             Do not wear make-up, lotions, powders, perfumes, or deodorant  Do not wear nail polish including gel and S&S, artificial/acrylic nails, or any other type of covering on natural nails including finger and toenails. If you have artificial nails, gel coating, etc. that needs to be removed by a nail salon please have this removed prior to surgery or surgery may need to be canceled/ delayed if the surgeon/ anesthesia feels like they are unable to be safely monitored.   Do not shave  48 hours prior to surgery.    Do not bring valuables to the hospital. [facility] IS NOT             RESPONSIBLE   FOR VALUABLES.   Contacts, glasses, dentures or bridgework may not be worn into surgery.   Bring small overnight bag day of surgery.   DO NOT BRING YOUR HOME MEDICATIONS TO THE HOSPITAL. PHARMACY WILL DISPENSE MEDICATIONS LISTED ON YOUR MEDICATION LIST TO YOU DURING YOUR ADMISSION IN THE HOSPITAL!   Special Instructions: Bring a copy of your healthcare power of attorney and living will documents the day of surgery if you haven't scanned them before.              Please read over the following fact sheets you were given: IF YOU HAVE QUESTIONS ABOUT YOUR PRE-OP INSTRUCTIONS PLEASE CALL [phone] [place].   If you received a COVID test during your pre-op visit  it is requested that you wear a mask when out in public, stay away from anyone that may not be feeling well and notify your surgeon if you develop symptoms. If you test positive for Covid or have been in contact with anyone that has tested  positive in the last 10 days please notify you surgeon.      Pre-operative 4 CHG Bath Instructions  DYNA-Hex 4 Chlorhexidine  Gluconate 4% Solution Antiseptic 4 fl. oz   You can play a key role in reducing the risk of infection after surgery. Your skin needs to be as free of germs as possible. You can reduce the number of germs on your skin by washing with CHG (chlorhexidine  gluconate) soap before surgery. CHG is an antiseptic soap that kills germs and continues to kill germs even after washing.   DO NOT use if you have an allergy to chlorhexidine /CHG or antibacterial soaps. If your skin becomes reddened or irritated, stop using the CHG and notify one of our RNs at   Please shower with the CHG soap starting 4 days before surgery using the following schedule:     Please keep in mind the following:  DO NOT shave, including legs and underarms, starting the day of your first shower.   You may shave your face at any point before/day of surgery.  Place clean sheets on your bed the day you start using CHG soap. Use a clean washcloth (not used since being washed) for each shower. DO NOT sleep with pets once you start using the CHG.  CHG Shower Instructions:  If you choose to wash your hair and private area, wash first with your normal shampoo/soap.  After you use shampoo/soap, rinse your hair and body thoroughly to remove shampoo/soap residue.  Turn the water OFF and apply about 3 tablespoons (45 ml) of CHG soap to a CLEAN washcloth.  Apply CHG soap ONLY FROM  YOUR NECK DOWN TO YOUR TOES (washing for 3-5 minutes)  DO NOT use CHG soap on face, private areas, open wounds, or sores.  Pay special attention to the area where your surgery is being performed.  If you are having back surgery, having someone wash your back for you may be helpful. Wait 2 minutes after CHG soap is applied, then you may rinse off the CHG soap.  Pat dry with a clean towel  Put on clean clothes/pajamas   If you choose to  wear lotion, please use ONLY the CHG-compatible lotions on the back of this paper.     Additional instructions for the day of surgery: DO NOT APPLY any lotions, deodorants, cologne, or perfumes.   Put on clean/comfortable clothes.  Brush your teeth.  Ask your nurse before applying any prescription medications to the skin.   CHG Compatible Lotions   Aveeno Moisturizing lotion  Cetaphil Moisturizing Cream  Cetaphil Moisturizing Lotion  Clairol Herbal Essence Moisturizing Lotion, Dry Skin  Clairol Herbal Essence Moisturizing Lotion, Extra Dry Skin  Clairol Herbal Essence Moisturizing Lotion, Normal Skin  Curel Age Defying Therapeutic Moisturizing Lotion with Alpha Hydroxy  Curel Extreme Care Body Lotion  Curel Soothing Hands Moisturizing Hand Lotion  Curel Therapeutic Moisturizing Cream, Fragrance-Free  Curel Therapeutic Moisturizing Lotion, Fragrance-Free  Curel Therapeutic Moisturizing Lotion, Original Formula  Eucerin Daily Replenishing Lotion  Eucerin Dry Skin Therapy Plus Alpha Hydroxy Crme  Eucerin Dry Skin Therapy Plus Alpha Hydroxy Lotion  Eucerin Original Crme  Eucerin Original Lotion  Eucerin Plus Crme Eucerin Plus Lotion  Eucerin TriLipid Replenishing Lotion  Keri Anti-Bacterial Hand Lotion  Keri Deep Conditioning Original Lotion Dry Skin Formula Softly Scented  Keri Deep Conditioning Original Lotion, Fragrance Free Sensitive Skin Formula  Keri Lotion Fast Absorbing Fragrance Free Sensitive Skin Formula  Keri Lotion Fast Absorbing Softly Scented Dry Skin Formula  Keri Original Lotion  Keri Skin Renewal Lotion Keri Silky Smooth Lotion  Keri Silky Smooth Sensitive Skin Lotion  Nivea Body Creamy Conditioning Oil  Nivea Body Extra Enriched Teacher, adult education Moisturizing Lotion Nivea Crme  Nivea Skin Firming Lotion  NutraDerm 30 Skin Lotion  NutraDerm Skin Lotion  NutraDerm Therapeutic Skin Cream  NutraDerm Therapeutic Skin  Lotion  ProShield Protective Hand Cream  Provon moisturizing lotionView Pre-Surgery Education Videos:  [email or web address]    WHAT IS A BLOOD TRANSFUSION? Blood Transfusion Information  A transfusion is the replacement of blood or some of its parts. Blood is made up of multiple cells which provide different functions. Red blood cells carry oxygen and are used for blood loss replacement. White blood cells fight against infection. Platelets control bleeding. Plasma helps clot blood. Other blood products are available for specialized needs, such as hemophilia or other clotting disorders. BEFORE THE TRANSFUSION  Who gives blood for transfusions?  Healthy volunteers who are fully evaluated to make sure their blood is safe. This is blood bank blood. Transfusion therapy is the safest it has ever been in the practice of medicine. Before blood is taken from a donor, a complete history is taken to make sure that person has no history of diseases nor engages in risky social behavior (examples are intravenous drug use or sexual activity with multiple partners). The donor's travel history is screened to minimize risk of transmitting infections, such as malaria. The donated blood is tested for signs of infectious diseases, such as HIV and hepatitis. The blood is then tested to be sure it is  compatible with you in order to minimize the chance of a transfusion reaction. If you or a relative donates blood, this is often done in anticipation of surgery and is not appropriate for emergency situations. It takes many days to process the donated blood. RISKS AND COMPLICATIONS Although transfusion therapy is very safe and saves many lives, the main dangers of transfusion include:  Getting an infectious disease. Developing a transfusion reaction. This is an allergic reaction to something in the blood you were given. Every precaution is taken to prevent  this. The decision to have a blood transfusion has been considered carefully by your caregiver before blood is given. Blood is not given unless the benefits outweigh the risks. AFTER THE TRANSFUSION Right after receiving a blood transfusion, you will usually feel much better and more energetic. This is especially true if your red blood cells have gotten low (anemic). The transfusion raises the level of the red blood cells which carry oxygen, and this usually causes an energy increase. The nurse administering the transfusion will monitor you carefully for complications. HOME CARE INSTRUCTIONS  No special instructions are needed after a transfusion. You may find your energy is better. Speak with your caregiver about any limitations on activity for underlying diseases you may have. SEEK MEDICAL CARE IF:  Your condition is not improving after your transfusion. You develop redness or irritation at the intravenous (IV) site. SEEK IMMEDIATE MEDICAL CARE IF:  Any of the following symptoms occur over the next 12 hours: Shaking chills. You have a temperature by mouth above 102 F (38.9 C), not controlled by medicine. Chest, back, or muscle pain. People around you feel you are not acting correctly or are confused. Shortness of breath or difficulty breathing. Dizziness and fainting. You get a rash or develop hives. You have a decrease in urine output. Your urine turns a dark color or changes to pink, red, or brown. Any of the following symptoms occur over the next 10 days: You have a temperature by mouth above 102 F (38.9 C), not controlled by medicine. Shortness of breath. Weakness after normal activity. The white part of the eye turns yellow (jaundice). You have a decrease in the amount of urine or are urinating less often. Your urine turns a dark color or changes to pink, red, or brown. Document Released: 02/20/2000 Document Revised: 05/17/2011 Document Reviewed: 10/09/2007 [facility] Patient  Information 2014 [place], [place].  _______________________________________________________________________

## 2023-12-15 NOTE — Progress Notes (Signed)
 PERIOPERATIVE PRESCRIPTION FOR IMPLANTED CARDIAC DEVICE PROGRAMMING  Patient Information: Name:  Sheila Gardner  DOB:  [birth year]  MRN:  [id number]    Planned Procedure:  total hip arthroplasty- anterior- Left  Surgeon:  Dr. Sheril  Date of Procedure:  12/27/23  Cautery will be used.  Position during surgery:  unknown   Please send documentation back to:  Darryle Law (Fax # [phone])  Device Information:  Clinic EP Physician:  Danelle Birmingham, MD   Device Type:  Pacemaker Manufacturer and Phone #:  Medtronic: [id number] Pacemaker Dependent?:  No. Date of Last Device Check:  10/12/2023  Normal Device Function?:  Yes.    Electrophysiologist's Recommendations:  Have magnet available. Provide continuous ECG monitoring when magnet is used or reprogramming is to be performed.  Procedure should not interfere with device function.  No device programming or magnet placement needed.  Per Device Clinic Standing Orders, Sheila ONEIDA Shutter, RN  12:32 PM 12/15/2023

## 2023-12-15 NOTE — Progress Notes (Addendum)
 COVID Vaccine Completed: yes  Date of COVID positive in last 90 days:  PCP - Carlin Redbird, MD Cardiologist - Oneil Parchment, MD Electrophysiologist- Danelle Birmingham, MD  Cardiac clearance by Orren Fabry, PA LOV 11/16/23 Epic  Chest x-ray - N/A EKG - 04/19/23 Epic Stress Test - long time ago per pt ECHO - 11/22/22 Epic Cardiac Cath - 12/08/20 Epic Pacemaker/ICD device last checked: 10/12/23 Epic- orders in Epic dated 12/15/23 Spinal Cord Stimulator:N/A  Bowel Prep - N/A  Sleep Study - N/A CPAP -   Fasting Blood Sugar - preDM, no meds or checks at home Checks Blood Sugar _____ times a day  Last dose of GLP1 agonist-  N/A GLP1 instructions:  Do not take after     Last dose of SGLT-2 inhibitors-  N/A SGLT-2 instructions:  Do not take after     Blood Thinner Instructions: Eliquis , hold 3 days Aspirin  Instructions:N/A Last Dose: 12/23/23 1900   Activity level: Can perform activities of daily living without stopping and without symptoms of chest pain. Patient endorses SOB with activity. Ambulates with Rolator due to hip pain, no stairs due to hip pain   Anesthesia review: CAD, HTN, NSTEMI, A fib, pacemaker  Patient denies shortness of breath, fever, cough and chest pain at PAT appointment  Patient verbalized understanding of instructions that were given to them at the PAT appointment. Patient was also instructed that they will need to review over the PAT instructions again at home before surgery.

## 2023-12-15 NOTE — Progress Notes (Signed)
 Please place orders for PAT appointment scheduled 12/16/23.

## 2023-12-16 ENCOUNTER — Encounter (HOSPITAL_COMMUNITY): Payer: Self-pay

## 2023-12-16 ENCOUNTER — Other Ambulatory Visit: Payer: Self-pay

## 2023-12-16 ENCOUNTER — Encounter (HOSPITAL_COMMUNITY)
Admission: RE | Admit: 2023-12-16 | Discharge: 2023-12-16 | Disposition: A | Discharge status: Home / Self Care | Source: Ambulatory Visit | Attending: Orthopaedic Surgery | Admitting: Orthopaedic Surgery

## 2023-12-16 VITALS — BP 134/68 | HR 72 | Temp 98.0°F | Resp 16 | Ht 64.5 in | Wt 189.0 lb

## 2023-12-16 DIAGNOSIS — Z955 Presence of coronary angioplasty implant and graft: Secondary | ICD-10-CM | POA: Insufficient documentation

## 2023-12-16 DIAGNOSIS — I4891 Unspecified atrial fibrillation: Secondary | ICD-10-CM | POA: Insufficient documentation

## 2023-12-16 DIAGNOSIS — Z7901 Long term (current) use of anticoagulants: Secondary | ICD-10-CM | POA: Insufficient documentation

## 2023-12-16 DIAGNOSIS — Z853 Personal history of malignant neoplasm of breast: Secondary | ICD-10-CM | POA: Diagnosis not present

## 2023-12-16 DIAGNOSIS — I10 Essential (primary) hypertension: Secondary | ICD-10-CM | POA: Diagnosis not present

## 2023-12-16 DIAGNOSIS — I252 Old myocardial infarction: Secondary | ICD-10-CM | POA: Diagnosis not present

## 2023-12-16 DIAGNOSIS — Z01812 Encounter for preprocedural laboratory examination: Secondary | ICD-10-CM | POA: Insufficient documentation

## 2023-12-16 DIAGNOSIS — I214 Non-ST elevation (NSTEMI) myocardial infarction: Secondary | ICD-10-CM

## 2023-12-16 DIAGNOSIS — M1612 Unilateral primary osteoarthritis, left hip: Secondary | ICD-10-CM | POA: Insufficient documentation

## 2023-12-16 DIAGNOSIS — Z950 Presence of cardiac pacemaker: Secondary | ICD-10-CM | POA: Insufficient documentation

## 2023-12-16 DIAGNOSIS — Z01818 Encounter for other preprocedural examination: Secondary | ICD-10-CM

## 2023-12-16 HISTORY — DX: Pneumonia, unspecified organism: J18.9

## 2023-12-16 LAB — CBC
HCT: 34.9 % — ABNORMAL LOW (ref 36.0–46.0)
Hemoglobin: 11.3 g/dL — ABNORMAL LOW (ref 12.0–15.0)
MCH: 30.5 pg (ref 26.0–34.0)
MCHC: 32.4 g/dL (ref 30.0–36.0)
MCV: 94.3 fL (ref 80.0–100.0)
Platelets: 265 K/uL (ref 150–400)
RBC: 3.7 MIL/uL — ABNORMAL LOW (ref 3.87–5.11)
RDW: 12.7 % (ref 11.5–15.5)
WBC: 7.1 K/uL (ref 4.0–10.5)
nRBC: 0 % (ref 0.0–0.2)

## 2023-12-16 LAB — TYPE AND SCREEN
ABO/RH(D): B POS
Antibody Screen: NEGATIVE

## 2023-12-16 LAB — BASIC METABOLIC PANEL WITH GFR
Anion gap: 11 (ref 5–15)
BUN: 16 mg/dL (ref 8–23)
CO2: 27 mmol/L (ref 22–32)
Calcium: 10.5 mg/dL — ABNORMAL HIGH (ref 8.9–10.3)
Chloride: 97 mmol/L — ABNORMAL LOW (ref 98–111)
Creatinine, Ser: 0.73 mg/dL (ref 0.44–1.00)
GFR, Estimated: >60 mL/min (ref >60)
Glucose, Bld: 136 mg/dL — ABNORMAL HIGH (ref 70–99)
Potassium: 4.2 mmol/L (ref 3.5–5.1)
Sodium: 135 mmol/L (ref 135–145)

## 2023-12-16 LAB — SURGICAL PCR SCREEN
MRSA, PCR: NEGATIVE
Staphylococcus aureus: NEGATIVE

## 2023-12-19 ENCOUNTER — Other Ambulatory Visit: Payer: Self-pay | Admitting: Orthopaedic Surgery

## 2023-12-19 NOTE — Progress Notes (Signed)
 Anesthesia Chart Review   Case: [id number] Date/Time: 12/27/23 0948   Procedure: ARTHROPLASTY, HIP, TOTAL, ANTERIOR APPROACH (Left: Hip)   Anesthesia type: Spinal   Pre-op diagnosis: LEFT HIP DEGENERATIVE  JOINT DISEASE   Location: [facility] ROOM 06 / [facility]   Surgeons: Sheril Coy, MD       DISCUSSION:88 y.o. never smoker with h/o PONV, HTN, atrial fibrillation, sinus node dysfunction with PPM in place, breast cancer, left hip djd scheduled for above procedure 12/27/2023 with Dr. Coy Sheril.   Pt seen by cardiology 11/16/2023 for preoperative evaluation.  Per OV note, Ms. Sees's perioperative risk of a major cardiac event is 0.4% according to the Revised Cardiac Risk Index (RCRI).  Therefore, she is at low risk for perioperative complications.   Her functional capacity is fair at 4.06 METs according to the Duke Activity Status Index (DASI). Recommendations: According to ACC/AHA guidelines, no further cardiovascular testing needed.  The patient may proceed to surgery at acceptable risk.   Antiplatelet and/or Anticoagulation Recommendations: Eliquis  (Apixaban ) can be held for 3 days prior to surgery.  Please resume post op when felt to be safe.    Device orders in 12/15/2023 progress note.  Procedure should not interfere, no device programming or magnet placement needed.   Pt reports last dose of Eliquis  12/23/2023 PM dose.  VS: BP 134/68   Pulse 72   Temp 36.7 C (Oral)   Resp 16   Ht 5' 4.5 (1.638 m)   Wt 85.7 kg   SpO2 100%   BMI 31.94 kg/m   PROVIDERS: Okey Carlin Redbird, MD is PCP  Cardiologist - Oneil Parchment, MD Electrophysiologist- Danelle Birmingham, MD  LABS: Labs reviewed: Acceptable for surgery. (all labs ordered are listed, but only abnormal results are displayed)  Labs Reviewed  CBC - Abnormal; Notable for the following components:      Result Value   RBC 3.70 (*)    Hemoglobin 11.3 (*)    HCT 34.9 (*)    All other components within normal limits  BASIC  METABOLIC PANEL WITH GFR - Abnormal; Notable for the following components:   Chloride 97 (*)    Glucose, Bld 136 (*)    Calcium  10.5 (*)    All other components within normal limits  SURGICAL PCR SCREEN  TYPE AND SCREEN     IMAGES:   EKG:   CV: Echo 11/22/2022  1. Left ventricular ejection fraction, by estimation, is 60 to 65%. Left  ventricular ejection fraction by 3D volume is 65 %. The left ventricle has  normal function. The left ventricle has no regional wall motion  abnormalities. There is moderate left  ventricular hypertrophy of the septal segment. Left ventricular diastolic  parameters are consistent with Grade I diastolic dysfunction (impaired  relaxation). The average left ventricular global longitudinal strain is  -20.3 %. The global longitudinal  strain is normal.   2. Right ventricular systolic function is normal. The right ventricular  size is normal. There is normal pulmonary artery systolic pressure. The  estimated right ventricular systolic pressure is 26.8 mmHg.   3. The mitral valve is normal in structure. No evidence of mitral valve  regurgitation. No evidence of mitral stenosis. Moderate mitral annular  calcification.   4. The aortic valve is tricuspid. There is mild calcification of the  aortic valve. There is mild thickening of the aortic valve. Aortic valve  regurgitation is not visualized. No aortic stenosis is present.   5. The inferior vena cava is normal  in size with greater than 50%  respiratory variability, suggesting right atrial pressure of 3 mmHg.   Comparison(s): No significant change from prior study. Prior images  reviewed side by side.  Past Medical History:  Diagnosis Date   Arthritis    Atrial fibrillation (HCC)    Breast cancer (HCC)    Cancer (HCC)    Breast- rt   Cough with sputum 2016   Elevated liver function tests 05/09/2013   GERD (gastroesophageal reflux disease)    COSTOCHONDRITIS   Heart murmur    Hip pain  05/08/2014   Hyperlipidemia    Hypertension    Lipoma    back of neck   Pneumonia    PONV (postoperative nausea and vomiting)    only after rt hip surgery   Primary osteoarthritis of left knee 11/04/2015   Primary osteoarthritis of right hip 07/30/2014   Squamous cell carcinoma of skin 08/21/2019   atypical squa. proliferation-Left corner of mouth    Past Surgical History:  Procedure Laterality Date   ANKLE FRACTURE SURGERY Bilateral 2013   pins   BREAST LUMPECTOMY Right 2011   BREAST SURGERY  04/22/2009   Rt lumpectomy   CARDIOVERSION N/A 02/19/2019   Procedure: CARDIOVERSION;  Surgeon: Maranda Leim DEL, MD;  Location: [facility] ENDOSCOPY;  Service: Cardiovascular;  Laterality: N/A;   CORONARY ATHERECTOMY N/A 04/30/2019   Procedure: CORONARY ATHERECTOMY;  Surgeon: Claudene Victory ORN, MD;  Location: [facility] INVASIVE CV LAB;  Service: Cardiovascular;  Laterality: N/A;  LAD   CORONARY STENT INTERVENTION N/A 04/30/2019   Procedure: CORONARY STENT INTERVENTION;  Surgeon: Claudene Victory ORN, MD;  Location: [facility]AS[facility] CV LAB;  Service: Cardiovascular;  Laterality: N/A;  DES TO PROX-MID LAD   EYE SURGERY  2001   macular hole   JOINT REPLACEMENT  2016   KNEE SURGERY     LEFT HEART CATH AND CORONARY ANGIOGRAPHY N/A 04/30/2019   Procedure: LEFT HEART CATH AND CORONARY ANGIOGRAPHY;  Surgeon: Claudene Victory ORN, MD;  Location: [facility];  Service: Cardiovascular;  Laterality: N/A;   LEFT HEART CATH AND CORONARY ANGIOGRAPHY N/A 12/08/2020   Procedure: LEFT HEART CATH AND CORONARY ANGIOGRAPHY;  Surgeon: Burnard Debby LABOR, MD;  Location: [facility];  Service: Cardiovascular;  Laterality: N/A;   LOOP RECORDER REMOVAL N/A 10/12/2021   Procedure: LOOP RECORDER REMOVAL;  Surgeon: Waddell Danelle ORN, MD;  Location: [facility];  Service: Cardiovascular;  Laterality: N/A;   MIDDLE EAR SURGERY Left 08/18/2015   repair eardrum and reconstruction   OVARY SURGERY  40 years ago   wedge   PACEMAKER  IMPLANT N/A 10/12/2021   Procedure: PACEMAKER IMPLANT;  Surgeon: Waddell Danelle ORN, MD;  Location: [facility];  Service: Cardiovascular;  Laterality: N/A;   TOTAL HIP ARTHROPLASTY Right 07/30/2014   Procedure: TOTAL HIP ARTHROPLASTY ANTERIOR APPROACH;  Surgeon: Maude Herald, MD;  Location: [facility];  Service: Orthopedics;  Laterality: Right;   TOTAL KNEE ARTHROPLASTY Left 11/04/2015   TOTAL KNEE ARTHROPLASTY Left 11/04/2015   Procedure: TOTAL KNEE ARTHROPLASTY;  Surgeon: Maude Herald, MD;  Location: [facility];  Service: Orthopedics;  Laterality: Left;   TYMPANOPLASTY  30 years ago    MEDICATIONS:  Ascorbic Acid  (VITAMIN C PO)   B Complex-C (B-COMPLEX WITH VITAMIN C) tablet   Biotin  5000 MCG TABS   Cholecalciferol  (VITAMIN D -3 PO)   Cyanocobalamin  (B-12) 3000 MCG SUBL   dofetilide  (TIKOSYN ) 125 MCG capsule   dorzolamide-timolol (COSOPT) 2-0.5 % ophthalmic solution  ELIQUIS  5 MG TABS tablet   ferrous sulfate  325 (65 FE) MG tablet   fluticasone  (FLONASE ) 50 MCG/ACT nasal spray   furosemide  (LASIX ) 40 MG tablet   gabapentin  (NEURONTIN ) 600 MG tablet   hydrALAZINE  (APRESOLINE ) 25 MG tablet   hydrocortisone  cream 1 %   iron  polysaccharides (NIFEREX) 150 MG capsule   lidocaine  (LMX) 4 % cream   magnesium  oxide (MAG-OX) 400 MG tablet   meclizine  (ANTIVERT ) 12.5 MG tablet   Multiple Vitamins-Minerals (PRESERVISION AREDS 2 PO)   Nutritional Supplements (FRUIT & VEGETABLE DAILY) CAPS   Omega-3 Fatty Acids (OMEGA 3 500 PO)   pantoprazole  (PROTONIX ) 20 MG tablet   potassium chloride  (KLOR-CON  M) 10 MEQ tablet   Probiotic Product ([employer] PROBIOTIC) CHEW   pyridOXINE  (VITAMIN B-6) 100 MG tablet   rosuvastatin  (CRESTOR ) 40 MG tablet   Specialty Vitamins Products (MEMORY COMPLEX BRAIN HEALTH PO)   valsartan  (DIOVAN ) 320 MG tablet   No current facility-administered medications for this encounter.    Harlene Hoots Ward, PA-C [facility] [phone]

## 2023-12-19 NOTE — Anesthesia Preprocedure Evaluation (Addendum)
 Anesthesia Evaluation  Patient identified by MRN, date of birth, ID band Patient awake    Reviewed: Allergy & Precautions, NPO status , Patient's Chart, lab work & pertinent test results  History of Anesthesia Complications (+) PONV and history of anesthetic complications  Airway Mallampati: II  TM Distance: >3 FB Neck ROM: Full    Dental no notable dental hx.    Pulmonary    Pulmonary exam normal        Cardiovascular hypertension, Pt. on medications + CAD and + Past MI  + dysrhythmias Atrial Fibrillation + pacemaker + Valvular Problems/Murmurs  Rhythm:Regular Rate:Normal  CV: Echo 11/22/2022  1. Left ventricular ejection fraction, by estimation, is 60 to 65%. Left  ventricular ejection fraction by 3D volume is 65 %. The left ventricle has  normal function. The left ventricle has no regional wall motion  abnormalities. There is moderate left  ventricular hypertrophy of the septal segment. Left ventricular diastolic  parameters are consistent with Grade I diastolic dysfunction (impaired  relaxation). The average left ventricular global longitudinal strain is  -20.3 %. The global longitudinal  strain is normal.   2. Right ventricular systolic function is normal. The right ventricular  size is normal. There is normal pulmonary artery systolic pressure. The  estimated right ventricular systolic pressure is 26.8 mmHg.   3. The mitral valve is normal in structure. No evidence of mitral valve  regurgitation. No evidence of mitral stenosis. Moderate mitral annular  calcification.   4. The aortic valve is tricuspid. There is mild calcification of the  aortic valve. There is mild thickening of the aortic valve. Aortic valve  regurgitation is not visualized. No aortic stenosis is present.   5. The inferior vena cava is normal in size with greater than 50%  respiratory variability, suggesting right atrial pressure of 3 mmHg.       Neuro/Psych negative neurological ROS  negative psych ROS   GI/Hepatic Neg liver ROS,GERD  Medicated,,  Endo/Other  negative endocrine ROS    Renal/GU negative Renal ROS  negative genitourinary   Musculoskeletal  (+) Arthritis , Osteoarthritis,    Abdominal Normal abdominal exam  (+)   Peds  Hematology  (+) Blood dyscrasia, anemia Lab Results      Component                Value               Date                      WBC                      7.1                 12/16/2023                HGB                      11.3 (L)            12/16/2023                HCT                      34.9 (L)            12/16/2023                MCV  94.3                12/16/2023                PLT                      265                 12/16/2023             Lab Results      Component                Value               Date                      NA                       135                 12/16/2023                K                        4.2                 12/16/2023                CO2                      27                  12/16/2023                GLUCOSE                  136 (H)             12/16/2023                BUN                      16                  12/16/2023                CREATININE               0.73                12/16/2023                CALCIUM                   10.5 (H)            12/16/2023                EGFR                     87                  06/13/2023                GFRNONAA                 >60  12/16/2023              Anesthesia Other Findings Eliquis  LD 12/23/23  Reproductive/Obstetrics                              Anesthesia Physical Anesthesia Plan  ASA: 3  Anesthesia Plan: MAC and Spinal   Post-op Pain Management:    Induction: Intravenous  PONV Risk Score and Plan: 3 and Ondansetron , Dexamethasone , Propofol  infusion and Treatment may vary due to age or medical  condition  Airway Management Planned: Simple Face Mask and Nasal Cannula  Additional Equipment: None  Intra-op Plan:   Post-operative Plan:   Informed Consent: I have reviewed the patients History and Physical, chart, labs and discussed the procedure including the risks, benefits and alternatives for the proposed anesthesia with the patient or authorized representative who has indicated his/her understanding and acceptance.     Dental advisory given  Plan Discussed with: CRNA  Anesthesia Plan Comments: (See PAT note 12/16/2023)         Anesthesia Quick Evaluation

## 2023-12-22 ENCOUNTER — Other Ambulatory Visit (HOSPITAL_COMMUNITY): Payer: Self-pay | Admitting: Orthopaedic Surgery

## 2023-12-22 ENCOUNTER — Ambulatory Visit (HOSPITAL_COMMUNITY)
Admission: RE | Admit: 2023-12-22 | Discharge: 2023-12-22 | Disposition: A | Discharge status: Home / Self Care | Source: Ambulatory Visit | Attending: Vascular Surgery | Admitting: Vascular Surgery

## 2023-12-22 DIAGNOSIS — M79605 Pain in left leg: Secondary | ICD-10-CM

## 2023-12-22 NOTE — Care Plan (Signed)
 Ortho Bundle Case Management Note  Patient Details  Name: Sheila Gardner MRN: [id number] Date of Birth: 10/17/1935                 spoke with patient. she is planning to discharge to home with family and friends to assist. [facility] will provide some PCS services and [facility] will provide HHPT/OT. OPPT set up with [facility] [facility] discharge instructions discussed and mailed. questions answered. Patient and MD in agreement. Choice offered.    DME Arranged:    DME Agency:     HH Arranged:  PT HH Agency:     Additional Comments: Please contact me with any questions of if this plan should need to change.  Charlies Pitch,  RN,BSN,MHA,CCM  [facility]  [phone] 12/22/2023, 11:35 AM

## 2023-12-23 ENCOUNTER — Ambulatory Visit (HOSPITAL_COMMUNITY)

## 2023-12-26 NOTE — H&P (Signed)
 TOTAL HIP ADMISSION H&P  Patient is admitted for left total hip arthroplasty.  Subjective:  Chief Complaint: left hip pain  HPI: Sheila Gardner, 88 y.o. female, has a history of pain and functional disability in the left hip(s) due to arthritis and patient has failed non-surgical conservative treatments for greater than 12 weeks to include NSAID's and/or analgesics, corticosteriod injections, viscosupplementation injections, flexibility and strengthening excercises, use of assistive devices, weight reduction as appropriate, and activity modification.  Onset of symptoms was gradual starting 5 years ago with gradually worsening course since that time.The patient noted no past surgery on the left hip(s).  Patient currently rates pain in the left hip at 10 out of 10 with activity. Patient has night pain, worsening of pain with activity and weight bearing, trendelenberg gait, pain that interfers with activities of daily living, crepitus, and joint swelling. Patient has evidence of subchondral cysts, subchondral sclerosis, periarticular osteophytes, and joint space narrowing by imaging studies. This condition presents safety issues increasing the risk of falls. There is no current active infection.  Patient Active Problem List   Diagnosis Date Noted   Chronic rhinitis 11/12/2023   Deviated nasal septum 11/12/2023   Hypertrophy of nasal turbinates 11/12/2023   Anosmia 11/12/2023   Sensorineural hearing loss, bilateral 11/12/2023   Referred otalgia of right ear 11/12/2023   Pacemaker 01/21/2022   Calculus of gallbladder without cholecystitis without obstruction 01/14/2022   Deficiency anemia 08/13/2021   Monocytosis 08/13/2021   Sinus node dysfunction (HCC) 03/26/2021   Paroxysmal atrial fibrillation (HCC) 03/26/2021   Elevated troponin    NSTEMI (non-ST elevated myocardial infarction) (HCC) 12/07/2020   Gastroesophageal reflux disease 11/06/2020   Dizziness 01/11/2020   Syncope 09/19/2019    Secondary hypercoagulable state 06/15/2019   Persistent atrial fibrillation (HCC) 06/11/2019   CAD (coronary artery disease) 04/30/2019   CAD (coronary artery disease), native coronary artery 04/28/2019   Hyperlipidemia LDL goal <70 04/28/2019   Hypertension 04/28/2019   History of total knee replacement, left 03/23/2019   Primary osteoarthritis of left knee 11/04/2015   Primary osteoarthritis of right hip 07/30/2014   Hip pain 05/08/2014   Elevated liver function tests 05/09/2013   Vitamin D  deficiency 03/25/2011   History of right breast cancer 12/30/2010   Past Medical History:  Diagnosis Date   Arthritis    Atrial fibrillation (HCC)    Breast cancer (HCC)    Cancer (HCC)    Breast- rt   Cough with sputum 2016   Elevated liver function tests 05/09/2013   GERD (gastroesophageal reflux disease)    COSTOCHONDRITIS   Heart murmur    Hip pain 05/08/2014   Hyperlipidemia    Hypertension    Lipoma    back of neck   Pneumonia    PONV (postoperative nausea and vomiting)    only after rt hip surgery   Primary osteoarthritis of left knee 11/04/2015   Primary osteoarthritis of right hip 07/30/2014   Squamous cell carcinoma of skin 08/21/2019   atypical squa. proliferation-Left corner of mouth    Past Surgical History:  Procedure Laterality Date   ANKLE FRACTURE SURGERY Bilateral 2013   pins   BREAST LUMPECTOMY Right 2011   BREAST SURGERY  04/22/2009   Rt lumpectomy   CARDIOVERSION N/A 02/19/2019   Procedure: CARDIOVERSION;  Surgeon: Maranda Leim DEL, MD;  Location: [facility] ENDOSCOPY;  Service: Cardiovascular;  Laterality: N/A;   CORONARY ATHERECTOMY N/A 04/30/2019   Procedure: CORONARY ATHERECTOMY;  Surgeon: Claudene Victory ORN, MD;  Location:  [facility] INVASIVE CV LAB;  Service: Cardiovascular;  Laterality: N/A;  LAD   CORONARY STENT INTERVENTION N/A 04/30/2019   Procedure: CORONARY STENT INTERVENTION;  Surgeon: Claudene Victory ORN, MD;  Location: [facility] INVASIVE CV LAB;  Service: Cardiovascular;   Laterality: N/A;  DES TO PROX-MID LAD   EYE SURGERY  2001   macular hole   JOINT REPLACEMENT  2016   KNEE SURGERY     LEFT HEART CATH AND CORONARY ANGIOGRAPHY N/A 04/30/2019   Procedure: LEFT HEART CATH AND CORONARY ANGIOGRAPHY;  Surgeon: Claudene Victory ORN, MD;  Location: [facility] INVASIVE CV LAB;  Service: Cardiovascular;  Laterality: N/A;   LEFT HEART CATH AND CORONARY ANGIOGRAPHY N/A 12/08/2020   Procedure: LEFT HEART CATH AND CORONARY ANGIOGRAPHY;  Surgeon: Burnard Debby LABOR, MD;  Location: [facility] INVASIVE CV LAB;  Service: Cardiovascular;  Laterality: N/A;   LOOP RECORDER REMOVAL N/A 10/12/2021   Procedure: LOOP RECORDER REMOVAL;  Surgeon: Waddell Danelle ORN, MD;  Location: [facility] INVASIVE CV LAB;  Service: Cardiovascular;  Laterality: N/A;   MIDDLE EAR SURGERY Left 08/18/2015   repair eardrum and reconstruction   OVARY SURGERY  40 years ago   wedge   PACEMAKER IMPLANT N/A 10/12/2021   Procedure: PACEMAKER IMPLANT;  Surgeon: Waddell Danelle ORN, MD;  Location: [facility] INVASIVE CV LAB;  Service: Cardiovascular;  Laterality: N/A;   TOTAL HIP ARTHROPLASTY Right 07/30/2014   Procedure: TOTAL HIP ARTHROPLASTY ANTERIOR APPROACH;  Surgeon: Maude Herald, MD;  Location: [facility] OR;  Service: Orthopedics;  Laterality: Right;   TOTAL KNEE ARTHROPLASTY Left 11/04/2015   TOTAL KNEE ARTHROPLASTY Left 11/04/2015   Procedure: TOTAL KNEE ARTHROPLASTY;  Surgeon: Maude Herald, MD;  Location: [facility] OR;  Service: Orthopedics;  Laterality: Left;   TYMPANOPLASTY  30 years ago    No current facility-administered medications for this encounter.   Current Outpatient Medications  Medication Sig Dispense Refill Last Dose/Taking   Ascorbic Acid  (VITAMIN C PO) Take 1,000 mg by mouth daily in the afternoon.   Taking   B Complex-C (B-COMPLEX WITH VITAMIN C) tablet Take 1 tablet by mouth daily in the afternoon.   Taking   Biotin  5000 MCG TABS Take 5,000 Units by mouth daily in the afternoon.   Taking   Cholecalciferol  (VITAMIN D -3 PO) Take 2,000  Units by mouth daily in the afternoon.   Taking   Cyanocobalamin  (B-12) 3000 MCG SUBL Place under the tongue.   Taking   dofetilide  (TIKOSYN ) 125 MCG capsule Take 1 capsule (125 mcg total) by mouth 2 (two) times daily. 180 capsule 3 Taking   dorzolamide-timolol (COSOPT) 2-0.5 % ophthalmic solution Place 1 drop into both eyes 2 (two) times daily.   Taking   ELIQUIS  5 MG TABS tablet TAKE 1 TABLET BY MOUTH TWICE A DAY 180 tablet 1 Taking   ferrous sulfate  325 (65 FE) MG tablet Take 325 mg by mouth daily in the afternoon.   Taking   fluticasone  (FLONASE ) 50 MCG/ACT nasal spray Place 2 sprays into both nostrils daily. (Patient taking differently: Place 2 sprays into both nostrils daily as needed for allergies.) 16 g 10 Taking Differently   furosemide  (LASIX ) 40 MG tablet Take 40 mg by mouth in the morning.   Taking   gabapentin  (NEURONTIN ) 600 MG tablet Take 600 mg by mouth 3 (three) times daily.   Taking   hydrALAZINE  (APRESOLINE ) 25 MG tablet TAKE 1 TABLET(25 MG) BY MOUTH IN THE MORNING AND AT BEDTIME (Patient taking differently: Take 25 mg by mouth 2 (two) times daily  as needed (systolic bp greater than 170).) 180 tablet 3 Taking Differently   hydrocortisone  cream 1 % Apply 1 Application topically daily as needed for itching (irritation). Ultra Moisturizing   Taking As Needed   iron  polysaccharides (NIFEREX) 150 MG capsule Take 150 mg by mouth 2 (two) times daily.   Taking   lidocaine  (LMX) 4 % cream Apply 1 Application topically daily as needed (leg pain).   Taking As Needed   magnesium  oxide (MAG-OX) 400 MG tablet Take 400 mg by mouth in the morning.   Taking   meclizine  (ANTIVERT ) 12.5 MG tablet Take 1 tablet (12.5 mg total) by mouth daily as needed for dizziness. 30 tablet 0 Taking As Needed   Multiple Vitamins-Minerals (PRESERVISION AREDS 2 PO) Take 1 tablet by mouth in the morning.   Taking   Nutritional Supplements (FRUIT & VEGETABLE DAILY) CAPS Take 6 capsules by mouth daily in the afternoon.  (3) Fruit + (3) Vegetable   Taking   Omega-3 Fatty Acids (OMEGA 3 500 PO) Take 2 capsules by mouth daily. Multi omega 3   Taking   pantoprazole  (PROTONIX ) 20 MG tablet Take 20 mg by mouth in the morning.   Taking   potassium chloride  (KLOR-CON  M) 10 MEQ tablet Take 40 mEq by mouth in the morning.   Taking   Probiotic Product ([employer] PROBIOTIC) CHEW Chew 1 tablet by mouth daily in the afternoon.   Taking   pyridOXINE  (VITAMIN B-6) 100 MG tablet Take 100 mg by mouth daily in the afternoon.   Taking   rosuvastatin  (CRESTOR ) 40 MG tablet Take 1 tablet (40 mg total) by mouth daily at 6 PM. (Patient taking differently: Take 40 mg by mouth at bedtime.) 30 tablet 6 Taking Differently   Specialty Vitamins Products (MEMORY COMPLEX BRAIN HEALTH PO) Take 1 capsule by mouth daily in the afternoon.   Taking   valsartan  (DIOVAN ) 320 MG tablet Take 1 tablet (320 mg total) by mouth daily. 90 tablet 3 Taking   Allergies  Allergen Reactions   Fenofibrate Other (See Comments)    elevated LFTs   Ondansetron  Other (See Comments)    drug interaction    Social History   Tobacco Use   Smoking status: Never   Smokeless tobacco: Never  Substance Use Topics   Alcohol use: Not Currently    Family History  Problem Relation Age of Onset   Heart disease Mother    Cancer Brother        colon, pancreatic, brain     Review of Systems  Musculoskeletal:  Positive for arthralgias.       Left hip  All other systems reviewed and are negative.   Objective:  Physical Exam Constitutional:      Appearance: Normal appearance.  HENT:     Head: Normocephalic and atraumatic.     Nose: Nose normal.     Mouth/Throat:     Pharynx: Oropharynx is clear.  Eyes:     Extraocular Movements: Extraocular movements intact.  Cardiovascular:     Pulses: Normal pulses.  Abdominal:     Palpations: Abdomen is soft.  Musculoskeletal:     Cervical back: Normal range of motion.     Comments: Examination of the left hip shows  significant limited range of motion with terrible pain with any internal/external rotation.  She walks with assistance from a walker.  She has an antalgic gait.  Calf is soft and nontender.  She is neurovascularly intact distally.    Skin:  General: Skin is warm and dry.  Neurological:     General: No focal deficit present.     Mental Status: She is alert and oriented to person, place, and time.  Psychiatric:        Mood and Affect: Mood normal.        Behavior: Behavior normal.        Thought Content: Thought content normal.        Judgment: Judgment normal.     Vital signs in last 24 hours:    Labs:   Estimated body mass index is 31.94 kg/m as calculated from the following:   Height as of 12/16/23: 5' 4.5 (1.638 m).   Weight as of 12/16/23: 85.7 kg.   Imaging Review Plain radiographs demonstrate severe degenerative joint disease of the left hip(s). The bone quality appears to be good for age and reported activity level.      Assessment/Plan:  End stage primary arthritis, left hip(s)  The patient history, physical examination, clinical judgement of the provider and imaging studies are consistent with end stage degenerative joint disease of the left hip(s) and total hip arthroplasty is deemed medically necessary. The treatment options including medical management, injection therapy, arthroscopy and arthroplasty were discussed at length. The risks and benefits of total hip arthroplasty were presented and reviewed. The risks due to aseptic loosening, infection, stiffness, dislocation/subluxation,  thromboembolic complications and other imponderables were discussed.  The patient acknowledged the explanation, agreed to proceed with the plan and consent was signed. Patient is being admitted for inpatient treatment for surgery, pain control, PT, OT, prophylactic antibiotics, VTE prophylaxis, progressive ambulation and ADL's and discharge planning.The patient is planning to be  discharged home with home health services

## 2023-12-27 ENCOUNTER — Encounter (HOSPITAL_COMMUNITY)
Admission: RE | Disposition: A | Discharge status: Home Health | Payer: Self-pay | Source: Home / Self Care | Attending: Orthopaedic Surgery

## 2023-12-27 ENCOUNTER — Ambulatory Visit (HOSPITAL_COMMUNITY): Admitting: Anesthesiology

## 2023-12-27 ENCOUNTER — Other Ambulatory Visit: Payer: Self-pay

## 2023-12-27 ENCOUNTER — Ambulatory Visit (HOSPITAL_COMMUNITY): Payer: Self-pay | Admitting: Physician Assistant

## 2023-12-27 ENCOUNTER — Inpatient Hospital Stay (HOSPITAL_COMMUNITY)
Admission: RE | Admit: 2023-12-27 | Discharge: 2023-12-29 | DRG: 470 | Disposition: A | Discharge status: Home Health | Attending: Orthopaedic Surgery | Admitting: Orthopaedic Surgery

## 2023-12-27 ENCOUNTER — Encounter (HOSPITAL_COMMUNITY): Payer: Self-pay | Admitting: Orthopaedic Surgery

## 2023-12-27 ENCOUNTER — Ambulatory Visit (HOSPITAL_COMMUNITY)

## 2023-12-27 DIAGNOSIS — I2510 Atherosclerotic heart disease of native coronary artery without angina pectoris: Secondary | ICD-10-CM | POA: Diagnosis not present

## 2023-12-27 DIAGNOSIS — Z9181 History of falling: Secondary | ICD-10-CM

## 2023-12-27 DIAGNOSIS — I10 Essential (primary) hypertension: Secondary | ICD-10-CM | POA: Diagnosis not present

## 2023-12-27 DIAGNOSIS — Z96652 Presence of left artificial knee joint: Secondary | ICD-10-CM | POA: Diagnosis present

## 2023-12-27 DIAGNOSIS — E785 Hyperlipidemia, unspecified: Secondary | ICD-10-CM

## 2023-12-27 DIAGNOSIS — Z96641 Presence of right artificial hip joint: Secondary | ICD-10-CM | POA: Diagnosis present

## 2023-12-27 DIAGNOSIS — K219 Gastro-esophageal reflux disease without esophagitis: Secondary | ICD-10-CM | POA: Diagnosis present

## 2023-12-27 DIAGNOSIS — Z79899 Other long term (current) drug therapy: Secondary | ICD-10-CM

## 2023-12-27 DIAGNOSIS — Z8249 Family history of ischemic heart disease and other diseases of the circulatory system: Secondary | ICD-10-CM

## 2023-12-27 DIAGNOSIS — M1612 Unilateral primary osteoarthritis, left hip: Secondary | ICD-10-CM | POA: Diagnosis not present

## 2023-12-27 DIAGNOSIS — M1712 Unilateral primary osteoarthritis, left knee: Secondary | ICD-10-CM | POA: Diagnosis present

## 2023-12-27 DIAGNOSIS — Z888 Allergy status to other drugs, medicaments and biological substances status: Secondary | ICD-10-CM

## 2023-12-27 DIAGNOSIS — Z853 Personal history of malignant neoplasm of breast: Secondary | ICD-10-CM

## 2023-12-27 DIAGNOSIS — I4819 Other persistent atrial fibrillation: Secondary | ICD-10-CM | POA: Diagnosis present

## 2023-12-27 DIAGNOSIS — Z955 Presence of coronary angioplasty implant and graft: Secondary | ICD-10-CM

## 2023-12-27 DIAGNOSIS — I252 Old myocardial infarction: Secondary | ICD-10-CM

## 2023-12-27 DIAGNOSIS — Z85828 Personal history of other malignant neoplasm of skin: Secondary | ICD-10-CM

## 2023-12-27 DIAGNOSIS — Z8701 Personal history of pneumonia (recurrent): Secondary | ICD-10-CM

## 2023-12-27 DIAGNOSIS — Z7901 Long term (current) use of anticoagulants: Secondary | ICD-10-CM

## 2023-12-27 DIAGNOSIS — R110 Nausea: Secondary | ICD-10-CM | POA: Diagnosis not present

## 2023-12-27 HISTORY — PX: TOTAL HIP ARTHROPLASTY: SHX124

## 2023-12-27 SURGERY — ARTHROPLASTY, HIP, TOTAL, ANTERIOR APPROACH
Anesthesia: Monitor Anesthesia Care | Site: Hip | Laterality: Left

## 2023-12-27 MED ORDER — POTASSIUM CHLORIDE CRYS ER 20 MEQ PO TBCR
40.0000 meq | EXTENDED_RELEASE_TABLET | Freq: Every day | ORAL | Status: DC
  Administered 2023-12-28 – 2023-12-29 (×2): 40 meq via ORAL
  Filled 2023-12-27 (×2): qty 2

## 2023-12-27 MED ORDER — FUROSEMIDE 40 MG PO TABS
40.0000 mg | ORAL_TABLET | Freq: Every day | ORAL | Status: DC
  Administered 2023-12-28 – 2023-12-29 (×2): 40 mg via ORAL
  Filled 2023-12-27 (×2): qty 1

## 2023-12-27 MED ORDER — EPHEDRINE 5 MG/ML INJ
INTRAVENOUS | Status: AC
  Filled 2023-12-27: qty 5

## 2023-12-27 MED ORDER — APIXABAN 5 MG PO TABS
5.0000 mg | ORAL_TABLET | Freq: Two times a day (BID) | ORAL | Status: DC
  Administered 2023-12-28 – 2023-12-29 (×3): 5 mg via ORAL
  Filled 2023-12-27 (×3): qty 1

## 2023-12-27 MED ORDER — ALBUMIN HUMAN 5 % IV SOLN
INTRAVENOUS | Status: AC
  Filled 2023-12-27: qty 250

## 2023-12-27 MED ORDER — ALUM & MAG HYDROXIDE-SIMETH 200-200-20 MG/5ML PO SUSP: 30.0000 mL | ORAL | Status: DC | PRN

## 2023-12-27 MED ORDER — ONDANSETRON HCL 4 MG/2ML IJ SOLN
INTRAMUSCULAR | Status: AC
  Filled 2023-12-27: qty 2

## 2023-12-27 MED ORDER — LACTATED RINGERS IV SOLN: INTRAVENOUS | Status: DC

## 2023-12-27 MED ORDER — CHLORHEXIDINE GLUCONATE 0.12 % MT SOLN
15.0000 mL | Freq: Once | OROMUCOSAL | Status: AC
  Administered 2023-12-27: 15 mL via OROMUCOSAL

## 2023-12-27 MED ORDER — MECLIZINE HCL 25 MG PO TABS
12.5000 mg | ORAL_TABLET | Freq: Every day | ORAL | Status: DC | PRN
  Administered 2023-12-28: 12.5 mg via ORAL
  Filled 2023-12-27: qty 1

## 2023-12-27 MED ORDER — ROSUVASTATIN CALCIUM 20 MG PO TABS
40.0000 mg | ORAL_TABLET | Freq: Every day | ORAL | Status: DC
  Administered 2023-12-27 – 2023-12-28 (×2): 40 mg via ORAL
  Filled 2023-12-27 (×3): qty 2

## 2023-12-27 MED ORDER — MORPHINE SULFATE (PF) 2 MG/ML IV SOLN
INTRAVENOUS | Status: AC
  Filled 2023-12-27: qty 1

## 2023-12-27 MED ORDER — 0.9 % SODIUM CHLORIDE (POUR BTL) OPTIME
TOPICAL | Status: DC | PRN
  Administered 2023-12-27: 1000 mL

## 2023-12-27 MED ORDER — DOFETILIDE 125 MCG PO CAPS
125.0000 ug | ORAL_CAPSULE | Freq: Two times a day (BID) | ORAL | Status: DC
  Administered 2023-12-27 – 2023-12-29 (×4): 125 ug via ORAL
  Filled 2023-12-27 (×4): qty 1

## 2023-12-27 MED ORDER — FENTANYL CITRATE (PF) 100 MCG/2ML IJ SOLN
INTRAMUSCULAR | Status: DC | PRN
  Administered 2023-12-27: 50 ug via INTRAVENOUS

## 2023-12-27 MED ORDER — PANTOPRAZOLE SODIUM 20 MG PO TBEC
20.0000 mg | DELAYED_RELEASE_TABLET | Freq: Every day | ORAL | Status: DC
  Administered 2023-12-28 – 2023-12-29 (×2): 20 mg via ORAL
  Filled 2023-12-27 (×2): qty 1

## 2023-12-27 MED ORDER — MORPHINE SULFATE (PF) 2 MG/ML IV SOLN
0.5000 mg | INTRAVENOUS | Status: DC | PRN
  Administered 2023-12-27: 1 mg via INTRAVENOUS
  Administered 2023-12-27: 0.5 mg via INTRAVENOUS
  Filled 2023-12-27: qty 1

## 2023-12-27 MED ORDER — METHOCARBAMOL 500 MG PO TABS
ORAL_TABLET | ORAL | Status: AC
  Filled 2023-12-27: qty 1

## 2023-12-27 MED ORDER — POVIDONE-IODINE 10 % EX SWAB: 2.0000 | Freq: Once | CUTANEOUS | Status: DC

## 2023-12-27 MED ORDER — PROMETHAZINE HCL 12.5 MG PO TABS: 12.5000 mg | ORAL_TABLET | Freq: Four times a day (QID) | ORAL | 0 refills | Status: AC | PRN

## 2023-12-27 MED ORDER — BUPIVACAINE IN DEXTROSE 0.75-8.25 % IT SOLN
INTRATHECAL | Status: DC | PRN
  Administered 2023-12-27: 1.6 mL via INTRATHECAL

## 2023-12-27 MED ORDER — METHOCARBAMOL 500 MG PO TABS
500.0000 mg | ORAL_TABLET | Freq: Four times a day (QID) | ORAL | Status: DC | PRN
  Administered 2023-12-27 (×2): 500 mg via ORAL
  Filled 2023-12-27: qty 1

## 2023-12-27 MED ORDER — BISACODYL 5 MG PO TBEC: 5.0000 mg | DELAYED_RELEASE_TABLET | Freq: Every day | ORAL | Status: DC | PRN

## 2023-12-27 MED ORDER — GABAPENTIN 300 MG PO CAPS
600.0000 mg | ORAL_CAPSULE | Freq: Three times a day (TID) | ORAL | Status: DC
  Administered 2023-12-27 – 2023-12-29 (×5): 600 mg via ORAL
  Filled 2023-12-27 (×5): qty 2

## 2023-12-27 MED ORDER — IRBESARTAN 150 MG PO TABS
300.0000 mg | ORAL_TABLET | Freq: Every day | ORAL | Status: DC
  Administered 2023-12-28 – 2023-12-29 (×2): 300 mg via ORAL
  Filled 2023-12-27 (×2): qty 2

## 2023-12-27 MED ORDER — HYDROCODONE-ACETAMINOPHEN 5-325 MG PO TABS
1.0000 | ORAL_TABLET | Freq: Four times a day (QID) | ORAL | 0 refills | Status: DC | PRN
Start: 2023-12-27 — End: 2024-12-26

## 2023-12-27 MED ORDER — ALBUMIN HUMAN 5 % IV SOLN: INTRAVENOUS | Status: DC | PRN

## 2023-12-27 MED ORDER — TRANEXAMIC ACID-NACL 1000-0.7 MG/100ML-% IV SOLN
1000.0000 mg | Freq: Once | INTRAVENOUS | Status: AC
  Administered 2023-12-27: 1000 mg via INTRAVENOUS
  Filled 2023-12-27: qty 100

## 2023-12-27 MED ORDER — HYDROCODONE-ACETAMINOPHEN 5-325 MG PO TABS: 1.0000 | ORAL_TABLET | ORAL | Status: DC | PRN

## 2023-12-27 MED ORDER — PHENYLEPHRINE HCL-NACL 20-0.9 MG/250ML-% IV SOLN
INTRAVENOUS | Status: DC | PRN
  Administered 2023-12-27: 30 ug/min via INTRAVENOUS

## 2023-12-27 MED ORDER — HYDROCODONE-ACETAMINOPHEN 7.5-325 MG PO TABS
1.0000 | ORAL_TABLET | ORAL | Status: DC | PRN
  Administered 2023-12-27: 1 via ORAL
  Administered 2023-12-27: 2 via ORAL
  Administered 2023-12-27: 1 via ORAL
  Administered 2023-12-28: 2 via ORAL
  Filled 2023-12-27: qty 2
  Filled 2023-12-27: qty 1
  Filled 2023-12-27: qty 2
  Filled 2023-12-27: qty 1

## 2023-12-27 MED ORDER — DIPHENHYDRAMINE HCL 12.5 MG/5ML PO ELIX: 12.5000 mg | ORAL_SOLUTION | ORAL | Status: DC | PRN

## 2023-12-27 MED ORDER — TRANEXAMIC ACID-NACL 1000-0.7 MG/100ML-% IV SOLN
1000.0000 mg | INTRAVENOUS | Status: AC
  Administered 2023-12-27: 1000 mg via INTRAVENOUS
  Filled 2023-12-27: qty 100

## 2023-12-27 MED ORDER — ACETAMINOPHEN 325 MG PO TABS: 325.0000 mg | ORAL_TABLET | Freq: Four times a day (QID) | ORAL | Status: DC | PRN

## 2023-12-27 MED ORDER — CEFAZOLIN SODIUM-DEXTROSE 2-4 GM/100ML-% IV SOLN
2.0000 g | Freq: Four times a day (QID) | INTRAVENOUS | Status: AC
  Administered 2023-12-27 (×2): 2 g via INTRAVENOUS
  Filled 2023-12-27 (×2): qty 100

## 2023-12-27 MED ORDER — DOCUSATE SODIUM 100 MG PO CAPS
100.0000 mg | ORAL_CAPSULE | Freq: Two times a day (BID) | ORAL | Status: DC
  Administered 2023-12-27 – 2023-12-29 (×4): 100 mg via ORAL
  Filled 2023-12-27 (×4): qty 1

## 2023-12-27 MED ORDER — METOCLOPRAMIDE HCL 5 MG PO TABS
5.0000 mg | ORAL_TABLET | Freq: Three times a day (TID) | ORAL | Status: DC | PRN
  Administered 2023-12-28: 10 mg via ORAL
  Filled 2023-12-27: qty 2

## 2023-12-27 MED ORDER — TIZANIDINE HCL 2 MG PO TABS: 2.0000 mg | ORAL_TABLET | Freq: Four times a day (QID) | ORAL | 1 refills | Status: DC | PRN

## 2023-12-27 MED ORDER — HYDRALAZINE HCL 25 MG PO TABS: 25.0000 mg | ORAL_TABLET | Freq: Two times a day (BID) | ORAL | Status: DC | PRN

## 2023-12-27 MED ORDER — HYDROCODONE-ACETAMINOPHEN 7.5-325 MG PO TABS
ORAL_TABLET | ORAL | Status: AC
  Filled 2023-12-27: qty 1

## 2023-12-27 MED ORDER — MAGNESIUM OXIDE -MG SUPPLEMENT 400 (240 MG) MG PO TABS
400.0000 mg | ORAL_TABLET | Freq: Every day | ORAL | Status: DC
  Administered 2023-12-28 – 2023-12-29 (×2): 400 mg via ORAL
  Filled 2023-12-27 (×2): qty 1

## 2023-12-27 MED ORDER — BUPIVACAINE-EPINEPHRINE (PF) 0.25% -1:200000 IJ SOLN
INTRAMUSCULAR | Status: AC
  Filled 2023-12-27: qty 30

## 2023-12-27 MED ORDER — STERILE WATER FOR IRRIGATION IR SOLN
Status: DC | PRN
Start: 2023-12-27 — End: 2023-12-27
  Administered 2023-12-27: 2000 mL

## 2023-12-27 MED ORDER — KETOROLAC TROMETHAMINE 15 MG/ML IJ SOLN
7.5000 mg | Freq: Four times a day (QID) | INTRAMUSCULAR | Status: AC
  Administered 2023-12-27 – 2023-12-28 (×4): 7.5 mg via INTRAVENOUS
  Filled 2023-12-27 (×4): qty 1

## 2023-12-27 MED ORDER — CEFAZOLIN SODIUM-DEXTROSE 2-4 GM/100ML-% IV SOLN
2.0000 g | INTRAVENOUS | Status: AC
  Administered 2023-12-27: 2 g via INTRAVENOUS
  Filled 2023-12-27: qty 100

## 2023-12-27 MED ORDER — DORZOLAMIDE HCL-TIMOLOL MAL 2-0.5 % OP SOLN
1.0000 [drp] | Freq: Two times a day (BID) | OPHTHALMIC | Status: DC
  Administered 2023-12-27 – 2023-12-29 (×4): 1 [drp] via OPHTHALMIC
  Filled 2023-12-27: qty 10

## 2023-12-27 MED ORDER — EPHEDRINE SULFATE-NACL 50-0.9 MG/10ML-% IV SOSY
PREFILLED_SYRINGE | INTRAVENOUS | Status: DC | PRN
  Administered 2023-12-27 (×2): 10 mg via INTRAVENOUS

## 2023-12-27 MED ORDER — FENTANYL CITRATE (PF) 50 MCG/ML IJ SOSY: 25.0000 ug | PREFILLED_SYRINGE | INTRAMUSCULAR | Status: DC | PRN

## 2023-12-27 MED ORDER — PROPOFOL 1000 MG/100ML IV EMUL
INTRAVENOUS | Status: AC
  Filled 2023-12-27: qty 100

## 2023-12-27 MED ORDER — POLYSACCHARIDE IRON COMPLEX 150 MG PO CAPS
150.0000 mg | ORAL_CAPSULE | Freq: Two times a day (BID) | ORAL | Status: DC
  Administered 2023-12-27 – 2023-12-29 (×4): 150 mg via ORAL
  Filled 2023-12-27 (×4): qty 1

## 2023-12-27 MED ORDER — PHENOL 1.4 % MT LIQD: 1.0000 | OROMUCOSAL | Status: DC | PRN

## 2023-12-27 MED ORDER — PROPOFOL 500 MG/50ML IV EMUL
INTRAVENOUS | Status: DC | PRN
  Administered 2023-12-27: 75 ug/kg/min via INTRAVENOUS

## 2023-12-27 MED ORDER — ACETAMINOPHEN 500 MG PO TABS
500.0000 mg | ORAL_TABLET | Freq: Four times a day (QID) | ORAL | Status: AC
  Administered 2023-12-27 – 2023-12-28 (×3): 500 mg via ORAL
  Filled 2023-12-27 (×3): qty 1

## 2023-12-27 MED ORDER — METOCLOPRAMIDE HCL 5 MG/ML IJ SOLN: 5.0000 mg | Freq: Three times a day (TID) | INTRAMUSCULAR | Status: DC | PRN

## 2023-12-27 MED ORDER — ACETAMINOPHEN 10 MG/ML IV SOLN: 1000.0000 mg | Freq: Once | INTRAVENOUS | Status: DC | PRN

## 2023-12-27 MED ORDER — SODIUM CHLORIDE (PF) 0.9 % IJ SOLN
INTRAMUSCULAR | Status: AC
  Filled 2023-12-27: qty 30

## 2023-12-27 MED ORDER — SODIUM CHLORIDE (PF) 0.9 % IJ SOLN
INTRAMUSCULAR | Status: DC | PRN
  Administered 2023-12-27: 80 mL

## 2023-12-27 MED ORDER — DROPERIDOL 2.5 MG/ML IJ SOLN: 0.6250 mg | Freq: Once | INTRAMUSCULAR | Status: DC | PRN

## 2023-12-27 MED ORDER — MENTHOL 3 MG MT LOZG: 1.0000 | LOZENGE | OROMUCOSAL | Status: DC | PRN

## 2023-12-27 MED ORDER — BUPIVACAINE LIPOSOME 1.3 % IJ SUSP
INTRAMUSCULAR | Status: AC
  Filled 2023-12-27: qty 20

## 2023-12-27 MED ORDER — ORAL CARE MOUTH RINSE: 15.0000 mL | Freq: Once | OROMUCOSAL | Status: AC

## 2023-12-27 MED ORDER — METHOCARBAMOL 1000 MG/10ML IJ SOLN: 500.0000 mg | Freq: Four times a day (QID) | INTRAMUSCULAR | Status: DC | PRN

## 2023-12-27 MED ORDER — TRANEXAMIC ACID 1000 MG/10ML IV SOLN
INTRAVENOUS | Status: DC | PRN
  Administered 2023-12-27: 2000 mg via TOPICAL

## 2023-12-27 MED ORDER — TRANEXAMIC ACID 1000 MG/10ML IV SOLN
2000.0000 mg | INTRAVENOUS | Status: DC
  Filled 2023-12-27: qty 20

## 2023-12-27 MED ORDER — ACETAMINOPHEN 10 MG/ML IV SOLN
INTRAVENOUS | Status: DC | PRN
  Administered 2023-12-27: 1000 mg via INTRAVENOUS

## 2023-12-27 MED ORDER — BUPIVACAINE LIPOSOME 1.3 % IJ SUSP: 10.0000 mL | Freq: Once | INTRAMUSCULAR | Status: DC

## 2023-12-27 MED ORDER — FENTANYL CITRATE (PF) 100 MCG/2ML IJ SOLN
INTRAMUSCULAR | Status: AC
  Filled 2023-12-27: qty 2

## 2023-12-27 SURGICAL SUPPLY — 42 items
BAG COUNTER SPONGE SURGICOUNT (BAG) IMPLANT
BAG DECANTER FOR FLEXI CONT (MISCELLANEOUS) ×1 IMPLANT
BLADE SAW SGTL 18X1.27X75 (BLADE) ×1 IMPLANT
BOOTIES KNEE HIGH SLOAN (MISCELLANEOUS) ×1 IMPLANT
COVER PERINEAL POST (MISCELLANEOUS) ×1 IMPLANT
COVER SURGICAL LIGHT HANDLE (MISCELLANEOUS) ×1 IMPLANT
CUP GRIPTON 48MM 100 HIP (Hips) IMPLANT
DRAPE FOOT SWITCH (DRAPES) ×1 IMPLANT
DRAPE IMP U-DRAPE 54X76 (DRAPES) ×1 IMPLANT
DRAPE STERI IOBAN 125X83 (DRAPES) ×1 IMPLANT
DRAPE U-SHAPE 47X51 STRL (DRAPES) ×1 IMPLANT
DRSG AQUACEL AG ADV 3.5X 6 (GAUZE/BANDAGES/DRESSINGS) ×1 IMPLANT
DURAPREP 26ML APPLICATOR (WOUND CARE) ×1 IMPLANT
ELECT PENCIL ROCKER SW 15FT (MISCELLANEOUS) ×1 IMPLANT
ELECT REM PT RETURN 15FT ADLT (MISCELLANEOUS) ×1 IMPLANT
ELIMINATOR HOLE APEX DEPUY (Hips) IMPLANT
GLOVE BIO SURGEON STRL SZ8 (GLOVE) ×2 IMPLANT
GLOVE BIOGEL PI IND STRL 7.0 (GLOVE) ×1 IMPLANT
GLOVE BIOGEL PI IND STRL 8 (GLOVE) ×2 IMPLANT
GLOVE SURG SYN 7.0 PF PI (GLOVE) ×1 IMPLANT
GOWN SRG XL LVL 4 BRTHBL STRL (GOWNS) ×1 IMPLANT
GOWN STRL REUS W/ TWL XL LVL3 (GOWN DISPOSABLE) ×2 IMPLANT
HEAD FEM STD 32X+1 STRL (Hips) IMPLANT
HOLDER FOLEY CATH W/STRAP (MISCELLANEOUS) ×1 IMPLANT
KIT TURNOVER KIT A (KITS) ×1 IMPLANT
LINER ACET 32X48 (Liner) IMPLANT
MANIFOLD NEPTUNE II (INSTRUMENTS) ×1 IMPLANT
NDL HYPO 22X1.5 SAFETY MO (MISCELLANEOUS) ×1 IMPLANT
NEEDLE HYPO 22X1.5 SAFETY MO (MISCELLANEOUS) ×1 IMPLANT
NS IRRIG 1000ML POUR BTL (IV SOLUTION) ×1 IMPLANT
PACK ANTERIOR HIP CUSTOM (KITS) ×1 IMPLANT
RETRACTOR WND ALEXIS 18 MED (MISCELLANEOUS) ×1 IMPLANT
SPIKE FLUID TRANSFER (MISCELLANEOUS) ×1 IMPLANT
STEM FEM ACTIS STD SZ2 (Stem) IMPLANT
SUT ETHIBOND NAB CT1 #1 30IN (SUTURE) ×2 IMPLANT
SUT VIC AB 1 CT1 36 (SUTURE) ×1 IMPLANT
SUT VIC AB 2-0 CT1 TAPERPNT 27 (SUTURE) ×1 IMPLANT
SUT VICRYL AB 3-0 FS1 BRD 27IN (SUTURE) ×1 IMPLANT
SUTURE STRATFX 0 PDS 27 VIOLET (SUTURE) ×1 IMPLANT
TRAY FOLEY MTR SLVR 14FR STAT (SET/KITS/TRAYS/PACK) IMPLANT
TRAY FOLEY MTR SLVR 16FR STAT (SET/KITS/TRAYS/PACK) ×1 IMPLANT
YANKAUER SUCT BULB TIP NO VENT (SUCTIONS) ×1 IMPLANT

## 2023-12-27 NOTE — Op Note (Signed)
 PRE-OP DIAGNOSIS:  LEFT HIP DEGENERATIVE JOINT DISEASE POST-OP DIAGNOSIS: same PROCEDURE:  LEFT TOTAL HIP ARTHROPLASTY ANTERIOR APPROACH ANESTHESIA:  Spinal and MAC SURGEON:  Maude Herald MD ASSISTANT:  Prentice Earl PA-C   INDICATIONS FOR PROCEDURE:  The patient is a 88 y.o. female with a long history of a painful hip.  This has persisted despite multiple conservative measures.  The patient has persisted with pain and dysfunction making rest and activity difficult.  A total hip replacement is offered as surgical treatment.  Informed operative consent was obtained after discussion of possible complications including reaction to anesthesia, infection, neurovascular injury, dislocation, DVT, PE, and death.  The importance of the postoperative rehab program to optimize result was stressed with the patient.  SUMMARY OF FINDINGS AND PROCEDURE:  Under the above anesthesia through a anterior approach an the Hana table a left THR was performed.  The patient had severe degenerative change and good bone quality.  We used DePuy components to replace the hip and these were size 2 Actis femur capped with a +1 32mm metal hip ball.  On the acetabular side we used a size 48 Gription shell with a plus 0 neutral polyethylene liner.  We did use a hole eliminator.  Prentice Earl PA-C assisted throughout and was invaluable to the completion of the case in that he helped position and retract while I performed the procedure.  He also closed simultaneously to help minimize OR time.  I used fluoroscopy throughout the case to check position of components and leg lengths and read all these views myself.  DESCRIPTION OF PROCEDURE:  The patient was taken to the OR suite where the above anesthetic was applied.  The patient was then positioned on the Hana table supine.  All bony prominences were appropriately padded.  Prep and drape was then performed in normal sterile fashion.  The patient was given kefzol  preoperative antibiotic and  an appropriate time out was performed.  We then took an anterior approach to the left hip.  Dissection was taken through adipose to the tensor fascia lata fascia.  This structure was incised longitudinally and we dissected in the intermuscular interval just medial to this muscle.  Cobra retractors were placed superior and inferior to the femoral neck superficial to the capsule.  A capsular incision was then made and the retractors were placed along the femoral neck.  Xray was brought in to get a good level for the femoral neck cut which was made with an oscillating saw and osteotome.  The femoral head was removed with a corkscrew.  The acetabulum was exposed and some labral tissues were excised. Reaming was taken to the inside wall of the pelvis and sequentially up to 1 mm smaller than the actual component.  A trial of components was done and then the aforementioned acetabular shell was placed in appropriate tilt and anteversion confirmed by fluoroscopy. The liner was placed along with the hole eliminator and attention was turned to the femur.  The leg was brought down and over into adduction and the elevator bar was used to raise the femur up gently in the wound.  The piriformis was released with care taken to preserve the obturator internus attachment and all of the posterior capsule. The femur was reamed and then broached to the appropriate size.  A trial reduction was done and the aforementioned head and neck assembly gave us  the best stability in extension with external rotation.  Leg lengths were felt to be about equal by fluoroscopic  exam.  The trial components were removed and the wound irrigated.  We then placed the femoral component in appropriate anteversion.  The head was applied to a dry stem neck and the hip again reduced.  It was again stable in the aforementioned position.  The would was irrigated again followed by re-approximation of anterior capsule with ethibond suture. Tensor fascia was repaired  with V-loc suture  followed by deep closure with #O and #2 undyed vicryl.  Skin was closed with subQ stitch and steristrips followed by a sterile dressing.  EBL and IOF can be obtained from anesthesia records.  DISPOSITION:  The patient was taken to PACU to potentially go home same day depending on ability to walk and tolerate liquids.

## 2023-12-27 NOTE — Interval H&P Note (Signed)
 History and Physical Interval Note:  12/27/2023 11:15 AM  Sheila Gardner  has presented today for surgery, with the diagnosis of LEFT HIP DEGENERATIVE  JOINT DISEASE.  The various methods of treatment have been discussed with the patient and family. After consideration of risks, benefits and other options for treatment, the patient has consented to  Procedure(s): ARTHROPLASTY, HIP, TOTAL, ANTERIOR APPROACH (Left) as a surgical intervention.  The patient's history has been reviewed, patient examined, no change in status, stable for surgery.  I have reviewed the patient's chart and labs.  Questions were answered to the patient's satisfaction.     Otto Caraway G Ellissa Ayo

## 2023-12-27 NOTE — Anesthesia Procedure Notes (Signed)
 Spinal  Patient location during procedure: OR Start time: 12/27/2023 12:10 PM End time: 12/27/2023 12:12 PM Staffing Performed: anesthesiologist  Anesthesiologist: Dorethea Cordella SQUIBB, DO Performed by: Dorethea Cordella SQUIBB, DO Authorized by: Dorethea Cordella SQUIBB, DO   Preanesthetic Checklist Completed: patient identified, IV checked, site marked, risks and benefits discussed, surgical consent, monitors and equipment checked, pre-op evaluation and timeout performed Spinal Block Patient position: sitting Prep: DuraPrep Patient monitoring: heart rate, cardiac monitor, continuous pulse ox and blood pressure Approach: midline Location: L3-4 Injection technique: single-shot Needle Needle type: Pencan  Needle gauge: 24 G Needle length: 10 cm Assessment Events: CSF return Additional Notes Patient identified. Risks/Benefits/Options discussed with patient including but not limited to bleeding, infection, nerve damage, paralysis, failed block, incomplete pain control, headache, blood pressure changes, nausea, vomiting, reactions to medications, itching and postpartum back pain. Confirmed with bedside nurse the patient's most recent platelet count. Confirmed with patient that they are not currently taking any anticoagulation, have any bleeding history or any family history of bleeding disorders. Patient expressed understanding and wished to proceed. All questions were answered. Sterile technique was used throughout the entire procedure. Please see nursing notes for vital signs. Warning signs of high block given to the patient including shortness of breath, tingling/numbness in hands, complete motor block, or any concerning symptoms with instructions to call for help. Patient was given instructions on fall risk and not to get out of bed. All questions and concerns addressed with instructions to call with any issues or inadequate analgesia.

## 2023-12-27 NOTE — Transfer of Care (Signed)
 Immediate Anesthesia Transfer of Care Note  Patient: Sheila Gardner  Procedure(s) Performed: ARTHROPLASTY, HIP, TOTAL, ANTERIOR APPROACH (Left: Hip)  Patient Location: PACU  Anesthesia Type:Spinal  Level of Consciousness: awake, alert , and oriented  Airway & Oxygen Therapy: Patient Spontanous Breathing  Post-op Assessment: Report given to RN and Post -op Vital signs reviewed and stable  Post vital signs: Reviewed and stable  Last Vitals:  Vitals Value Taken Time  BP 98/73 12/27/23 14:18  Temp    Pulse    Resp 10 12/27/23 14:20  SpO2    Vitals shown include unfiled device data.  Last Pain:  Vitals:   12/27/23 1016  PainSc: 0-No pain         Complications: No notable events documented.

## 2023-12-28 ENCOUNTER — Encounter (HOSPITAL_COMMUNITY): Payer: Self-pay | Admitting: Orthopaedic Surgery

## 2023-12-28 DIAGNOSIS — R110 Nausea: Secondary | ICD-10-CM | POA: Diagnosis not present

## 2023-12-28 DIAGNOSIS — Z96652 Presence of left artificial knee joint: Secondary | ICD-10-CM | POA: Diagnosis not present

## 2023-12-28 DIAGNOSIS — M1712 Unilateral primary osteoarthritis, left knee: Secondary | ICD-10-CM | POA: Diagnosis not present

## 2023-12-28 DIAGNOSIS — Z853 Personal history of malignant neoplasm of breast: Secondary | ICD-10-CM | POA: Diagnosis not present

## 2023-12-28 DIAGNOSIS — I252 Old myocardial infarction: Secondary | ICD-10-CM | POA: Diagnosis not present

## 2023-12-28 DIAGNOSIS — Z9181 History of falling: Secondary | ICD-10-CM | POA: Diagnosis not present

## 2023-12-28 DIAGNOSIS — Z8249 Family history of ischemic heart disease and other diseases of the circulatory system: Secondary | ICD-10-CM | POA: Diagnosis not present

## 2023-12-28 DIAGNOSIS — I4819 Other persistent atrial fibrillation: Secondary | ICD-10-CM | POA: Diagnosis not present

## 2023-12-28 DIAGNOSIS — M1612 Unilateral primary osteoarthritis, left hip: Secondary | ICD-10-CM | POA: Diagnosis not present

## 2023-12-28 DIAGNOSIS — Z888 Allergy status to other drugs, medicaments and biological substances status: Secondary | ICD-10-CM | POA: Diagnosis not present

## 2023-12-28 DIAGNOSIS — Z85828 Personal history of other malignant neoplasm of skin: Secondary | ICD-10-CM | POA: Diagnosis not present

## 2023-12-28 DIAGNOSIS — Z7901 Long term (current) use of anticoagulants: Secondary | ICD-10-CM | POA: Diagnosis not present

## 2023-12-28 DIAGNOSIS — E785 Hyperlipidemia, unspecified: Secondary | ICD-10-CM | POA: Diagnosis not present

## 2023-12-28 DIAGNOSIS — Z96641 Presence of right artificial hip joint: Secondary | ICD-10-CM | POA: Diagnosis not present

## 2023-12-28 DIAGNOSIS — K219 Gastro-esophageal reflux disease without esophagitis: Secondary | ICD-10-CM | POA: Diagnosis not present

## 2023-12-28 DIAGNOSIS — Z955 Presence of coronary angioplasty implant and graft: Secondary | ICD-10-CM | POA: Diagnosis not present

## 2023-12-28 DIAGNOSIS — I2510 Atherosclerotic heart disease of native coronary artery without angina pectoris: Secondary | ICD-10-CM | POA: Diagnosis not present

## 2023-12-28 DIAGNOSIS — Z8701 Personal history of pneumonia (recurrent): Secondary | ICD-10-CM | POA: Diagnosis not present

## 2023-12-28 DIAGNOSIS — I10 Essential (primary) hypertension: Secondary | ICD-10-CM | POA: Diagnosis not present

## 2023-12-28 DIAGNOSIS — Z79899 Other long term (current) drug therapy: Secondary | ICD-10-CM | POA: Diagnosis not present

## 2023-12-28 LAB — GLUCOSE, CAPILLARY: Glucose-Capillary: 156 mg/dL — ABNORMAL HIGH (ref 70–99)

## 2023-12-28 MED ORDER — PROMETHAZINE HCL 25 MG PO TABS: 12.5000 mg | ORAL_TABLET | Freq: Four times a day (QID) | ORAL | Status: DC | PRN

## 2023-12-28 MED ORDER — TRAMADOL HCL 50 MG PO TABS
50.0000 mg | ORAL_TABLET | Freq: Four times a day (QID) | ORAL | Status: DC | PRN
Start: 2023-12-28 — End: 2023-12-29
  Administered 2023-12-28 – 2023-12-29 (×3): 50 mg via ORAL
  Filled 2023-12-28 (×4): qty 1

## 2023-12-28 MED ORDER — SODIUM CHLORIDE 0.9 % IV BOLUS
500.0000 mL | Freq: Once | INTRAVENOUS | Status: AC
  Administered 2023-12-28: 500 mL via INTRAVENOUS

## 2023-12-28 NOTE — Progress Notes (Signed)
 Physical Therapy Treatment Patient Details Name: Sheila Gardner MRN: [id number] DOB: 04/26/1935 Today's Date: 12/28/2023   History of Present Illness Lattie Riege is an  yo female s/p L THA AA 10/21. MH includes but is not limited to: [facility], pacemaker, anemia, PAF, NSTEMI, GERD, syncope, CAD, HLD, HTN, OA, R ba ca, B ankle fx s/p surgery, and L TKA (2017), breast cancer, L TKA 11/04/15, R THA 07/30/14    PT Comments  Pt progressing well with PT. Pt needing min A to mobilize to bedside and then CGA to power up to stand and step pivot to [facility]. Pt able to complete pericare in standing with single UE support and no LOB, close supv for safety. Pt then amb 4 ft forward and requests to return to sitting due to pain. Pt tolerates seated exercises without improvement in pain reports, good muscle activation noted. Then pt reporting 2nd bladder void need so encouraged to amb to restroom which pt able to complete with CGA, again close supv for peri care in standing and then CGA to amb back to recliner. Encouraged pt to sit up in recliner as tolerable; TOC in room to discuss d/c at end of session. Pt denies dizziness and shortness of breath throughout session. Plan to progress mobility with plan to return home with family support at d/c.   If plan is discharge home, recommend the following: Assistance with cooking/housework;Assist for transportation;A little help with walking and/or transfers;A little help with bathing/dressing/bathroom   Can travel by private vehicle        Equipment Recommendations  Rolling walker (2 wheels)    Recommendations for Other Services       Precautions / Restrictions Precautions Precautions: Fall Restrictions Weight Bearing Restrictions Per Provider Order: No     Mobility  Bed Mobility Overal bed mobility: Needs Assistance Bed Mobility: Supine to Sit     Supine to sit: Min assist, HOB elevated, Used rails     General bed mobility comments: min A to power up to sitting  at bedside, most assist for LLE mobilizing    Transfers Overall transfer level: Needs assistance Equipment used: Rolling walker (2 wheels) Transfers: Sit to/from Stand Sit to Stand: Contact guard assist           General transfer comment: CGA to power up from EOB, recliner and BSC, cues each time for hand placement to push from seated surface and reach back prior to return to sittin    Ambulation/Gait Ambulation/Gait assistance: Contact guard assist Gait Distance (Feet): 15 Feet (x2) Assistive device: Rolling walker (2 wheels) Gait Pattern/deviations: Step-to pattern, Decreased stance time - left, Decreased weight shift to left, Antalgic, Trunk flexed Gait velocity: decreased     General Gait Details: trunk forward flexed with slow step-to gait pattern and decreased LLE stance time and weightbearing, pt amb to restroom and then back to recliner, no LE Buckling noted, limited by pain   Stairs             Wheelchair Mobility     Tilt Bed    Modified Rankin (Stroke Patients Only)       Balance Overall balance assessment: Needs assistance Sitting-balance support: Feet supported Sitting balance-Leahy Scale: Fair Sitting balance - Comments: not challenged   Standing balance support: Reliant on assistive device for balance, During functional activity, Bilateral upper extremity supported Standing balance-Leahy Scale: Poor  Communication Communication Communication: Impaired Factors Affecting Communication: Hearing impaired  Cognition Arousal: Alert Behavior During Therapy: WFL for tasks assessed/performed   PT - Cognitive impairments: No family/caregiver present to determine baseline                       PT - Cognition Comments: pt pleasant, continues to be slow to repsond to questions but appears appropriate, no family present Following commands: Impaired Following commands impaired: Follows one step  commands inconsistently, Follows one step commands with increased time    Cueing Cueing Techniques: Verbal cues, Gestural cues, Tactile cues, Visual cues  Exercises Total Joint Exercises Ankle Circles/Pumps: AROM, Both, 10 reps, Supine Quad Sets: AROM, Both, 10 reps, Seated Short Arc Quad: AROM, Left, 10 reps, Seated Heel Slides: AAROM, Left, 10 reps, Seated Hip ABduction/ADduction: AROM, Both, 5 reps, Supine Long Arc Quad: AROM, Both, 10 reps, Seated    General Comments        Pertinent Vitals/Pain Pain Assessment Pain Assessment: 0-10 Pain Score:  (7/10 at beginning, 5/10 at end of session) Pain Location: L hip Pain Descriptors / Indicators: Discomfort, Operative site guarding Pain Intervention(s): Limited activity within patient's tolerance, Monitored during session, Repositioned, Ice applied    Home Living                          Prior Function            PT Goals (current goals can now be found in the care plan section) Acute Rehab PT Goals Patient Stated Goal: return home PT Goal Formulation: With patient Time For Goal Achievement: 01/11/24 Potential to Achieve Goals: Good Progress towards PT goals: Progressing toward goals    Frequency    7X/week      PT Plan      Co-evaluation              AM-PAC PT 6 Clicks Mobility   Outcome Measure  Help needed turning from your back to your side while in a flat bed without using bedrails?: A Little Help needed moving from lying on your back to sitting on the side of a flat bed without using bedrails?: A Little Help needed moving to and from a bed to a chair (including a wheelchair)?: A Little Help needed standing up from a chair using your arms (e.g., wheelchair or bedside chair)?: A Little Help needed to walk in hospital room?: A Little Help needed climbing 3-5 steps with a railing? : A Lot 6 Click Score: 17    End of Session Equipment Utilized During Treatment: Gait belt Activity  Tolerance: Patient tolerated treatment well Patient left: in chair;with call bell/phone within reach;with chair alarm set;Other (comment) (TOC present) Nurse Communication: Mobility status PT Visit Diagnosis: Muscle weakness (generalized) (M62.81);Other abnormalities of gait and mobility (R26.89);Pain Pain - Right/Left: Left Pain - part of body: Hip     Time: 1455-1537 PT Time Calculation (min) (ACUTE ONLY): 42 min  Charges:    $Gait Training: 8-22 mins $Therapeutic Exercise: 8-22 mins $Therapeutic Activity: 8-22 mins PT General Charges $$ ACUTE PT VISIT: 1 Visit                     Tori Dhyana Bastone PT, DPT 12/28/23, 3:55 PM

## 2023-12-28 NOTE — TOC Transition Note (Signed)
 Transition of Care [facility]) - Discharge Note   Patient Details  Name: Sheila Gardner MRN: [id number] Date of Birth: [birth year]  Transition of Care [facility]) CM/SW Contact:  Alfonse JONELLE Rex, RN Phone Number: 12/28/2023, 3:47 PM   Clinical Narrative:   Met with patient at bedside to review dc therapy and home equipment needs, patient confirmed HH PT ([facility]), has RW, no home DME needs. MOON reviewed with patient, patient declined to sign, form completed by NCM, copy left at bedside. No TOC needs.     Final next level of care: Home w Home Health Services Barriers to Discharge: No Barriers Identified   Patient Goals and CMS Choice Patient states their goals for this hospitalization and ongoing recovery are:: return home          Discharge Placement                       Discharge Plan and Services Additional resources added to the After Visit Summary for                            [facility] Arranged: PT          Social Drivers of Health (SDOH) Interventions SDOH Screenings   Food Insecurity: No Food Insecurity (12/27/2023)  Housing: Low Risk  (12/27/2023)  Transportation Needs: No Transportation Needs (12/27/2023)  Utilities: Not At Risk (12/27/2023)  Depression (PHQ2-9): Low Risk  (07/03/2019)  Social Connections: Unknown (12/27/2023)  Tobacco Use: Low Risk  (12/27/2023)     Readmission Risk Interventions     No data to display

## 2023-12-28 NOTE — Care Management Obs Status (Signed)
 MEDICARE OBSERVATION STATUS NOTIFICATION   Patient Details  Name: Sheila Gardner MRN: [id number] Date of Birth: August 15, 1935   Medicare Observation Status Notification Given:       Alfonse JONELLE Rex, RN 12/28/2023, 3:45 PM

## 2023-12-28 NOTE — Progress Notes (Signed)
 Rapid Response Event Note   Reason for Call : pt c/o dizziness   Initial Focused Assessment: Pt currently A/O and f/c.  Denies pain. Pt states I felt dizzy when awakened but now it is gone.  Lung sounds clear.  Pt left hip dressing dry and intact (from hip sx).  Pt pulses intact.  VSS see flow sheet.  Interventions: Pt Primary, RN paged MD and awaiting response.  Meanwhile, pt denies dizziness and denies pain.    Plan of Care: Pt is not to get out of bed without help.  Informed Primary, RN to report to oncoming RN.  But if any further issues occur please call.    Event Summary:   MD Notified: awaiting response Call Time: 0534 Arrival Time: 0539 End Time: 0602  Iviona Hole Lavern, RN

## 2023-12-28 NOTE — Progress Notes (Signed)
 Pt rang RN station around 4 am c/o dizziness and nausea.  Charge RN Charmaine passed meclizine  and reglan  to patient.  RN Lonell checked back in on patient to evaluation PRN meds.  Pt stated she was woken up from sleep with dizziness.  Pt has a hx of afib and a pacemaker.  RN Lonell called Dalldorf's office and left a message with oncall for an EKG order.  After 15 minutes, there was no return call.  Rapid was called to evaluate patient, who was still feeling dizzy.  Urinary catheter to remain in place until rounds by physician.  RN Lonell was able to speak with PA rounding and notify PA of episode.

## 2023-12-28 NOTE — Anesthesia Postprocedure Evaluation (Signed)
 Anesthesia Post Note  Patient: Sheila Gardner  Procedure(s) Performed: ARTHROPLASTY, HIP, TOTAL, ANTERIOR APPROACH (Left: Hip)     Patient location during evaluation: PACU Anesthesia Type: MAC and Spinal Level of consciousness: awake and alert Pain management: pain level controlled Vital Signs Assessment: post-procedure vital signs reviewed and stable Respiratory status: spontaneous breathing, nonlabored ventilation, respiratory function stable and patient connected to nasal cannula oxygen Cardiovascular status: stable and blood pressure returned to baseline Postop Assessment: no apparent nausea or vomiting Anesthetic complications: no   No notable events documented.  Last Vitals:  Vitals:   12/28/23 0724 12/28/23 0948  BP: 114/66 120/60  Pulse: 60 78  Resp:    Temp:    SpO2: 97%     Last Pain:  Vitals:   12/28/23 1233  TempSrc:   PainSc: 10-Worst pain ever                 Cordella SQUIBB [name]

## 2023-12-28 NOTE — Progress Notes (Signed)
 Subjective: 1 Day Post-Op Procedure(s) (LRB): ARTHROPLASTY, HIP, TOTAL, ANTERIOR APPROACH (Left)  Patient has some dizziness this morning. She has a known history of this. It started after she received her narcotic pain pills during the night. The night time nurse called rapid response due to her history of a-fib but vitals were normal and fortunately rapid response team thought she was ok.   Activity level:  wbat Diet tolerance:  ok Voiding:  ok Patient reports pain as mild and moderate.    Objective: Vital signs in last 24 hours: Temp:  [96.4 F (35.8 C)-98.8 F (37.1 C)] 98.2 F (36.8 C) (10/22 0547) Pulse Rate:  [59-62] 60 (10/22 0724) Resp:  [10-20] 16 (10/22 0547) BP: (98-140)/(46-73) 114/66 (10/22 0724) SpO2:  [90 %-99 %] 97 % (10/22 0724) Weight:  [85.7 kg] 85.7 kg (10/21 1016)  Labs: No results for input(s): HGB in the last 72 hours. No results for input(s): WBC, RBC, HCT, PLT in the last 72 hours. No results for input(s): NA, K, CL, CO2, BUN, CREATININE, GLUCOSE, CALCIUM  in the last 72 hours. No results for input(s): LABPT, INR in the last 72 hours.  Physical Exam:  Neurologically intact ABD soft Neurovascular intact Sensation intact distally Intact pulses distally Dorsiflexion/Plantar flexion intact Incision: dressing C/D/I and no drainage No cellulitis present Compartment soft  Assessment/Plan:  1 Day Post-Op Procedure(s) (LRB): ARTHROPLASTY, HIP, TOTAL, ANTERIOR APPROACH (Left) Advance diet Up with therapy I will cancel norco and we will try tramadol  and tylenol  only for pain. I will add phenergan  for nausea and also give a 500cc bolus to try and help flush her system from the narcotics. We will see how she does with PT today and I am hopeful for D/C tomorrow.    Prentice Mt Renato Spellman 12/28/2023, 7:43 AM

## 2023-12-28 NOTE — Evaluation (Signed)
 Physical Therapy Evaluation Patient Details Name: Sheila Gardner MRN: [id number] DOB: 04-18-1935 Today's Date: 12/28/2023  History of Present Illness  [name] is an  yo female s/p L THA AA 10/21. MH includes but is not limited to: [facility], pacemaker, anemia, PAF, NSTEMI, GERD, syncope, CAD, HLD, HTN, OA, R ba ca, B ankle fx s/p surgery, and L TKA (2017), breast cancer, L TKA 11/04/15, R THA 07/30/14  Clinical Impression  Pt is s/p L THA AA resulting in the deficits listed below (see PT Problem List). Pt reports ind at baseline, using rollator walker, has 4 children who will rotate staying with her 24/7 postop. On eval, pt reports nausea and wheezing, noted to wheeze with bed mobility. Pt reports 0/10 pain at rest and 10/10 pain while transferring to bedside. Pt slow to mobilize to bedside, requiring mod A to mobilize LLE and upright trunk into sitting. Pt tolerates supine exercises with good muscle activation noted, doesn't appear to be in pain and denies when asked. Once sitting at bedside, therapist unable to obtain accurate SpO2 reading with poor waveform and variety of readings, notified RN. Pt seated at bedside with RN in room prepared to administer medication and d/c foley catheter. Plan to return and continue progressing mobility as able with plan to d/c home with family support. Pt will benefit from acute skilled PT to increase their independence and safety with mobility to allow discharge.          If plan is discharge home, recommend the following: Assistance with cooking/housework;Assist for transportation;A lot of help with walking and/or transfers;A lot of help with bathing/dressing/bathroom   Can travel by private vehicle        Equipment Recommendations Rolling walker (2 wheels)  Recommendations for Other Services       Functional Status Assessment Patient has had a recent decline in their functional status and demonstrates the ability to make significant improvements in function  in a reasonable and predictable amount of time.     Precautions / Restrictions Precautions Precautions: Fall Precaution/Restrictions Comments: monitor O2 Restrictions Weight Bearing Restrictions Per Provider Order: No      Mobility  Bed Mobility Overal bed mobility: Needs Assistance Bed Mobility: Supine to Sit     Supine to sit: Mod assist, HOB elevated, Used rails     General bed mobility comments: mod A to mobilize LLE to bedside and assist in uprighting trunk, slow to scoot foreward to place feet on floor    Transfers                   General transfer comment: RN in room assessing O2 and administering medication    Ambulation/Gait                  Stairs            Wheelchair Mobility     Tilt Bed    Modified Rankin (Stroke Patients Only)       Balance Overall balance assessment: Needs assistance Sitting-balance support: Feet supported Sitting balance-Leahy Scale: Fair Sitting balance - Comments: not challenged                                     Pertinent Vitals/Pain Pain Assessment Pain Assessment: 0-10 Pain Score:  (0/10 at rest, 10/10 wtih movement) Pain Location: L hip Pain Descriptors / Indicators: Discomfort, Operative site guarding Pain Intervention(s): Limited activity  within patient's tolerance, Monitored during session, Repositioned    Home Living Family/patient expects to be discharged to:: Private residence Living Arrangements: Alone Available Help at Discharge: Family;Available 24 hours/day Type of Home: House Home Access: Level entry       Home Layout: One level Home Equipment: Educational psychologist (4 wheels) Additional Comments: pt reports has 4 children who will be rotating to stay with her 24/7 for 2 weeks    Prior Function Prior Level of Function : Independent/Modified Independent             Mobility Comments: pt reports ind with rollator ADLs Comments: pt reports ind with  rollator     Extremity/Trunk Assessment   Upper Extremity Assessment Upper Extremity Assessment: Overall WFL for tasks assessed    Lower Extremity Assessment Lower Extremity Assessment: RLE deficits/detail;LLE deficits/detail RLE Deficits / Details: AROM WFL, strength grossly 3+/5 RLE Sensation:  (numb and tingling bottom of foot) RLE Coordination: WNL LLE Deficits / Details: ankle AROM WFL, able to perform supine heel slide ~45 deg, active quad set, unable to perform SLR, hip abd/add ~50% with increased time LLE Sensation:  (numb and tingling bottom of foot) LLE Coordination: WNL       Communication   Communication Communication: Impaired Factors Affecting Communication: Hearing impaired    Cognition Arousal: Alert Behavior During Therapy: WFL for tasks assessed/performed   PT - Cognitive impairments: No family/caregiver present to determine baseline                       PT - Cognition Comments: pt slow to respond to questions and cues, responding incorrectly to questions at times but unsure if [facility] or cog Following commands: Impaired Following commands impaired: Follows one step commands inconsistently, Follows one step commands with increased time     Cueing Cueing Techniques: Verbal cues, Gestural cues, Tactile cues, Visual cues     General Comments General comments (skin integrity, edema, etc.): on 2L but unable to obtain good waveform on pulse ox, SpO2 readings vary 62-90% and HR 30 to 130 - RN aware and in room    Exercises Total Joint Exercises Ankle Circles/Pumps: AROM, Both, 10 reps, Supine Quad Sets: AROM, Both, 5 reps, Supine Heel Slides: AROM, Both, 5 reps, Supine Hip ABduction/ADduction: AROM, Both, 5 reps, Supine   Assessment/Plan    PT Assessment Patient needs continued PT services  PT Problem List Decreased strength;Decreased activity tolerance;Decreased balance;Decreased mobility;Decreased knowledge of use of DME;Decreased safety  awareness;Decreased knowledge of precautions;Pain;Impaired sensation       PT Treatment Interventions DME instruction;Gait training;Functional mobility training;Therapeutic activities;Therapeutic exercise;Balance training;Cognitive remediation;Patient/family education;Manual techniques    PT Goals (Current goals can be found in the Care Plan section)  Acute Rehab PT Goals Patient Stated Goal: return home PT Goal Formulation: With patient Time For Goal Achievement: 01/11/24 Potential to Achieve Goals: Good    Frequency 7X/week     Co-evaluation               AM-PAC PT 6 Clicks Mobility  Outcome Measure Help needed turning from your back to your side while in a flat bed without using bedrails?: A Lot Help needed moving from lying on your back to sitting on the side of a flat bed without using bedrails?: A Lot Help needed moving to and from a bed to a chair (including a wheelchair)?: A Lot Help needed standing up from a chair using your arms (e.g., wheelchair or bedside chair)?: A Lot Help  needed to walk in hospital room?: Total Help needed climbing 3-5 steps with a railing? : Total 6 Click Score: 10    End of Session   Activity Tolerance: Patient tolerated treatment well Patient left: in chair;with call bell/phone within reach;with nursing/sitter in room (seated at bedside with RN in room) Nurse Communication: Mobility status;Other (comment) (SpO2) PT Visit Diagnosis: Muscle weakness (generalized) (M62.81);Other abnormalities of gait and mobility (R26.89);Pain Pain - Right/Left: Left Pain - part of body: Hip    Time: 0921-0951 PT Time Calculation (min) (ACUTE ONLY): 30 min   Charges:   PT Evaluation $PT Eval Low Complexity: 1 Low PT Treatments $Therapeutic Exercise: 8-22 mins PT General Charges $$ ACUTE PT VISIT: 1 Visit         Tori Kobe Ofallon PT, DPT 12/28/23, 10:00 AM

## 2023-12-29 MED ORDER — TRAMADOL HCL 50 MG PO TABS: 50.0000 mg | ORAL_TABLET | Freq: Four times a day (QID) | ORAL | 0 refills | Status: AC | PRN

## 2023-12-29 NOTE — Discharge Summary (Signed)
 Patient ID: Sheila Gardner MRN: [id number] DOB/AGE: [birth year] 86 y.o.  Admit date: 12/27/2023 Discharge date: 12/29/2023  Admission Diagnoses:  Principal Problem:   Primary localized osteoarthritis of left hip   Discharge Diagnoses:  Same  Past Medical History:  Diagnosis Date   Arthritis    Atrial fibrillation (HCC)    Breast cancer (HCC)    Cancer (HCC)    Breast- rt   Cough with sputum 2016   Elevated liver function tests 05/09/2013   GERD (gastroesophageal reflux disease)    COSTOCHONDRITIS   Heart murmur    Hip pain 05/08/2014   Hyperlipidemia    Hypertension    Lipoma    back of neck   Pneumonia    PONV (postoperative nausea and vomiting)    only after rt hip surgery   Primary osteoarthritis of left knee 11/04/2015   Primary osteoarthritis of right hip 07/30/2014   Squamous cell carcinoma of skin 08/21/2019   atypical squa. proliferation-Left corner of mouth    Surgeries: Procedure(s): ARTHROPLASTY, HIP, TOTAL, ANTERIOR APPROACH on 12/27/2023   Consultants:   Discharged Condition: Improved  Hospital Course: Sheila Gardner is an 88 y.o. female who was admitted 12/27/2023 for operative treatment ofPrimary localized osteoarthritis of left hip. Patient has severe unremitting pain that affects sleep, daily activities, and work/hobbies. After pre-op clearance the patient was taken to the operating room on 12/27/2023 and underwent  Procedure(s): ARTHROPLASTY, HIP, TOTAL, ANTERIOR APPROACH.    Patient was given perioperative antibiotics:  Anti-infectives (From admission, onward)    Start     Dose/Rate Route Frequency Ordered Stop   12/27/23 1800  ceFAZolin  (ANCEF ) IVPB 2g/100 mL premix        2 g 200 mL/hr over 30 Minutes Intravenous Every 6 hours 12/27/23 1640 12/28/23 0000   12/27/23 1000  ceFAZolin  (ANCEF ) IVPB 2g/100 mL premix        2 g 200 mL/hr over 30 Minutes Intravenous On call to O.R. 12/27/23 0947 12/27/23 1212        Patient was given  sequential compression devices, early ambulation, and chemoprophylaxis to prevent DVT.  Patient benefited maximally from hospital stay and there were no complications.    Recent vital signs: Patient Vitals for the past 24 hrs:  BP Temp Temp src Pulse Resp SpO2  12/29/23 0508 (!) 106/93 98.5 F (36.9 C) Oral 81 17 99 %  12/29/23 0031 (!) 101/53 100 F (37.8 C) Oral 63 18 95 %  12/28/23 2200 (!) 112/43 -- -- (!) 58 16 96 %  12/28/23 2148 -- -- -- (!) 59 16 99 %  12/28/23 2123 (!) 103/45 99.2 F (37.3 C) Oral (!) 59 18 (!) 84 %  12/28/23 1802 (!) 133/55 99.2 F (37.3 C) Oral 60 18 99 %  12/28/23 0948 120/60 -- -- 78 -- --  12/28/23 0724 114/66 -- Oral 60 -- 97 %     Recent laboratory studies: No results for input(s): WBC, HGB, HCT, PLT, NA, K, CL, CO2, BUN, CREATININE, GLUCOSE, INR, CALCIUM  in the last 72 hours.  Invalid input(s): PT, 2   Discharge Medications:   Allergies as of 12/29/2023       Reactions   Fenofibrate Other (See Comments)   elevated LFTs   Ondansetron  Other (See Comments)   drug interaction        Medication List     TAKE these medications    B-12 3000 MCG Subl Place under the tongue.   B-complex with vitamin C  tablet Take 1 tablet by mouth daily in the afternoon.   Biotin  5000 MCG Tabs Take 5,000 Units by mouth daily in the afternoon.   [employer] Probiotic Chew Chew 1 tablet by mouth daily in the afternoon.   dofetilide  125 MCG capsule Commonly known as: TIKOSYN  Take 1 capsule (125 mcg total) by mouth 2 (two) times daily.   dorzolamide-timolol 2-0.5 % ophthalmic solution Commonly known as: COSOPT Place 1 drop into both eyes 2 (two) times daily.   Eliquis  5 MG Tabs tablet Generic drug: apixaban  TAKE 1 TABLET BY MOUTH TWICE A DAY   ferrous sulfate  325 (65 FE) MG tablet Take 325 mg by mouth daily in the afternoon.   fluticasone  50 MCG/ACT nasal spray Commonly known as: FLONASE  Place 2 sprays into both nostrils  daily. What changed:  when to take this reasons to take this   Fruit & Vegetable Daily Caps Take 6 capsules by mouth daily in the afternoon. (3) Fruit + (3) Vegetable   furosemide  40 MG tablet Commonly known as: LASIX  Take 40 mg by mouth in the morning.   gabapentin  600 MG tablet Commonly known as: NEURONTIN  Take 600 mg by mouth 3 (three) times daily.   hydrALAZINE  25 MG tablet Commonly known as: APRESOLINE  TAKE 1 TABLET(25 MG) BY MOUTH IN THE MORNING AND AT BEDTIME What changed: See the new instructions.   hydrocortisone  cream 1 % Apply 1 Application topically daily as needed for itching (irritation). Ultra Moisturizing   iron  polysaccharides 150 MG capsule Commonly known as: NIFEREX Take 150 mg by mouth 2 (two) times daily.   lidocaine  4 % cream Commonly known as: LMX Apply 1 Application topically daily as needed (leg pain).   magnesium  oxide 400 MG tablet Commonly known as: MAG-OX Take 400 mg by mouth in the morning.   meclizine  12.5 MG tablet Commonly known as: ANTIVERT  Take 1 tablet (12.5 mg total) by mouth daily as needed for dizziness.   MEMORY COMPLEX BRAIN HEALTH PO Take 1 capsule by mouth daily in the afternoon.   OMEGA 3 500 PO Take 2 capsules by mouth daily. Multi omega 3   pantoprazole  20 MG tablet Commonly known as: PROTONIX  Take 20 mg by mouth in the morning.   potassium chloride  10 MEQ tablet Commonly known as: KLOR-CON  M Take 40 mEq by mouth in the morning.   PRESERVISION AREDS 2 PO Take 1 tablet by mouth in the morning.   promethazine  12.5 MG tablet Commonly known as: PHENERGAN  Take 1 tablet (12.5 mg total) by mouth every 6 (six) hours as needed for nausea or vomiting.   pyridOXINE  100 MG tablet Commonly known as: VITAMIN B6 Take 100 mg by mouth daily in the afternoon.   rosuvastatin  40 MG tablet Commonly known as: CRESTOR  Take 1 tablet (40 mg total) by mouth daily at 6 PM. What changed: when to take this   traMADol  50 MG  tablet Commonly known as: ULTRAM  Take 1 tablet (50 mg total) by mouth every 6 (six) hours as needed for severe pain (pain score 7-10) (post op pain).   valsartan  320 MG tablet Commonly known as: DIOVAN  Take 1 tablet (320 mg total) by mouth daily.   VITAMIN C PO Take 1,000 mg by mouth daily in the afternoon.   VITAMIN D -3 PO Take 2,000 Units by mouth daily in the afternoon.               Durable Medical Equipment  (From admission, onward)  Start     Ordered   12/27/23 1737  DME Walker rolling  Once       Question:  Patient needs a walker to treat with the following condition  Answer:  Primary osteoarthritis of left hip   12/27/23 1736   12/27/23 1737  DME 3 n 1  Once        12/27/23 1736   12/27/23 1737  DME Bedside commode  Once       Question:  Patient needs a bedside commode to treat with the following condition  Answer:  Primary osteoarthritis of left hip   12/27/23 1736            Diagnostic Studies: DG HIP UNILAT WITH PELVIS 1V LEFT Result Date: 12/27/2023 CLINICAL DATA:  Elective surgery. EXAM: DG HIP (WITH OR WITHOUT PELVIS) 1V*L* COMPARISON:  None Available. FINDINGS: Four fluoroscopic spot views of the pelvis and left hip obtained in the operating room. Sequential images during hip arthroplasty. Fluoroscopy time 12 seconds. Dose 1.56 mGy. Previous right hip arthroplasty. IMPRESSION: Intraoperative fluoroscopy during left hip arthroplasty. Electronically Signed   By: Andrea Gasman M.D.   On: 12/27/2023 14:04   DG C-Arm 1-60 Min-No Report Result Date: 12/27/2023 Fluoroscopy was utilized by the requesting physician.  No radiographic interpretation.   DG C-Arm 1-60 Min-No Report Result Date: 12/27/2023 Fluoroscopy was utilized by the requesting physician.  No radiographic interpretation.   VAS US  LOWER EXTREMITY VENOUS (DVT) Result Date: 12/22/2023  Lower Venous DVT Study Patient Name:  Sheila Gardner  Date of Exam:   12/22/2023 Medical Rec  #: [id number]      Accession #:    [id number] Date of Birth: 1935/08/20      Patient Gender: F Patient Age:   60 years Exam Location:  [address] Procedure:      VAS US  LOWER EXTREMITY VENOUS (DVT) Referring Phys: MAUDE DALLDORF --------------------------------------------------------------------------------  Indications: Left leg pain.  Risk Factors: Secondary hypercoagulable state Cancer Right breast. Limitations: Body habitus and patient positioning. Performing Technologist: Stoney Ross RVT  Examination Guidelines: A complete evaluation includes B-mode imaging, spectral Doppler, color Doppler, and power Doppler as needed of all accessible portions of each vessel. Bilateral testing is considered an integral part of a complete examination. Limited examinations for reoccurring indications may be performed as noted. The reflux portion of the exam is performed with the patient in reverse Trendelenburg.  +-----+---------------+---------+-----------+----------+--------------+ RIGHTCompressibilityPhasicitySpontaneityPropertiesThrombus Aging +-----+---------------+---------+-----------+----------+--------------+ CFV  Full           Yes      Yes                                 +-----+---------------+---------+-----------+----------+--------------+ SFJ  Full                    Yes                                 +-----+---------------+---------+-----------+----------+--------------+  +---------+---------------+---------+-----------+----------+--------------+ LEFT     CompressibilityPhasicitySpontaneityPropertiesThrombus Aging +---------+---------------+---------+-----------+----------+--------------+ CFV      Full           Yes      Yes                                 +---------+---------------+---------+-----------+----------+--------------+ SFJ      Full                                                         +---------+---------------+---------+-----------+----------+--------------+  FV Prox  Full           Yes      Yes                                 +---------+---------------+---------+-----------+----------+--------------+ FV Mid   Full           Yes      Yes                                 +---------+---------------+---------+-----------+----------+--------------+ FV DistalFull           Yes      Yes                                 +---------+---------------+---------+-----------+----------+--------------+ POP      Full           Yes      Yes                                 +---------+---------------+---------+-----------+----------+--------------+ PTV      Full                    Yes                                 +---------+---------------+---------+-----------+----------+--------------+ PERO     Full                    Yes                                 +---------+---------------+---------+-----------+----------+--------------+ GSV      Full                    Yes                                 +---------+---------------+---------+-----------+----------+--------------+ Patient's area of concern of left lateral calf demonstrated small varicosities.  Summary: RIGHT: - No evidence of common femoral vein obstruction.  LEFT: - There is no evidence of deep vein thrombosis in the lower extremity. - There is no evidence of superficial venous thrombosis.  - No cystic structure found in the popliteal fossa.  *See table(s) above for measurements and observations. Electronically signed by Fonda Rim on 12/22/2023 at 1:02:31 PM.    Final     Disposition: Discharge disposition: 01-Home or Self Care       Discharge Instructions     Call MD / Call 911   Complete by: As directed    If you experience chest pain or shortness of breath, CALL 911 and be transported to the hospital emergency room.  If you develope a fever above 101 F, pus (white drainage) or increased  drainage or redness at the wound, or calf pain, call your surgeon's office.   Constipation Prevention   Complete by: As directed    Drink plenty of fluids.  Prune juice may be helpful.  You may use a stool softener, such as Colace (over the counter) 100 mg twice a day.  Use MiraLax (over the counter) for constipation as needed.   Diet - low sodium heart healthy   Complete by: As directed    Discharge instructions   Complete by: As directed    INSTRUCTIONS AFTER JOINT REPLACEMENT   Remove items at home which could result in a fall. This includes throw rugs or furniture in walking pathways ICE to the affected joint every three hours while awake for 30 minutes at a time, for at least the first 3-5 days, and then as needed for pain and swelling.  Continue to use ice for pain and swelling. You may notice swelling that will progress down to the foot and ankle.  This is normal after surgery.  Elevate your leg when you are not up walking on it.   Continue to use the breathing machine you got in the hospital (incentive spirometer) which will help keep your temperature down.  It is common for your temperature to cycle up and down following surgery, especially at night when you are not up moving around and exerting yourself.  The breathing machine keeps your lungs expanded and your temperature down.   DIET:  As you were doing prior to hospitalization, we recommend a well-balanced diet.  DRESSING / WOUND CARE / SHOWERING  You may shower 3 days after surgery, but keep the wounds dry during showering.  You may use an occlusive plastic wrap (Press'n Seal for example), NO SOAKING/SUBMERGING IN THE BATHTUB.  If the bandage gets wet, change with a clean dry gauze.  If the incision gets wet, pat the wound dry with a clean towel.  ACTIVITY  Increase activity slowly as tolerated, but follow the weight bearing instructions below.   No driving for 6 weeks or until further direction given by your physician.  You  cannot drive while taking narcotics.  No lifting or carrying greater than 10 lbs. until further directed by your surgeon. Avoid periods of inactivity such as sitting longer than an hour when not asleep. This helps prevent blood clots.  You may return to work once you are authorized by your doctor.     WEIGHT BEARING   Weight bearing as tolerated with assist device (walker, cane, etc) as directed, use it as long as suggested by your surgeon or therapist, typically at least 4-6 weeks.   EXERCISES  Results after joint replacement surgery are often greatly improved when you follow the exercise, range of motion and muscle strengthening exercises prescribed by your doctor. Safety measures are also important to protect the joint from further injury. Any time any of these exercises cause you to have increased pain or swelling, decrease what you are doing until you are comfortable again and then slowly increase them. If you have problems or questions, call your caregiver or physical therapist for advice.   Rehabilitation is important following a joint replacement. After just a few days of immobilization, the muscles of the leg can become weakened and shrink (atrophy).  These exercises are designed to build up the tone and strength of the thigh and leg muscles and to improve motion. Often times heat used for twenty to thirty minutes before working out will loosen up your tissues and help with improving the range of motion but do not use heat for the first two weeks following surgery (sometimes heat can increase post-operative swelling).   These exercises can be done on a training (exercise) mat, on the floor, on a table or on a bed. Use whatever works the best and  is most comfortable for you.    Use music or television while you are exercising so that the exercises are a pleasant break in your day. This will make your life better with the exercises acting as a break in your routine that you can look forward  to.   Perform all exercises about fifteen times, three times per day or as directed.  You should exercise both the operative leg and the other leg as well.  Exercises include:   Quad Sets - Tighten up the muscle on the front of the thigh (Quad) and hold for 5-10 seconds.   Straight Leg Raises - With your knee straight (if you were given a brace, keep it on), lift the leg to 60 degrees, hold for 3 seconds, and slowly lower the leg.  Perform this exercise against resistance later as your leg gets stronger.  Leg Slides: Lying on your back, slowly slide your foot toward your buttocks, bending your knee up off the floor (only go as far as is comfortable). Then slowly slide your foot back down until your leg is flat on the floor again.  Angel Wings: Lying on your back spread your legs to the side as far apart as you can without causing discomfort.  Hamstring Strength:  Lying on your back, push your heel against the floor with your leg straight by tightening up the muscles of your buttocks.  Repeat, but this time bend your knee to a comfortable angle, and push your heel against the floor.  You may put a pillow under the heel to make it more comfortable if necessary.   A rehabilitation program following joint replacement surgery can speed recovery and prevent re-injury in the future due to weakened muscles. Contact your doctor or a physical therapist for more information on knee rehabilitation.    CONSTIPATION  Constipation is defined medically as fewer than three stools per week and severe constipation as less than one stool per week.  Even if you have a regular bowel pattern at home, your normal regimen is likely to be disrupted due to multiple reasons following surgery.  Combination of anesthesia, postoperative narcotics, change in appetite and fluid intake all can affect your bowels.   YOU MUST use at least one of the following options; they are listed in order of increasing strength to get the job  done.  They are all available over the counter, and you may need to use some, POSSIBLY even all of these options:    Drink plenty of fluids (prune juice may be helpful) and high fiber foods Colace 100 mg by mouth twice a day  Senokot for constipation as directed and as needed Dulcolax (bisacodyl ), take with full glass of water  Miralax (polyethylene glycol) once or twice a day as needed.  If you have tried all these things and are unable to have a bowel movement in the first 3-4 days after surgery call either your surgeon or your primary doctor.    If you experience loose stools or diarrhea, hold the medications until you stool forms back up.  If your symptoms do not get better within 1 week or if they get worse, check with your doctor.  If you experience the worst abdominal pain ever or develop nausea or vomiting, please contact the office immediately for further recommendations for treatment.   ITCHING:  If you experience itching with your medications, try taking only a single pain pill, or even half a pain pill at a time.  You can also use Benadryl  over the counter for itching or also to help with sleep.   TED HOSE STOCKINGS:  Use stockings on both legs until for at least 2 weeks or as directed by physician office. They may be removed at night for sleeping.  MEDICATIONS:  See your medication summary on the After Visit Summary that nursing will review with you.  You may have some home medications which will be placed on hold until you complete the course of blood thinner medication.  It is important for you to complete the blood thinner medication as prescribed.  PRECAUTIONS:  If you experience chest pain or shortness of breath - call 911 immediately for transfer to the hospital emergency department.   If you develop a fever greater that 101 F, purulent drainage from wound, increased redness or drainage from wound, foul odor from the wound/dressing, or calf pain - CONTACT YOUR SURGEON.                                                    FOLLOW-UP APPOINTMENTS:  If you do not already have a post-op appointment, please call the office for an appointment to be seen by your surgeon.  Guidelines for how soon to be seen are listed in your After Visit Summary, but are typically between 1-4 weeks after surgery.  OTHER INSTRUCTIONS:   Knee Replacement:  Do not place pillow under knee, focus on keeping the knee straight while resting. CPM instructions: 0-90 degrees, 2 hours in the morning, 2 hours in the afternoon, and 2 hours in the evening. Place foam block, curve side up under heel at all times except when in CPM or when walking.  DO NOT modify, tear, cut, or change the foam block in any way.  POST-OPERATIVE OPIOID TAPER INSTRUCTIONS: It is important to wean off of your opioid medication as soon as possible. If you do not need pain medication after your surgery it is ok to stop day one. Opioids include: Codeine, Hydrocodone (Norco, Vicodin), Oxycodone (Percocet, oxycontin ) and hydromorphone  amongst others.  Long term and even short term use of opiods can cause: Increased pain response Dependence Constipation Depression Respiratory depression And more.  Withdrawal symptoms can include Flu like symptoms Nausea, vomiting And more Techniques to manage these symptoms Hydrate well Eat regular healthy meals Stay active Use relaxation techniques(deep breathing, meditating, yoga) Do Not substitute Alcohol to help with tapering If you have been on opioids for less than two weeks and do not have pain than it is ok to stop all together.  Plan to wean off of opioids This plan should start within one week post op of your joint replacement. Maintain the same interval or time between taking each dose and first decrease the dose.  Cut the total daily intake of opioids by one tablet each day Next start to increase the time between doses. The last dose that should be eliminated is the evening  dose.     MAKE SURE YOU:  Understand these instructions.  Get help right away if you are not doing well or get worse.    Thank you for letting us  be a part of your medical care team.  It is a privilege we respect greatly.  We hope these instructions will help you stay on track for a fast and full recovery!   Increase activity  slowly as tolerated   Complete by: As directed    Post-operative opioid taper instructions:   Complete by: As directed    POST-OPERATIVE OPIOID TAPER INSTRUCTIONS: It is important to wean off of your opioid medication as soon as possible. If you do not need pain medication after your surgery it is ok to stop day one. Opioids include: Codeine, Hydrocodone (Norco, Vicodin), Oxycodone (Percocet, oxycontin ) and hydromorphone  amongst others.  Long term and even short term use of opiods can cause: Increased pain response Dependence Constipation Depression Respiratory depression And more.  Withdrawal symptoms can include Flu like symptoms Nausea, vomiting And more Techniques to manage these symptoms Hydrate well Eat regular healthy meals Stay active Use relaxation techniques(deep breathing, meditating, yoga) Do Not substitute Alcohol to help with tapering If you have been on opioids for less than two weeks and do not have pain than it is ok to stop all together.  Plan to wean off of opioids This plan should start within one week post op of your joint replacement. Maintain the same interval or time between taking each dose and first decrease the dose.  Cut the total daily intake of opioids by one tablet each day Next start to increase the time between doses. The last dose that should be eliminated is the evening dose.           Follow-up Information     Sheril Coy, MD. Go on 01/09/2024.   Specialty: Orthopedic Surgery Why: your appointment is scheduled for 10:15. Contact information: Sheila [address] [place] [address] [phone]          [facility] Follow up.   Why: HHPT/OT will provide 6 home vists prior to starting outpatient physical therapy        [employer] Go on 01/09/2024.   Why: your appointment is scheduled for 11:20. Please arrive at 11:00 to complete paperwork Contact information: Physical Therapy [address] [place] [address] [phone]                  Signed: Prentice Mt Kalisi Bevill 12/29/2023, 6:49 AM

## 2023-12-29 NOTE — Progress Notes (Signed)
 Physical Therapy Treatment Patient Details Name: Sheila Gardner MRN: [id number] DOB: 1935/05/28 Today's Date: 12/29/2023   History of Present Illness Sheila Gardner is an  yo female s/p L THA AA 10/21. MH includes but is not limited to: [facility], pacemaker, anemia, PAF, NSTEMI, GERD, syncope, CAD, HLD, HTN, OA, R ba ca, B ankle fx s/p surgery, and L TKA (2017), breast cancer, L TKA 11/04/15, R THA 07/30/14    PT Comments  Pt with significant cognitive improvement this session, responding appropriately and quickly to questions and commands, reports feeling more clear. Pt needing mod A to mobilize LLE to bedside and upright trunk into sitting and scoot forward to place feet on floor. Pt reports sleeping on couch at home so getting to bedside isn't as difficult. Cued pt for hand placement with transfers and pt able to demon sit to stands with RW and supv for safety. Pt amb 130 ft with RW, supv, step to quickly progressing to step through gait pattern, able to navigate around obstacles without LOB, no LE buckling noted. Pt conversational throughout, noted SpO2 96% at beginning of session and 97% post ambulation. Reviewed HEP and answered all questions; pt confirms HHPT scheduled. Pt again confirms no steps to enter apartment and children will alternate staying with her 24/7, reports ready to d/c home with family support.   If plan is discharge home, recommend the following: Assistance with cooking/housework;Assist for transportation;A little help with walking and/or transfers;A little help with bathing/dressing/bathroom   Can travel by private vehicle        Equipment Recommendations  Rolling walker (2 wheels)    Recommendations for Other Services       Precautions / Restrictions Precautions Precautions: Fall Restrictions Weight Bearing Restrictions Per Provider Order: No     Mobility  Bed Mobility Overal bed mobility: Needs Assistance Bed Mobility: Supine to Sit     Supine to sit: Mod assist,  HOB elevated, Used rails     General bed mobility comments: increased time and effort to mobilize to bedside, assist for LLE mobilizing to bedside and light assist and trunk to upright and scoot forward    Transfers Overall transfer level: Needs assistance Equipment used: Rolling walker (2 wheels) Transfers: Sit to/from Stand Sit to Stand: Supervision           General transfer comment: cues to push from seated surface to power up,    Ambulation/Gait Ambulation/Gait assistance: Supervision Gait Distance (Feet): 130 Feet Assistive device: Rolling walker (2 wheels) Gait Pattern/deviations: Decreased step length - left, Decreased stance time - right, Decreased stride length Gait velocity: decreased but functional     General Gait Details: step-to quickly progressing to step-through gait pattern, no LE buckling, able to compelte turns and navigate around obstacles without LOB or unsteadiness   Stairs             Wheelchair Mobility     Tilt Bed    Modified Rankin (Stroke Patients Only)       Balance Overall balance assessment: Needs assistance Sitting-balance support: Feet supported Sitting balance-Leahy Scale: Fair Sitting balance - Comments: not challenged   Standing balance support: Reliant on assistive device for balance, During functional activity, Bilateral upper extremity supported Standing balance-Leahy Scale: Poor                              Communication Communication Communication: Impaired Factors Affecting Communication: Hearing impaired (hearing aides)  Cognition Arousal:  Alert Behavior During Therapy: WFL for tasks assessed/performed   PT - Cognitive impairments: No apparent impairments                       PT - Cognition Comments: pt pleasant, responding appropriately and in appropriate amount of time, reports feeling more clear compared to yesterday Following commands: Intact      Cueing Cueing Techniques:  Verbal cues  Exercises Total Joint Exercises Long Arc Quad: AROM, Both, 10 reps, Seated    General Comments General comments (skin integrity, edema, etc.): on RA with SpO2 96% at beginning of session and 97% post ambulation      Pertinent Vitals/Pain Pain Assessment Pain Assessment: 0-10 Pain Score:  (6/10 at beginning, 8/10 at end of session) Pain Location: L hip Pain Descriptors / Indicators: Discomfort, Sore Pain Intervention(s): Limited activity within patient's tolerance, Monitored during session, Premedicated before session, Repositioned, Ice applied    Home Living                          Prior Function            PT Goals (current goals can now be found in the care plan section) Acute Rehab PT Goals Patient Stated Goal: return home PT Goal Formulation: With patient Time For Goal Achievement: 01/11/24 Potential to Achieve Goals: Good Progress towards PT goals: Progressing toward goals    Frequency    7X/week      PT Plan      Co-evaluation              AM-PAC PT 6 Clicks Mobility   Outcome Measure  Help needed turning from your back to your side while in a flat bed without using bedrails?: A Little Help needed moving from lying on your back to sitting on the side of a flat bed without using bedrails?: A Little Help needed moving to and from a bed to a chair (including a wheelchair)?: A Little Help needed standing up from a chair using your arms (e.g., wheelchair or bedside chair)?: A Little Help needed to walk in hospital room?: A Little Help needed climbing 3-5 steps with a railing? : A Lot 6 Click Score: 17    End of Session Equipment Utilized During Treatment: Gait belt Activity Tolerance: Patient tolerated treatment well Patient left: in chair;with call bell/phone within reach;with chair alarm set Nurse Communication: Mobility status PT Visit Diagnosis: Muscle weakness (generalized) (M62.81);Other abnormalities of gait and  mobility (R26.89);Pain Pain - Right/Left: Left Pain - part of body: Hip     Time: 9056-8989 PT Time Calculation (min) (ACUTE ONLY): 27 min  Charges:    $Gait Training: 8-22 mins $Therapeutic Activity: 8-22 mins PT General Charges $$ ACUTE PT VISIT: 1 Visit                     Tori Ouita Nish PT, DPT 12/29/23, 10:26 AM

## 2023-12-29 NOTE — Progress Notes (Signed)
 Subjective: 2 Days Post-Op Procedure(s) (LRB): ARTHROPLASTY, HIP, TOTAL, ANTERIOR APPROACH (Left)  Patient is feeling better this morning. She is hoping to pass PT and go home today.  Activity level:  wbat Diet tolerance:  ok Voiding:  ok Patient reports pain as mild.    Objective: Vital signs in last 24 hours: Temp:  [98.5 F (36.9 C)-100 F (37.8 C)] 98.5 F (36.9 C) (10/23 0508) Pulse Rate:  [58-81] 81 (10/23 0508) Resp:  [16-18] 17 (10/23 0508) BP: (101-133)/(43-93) 106/93 (10/23 0508) SpO2:  [84 %-99 %] 99 % (10/23 0508)  Labs: No results for input(s): HGB in the last 72 hours. No results for input(s): WBC, RBC, HCT, PLT in the last 72 hours. No results for input(s): NA, K, CL, CO2, BUN, CREATININE, GLUCOSE, CALCIUM  in the last 72 hours. No results for input(s): LABPT, INR in the last 72 hours.  Physical Exam:  Neurologically intact ABD soft Neurovascular intact Sensation intact distally Intact pulses distally Dorsiflexion/Plantar flexion intact Incision: dressing C/D/I and no drainage No cellulitis present Compartment soft  Assessment/Plan:  2 Days Post-Op Procedure(s) (LRB): ARTHROPLASTY, HIP, TOTAL, ANTERIOR APPROACH (Left) Advance diet Up with therapy Discharge home with home health today if cleared by PT and doing well. Follow up in office 2 weeks post op.  Continue tramadol  and tylenol  for pain Continue on baseline eliquis     Prentice Mt Cissy Galbreath 12/29/2023, 6:44 AM

## 2023-12-29 NOTE — Plan of Care (Signed)

## 2023-12-30 DIAGNOSIS — D6869 Other thrombophilia: Secondary | ICD-10-CM | POA: Diagnosis not present

## 2023-12-30 DIAGNOSIS — I4819 Other persistent atrial fibrillation: Secondary | ICD-10-CM | POA: Diagnosis not present

## 2023-12-30 DIAGNOSIS — D509 Iron deficiency anemia, unspecified: Secondary | ICD-10-CM | POA: Diagnosis not present

## 2023-12-30 DIAGNOSIS — Z853 Personal history of malignant neoplasm of breast: Secondary | ICD-10-CM | POA: Diagnosis not present

## 2023-12-30 DIAGNOSIS — M47816 Spondylosis without myelopathy or radiculopathy, lumbar region: Secondary | ICD-10-CM | POA: Diagnosis not present

## 2023-12-30 DIAGNOSIS — K8020 Calculus of gallbladder without cholecystitis without obstruction: Secondary | ICD-10-CM | POA: Diagnosis not present

## 2023-12-30 DIAGNOSIS — Z96652 Presence of left artificial knee joint: Secondary | ICD-10-CM | POA: Diagnosis not present

## 2023-12-30 DIAGNOSIS — E785 Hyperlipidemia, unspecified: Secondary | ICD-10-CM | POA: Diagnosis not present

## 2023-12-30 DIAGNOSIS — Z7901 Long term (current) use of anticoagulants: Secondary | ICD-10-CM | POA: Diagnosis not present

## 2023-12-30 DIAGNOSIS — I2510 Atherosclerotic heart disease of native coronary artery without angina pectoris: Secondary | ICD-10-CM | POA: Diagnosis not present

## 2023-12-30 DIAGNOSIS — I10 Essential (primary) hypertension: Secondary | ICD-10-CM | POA: Diagnosis not present

## 2023-12-30 DIAGNOSIS — Z79899 Other long term (current) drug therapy: Secondary | ICD-10-CM | POA: Diagnosis not present

## 2023-12-30 DIAGNOSIS — Z96643 Presence of artificial hip joint, bilateral: Secondary | ICD-10-CM | POA: Diagnosis not present

## 2023-12-30 DIAGNOSIS — Z79891 Long term (current) use of opiate analgesic: Secondary | ICD-10-CM | POA: Diagnosis not present

## 2023-12-30 DIAGNOSIS — M1612 Unilateral primary osteoarthritis, left hip: Secondary | ICD-10-CM | POA: Diagnosis not present

## 2023-12-30 DIAGNOSIS — Z604 Social exclusion and rejection: Secondary | ICD-10-CM | POA: Diagnosis not present

## 2024-01-02 DIAGNOSIS — M1612 Unilateral primary osteoarthritis, left hip: Secondary | ICD-10-CM | POA: Diagnosis not present

## 2024-01-03 DIAGNOSIS — M1612 Unilateral primary osteoarthritis, left hip: Secondary | ICD-10-CM | POA: Diagnosis not present

## 2024-01-04 DIAGNOSIS — M1612 Unilateral primary osteoarthritis, left hip: Secondary | ICD-10-CM | POA: Diagnosis not present

## 2024-01-05 DIAGNOSIS — M1612 Unilateral primary osteoarthritis, left hip: Secondary | ICD-10-CM | POA: Diagnosis not present

## 2024-01-06 DIAGNOSIS — M1612 Unilateral primary osteoarthritis, left hip: Secondary | ICD-10-CM | POA: Diagnosis not present

## 2024-01-09 DIAGNOSIS — M6281 Muscle weakness (generalized): Secondary | ICD-10-CM | POA: Diagnosis not present

## 2024-01-09 DIAGNOSIS — Z96642 Presence of left artificial hip joint: Secondary | ICD-10-CM | POA: Diagnosis not present

## 2024-01-09 DIAGNOSIS — M25652 Stiffness of left hip, not elsewhere classified: Secondary | ICD-10-CM | POA: Diagnosis not present

## 2024-01-11 DIAGNOSIS — M25652 Stiffness of left hip, not elsewhere classified: Secondary | ICD-10-CM | POA: Diagnosis not present

## 2024-01-11 DIAGNOSIS — M6281 Muscle weakness (generalized): Secondary | ICD-10-CM | POA: Diagnosis not present

## 2024-01-11 DIAGNOSIS — Z96642 Presence of left artificial hip joint: Secondary | ICD-10-CM | POA: Diagnosis not present

## 2024-01-12 ENCOUNTER — Ambulatory Visit: Payer: PPO

## 2024-01-12 DIAGNOSIS — I495 Sick sinus syndrome: Secondary | ICD-10-CM | POA: Diagnosis not present

## 2024-01-15 LAB — CUP PACEART REMOTE DEVICE CHECK
Battery Remaining Longevity: 107 mo
Battery Voltage: 3.02 V
Brady Statistic AP VP Percent: 90 %
Brady Statistic AP VS Percent: 0.25 %
Brady Statistic AS VP Percent: 5.29 %
Brady Statistic AS VS Percent: 4.47 %
Brady Statistic RA Percent Paced: 90.39 %
Brady Statistic RV Percent Paced: 95.29 %
Date Time Interrogation Session: 20251106091328
Implantable Lead Connection Status: 753985
Implantable Lead Connection Status: 753985
Implantable Lead Implant Date: 20230807
Implantable Lead Implant Date: 20230807
Implantable Lead Location: 753859
Implantable Lead Location: 753860
Implantable Lead Model: 3830
Implantable Lead Model: 5076
Implantable Pulse Generator Implant Date: 20230807
Lead Channel Impedance Value: 437 Ohm
Lead Channel Impedance Value: 513 Ohm
Lead Channel Impedance Value: 551 Ohm
Lead Channel Impedance Value: 646 Ohm
Lead Channel Pacing Threshold Amplitude: 0.625 V
Lead Channel Pacing Threshold Amplitude: 0.625 V
Lead Channel Pacing Threshold Pulse Width: 0.4 ms
Lead Channel Pacing Threshold Pulse Width: 0.4 ms
Lead Channel Sensing Intrinsic Amplitude: 18.75 mV
Lead Channel Sensing Intrinsic Amplitude: 18.75 mV
Lead Channel Sensing Intrinsic Amplitude: 3.625 mV
Lead Channel Sensing Intrinsic Amplitude: 3.625 mV
Lead Channel Setting Pacing Amplitude: 2 V
Lead Channel Setting Pacing Amplitude: 2.5 V
Lead Channel Setting Pacing Pulse Width: 0.4 ms
Lead Channel Setting Sensing Sensitivity: 1.2 mV
Zone Setting Status: 755011

## 2024-01-16 ENCOUNTER — Telehealth: Payer: Self-pay | Admitting: Cardiology

## 2024-01-16 ENCOUNTER — Ambulatory Visit: Payer: Self-pay | Admitting: Internal Medicine

## 2024-01-16 DIAGNOSIS — M25652 Stiffness of left hip, not elsewhere classified: Secondary | ICD-10-CM | POA: Diagnosis not present

## 2024-01-16 DIAGNOSIS — Z96642 Presence of left artificial hip joint: Secondary | ICD-10-CM | POA: Diagnosis not present

## 2024-01-16 DIAGNOSIS — M6281 Muscle weakness (generalized): Secondary | ICD-10-CM | POA: Diagnosis not present

## 2024-01-16 NOTE — Telephone Encounter (Signed)
 Pt c/o Shortness Of Breath: STAT if SOB developed within the last 24 hours or pt is noticeably SOB on the phone  1. Are you currently SOB (can you hear that pt is SOB on the phone)?   No  2. How long have you been experiencing SOB?   Ongoing since earlier this year but getting progressively worse  3. Are you SOB when sitting or when up moving around?   When up and moving around  4. Are you currently experiencing any other symptoms?  Occasionally lightheaded and dizzy  Patient is concerned her SOB has been getting progressively worse.

## 2024-01-16 NOTE — Telephone Encounter (Signed)
 Returned patient's call. Patient already has appointment with Dr. Jeffrie tomorrow morning. She says she has had worsening SOB on exertion the past few months. She had hip surgery 12/27/23 and feels her leg is moving well, but she gets SOB just going from room to room in her apartment where shew as able to ambulate in the halls before. Patient denies leg swelling, did not check her BP or HR today. Discussed ED precautions, patient verbalizes understanding to go to ED or call 911 if SOB worsens tonight and does not get better with rest or if she feels like she might pass out.

## 2024-01-17 ENCOUNTER — Encounter: Payer: Self-pay | Admitting: Cardiology

## 2024-01-17 ENCOUNTER — Ambulatory Visit: Attending: Cardiology | Admitting: Cardiology

## 2024-01-17 VITALS — BP 104/64 | HR 94 | Ht 64.5 in | Wt 183.0 lb

## 2024-01-17 DIAGNOSIS — I2510 Atherosclerotic heart disease of native coronary artery without angina pectoris: Secondary | ICD-10-CM

## 2024-01-17 DIAGNOSIS — I495 Sick sinus syndrome: Secondary | ICD-10-CM | POA: Diagnosis not present

## 2024-01-17 DIAGNOSIS — R011 Cardiac murmur, unspecified: Secondary | ICD-10-CM

## 2024-01-17 DIAGNOSIS — Z950 Presence of cardiac pacemaker: Secondary | ICD-10-CM

## 2024-01-17 DIAGNOSIS — R0602 Shortness of breath: Secondary | ICD-10-CM | POA: Diagnosis not present

## 2024-01-17 NOTE — Progress Notes (Signed)
 Remote PPM Transmission

## 2024-01-17 NOTE — Patient Instructions (Signed)
 Medication Instructions:  The current medical regimen is effective;  continue present plan and medications.  *If you need a refill on your cardiac medications before your next appointment, please call your pharmacy*  Testing/Procedures: Your physician has requested that you have an echocardiogram. Echocardiography is a painless test that uses sound waves to create images of your heart. It provides your doctor with information about the size and shape of your heart and how well your heart's chambers and valves are working. This procedure takes approximately one hour. There are no restrictions for this procedure. Please do NOT wear cologne, perfume, aftershave, or lotions (deodorant is allowed). Please arrive 15 minutes prior to your appointment time.  Please note: We ask at that you not bring children with you during ultrasound (echo/ vascular) testing. Due to room size and safety concerns, children are not allowed in the ultrasound rooms during exams. Our front office staff cannot provide observation of children in our lobby area while testing is being conducted. An adult accompanying a patient to their appointment will only be allowed in the ultrasound room at the discretion of the ultrasound technician under special circumstances. We apologize for any inconvenience.   Follow-Up: At [facility], you and your health needs are our priority.  As part of our continuing mission to provide you with exceptional heart care, our providers are all part of one team.  This team includes your primary Cardiologist (physician) and Advanced Practice Providers or APPs (Physician Assistants and Nurse Practitioners) who all work together to provide you with the care you need, when you need it.  Your next appointment:   1 year(s)  Provider:   One of our Advanced Practice Providers (APPs): Morse Clause, PA-C  Lamarr Satterfield, NP Miriam Shams, NP  Olivia Pavy, PA-C Josefa Beauvais, NP  Leontine Salen,  PA-C Orren Fabry, PA-C  Blackey, PA-C Ernest Dick, NP  Damien Braver, NP Jon Hails, PA-C  Waddell Donath, PA-C    Dayna Dunn, PA-C  Scott Weaver, PA-C Lum Louis, NP Katlyn West, NP Callie Goodrich, PA-C  Xika Zhao, NP Sheng Haley, PA-C    Kathleen Johnson, PA-C       We recommend signing up for the patient portal called MyChart.  Sign up information is provided on this After Visit Summary.  MyChart is used to connect with patients for Virtual Visits (Telemedicine).  Patients are able to view lab/test results, encounter notes, upcoming appointments, etc.  Non-urgent messages can be sent to your provider as well.   To learn more about what you can do with MyChart, go to [email or web address].

## 2024-01-17 NOTE — Progress Notes (Signed)
 Cardiology Office Note:  .   Date:  01/17/2024  ID:  Sheila Gardner, DOB [birth year], MRN [id number] PCP: Okey Carlin Redbird, MD  [facility] Providers Cardiologist:  Oneil Parchment, MD Electrophysiologist:  Danelle Birmingham, MD     History of Present Illness: .   Sheila Gardner is a 88 y.o. female Discussed the use of AI scribe  History of Present Illness Sheila Gardner is an 88 year old female with paroxysmal atrial fibrillation and coronary artery disease who presents with worsening shortness of breath.  She has been experiencing worsening shortness of breath over the past few months, which was present even before her recent hip surgery on December 27, 2023. She experiences significant dyspnea with minimal exertion, such as moving from room to room in her apartment. She describes feeling 'tired and very short spurts of activity' and can only stand in the kitchen for about ten minutes before needing to sit down.  She experiences dizziness upon standing, requiring her to pause and stabilize herself before moving. She mentions having 'diabetic moments' with dizziness and tingling in her legs, although diabetes was not explicitly confirmed.  Her past medical history includes paroxysmal atrial fibrillation, for which she is maintained on dofetilide . She underwent dual chamber pacemaker insertion on October 12, 2021, and a cardiac catheterization in October 2022 showed widely patent osteotoproximal LAD stent with some stenosis beyond the stented segment. She has mild to moderate left ventricular hypertrophy and grade one diastolic dysfunction. Her LDL cholesterol was previously noted to be 44. She takes Eliquis  5 mg twice a day for anticoagulation and reports staying well-hydrated, drinking a lot of water.  She reports a heart murmur and experiences her heart 'beating out of her chest' with exertion. She also reports dizziness and the need to sit down frequently due to shortness of  breath.     Studies Reviewed: .        Results DIAGNOSTIC Echocardiogram: Preserved left ventricular (LV) function, mild to moderate left ventricular hypertrophy, grade 1 diastolic dysfunction (11/22/2022)  Cardiac catheterization 2022: Widely patent osteoproximal left anterior descending (LAD) stent, 40-50% LAD stenosis beyond stented segment, 30% mid and mid distal stenosis, large circumflex with 50% stenosis after second obtuse marginal in atrioventricular (AV) groove, normal right ventricular artery (RVA), normal LV function, left ventricular end-diastolic pressure (LV EDP) 14 (October 2022)  Creatinine 0.73 potassium 4.2 hemoglobin 11.3 Risk Assessment/Calculations:            Physical Exam:   VS:  BP 104/64   Pulse 94   Ht 5' 4.5 (1.638 m)   Wt 183 lb (83 kg)   SpO2 92%   BMI 30.93 kg/m    Wt Readings from Last 3 Encounters:  01/17/24 183 lb (83 kg)  12/27/23 189 lb (85.7 kg)  12/16/23 189 lb (85.7 kg)    GEN: Well nourished, well developed in no acute distress NECK: No JVD; No carotid bruits CARDIAC: RRR, 2/6 SM, no rubs, no gallops RESPIRATORY:  Clear to auscultation without rales, wheezing or rhonchi  ABDOMEN: Soft, non-tender, non-distended EXTREMITIES:  No edema; No deformity   ASSESSMENT AND PLAN: .    Assessment and Plan Assessment & Plan Dyspnea with dizziness and presyncope Worsening dyspnea over the past few months, exacerbated by minimal activity. Dizziness and presyncope upon standing, likely related to deconditioning and possible orthostatic changes. Recent hip surgery may have contributed to decreased activity levels. Moderate left ventricular hypertrophy and coronary artery disease may contribute to symptoms.  Deconditioning is a significant factor, and gradual increase in physical activity is recommended. - Encouraged gradual increase in physical activity with the assistance of a trainer experienced in working with older adults. - Ordered  echocardiogram to assess heart function and valve status. - Advised on safe practices when standing to prevent dizziness and falls.  Paroxysmal atrial fibrillation status post dual chamber pacemaker implantation Paroxysmal atrial fibrillation managed with dofetilide  and dual chamber pacemaker. Pacemaker implantation on August 7th, 2023. Pacemaker is functioning well, and anticoagulation with Eliquis  is ongoing. - Continue dofetilide  and Eliquis  as prescribed. - Performed EKG to assess current cardiac rhythm.  Systolic heart murmur Presence of systolic heart murmur. Echocardiogram planned to evaluate heart valves and function. - Ordered echocardiogram to evaluate heart valves and function.  Left ventricular hypertrophy and coronary artery disease Moderate left ventricular hypertrophy and coronary artery disease with previous stenting. Recent cardiac catheterization showed patent stents and normal LV function. Symptoms of dyspnea may be related to these conditions. Gradual increase in physical activity is recommended to improve conditioning.  Her son knows a psychologist, educational that works with Geophysicist/field seismologist.  This will be valuable for her.  Start slow. - Encouraged gradual increase in physical activity to improve conditioning. - Ordered echocardiogram to assess current heart function and valve status given systolic murmur.  Prior echocardiogram did not show any evidence of aortic stenosis.  Continue with valsartan  320 mg a day, hydralazine  as needed        Dispo: 1 yr APP  Signed, Oneil Parchment, MD

## 2024-01-19 DIAGNOSIS — Z6833 Body mass index (BMI) 33.0-33.9, adult: Secondary | ICD-10-CM | POA: Diagnosis not present

## 2024-01-19 DIAGNOSIS — Z96642 Presence of left artificial hip joint: Secondary | ICD-10-CM | POA: Diagnosis not present

## 2024-01-19 DIAGNOSIS — Z23 Encounter for immunization: Secondary | ICD-10-CM | POA: Diagnosis not present

## 2024-01-19 DIAGNOSIS — M6281 Muscle weakness (generalized): Secondary | ICD-10-CM | POA: Diagnosis not present

## 2024-01-19 DIAGNOSIS — I10 Essential (primary) hypertension: Secondary | ICD-10-CM | POA: Diagnosis not present

## 2024-01-19 DIAGNOSIS — D649 Anemia, unspecified: Secondary | ICD-10-CM | POA: Diagnosis not present

## 2024-01-19 DIAGNOSIS — K219 Gastro-esophageal reflux disease without esophagitis: Secondary | ICD-10-CM | POA: Diagnosis not present

## 2024-01-19 DIAGNOSIS — E1121 Type 2 diabetes mellitus with diabetic nephropathy: Secondary | ICD-10-CM | POA: Diagnosis not present

## 2024-01-19 DIAGNOSIS — E782 Mixed hyperlipidemia: Secondary | ICD-10-CM | POA: Diagnosis not present

## 2024-01-19 DIAGNOSIS — M25652 Stiffness of left hip, not elsewhere classified: Secondary | ICD-10-CM | POA: Diagnosis not present

## 2024-01-19 DIAGNOSIS — Z0000 Encounter for general adult medical examination without abnormal findings: Secondary | ICD-10-CM | POA: Diagnosis not present

## 2024-01-19 DIAGNOSIS — G629 Polyneuropathy, unspecified: Secondary | ICD-10-CM | POA: Diagnosis not present

## 2024-01-19 DIAGNOSIS — E611 Iron deficiency: Secondary | ICD-10-CM | POA: Diagnosis not present

## 2024-01-23 DIAGNOSIS — M6281 Muscle weakness (generalized): Secondary | ICD-10-CM | POA: Diagnosis not present

## 2024-01-23 DIAGNOSIS — M25652 Stiffness of left hip, not elsewhere classified: Secondary | ICD-10-CM | POA: Diagnosis not present

## 2024-01-23 DIAGNOSIS — Z96642 Presence of left artificial hip joint: Secondary | ICD-10-CM | POA: Diagnosis not present

## 2024-01-26 DIAGNOSIS — M6281 Muscle weakness (generalized): Secondary | ICD-10-CM | POA: Diagnosis not present

## 2024-01-26 DIAGNOSIS — Z96642 Presence of left artificial hip joint: Secondary | ICD-10-CM | POA: Diagnosis not present

## 2024-01-26 DIAGNOSIS — M25652 Stiffness of left hip, not elsewhere classified: Secondary | ICD-10-CM | POA: Diagnosis not present

## 2024-01-30 DIAGNOSIS — M25652 Stiffness of left hip, not elsewhere classified: Secondary | ICD-10-CM | POA: Diagnosis not present

## 2024-01-30 DIAGNOSIS — Z96642 Presence of left artificial hip joint: Secondary | ICD-10-CM | POA: Diagnosis not present

## 2024-01-30 DIAGNOSIS — M6281 Muscle weakness (generalized): Secondary | ICD-10-CM | POA: Diagnosis not present

## 2024-02-01 DIAGNOSIS — Z96642 Presence of left artificial hip joint: Secondary | ICD-10-CM | POA: Diagnosis not present

## 2024-02-01 DIAGNOSIS — M25652 Stiffness of left hip, not elsewhere classified: Secondary | ICD-10-CM | POA: Diagnosis not present

## 2024-02-01 DIAGNOSIS — M6281 Muscle weakness (generalized): Secondary | ICD-10-CM | POA: Diagnosis not present

## 2024-02-22 ENCOUNTER — Ambulatory Visit: Attending: Family Medicine

## 2024-02-23 ENCOUNTER — Ambulatory Visit (HOSPITAL_COMMUNITY)
Admission: RE | Admit: 2024-02-23 | Discharge: 2024-02-23 | Attending: Cardiovascular Disease | Admitting: Cardiovascular Disease

## 2024-02-23 DIAGNOSIS — R0602 Shortness of breath: Secondary | ICD-10-CM | POA: Insufficient documentation

## 2024-02-23 DIAGNOSIS — R011 Cardiac murmur, unspecified: Secondary | ICD-10-CM | POA: Insufficient documentation

## 2024-02-23 DIAGNOSIS — R0600 Dyspnea, unspecified: Secondary | ICD-10-CM | POA: Diagnosis not present

## 2024-02-23 LAB — ECHOCARDIOGRAM COMPLETE
Area-P 1/2: 2.7 cm2
S' Lateral: 2.3 cm

## 2024-02-24 ENCOUNTER — Ambulatory Visit: Payer: Self-pay | Admitting: Cardiology

## 2024-03-09 ENCOUNTER — Other Ambulatory Visit: Payer: Self-pay | Admitting: Cardiology

## 2024-03-09 DIAGNOSIS — I480 Paroxysmal atrial fibrillation: Secondary | ICD-10-CM

## 2024-03-09 MED ORDER — APIXABAN 5 MG PO TABS: 5.0000 mg | ORAL_TABLET | Freq: Two times a day (BID) | ORAL | 1 refills | Status: AC

## 2024-03-09 NOTE — Telephone Encounter (Signed)
 Prescription refill request for Eliquis  received. Indication: a fib Last office visit: 01/17/24 Scr: 0.73 epic 12/16/23 Age: 89 Weight: 83 kg

## 2024-03-16 ENCOUNTER — Other Ambulatory Visit: Payer: Self-pay

## 2024-03-16 MED ORDER — DOFETILIDE 125 MCG PO CAPS: 125.0000 ug | ORAL_CAPSULE | Freq: Two times a day (BID) | ORAL | 0 refills | Status: AC

## 2024-03-19 ENCOUNTER — Other Ambulatory Visit: Payer: Self-pay | Admitting: Family Medicine

## 2024-03-19 DIAGNOSIS — Z1231 Encounter for screening mammogram for malignant neoplasm of breast: Secondary | ICD-10-CM

## 2024-04-12 ENCOUNTER — Ambulatory Visit: Payer: PPO

## 2024-04-13 LAB — CUP PACEART REMOTE DEVICE CHECK
Battery Remaining Longevity: 105 mo
Battery Voltage: 3.01 V
Brady Statistic AP VP Percent: 89.5 %
Brady Statistic AP VS Percent: 0.27 %
Brady Statistic AS VP Percent: 4.05 %
Brady Statistic AS VS Percent: 6.18 %
Brady Statistic RA Percent Paced: 89.87 %
Brady Statistic RV Percent Paced: 93.55 %
Date Time Interrogation Session: 20260204231622
Implantable Lead Connection Status: 753985
Implantable Lead Connection Status: 753985
Implantable Lead Implant Date: 20230807
Implantable Lead Implant Date: 20230807
Implantable Lead Location: 753859
Implantable Lead Location: 753860
Implantable Lead Model: 3830
Implantable Lead Model: 5076
Implantable Pulse Generator Implant Date: 20230807
Lead Channel Impedance Value: 418 Ohm
Lead Channel Impedance Value: 475 Ohm
Lead Channel Impedance Value: 570 Ohm
Lead Channel Impedance Value: 627 Ohm
Lead Channel Pacing Threshold Amplitude: 0.625 V
Lead Channel Pacing Threshold Amplitude: 0.625 V
Lead Channel Pacing Threshold Pulse Width: 0.4 ms
Lead Channel Pacing Threshold Pulse Width: 0.4 ms
Lead Channel Sensing Intrinsic Amplitude: 20.125 mV
Lead Channel Sensing Intrinsic Amplitude: 20.125 mV
Lead Channel Sensing Intrinsic Amplitude: 3.875 mV
Lead Channel Sensing Intrinsic Amplitude: 3.875 mV
Lead Channel Setting Pacing Amplitude: 2 V
Lead Channel Setting Pacing Amplitude: 2.5 V
Lead Channel Setting Pacing Pulse Width: 0.4 ms
Lead Channel Setting Sensing Sensitivity: 1.2 mV
Zone Setting Status: 755011

## 2024-06-19 ENCOUNTER — Ambulatory Visit

## 2024-07-12 ENCOUNTER — Ambulatory Visit

## 2024-10-11 ENCOUNTER — Ambulatory Visit

## 2025-01-10 ENCOUNTER — Ambulatory Visit

## 2025-04-11 ENCOUNTER — Ambulatory Visit

## 2025-07-11 ENCOUNTER — Ambulatory Visit

## 2025-10-10 ENCOUNTER — Ambulatory Visit
